# Patient Record
Sex: Female | Born: 1960 | Race: Black or African American | Hispanic: No | Marital: Single | Smoking: Never smoker
Health system: Southern US, Community
[De-identification: ages and names within clinical notes are randomized; demographics above are authoritative.]

## PROBLEM LIST (undated history)

## (undated) DIAGNOSIS — J9621 Acute and chronic respiratory failure with hypoxia: Secondary | ICD-10-CM

## (undated) DIAGNOSIS — Z992 Dependence on renal dialysis: Secondary | ICD-10-CM

## (undated) DIAGNOSIS — G589 Mononeuropathy, unspecified: Secondary | ICD-10-CM

## (undated) DIAGNOSIS — G4730 Sleep apnea, unspecified: Secondary | ICD-10-CM

## (undated) DIAGNOSIS — I10 Essential (primary) hypertension: Secondary | ICD-10-CM

## (undated) DIAGNOSIS — I469 Cardiac arrest, cause unspecified: Secondary | ICD-10-CM

## (undated) DIAGNOSIS — I739 Peripheral vascular disease, unspecified: Secondary | ICD-10-CM

## (undated) DIAGNOSIS — D649 Anemia, unspecified: Secondary | ICD-10-CM

## (undated) DIAGNOSIS — N289 Disorder of kidney and ureter, unspecified: Secondary | ICD-10-CM

## (undated) DIAGNOSIS — J189 Pneumonia, unspecified organism: Secondary | ICD-10-CM

## (undated) DIAGNOSIS — K219 Gastro-esophageal reflux disease without esophagitis: Secondary | ICD-10-CM

## (undated) DIAGNOSIS — J398 Other specified diseases of upper respiratory tract: Secondary | ICD-10-CM

## (undated) DIAGNOSIS — N186 End stage renal disease: Secondary | ICD-10-CM

## (undated) DIAGNOSIS — I5032 Chronic diastolic (congestive) heart failure: Secondary | ICD-10-CM

## (undated) DIAGNOSIS — J449 Chronic obstructive pulmonary disease, unspecified: Secondary | ICD-10-CM

## (undated) DIAGNOSIS — Z972 Presence of dental prosthetic device (complete) (partial): Secondary | ICD-10-CM

## (undated) DIAGNOSIS — I509 Heart failure, unspecified: Secondary | ICD-10-CM

## (undated) DIAGNOSIS — M1990 Unspecified osteoarthritis, unspecified site: Secondary | ICD-10-CM

## (undated) DIAGNOSIS — E785 Hyperlipidemia, unspecified: Secondary | ICD-10-CM

## (undated) DIAGNOSIS — Z9981 Dependence on supplemental oxygen: Secondary | ICD-10-CM

## (undated) HISTORY — DX: Anemia, unspecified: D64.9

## (undated) HISTORY — DX: Essential (primary) hypertension: I10

## (undated) HISTORY — PX: OVARY SURGERY: SHX727

## (undated) HISTORY — DX: Sleep apnea, unspecified: G47.30

## (undated) HISTORY — DX: Mononeuropathy, unspecified: G58.9

## (undated) HISTORY — DX: Unspecified osteoarthritis, unspecified site: M19.90

## (undated) HISTORY — DX: Heart failure, unspecified: I50.9

## (undated) HISTORY — DX: Hyperlipidemia, unspecified: E78.5

## (undated) HISTORY — DX: Peripheral vascular disease, unspecified: I73.9

## (undated) HISTORY — PX: MULTIPLE TOOTH EXTRACTIONS: SHX2053

## (undated) HISTORY — DX: Chronic obstructive pulmonary disease, unspecified: J44.9

## (undated) HISTORY — PX: COLONOSCOPY: SHX174

## (undated) HISTORY — PX: HAND SURGERY: SHX662

## (undated) HISTORY — DX: Morbid (severe) obesity due to excess calories: E66.01

## (undated) HISTORY — DX: Disorder of kidney and ureter, unspecified: N28.9

---

## 2009-05-07 ENCOUNTER — Emergency Department (HOSPITAL_COMMUNITY): Admission: EM | Admit: 2009-05-07 | Discharge: 2009-05-07 | Payer: Self-pay | Admitting: Family Medicine

## 2009-05-11 ENCOUNTER — Inpatient Hospital Stay (HOSPITAL_COMMUNITY): Admission: EM | Admit: 2009-05-11 | Discharge: 2009-05-14 | Payer: Self-pay | Admitting: Emergency Medicine

## 2009-05-12 ENCOUNTER — Encounter (INDEPENDENT_AMBULATORY_CARE_PROVIDER_SITE_OTHER): Payer: Self-pay | Admitting: Internal Medicine

## 2009-06-15 ENCOUNTER — Ambulatory Visit: Payer: Self-pay | Admitting: Internal Medicine

## 2009-07-06 ENCOUNTER — Ambulatory Visit: Payer: Self-pay | Admitting: *Deleted

## 2009-08-21 ENCOUNTER — Emergency Department (HOSPITAL_COMMUNITY): Admission: EM | Admit: 2009-08-21 | Discharge: 2009-08-21 | Payer: Self-pay | Admitting: Family Medicine

## 2009-08-21 ENCOUNTER — Emergency Department (HOSPITAL_COMMUNITY): Admission: EM | Admit: 2009-08-21 | Discharge: 2009-08-21 | Payer: Self-pay | Admitting: Emergency Medicine

## 2009-08-26 ENCOUNTER — Ambulatory Visit: Payer: Self-pay | Admitting: Internal Medicine

## 2009-09-01 ENCOUNTER — Ambulatory Visit (HOSPITAL_COMMUNITY): Admission: RE | Admit: 2009-09-01 | Discharge: 2009-09-01 | Payer: Self-pay | Admitting: Family Medicine

## 2009-09-16 ENCOUNTER — Ambulatory Visit: Payer: Self-pay | Admitting: Internal Medicine

## 2009-09-16 ENCOUNTER — Encounter: Payer: Self-pay | Admitting: Family Medicine

## 2009-09-16 LAB — CONVERTED CEMR LAB
ALT: 10 U/L
AST: 11 U/L
Albumin: 3.7 g/dL
Alkaline Phosphatase: 95 U/L
BUN: 18 mg/dL
CO2: 24 meq/L
Calcium: 9.4 mg/dL
Chloride: 100 meq/L
Cholesterol: 192 mg/dL
Creatinine, Ser: 1.44 mg/dL — ABNORMAL HIGH
Glucose, Bld: 320 mg/dL — ABNORMAL HIGH
HDL: 41 mg/dL
LDL Cholesterol: 123 mg/dL — ABNORMAL HIGH
Potassium: 4.6 meq/L
Sodium: 140 meq/L
Total Bilirubin: 0.4 mg/dL
Total CHOL/HDL Ratio: 4.7
Total Protein: 7 g/dL
Triglycerides: 139 mg/dL
VLDL: 28 mg/dL
Vit D, 25-Hydroxy: 13 ng/mL — ABNORMAL LOW

## 2009-09-25 ENCOUNTER — Ambulatory Visit: Payer: Self-pay | Admitting: Internal Medicine

## 2009-10-08 ENCOUNTER — Encounter (INDEPENDENT_AMBULATORY_CARE_PROVIDER_SITE_OTHER): Payer: Self-pay | Admitting: Adult Health

## 2009-10-08 ENCOUNTER — Ambulatory Visit: Payer: Self-pay | Admitting: Internal Medicine

## 2009-10-08 LAB — CONVERTED CEMR LAB
ALT: 10 units/L (ref 0–35)
AST: 12 units/L (ref 0–37)
Alkaline Phosphatase: 86 units/L (ref 39–117)
BUN: 20 mg/dL (ref 6–23)
CO2: 21 meq/L (ref 19–32)
Potassium: 4.5 meq/L (ref 3.5–5.3)
Total CHOL/HDL Ratio: 4.8
Total Protein: 6.9 g/dL (ref 6.0–8.3)
Triglycerides: 183 mg/dL — ABNORMAL HIGH (ref <150)

## 2009-10-23 ENCOUNTER — Encounter (INDEPENDENT_AMBULATORY_CARE_PROVIDER_SITE_OTHER): Payer: Self-pay | Admitting: Adult Health

## 2009-10-23 ENCOUNTER — Ambulatory Visit: Payer: Self-pay | Admitting: Internal Medicine

## 2009-10-30 ENCOUNTER — Encounter (INDEPENDENT_AMBULATORY_CARE_PROVIDER_SITE_OTHER): Payer: Self-pay | Admitting: Adult Health

## 2009-10-30 ENCOUNTER — Ambulatory Visit: Payer: Self-pay | Admitting: Family Medicine

## 2009-10-30 LAB — CONVERTED CEMR LAB
ALT: 12 units/L (ref 0–35)
AST: 12 units/L (ref 0–37)
Alkaline Phosphatase: 97 units/L (ref 39–117)
BUN: 21 mg/dL (ref 6–23)
Calcium: 9.4 mg/dL (ref 8.4–10.5)
Chloride: 103 meq/L (ref 96–112)
Creatinine, Ser: 1.31 mg/dL — ABNORMAL HIGH (ref 0.40–1.20)
Glucose, Bld: 251 mg/dL — ABNORMAL HIGH (ref 70–99)
Testosterone: 45.98 ng/dL (ref 10–70)
Total Bilirubin: 0.2 mg/dL — ABNORMAL LOW (ref 0.3–1.2)

## 2009-11-05 ENCOUNTER — Encounter (INDEPENDENT_AMBULATORY_CARE_PROVIDER_SITE_OTHER): Payer: Self-pay | Admitting: Adult Health

## 2009-11-05 ENCOUNTER — Ambulatory Visit: Payer: Self-pay | Admitting: Internal Medicine

## 2009-11-05 LAB — CONVERTED CEMR LAB
Albumin: 3.6 g/dL (ref 3.5–5.2)
Calcium, Total (PTH): 9 mg/dL (ref 8.4–10.5)
Calcium: 9 mg/dL (ref 8.4–10.5)
Creatinine, Ser: 1.46 mg/dL — ABNORMAL HIGH (ref 0.40–1.20)
Glucose, Bld: 294 mg/dL — ABNORMAL HIGH (ref 70–99)
PTH: 101.6 pg/mL — ABNORMAL HIGH (ref 14.0–72.0)
Phosphorus: 4.1 mg/dL (ref 2.3–4.6)
Sodium: 139 meq/L (ref 135–145)

## 2009-11-11 ENCOUNTER — Ambulatory Visit: Payer: Self-pay | Admitting: Internal Medicine

## 2009-12-16 ENCOUNTER — Ambulatory Visit: Payer: Self-pay | Admitting: Family Medicine

## 2009-12-16 ENCOUNTER — Encounter (INDEPENDENT_AMBULATORY_CARE_PROVIDER_SITE_OTHER): Payer: Self-pay | Admitting: Adult Health

## 2009-12-16 LAB — CONVERTED CEMR LAB
Albumin, U: DETECTED %
Albumin: 3.7 g/dL (ref 3.5–5.2)
Alpha 1, Urine: DETECTED % — AB
BUN: 18 mg/dL (ref 6–23)
Collection Interval-CRCL: 24 hr
Creatinine, Ser: 1.45 mg/dL — ABNORMAL HIGH (ref 0.40–1.20)
Free Lambda Lt Chains,Ur: 1.61 mg/dL — ABNORMAL HIGH (ref 0.08–1.01)
Pro B Natriuretic peptide (BNP): 17.3 pg/mL (ref 0.0–100.0)
Protein, Ur: 1980 mg/24hr — ABNORMAL HIGH (ref 50–100)
Time: 24
Total Protein, Urine-Ur/day: 1676 mg/24hr — ABNORMAL HIGH (ref 10–140)
Total Protein, Urine: 76.2 mg/dL
Volume, Urine: 2200 mL

## 2009-12-25 ENCOUNTER — Ambulatory Visit: Payer: Self-pay | Admitting: Internal Medicine

## 2009-12-25 ENCOUNTER — Encounter (INDEPENDENT_AMBULATORY_CARE_PROVIDER_SITE_OTHER): Payer: Self-pay | Admitting: Adult Health

## 2009-12-25 LAB — CONVERTED CEMR LAB
Albumin: 3.6 g/dL (ref 3.5–5.2)
Alpha-1-Globulin: 4.8 % (ref 2.9–4.9)
Anti Nuclear Antibody(ANA): NEGATIVE
BUN: 26 mg/dL — ABNORMAL HIGH (ref 6–23)
Basophils Relative: 0 % (ref 0–1)
Beta Globulin: 7.5 % — ABNORMAL HIGH (ref 4.7–7.2)
Calcium, Total (PTH): 8.9 mg/dL (ref 8.4–10.5)
Calcium: 8.9 mg/dL (ref 8.4–10.5)
Chloride: 102 meq/L (ref 96–112)
Eosinophils Absolute: 0.1 10*3/uL (ref 0.0–0.7)
Eosinophils Relative: 2 % (ref 0–5)
Hemoglobin: 11 g/dL — ABNORMAL LOW (ref 12.0–15.0)
IgA: 359 mg/dL (ref 68–378)
IgG (Immunoglobin G), Serum: 1190 mg/dL (ref 694–1618)
Iron: 39 ug/dL — ABNORMAL LOW (ref 42–145)
MCHC: 29.2 g/dL — ABNORMAL LOW (ref 30.0–36.0)
Monocytes Absolute: 0.4 10*3/uL (ref 0.1–1.0)
Neutrophils Relative %: 63 % (ref 43–77)
Platelets: 277 10*3/uL (ref 150–400)
Potassium: 4.2 meq/L (ref 3.5–5.3)
RBC: 4.63 M/uL (ref 3.87–5.11)
Saturation Ratios: 14 % — ABNORMAL LOW (ref 20–55)
TIBC: 278 ug/dL (ref 250–470)
UIBC: 239 ug/dL
ds DNA Ab: <1 (ref <5)

## 2010-01-06 ENCOUNTER — Encounter (HOSPITAL_COMMUNITY): Admission: RE | Admit: 2010-01-06 | Discharge: 2010-03-29 | Payer: Self-pay | Admitting: Nephrology

## 2010-01-21 ENCOUNTER — Emergency Department (HOSPITAL_COMMUNITY): Admission: EM | Admit: 2010-01-21 | Discharge: 2010-01-21 | Payer: Self-pay | Admitting: Family Medicine

## 2010-02-05 ENCOUNTER — Ambulatory Visit: Payer: Self-pay | Admitting: Internal Medicine

## 2010-03-19 ENCOUNTER — Ambulatory Visit: Payer: Self-pay | Admitting: Internal Medicine

## 2010-03-19 LAB — CONVERTED CEMR LAB
Basophils Absolute: 0 10*3/uL (ref 0.0–0.1)
Basophils Relative: 0 % (ref 0–1)
Calcium: 9.3 mg/dL (ref 8.4–10.5)
Chloride: 103 meq/L (ref 96–112)
Eosinophils Relative: 1 % (ref 0–5)
Ferritin: 172 ng/mL (ref 10–291)
Glucose, Bld: 238 mg/dL — ABNORMAL HIGH (ref 70–99)
MCHC: 28.6 g/dL — ABNORMAL LOW (ref 30.0–36.0)
Monocytes Absolute: 0.4 10*3/uL (ref 0.1–1.0)
Monocytes Relative: 5 % (ref 3–12)
Platelets: 256 10*3/uL (ref 150–400)
RBC: 4.43 M/uL (ref 3.87–5.11)
RDW: 16.9 % — ABNORMAL HIGH (ref 11.5–15.5)
Saturation Ratios: 18 % — ABNORMAL LOW (ref 20–55)
Sodium: 143 meq/L (ref 135–145)
TIBC: 268 ug/dL (ref 250–470)
UIBC: 221 ug/dL
WBC: 8.3 10*3/uL (ref 4.0–10.5)

## 2010-04-16 ENCOUNTER — Ambulatory Visit: Payer: Self-pay | Admitting: Internal Medicine

## 2010-06-16 ENCOUNTER — Ambulatory Visit: Payer: Self-pay | Admitting: Internal Medicine

## 2010-06-16 LAB — CONVERTED CEMR LAB
Basophils Absolute: 0 10*3/uL (ref 0.0–0.1)
Basophils Relative: 0 % (ref 0–1)
Eosinophils Absolute: 0.1 10*3/uL (ref 0.0–0.7)
Eosinophils Relative: 2 % (ref 0–5)
Hemoglobin: 11.2 g/dL — ABNORMAL LOW (ref 12.0–15.0)
Iron: 51 ug/dL (ref 42–145)
Lymphocytes Relative: 37 % (ref 12–46)
Lymphs Abs: 3.5 10*3/uL (ref 0.7–4.0)
MCHC: 30.1 g/dL (ref 30.0–36.0)
Neutro Abs: 5.3 10*3/uL (ref 1.7–7.7)
RBC: 4.57 M/uL (ref 3.87–5.11)
RDW: 17 % — ABNORMAL HIGH (ref 11.5–15.5)
Retic Ct Pct: 2 % (ref 0.4–3.1)
TIBC: 272 ug/dL (ref 250–470)
UIBC: 221 ug/dL

## 2010-07-15 ENCOUNTER — Ambulatory Visit: Payer: Self-pay | Admitting: Internal Medicine

## 2010-12-13 ENCOUNTER — Emergency Department (HOSPITAL_COMMUNITY)
Admission: EM | Admit: 2010-12-13 | Discharge: 2010-12-13 | Disposition: A | Discharge status: Other Acute Inpatient Hospital | Payer: Self-pay | Source: Home / Self Care | Admitting: Emergency Medicine

## 2010-12-13 ENCOUNTER — Inpatient Hospital Stay (HOSPITAL_COMMUNITY)
Admission: EM | Admit: 2010-12-13 | Discharge: 2010-12-17 | Payer: Self-pay | Source: Home / Self Care | Attending: Internal Medicine | Admitting: Internal Medicine

## 2010-12-14 ENCOUNTER — Encounter (INDEPENDENT_AMBULATORY_CARE_PROVIDER_SITE_OTHER): Payer: Self-pay | Admitting: Internal Medicine

## 2010-12-23 ENCOUNTER — Encounter (INDEPENDENT_AMBULATORY_CARE_PROVIDER_SITE_OTHER): Payer: Self-pay | Admitting: Family Medicine

## 2010-12-23 LAB — CONVERTED CEMR LAB
Albumin: 3.7 g/dL (ref 3.5–5.2)
Alkaline Phosphatase: 91 units/L (ref 39–117)
CO2: 34 meq/L — ABNORMAL HIGH (ref 19–32)
Calcium: 9.7 mg/dL (ref 8.4–10.5)
HDL: 32 mg/dL — ABNORMAL LOW (ref >39)
LDL Cholesterol: 182 mg/dL — ABNORMAL HIGH (ref 0–99)
Sodium: 141 meq/L (ref 135–145)
Total CHOL/HDL Ratio: 8
Total Protein: 7.3 g/dL (ref 6.0–8.3)
Triglycerides: 207 mg/dL — ABNORMAL HIGH (ref <150)
VLDL: 41 mg/dL — ABNORMAL HIGH (ref 0–40)

## 2011-01-31 ENCOUNTER — Encounter (INDEPENDENT_AMBULATORY_CARE_PROVIDER_SITE_OTHER): Payer: Self-pay | Admitting: *Deleted

## 2011-01-31 LAB — CONVERTED CEMR LAB
Calcium, Total (PTH): 9.4 mg/dL (ref 8.4–10.5)
Magnesium: 1.8 mg/dL (ref 1.5–2.5)
PTH: 99.8 pg/mL — ABNORMAL HIGH (ref 14.0–72.0)
Phosphorus: 3.9 mg/dL (ref 2.3–4.6)

## 2011-02-28 LAB — GLUCOSE, CAPILLARY
Glucose-Capillary: 158 mg/dL — ABNORMAL HIGH (ref 70–99)
Glucose-Capillary: 180 mg/dL — ABNORMAL HIGH (ref 70–99)
Glucose-Capillary: 193 mg/dL — ABNORMAL HIGH (ref 70–99)
Glucose-Capillary: 206 mg/dL — ABNORMAL HIGH (ref 70–99)
Glucose-Capillary: 211 mg/dL — ABNORMAL HIGH (ref 70–99)
Glucose-Capillary: 224 mg/dL — ABNORMAL HIGH (ref 70–99)
Glucose-Capillary: 228 mg/dL — ABNORMAL HIGH (ref 70–99)
Glucose-Capillary: 233 mg/dL — ABNORMAL HIGH (ref 70–99)

## 2011-02-28 LAB — COMPREHENSIVE METABOLIC PANEL
AST: 25 U/L (ref 0–37)
Albumin: 2.9 g/dL — ABNORMAL LOW (ref 3.5–5.2)
BUN: 16 mg/dL (ref 6–23)
CO2: 30 mEq/L (ref 19–32)
Calcium: 9 mg/dL (ref 8.4–10.5)
Chloride: 102 mEq/L (ref 96–112)
GFR calc Af Amer: 41 mL/min — ABNORMAL LOW (ref >60)
GFR calc non Af Amer: 34 mL/min — ABNORMAL LOW (ref >60)
Glucose, Bld: 217 mg/dL — ABNORMAL HIGH (ref 70–99)
Potassium: 3.5 mEq/L (ref 3.5–5.1)
Sodium: 140 mEq/L (ref 135–145)

## 2011-02-28 LAB — CARDIAC PANEL(CRET KIN+CKTOT+MB+TROPI)
CK, MB: 0.7 ng/mL (ref 0.3–4.0)
CK, MB: 0.7 ng/mL (ref 0.3–4.0)
CK, MB: 1.1 ng/mL (ref 0.3–4.0)
CK, MB: 1.2 ng/mL (ref 0.3–4.0)
Relative Index: INVALID (ref 0.0–2.5)
Relative Index: INVALID (ref 0.0–2.5)
Total CK: 79 U/L (ref 7–177)
Total CK: 80 U/L (ref 7–177)
Troponin I: 0.04 ng/mL (ref 0.00–0.06)

## 2011-02-28 LAB — CBC
Hemoglobin: 10.4 g/dL — ABNORMAL LOW (ref 12.0–15.0)
MCHC: 28.9 g/dL — ABNORMAL LOW (ref 30.0–36.0)
Platelets: 285 10*3/uL (ref 150–400)
RDW: 16.6 % — ABNORMAL HIGH (ref 11.5–15.5)

## 2011-02-28 LAB — CK TOTAL AND CKMB (NOT AT ARMC): CK, MB: 1 ng/mL (ref 0.3–4.0)

## 2011-02-28 LAB — BASIC METABOLIC PANEL
BUN: 20 mg/dL (ref 6–23)
Calcium: 9.2 mg/dL (ref 8.4–10.5)
Chloride: 98 mEq/L (ref 96–112)
GFR calc non Af Amer: 30 mL/min — ABNORMAL LOW (ref >60)
Potassium: 4.1 mEq/L (ref 3.5–5.1)
Potassium: 4.4 mEq/L (ref 3.5–5.1)
Sodium: 142 mEq/L (ref 135–145)

## 2011-02-28 LAB — TSH: TSH: 3.921 u[IU]/mL (ref 0.350–4.500)

## 2011-02-28 LAB — HEMOGLOBIN A1C: Hgb A1c MFr Bld: 10.5 % — ABNORMAL HIGH (ref <5.7)

## 2011-02-28 LAB — POCT I-STAT, CHEM 8
BUN: 19 mg/dL (ref 6–23)
Chloride: 105 mEq/L (ref 96–112)
Creatinine, Ser: 1.6 mg/dL — ABNORMAL HIGH (ref 0.4–1.2)
Hemoglobin: 12.6 g/dL (ref 12.0–15.0)
Sodium: 144 mEq/L (ref 135–145)

## 2011-03-09 LAB — GLUCOSE, CAPILLARY: Glucose-Capillary: 125 mg/dL — ABNORMAL HIGH (ref 70–99)

## 2011-03-25 LAB — CBC
HCT: 34 % — ABNORMAL LOW (ref 36.0–46.0)
MCV: 78.1 fL (ref 78.0–100.0)
Platelets: 246 10*3/uL (ref 150–400)
WBC: 9.3 10*3/uL (ref 4.0–10.5)

## 2011-03-25 LAB — COMPREHENSIVE METABOLIC PANEL
Albumin: 3 g/dL — ABNORMAL LOW (ref 3.5–5.2)
BUN: 15 mg/dL (ref 6–23)
Chloride: 100 mEq/L (ref 96–112)
Creatinine, Ser: 1.41 mg/dL — ABNORMAL HIGH (ref 0.4–1.2)
GFR calc non Af Amer: 39 mL/min — ABNORMAL LOW (ref >60)
Glucose, Bld: 221 mg/dL — ABNORMAL HIGH (ref 70–99)
Total Bilirubin: 0.6 mg/dL (ref 0.3–1.2)

## 2011-03-25 LAB — LIPASE, BLOOD: Lipase: 43 U/L (ref 11–59)

## 2011-03-25 LAB — DIFFERENTIAL
Basophils Absolute: 0 10*3/uL (ref 0.0–0.1)
Lymphocytes Relative: 23 % (ref 12–46)
Monocytes Absolute: 0.5 10*3/uL (ref 0.1–1.0)
Neutro Abs: 6.5 10*3/uL (ref 1.7–7.7)
Neutrophils Relative %: 70 % (ref 43–77)

## 2011-03-25 LAB — URINALYSIS, ROUTINE W REFLEX MICROSCOPIC
Leukocytes, UA: NEGATIVE
Nitrite: NEGATIVE
Specific Gravity, Urine: 1.024 (ref 1.005–1.030)
pH: 5.5 (ref 5.0–8.0)

## 2011-03-25 LAB — URINE MICROSCOPIC-ADD ON

## 2011-03-25 LAB — POCT CARDIAC MARKERS: Troponin i, poc: <0.05 ng/mL (ref 0.00–0.09)

## 2011-03-25 LAB — POCT PREGNANCY, URINE: Preg Test, Ur: NEGATIVE

## 2011-03-25 LAB — LACTIC ACID, PLASMA: Lactic Acid, Venous: 0.9 mmol/L (ref 0.5–2.2)

## 2011-03-29 LAB — FERRITIN: Ferritin: 135 ng/mL (ref 10–291)

## 2011-03-29 LAB — URINALYSIS, ROUTINE W REFLEX MICROSCOPIC
Leukocytes, UA: NEGATIVE
Protein, ur: 100 mg/dL — AB
Urobilinogen, UA: 0.2 mg/dL (ref 0.0–1.0)

## 2011-03-29 LAB — RETICULOCYTES
RBC.: 4.39 MIL/uL (ref 3.87–5.11)
Retic Ct Pct: 1.8 % (ref 0.4–3.1)

## 2011-03-29 LAB — BRAIN NATRIURETIC PEPTIDE: Pro B Natriuretic peptide (BNP): <30 pg/mL (ref 0.0–100.0)

## 2011-03-29 LAB — DIFFERENTIAL
Basophils Absolute: 0 10*3/uL (ref 0.0–0.1)
Basophils Relative: 0 % (ref 0–1)
Monocytes Relative: 5 % (ref 3–12)
Neutro Abs: 5.3 10*3/uL (ref 1.7–7.7)
Neutrophils Relative %: 62 % (ref 43–77)

## 2011-03-29 LAB — COMPREHENSIVE METABOLIC PANEL
Alkaline Phosphatase: 100 U/L (ref 39–117)
BUN: 17 mg/dL (ref 6–23)
CO2: 29 mEq/L (ref 19–32)
GFR calc non Af Amer: 39 mL/min — ABNORMAL LOW (ref >60)
Glucose, Bld: 509 mg/dL (ref 70–99)
Potassium: 4.4 mEq/L (ref 3.5–5.1)
Total Bilirubin: 0.1 mg/dL — ABNORMAL LOW (ref 0.3–1.2)
Total Protein: 6.7 g/dL (ref 6.0–8.3)

## 2011-03-29 LAB — BASIC METABOLIC PANEL
CO2: 30 mEq/L (ref 19–32)
Calcium: 9.4 mg/dL (ref 8.4–10.5)
Creatinine, Ser: 1.27 mg/dL — ABNORMAL HIGH (ref 0.4–1.2)
GFR calc Af Amer: 54 mL/min — ABNORMAL LOW (ref >60)
Glucose, Bld: 284 mg/dL — ABNORMAL HIGH (ref 70–99)

## 2011-03-29 LAB — CK TOTAL AND CKMB (NOT AT ARMC)
CK, MB: 1 ng/mL (ref 0.3–4.0)
Relative Index: INVALID (ref 0.0–2.5)
Relative Index: INVALID (ref 0.0–2.5)
Total CK: 61 U/L (ref 7–177)
Total CK: 74 U/L (ref 7–177)

## 2011-03-29 LAB — GLUCOSE, CAPILLARY
Glucose-Capillary: 174 mg/dL — ABNORMAL HIGH (ref 70–99)
Glucose-Capillary: 186 mg/dL — ABNORMAL HIGH (ref 70–99)
Glucose-Capillary: 189 mg/dL — ABNORMAL HIGH (ref 70–99)
Glucose-Capillary: 190 mg/dL — ABNORMAL HIGH (ref 70–99)
Glucose-Capillary: 191 mg/dL — ABNORMAL HIGH (ref 70–99)
Glucose-Capillary: 226 mg/dL — ABNORMAL HIGH (ref 70–99)
Glucose-Capillary: 262 mg/dL — ABNORMAL HIGH (ref 70–99)
Glucose-Capillary: 296 mg/dL — ABNORMAL HIGH (ref 70–99)
Glucose-Capillary: 296 mg/dL — ABNORMAL HIGH (ref 70–99)
Glucose-Capillary: 473 mg/dL — ABNORMAL HIGH (ref 70–99)

## 2011-03-29 LAB — CBC
HCT: 34.4 % — ABNORMAL LOW (ref 36.0–46.0)
Hemoglobin: 11 g/dL — ABNORMAL LOW (ref 12.0–15.0)
MCHC: 32.6 g/dL (ref 30.0–36.0)
RBC: 4.14 MIL/uL (ref 3.87–5.11)
RDW: 15.1 % (ref 11.5–15.5)
RDW: 15.1 % (ref 11.5–15.5)

## 2011-03-29 LAB — TROPONIN I
Troponin I: 0.02 ng/mL (ref 0.00–0.06)
Troponin I: 0.09 ng/mL — ABNORMAL HIGH (ref 0.00–0.06)
Troponin I: 0.13 ng/mL — ABNORMAL HIGH (ref 0.00–0.06)

## 2011-03-29 LAB — IRON AND TIBC
Iron: 40 ug/dL — ABNORMAL LOW (ref 42–135)
TIBC: 275 ug/dL (ref 250–470)
UIBC: 229 ug/dL

## 2011-03-29 LAB — T4, FREE: Free T4: 0.82 ng/dL (ref 0.80–1.80)

## 2011-03-29 LAB — LIPID PANEL: Cholesterol: 209 mg/dL — ABNORMAL HIGH (ref 0–200)

## 2011-03-29 LAB — HEMOGLOBIN A1C
Hgb A1c MFr Bld: 12 % — ABNORMAL HIGH (ref 4.6–6.1)
Mean Plasma Glucose: 298 mg/dL

## 2011-03-29 LAB — URINE MICROSCOPIC-ADD ON

## 2011-03-29 LAB — APTT: aPTT: 27 seconds (ref 24–37)

## 2011-04-05 ENCOUNTER — Encounter: Payer: Self-pay | Admitting: Pulmonary Disease

## 2011-04-05 ENCOUNTER — Ambulatory Visit (INDEPENDENT_AMBULATORY_CARE_PROVIDER_SITE_OTHER): Payer: Self-pay | Admitting: Pulmonary Disease

## 2011-04-05 VITALS — BP 152/88 | HR 73 | Temp 98.8°F | Ht 62.0 in | Wt 322.4 lb

## 2011-04-05 DIAGNOSIS — G4733 Obstructive sleep apnea (adult) (pediatric): Secondary | ICD-10-CM

## 2011-04-05 NOTE — Progress Notes (Signed)
  Subjective:    Patient ID: Catherine Williams, female    DOB: 27-Jun-1961, 50 y.o.   MRN: [id number]  HPI The pt is a 50y/o female who I have been asked to see for possible osa.  History significant for: -snoring, witnessed apneas per family, choking arousals -observed at hospital to have apneas -nonrestorative sleep with significant daytime sleepiness with inactivity. -+sleepiness with driving. -currently on oxygen at hs -epworth 19 today, weight up 50 pounds in 2 yrs.   Please see sleep questionnaire in note for sleep hygiene.    Review of Systems  Constitutional: Negative for fever and unexpected weight change.  HENT: Positive for congestion, sore throat and trouble swallowing. Negative for ear pain, nosebleeds, rhinorrhea, sneezing, dental problem, postnasal drip and sinus pressure.   Eyes: Negative for redness and itching.  Respiratory: Negative for cough, chest tightness, shortness of breath and wheezing.   Cardiovascular: Positive for chest pain. Negative for palpitations and leg swelling.  Gastrointestinal: Negative for nausea and vomiting.  Genitourinary: Negative for dysuria.  Musculoskeletal: Negative for joint swelling.  Skin: Negative for rash.  Neurological: Positive for headaches.  Hematological: Does not bruise/bleed easily.  Psychiatric/Behavioral: Negative for dysphoric mood. The patient is not nervous/anxious.        Objective:   Physical Exam Constitutional:  Morbidly obese, no acute distress  HENT:  Nares  without discharge, large turbs, deviated septum to left with near obstruction  Oropharynx with huge tongue, soft tissue redundancy posteriorly, elongation of soft palate and uvula  Eyes:  Perrla, eomi, no scleral icterus  Neck:  No JVD, no TMG  Cardiovascular:  Normal rate, regular rhythm, no rubs or gallops.  No murmurs        Intact distal pulses  Pulmonary :  Decreased bs due to poor inspiratory effort, no stridor or respiratory distress   No rales,  rhonchi, or wheezing  Abdominal:  Soft, nondistended, bowel sounds present.  No tenderness noted.   Musculoskeletal:  2+ LE edema  Lymph Nodes:  No cervical lymphadenopathy noted  Skin:  No cyanosis noted  Neurologic:  Appears very sleepy, appropriate, moves all 4 extremities without obvious deficit.         Assessment & Plan:

## 2011-04-05 NOTE — Patient Instructions (Signed)
Will setup for a sleep study.  Will arrange followup once results are available Work on weight loss

## 2011-04-11 ENCOUNTER — Encounter: Payer: Self-pay | Admitting: Pulmonary Disease

## 2011-04-11 NOTE — Assessment & Plan Note (Signed)
The pt's history is very suggestive of osa, and she has gained very significant weight as well.  I have had a long discussion with the pt about sleep apnea, including its impact on QOL and CV health.  I think she needs a sleep study for diagnosis, and she is agreeable.

## 2011-05-03 NOTE — Cardiovascular Report (Signed)
NAMEATZIRY, MARKEY NO.:  [id number]   MEDICAL RECORD NO.:  [id number]          PATIENT TYPE:  INP   LOCATION:  2029                         FACILITY:  [facility]   PHYSICIAN:  Thayer Headings, M.D. DATE OF BIRTH:  11-23-1961   DATE OF PROCEDURE:  05/13/2009  DATE OF DISCHARGE:                            CARDIAC CATHETERIZATION   Catherine Williams is a 50 year old female with a history of uncontrolled  diabetes mellitus.  She has a history of hypertension, hyperlipidemia,  and morbid obesity.  She moved down from [place], [place], and has not  been to see the doctor since she moved the last year.  She has had  markedly elevated glucose levels and was admitted with chest pain and  shortness of breath and a glucose of greater than 500.  She had a mild  bump in her troponin levels.  She was referred for heart catheterization  based on that.   The procedure was left heart catheterization with coronary angiography.   The right femoral artery was accessed with some difficulty.  We had to  use a long Smart needle for access.   All catheters were exchanged out over a guidewire.  The long sheath was  used.   HEMODYNAMIC:  LV pressure is 185/18.  The aortic pressure is 186/88.   Left main:  Left main is normal.   The left anterior descending artery has mild-to-moderate irregularities.  There is a long 20-30% stenosis in the mid vessel.  There is brisk flow  through the segment of the artery.   The first and second diagonal artery are relatively small, but are  normal.   The left circumflex artery is a large vessel.  There is a 20% stenosis  in the mid vessel.  The obtuse marginal arteries are normal.   The right coronary artery is relatively small, but is dominant.  There  is a 20-30% mid stenosis.  The posterior descending artery and the  posterolateral segment arteries are unremarkable.   The left ventriculogram was performed in a 30 RAO position.  It reveals  normal left ventricular systolic function.  The ejection fraction is 60-  65%.  There is no mitral regurgitation.   COMPLICATIONS:  None.   CONCLUSIONS:  1. Minor coronary artery regularities.  2. Normal left ventricular systolic function.  She needs aggressive      therapy for her diabetes as well as her blood pressure.  She also      needs an aggressive weight loss program.      Thayer Headings, M.D.  Electronically Signed     PJN/MEDQ  D:  05/13/2009  T:  05/13/2009  Job:  [id number]   cc:   Rexene Alberts, M.D.

## 2011-05-03 NOTE — Consult Note (Signed)
NAMEMarland Kitchen  Catherine Williams, Catherine Williams NO.:  [id number]   MEDICAL RECORD NO.:  [id number]          PATIENT TYPE:  INP   LOCATION:  2029                         FACILITY:  [facility]   PHYSICIAN:  Thayer Headings, M.D. DATE OF BIRTH:  June 14, 1961   DATE OF CONSULTATION:  DATE OF DISCHARGE:                                 CONSULTATION   Catherine Williams is a 50 year old female with a history of  hypercholesterolemia, diabetes mellitus, hypertension, morbid obesity,  and chest pain.  She moved down from [place], [place], about a year  ago and has not been to see the doctor since that time.  She had about a  year's worth of medicine and only ran out a month ago.   The patient has had some intermittent episodes of chest pain in the  past.  In fact, she had a fairly complete workup before she left [place] consisting of a stress test which was unremarkable.  She had an  echocardiogram which was also fairly normal.   The patient has had some intermittent chest pain since she has been down  here.  Probably 3 weeks ago, she had severe chest pain when she was  climbing the stairs.  She typically stops to rest when she climbs the  steps, but this time she did not stop.  She had severe chest tightness.  It radiates out to her arm and was associated with shortness of breath.  There was some diaphoresis.  There was no nausea or vomiting.  Pain  lasted for several minutes and was finally relieved with rest.  She has  not had any severe episodes until last night when she presented with  severe chest pain.  She was also found to be markedly hyperglycemic with  the glucose levels in the 500 range.  She was admitted for further  evaluation.   Her current medications include:  1. Amlodipine 10 mg a day.  2. Glipizide 10 mg twice a day.  3. Lisinopril 20 mg twice a day.  4. Ibuprofen as needed.  5. Metformin 1000 mg twice a day.  6. Lipitor 20 mg at bedtime.   Of note is that the patient has not  taken these for the past month or  so.   ALLERGIES:  None.   SOCIAL HISTORY:  The patient does not drink and does not smoke.   Family history is significant for coronary artery disease, diabetes, and  stroke.   REVIEW OF SYSTEMS:  As noted in the HPI.  She denies any PND or  orthopnea.  She does have some chronic shortness of breath.  She is  quite overweight and has problems related to that.  She has not had any  blood in her urine, blood in her stool.  She denies any cough or cold  symptoms.  She denies any heat or cold intolerance, weight gain, or  weight loss.  All other systems are reviewed and are negative.   On exam, she is a morbidly obese black female in no acute distress.  She  is alert and oriented x3,  and her mood and affect are normal.  Temperature is 98.6.  Her heart rate is 92.  Her blood pressure is  162/91.  Her O2 saturation is 93%.   Her HEENT exam reveals that her sclerae are nonicteric.  Her mucous  membranes are moist.  Her neck is supple.  Carotids are 2+.  There is no  JVD.  Her lungs are clear.  Heart regular rate, S1 and S2.  Her heart  sounds are fairly distant.  Her abdominal exam is morbidly obese.  There  is no hepatosplenomegaly.  There is no guarding or rebound.   Extremities have no edema.  There is no rash.  Her pulses are quite deep  and in fact are very difficult to feel.  There is no clubbing or  cyanosis.   Her neuro exam is nonfocal.  Gait is nonfocal.   Her EKG reveals sinus tachycardia.  She has no ST or T-wave changes.  She does have very poor R-wave progression which I suspect is due to  lead placement.   Her laboratory data reveals mild elevation of her troponin level, the  troponin of 0.13.  Her CPK is 86 with CPK-MB of 2.0.   Her glucose has been as high as in the 500 range.   Her creatinine this morning is 1.27.  Sodium is 140, potassium is 4.7.   Her iron is 40 which is slightly low.  Her percent saturation is 15%.   [name] presents with episodes of chest discomfort and mild  elevations of her troponin level.  She has numerous risk factors for  coronary artery disease including poorly controlled diabetes mellitus,  hypertension, hyperlipidemia, and a family history.  Given the  complexity of this situation and her severe risk factors as well as the  elevated troponin level, we have decided to refer her for heart  catheterization.  We discussed the risks, benefits, and options of heart  catheterization.  She understands and agrees to proceed.  We will  schedule the heart catheterization for tomorrow morning.  All of her  other medical problems are relatively stable and will be addressed by  Dr. Caryn Section.      Thayer Headings, M.D.  Electronically Signed     PJN/MEDQ  D:  05/12/2009  T:  05/13/2009  Job:  [id number]   cc:   Rexene Alberts, M.D.

## 2011-05-03 NOTE — H&P (Signed)
Catherine Williams, Catherine Williams NO.:  [id number]   MEDICAL RECORD NO.:  [id number]          PATIENT TYPE:  INP   LOCATION:  2029                         FACILITY:  [facility]   PHYSICIAN:  Karlyn Agee, M.D. DATE OF BIRTH:  September 10, 1961   DATE OF ADMISSION:  05/10/2009  DATE OF DISCHARGE:                              HISTORY & PHYSICAL   PRIMARY CARE PHYSICIAN:  Unassigned.   CHIEF COMPLAINT:  Chest pain.   HISTORY OF PRESENT ILLNESS:  This is a morbidly obese African American  lady with a history of diabetes, hypertension and possible obstructive  sleep apnea, who relocated from [place] about a year ago.  Was given a  prescription for about a year's supply of medication and has had no  medical followup since arrival in the [place] area.  The patient  comes to the hospital tonight complaining of a sudden onset of chest  pain radiating down to the left flank and down the left upper extremity.  There are no associated symptoms of nausea, vomiting or diaphoresis.  She does not usually have dyspnea on exertion when walking on a level  but she does have dyspnea with climbing stairs.  She was advised in  [place] to do a sleep study for sleep apnea but left before she had  gotten that looked after.  The patient ran out of most of her  medications about a month ago and has been having trouble since.  She  has no fever, cough, cold, dizziness nor palpitations but she has been  having frequency and dysuria.   PAST MEDICAL HISTORY:  1. Diabetes.  2. Hypertension.  3. Hyperlipidemia.  4. Morbid obesity.  5. Asthma.  6. Anemia.   MEDICATIONS:  1. Lipitor 20 mg at bedtime.  2. Amlodipine 10 mg daily.  She ran out about a month ago.  3. Lisinopril 20 mg twice daily.  She continues to take that.  4. Glipizide 10 mg twice daily.  Ran out 3 days ago.  5. Metformin 1000 mg twice daily.  Out x3 days.  6. Skelaxin 800 mg 3 times daily p.r.n.  7. Ibuprofen 800 mg 3 times daily  p.r.n.  8. Mucinex 600 mg twice daily p.r.n.   ALLERGIES:  No known drug allergies.   SOCIAL HISTORY:  Denies tobacco, alcohol or illicit drug use.  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   FAMILY HISTORY:  Significant for coronary artery disease, cancers,  diabetes and strokes.   REVIEW OF SYSTEMS:  Other than noted above, significant only for  episodic epigastric pain and dyspepsia.  Otherwise a 10-point review of  systems is unremarkable.   PHYSICAL EXAMINATION:  A morbidly obese African American lady.  She  gives her height as 5 feet 2 inches and her weight as 336 pounds.  VITAL SIGNS:  Her temperature is 98.3, pulse 98, respirations 20, blood  pressure initially 163/82, 126/45 after clonidine.  Pupils are round and equal.  Mucous membranes pink and anicteric.  She  wears dentures.  She does have a whitish discharge in her mouth,  possibly some Candida.  She has no cervical lymphadenopathy or thyromegaly.  CHEST:  Clear to auscultation bilaterally without crackles, rubs or  rhonchi.  CARDIOVASCULAR SYSTEM:  Regular rhythm without murmur, rubs or gallops.  ABDOMEN:  Markedly obese but soft.  She has epigastric tenderness.  There are no masses.  She has no intertriginous Candida.  EXTREMITIES:  Without edema.  She has 2+ dorsalis pedis pulses  bilaterally.  CENTRAL NERVOUS SYSTEM:  Cranial nerves II-XII are grossly intact and  she has no focal neurologic deficit.   LABORATORY DATA:  White count is 8.4, hemoglobin is 11, MCV 79,  platelets 228.  She had a normal differential on her white count.  Serum  chemistry, complete metabolic panel:  Sodium [id number], potassium 4.4,  chloride 99, CO2 29, glucose is 509, BUN 17, creatinine 1.4, calcium  9.4, albumin 2.8, her liver function is otherwise unremarkable.  Her  cardiac enzymes are completely normal with undetectable troponins.  Her  beta-type natriuretic peptide is undetectable.  Urinalysis:  Her urine  specific gravity is 1.029.  She  has greater than 1000 glucose, protein  is 100 mg/dL, negative for nitrite or leukocyte esterase.  Urine  microscopy, 7-10 red cells.  EKG shows left ventricular hypertrophy with  evidence of strain.   ASSESSMENT:  1. Morbid obesity.  2. Diabetes mellitus type 2, uncontrolled.  3. Diabetic neuropathy.  4. Hypertension, uncontrolled.  5. Atypical chest pain.  6. Possible obstructive sleep apnea.   PLAN:  1. We will admit this lady for serial cardiac enzymes.  We will place      her on a Glucommander for blood sugar control and we will continue      glipizide.  We will hold lisinopril and metformin since she is in a      degree of renal insufficiency but will probably restart lisinopril      prior to discharge and metformin depending on her serum creatinine.  2. The patient will likely need a primary care physician at discharge      in order to facilitate outpatient workup of her multiple medical      problems.      Karlyn Agee, M.D.  Electronically Signed     LC/MEDQ  D:  05/11/2009  T:  05/11/2009  Job:  [id number]

## 2011-05-03 NOTE — Discharge Summary (Signed)
[name], [name] NO.:  [id number]   MEDICAL RECORD NO.:  [id number]          PATIENT TYPE:  INP   LOCATION:  2029                         FACILITY:  [facility]   PHYSICIAN:  Jimmey Ralph, MD  DATE OF BIRTH:  [birth year]   DATE OF ADMISSION:  05/10/2009  DATE OF DISCHARGE:  05/14/2009                               DISCHARGE SUMMARY   PRIMARY CARE PHYSICIAN:  Unassigned.  The patient will follow up with  [facility].   CARDIOLOGIST:  Thayer Headings, MD   DISCHARGE DIAGNOSES:  1. Chest pain, unstable angina ruled out.  2. Diabetes type 2, uncontrolled.  3. Morbid obesity.  4. Hypertension, uncontrolled.  5. Diabetic neuropathy.  6. Hyperlipidemia.  7. Anemia of chronic disease.   DISCHARGE MEDICATIONS:  1. Lantus 10 units subcutaneous at bedtime.  2. NovoLog sliding scale insulin before meals and at bedtime.  3. Imdur 15 mg p.o. daily.  4. Zocor 40 mg p.o. daily.  5. Lopressor 12.5 mg p.o. b.i.d.  6. Amlodipine 10 mg p.o. daily.  7. Glipizide 10 mg p.o. b.i.d.  8. Lisinopril 20 mg p.o. b.i.d.  9. Metformin 1000 mg p.o. b.i.d.   COURSE DURING THE HOSPITAL STAY:  Catherine Williams is a 50 year old African  American lady patient who was admitted on May 11, 2009 with complaints  of chest pain.  The patient stated that the chest pain was sudden in  onset radiating to the left upper extremity.  Denied of any diaphoresis  at that time and the patient was admitted to the telemetry bed.  The  patient had been noncompliant to her medications and has ran out of her  medications for more than a month.  The patient had mild elevations of  troponin and hence the patient was evaluated by Cardiology for the same.  The patient did undergo cardiac cath in view of high risk factors with  morbid obesity, uncontrolled diabetes, and hypertension with chest pain.  A cardiac cath revealed mild minimal irregularities in the luminal wall  which as per the recommendations of the  Cardiology could be managed  medically and recommended aggressive diabetes and blood pressure  control.  The patient at present denies of any chest pain and has been  counseled aggressively for weight loss and compliance to her medications  and also has a [facility] appointment for June 15, 2009 at 9:30 a.m.  The patient is also recommended to get medications from the [facility] on the $4 drug list.  The patient has verbalized understanding  the same.  The patient at present is medically stable to be discharged  and can be discharged home.   PROCEDURES DONE DURING THE STAY IN THE HOSPITAL:  1. Cardiac cath done on May 13, 2009.  Physician who did the cardiac      cath was Dr. Acie Fredrickson.  Conclusions:  Minor coronary artery      irregularities, normal left ventricular systolic function, ejection      fraction of 60-65%, and recommended to have aggressive diabetes and      blood pressure control and aggressive weight  loss program.  2. A 2-D echo done during the stay in the hospital on May 12, 2009.      Conclusions:  Left ventricular cavity moderately dilated, systolic      function mildly reduced, ejection fraction was 45-50%, and regional      wall abnormalities could not be excluded.  Doppler parameters      consistent with abnormal left ventricular relaxation for grade 1      diastolic function.  Aortic valve revealed trivial regurgitation,      mitral valve no regurgitation, left atrium mildly dilated,      pulmonary arteries unable to be estimated due to inadequate TR      Doppler measurements.   LABORATORY WORKUP DONE DURING THE STAY IN THE HOSPITAL:  Total WBC is  8.1, hemoglobin 10.8, hematocrit 33.2, and platelets of 209.  Retic  count 1.8 and reticulocytes 79.  Sodium 140, potassium 4.7, chloride  105, bicarb 30, glucose 284, BUN 15, creatinine 1.27, calcium 9.4, and  magnesium 2.1.  Hemoglobin A1c 12.0.  Cardiac enzymes:  Troponin 0.09  and 0.13.  BNP is less than 30.   Total cholesterol 209, triglycerides  187, HDL 37, LDL 145, and VLDL 37.  Serum iron 50, TIBC 279, and  percentage saturation 18.  Vitamin B12 439, folate 9.6, and ferritin  135.  UA has been negative.   DISPOSITION:  The patient at present is medically stable to be  discharged and can be discharged home.  The patient has been  aggressively counseled for weight loss and medical compliance to her  medications.  The patient also has a followup appointment with  [facility] on June 15, 2009 at 9:30 and has been informed regarding the  same.  We will help the patient regarding the 3-day supply for her  medications and also Home Health RN has been set up for disease  management as an outpatient.   TOTAL TIME FOR DISCHARGE:  40 minutes including counseling of 15  minutes.      Jimmey Ralph, MD  Electronically Signed     MP/MEDQ  D:  05/14/2009  T:  05/14/2009  Job:  [id number]

## 2011-05-08 ENCOUNTER — Inpatient Hospital Stay (INDEPENDENT_AMBULATORY_CARE_PROVIDER_SITE_OTHER)
Admission: RE | Admit: 2011-05-08 | Discharge: 2011-05-08 | Disposition: A | Discharge status: Home / Self Care | Payer: Self-pay | Source: Ambulatory Visit | Attending: Emergency Medicine | Admitting: Emergency Medicine

## 2011-05-08 ENCOUNTER — Ambulatory Visit (HOSPITAL_BASED_OUTPATIENT_CLINIC_OR_DEPARTMENT_OTHER): Payer: Self-pay | Attending: Pulmonary Disease

## 2011-05-08 DIAGNOSIS — G4733 Obstructive sleep apnea (adult) (pediatric): Secondary | ICD-10-CM | POA: Insufficient documentation

## 2011-05-08 DIAGNOSIS — E119 Type 2 diabetes mellitus without complications: Secondary | ICD-10-CM

## 2011-05-08 DIAGNOSIS — L03317 Cellulitis of buttock: Secondary | ICD-10-CM

## 2011-05-08 DIAGNOSIS — L0231 Cutaneous abscess of buttock: Secondary | ICD-10-CM

## 2011-05-10 ENCOUNTER — Inpatient Hospital Stay (HOSPITAL_COMMUNITY)
Admission: RE | Admit: 2011-05-10 | Discharge: 2011-05-10 | Disposition: A | Discharge status: Home / Self Care | Payer: Self-pay | Source: Ambulatory Visit | Attending: Family Medicine | Admitting: Family Medicine

## 2011-05-11 LAB — CULTURE, ROUTINE-ABSCESS

## 2011-05-25 DIAGNOSIS — G4733 Obstructive sleep apnea (adult) (pediatric): Secondary | ICD-10-CM

## 2011-05-29 NOTE — Procedures (Addendum)
[name], [name].:  [id number]  MEDICAL RECORD Catherine.:  [id number]          PATIENT TYPE:  OUT  LOCATION:  SLEEP CENTER                 FACILITY[facility]  [facility]  PHYSICIAN:  Kathee Delton, MD,FCCPDATE OF BIRTH:  14-Jun-1961  DATE OF STUDY:  05/08/2011                           NOCTURNAL POLYSOMNOGRAM  REFERRING PHYSICIAN:  Kathee Delton, MD,FCCP  INDICATION FOR STUDY:  Hypersomnia with sleep apnea.  EPWORTH SLEEPINESS SCORE:  18.  MEDICATIONS:  SLEEP ARCHITECTURE:  The patient had a total sleep time of 371 minutes with Catherine slow wave sleep and only 55 minutes of REM.  Sleep onset latency was normal at 8 minutes and REM onset was normal at 66 minutes.  Sleep efficiency was mildly reduced at 89%.  RESPIRATORY DATA:  The patient was Williams to have 130 apneas and 417 obstructive hypopneas, giving her an apnea-hypopnea index of 89 events per hour.  The events occurred in all body positions and there was very loud snoring noted throughout.  OXYGEN DATA:  There was O2 desaturation as low as 47% with the patient's obstructive events.  CARDIAC DATA:  Catherine clinically significant arrhythmias were noted.  MOVEMENT-PARASOMNIA:  The patient had Catherine significant leg jerks or other abnormal behaviors seen.  IMPRESSIONS-RECOMMENDATIONS:  Severe obstructive sleep apnea/hypopnea syndrome with an AHI of 89 events per hour, and O2 desaturation as low as 47%.  Treatment for this degree of sleep apnea should focus primarily on CPAP as well as weight loss.     Kathee Delton, MD,FCCP Diplomate, [place] Board of Sleep Medicine Electronically Signed    [facility]/MEDQ  D:  05/25/2011 20:28:16  T:  05/26/2011 [id number]  Job:  [id number]

## 2011-06-24 ENCOUNTER — Encounter: Payer: Self-pay | Admitting: Pulmonary Disease

## 2011-06-24 ENCOUNTER — Ambulatory Visit (INDEPENDENT_AMBULATORY_CARE_PROVIDER_SITE_OTHER): Payer: Self-pay | Admitting: Pulmonary Disease

## 2011-06-24 VITALS — BP 130/90 | HR 81 | Temp 98.4°F | Ht 60.0 in | Wt 324.6 lb

## 2011-06-24 DIAGNOSIS — G4733 Obstructive sleep apnea (adult) (pediatric): Secondary | ICD-10-CM

## 2011-06-24 NOTE — Progress Notes (Signed)
  Subjective:    Patient ID: Catherine Williams, female    DOB: 1961-09-22, 50 y.o.   MRN: [id number]  HPI The pt comes in today for f/u after her recent sleep study.  She was found to have an AHI 89/hr, with desat to 47%.  I have reviewed the study with her in detail, and answered all of her questions.    Review of Systems  Constitutional: Negative for fever and unexpected weight change.  HENT: Positive for ear pain, congestion, sore throat, rhinorrhea, sneezing, postnasal drip and sinus pressure. Negative for nosebleeds, trouble swallowing and dental problem.   Eyes: Positive for redness and itching.  Respiratory: Positive for cough, chest tightness, shortness of breath and wheezing.   Cardiovascular: Positive for palpitations. Negative for leg swelling.  Gastrointestinal: Negative for nausea and vomiting.  Genitourinary: Negative for dysuria.  Musculoskeletal: Negative for joint swelling.  Skin: Negative for rash.  Neurological: Positive for headaches.  Hematological: Does not bruise/bleed easily.  Psychiatric/Behavioral: Negative for dysphoric mood. The patient is not nervous/anxious.        Objective:   Physical Exam Morbidly obese female in nad Nares without discharge or purulence LE with mild edema, no cyanosis Awake, but appears sleepy, moves all 4        Assessment & Plan:

## 2011-06-24 NOTE — Assessment & Plan Note (Signed)
The pt has very severe osa with significant oxygen desaturation.  I have reviewed the study with her in detail, and recommend treatment at this point with cpap while working on weight loss.  The pt agrees to try.  I will set the patient up on cpap at a moderate pressure level to allow for desensitization, and will troubleshoot the device over the next 4-6weeks if needed.  The pt is to call me if having issues with tolerance.  Will then optimize the pressure once patient is able to wear cpap on a consistent basis.

## 2011-06-24 NOTE — Patient Instructions (Signed)
Will start on cpap at moderate level.  Please call if having tolerance issues Work on weight loss followup with me in 5 weeks.

## 2011-06-27 ENCOUNTER — Other Ambulatory Visit (HOSPITAL_COMMUNITY): Payer: Self-pay | Admitting: Family Medicine

## 2011-06-27 DIAGNOSIS — Z1231 Encounter for screening mammogram for malignant neoplasm of breast: Secondary | ICD-10-CM

## 2011-07-01 ENCOUNTER — Ambulatory Visit (HOSPITAL_COMMUNITY): Payer: Self-pay

## 2011-07-08 ENCOUNTER — Ambulatory Visit (HOSPITAL_COMMUNITY)
Admission: RE | Admit: 2011-07-08 | Discharge: 2011-07-08 | Disposition: A | Discharge status: Home / Self Care | Payer: Self-pay | Source: Ambulatory Visit | Attending: Family Medicine | Admitting: Family Medicine

## 2011-07-08 DIAGNOSIS — Z1231 Encounter for screening mammogram for malignant neoplasm of breast: Secondary | ICD-10-CM | POA: Insufficient documentation

## 2011-07-29 ENCOUNTER — Ambulatory Visit (INDEPENDENT_AMBULATORY_CARE_PROVIDER_SITE_OTHER): Payer: Self-pay | Admitting: Pulmonary Disease

## 2011-07-29 ENCOUNTER — Encounter: Payer: Self-pay | Admitting: Pulmonary Disease

## 2011-07-29 VITALS — BP 140/80 | Temp 98.4°F | Ht 61.0 in | Wt 326.0 lb

## 2011-07-29 DIAGNOSIS — G4733 Obstructive sleep apnea (adult) (pediatric): Secondary | ICD-10-CM

## 2011-07-29 NOTE — Assessment & Plan Note (Signed)
The pt is doing fairly well with cpap, and has seen improvement in her symptoms since starting.  She has had a few episodes of claustrophobia, but I suspect they are more due to inadequate pressure with breakthru apnea rather than intolerance.  I have explained to her we need to optimize her pressure at this point.  Will use auto mode to achieve this.  I have encouraged her to work aggressively on weight loss.  Care Plan:  At this point, will arrange for the patient's machine to be changed over to auto mode for 2 weeks to optimize their pressure.  I will review the downloaded data once sent by dme, and also evaluate for compliance, leaks, and residual osa.  I will call the patient and dme to discuss the results, and have the patient's machine set appropriately.  This will serve as the pt's cpap pressure titration.

## 2011-07-29 NOTE — Progress Notes (Signed)
  Subjective:    Patient ID: Catherine Williams, female    DOB: September 08, 1961, 50 y.o.   MRN: [id number]  HPI The pt comes in today for f/u of her known severe osa.  She has been started on cpap last visit, and overall has done fairly well with the device.  She denies any issues with mask fit, but has had some issues with pressure on occasion.  She thinks it has helped her sleep, has no more am h/a, and feels her alertness during the day is better.  She understands that we have yet to optimize her cpap pressure.    Review of Systems  Constitutional: Negative for fever and unexpected weight change.  HENT: Positive for ear pain. Negative for nosebleeds, congestion, rhinorrhea, sneezing, trouble swallowing, dental problem, postnasal drip and sinus pressure.   Eyes: Negative for redness and itching.  Respiratory: Positive for cough and shortness of breath. Negative for chest tightness and wheezing.   Cardiovascular: Negative for palpitations and leg swelling.  Gastrointestinal: Negative for nausea and vomiting.  Genitourinary: Negative for dysuria.  Musculoskeletal: Negative for joint swelling.  Skin: Negative for rash.  Neurological: Negative for headaches.  Hematological: Does not bruise/bleed easily.  Psychiatric/Behavioral: Negative for dysphoric mood. The patient is not nervous/anxious.        Objective:   Physical Exam Morbidly obese female in nad No skin breakdown or pressure necrosis from cpap mask.  Nares without discharge or purulence Cor with rrr LE with 2+ edema, no cyanosis noted. Alert and oriented, moves all 4       Assessment & Plan:

## 2011-07-29 NOTE — Patient Instructions (Signed)
Will set your cpap machine on auto mode for the next 2 weeks to optimize the pressure for you.  Will call when report available. Work on weight loss followup with me in 1mos, but call if having issues with cpap

## 2011-07-31 ENCOUNTER — Encounter: Payer: Self-pay | Admitting: Pulmonary Disease

## 2011-09-09 ENCOUNTER — Ambulatory Visit (INDEPENDENT_AMBULATORY_CARE_PROVIDER_SITE_OTHER): Payer: Self-pay

## 2011-09-09 ENCOUNTER — Inpatient Hospital Stay (INDEPENDENT_AMBULATORY_CARE_PROVIDER_SITE_OTHER)
Admission: RE | Admit: 2011-09-09 | Discharge: 2011-09-09 | Disposition: A | Discharge status: Home / Self Care | Payer: Self-pay | Source: Ambulatory Visit | Attending: Emergency Medicine | Admitting: Emergency Medicine

## 2011-09-09 DIAGNOSIS — R7989 Other specified abnormal findings of blood chemistry: Secondary | ICD-10-CM

## 2011-09-09 DIAGNOSIS — S93409A Sprain of unspecified ligament of unspecified ankle, initial encounter: Secondary | ICD-10-CM

## 2011-09-09 LAB — POCT I-STAT, CHEM 8
BUN: 25 mg/dL — ABNORMAL HIGH (ref 6–23)
Calcium, Ion: 1.26 mmol/L (ref 1.12–1.32)
Glucose, Bld: 292 mg/dL — ABNORMAL HIGH (ref 70–99)
TCO2: 28 mmol/L (ref 0–100)

## 2011-09-13 ENCOUNTER — Inpatient Hospital Stay (HOSPITAL_COMMUNITY)
Admission: EM | Admit: 2011-09-13 | Discharge: 2011-10-06 | DRG: 616 | Disposition: A | Discharge status: Rehabilitation Facility | Payer: Medicaid Other | Attending: Family Medicine | Admitting: Family Medicine

## 2011-09-13 ENCOUNTER — Inpatient Hospital Stay (INDEPENDENT_AMBULATORY_CARE_PROVIDER_SITE_OTHER)
Admission: RE | Admit: 2011-09-13 | Discharge: 2011-09-13 | Disposition: A | Discharge status: Home / Self Care | Payer: Self-pay | Source: Ambulatory Visit | Attending: Family Medicine | Admitting: Family Medicine

## 2011-09-13 ENCOUNTER — Emergency Department (HOSPITAL_COMMUNITY): Payer: Medicaid Other

## 2011-09-13 DIAGNOSIS — E876 Hypokalemia: Secondary | ICD-10-CM | POA: Diagnosis not present

## 2011-09-13 DIAGNOSIS — Z87891 Personal history of nicotine dependence: Secondary | ICD-10-CM

## 2011-09-13 DIAGNOSIS — E662 Morbid (severe) obesity with alveolar hypoventilation: Secondary | ICD-10-CM | POA: Diagnosis present

## 2011-09-13 DIAGNOSIS — G92 Toxic encephalopathy: Secondary | ICD-10-CM | POA: Diagnosis not present

## 2011-09-13 DIAGNOSIS — J449 Chronic obstructive pulmonary disease, unspecified: Secondary | ICD-10-CM | POA: Diagnosis present

## 2011-09-13 DIAGNOSIS — N920 Excessive and frequent menstruation with regular cycle: Secondary | ICD-10-CM | POA: Diagnosis present

## 2011-09-13 DIAGNOSIS — Z9981 Dependence on supplemental oxygen: Secondary | ICD-10-CM

## 2011-09-13 DIAGNOSIS — N179 Acute kidney failure, unspecified: Secondary | ICD-10-CM | POA: Diagnosis present

## 2011-09-13 DIAGNOSIS — Z79899 Other long term (current) drug therapy: Secondary | ICD-10-CM

## 2011-09-13 DIAGNOSIS — E1165 Type 2 diabetes mellitus with hyperglycemia: Principal | ICD-10-CM | POA: Diagnosis present

## 2011-09-13 DIAGNOSIS — E1169 Type 2 diabetes mellitus with other specified complication: Principal | ICD-10-CM | POA: Diagnosis present

## 2011-09-13 DIAGNOSIS — G4733 Obstructive sleep apnea (adult) (pediatric): Secondary | ICD-10-CM | POA: Diagnosis present

## 2011-09-13 DIAGNOSIS — E1159 Type 2 diabetes mellitus with other circulatory complications: Secondary | ICD-10-CM | POA: Diagnosis present

## 2011-09-13 DIAGNOSIS — L02619 Cutaneous abscess of unspecified foot: Secondary | ICD-10-CM

## 2011-09-13 DIAGNOSIS — K219 Gastro-esophageal reflux disease without esophagitis: Secondary | ICD-10-CM | POA: Diagnosis present

## 2011-09-13 DIAGNOSIS — D631 Anemia in chronic kidney disease: Secondary | ICD-10-CM | POA: Diagnosis present

## 2011-09-13 DIAGNOSIS — R197 Diarrhea, unspecified: Secondary | ICD-10-CM | POA: Diagnosis not present

## 2011-09-13 DIAGNOSIS — J9620 Acute and chronic respiratory failure, unspecified whether with hypoxia or hypercapnia: Secondary | ICD-10-CM | POA: Diagnosis not present

## 2011-09-13 DIAGNOSIS — R5381 Other malaise: Secondary | ICD-10-CM | POA: Diagnosis not present

## 2011-09-13 DIAGNOSIS — L03119 Cellulitis of unspecified part of limb: Secondary | ICD-10-CM | POA: Diagnosis present

## 2011-09-13 DIAGNOSIS — Z8249 Family history of ischemic heart disease and other diseases of the circulatory system: Secondary | ICD-10-CM

## 2011-09-13 DIAGNOSIS — Z6841 Body Mass Index (BMI) 40.0 and over, adult: Secondary | ICD-10-CM

## 2011-09-13 DIAGNOSIS — E119 Type 2 diabetes mellitus without complications: Secondary | ICD-10-CM

## 2011-09-13 DIAGNOSIS — N184 Chronic kidney disease, stage 4 (severe): Secondary | ICD-10-CM | POA: Diagnosis not present

## 2011-09-13 DIAGNOSIS — D62 Acute posthemorrhagic anemia: Secondary | ICD-10-CM | POA: Diagnosis not present

## 2011-09-13 DIAGNOSIS — M869 Osteomyelitis, unspecified: Secondary | ICD-10-CM | POA: Diagnosis present

## 2011-09-13 DIAGNOSIS — IMO0002 Reserved for concepts with insufficient information to code with codable children: Principal | ICD-10-CM | POA: Diagnosis present

## 2011-09-13 DIAGNOSIS — Z833 Family history of diabetes mellitus: Secondary | ICD-10-CM

## 2011-09-13 DIAGNOSIS — G929 Unspecified toxic encephalopathy: Secondary | ICD-10-CM | POA: Diagnosis not present

## 2011-09-13 DIAGNOSIS — N2581 Secondary hyperparathyroidism of renal origin: Secondary | ICD-10-CM | POA: Diagnosis present

## 2011-09-13 DIAGNOSIS — Z794 Long term (current) use of insulin: Secondary | ICD-10-CM

## 2011-09-13 DIAGNOSIS — M9080 Osteopathy in diseases classified elsewhere, unspecified site: Secondary | ICD-10-CM | POA: Diagnosis present

## 2011-09-13 DIAGNOSIS — I70269 Atherosclerosis of native arteries of extremities with gangrene, unspecified extremity: Secondary | ICD-10-CM | POA: Diagnosis present

## 2011-09-13 DIAGNOSIS — J4489 Other specified chronic obstructive pulmonary disease: Secondary | ICD-10-CM | POA: Diagnosis present

## 2011-09-13 DIAGNOSIS — I129 Hypertensive chronic kidney disease with stage 1 through stage 4 chronic kidney disease, or unspecified chronic kidney disease: Secondary | ICD-10-CM | POA: Diagnosis present

## 2011-09-13 DIAGNOSIS — Z7982 Long term (current) use of aspirin: Secondary | ICD-10-CM

## 2011-09-13 HISTORY — PX: TOE AMPUTATION: SHX809

## 2011-09-13 LAB — POCT I-STAT, CHEM 8
Calcium, Ion: 1.24 mmol/L (ref 1.12–1.32)
Creatinine, Ser: 2.2 mg/dL — ABNORMAL HIGH (ref 0.50–1.10)
Glucose, Bld: 322 mg/dL — ABNORMAL HIGH (ref 70–99)
Hemoglobin: 12.2 g/dL (ref 12.0–15.0)
Sodium: 137 mEq/L (ref 135–145)
TCO2: 28 mmol/L (ref 0–100)

## 2011-09-13 LAB — BASIC METABOLIC PANEL
BUN: 22 mg/dL (ref 6–23)
GFR calc non Af Amer: 25 mL/min — ABNORMAL LOW (ref >60)
Glucose, Bld: 272 mg/dL — ABNORMAL HIGH (ref 70–99)
Potassium: 4.1 mEq/L (ref 3.5–5.1)

## 2011-09-13 LAB — CBC
MCH: 25.1 pg — ABNORMAL LOW (ref 26.0–34.0)
MCHC: 31.3 g/dL (ref 30.0–36.0)
Platelets: 293 10*3/uL (ref 150–400)
Platelets: 271 10*3/uL (ref 150–400)
RBC: 3.65 MIL/uL — ABNORMAL LOW (ref 3.87–5.11)
RDW: 14.8 % (ref 11.5–15.5)
RDW: 14.6 % (ref 11.5–15.5)
WBC: 14.1 10*3/uL — ABNORMAL HIGH (ref 4.0–10.5)

## 2011-09-13 LAB — DIFFERENTIAL
Basophils Absolute: 0 10*3/uL (ref 0.0–0.1)
Basophils Relative: 0 % (ref 0–1)
Eosinophils Absolute: 0 10*3/uL (ref 0.0–0.7)
Eosinophils Relative: 0 % (ref 0–5)
Monocytes Absolute: 0.8 10*3/uL (ref 0.1–1.0)
Neutro Abs: 11.2 10*3/uL — ABNORMAL HIGH (ref 1.7–7.7)

## 2011-09-14 ENCOUNTER — Inpatient Hospital Stay (HOSPITAL_COMMUNITY): Payer: Medicaid Other

## 2011-09-14 ENCOUNTER — Other Ambulatory Visit (HOSPITAL_COMMUNITY): Payer: Self-pay

## 2011-09-14 LAB — GLUCOSE, CAPILLARY: Glucose-Capillary: 201 mg/dL — ABNORMAL HIGH (ref 70–99)

## 2011-09-14 NOTE — H&P (Signed)
Catherine Williams, BOULER NO.:  [id number]  MEDICAL RECORD NO.:  [id number]  LOCATION:  [facility]                         FACILITY:  [facility]  PHYSICIAN:  Quintella Baton, MD      DATE OF BIRTH:  [birth year]  DATE OF ADMISSION:  09/13/2011 DATE OF DISCHARGE:                             HISTORY & PHYSICAL   PRIMARY CARE PHYSICIAN:  Dr. Pia Mau.  CODE STATUS:  Full code.    The patient goes to team 2.  CHIEF COMPLAINTS:  Pain, swelling in left lower extremity.  HISTORY OF PRESENT ILLNESS:  This is a 50 year old female who is morbidly obese.  She states that approximately a little over weeks ago she started getting pain in her left lower extremity.  She did go and see her PCP, her leg appeared normal, she was sent home. since then her leg  pain  and swelling have worsenened in the left lower extremity.  She is having difficulty walking secondary to the pain.  She has had fevers, chills, some nausea.  No vomiting.  No abdominal pain.  She states she has had a cough.  She has also developed a cough, it is productive of mucus yellow.  She reports no shortness of breath.  No wheeze.  Because of pain in the left extremity, she finally came to the ER.  She reports no chest pains.  No other symptoms.  History obtained from the patient who appears reliable.  PAST MEDICAL HISTORY: 1. COPD. 2. Obstructive sleep apnea. 3. History of CHF. 4. Diabetes mellitus. 5. Morbid obesity. 6. Chronic respiratory failure. 7. Hypertension. 8. GERD.  PAST SURGICAL HISTORY: 1. Oophorectomy, left. 2. Carpal tunnel release, right.  MEDICATIONS: 1. Aspirin 81 mg daily. 2. Klor-Con  20 mEq daily. 3. Lasix 20 mg daily. 4. Lasix/hydrochlorothiazide 20/25 p.o. daily. 5. Isosorbide dinitrate 30 mg b.i.d. 6. ? 10 mg daily. 7. Lipitor 40 mg daily. 8. Metoprolol 50 mg b.i.d. 9. Norvasc 10 mg daily. 10.AcipHex  20 mg daily. 11.Muscle relaxer.  ALLERGIES:  No known drug  allergies.  SOCIAL HISTORY:  Remote history of tobacco use.  Negative tobacco, alcohol, or illicit drugs.  The patient is on home oxygen.  She uses a CPAP machine.  She lives with her daughter, who is 46 years old.  She does not use assistive devices for ambulation.  FAMILY HISTORY:  Significant for diabetes mellitus and hypertension.  REVIEW OF SYSTEMS:  All 10-point systems reviewed, are negative except as noted in HPI.  PHYSICAL EXAMINATION:  VITAL SIGNS:  Blood pressure 165/82, pulse 87, respirations 20, temperature 97.6, satting 96% on room air. GENERAL:  Alert and oriented, morbidly obese female, currently appears in no acute distress. HEENT:  Eyes, pink conjunctivae.  PERRLA.  ENT, moist oral mucosa. Trachea midline. NECK: Supple.  No thyromegaly. LUNGS:  Poor exam secondary to the patient's body habitus, but no wheeze.  No use of accessory muscles.  Clear. CARDIOVASCULAR:  Regular rate and rhythm without murmurs, rubs, or gallops.  No JVD appreciated. ABDOMEN:  Morbidly obese, soft, positive bowel sounds.  Unable to assess organomegaly due to the patient's body habitus.  Not acute surgical abdomen. NEURO:  Cranial nerves II through XII grossly intact.  Sensation is intact. MUSCULOSKELETAL:  Strength 5/5 in all extremities.  The patient has some swelling on the left foot and warmth on the left lower extremity.  Mild erythema appreciated. PSYCHIATRIC:  Alert, oriented female.  LABORATORY DATA:  None drawn.  ASSESSMENT AND PLAN: 1. Cellulitis of left lower extremity.  The patient will be admitted.     We will obtain blood cultures, urine cultures.  We will start the     patient on antibiotics, Rocephin and vanco.  The ER physician did     report that the patient was febrile in the ER to 102; however, I am not     seeing recorded  temperature.  We will start some p.r.n. pain     medications.  We will also ultrasound the left extremity to     evaluate for DVT. 2. Morbid  obesity, BMI greater than 40; chronic respiratory failure;     obstructive sleep apnea; chronic obstructive pulmonary disease.     The patient will need a bariatric bed. 3. Remote history of tobacco use.     Continue oxygen as well as CPAP at night.  Nebulizers will be     ordered p.r.n.  We will also obtain a chest x-ray as the patient     states that she is coughing to be sure that there is no pulmonary     pathology. 4. Diabetes mellitus. 5. Gastroesophageal reflux disease. 6. History of congestive heart failure, stable.  Resume home     medications.          ______________________________ Quintella Baton, MD     DC/MEDQ  D:  09/13/2011  T:  09/13/2011  Job:  [id number]  Electronically Signed by Quintella Baton MD on 09/14/2011 03:56:01 AM

## 2011-09-15 ENCOUNTER — Inpatient Hospital Stay (HOSPITAL_COMMUNITY): Payer: Medicaid Other

## 2011-09-15 DIAGNOSIS — M7989 Other specified soft tissue disorders: Secondary | ICD-10-CM

## 2011-09-15 DIAGNOSIS — M79609 Pain in unspecified limb: Secondary | ICD-10-CM

## 2011-09-15 LAB — URINALYSIS, ROUTINE W REFLEX MICROSCOPIC
Bilirubin Urine: NEGATIVE
Ketones, ur: NEGATIVE mg/dL
Nitrite: NEGATIVE
Urobilinogen, UA: 1 mg/dL (ref 0.0–1.0)

## 2011-09-15 LAB — CBC
MCH: 24.8 pg — ABNORMAL LOW (ref 26.0–34.0)
MCV: 80 fL (ref 78.0–100.0)
Platelets: 276 10*3/uL (ref 150–400)
RDW: 14.7 % (ref 11.5–15.5)
WBC: 14 10*3/uL — ABNORMAL HIGH (ref 4.0–10.5)

## 2011-09-15 LAB — VITAMIN B12: Vitamin B-12: 508 pg/mL (ref 211–911)

## 2011-09-15 LAB — BASIC METABOLIC PANEL
Calcium: 9.5 mg/dL (ref 8.4–10.5)
Creatinine, Ser: 2.54 mg/dL — ABNORMAL HIGH (ref 0.50–1.10)
GFR calc Af Amer: 24 mL/min — ABNORMAL LOW (ref >60)
GFR calc non Af Amer: 20 mL/min — ABNORMAL LOW (ref >60)

## 2011-09-15 LAB — GLUCOSE, CAPILLARY
Glucose-Capillary: 205 mg/dL — ABNORMAL HIGH (ref 70–99)
Glucose-Capillary: 209 mg/dL — ABNORMAL HIGH (ref 70–99)

## 2011-09-15 LAB — IRON AND TIBC
Iron: <10 ug/dL — ABNORMAL LOW (ref 42–135)
UIBC: 204 ug/dL (ref 125–400)

## 2011-09-15 LAB — URINE MICROSCOPIC-ADD ON

## 2011-09-16 ENCOUNTER — Inpatient Hospital Stay (HOSPITAL_COMMUNITY): Payer: Medicaid Other

## 2011-09-16 LAB — CBC
HCT: 24.4 % — ABNORMAL LOW (ref 36.0–46.0)
Hemoglobin: 7.6 g/dL — ABNORMAL LOW (ref 12.0–15.0)
MCHC: 31.1 g/dL (ref 30.0–36.0)
RBC: 3.06 MIL/uL — ABNORMAL LOW (ref 3.87–5.11)

## 2011-09-16 LAB — GLUCOSE, CAPILLARY
Glucose-Capillary: 184 mg/dL — ABNORMAL HIGH (ref 70–99)
Glucose-Capillary: 231 mg/dL — ABNORMAL HIGH (ref 70–99)

## 2011-09-16 LAB — BASIC METABOLIC PANEL
CO2: 26 mEq/L (ref 19–32)
Chloride: 101 mEq/L (ref 96–112)
Creatinine, Ser: 2.89 mg/dL — ABNORMAL HIGH (ref 0.50–1.10)
Potassium: 3.8 mEq/L (ref 3.5–5.1)

## 2011-09-16 LAB — URINE CULTURE: Culture  Setup Time: 201209271426

## 2011-09-16 LAB — ABO/RH: ABO/RH(D): O POS

## 2011-09-17 LAB — GLUCOSE, CAPILLARY
Glucose-Capillary: 77 mg/dL (ref 70–99)
Glucose-Capillary: 64 mg/dL — ABNORMAL LOW (ref 70–99)
Glucose-Capillary: 69 mg/dL — ABNORMAL LOW (ref 70–99)
Glucose-Capillary: 63 mg/dL — ABNORMAL LOW (ref 70–99)

## 2011-09-17 LAB — CBC
HCT: 23.3 % — ABNORMAL LOW (ref 36.0–46.0)
MCV: 80.3 fL (ref 78.0–100.0)
RBC: 2.9 MIL/uL — ABNORMAL LOW (ref 3.87–5.11)
WBC: 13.5 10*3/uL — ABNORMAL HIGH (ref 4.0–10.5)

## 2011-09-17 LAB — BASIC METABOLIC PANEL
BUN: 32 mg/dL — ABNORMAL HIGH (ref 6–23)
CO2: 24 mEq/L (ref 19–32)
Chloride: 100 mEq/L (ref 96–112)
Creatinine, Ser: 3.14 mg/dL — ABNORMAL HIGH (ref 0.50–1.10)
Glucose, Bld: 96 mg/dL (ref 70–99)

## 2011-09-18 ENCOUNTER — Other Ambulatory Visit: Payer: Self-pay | Admitting: Specialist

## 2011-09-18 ENCOUNTER — Inpatient Hospital Stay (HOSPITAL_COMMUNITY): Payer: Medicaid Other

## 2011-09-18 LAB — C-REACTIVE PROTEIN: CRP: 33.57 mg/dL — ABNORMAL HIGH (ref <0.60)

## 2011-09-18 LAB — BASIC METABOLIC PANEL
BUN: 31 mg/dL — ABNORMAL HIGH (ref 6–23)
Chloride: 100 mEq/L (ref 96–112)
GFR calc Af Amer: 18 mL/min — ABNORMAL LOW (ref >60)
Potassium: 3.9 mEq/L (ref 3.5–5.1)

## 2011-09-18 LAB — GLUCOSE, CAPILLARY: Glucose-Capillary: 103 mg/dL — ABNORMAL HIGH (ref 70–99)

## 2011-09-18 LAB — GRAM STAIN

## 2011-09-18 LAB — CBC
HCT: 23.6 % — ABNORMAL LOW (ref 36.0–46.0)
Hemoglobin: 7.2 g/dL — ABNORMAL LOW (ref 12.0–15.0)
RDW: 14.8 % (ref 11.5–15.5)
WBC: 13.6 10*3/uL — ABNORMAL HIGH (ref 4.0–10.5)

## 2011-09-19 ENCOUNTER — Inpatient Hospital Stay (HOSPITAL_COMMUNITY): Payer: Medicaid Other

## 2011-09-19 DIAGNOSIS — Z48812 Encounter for surgical aftercare following surgery on the circulatory system: Secondary | ICD-10-CM

## 2011-09-19 LAB — HEMOGLOBIN AND HEMATOCRIT, BLOOD: Hemoglobin: 8.1 g/dL — ABNORMAL LOW (ref 12.0–15.0)

## 2011-09-19 LAB — BLOOD GAS, ARTERIAL
Acid-base deficit: 3.4 mmol/L — ABNORMAL HIGH (ref 0.0–2.0)
Drawn by: 23404
O2 Content: 3 L/min
pCO2 arterial: 51.6 mmHg — ABNORMAL HIGH (ref 35.0–45.0)
pH, Arterial: 7.268 — ABNORMAL LOW (ref 7.350–7.400)
pO2, Arterial: 62.1 mmHg — ABNORMAL LOW (ref 80.0–100.0)

## 2011-09-19 LAB — CBC
Hemoglobin: 8.3 g/dL — ABNORMAL LOW (ref 12.0–15.0)
MCH: 25.7 pg — ABNORMAL LOW (ref 26.0–34.0)
RBC: 3.23 MIL/uL — ABNORMAL LOW (ref 3.87–5.11)
WBC: 14 10*3/uL — ABNORMAL HIGH (ref 4.0–10.5)

## 2011-09-19 LAB — GLUCOSE, CAPILLARY
Glucose-Capillary: 84 mg/dL (ref 70–99)
Glucose-Capillary: 107 mg/dL — ABNORMAL HIGH (ref 70–99)

## 2011-09-20 ENCOUNTER — Inpatient Hospital Stay (HOSPITAL_COMMUNITY): Payer: Medicaid Other

## 2011-09-20 ENCOUNTER — Other Ambulatory Visit (HOSPITAL_COMMUNITY): Payer: Self-pay

## 2011-09-20 DIAGNOSIS — E872 Acidosis, unspecified: Secondary | ICD-10-CM

## 2011-09-20 DIAGNOSIS — E119 Type 2 diabetes mellitus without complications: Secondary | ICD-10-CM

## 2011-09-20 DIAGNOSIS — N179 Acute kidney failure, unspecified: Secondary | ICD-10-CM

## 2011-09-20 DIAGNOSIS — I739 Peripheral vascular disease, unspecified: Secondary | ICD-10-CM

## 2011-09-20 DIAGNOSIS — J9620 Acute and chronic respiratory failure, unspecified whether with hypoxia or hypercapnia: Secondary | ICD-10-CM

## 2011-09-20 LAB — BASIC METABOLIC PANEL
BUN: 36 mg/dL — ABNORMAL HIGH (ref 6–23)
BUN: 38 mg/dL — ABNORMAL HIGH (ref 6–23)
CO2: 21 mEq/L (ref 19–32)
Calcium: 9 mg/dL (ref 8.4–10.5)
Chloride: 103 mEq/L (ref 96–112)
Creatinine, Ser: 5.08 mg/dL — ABNORMAL HIGH (ref 0.50–1.10)
Creatinine, Ser: 5.45 mg/dL — ABNORMAL HIGH (ref 0.50–1.10)
GFR calc Af Amer: 10 mL/min — ABNORMAL LOW (ref >90)
GFR calc Af Amer: 9 mL/min — ABNORMAL LOW (ref >90)
GFR calc non Af Amer: 9 mL/min — ABNORMAL LOW (ref >90)
Glucose, Bld: 75 mg/dL (ref 70–99)
Potassium: 4.3 mEq/L (ref 3.5–5.1)

## 2011-09-20 LAB — GLUCOSE, CAPILLARY
Glucose-Capillary: 45 mg/dL — ABNORMAL LOW (ref 70–99)
Glucose-Capillary: 53 mg/dL — ABNORMAL LOW (ref 70–99)
Glucose-Capillary: 58 mg/dL — ABNORMAL LOW (ref 70–99)
Glucose-Capillary: 66 mg/dL — ABNORMAL LOW (ref 70–99)
Glucose-Capillary: 73 mg/dL (ref 70–99)
Glucose-Capillary: 75 mg/dL (ref 70–99)
Glucose-Capillary: 87 mg/dL (ref 70–99)

## 2011-09-20 LAB — BLOOD GAS, ARTERIAL
Acid-base deficit: 4 mmol/L — ABNORMAL HIGH (ref 0.0–2.0)
FIO2: 0.5 %
Mode: POSITIVE
O2 Saturation: 99.4 %
Patient temperature: 98.6
TCO2: 23 mmol/L (ref 0–100)
TCO2: 23 mmol/L (ref 0–100)
pCO2 arterial: 48 mmHg — ABNORMAL HIGH (ref 35.0–45.0)
pH, Arterial: 7.279 — ABNORMAL LOW (ref 7.350–7.400)
pH, Arterial: 7.276 — ABNORMAL LOW (ref 7.350–7.400)
pO2, Arterial: 78.9 mmHg — ABNORMAL LOW (ref 80.0–100.0)

## 2011-09-20 LAB — POCT I-STAT 3, ART BLOOD GAS (G3+)
Bicarbonate: 22.1 mEq/L (ref 20.0–24.0)
pCO2 arterial: 50.6 mmHg — ABNORMAL HIGH (ref 35.0–45.0)
pH, Arterial: 7.259 — ABNORMAL LOW (ref 7.350–7.400)
pO2, Arterial: 110 mmHg — ABNORMAL HIGH (ref 80.0–100.0)

## 2011-09-20 LAB — CULTURE, BLOOD (ROUTINE X 2)
Culture  Setup Time: 201209260419
Culture: NO GROWTH

## 2011-09-20 LAB — URINALYSIS, ROUTINE W REFLEX MICROSCOPIC
Ketones, ur: NEGATIVE mg/dL
Leukocytes, UA: NEGATIVE
Nitrite: NEGATIVE
Specific Gravity, Urine: 1.017 (ref 1.005–1.030)
pH: 5 (ref 5.0–8.0)

## 2011-09-20 LAB — CBC
HCT: 25.7 % — ABNORMAL LOW (ref 36.0–46.0)
MCV: 83.2 fL (ref 78.0–100.0)
Platelets: 326 10*3/uL (ref 150–400)
RBC: 3.09 MIL/uL — ABNORMAL LOW (ref 3.87–5.11)
WBC: 16 10*3/uL — ABNORMAL HIGH (ref 4.0–10.5)

## 2011-09-20 LAB — CROSSMATCH
ABO/RH(D): O POS
Unit division: 0
Unit division: 0

## 2011-09-20 LAB — FIBRINOGEN: Fibrinogen: >800 mg/dL — ABNORMAL HIGH (ref 204–475)

## 2011-09-20 LAB — COMPREHENSIVE METABOLIC PANEL
Albumin: 2 g/dL — ABNORMAL LOW (ref 3.5–5.2)
BUN: 40 mg/dL — ABNORMAL HIGH (ref 6–23)
Creatinine, Ser: 6.39 mg/dL — ABNORMAL HIGH (ref 0.50–1.10)
Total Protein: 7.2 g/dL (ref 6.0–8.3)

## 2011-09-20 LAB — URINE MICROSCOPIC-ADD ON

## 2011-09-20 LAB — PROTEIN, URINE, RANDOM: Total Protein, Urine: 95.5 mg/dL

## 2011-09-20 LAB — VANCOMYCIN, RANDOM: Vancomycin Rm: 39.3 ug/mL

## 2011-09-20 NOTE — Consult Note (Signed)
  NAMESUNSHYNE, HANDWERK NO.:  [id number]  MEDICAL RECORD NO.:  [id number]  LOCATION:  [id number]                         FACILITY:  [facility]  PHYSICIAN:  Jessy Oto, M.D.   DATE OF BIRTH:  1961-07-27  DATE OF CONSULTATION:  09/18/2011 DATE OF DISCHARGE:                                CONSULTATION   REQUESTING PHYSICIAN:  Domingo Mend, MD  ORTHOPEDIC DIAGNOSES:  Left foot with gangrene changes involving the left little toe with osteomyelitis by MRI involving the distal middle and proximal phalanx left little toe, cellulitis in the left forefoot with bulla changes involving the left fourth toe.  Evidence of suggestion with soft tissue infection involving the left fourth toe as well as fifth.  CHIEF COMPLAINT:  Left foot pain.  HISTORY OF PRESENT ILLNESS:  A 50 year old female with diabetes mellitus, obesity, on home O2 for chronic sleep apnea, COPD.  She has had a 2-week history of increasing left foot swelling with drainage over the lateral aspect of foot, fever and chills, admitted on September 13, 2011, placed on IV antibiotics.  She has had persistent fever, chills, white cell count at 14,000.  Her clinical exam shows that she had swelling involving the left forefoot diffusely, no definable plantar abscess.  Swelling involving the left fourth and fifth toes with bulla changes nearly circumferential about the left fourth toe and over the superior and lateral aspect of the fifth toe with open bulla and drainage of purulent material grayish round consistent with the foul motor.  The patient has dorsalis pedis pulses trace, posterior tibialis pulse raise.  The leg is warm.  She notes sensation over the dorsal aspect of the foot to the forefoot level.  Her MRI scan showing osteomyelitis changes in left little toe, areas of cellulitis with questionable small abscess over the dorsal aspect of the little toe proximal phalanx.  DIAGNOSIS:  Left foot blood gangrene  changes involving the left little toe laterally with osteomyelitis changes by MRI scan, soft tissue infection involving at least the fourth phalanx as well in the fourth phalanges as well.  PLAN:  Discussed options with the patient and recommended that she go ahead and have an amputation of the left little toe and incision and drainage and debridement of the left forefoot including the fourth toe. It should be done in the operating room under anesthetic today. Discontinue Lovenox while preparing for this surgery.  Risks of surgery including infection, bleeding, neurologic compromise were discussed with this patient.     Jessy Oto, M.D.     JEN/MEDQ  D:  09/18/2011  T:  09/18/2011  Job:  [id number]  Electronically Signed by Basil Dess M.D. on 09/20/2011 06:32:16 PM

## 2011-09-20 NOTE — Op Note (Signed)
NAMEMISSEY, MCCRIGHT NO.:  [id number]  MEDICAL RECORD NO.:  [id number]  LOCATION:  [id number]                         FACILITY:  [facility]  PHYSICIAN:  Jessy Oto, M.D.   DATE OF BIRTH:  27-Feb-1961  DATE OF PROCEDURE:  09/18/2011 DATE OF DISCHARGE:                              OPERATIVE REPORT   PREOPERATIVE DIAGNOSIS:  Wet gangrene changes involving the left little toe with bulla changes soft tissue infection left fourth toe forefoot.  POSTOPERATIVE DIAGNOSIS:  Left fifth toe wet gangrene changes with osteomyelitis involving the proximal, middle, and distal phalanges of the little toe, left fourth toe with abscess surrounding the base of the proximal phalanx with desquamation of nearly the entire digit skin and nail consistent with early wet gangrene changes.  PROCEDURE:  Left fifth and fourth digit amputation at the metatarsal neck level, debridement off forefoot abscess.  SURGEON:  Jessy Oto, MD  ASSISTANT:  None.  ANESTHESIA:  General via orotracheal intubation, Dr. Al Corpus.  FINDINGS:  Wet gangrene changes left fourth and fifth toes.  SPECIMENS:  Cultures anaerobic and aerobic stat Gram stain obtained.  ESTIMATED BLOOD LOSS:  Less than 50 mL.  COMPLICATIONS:  None.  TOTAL TOURNIQUET TIME:  25 minutes.  This at 350 mmHg.  The patient returned to the PACU in good condition.  DRAINS:  Penrose drain left lateral aspect of the fifth toe incision site.  Fourth toe incision site packed open with normal saline wet-to- dry.  DESCRIPTION OF PROCEDURE:  After the patient was seen in the preoperative holding area and all questions answered marking of the left leg for continued identification with X as my initials and standard preoperative antibiotics.  She was on vancomycin and Zosyn preoperatively and received these at least an hour prior to surgery. Brought to the operating room via her stretcher, OR room #1 [facility] used for this procedure.   There, she was transferred to the OR table, underwent induction of general anesthesia and was intubated atraumatically.  She had standard prep with DuraPrep solution from the upper calf level to the left toes.  Draped in the usual manner. Tourniquet about the left upper calf.  The initial incision ellipsing the fifth toe at its base using a standard racket type incision about the base of the fifth toe was carried down to the proximal phalanx and into the MTP joint.  Racket incision then carried along the superior lateral aspect of the metatarsal to the fifth metatarsal.  Incision was then carried down to the base of the proximal phalanx and the patient had incision of the capsular lining of the fifth metatarsophalangeal joint with this resection.  Attention then turned to the areas of infection of soft tissue at the base of the proximal phalanx.  The infection noted to extend to the dorsal aspect of the metatarsophalangeal joint and in plantarward as well, so that the racket incision was carried along the superior lateral aspect of the fifth metatarsal over a distance about an inch and half.  Subperiosteal dissection performed and then the distal metacarpal head, neck, and fifth metatarsal were carefully resected using Beyer rongeur to thin this and it went a  bit along its plantar surface.  Next, with examination of the incision, the plantar flap showed areas of severe necrosis of fat and continued purulence.  This continued at the base of the fourth proximal phalanx where the infection was observed to be present within all subcu layers at the base of the fourth proximal phalanx.  So I felt that an amputation of the fourth toe was necessary.  The skin that was bolused over the toe itself was carefully debrided and with debridement slipped off the end of the fourth toe removing the nail with it leaving raw dermal surface with extreme petechia.  It was felt that this represented early  gangrene changes of the fourth phalanx, proximal, distal and middle.  With this then the incision was carried circumferentially about the base of the fourth proximal phalanx and the phalanx delivered into the wound and divided at its base, preserving the incision site.  The proximal phalanx was divided at the metatarsophalangeal joint and the metatarsal head and neck were then carefully divided back and beveled along its plantar surface laterally.  Similarly, this was done to the fifth metatarsal neck and head area.  Irrigation was then carried out using copious amounts of double antibiotic saline solution.  The flaps were then carefully evaluated and found to be approximated nicely over the end of the fifth metatarsal resection area, so that the skin was reapproximated after further irrigation with interrupted Prolene sutures of 3-0.  A single Penrose drain quarter inch was placed through the lateral suture area into the disk space at the level of the distal fifth metatarsal.  Skin between the toes, the fourth and fifth was able to be approximated with further 3-0 Prolene suture.  The wound overlying the fourth distal metatarsal was left open, packed with normal saline wet to dry 4x4s.  Excess 4x4s was removed and Adaptic, 4x4s, ABD pads fixed to the foot with Kerlix and Ace wrap applied.  Tourniquet was released.  Total tourniquet time was 25 minutes.  The patient was then reactivated, extubated, and returned to the recovery room in satisfactory condition.  All instrument and sponge counts were correct. Note, that a standard time-out protocol was carried out at the beginning of this patient's case identifying the patient and the procedure to be done in standard fashion.     Jessy Oto, M.D.     JEN/MEDQ  D:  09/18/2011  T:  09/18/2011  Job:  [id number]  Electronically Signed by Basil Dess M.D. on 09/20/2011 06:34:38 PM

## 2011-09-21 ENCOUNTER — Other Ambulatory Visit: Payer: Self-pay | Admitting: Internal Medicine

## 2011-09-21 ENCOUNTER — Inpatient Hospital Stay (HOSPITAL_COMMUNITY): Payer: Medicaid Other

## 2011-09-21 DIAGNOSIS — M7989 Other specified soft tissue disorders: Secondary | ICD-10-CM

## 2011-09-21 DIAGNOSIS — M79609 Pain in unspecified limb: Secondary | ICD-10-CM

## 2011-09-21 LAB — POCT I-STAT 3, ART BLOOD GAS (G3+)
Acid-base deficit: 4 mmol/L — ABNORMAL HIGH (ref 0.0–2.0)
Acid-base deficit: 1 mmol/L (ref 0.0–2.0)
Bicarbonate: 22.8 mEq/L (ref 20.0–24.0)
O2 Saturation: 100 %
Patient temperature: 37.8
TCO2: 24 mmol/L (ref 0–100)
pH, Arterial: 7.425 — ABNORMAL HIGH (ref 7.350–7.400)
pO2, Arterial: 152 mmHg — ABNORMAL HIGH (ref 80.0–100.0)

## 2011-09-21 LAB — BASIC METABOLIC PANEL
BUN: 43 mg/dL — ABNORMAL HIGH (ref 6–23)
Calcium: 8.7 mg/dL (ref 8.4–10.5)
GFR calc Af Amer: 7 mL/min — ABNORMAL LOW (ref >90)
GFR calc non Af Amer: 6 mL/min — ABNORMAL LOW (ref >90)
Glucose, Bld: 65 mg/dL — ABNORMAL LOW (ref 70–99)
Sodium: 139 mEq/L (ref 135–145)

## 2011-09-21 LAB — URINE CULTURE

## 2011-09-21 LAB — CBC
HCT: 19.9 % — ABNORMAL LOW (ref 36.0–46.0)
HCT: 23.7 % — ABNORMAL LOW (ref 36.0–46.0)
Hemoglobin: 7.4 g/dL — ABNORMAL LOW (ref 12.0–15.0)
Hemoglobin: 7.1 g/dL — ABNORMAL LOW (ref 12.0–15.0)
MCH: 26.1 pg (ref 26.0–34.0)
MCHC: 31.2 g/dL (ref 30.0–36.0)
MCHC: 31.2 g/dL (ref 30.0–36.0)
MCV: 82.4 fL (ref 78.0–100.0)
Platelets: 320 10*3/uL (ref 150–400)
Platelets: 289 10*3/uL (ref 150–400)
RBC: 2.73 MIL/uL — ABNORMAL LOW (ref 3.87–5.11)
RDW: 15.5 % (ref 11.5–15.5)
RDW: 15.3 % (ref 11.5–15.5)
WBC: 10.8 10*3/uL — ABNORMAL HIGH (ref 4.0–10.5)
WBC: 11.6 10*3/uL — ABNORMAL HIGH (ref 4.0–10.5)

## 2011-09-21 LAB — HEPATITIS B SURFACE ANTIBODY,QUALITATIVE: Hep B S Ab: NEGATIVE

## 2011-09-21 LAB — GLUCOSE, CAPILLARY
Glucose-Capillary: 50 mg/dL — ABNORMAL LOW (ref 70–99)
Glucose-Capillary: 65 mg/dL — ABNORMAL LOW (ref 70–99)
Glucose-Capillary: 71 mg/dL (ref 70–99)
Glucose-Capillary: 73 mg/dL (ref 70–99)
Glucose-Capillary: 79 mg/dL (ref 70–99)
Glucose-Capillary: 86 mg/dL (ref 70–99)

## 2011-09-21 LAB — COMPREHENSIVE METABOLIC PANEL
ALT: 16 U/L (ref 0–35)
AST: 31 U/L (ref 0–37)
Albumin: 1.7 g/dL — ABNORMAL LOW (ref 3.5–5.2)
Alkaline Phosphatase: 122 U/L — ABNORMAL HIGH (ref 39–117)
BUN: 41 mg/dL — ABNORMAL HIGH (ref 6–23)
Chloride: 101 mEq/L (ref 96–112)
Potassium: 4.3 mEq/L (ref 3.5–5.1)
Sodium: 136 mEq/L (ref 135–145)
Total Bilirubin: 0.3 mg/dL (ref 0.3–1.2)
Total Protein: 6.7 g/dL (ref 6.0–8.3)

## 2011-09-21 LAB — APTT: aPTT: 35 seconds (ref 24–37)

## 2011-09-21 LAB — DIFFERENTIAL
Basophils Absolute: 0 10*3/uL (ref 0.0–0.1)
Basophils Relative: 0 % (ref 0–1)
Eosinophils Relative: 1 % (ref 0–5)
Lymphocytes Relative: 9 % — ABNORMAL LOW (ref 12–46)

## 2011-09-21 LAB — HEPATITIS B SURFACE ANTIGEN: Hepatitis B Surface Ag: NEGATIVE

## 2011-09-21 LAB — C3 COMPLEMENT: C3 Complement: 186 mg/dL — ABNORMAL HIGH (ref 90–180)

## 2011-09-22 ENCOUNTER — Inpatient Hospital Stay (HOSPITAL_COMMUNITY): Payer: Medicaid Other

## 2011-09-22 DIAGNOSIS — N179 Acute kidney failure, unspecified: Secondary | ICD-10-CM

## 2011-09-22 DIAGNOSIS — M8610 Other acute osteomyelitis, unspecified site: Secondary | ICD-10-CM

## 2011-09-22 DIAGNOSIS — J9620 Acute and chronic respiratory failure, unspecified whether with hypoxia or hypercapnia: Secondary | ICD-10-CM

## 2011-09-22 DIAGNOSIS — I739 Peripheral vascular disease, unspecified: Secondary | ICD-10-CM

## 2011-09-22 DIAGNOSIS — E872 Acidosis: Secondary | ICD-10-CM

## 2011-09-22 LAB — GLUCOSE, CAPILLARY
Glucose-Capillary: 85 mg/dL (ref 70–99)
Glucose-Capillary: 127 mg/dL — ABNORMAL HIGH (ref 70–99)
Glucose-Capillary: 161 mg/dL — ABNORMAL HIGH (ref 70–99)

## 2011-09-22 LAB — RENAL FUNCTION PANEL
CO2: 24 mEq/L (ref 19–32)
Calcium: 8.2 mg/dL — ABNORMAL LOW (ref 8.4–10.5)
GFR calc Af Amer: 9 mL/min — ABNORMAL LOW (ref >90)
GFR calc non Af Amer: 8 mL/min — ABNORMAL LOW (ref >90)
Sodium: 134 mEq/L — ABNORMAL LOW (ref 135–145)

## 2011-09-22 LAB — POCT I-STAT 3, ART BLOOD GAS (G3+)
Acid-base deficit: 2 mmol/L (ref 0.0–2.0)
Bicarbonate: 23.2 mEq/L (ref 20.0–24.0)
O2 Saturation: 97 %
TCO2: 24 mmol/L (ref 0–100)
pCO2 arterial: 31.3 mmHg — ABNORMAL LOW (ref 35.0–45.0)
pH, Arterial: 7.469 — ABNORMAL HIGH (ref 7.350–7.400)
pO2, Arterial: 100 mmHg (ref 80.0–100.0)
pO2, Arterial: 95 mmHg (ref 80.0–100.0)

## 2011-09-22 LAB — PREPARE RBC (CROSSMATCH)

## 2011-09-22 LAB — CBC
HCT: 20.5 % — ABNORMAL LOW (ref 36.0–46.0)
MCH: 26 pg (ref 26.0–34.0)
MCV: 81.3 fL (ref 78.0–100.0)
Platelets: 275 10*3/uL (ref 150–400)
RBC: 2.52 MIL/uL — ABNORMAL LOW (ref 3.87–5.11)
RBC: 2.69 MIL/uL — ABNORMAL LOW (ref 3.87–5.11)
WBC: 9.6 10*3/uL (ref 4.0–10.5)
WBC: 9.8 10*3/uL (ref 4.0–10.5)

## 2011-09-23 ENCOUNTER — Inpatient Hospital Stay (HOSPITAL_COMMUNITY): Payer: Medicaid Other

## 2011-09-23 DIAGNOSIS — I739 Peripheral vascular disease, unspecified: Secondary | ICD-10-CM

## 2011-09-23 LAB — CROSSMATCH
ABO/RH(D): O POS
Antibody Screen: NEGATIVE
Unit division: 0
Unit division: 0

## 2011-09-23 LAB — GLUCOSE, CAPILLARY
Glucose-Capillary: 197 mg/dL — ABNORMAL HIGH (ref 70–99)
Glucose-Capillary: 202 mg/dL — ABNORMAL HIGH (ref 70–99)
Glucose-Capillary: 208 mg/dL — ABNORMAL HIGH (ref 70–99)
Glucose-Capillary: 218 mg/dL — ABNORMAL HIGH (ref 70–99)

## 2011-09-23 LAB — ANAEROBIC CULTURE

## 2011-09-23 LAB — RENAL FUNCTION PANEL
CO2: 25 mEq/L (ref 19–32)
Chloride: 97 mEq/L (ref 96–112)
GFR calc Af Amer: 13 mL/min — ABNORMAL LOW (ref >90)
GFR calc non Af Amer: 11 mL/min — ABNORMAL LOW (ref >90)
Potassium: 3.3 mEq/L — ABNORMAL LOW (ref 3.5–5.1)
Sodium: 134 mEq/L — ABNORMAL LOW (ref 135–145)

## 2011-09-23 LAB — BASIC METABOLIC PANEL
Calcium: 8.7 mg/dL (ref 8.4–10.5)
Creatinine, Ser: 4.43 mg/dL — ABNORMAL HIGH (ref 0.50–1.10)
GFR calc non Af Amer: 11 mL/min — ABNORMAL LOW (ref >90)
Glucose, Bld: 198 mg/dL — ABNORMAL HIGH (ref 70–99)
Sodium: 133 mEq/L — ABNORMAL LOW (ref 135–145)

## 2011-09-23 LAB — CBC
Platelets: 259 10*3/uL (ref 150–400)
RBC: 2.96 MIL/uL — ABNORMAL LOW (ref 3.87–5.11)
WBC: 11.5 10*3/uL — ABNORMAL HIGH (ref 4.0–10.5)

## 2011-09-23 LAB — PROTEIN ELECTROPHORESIS, SERUM
Gamma Globulin: 9.1 % — ABNORMAL LOW (ref 11.1–18.8)
M-Spike, %: NOT DETECTED g/dL
Total Protein ELP: 6 g/dL (ref 6.0–8.3)

## 2011-09-24 ENCOUNTER — Inpatient Hospital Stay (HOSPITAL_COMMUNITY): Payer: Medicaid Other

## 2011-09-24 LAB — RENAL FUNCTION PANEL
CO2: 25 mEq/L (ref 19–32)
Calcium: 9.4 mg/dL (ref 8.4–10.5)
Chloride: 96 mEq/L (ref 96–112)
GFR calc Af Amer: 12 mL/min — ABNORMAL LOW (ref >90)
GFR calc non Af Amer: 10 mL/min — ABNORMAL LOW (ref >90)
Glucose, Bld: 232 mg/dL — ABNORMAL HIGH (ref 70–99)
Potassium: 4 mEq/L (ref 3.5–5.1)
Sodium: 134 mEq/L — ABNORMAL LOW (ref 135–145)

## 2011-09-24 LAB — GLUCOSE, CAPILLARY
Glucose-Capillary: 188 mg/dL — ABNORMAL HIGH (ref 70–99)
Glucose-Capillary: 225 mg/dL — ABNORMAL HIGH (ref 70–99)
Glucose-Capillary: 245 mg/dL — ABNORMAL HIGH (ref 70–99)
Glucose-Capillary: 251 mg/dL — ABNORMAL HIGH (ref 70–99)
Glucose-Capillary: 269 mg/dL — ABNORMAL HIGH (ref 70–99)

## 2011-09-24 LAB — CBC
Hemoglobin: 8.4 g/dL — ABNORMAL LOW (ref 12.0–15.0)
MCH: 27 pg (ref 26.0–34.0)
RBC: 3.11 MIL/uL — ABNORMAL LOW (ref 3.87–5.11)
WBC: 13.9 10*3/uL — ABNORMAL HIGH (ref 4.0–10.5)

## 2011-09-25 LAB — RENAL FUNCTION PANEL
Albumin: 1.9 g/dL — ABNORMAL LOW (ref 3.5–5.2)
Calcium: 9.9 mg/dL (ref 8.4–10.5)
GFR calc Af Amer: 11 mL/min — ABNORMAL LOW (ref >90)
Phosphorus: 5.5 mg/dL — ABNORMAL HIGH (ref 2.3–4.6)
Potassium: 3.8 mEq/L (ref 3.5–5.1)
Sodium: 136 mEq/L (ref 135–145)

## 2011-09-25 LAB — CBC
MCH: 26.5 pg (ref 26.0–34.0)
MCHC: 31.8 g/dL (ref 30.0–36.0)
Platelets: 361 10*3/uL (ref 150–400)
RDW: 14.3 % (ref 11.5–15.5)

## 2011-09-25 LAB — PROCALCITONIN: Procalcitonin: 0.64 ng/mL

## 2011-09-25 LAB — GLUCOSE, CAPILLARY
Glucose-Capillary: 273 mg/dL — ABNORMAL HIGH (ref 70–99)
Glucose-Capillary: 215 mg/dL — ABNORMAL HIGH (ref 70–99)
Glucose-Capillary: 213 mg/dL — ABNORMAL HIGH (ref 70–99)

## 2011-09-25 NOTE — Progress Notes (Signed)
NAMERHONDA, PRAT NO.:  [id number]  MEDICAL RECORD NO.:  [id number]  LOCATION:  3302                         FACILITY:  [facility]  PHYSICIAN:  Annita Brod, M.D.DATE OF BIRTH:  08-19-1961                                PROGRESS NOTE   This summary will cover events from September 13, 2011, to current September 25, 2011.  PRIMARY CARE PHYSICIAN: The patient's PCP is Dr. Pia Mau.  CONSULTANTS ON THE CASE: Jessy Oto, MD, Orthopedic Surgery. Elzie Rings Lorrene Reid, MD, Nephrology. 1. Annamarie Major IV, MD, Vascular Surgery. Chesley Mires, MD, Critical Care Medicine.  HOSPITAL COURSE: The patient is 50 year old African female admitted to the hospital service on September 13, 2011, with complaints of left foot pain. Initially thought to have left foot cellulitis, she was treated with vancomycin and Rocephin, but then because of concerns Pseudomonas, her Rocephin was changed over to Zosyn.  The patient's treatment over the initial several days continued to decline.  She became febrile.  An MRI was done which noted osteomyelitis of the proximal middle phalanx of the left small toe with a probable soft tissue abscess.  Dr. Louanne Skye from Orthopedic Surgery was consulted.  He took her to the operating room on September 18, 2011, and performed an amputation of the fourth and fifth digits.  The patient still continued to remain afebrile, and Dr. Louanne Skye noted concerns of a possible systolic murmur.  Blood cultures were reordered.  The hospitalist who followed up from Dr. Otho Ket concerns did not appreciate any murmurs.  The patient did have positive blood cultures and echo versus TEE would be done to rule out endocarditis.  In the time it was noted that the patient has a history of chronic kidney disease with a baseline creatinine of somewhere  between 2 and 3, but had been slowly rising up to 5.4 despite the fact that she had never been hypotensive.   Urinalysis noted proteinuria.  Ultrasound noted no evidence any hydronephrosis. ACE inhibitor and diuretic were then discontinued.  Nephrology was consulted.  There was some difficulty clarifying whether or not the patient's urine output was recorded Dr. Lorrene Reid was concerned about the possibility of anuria.  A Foley catheter was placed.  Protonix was also considering being held for a possible negative effect on the kidneys. 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  Since her surgery, the patient's hemoglobin started declining; it went down to 6.4 as of September 19, 2011, and she was given 2 units of packed red blood cells.  She was also noted be somewhat sleepy and lethargic as of September 19, 2011, and an ABG was ordered.  She has a history of obstructive sleep apnea and was put on BiPAP.  Vascular Surgery was consulted as well to evaluate the patient for possible peripheral vascular disease.  Because of her worsening renal function, a CTA or arteriogram was put on hold.  Over the next day, the patient continued to show signs of decline.  It was likely felt that she would need dialysis soon.  A central line for IV access was initially placed.  The patient was  also noted to have some significant vaginal bleeding with large clots were passed.  Dr. Genia Harold for OB/GYN was consulted.  They recommended stopping the menorrhagia first, stabilizing the patient, and then following up with an endometrial biopsy and Pap smear in the office.  They felt that the patient was high risk for DVT and did not recommend any Premarin or estrogen.  They tried Provera 10 mg b.i.d. until the bleeding had decreased, and then 10 mg once a day for about a month.  A D and C would not help at this point considering poor medical health, and the endometrial stripe is not too thick.  Recommended keeping her on blood  transfusions until her  bleeding had resolved.  By September 20, 2011, the patient's lethargy had again persistent.  ABGs were checked noting again mixed respiratory metabolic.  At that point, she was found to be quite in respiratory distress and she was transferred to the ICU under critical care medicine care.  Her ABGs noted hypercarbia and they felt like she was hypercarbic with toxic metabolic encephalopathy and she was obtunded.  Narcan did little to reverse her issues.  She was not intubated.  Because of a poor p.o. intake, she was having episodes of hypoglycemia.  Her insulin was scaled back.  Over the next several days, she was managed by Critical Care Medicine.  It was felt that the obstructive sleep apnea and hypercarbia were the contributing factors to her respiratory issues which was leading to her obtundation.  The patient was intubated on the evening of September 21, 2011.  She continued to have fevers which were felt to be secondary to her continued osteomyelitis with nonhealing of the left foot fourth and fifth digits.  They felt this was probably secondary arterial disease and Vascular Surgery was evaluating for possible revascularization.  A dialysis catheter was placed on September 21, 2011, as well.  Vascular Surgery followed up postintubation.  They felt the patient was not a candidate for revascularizations currently with medical issues and acute renal failure.  They also felt that she was not septic from the site, and therefore they would not proceed with a more proximal amputation; however, this may be required in the future.  By September 23, 2011, the patient had been receiving  2 days now of hemodialysis.  Three liters were removed, electrolytes remained stable, creatinine was slowly trending down, and her anemia was stable.  She had stopped bleeding vaginally.  The patient's pulmonary function had improved to the point where she was to be extubated on September 23, 2011, evening.  Her  antibiotics had been changed again.  As of September 23, 2011, she was on 2 days of Primaxin and 1 day of linezolid tolerating well.  With intermittent hemodialysis, her renal function was improving. Her acute blood loss anemia from her hemorrhaging was stable. Osteomyelitis, she was now afebrile on imipenem and Zyvox.  She was still episodes of hypoglycemia, but the tenderness had resolved and she was able to wean off the D10.  She was more awake.  Vascular Surgery followed up on the evening of September 23, 2011, confirming again that she was not a candidate for revascularization.  By September 24, 2011, the patient was breathing much more comfortably on her own.  In regards to her renal failure, her creatinine is starting to trend up.  They are going to give her a trial of diuretics to see how she will do.  It was felt that her acute kidney  injuries are secondary to sepsis versus hemodynamic effects of her ACE inhibitor, and acute blood loss anemia, as well as increased vancomycin level.  Her renal function has much improved.  She is nonoliguric on IV Lasix, and she has been changed over to p.o. Lasix.  Her HD catheter is being kept in for now just to make sure that she does not need further dialysis.  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  In regards to her anemia status post major vaginal bleed, she has received a total of 5 units packed red blood cells.  Iron studies are being checked for chronic kidney disease as well.  She is on a progesterone analogue, and she will follow up with OB as an outpatient.  In regards to her osteomyelitis, she is status post 2 toe amputations on Zyvox and Primaxin.  Her white count is slowly starting to rise again.  As of September 25, 2011, her white count has increased to 13,000 when previously it was normalized. She did not yet spike another fever.   Considerations may end up being that she may have to go back to the OR for further ostia debridement. Again, she is not felt to be a candidate for revascularization.  As to her diabetes mellitus, her CBGs have since stabilized and have started to climb.  They have been in the 200s for the past 24-48 hours.  We will start her back on low-dose Lantus plus sliding scale.  In regards to her respiratory failure, it is felt to be secondary to her sleep apnea.  We will continue nightly CPAP as needed.  DISCHARGE DIAGNOSES: Her discharge diagnoses to date are as follows. 1. Left foot osteomyelitis and abscess formation status post     amputation of fourth and fifth digits with debridement of necrotic     tissue. 2. Toxic metabolic encephalopathy, now resolved. 3. Acute on chronic kidney disease, stage III, now up to date IV-V. 4. Leukocytosis. 5. Anemia of chronic disease plus acute blood loss anemia. 6. Severe vaginal bleeding. 7. Diabetes mellitus type 2, uncontrolled. 8. Morbid obesity. 9. Obstructive sleep apnea with obesity hypoventilation syndrome     causing hypercarbic respiratory failure 10.Hypercarbic respiratory failure, now resolved. 11.Hypertension. 12.Gastroesophageal reflux disease. 13.Peripheral vascular disease brought on by diabetes. 14.Secondary hyperparathyroidism.     Annita Brod, M.D.     SKK/MEDQ  D:  09/25/2011  T:  09/25/2011  Job:  [id number]  Electronically Signed by Gevena Barre M.D. on 09/25/2011 05:35:18 PM

## 2011-09-26 LAB — RENAL FUNCTION PANEL
BUN: 50 mg/dL — ABNORMAL HIGH (ref 6–23)
CO2: 29 mEq/L (ref 19–32)
Calcium: 9.9 mg/dL (ref 8.4–10.5)
Creatinine, Ser: 4.37 mg/dL — ABNORMAL HIGH (ref 0.50–1.10)
GFR calc non Af Amer: 11 mL/min — ABNORMAL LOW (ref >90)

## 2011-09-26 LAB — GLUCOSE, CAPILLARY: Glucose-Capillary: 218 mg/dL — ABNORMAL HIGH (ref 70–99)

## 2011-09-26 LAB — IRON AND TIBC
Iron: 24 ug/dL — ABNORMAL LOW (ref 42–135)
Saturation Ratios: 14 % — ABNORMAL LOW (ref 20–55)
TIBC: 167 ug/dL — ABNORMAL LOW (ref 250–470)
UIBC: 143 ug/dL (ref 125–400)

## 2011-09-26 LAB — HEMOGLOBIN A1C
Hgb A1c MFr Bld: 8 % — ABNORMAL HIGH (ref <5.7)
Mean Plasma Glucose: 183 mg/dL — ABNORMAL HIGH (ref <117)

## 2011-09-26 LAB — CBC
HCT: 27.2 % — ABNORMAL LOW (ref 36.0–46.0)
MCH: 27.2 pg (ref 26.0–34.0)
MCV: 84 fL (ref 78.0–100.0)
Platelets: 370 10*3/uL (ref 150–400)
RBC: 3.24 MIL/uL — ABNORMAL LOW (ref 3.87–5.11)
RDW: 14 % (ref 11.5–15.5)

## 2011-09-26 NOTE — Progress Notes (Signed)
Catherine Williams, ALMER NO.:  [id number]  MEDICAL RECORD NO.:  [id number]  LOCATION:  [id number]                         FACILITY:  [facility]  PHYSICIAN:  Domingo Mend, M.D. DATE OF BIRTH:  [birth year]                                PROGRESS NOTE   DIAGNOSES UP TO DATE: 1. Left foot osteomyelitis and abscess formation, status post OR on     September 18, 2011, with amputation of fourth and fifth digits and     debridement of necrotic tissue. 2. Metabolic encephalopathy. 3. Acute on chronic kidney disease stage III, now with creatinine up     to 5.4 from a baseline of 1.8. 4. Leukocytosis. 5. Fever. 6. Anemia of chronic disease plus acute blood loss anemia. 7. Diabetes mellitus. 8. Morbid obesity. 9. Obstructive sleep apnea/obesity hypoventilation syndrome. 10.Hypertension. 11.Gastroesophageal reflux disease.  HOSPITAL COURSE UP TO DATE: Catherine Williams is a pleasant 50 year old obese African woman who was initially admitted to our hospital on September 13, 2011, with complaints of left foot pain.  It was determined at that time that she had a left foot cellulitis and was placed on vancomycin and Rocephin and admitted to the hospital.  First day that I took care of her, I decided to change the Rocephin over to Zosyn given her diabetes and potential Pseudomonas.  Over the course of the next 48 hours, the patient failed to improve and in fact continued to be febrile up to 103 with complaints of increased foot pain.  At that point, I decided to perform an MRI that showed osteomyelitis of the proximal and middle phalanx of the left small toe.  Probable soft tissue abscess wrapping around the lateral aspect of the proximal saline extending from dorsal to plantar surface of the proximal phalanx.  At that point, Dr. Louanne Skye with Orthopedics was consulted who took her to the operating room the next day.  He has performed amputation of the fourth and fifth digits.  He has removed  her dressing today and states that the tissues actually look clean and the fracture appears to have new granulation tissue, however, she continues to be febrile up to 103.  He called me with concerns of a systolic murmur, however, when I auscultate her I do not appreciate any murmurs. I have decided at this point to reorder blood cultures.  If these are positive, then I may consider an echo versus a TEE to rule out endocarditis.  All culture data from surgery has been negative to this point, although of course she had been on antibiotics for 4 days before surgery.  [name] is also known to have chronic kidney disease with a baseline creatinine of 3.  On admission, she had a creatinine of 2.1 which has steadily been rising up to 5.4 today.  She has never been hypotensive. A UA does show some proteinuria and an ultrasound shows no evidence for hydronephrosis and normal echogenicity with normal-sized kidneys.  She was on an ACE inhibitor and a diuretic which have both been discontinued over the course of the past 48 hours.  Today, I have asked Dr. Lorrene Reid with Nephrology to see her with further recommendations.  Acute  on chronic kidney disease plus acute blood loss anemia.  On September 19, 2011, after surgery, she was found to have a hemoglobin of 6.4.  She has been given 2 units of PRBC with a hemoglobin that has remained stable in the low 8 range.  Encephalopathy.  Today, the patient is noted to be very sleepy and lethargic.  She is known to have obstructive sleep apnea and has in fact been wearing a CPAP machine at bedtime.  At this point, I will check an ABG.  She may need to go on BiPAP for little bit if her she is acidotic or her CO2 is high.     Domingo Mend, M.D.     EH/MEDQ  D:  09/20/2011  T:  09/20/2011  Job:  [id number]  Electronically Signed by Domingo Mend M.D. on 09/26/2011 07:53:29 AM

## 2011-09-27 LAB — CBC
MCH: 27 pg (ref 26.0–34.0)
MCHC: 32.5 g/dL (ref 30.0–36.0)
Platelets: 406 10*3/uL — ABNORMAL HIGH (ref 150–400)
RDW: 14.1 % (ref 11.5–15.5)

## 2011-09-27 LAB — PREALBUMIN: Prealbumin: 19.3 mg/dL (ref 17.0–34.0)

## 2011-09-27 LAB — GLUCOSE, CAPILLARY
Glucose-Capillary: 203 mg/dL — ABNORMAL HIGH (ref 70–99)
Glucose-Capillary: 233 mg/dL — ABNORMAL HIGH (ref 70–99)

## 2011-09-27 LAB — RENAL FUNCTION PANEL
Albumin: 2.1 g/dL — ABNORMAL LOW (ref 3.5–5.2)
Calcium: 9.7 mg/dL (ref 8.4–10.5)
GFR calc Af Amer: 12 mL/min — ABNORMAL LOW (ref >90)
Phosphorus: 4.8 mg/dL — ABNORMAL HIGH (ref 2.3–4.6)
Potassium: 3.4 mEq/L — ABNORMAL LOW (ref 3.5–5.1)
Sodium: 135 mEq/L (ref 135–145)

## 2011-09-27 LAB — CULTURE, BLOOD (ROUTINE X 2)
Culture  Setup Time: 201210030910
Culture: NO GROWTH

## 2011-09-28 DIAGNOSIS — E669 Obesity, unspecified: Secondary | ICD-10-CM

## 2011-09-28 DIAGNOSIS — S98139A Complete traumatic amputation of one unspecified lesser toe, initial encounter: Secondary | ICD-10-CM

## 2011-09-28 DIAGNOSIS — M8610 Other acute osteomyelitis, unspecified site: Secondary | ICD-10-CM

## 2011-09-28 DIAGNOSIS — R5381 Other malaise: Secondary | ICD-10-CM

## 2011-09-28 LAB — CBC
HCT: 29.1 % — ABNORMAL LOW (ref 36.0–46.0)
Hemoglobin: 9.5 g/dL — ABNORMAL LOW (ref 12.0–15.0)
MCH: 26.8 pg (ref 26.0–34.0)
MCHC: 32.6 g/dL (ref 30.0–36.0)
RBC: 3.54 MIL/uL — ABNORMAL LOW (ref 3.87–5.11)

## 2011-09-28 LAB — RENAL FUNCTION PANEL
BUN: 59 mg/dL — ABNORMAL HIGH (ref 6–23)
CO2: 27 mEq/L (ref 19–32)
Calcium: 9.9 mg/dL (ref 8.4–10.5)
GFR calc Af Amer: 13 mL/min — ABNORMAL LOW (ref >90)
Glucose, Bld: 242 mg/dL — ABNORMAL HIGH (ref 70–99)
Phosphorus: 4.1 mg/dL (ref 2.3–4.6)
Potassium: 3.6 mEq/L (ref 3.5–5.1)
Sodium: 136 mEq/L (ref 135–145)

## 2011-09-28 LAB — GLUCOSE, CAPILLARY
Glucose-Capillary: 243 mg/dL — ABNORMAL HIGH (ref 70–99)
Glucose-Capillary: 272 mg/dL — ABNORMAL HIGH (ref 70–99)

## 2011-09-29 LAB — CBC
HCT: 27.3 % — ABNORMAL LOW (ref 36.0–46.0)
MCH: 26.5 pg (ref 26.0–34.0)
MCV: 82.2 fL (ref 78.0–100.0)
RBC: 3.32 MIL/uL — ABNORMAL LOW (ref 3.87–5.11)
WBC: 10.7 10*3/uL — ABNORMAL HIGH (ref 4.0–10.5)

## 2011-09-29 LAB — RENAL FUNCTION PANEL
BUN: 61 mg/dL — ABNORMAL HIGH (ref 6–23)
CO2: 30 mEq/L (ref 19–32)
Calcium: 9.7 mg/dL (ref 8.4–10.5)
Chloride: 92 mEq/L — ABNORMAL LOW (ref 96–112)
Creatinine, Ser: 4.41 mg/dL — ABNORMAL HIGH (ref 0.50–1.10)
GFR calc non Af Amer: 11 mL/min — ABNORMAL LOW (ref >90)
Glucose, Bld: 215 mg/dL — ABNORMAL HIGH (ref 70–99)

## 2011-09-29 LAB — URINALYSIS, ROUTINE W REFLEX MICROSCOPIC
Glucose, UA: NEGATIVE mg/dL
Ketones, ur: NEGATIVE mg/dL
Protein, ur: 100 mg/dL — AB
pH: 5 (ref 5.0–8.0)

## 2011-09-29 LAB — URINE MICROSCOPIC-ADD ON

## 2011-09-29 LAB — GLUCOSE, CAPILLARY
Glucose-Capillary: 241 mg/dL — ABNORMAL HIGH (ref 70–99)
Glucose-Capillary: 185 mg/dL — ABNORMAL HIGH (ref 70–99)
Glucose-Capillary: 208 mg/dL — ABNORMAL HIGH (ref 70–99)

## 2011-09-30 ENCOUNTER — Inpatient Hospital Stay (HOSPITAL_COMMUNITY): Payer: Medicaid Other

## 2011-09-30 LAB — RENAL FUNCTION PANEL
Albumin: 2.3 g/dL — ABNORMAL LOW (ref 3.5–5.2)
Calcium: 9.6 mg/dL (ref 8.4–10.5)
Creatinine, Ser: 4.06 mg/dL — ABNORMAL HIGH (ref 0.50–1.10)
GFR calc Af Amer: 14 mL/min — ABNORMAL LOW (ref >90)
GFR calc non Af Amer: 12 mL/min — ABNORMAL LOW (ref >90)
Phosphorus: 5.1 mg/dL — ABNORMAL HIGH (ref 2.3–4.6)
Sodium: 135 mEq/L (ref 135–145)

## 2011-09-30 LAB — GLUCOSE, CAPILLARY
Glucose-Capillary: 242 mg/dL — ABNORMAL HIGH (ref 70–99)
Glucose-Capillary: 292 mg/dL — ABNORMAL HIGH (ref 70–99)

## 2011-09-30 LAB — URINE CULTURE
Colony Count: NO GROWTH
Culture: NO GROWTH

## 2011-10-01 LAB — GLUCOSE, CAPILLARY
Glucose-Capillary: 282 mg/dL — ABNORMAL HIGH (ref 70–99)
Glucose-Capillary: 294 mg/dL — ABNORMAL HIGH (ref 70–99)
Glucose-Capillary: 166 mg/dL — ABNORMAL HIGH (ref 70–99)
Glucose-Capillary: 224 mg/dL — ABNORMAL HIGH (ref 70–99)

## 2011-10-01 LAB — RENAL FUNCTION PANEL
BUN: 67 mg/dL — ABNORMAL HIGH (ref 6–23)
Creatinine, Ser: 4.06 mg/dL — ABNORMAL HIGH (ref 0.50–1.10)
Glucose, Bld: 268 mg/dL — ABNORMAL HIGH (ref 70–99)
Phosphorus: 5 mg/dL — ABNORMAL HIGH (ref 2.3–4.6)
Potassium: 3.6 mEq/L (ref 3.5–5.1)

## 2011-10-02 LAB — RENAL FUNCTION PANEL
Albumin: 2.4 g/dL — ABNORMAL LOW (ref 3.5–5.2)
Calcium: 9.4 mg/dL (ref 8.4–10.5)
Chloride: 94 mEq/L — ABNORMAL LOW (ref 96–112)
Creatinine, Ser: 3.8 mg/dL — ABNORMAL HIGH (ref 0.50–1.10)
GFR calc Af Amer: 15 mL/min — ABNORMAL LOW (ref >90)
GFR calc non Af Amer: 13 mL/min — ABNORMAL LOW (ref >90)
Sodium: 137 mEq/L (ref 135–145)

## 2011-10-02 LAB — GLUCOSE, CAPILLARY: Glucose-Capillary: 219 mg/dL — ABNORMAL HIGH (ref 70–99)

## 2011-10-03 LAB — RENAL FUNCTION PANEL
Albumin: 2.5 g/dL — ABNORMAL LOW (ref 3.5–5.2)
GFR calc Af Amer: 16 mL/min — ABNORMAL LOW (ref >90)
GFR calc non Af Amer: 14 mL/min — ABNORMAL LOW (ref >90)
Phosphorus: 4.3 mg/dL (ref 2.3–4.6)
Potassium: 3.5 mEq/L (ref 3.5–5.1)
Sodium: 139 mEq/L (ref 135–145)

## 2011-10-03 LAB — GLUCOSE, CAPILLARY
Glucose-Capillary: 218 mg/dL — ABNORMAL HIGH (ref 70–99)
Glucose-Capillary: 164 mg/dL — ABNORMAL HIGH (ref 70–99)
Glucose-Capillary: 198 mg/dL — ABNORMAL HIGH (ref 70–99)

## 2011-10-04 LAB — BASIC METABOLIC PANEL
CO2: 29 mEq/L (ref 19–32)
Chloride: 96 mEq/L (ref 96–112)
Creatinine, Ser: 3.01 mg/dL — ABNORMAL HIGH (ref 0.50–1.10)

## 2011-10-04 LAB — GLUCOSE, CAPILLARY
Glucose-Capillary: 209 mg/dL — ABNORMAL HIGH (ref 70–99)
Glucose-Capillary: 257 mg/dL — ABNORMAL HIGH (ref 70–99)

## 2011-10-05 LAB — BASIC METABOLIC PANEL
CO2: 30 mEq/L (ref 19–32)
Chloride: 98 mEq/L (ref 96–112)
Sodium: 140 mEq/L (ref 135–145)

## 2011-10-05 LAB — CBC
MCV: 83.9 fL (ref 78.0–100.0)
Platelets: 277 10*3/uL (ref 150–400)
RBC: 3.3 MIL/uL — ABNORMAL LOW (ref 3.87–5.11)
WBC: 6.9 10*3/uL (ref 4.0–10.5)

## 2011-10-05 LAB — GLUCOSE, CAPILLARY
Glucose-Capillary: 186 mg/dL — ABNORMAL HIGH (ref 70–99)
Glucose-Capillary: 236 mg/dL — ABNORMAL HIGH (ref 70–99)

## 2011-10-06 ENCOUNTER — Inpatient Hospital Stay (HOSPITAL_COMMUNITY)
Admission: RE | Admit: 2011-10-06 | Discharge: 2011-10-13 | DRG: 945 | Disposition: A | Discharge status: Home Health | Payer: Medicaid Other | Source: Other Acute Inpatient Hospital | Attending: Physical Medicine & Rehabilitation | Admitting: Physical Medicine & Rehabilitation

## 2011-10-06 DIAGNOSIS — E1165 Type 2 diabetes mellitus with hyperglycemia: Secondary | ICD-10-CM

## 2011-10-06 DIAGNOSIS — J449 Chronic obstructive pulmonary disease, unspecified: Secondary | ICD-10-CM | POA: Diagnosis present

## 2011-10-06 DIAGNOSIS — S98919A Complete traumatic amputation of unspecified foot, level unspecified, initial encounter: Secondary | ICD-10-CM

## 2011-10-06 DIAGNOSIS — N189 Chronic kidney disease, unspecified: Secondary | ICD-10-CM | POA: Diagnosis present

## 2011-10-06 DIAGNOSIS — K219 Gastro-esophageal reflux disease without esophagitis: Secondary | ICD-10-CM | POA: Diagnosis present

## 2011-10-06 DIAGNOSIS — E1169 Type 2 diabetes mellitus with other specified complication: Secondary | ICD-10-CM | POA: Diagnosis present

## 2011-10-06 DIAGNOSIS — J4489 Other specified chronic obstructive pulmonary disease: Secondary | ICD-10-CM | POA: Diagnosis present

## 2011-10-06 DIAGNOSIS — M869 Osteomyelitis, unspecified: Secondary | ICD-10-CM | POA: Diagnosis present

## 2011-10-06 DIAGNOSIS — G4733 Obstructive sleep apnea (adult) (pediatric): Secondary | ICD-10-CM | POA: Diagnosis present

## 2011-10-06 DIAGNOSIS — I129 Hypertensive chronic kidney disease with stage 1 through stage 4 chronic kidney disease, or unspecified chronic kidney disease: Secondary | ICD-10-CM | POA: Diagnosis present

## 2011-10-06 DIAGNOSIS — M9080 Osteopathy in diseases classified elsewhere, unspecified site: Secondary | ICD-10-CM | POA: Diagnosis present

## 2011-10-06 DIAGNOSIS — S98139A Complete traumatic amputation of one unspecified lesser toe, initial encounter: Secondary | ICD-10-CM

## 2011-10-06 DIAGNOSIS — I509 Heart failure, unspecified: Secondary | ICD-10-CM | POA: Diagnosis present

## 2011-10-06 DIAGNOSIS — D631 Anemia in chronic kidney disease: Secondary | ICD-10-CM | POA: Diagnosis present

## 2011-10-06 DIAGNOSIS — J9610 Chronic respiratory failure, unspecified whether with hypoxia or hypercapnia: Secondary | ICD-10-CM | POA: Diagnosis present

## 2011-10-06 DIAGNOSIS — I70269 Atherosclerosis of native arteries of extremities with gangrene, unspecified extremity: Secondary | ICD-10-CM

## 2011-10-06 DIAGNOSIS — Z7982 Long term (current) use of aspirin: Secondary | ICD-10-CM

## 2011-10-06 DIAGNOSIS — Z5189 Encounter for other specified aftercare: Principal | ICD-10-CM

## 2011-10-06 DIAGNOSIS — N179 Acute kidney failure, unspecified: Secondary | ICD-10-CM | POA: Diagnosis present

## 2011-10-06 DIAGNOSIS — E669 Obesity, unspecified: Secondary | ICD-10-CM

## 2011-10-06 DIAGNOSIS — Z87891 Personal history of nicotine dependence: Secondary | ICD-10-CM

## 2011-10-06 LAB — GLUCOSE, CAPILLARY
Glucose-Capillary: 181 mg/dL — ABNORMAL HIGH (ref 70–99)
Glucose-Capillary: 214 mg/dL — ABNORMAL HIGH (ref 70–99)
Glucose-Capillary: 200 mg/dL — ABNORMAL HIGH (ref 70–99)

## 2011-10-06 LAB — BASIC METABOLIC PANEL
BUN: 58 mg/dL — ABNORMAL HIGH (ref 6–23)
CO2: 28 mEq/L (ref 19–32)
Chloride: 99 mEq/L (ref 96–112)
Creatinine, Ser: 2.87 mg/dL — ABNORMAL HIGH (ref 0.50–1.10)
Potassium: 3.5 mEq/L (ref 3.5–5.1)

## 2011-10-06 LAB — CBC
HCT: 28.1 % — ABNORMAL LOW (ref 36.0–46.0)
MCV: 83.6 fL (ref 78.0–100.0)
Platelets: 246 10*3/uL (ref 150–400)
RBC: 3.36 MIL/uL — ABNORMAL LOW (ref 3.87–5.11)
WBC: 6.7 10*3/uL (ref 4.0–10.5)

## 2011-10-06 LAB — DIFFERENTIAL
Lymphocytes Relative: 35 % (ref 12–46)
Lymphs Abs: 2.3 10*3/uL (ref 0.7–4.0)
Neutrophils Relative %: 52 % (ref 43–77)

## 2011-10-07 DIAGNOSIS — E1165 Type 2 diabetes mellitus with hyperglycemia: Secondary | ICD-10-CM

## 2011-10-07 DIAGNOSIS — I70269 Atherosclerosis of native arteries of extremities with gangrene, unspecified extremity: Secondary | ICD-10-CM

## 2011-10-07 DIAGNOSIS — E669 Obesity, unspecified: Secondary | ICD-10-CM

## 2011-10-07 DIAGNOSIS — S98919A Complete traumatic amputation of unspecified foot, level unspecified, initial encounter: Secondary | ICD-10-CM

## 2011-10-07 LAB — DIFFERENTIAL
Basophils Absolute: 0.1 10*3/uL (ref 0.0–0.1)
Basophils Relative: 1 % (ref 0–1)
Eosinophils Absolute: 0.3 10*3/uL (ref 0.0–0.7)
Eosinophils Relative: 4 % (ref 0–5)
Monocytes Absolute: 0.5 10*3/uL (ref 0.1–1.0)
Neutro Abs: 3.6 10*3/uL (ref 1.7–7.7)

## 2011-10-07 LAB — GLUCOSE, CAPILLARY: Glucose-Capillary: 254 mg/dL — ABNORMAL HIGH (ref 70–99)

## 2011-10-07 LAB — CBC
Hemoglobin: 8.6 g/dL — ABNORMAL LOW (ref 12.0–15.0)
MCH: 26.4 pg (ref 26.0–34.0)
MCHC: 31.5 g/dL (ref 30.0–36.0)
Platelets: 232 10*3/uL (ref 150–400)
RDW: 14.5 % (ref 11.5–15.5)

## 2011-10-07 LAB — COMPREHENSIVE METABOLIC PANEL
ALT: 11 U/L (ref 0–35)
AST: 16 U/L (ref 0–37)
Albumin: 2.6 g/dL — ABNORMAL LOW (ref 3.5–5.2)
Alkaline Phosphatase: 102 U/L (ref 39–117)
Calcium: 9.7 mg/dL (ref 8.4–10.5)
Potassium: 3.6 mEq/L (ref 3.5–5.1)
Sodium: 139 mEq/L (ref 135–145)
Total Protein: 7.3 g/dL (ref 6.0–8.3)

## 2011-10-08 DIAGNOSIS — I70269 Atherosclerosis of native arteries of extremities with gangrene, unspecified extremity: Secondary | ICD-10-CM

## 2011-10-08 DIAGNOSIS — E669 Obesity, unspecified: Secondary | ICD-10-CM

## 2011-10-08 DIAGNOSIS — E1165 Type 2 diabetes mellitus with hyperglycemia: Secondary | ICD-10-CM

## 2011-10-08 DIAGNOSIS — S98919A Complete traumatic amputation of unspecified foot, level unspecified, initial encounter: Secondary | ICD-10-CM

## 2011-10-08 LAB — GLUCOSE, CAPILLARY: Glucose-Capillary: 159 mg/dL — ABNORMAL HIGH (ref 70–99)

## 2011-10-09 LAB — GLUCOSE, CAPILLARY
Glucose-Capillary: 184 mg/dL — ABNORMAL HIGH (ref 70–99)
Glucose-Capillary: 179 mg/dL — ABNORMAL HIGH (ref 70–99)
Glucose-Capillary: 165 mg/dL — ABNORMAL HIGH (ref 70–99)

## 2011-10-10 DIAGNOSIS — S98919A Complete traumatic amputation of unspecified foot, level unspecified, initial encounter: Secondary | ICD-10-CM

## 2011-10-10 DIAGNOSIS — E669 Obesity, unspecified: Secondary | ICD-10-CM

## 2011-10-10 DIAGNOSIS — E1165 Type 2 diabetes mellitus with hyperglycemia: Secondary | ICD-10-CM

## 2011-10-10 DIAGNOSIS — I70269 Atherosclerosis of native arteries of extremities with gangrene, unspecified extremity: Secondary | ICD-10-CM

## 2011-10-10 LAB — GLUCOSE, CAPILLARY
Glucose-Capillary: 162 mg/dL — ABNORMAL HIGH (ref 70–99)
Glucose-Capillary: 240 mg/dL — ABNORMAL HIGH (ref 70–99)
Glucose-Capillary: 221 mg/dL — ABNORMAL HIGH (ref 70–99)

## 2011-10-11 LAB — GLUCOSE, CAPILLARY
Glucose-Capillary: 186 mg/dL — ABNORMAL HIGH (ref 70–99)
Glucose-Capillary: 260 mg/dL — ABNORMAL HIGH (ref 70–99)

## 2011-10-11 NOTE — Progress Notes (Signed)
Catherine Williams, Catherine Williams NO.:  [id number]  MEDICAL RECORD NO.:  [id number]  LOCATION:  [id number]                         FACILITY:  [facility]  PHYSICIAN:  Estill Cotta, MD       DATE OF BIRTH:  1961/04/06                                PROGRESS NOTE   This interim summary/progress note will cover events from September 28, 2011, to current date, October 04, 2011.  PRIMARY CARE PHYSICIAN: Dr. Pia Mau.  CONSULTANTS: Currently on board are, 1. Jessy Oto, MD, Orthopedic Surgery. 2. Theotis Burrow IV, MD, Vascular Surgery.  RADIOLOGICAL DATA: 1. October 01, 2011, examination degraded by the patient's body     habitus and inability to fit in the MRI scanner.  Two solitary     vessel runoff to the right lower leg via anterior tibial artery.     Two-vessel runoff to the left lower leg via the anterior and     posterior tibial arteries.  There may be a focal high-grade     stenosis of the proximal aspect of the left posterior tibial     artery, however, the vessel appears patent to the level of the     hindfoot.  Chest x-ray on October 03, 2011, no acute findings.  HOSPITAL COURSE: September 26, 2011, onwards, please refer to the detailed progress note by Dr. Gevena Barre on September 25, 2011, for the hospitalization course until September 25, 2011. 1. Acute-on-chronic kidney disease stage IV.  The patient has been     closely followed by Renal Service.  The patient has bean off     dialysis.  Dialysis catheter was removed on September 27, 2011.  Foley     catheter was also removed.  The patient has continued to make urine     now on Lasix 80 mg b.i.d.  Continue to avoid any nephrotoxic drugs     or any contrast. 2. Anemia, likely secondary to anemia of chronic disease and vaginal     bleeding during the hospitalization.  The patient received Venofer     and also on Aranesp.  We will recheck CBC in a.m.  Hemoglobin has     been stable. 3. Hypertension, improving.  The  patient did require medications     adjustments during the hospitalization, currently on Norvasc,     Lopressor, and Lasix. 4. Diabetes mellitus, insulin dependent.  Continue Lantus and sliding     scale insulin. 5. Left foot osteomyelitis status post fourth and fifth toe     amputation.  Orthopedics has been following very closely.  Vascular     Surgery was reconsulted to follow regarding if possible     revascularization.  The patient underwent MRA, which showed     possible focal high-grade stenosis of the proximal aspect of the     left posterior vertebral artery.  The patient was given the options     of revascularization or endarterectomy, however, she remains at     high risk for needing a BKA in future.  Per Dr. Trula Slade from     Vascular Surgery, creatinine function needs to be stabilized for a  week or so before attempting revascularization.  The creatinine     function has been steadily improving. 6. Disposition, currently unclear.  Inpatient rehab was consulted and     is awaiting final decision on further management from Orthopedics     and Vascular Surgery.  PHYSICAL EXAMINATION: VITAL SIGNS:  At time of my dictation, temperature 98.0, pulse 81, respirations 21, blood pressure 135/71, O2 sats 97% on room air. GENERAL:  The patient is alert and awake, not in any acute distress. HEENT:  Anicteric sclerae.  Pink conjunctivae.  Pupils reactive to light and accommodation.  EOMI. NECK:  Supple. CVS:  S1, S2 clear. CHEST:  Decreased breath sounds at bases, otherwise fairly clear. ABDOMEN:  Morbidly obese, nontender, nondistended. EXTREMITIES:  Left foot wrapped.  Edema present.     Estill Cotta, MD     RR/MEDQ  D:  10/04/2011  T:  10/04/2011  Job:  [id number]  cc:   Jessy Oto, M.D. [phone]  V. Leia Alf, MD  Electronically Signed by Nira Conn Barbera Perritt  on 10/11/2011 01:46:22 PM

## 2011-10-11 NOTE — Consult Note (Signed)
Catherine Williams, SKIPWITH NO.:  [id number]  MEDICAL RECORD NO.:  [id number]  LOCATION:  2111                         FACILITY:  [facility]  PHYSICIAN:  Elzie Rings. Lorrene Reid, M.D.DATE OF BIRTH:  Mar 05, 1961  DATE OF CONSULTATION:  09/20/2011 DATE OF DISCHARGE:                                CONSULTATION   PRIMARY TEAM:  Pulmonary Critical Care Medicine.  HISTORY OF PRESENT ILLNESS:  This is a 50 year old female admitted on September 13, 2011 for left lower extremity osteomyelitis/gangrene, also with medical history significant for obesity, type 2 diabetes, CHF, GERD, CKD stage III, now status post left fourth and fifth digit amputation at metatarsal neck level on September 18, 2011, also with metabolic encephalopathy and fever.    With regard to her chronic kidney disease, the patient's presumed baseline creatinine is 1.7 from September 09, 2011, also with creatinine 1.6-1.8 December 13, 2010 to December 16, 2010.   On admission, the patient's creatinine was already elevated to 2.11 with potassium of 4.1.  Creatinine then continued to rise to 2.54 on September 15, 2011; 2.89 on September 16, 2011; 3.14 on September 17, 2011; 3.20 on September 18, 2011; 5.08 on September 19, 2011; and finally 5.45 on September 20, 2011.  The patient's potassium on September 20, 2011 was 4.3.  The patient's BUN has also been increasing from 22-38. Last urine output recorded was on September 14, 2011 of 400 mL.    Chest x- ray today showing cardiomegaly with vascular congestion and possible early edema.  Throughout this hospital course, the patient has not had any hypotension. She has, however, had significant vaginal bleeding with a drop in hemoglobin.  Urinalysis on September 15, 2011 had 100 protein and large blood, however, this may be a vaginal contaminant and secondary to bleeding.  The patient had renal ultrasound on September 15, 2011 showing no hydronephrosis and stable right renal  lesion and a probable cyst when compared to a CT abdomen of September 2010.  The patient was last given her hydrochlorothiazide on September 19, 2011.  Lisinopril 20 was last given on September 17, 2011.  Lasix 20 was last given today. The patient is also on vancomycin and last level on September 20, 2011 was 39.3.  The patient is followed by Dr. Marval Regal at [facility].  Last outpatient visit was on February 15, 2011 where her creatinine was 1.4.  PAST MEDICAL HISTORY: 1. Type 2 diabetes. 2. COPD. 3. OSA on CPAP. 4. CKD stage III. 5. Iron-deficiency anemia. 6. Morbid obesity. 7. GERD. 8. CHF, ejection fraction 45-50% in May 2010. 9. Hypertension. 10.Secondary hyperparathyroidism.  CURRENT MEDICATIONS: 1. Amlodipine 10. 2. Aspirin 81. 3. Lasix 30 daily. 4. Isosorbide 30. 5. Lopressor 50 b.i.d. 6. Protonix 40. 7. Zosyn. 8. Vancomycin. 9. Crestor 20.  PHYSICAL EXAMINATION:  VITAL SIGNS:  Temperature 102.2, blood pressure 141/62, pulse 93, respiratory rate 27, and O2 86% on BiPAP, CBG 130 (was 45-75 prior to being given glucagon and D50). GENERAL:  Asleep on BiPAP, not responsive. HEENT:  BiPAP in place, eyes closed. CV:  Distant heart sounds, regular rate and rhythm.  No murmur. PULMONARY:  Transmitted bypass noises.  No wheezes or  crackles. ABDOMEN:  Soft umbilicus and palpable mass bladder versus uterus. EXTREMITIES:  Trace pitting edema, warm, well perfused.  Left foot wrapped.  LABORATORY DATA:  CBC:  16.0/8.1/25.7/326.  BMET: 137/4.3/103/21/38/5.45/127.  Calcium 8.8.  ABG on 50% FIO2 of 7.276/47.9/187/21.5 with a base deficit of 4.2, 99% oxygen.  Vanc level was 39.3.  ASSESSMENT AND PLAN:  This is a 50 year old female, status post left fourth and fifth digit amputation and metabolic encephalopathy, continued fevers, and acute on chronic kidney disease. 1. Acute on chronic kidney disease.  Creatinine increasing from     baseline of 1.7 on admission,  was 2.1, now 5.45.  The patient has     been questionably anuric with no urine output recorded for the past     5-6 days.  We will have a Foley catheter inserted to see if the     patient is truly anuric versus has a full bladder causing     hydronephrosis and further insult to the kidneys.  Further workup     will depend on whether the patient has a full bladder versus no     urine.  We will also discontinue Lasix, and we could consider     discontinuing Protonix as this could have negative effect on renal     function.  We need a repeat urinalysis once Foley catheter is     inserted because of large rbc's.  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  The patient with history of secondary     hyperparathyroidism from chronic kidney disease.  We will check a     PTH, although this is likely completely unrelated to the patient's     current acute kidney injury. 2. Vaginal bleeding.  The patient with anemia, now status post 2 units     of PRBCs despite minimal surgical blood loss.  The patient is     actively having vaginal bleeding with significant clot as witnessed     in the ICU after transfer.  Primary team could consider     abdomen/pelvis CT versus ultrasound especially if abdominal mass     does not go way after bladder decompression with Foley catheter. 3. Altered mental status.  The patient is febrile and acidotic,     doubtful with a BUN of 38 that this is from uremia.  Primary team     treat other causes for her attributing to increased BUN.  No     emergent indications for dialysis at this time.    ______________________________ Lorin Glass, MD   ______________________________ Elzie Rings Lorrene Reid, M.D.    JM/MEDQ  D:  09/20/2011  T:  09/20/2011  Job:  [id number]  Electronically Signed by Lorin Glass MD on 09/24/2011 04:42:50 PM Electronically Signed by Jamal Maes M.D. on  10/11/2011 09:46:34 PM

## 2011-10-12 LAB — GLUCOSE, CAPILLARY
Glucose-Capillary: 226 mg/dL — ABNORMAL HIGH (ref 70–99)
Glucose-Capillary: 215 mg/dL — ABNORMAL HIGH (ref 70–99)
Glucose-Capillary: 147 mg/dL — ABNORMAL HIGH (ref 70–99)

## 2011-10-13 LAB — GLUCOSE, CAPILLARY: Glucose-Capillary: 191 mg/dL — ABNORMAL HIGH (ref 70–99)

## 2011-10-14 NOTE — Discharge Summary (Signed)
Catherine Williams, Catherine Williams NO.:  [id number]  MEDICAL RECORD NO.:  [id number]  LOCATION:  [id number]                         FACILITY:  [facility]  PHYSICIAN:  Verneita Griffes, MD        DATE OF BIRTH:  Oct 14, 1961  DATE OF ADMISSION:  09/13/2011 DATE OF DISCHARGE:  10/06/2011                              DISCHARGE SUMMARY   DISCHARGE DIAGNOSES ARE AS FOLLOWS: 1. 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     a.     High-grade focal stenosis per MRA of proximal aspect of left      posterior tibial artery, but appears patent at the level of the      hindfoot - for possible vascular surgeries bypass surgery when      creatinine stabilizes. 2. Toxic metabolic encephalopathy, now resolved. 3. Acute on chronic kidney disease, stage IV to V. 4. Leukocytosis. 5. Anemia.     a.     Chronic disease.     b.     Acute blood loss anemia secondary to vaginal bleeding. 6. Diabetes mellitus type 2, better controlled. 7. Morbid obesity. 8. Obstructive sleep apnea with obesity hypoventilation syndrome     causing hypercarbic respiratory failure - currently now on CPAP at     home. 9. Hypertension, moderately well controlled. 10.Gastroesophageal reflux. 11.Peripheral vascular disease. 12.Secondary hyperparathyroidism.  CONSULTANTS:  Consultants involved in her care have been as follows: Leia Alf, MD, Jessy Oto, M.D., Elzie Rings. Lorrene Reid, M.D., Chesley Mires, MD  DISCHARGE MEDICATIONS:  As follows. 1. Tylenol 650 p.o. q.6 hours p.r.n. 2. Amlodipine 10 mg 1 tab daily. 3. Multivitamins therapeutic 1 tab daily. 4. Enteric-coated aspirin 81 mg daily. 5. AcipHex 20 mg 1 tab daily. 6. Fish oil 3 tabs daily.  NEW MEDICATIONS:  Include: 1. Atrovent 3.5 mg q.2 h. p.r.n. 2. Benzonatate 100 t.i.d. p.r.n. 3. Hydroxyzine 25 mg q.8. p.r.n. 4. Glucerna 237 mL t.i.d. 5. Colace enema one each  p.r.n. 6. Aranesp 100 mcg q.14 day (the patient does not appear to have any     of the same during this specific hospitalization and will be given     a dose prior to discharge). 7. Insulin aspart 7 units t.i.d. with meals.  Please note, I have     discontinued her Lantus at home and Novolin sliding scale at home.     She is also going to be discharged on 38 units of Lantus at     bedtime.  Please note dosage change of Lasix from 40-80 mg b.i.d. 8. Primaxin IV 500 mg q.24 hourly for 30-day supply.  The patient will     needs 30-days worth of antibiotic therapy - could be curtailed if     she has a vascular procedure done as per above and switched to p.o.     Flagyl and Cipro after this procedure per vascular surgeon. 9. Multivitamins 1 tab daily. 10.Please note, I have discontinued isosorbide mononitrate. 11.Hydrocodone/CPAP prescription given for 24 tablets 1 tablet q.4     p.r.n.  12.Cinacalcet 30 mg daily as needed. 13.Aspirin enteric-coated 81 daily. 14.Albuterol 2.5 mg q.2 hours p.r.n. 15.Please note, she will also continue her potassium chloride and     calcium carbonate supplement suspension.  HOSPITAL COURSE:  Catherine Williams is a 50 year old African American female, who was admitted on September 25, with left foot cellulitis, treated with vanc and Rocephin initially and she suddenly decompensated.  MRI was done which noted osteomyelitis and Dr. Louanne Skye from orthopedic surgery was consulted upon.  This resulted in amputation of 4th and 5th digits of the left lower extremity.  From the standpoint of her left foot, she was seen by vascular surgery as she continued to have fever and Dr. Trula Slade was consulted regarding the same.  They currently do not think that she is a candidate for revascularization given her acute renal issues, and they will probably review her in 1 week at their office per Dr. Trula Slade verbal sign out to me.  1. She is currently on day 16 of Primaxin and antibiotics  had been     till date, Primaxin plus Zyvox.  Zyvox was  discontinued on October 04, 2011.  She had Zosyn, vancomycin on board from September 08, 2011, to September 21, 2011.  Her final disposition with regards to     her foot is pending and she seemed very anxious regarding the same.     I have explained to her to the best of my knowledge of nonsurgeon     what options would entail and I have encouraged her to discuss all     options with Dr. Trula Slade inclusive and are not limited to,     amputation of the foot versus angiography and subsequent     intervention. 2. Febrile state, this is likely secondary to the above issue and has     resolved. 3. Acute on chronic kidney disease.  Patient's baseline creatinine is     2-3.  The patient was followed by Dr. Marval Regal in the outpatient.     Dr. Lorrene Reid was consulted because the creatinine rose to 5.4.  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  Her creatinine more within the lower 2 range would    be acceptable to having vascular surgery done and we await this.     The patient will need a B-met in about 5-7 days. 4. Anemia.     a.     Thought that she has anemia of chronic disease initially,      but subsequent to surgery on the 30th hemoglobin started      declining, and the patient was lethargic.  Dr. Benjie Karvonen was consulted      because she started having large vaginal clots, and they      recommended keeping her on Provera.  She received 10 days of      Provera which stopped the bleeding, and this will need to be      revisited if this does recur.  The patient may benefit from      endometrial biopsy and Pap smear in the office at some point.  The      patient was felt to be high risk for DVT, and they did not  recommend any estrogen or  Premarin. 5. Acute respiratory decompensation - the patient was found to be     hypercarbic on October 2 and was admitted under critical care.  It     was thought that this is multifactorial and probably secondary to     multiple issues.  She was managed by critical care medicine, and it     is felt that her OSA contributing to respiratory issues, was     intubated on October 3, and while on Critical Care Service,     received 2 days of hemodialysis with vascular catheter.  The     patient's pulmonary function improved and she was transferred back     to the hospitalist service. 6. With regards to her anemia, she received 5 total units of packed     red blood cells and will need to continue on the progesterone     analog.  I had a detailed discussion with Dr. Trula Slade today regarding her care and he recommended her following up with him in the outpatient setting.  1. Diabetes mellitus.  Her blood sugars have been relatively well     controlled.  She is eating drinking well, and she will continue on     her current dosages and medications as per above.  The patient was     seen on day of discharge, was doing well, was tolerating p.o.     intake.  Temperature is 97.9, pulse 86, respirations 20, blood     pressure 128 to 142 over 70 to 75.  Saturating 98% on room air.     She will need to continue on her AutoPap for CPAP at night per     Pulmonary.  Things to do, 1. She will need to continue on AutoPAP per Pulmonary Medicine. 2. She will need a B-met in about 3-7 days to determine her creatinine     and whether this has resolved enough in order to do surgery. 3. Please note, I am placing her on another medication by the name of     medroxyprogesterone acetate 10 mg b.i.d. every 15 days to prevent     vaginal bleeding and outpatient follow up with Dr. Benjie Karvonen of OB/GYN     would be recommended. 4. The patient will receive a stat dose of Depo Provera to prevent     vaginal bleeding before  going home 150 mg and this will protect her     from bleeding for 3 months period of time.  If she has breakthrough     bleeding, please inform OB/GYN Dr. Benjie Karvonen of the same. 5. The patient will need hemoglobin in the near future.  She also     would likely benefit from being on iron, which I have placed on her     medication list iron, FESO4 of 325 mg b.i.d. for the next 2 months. 6. She will benefit from a reticulocyte count, as well with repeat     labs.  The patient was seen on day of discharge, was doing well.  Chest clinically clear.  Foot wound was noted to be a deep wound at the left 5th metatarsal area about 5 cm deep, very clean edges, no slough, no bleeding, deep into the socket of the 5th metatarsophalangeal joint.  Chest clinically clear S1,S2.  No murmurs, rubs, or gallops.  She does have a central access at present time, Vas-Cath which will need to be remained on board given the fact  that she needs Primaxin at least until surgery and then she can be tailored off her antibiotics to Cipro and Flagyl.  The chest is clinically clear.  Abdomen is soft, obese, nontender, nondistended.  No pallor, no icterus.  I spent over 45 minutes in time today discharging this patient.  It was a pleasure taking care of this patient in hospital.          ______________________________ Verneita Griffes, MD     JS/MEDQ  D:  10/06/2011  T:  10/06/2011  Job:  [id number]  cc:   Eldridge Abrahams, MD Jessy Oto, M.D. Elzie Rings Lorrene Reid, M.D. Chesley Mires, MD Vilma Prader, MD Dr. Pia Mau  Electronically Signed by Verneita Griffes MD on 10/14/2011 02:27:33 PM

## 2011-10-17 ENCOUNTER — Inpatient Hospital Stay (HOSPITAL_COMMUNITY)
Admission: AD | Admit: 2011-10-17 | Discharge: 2011-10-19 | DRG: 253 | Disposition: A | Discharge status: Home / Self Care | Payer: Medicaid Other | Source: Ambulatory Visit | Attending: Surgery | Admitting: Surgery

## 2011-10-17 ENCOUNTER — Inpatient Hospital Stay (HOSPITAL_COMMUNITY): Admission: RE | Admit: 2011-10-17 | Payer: Medicaid Other | Source: Ambulatory Visit | Admitting: Surgery

## 2011-10-17 DIAGNOSIS — Z7982 Long term (current) use of aspirin: Secondary | ICD-10-CM

## 2011-10-17 DIAGNOSIS — Z794 Long term (current) use of insulin: Secondary | ICD-10-CM

## 2011-10-17 DIAGNOSIS — N2581 Secondary hyperparathyroidism of renal origin: Secondary | ICD-10-CM | POA: Diagnosis present

## 2011-10-17 DIAGNOSIS — I129 Hypertensive chronic kidney disease with stage 1 through stage 4 chronic kidney disease, or unspecified chronic kidney disease: Secondary | ICD-10-CM | POA: Diagnosis present

## 2011-10-17 DIAGNOSIS — K219 Gastro-esophageal reflux disease without esophagitis: Secondary | ICD-10-CM | POA: Diagnosis present

## 2011-10-17 DIAGNOSIS — Z87891 Personal history of nicotine dependence: Secondary | ICD-10-CM

## 2011-10-17 DIAGNOSIS — G4733 Obstructive sleep apnea (adult) (pediatric): Secondary | ICD-10-CM | POA: Diagnosis present

## 2011-10-17 DIAGNOSIS — N189 Chronic kidney disease, unspecified: Secondary | ICD-10-CM | POA: Diagnosis present

## 2011-10-17 DIAGNOSIS — E876 Hypokalemia: Secondary | ICD-10-CM | POA: Diagnosis present

## 2011-10-17 DIAGNOSIS — E119 Type 2 diabetes mellitus without complications: Secondary | ICD-10-CM | POA: Diagnosis present

## 2011-10-17 DIAGNOSIS — I70299 Other atherosclerosis of native arteries of extremities, unspecified extremity: Principal | ICD-10-CM | POA: Diagnosis present

## 2011-10-17 DIAGNOSIS — E860 Dehydration: Secondary | ICD-10-CM

## 2011-10-17 LAB — GLUCOSE, CAPILLARY

## 2011-10-18 DIAGNOSIS — I70219 Atherosclerosis of native arteries of extremities with intermittent claudication, unspecified extremity: Secondary | ICD-10-CM

## 2011-10-18 HISTORY — PX: OTHER SURGICAL HISTORY: SHX169

## 2011-10-18 LAB — SURGICAL PCR SCREEN: MRSA, PCR: NEGATIVE

## 2011-10-18 LAB — POCT ACTIVATED CLOTTING TIME: Activated Clotting Time: 182 seconds

## 2011-10-18 LAB — COMPREHENSIVE METABOLIC PANEL
Albumin: 2.6 g/dL — ABNORMAL LOW (ref 3.5–5.2)
Alkaline Phosphatase: 110 U/L (ref 39–117)
BUN: 47 mg/dL — ABNORMAL HIGH (ref 6–23)
Chloride: 98 mEq/L (ref 96–112)
Potassium: 3.2 mEq/L — ABNORMAL LOW (ref 3.5–5.1)
Total Bilirubin: 0.3 mg/dL (ref 0.3–1.2)

## 2011-10-18 LAB — BASIC METABOLIC PANEL
BUN: 42 mg/dL — ABNORMAL HIGH (ref 6–23)
CO2: 27 mEq/L (ref 19–32)
Chloride: 101 mEq/L (ref 96–112)
Creatinine, Ser: 2.04 mg/dL — ABNORMAL HIGH (ref 0.50–1.10)
Glucose, Bld: 177 mg/dL — ABNORMAL HIGH (ref 70–99)

## 2011-10-18 LAB — CBC
HCT: 26.4 % — ABNORMAL LOW (ref 36.0–46.0)
MCV: 83 fL (ref 78.0–100.0)
RBC: 3.18 MIL/uL — ABNORMAL LOW (ref 3.87–5.11)
WBC: 7 10*3/uL (ref 4.0–10.5)

## 2011-10-18 LAB — GLUCOSE, CAPILLARY
Glucose-Capillary: 203 mg/dL — ABNORMAL HIGH (ref 70–99)
Glucose-Capillary: 185 mg/dL — ABNORMAL HIGH (ref 70–99)

## 2011-10-18 NOTE — Consult Note (Signed)
Catherine Williams, Catherine Williams NO.:  [id number]  MEDICAL RECORD NO.:  [id number]  LOCATION:  2111                         FACILITY:  [facility]  PHYSICIAN:  Theotis Burrow IV, MDDATE OF BIRTH:  July 08, 1961  DATE OF CONSULTATION:  09/20/2011 DATE OF DISCHARGE:                                CONSULTATION   REASON FOR CONSULT:  Peripheral vascular disease and cellulitis, left lower extremity with gangrenous change of left foot.  HISTORY OF PRESENT ILLNESS:  This is a 50 year old morbidly obese female, who her family states she started developing pain in her left foot and [facility] to [facility] at which time they told that her leg appeared normal and stated that was arthritis.  They did not obtain any x-rays at that time per the family and she was sent home.  The patient's family also states that she had a corn on her left fifth toe that she had been applying antibiotics to.  Since being [facility] home from [facility], her left lower extremity started swelling and her pain worsened and she was having difficulty walking secondary to this pain.  She was admitted to the hospital with fevers as high as 103 and cellulitis of her left lower extremity.  She was found to have leg gangrenous changes involving the left little toe with bullae changes and soft tissue infection of the left fourth toe and forefoot.  Orthopaedics was consulted and she then underwent a left fifth and fourth digit amputation at the metatarsal neck level and debridement of forefoot abscess on September 18, 2011.  Ortho has checked the wound and the tissues.  According to them, it looked clean with good granulation tissue.  However, the patient continued to be febrile.  Her blood cultures have been negative, but they have been reordered and per the note on the chart, if the blood cultures are positive an  echo will be ordered.  She has been on antibiotics for 4 days.  She also has chronic kidney disease and her  creatinine has been slowly rising from 2.1 on admission, it was 5.4 today and will less likely need hemodialysis. While seeing this patient, she has also passed  large vaginal clot and a gynecologic consult has also been obtained.  ABIs in her right leg was 0.97; however, the last ABIs were unable to be obtained due to pain and surgical bandages.  PVS was consulted for possible peripheral vascular disease.  PAST MEDICAL/PAST SURGICAL HISTORY: 1. Wet gangrene and cellulitis of left foot, status post left fourth     and fifth toe amp on September 18, 2011. 2. Chronic kidney disease with increasing creatinine. 3. COPD on home O2. 4. Morbid obesity with a BMI greater than 40. 5. History of CHF. 6. Diabetes. 7. Chronic respiratory failure. 8. Obstructive sleep apnea and use of CPAP. 9. Hypertension. 10.GERD. 11.Metabolic encephalopathy. 12.Remote tobacco use. 13.History of oophorectomy on the left. 14.History of right carpal tunnel release. 15.Recent passing of large vaginal clot.  ALLERGIES:  No known drug allergies.  CURRENT HOSPITAL MEDICATIONS: 1. Norvasc. 2. Aspirin. 3. Lasix. 4. Lupron. 5. Isosorbide dinitrate. 6. Lopressor. 7. Narcan. 8. Protonix. 9. Zosyn. 10.Crestor. 11.Vancomycin.  SOCIAL HISTORY:  She was at home with her daughter.  She denies EtOH or illicit dug use and does have remote tobacco use.  FAMILY HISTORY:  Positive for diabetes and hypertension.  REVIEW OF SYSTEMS:  As per family, GENERAL:  She denies any fevers at home.  PERIPHERAL VASCULAR:  She denies any complaints of claudication or rest pain.  She states that she had a corn on her left fifth toe that she was using antibiotic ointments for.  NEURO:  Denies any history of CVA.  CARDIAC:  Denies history of MI or chest pain.  PULMONARY:  She does have COPD and is on home O2.  She does have obstructive sleep apnea.  GI:  She has a history of GERD.  ENDOCRINE:  Positive for diabetes.  PHYSICAL  EXAMINATION:  VITAL SIGNS:  Maximum temperature is 103, now 102.2; blood pressure is 140/60; heart rate 93; she is 86% on BiPAP. GENERAL:  She is in mild distress, on BiPAP machine. HEART:  Regular rate and rhythm. LUNGS:  Positive rhonchi throughout. ABDOMEN:  Obese, soft, nontender, nondistended. NEURO:  She follows commands. EXTREMITIES:  She has a brisk positive Doppler signal in her right dorsalis pedal pulse.  There is a questionable Doppler signal on the left dorsalis pedal or anterior tibial pulse.  She had a recent left fourth and fifth left toe amputation, which now has some exudate and granulation tissue, and the wound is packed.  Her remaining toes on the left are with edema and erythema of the toes and forefoot.  ASSESSMENT AND PLAN:  This is a 50 year old morbidly obese female with recent cellulitis and wet gangrene.  She is status post fourth and fifth left toe amputation by Orthopedics on September 18, 2011.  ABIs were unable to be obtained due to pain.  Given the patient's worsening renal function and abnormal critical status, we will hold on ordering a CTA or arteriogram for now.  Dr. Trula Slade will be advised to see the patient for further recommendation.     Evorn Gong, PA   ______________________________ V. Leia Alf, MD    SE/MEDQ  D:  09/20/2011  T:  09/21/2011  Job:  [id number]  Electronically Signed by Evorn Gong PA on 09/28/2011 01:13:03 PM Electronically Signed by Orvan Falconer IV MD on 10/18/2011 09:47:29 PM

## 2011-10-19 LAB — BASIC METABOLIC PANEL
BUN: 34 mg/dL — ABNORMAL HIGH (ref 6–23)
Calcium: 9.3 mg/dL (ref 8.4–10.5)
GFR calc non Af Amer: 30 mL/min — ABNORMAL LOW (ref >90)
Glucose, Bld: 117 mg/dL — ABNORMAL HIGH (ref 70–99)

## 2011-10-20 NOTE — Op Note (Addendum)
Catherine Williams, Catherine Williams NO.:  [id number]  MEDICAL RECORD NO.:  [id number]  LOCATION:                                 FACILITY:  PHYSICIAN:  Theotis Burrow IV, MDDATE OF BIRTH:  [birth year]  DATE OF PROCEDURE:  10/18/2011 DATE OF DISCHARGE:                              OPERATIVE REPORT   PREOPERATIVE DIAGNOSIS:  Left toe ulcer.  POSTOPERATIVE DIAGNOSIS:  Left toe ulcer.  PROCEDURES PERFORMED: 1. Ultrasound access, right femoral artery. 2. Abdominal aortogram with CO2. 3. Left leg runoff. 4. Additional order catheterization (posterior tibial artery). 5. Atherectomy, left posterior tibial artery. 6. Angioplasty, left posterior tibial artery. 7. Intraarterial administration of nitroglycerin.  INDICATION:  This is a 50 year old female who presented with ischemic changes to her lateral toes on her left foot.  She was taken the operating room by Dr. Louanne Skye, underwent amputation.  Poor bleeding was evident at the time of the operation.  Postprocedure, the patient became septic from a GYN source.  She also went into acute renal failure.  An MRA was obtained which showed a focal posterior tibial stenosis. Because the patient's wound had been stable and her creatinine had been trending down, we have been following this, waiting for the right time to proceed with intervention.  She comes in today for that.  The risks have been discussed with the patient as well as the benefits including the risk of worsening renal disease, the inability to fix this percutaneously, and the need for possible higher amputation.  PROCEDURE:  The patient was identified in the holding area, taken to room 7, placed supine on the table.  The right groin was prepped and draped in usual fashion.  Time-out was called.  The right femoral artery was accessed and was evaluated with ultrasound, found to be widely patent.  A digital ultrasound image was acquired.  The right femoral artery was then  accessed under ultrasound guidance with an 18-gauge needle.  An 0.35 wire was advanced into the aorta under fluoroscopic visualization.  A 5-French sheath was placed over the wire, and Omni Flush catheter was advanced to the level of L1 and an abdominal aortogram was obtained using CO2.  The catheter was pulled down to the aortic bifurcation, and pelvic angiogram with CO2 was performed.  Next, using the Omni Flush catheter and a Bentson wire, the aortic bifurcation was crossed.  Catheter was placed in the left external iliac artery, and contrast injections with CO2 were performed down to the level of the knee.  I then used a long straight catheter in the popliteal artery to get the remaining portion of the left leg runoff, this time using a small amount of contrast.  FINDINGS:  Aortogram:  The visualized portions of the suprarenal abdominal aorta showed no significant disease.  There is no evidence of renal artery stenosis.  Bilateral common, external, and iliac arteries are widely patent.  Left lower extremity:  Left common femoral artery is widely patent. Left profunda femoral artery is widely patent.  Left superficial femoral artery is widely patent.  The left popliteal artery is widely patent. The patient does have severe tibial disease.  The peroneal artery  is occluded just near its origin.  The anterior tibial artery is patent proximally, however, occludes in the lower leg.  The posterior tibial artery is a dominant vessel across the foot.  There is a focal high- grade stenosis near its origin.  At this point, decision was made to proceed with intervention.  I used a long Versacore wire and placed a 6- French 90 cm sheath into the popliteal artery.  Then, using a 0.14 wire, access was gained into the posterior tibial artery.  The patient was fully heparinized.  This was corroborated with an ACT.  Viper wire was then placed into the posterior tibial artery.  I proceeded  with atherectomy of the posterior tibial artery.  This was done with a 1.25 CSI device.  I then performed balloon angioplasty using a 3 x 2 Fox SV balloon taking it to 4 atmospheres and holding it up for 2 minutes. Completion angiogram revealed widely patent posterior tibial artery with resolution of the stenosis.  I then administered 400 mcg of nitroglycerin and obtained imaging of the foot.  This revealed runoff similar to preintervention, but much more brisk in filling of the vessels.  At this point in time, the decision was made to terminate the procedure.  Catheters and wires were removed.  The patient was taken to holding area for a sheath pull.  IMPRESSION: 1. 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 2. Occluded peroneal and anterior tibial arteries.  The posterior     tibial is the dominant runoff vessel.  The patient does not have     significant aortoiliac occlusive disease.  A total of 38 mL of contrast was used.     Eldridge Abrahams, MD     VWB/MEDQ  D:  10/18/2011  T:  10/18/2011  Job:  [id number]  Electronically Signed by Orvan Falconer IV MD on 10/20/2011 09:53:53 PM

## 2011-10-20 NOTE — Discharge Summary (Signed)
NAMESARAHA, GUGGENHEIM NO.:  [id number]  MEDICAL RECORD NO.:  [id number]  LOCATION:  [id number]                         FACILITY:  [facility]  PHYSICIAN:  Meredith Staggers, M.D.DATE OF BIRTH:  1961-10-17  DATE OF ADMISSION:  10/06/2011 DATE OF DISCHARGE:  10/13/2011                              DISCHARGE SUMMARY   DISCHARGE DIAGNOSES: 1. 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 2. Acute on chronic renal insufficiency. 3. Anemia. 4. Respiratory failure with history of chronic obstructive pulmonary     disease.  5.  Hypertension. 5. Obstructive sleep apnea. 6. Diabetes mellitus.  A 50 year old female admitted on September 13, 2011, with left foot pain and swelling.  There was question of cellulitis and placed on Rocephin as well as Zosyn.  Noted continued pain.  MRI showed osteomyelitis left 4th 5th toe.  Underwent left 4th 5th toe digit amputation at the metatarsal level with debridement of forefoot abscess September 18, 2011, by Dr. Louanne Skye.  Postoperative course complicated by lethargy, anemia, worsening renal function required dialysis for short time, creatinine returned to baseline of 2.0 to 2.8 from maximum of 5.8. Required intubation from September 21, 2011, to September 23, 2011, for acute respiratory failure.  Followup Vascular Surgery for questionable need for revascularization of left lower extremity.  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  No plan for revascularization at this time, but monitored closely as she was a higher risk for amputation.  Maintained on intravenous Primaxin September 21, 2011, x30 days for wound coverage.  She was minimal assist to ambulate with a Darco boot, she was admitted for comprehensive rehab program.  PAST MEDICAL HISTORY:  See discharge diagnoses.  Remote smoker.   No alcohol.  ALLERGIES:  None.  SOCIAL HISTORY:  Lives with her daughter.  She is disabled, two-level home, bedroom downstairs, 3 steps to entry.  Daughter works but second daughter can assist.  Functional history prior to admission was independent.  Functional status upon admission to [facility] was modified independent bed mobility, minimal assist transfers, minimal assist ambulate 15 feet, minimal assist activities of daily living.  Medications prior to admission were: 1. Aspirin 81 mg daily. 2. Potassium chloride 20 mEq daily. 3. Lasix 20 mg daily. 4. Hydrochlorothiazide 25 mg daily. 5. Imdur 30 mg daily. 6. Lipitor 40 mg daily. 7. Metoprolol 50 mg twice daily. 8. Norvasc 10 mg daily. 9. Aciphex 20 mg daily.  PHYSICAL EXAMINATION:  VITAL SIGNS:  Blood pressure 142/75, pulse 86, temperature 97.9, respirations 20. GENERAL:  This was an alert female in no acute distress, oriented x3. MUSCULOSKELETAL:  Deep tendon reflexes hypoactive sensation decreased to light touch distally.  Dry dressing intact in left foot. LUNGS:  Clear to auscultation. CARDIAC:  Regular rate and rhythm. ABDOMEN:  Soft, nontender.  Good bowel sounds.  REHABILITATION HOSPITAL COURSE:  The patient was admitted to Inpatient Rehab Services with therapies initiated on a 3-hour daily basis consisting of physical therapy, occupational therapy, and rehabilitation nursing.  The following issues were addressed during patient's rehabilitation stay.  Pertaining  to Ms. Canez, left 4th 5th toe amputation secondary to osteomyelitis September 18, 2011, she continued with dry dressing changes as advised.  Plans will be follow up with Dr. Trula Slade of Vascular Surgery for possible need for revascularization in the future.  Surgical wound did appear to be healing although slowly, she remained afebrile.  She would remain on intravenous Primaxin until October 22, 2011.  She continued with a central line in place.   Pain control managed with Vicodin and good results.  Noted acute on chronic renal insufficiency, creatinine baseline had returned to 2.9 and monitored.  She would follow up with Renal Services as needed.  Her blood pressures were monitored on Norvasc, Lasix, and/or Lopressor with no orthostatic changes.  She will continue with CPAP for obstructive sleep apnea as prior to hospital admission.  Noted chronic anemia, maintained on Aranesp.  Latest hemoglobin of 8.6, hematocrit 27.3.  Her blood pressures were well controlled, her blood sugars were well controlled on combination of NovoLog and Lantus insulin, full diabetic teaching completed.  The patient received weekly collaborative interdisciplinary team conferences to discuss estimated length of stay, family teaching, and any barriers to discharge.  She was supervision to modified independent for overall mobility, supervision for activities of daily living.  Her strength and endurance had greatly improved throughout her rehab course. Full family teaching was completed.  Ongoing therapies would be dictated as per [employer].  Latest labs showed sodium 139, potassium 3.6, BUN 60, creatinine 2.93, hemoglobin 8.6, hematocrit 27.3, platelets 232,000, WBC is 6.8.  Discharge medications at time of dictation included: 1. Norvasc 10 mg daily. 2. Sensipar 30 mg daily. 3. Lasix 80 mg twice daily. 4. NovoLog 6 units 3 times daily. 5. Imdur 30 mg daily. 6. Protonix 40 mg daily. 7. Lopressor 75 mg twice daily. 8. Lantus insulin 42 units subcutaneously every evening. 9. Primaxin 500 mg intravenously every 12 hours until October 22, 2011. and stop. 10.Vicodin 5-325 one or two tabs every 4 hours as needed for pain,     dispense of 90 tablets.  Her diet was a diabetic diet.  SPECIAL INSTRUCTIONS:  The patient should follow up with Vascular Surgery in regards to close monitoring of left 4th 5th toe amputation and possible need for  revascularization in the future.  Wet-to-dry dressings twice daily to left foot and wrap with Kerlix with a home health nurse arranged.  Ongoing therapies had to be arranged as per [employer].     Lauraine Rinne, P.A.   ______________________________ Meredith Staggers, M.D.    DA/MEDQ  D:  10/12/2011  T:  10/12/2011  Job:  [id number]  cc:   Elzie Rings. Lorrene Reid, M.D. Jessy Oto, M.D. Dr. Barton Fanny. Leia Alf, MD  Electronically Signed by Lauraine Rinne P.A. on 10/13/2011 03:18:00 PM Electronically Signed by Alger Simons M.D. on 10/20/2011 11:09:10 PM

## 2011-10-20 NOTE — H&P (Signed)
NAMEKRISTLE, Catherine Williams NO.:  [id number]  MEDICAL RECORD NO.:  [id number]  LOCATION:  [id number]                         FACILITY:  [facility]  PHYSICIAN:  Meredith Staggers, M.D.DATE OF BIRTH:  09-11-1961  DATE OF ADMISSION:  10/06/2011 DATE OF DISCHARGE:                             HISTORY & PHYSICAL   CHIEF COMPLAINT:  Left leg pain and weakness.  PRIMARY CARE PHYSICIAN:  Dr. Juanita Laster.  HISTORY OF PRESENT ILLNESS:  This is a 50 year old obese black female with diabetes and chronic renal insufficiency who was admitted on September 13, 2011 with left foot pain and swelling.  There was a question of cellulitis and the patient was placed on Rocephin and Zosyn. She was noted to have continued pain and MRI showing osteomyelitis of the left fifth and fourth toes.  She underwent fifth and fourth digit amputation at the metatarsal level with debridement of foot abscess on September 18, 2011 by Dr. Louanne Skye.  Course was complicated by lethargy, anemia, worsening renal function requiring dialysis for a short period of time.  Creatinine has return back to 2.2-2.8 baseline from a maximum of 5.8.  She required intubation from September 21, 2011 to September 23, 2011 for acute respiratory failure.  Vascular Surgery followed up with the patient regarding need for revascularization of the legs.  MRA showed focal high-grade stenosis of the proximal aspect of the left posterior tibial artery.  There is no plan for revascularization at this point, and the patient is at risk for amputation unfortunately.  She is placed on IV Primaxin.  She will then continue this for 30 days from September 21, 2011 for wound coverage.  Rehab has been following along with this patient and felt that she could benefit from inpatient rehab stay.  REVIEW OF SYSTEMS:  Notable for reflux, dysfunctional uterine bleeding, left foot pain.  Full 12-point review is in the written H and P.  PAST MEDICAL HISTORY:   Positive for: 1. COPD. 2. Obstructive sleep apnea on CPAP. 3. CHF. 4. Diabetes. 5. Hypertension. 6. Reflux. 7. Chronic renal insufficiency. 8. Chronic anemia. 9. Carpal tunnel release. 10.Remote tobacco use and negative alcohol.  FAMILY HISTORY:  Positive for stroke, CAD, and diabetes.  SOCIAL HISTORY:  The patient lives with daughter and is stable.  She lives in two-level house.  Bedroom downstairs and 3 steps to enter. Daughter works but second daughter can assist at home.  ALLERGIES:  None.  HOME MEDICATIONS:  Aspirin, K-Dur, Lasix, hydrochlorothiazide, Imdur, Lipitor, metoprolol, Norvasc, AcipHex.  LABORATORY DATA:  Hemoglobin 8.9, white count 6.7, platelets 246. Sodium 141, potassium 3.5, BUN 58, creatinine 2.87.  PHYSICAL EXAMINATION:  VITAL SIGNS:  Blood pressure 142/75, pulse 86, respiratory rate 20, temperature 97.9. GENERAL:  The patient is pleasant, alert, sitting in her chair.  She is morbidly obese. HEENT:  Pupils equal, round, and reactive to light.  Ear, nose, and throat exam is unremarkable with good dentition.  Pink moist mucosa. NECK:  Supple without JVD or lymphadenopathy. CHEST:  Clear to auscultation bilaterally without wheezes, rales, or rhonchi. HEART:  Regular rate and rhythm without murmurs, rubs, or gallops. ABDOMEN:  Soft, nontender.  Bowel sounds are positive.  SKIN:  Notable for dry flaky skin over the right leg and foot.  Left foot was dressed and had just been changed by the nurse.  I will follow up on this in the morning. NEUROLOGIC:  Cranial nerves II through XII are grossly intact.  Reflexes are 1+.  Sensation is diminished slightly in the distal right lower extremity.  Judgment, orientation, memory, mood are all appropriate. Upper extremity strength is grossly intact except for some pain inhibition at the right shoulder.  Left lower extremity is 3/5 proximally to 3+/5 distally.  Right lower extremity is 3/5 proximally to 4+/5 distally.   Right shoulder is notable for pain over the bicipital tendons particularly the long head.  POSTADMISSION PHYSICIAN EVALUATION: 1. Functional deficits secondary to left fourth and fifth toe     amputations due to osteomyelitis.  The patient with significant     peripheral vascular disease as well. 2. The patient is admitted to receive collaborative interdisciplinary     care between the physiatrist, rehab nursing staff, and therapy     team. 3. The patient's level of medical complexity and substantial therapy     needs in context of that medical necessity cannot be provided at a     lesser intensity of care. 4. The patient has experienced substantial functional loss from her     baseline.  Premorbidly, the patient had been independent.     Currently, she is modified independent bed mobility, min assist     transfer, min assist gait 15 feet, min assist with ADLs.  Judging     by the patient's diagnosis, physical exam, and functional history,     she has a potential for functional progress, which will result in     measurable gains while inpatient rehab.  These gains will be of     substantial and practical use upon discharge to home in     facilitating mobility and self-care. 5. The physiatrist will provide 24-hour management of the medical     needs as well as oversight of the therapy plan/treatment and     provide guidance as appropriate regarding interaction of the two.     Medical problem list and plan are below. 6. 24-hour rehab nursing team will assist in the management of the     patient's skin care needs as well as bowel and bladder function,     safety awareness, integration of therapy concepts, techniques, pain     management, etc. 7. PT will assess and treat for lower extremity strength, range of     motion, functional mobility, safety, with goals modified     independent. 8. OT will assess and treat for upper extremity use, ADLs, adaptive     techniques, equipment,  functional mobility, safety, ADLs with goals     modified independent. 9. Case management and social worker will assess and treat for     psychosocial issues and discharge planning. 10.Team conference will be held weekly to assess progress towards     goals and to determine barriers at discharge. 11.The patient has demonstrated sufficient medical stability and     exercise capacity to tolerate at least 3 hours of therapy per day     at least 5 days per week. 12.Estimated length of stay is 1 week.  Prognosis is good.  MEDICAL PROBLEM LIST AND PLAN: 1. Acute renal failure with baseline renal insufficiency:  Check     strict I's and O's and follow BMETs serially.  She  is showing signs     of stabilization. 2. Infectious disease:  Continue Primaxin for 30 days starting September 21, 2011.  I would continue strict wound care. 3. Anemia:  Aranesp regularly with a CBC to be checked on admission     labs.  Most recent hemoglobin is 8.9.  Observe for any effects upon     activities with PT and OT. 4. Respiratory failure/history of chronic obstructive pulmonary     disease:  Monitor respiratory status.  Check O2 sats.  She is at     risk for apnea given her body habitus. 5. Blood pressure control with Norvasc, Lasix, Imdur, and Lopressor:     Monitor with increased activity. 6. Obstructive sleep apnea: Continuous positive airway pressure. 7. Pain management with p.r.n. Vicodin.  She is tolerating this     medication better than some of the meds prior.  We will need to     watch mental status closely and also make sure she is able to     tolerate activity on the floor. 8. Type 2 diabetes:  Lantus insulin 38 units daily with NovoLog 6     units t.i.d. scheduled.  Check CBGS before meals and at bedtime and     monitor accordingly. 9. Right bicipital tendonitis:  Ice therapy t.i.d.     Meredith Staggers, M.D.     ZTS/MEDQ  D:  10/06/2011  T:  10/07/2011  Job:  [id number]  cc:   Dewitt Hoes  Dr. Ria Clock  Electronically Signed by Alger Simons M.D. on 10/20/2011 11:10:25 PM

## 2011-10-28 ENCOUNTER — Encounter: Payer: Self-pay | Admitting: Surgery

## 2011-11-04 ENCOUNTER — Encounter: Payer: Self-pay | Admitting: Surgery

## 2011-11-07 ENCOUNTER — Ambulatory Visit (INDEPENDENT_AMBULATORY_CARE_PROVIDER_SITE_OTHER): Payer: Self-pay | Admitting: *Deleted

## 2011-11-07 ENCOUNTER — Ambulatory Visit (INDEPENDENT_AMBULATORY_CARE_PROVIDER_SITE_OTHER): Payer: Self-pay | Admitting: Surgery

## 2011-11-07 ENCOUNTER — Encounter: Payer: Self-pay | Admitting: Surgery

## 2011-11-07 VITALS — BP 137/76 | HR 80 | Temp 99.0°F | Resp 18 | Ht 61.0 in | Wt 300.0 lb

## 2011-11-07 DIAGNOSIS — L98499 Non-pressure chronic ulcer of skin of other sites with unspecified severity: Secondary | ICD-10-CM

## 2011-11-07 DIAGNOSIS — I739 Peripheral vascular disease, unspecified: Secondary | ICD-10-CM

## 2011-11-07 DIAGNOSIS — Z48812 Encounter for surgical aftercare following surgery on the circulatory system: Secondary | ICD-10-CM

## 2011-11-07 NOTE — Progress Notes (Signed)
[facility]   Patient name: Catherine Williams MRN: [id number] DOB: October 08, 1961 Sex: female     Chief Complaint  Patient presents with  . Follow-up    2 weeks left posterior tibial atherectomy    HISTORY OF PRESENT ILLNESS: The patient comes back today for followup. She was seen in the hospital at the end of September and early October. Dr. Louanne Skye initially evaluated her. She had wet gangrene of the left fourth and fifth toe with bullous changes and soft tissue infection she underwent amputation. This did not bleed at the time of her operation and was slow to heal and therefore I was consulted. The patient became extremely ill from some gynecologic issues which required intubation. She developed acute renal failure which did resolve. Ultimately on October 31 I took her for arteriogram she was found to have a focal stenosis within the posterior tibial artery she underwent atherectomy and angioplasty of her left posterior tibial artery. She is now at home performing wet-to-dry dressing changes for wound care.  Past Medical History  Diagnosis Date  . Hypertension   . Heart failure   . Asthma   . Diabetes mellitus   . Hyperlipidemia   . Kidney disease     Past Surgical History  Procedure Date  . Ovary surgery     Left  . Hand surgery     right  . Atherectomy and angioplasty 10/18/2011    left posterior tibial artery    History   Social History  . Marital Status: Single    Spouse Name: N/A    Number of Children: Y  . Years of Education: N/A   Occupational History  . not working     prev worked in Software engineer.   Social History Main Topics  . Smoking status: Never Smoker   . Smokeless tobacco: Not on file  . Alcohol Use: Not on file  . Drug Use: Yes     smoked weed from age 79 to 2004.   Marland Kitchen Sexually Active: Not on file   Other Topics Concern  . Not on file   Social History Narrative   Lives with daughter    Family History  Problem Relation  Age of Onset  . Lung cancer Mother   . Cancer Father   . Clotting disorder Mother   . Clotting disorder Sister   . Clotting disorder Brother   . Heart disease Mother   . Heart disease Sister   . Heart disease Brother     Allergies as of 11/07/2011  . (No Known Allergies)    Current Outpatient Prescriptions on File Prior to Visit  Medication Sig Dispense Refill  . amLODipine (NORVASC) 10 MG tablet Take 10 mg by mouth daily.        Marland Kitchen aspirin 81 MG tablet Take 81 mg by mouth daily.        Marland Kitchen atorvastatin (LIPITOR) 40 MG tablet Take 40 mg by mouth daily.        . insulin glargine (LANTUS) 100 UNIT/ML injection as directed.        . isosorbide dinitrate (ISORDIL) 30 MG tablet Take 30 mg by mouth 2 (two) times daily.        . metoprolol (LOPRESSOR) 50 MG tablet Take 50 mg by mouth 2 (two) times daily.        . Vitamin D, Ergocalciferol, (DRISDOL) 50000 UNITS CAPS Take 50,000 Units by mouth once a week.        Marland Kitchen  glipiZIDE (GLUCOTROL) 10 MG tablet Take 10 mg by mouth daily.        . insulin aspart (NOVOLOG) 100 UNIT/ML injection as directed.        Marland Kitchen lisinopril-hydrochlorothiazide (PRINZIDE,ZESTORETIC) 20-25 MG per tablet Take 1 tablet by mouth daily.           REVIEW OF SYSTEMS: No significant extremity pain no chest pain no shortness of breath  PHYSICAL EXAMINATION:   Vital signs are BP 137/76  Pulse 80  Temp(Src) 99 F (37.2 C) (Oral)  Resp 18  Ht 5\' 1"  (1.549 m)  Wt 300 lb (136.079 kg)  BMI 56.68 kg/m2  SpO2 99% General: The patient appears their stated age. Pulmonary:  Non labored breathing Musculoskeletal: There are no major deformities.  There is no significant extremity pain. Neurologic: No focal weakness or paresthesias are detected, Skin: There is a cavity within the anticipation site that I explored I debrided a fair amount of necrotic tissue there is some good healthy granulation tissue at the base of the wound. There is no evidence of infection overall the wound has  improved and appears to be getting smaller Psychiatric: The patient has normal affect.    Diagnostic Studies Duplex ultrasound was performed today which shows ankle-brachial index of 1.0 on the left and 0.87 on the right  Assessment: Left toe amputation and revascularization via atherectomy of the posterior tibial artery Plan: I feel that the patient's wound is improving. Although, this has been a very slow process. I did debride a fair amount of fibrinopurulent in necrotic tissue which was not infected. The wound is without infection. I think that she may benefit from a wound VAC to see if this will help accelerate healing I am going to get that set up today. I'll see her back in one month. She is to see Dr.Nitka in the near future. She'll also be placed on our ultrasound surveillance protocol  V. Leia Alf, M.D. [facility] Office: [phone] Pager:  [phone]

## 2011-11-18 DIAGNOSIS — L98499 Non-pressure chronic ulcer of skin of other sites with unspecified severity: Secondary | ICD-10-CM

## 2011-11-18 DIAGNOSIS — I739 Peripheral vascular disease, unspecified: Secondary | ICD-10-CM

## 2011-12-16 ENCOUNTER — Encounter: Payer: Self-pay | Admitting: Surgery

## 2011-12-19 ENCOUNTER — Encounter: Payer: Self-pay | Admitting: Surgery

## 2011-12-19 ENCOUNTER — Ambulatory Visit (INDEPENDENT_AMBULATORY_CARE_PROVIDER_SITE_OTHER): Payer: PRIVATE HEALTH INSURANCE | Admitting: Surgery

## 2011-12-19 VITALS — BP 173/78 | HR 73 | Resp 16 | Ht 61.0 in | Wt 304.0 lb

## 2011-12-19 DIAGNOSIS — Z5189 Encounter for other specified aftercare: Secondary | ICD-10-CM

## 2011-12-19 NOTE — Progress Notes (Signed)
[facility]   Patient name: Catherine Williams MRN: [id number] DOB: 22-Sep-1961 Sex: female     Chief Complaint  Patient presents with  . Follow-up    one month check , X's two toe Amp. left 4th & 5th    HISTORY OF PRESENT ILLNESS: The patient comes back today for followup she is status post atherectomy and angioplasty of her left posterior tibial artery on October 31. This was done in the setting of a nonhealing wound. She had undergone amputation of her left fourth and fifth toes which had bullous changes and soft tissue infection. This was done in early October by Dr. Louanne Skye. The patient is doing well. I tried to put her in a wound VAC however insurance did not cover this. Therefore, she is doing wet-to-dry dressing changes. She denies fevers or drainage or foul odor.  Past Medical History  Diagnosis Date  . Hypertension   . Heart failure   . Asthma   . Diabetes mellitus   . Hyperlipidemia   . Kidney disease     Past Surgical History  Procedure Date  . Ovary surgery     Left  . Hand surgery     right  . Atherectomy and angioplasty 10/18/2011    left posterior tibial artery  . Toe amputation Sept. 25,2012    Left 4th and 5th toes    History   Social History  . Marital Status: Single    Spouse Name: N/A    Number of Children: Y  . Years of Education: N/A   Occupational History  . not working     prev worked in Software engineer.   Social History Main Topics  . Smoking status: Never Smoker   . Smokeless tobacco: Not on file  . Alcohol Use: Not on file  . Drug Use: Yes     smoked weed from age 11 to 2004.   Marland Kitchen Sexually Active: Not on file   Other Topics Concern  . Not on file   Social History Narrative   Lives with daughter    Family History  Problem Relation Age of Onset  . Lung cancer Mother   . Cancer Father   . Clotting disorder Mother   . Clotting disorder Sister   . Clotting disorder Brother   . Heart disease Mother   . Heart  disease Sister   . Heart disease Brother     Allergies as of 12/19/2011  . (No Known Allergies)    Current Outpatient Prescriptions on File Prior to Visit  Medication Sig Dispense Refill  . amLODipine (NORVASC) 10 MG tablet Take 10 mg by mouth daily.        Marland Kitchen aspirin 81 MG tablet Take 81 mg by mouth daily.        Marland Kitchen atorvastatin (LIPITOR) 40 MG tablet Take 40 mg by mouth daily.        . furosemide (LASIX) 80 MG tablet Take 80 mg by mouth 2 (two) times daily.        Marland Kitchen glipiZIDE (GLUCOTROL) 10 MG tablet Take 10 mg by mouth daily.        . insulin aspart (NOVOLOG) 100 UNIT/ML injection as directed.        . insulin glargine (LANTUS) 100 UNIT/ML injection as directed.        . isosorbide dinitrate (ISORDIL) 30 MG tablet Take 30 mg by mouth 2 (two) times daily.        Marland Kitchen lisinopril-hydrochlorothiazide (PRINZIDE,ZESTORETIC)  20-25 MG per tablet Take 1 tablet by mouth daily.        . metoprolol (LOPRESSOR) 50 MG tablet Take 50 mg by mouth 2 (two) times daily.        . Vitamin D, Ergocalciferol, (DRISDOL) 50000 UNITS CAPS Take 50,000 Units by mouth once a week.           REVIEW OF SYSTEMS: No changes   PHYSICAL EXAMINATION:   Vital signs are BP 173/78  Pulse 73  Resp 16  Ht 5\' 1"  (1.549 m)  Wt 304 lb (137.893 kg)  BMI 57.44 kg/m2  SpO2 99% General: The patient appears their stated age. HEENT:  No gross abnormalities Pulmonary:  Non labored breathing Musculoskeletal: There are no major deformities. Neurologic: No focal weakness or paresthesias are detected, Skin: There is beefy red granulation tissue within her amputation site. There is no evidence of infection. The wound looks very healthy Psychiatric: The patient has normal affect. Cardiovascular: Pedal pulses are not palpable   Diagnostic Studies ABIs were performed today 1.02 on the left with triphasic waveforms. 0.87 with biphasic waveforms on the right  Assessment: Left foot ulcer Plan: The patient's wound is  progressing. I am optimistic that this will heal however I do think it will take some time. She'll be placed on her ultrasound surveillance protocol. I'll see her back in 3 months with a repeat ultrasound  V. Leia Alf, M.D. [facility] Office: [phone] Pager:  [phone]

## 2012-02-01 ENCOUNTER — Ambulatory Visit (INDEPENDENT_AMBULATORY_CARE_PROVIDER_SITE_OTHER): Payer: Self-pay | Admitting: Pulmonary Disease

## 2012-02-01 ENCOUNTER — Encounter: Payer: Self-pay | Admitting: Pulmonary Disease

## 2012-02-01 VITALS — BP 142/90 | HR 88 | Temp 98.3°F | Ht 61.0 in | Wt 317.6 lb

## 2012-02-01 DIAGNOSIS — G4733 Obstructive sleep apnea (adult) (pediatric): Secondary | ICD-10-CM

## 2012-02-01 NOTE — Assessment & Plan Note (Signed)
The patient has been wearing CPAP fairly compliantly, but it is unclear if she is even on an optimal pressure.  We never received her download on the automatic setting from her DME.  It is unclear if her machine is on auto currently, or whether it is been changed back to a fixed pressure.  We need to optimize her pressure for her, then check overnight oximetry on room air on the optimal CPAP setting.  I have also encouraged her to work aggressively on weight loss, since this is the primary treatment for sleep apnea.

## 2012-02-01 NOTE — Patient Instructions (Signed)
Will get download off your machine so that we can find your optimal cpap pressure.  Once this is done, will set your machine on this level.   Once optimal pressure set, will check your oxygen level off oxygen but ON optimal cpap. Work on weight loss followup with me in 82mos, but please call if having any tolerance issues with cpap .

## 2012-02-01 NOTE — Progress Notes (Signed)
  Subjective:    Patient ID: Catherine Williams, female    DOB: [birth year], 51 y.o.   MRN: [id number]  HPI The patient comes in today for followup of her severe obstructive sleep apnea.  She has been wearing CPAP compliantly, but it does not appear that we have ever optimized her pressure.  At the last visit, she was to have a download on the auto set after 2 weeks, but this was never done by the DME.  It is unclear if she is currently on a fixed variable pressure.  The patient is having no issues with her mask fit or leaks.  However, she occasionally has a feeling of claustrophobia as the machine ramps up to higher pressures.  She has gained weight since the last visit.   Review of Systems  Constitutional: Negative.  Negative for fever and unexpected weight change.  HENT: Positive for congestion and sore throat. Negative for ear pain, nosebleeds, rhinorrhea, sneezing, trouble swallowing, dental problem, postnasal drip and sinus pressure.   Eyes: Negative.  Negative for redness and itching.  Respiratory: Positive for cough and shortness of breath. Negative for chest tightness and wheezing.   Cardiovascular: Negative.  Negative for palpitations and leg swelling.  Gastrointestinal: Negative.  Negative for nausea and vomiting.  Genitourinary: Negative.  Negative for dysuria.  Musculoskeletal: Negative.  Negative for joint swelling.  Skin: Negative.  Negative for rash.  Neurological: Negative.  Negative for headaches.  Hematological: Negative.  Does not bruise/bleed easily.  Psychiatric/Behavioral: Negative.  Negative for dysphoric mood. The patient is not nervous/anxious.        Objective:   Physical Exam Morbidly obese female in no acute distress No skin breakdown or pressure necrosis from the CPAP mask Lower extremities with edema noted, no cyanosis Alert, does not appear to be overly sleepy, moves all 4 extremities.      Assessment & Plan:

## 2012-03-15 ENCOUNTER — Encounter (HOSPITAL_COMMUNITY)
Admission: RE | Admit: 2012-03-15 | Discharge: 2012-03-15 | Disposition: A | Discharge status: Home / Self Care | Payer: Self-pay | Source: Ambulatory Visit | Attending: Nephrology | Admitting: Nephrology

## 2012-03-15 DIAGNOSIS — D649 Anemia, unspecified: Secondary | ICD-10-CM | POA: Insufficient documentation

## 2012-03-15 MED ORDER — SODIUM CHLORIDE 0.9 % IV SOLN
INTRAVENOUS | Status: DC
  Administered 2012-03-15: 12:00:00 via INTRAVENOUS

## 2012-03-15 MED ORDER — FERUMOXYTOL INJECTION 510 MG/17 ML
510.0000 mg | INTRAVENOUS | Status: DC
Start: 2012-03-15 — End: 2012-03-16
  Administered 2012-03-15: 510 mg via INTRAVENOUS

## 2012-03-15 MED ORDER — FERUMOXYTOL INJECTION 510 MG/17 ML
INTRAVENOUS | Status: AC
  Administered 2012-03-15: 510 mg via INTRAVENOUS
  Filled 2012-03-15: qty 17

## 2012-03-22 ENCOUNTER — Encounter (HOSPITAL_COMMUNITY): Payer: Self-pay

## 2012-03-27 ENCOUNTER — Encounter (HOSPITAL_COMMUNITY)
Admission: RE | Admit: 2012-03-27 | Discharge: 2012-03-27 | Disposition: A | Discharge status: Home / Self Care | Payer: Medicaid Other | Source: Ambulatory Visit | Attending: Nephrology | Admitting: Nephrology

## 2012-03-27 DIAGNOSIS — D649 Anemia, unspecified: Secondary | ICD-10-CM | POA: Insufficient documentation

## 2012-03-27 MED ORDER — FERUMOXYTOL INJECTION 510 MG/17 ML
510.0000 mg | INTRAVENOUS | Status: AC
  Administered 2012-03-27: 510 mg via INTRAVENOUS
  Filled 2012-03-27: qty 17

## 2012-03-27 MED ORDER — SODIUM CHLORIDE 0.9 % IV SOLN: INTRAVENOUS | Status: DC

## 2012-03-30 ENCOUNTER — Encounter: Payer: Self-pay | Admitting: Surgery

## 2012-04-02 ENCOUNTER — Other Ambulatory Visit: Payer: PRIVATE HEALTH INSURANCE

## 2012-04-02 ENCOUNTER — Ambulatory Visit: Payer: Self-pay | Admitting: Surgery

## 2012-04-02 ENCOUNTER — Ambulatory Visit: Payer: PRIVATE HEALTH INSURANCE | Admitting: Surgery

## 2012-04-19 ENCOUNTER — Telehealth: Payer: Self-pay | Admitting: Pulmonary Disease

## 2012-04-19 NOTE — Telephone Encounter (Signed)
lmomtcb x1 for pt 

## 2012-04-19 NOTE — Telephone Encounter (Signed)
Spoke with pt. She states needs statement from [facility] stating her dx and tx of OSA. She states that this is for her insurance. She would like to pick this up once completed. Please advise, thanks!

## 2012-04-19 NOTE — Telephone Encounter (Signed)
Returning call.Catherine Williams ° °

## 2012-04-20 NOTE — Telephone Encounter (Signed)
Done, given to triage.

## 2012-04-20 NOTE — Telephone Encounter (Signed)
Copy made and placed in KC's scan folder.  Original placed at front for pt to pick up -- pt aware.

## 2012-05-04 ENCOUNTER — Encounter: Payer: Self-pay | Admitting: Surgery

## 2012-05-07 ENCOUNTER — Encounter: Payer: Self-pay | Admitting: Surgery

## 2012-05-07 ENCOUNTER — Encounter (INDEPENDENT_AMBULATORY_CARE_PROVIDER_SITE_OTHER): Payer: Medicaid Other | Admitting: *Deleted

## 2012-05-07 ENCOUNTER — Ambulatory Visit (INDEPENDENT_AMBULATORY_CARE_PROVIDER_SITE_OTHER): Payer: Medicaid Other | Admitting: Surgery

## 2012-05-07 VITALS — BP 161/72 | HR 72 | Temp 98.1°F | Ht 63.0 in | Wt 320.0 lb

## 2012-05-07 DIAGNOSIS — Z5189 Encounter for other specified aftercare: Secondary | ICD-10-CM

## 2012-05-07 DIAGNOSIS — I739 Peripheral vascular disease, unspecified: Secondary | ICD-10-CM

## 2012-05-07 DIAGNOSIS — Z48812 Encounter for surgical aftercare following surgery on the circulatory system: Secondary | ICD-10-CM

## 2012-05-07 NOTE — Progress Notes (Signed)
[facility] [place]   Patient name: Catherine Williams MRN: [id number] DOB: [birth year] Sex: female     Chief Complaint  Patient presents with  . PVD    3 month f/u     HISTORY OF PRESENT ILLNESS: The patient comes back today for followup of her left toe ulcer. On 10/18/2011 she underwent atherectomy of her left posterior tibial artery with angioplasty. This was done in the setting of a nonhealing toe amputation performed by Dr. Louanne Skye.  She has gone onto heal her amputation site.  Past Medical History  Diagnosis Date  . Hypertension   . Heart failure   . Asthma   . Diabetes mellitus   . Hyperlipidemia   . Kidney disease   . Pinched nerve     Past Surgical History  Procedure Date  . Ovary surgery     Left  . Hand surgery     right  . Atherectomy and angioplasty 10/18/2011    left posterior tibial artery  . Toe amputation Sept. 25,2012    Left 4th and 5th toes    History   Social History  . Marital Status: Single    Spouse Name: N/A    Number of Children: Y  . Years of Education: N/A   Occupational History  . not working     prev worked in Software engineer.   Social History Main Topics  . Smoking status: Never Smoker   . Smokeless tobacco: Never Used  . Alcohol Use: No  . Drug Use: No     smoked weed from age 51 to 2004.   Marland Kitchen Sexually Active: Not on file   Other Topics Concern  . Not on file   Social History Narrative   Lives with daughter    Family History  Problem Relation Age of Onset  . Lung cancer Mother   . Cancer Father   . Clotting disorder Mother   . Clotting disorder Sister   . Clotting disorder Brother   . Heart disease Mother   . Heart disease Sister   . Heart disease Brother     Allergies as of 05/07/2012  . (No Known Allergies)    Current Outpatient Prescriptions on File Prior to Visit  Medication Sig Dispense Refill  . amLODipine (NORVASC) 10 MG tablet Take 10 mg by mouth daily.        Marland Kitchen aspirin 81 MG tablet Take  81 mg by mouth daily.        Marland Kitchen atorvastatin (LIPITOR) 40 MG tablet Take 40 mg by mouth daily.        Marland Kitchen gabapentin (NEURONTIN) 100 MG capsule Take 100 mg by mouth at bedtime.      Marland Kitchen HYDROcodone-acetaminophen (NORCO) 10-325 MG per tablet Take 1 tablet by mouth every 6 (six) hours as needed.      . insulin aspart (NOVOLOG) 100 UNIT/ML injection as directed.        . insulin glargine (LANTUS) 100 UNIT/ML injection as directed.        . isosorbide dinitrate (ISORDIL) 30 MG tablet Take 30 mg by mouth 2 (two) times daily.        Marland Kitchen lisinopril-hydrochlorothiazide (PRINZIDE,ZESTORETIC) 20-25 MG per tablet Take 1 tablet by mouth daily.        . metoprolol (LOPRESSOR) 50 MG tablet Take 50 mg by mouth 2 (two) times daily.        . Vitamin D, Ergocalciferol, (DRISDOL) 50000 UNITS CAPS Take 50,000 Units by mouth once  a week.           REVIEW OF SYSTEMS: No changes from prior visit  PHYSICAL EXAMINATION:   Vital signs are BP 161/72  Pulse 72  Temp(Src) 98.1 F (36.7 C) (Oral)  Ht 5\' 3"  (1.6 m)  Wt 320 lb (145.151 kg)  BMI 56.69 kg/m2 General: The patient appears their stated age. HEENT:  No gross abnormalities Pulmonary:  Non labored breathing Musculoskeletal: There are no major deformities. Neurologic: No focal weakness or paresthesias are detected, Skin: The amputation site has healed there is a dry eschar over top of the site. There is no evidence of infection. Psychiatric: The patient has normal affect. Cardiovascular: Edematous lower leg. Pedal pulses are not palpable   Diagnostic Studies Ultrasound was ordered and reviewed today. ABI on the right is 0.8. It is not able to be calculated on the left. This is secondary to noncompressibility. The left toe brachial index is 0.48 which suggests moderately decreased perfusion. Pulsatile PPG waveforms were noted and bilateral great toes. There is no significant change from November of 2012.  Assessment: Peripheral vascular disease with  ulceration Plan: I had requested that the patient get a duplex of her atherectomy site of her posterior tibial artery, however Medicaid did not improve this study. I feel that it is prudent to have duplex evaluation of this area to ensure that it remains patent and does not develop recurrent stenosis, however this was not improved and so I cannot evaluate this at this time. It is my recommendation that she needs a duplex of this area. I discussed this with the patient. I have scheduled her to come back to see me in 6 months with a duplex evaluation of this area to evaluate her for the possibility of recurrent stenosis given her intervention and her tibial artery. I will see her back in 6 months.  Eldridge Abrahams, M.D. [facility] [place] Office: [phone] Pager:  [phone]

## 2012-05-09 NOTE — Progress Notes (Signed)
Addended by: Mena Goes on: 05/09/2012 10:16 AM   Modules accepted: Orders

## 2012-05-21 ENCOUNTER — Other Ambulatory Visit: Payer: Self-pay | Admitting: Specialist

## 2012-05-21 DIAGNOSIS — M79672 Pain in left foot: Secondary | ICD-10-CM

## 2012-05-30 ENCOUNTER — Ambulatory Visit
Admission: RE | Admit: 2012-05-30 | Discharge: 2012-05-30 | Disposition: A | Discharge status: Home / Self Care | Payer: Medicaid Other | Source: Ambulatory Visit | Attending: Specialist | Admitting: Specialist

## 2012-05-30 DIAGNOSIS — M79672 Pain in left foot: Secondary | ICD-10-CM

## 2012-07-31 ENCOUNTER — Ambulatory Visit: Payer: Self-pay | Admitting: Pulmonary Disease

## 2012-08-06 DIAGNOSIS — Z0279 Encounter for issue of other medical certificate: Secondary | ICD-10-CM

## 2012-09-20 ENCOUNTER — Emergency Department (HOSPITAL_COMMUNITY): Payer: Medicaid Other

## 2012-09-20 ENCOUNTER — Emergency Department (HOSPITAL_COMMUNITY)
Admission: EM | Admit: 2012-09-20 | Discharge: 2012-09-20 | Disposition: A | Discharge status: Nursing Home (Includes ICF) | Payer: Medicaid Other | Source: Home / Self Care | Attending: Emergency Medicine | Admitting: Emergency Medicine

## 2012-09-20 ENCOUNTER — Encounter (HOSPITAL_COMMUNITY): Payer: Self-pay | Admitting: Emergency Medicine

## 2012-09-20 ENCOUNTER — Emergency Department (HOSPITAL_COMMUNITY)
Admission: EM | Admit: 2012-09-20 | Discharge: 2012-09-20 | Disposition: A | Discharge status: Home / Self Care | Payer: Medicaid Other | Attending: Emergency Medicine | Admitting: Emergency Medicine

## 2012-09-20 ENCOUNTER — Encounter (HOSPITAL_COMMUNITY): Payer: Self-pay | Admitting: Family Medicine

## 2012-09-20 DIAGNOSIS — R739 Hyperglycemia, unspecified: Secondary | ICD-10-CM

## 2012-09-20 DIAGNOSIS — Z79899 Other long term (current) drug therapy: Secondary | ICD-10-CM | POA: Insufficient documentation

## 2012-09-20 DIAGNOSIS — R079 Chest pain, unspecified: Secondary | ICD-10-CM | POA: Insufficient documentation

## 2012-09-20 DIAGNOSIS — R319 Hematuria, unspecified: Secondary | ICD-10-CM

## 2012-09-20 DIAGNOSIS — N189 Chronic kidney disease, unspecified: Secondary | ICD-10-CM

## 2012-09-20 DIAGNOSIS — N179 Acute kidney failure, unspecified: Secondary | ICD-10-CM

## 2012-09-20 DIAGNOSIS — R0602 Shortness of breath: Secondary | ICD-10-CM | POA: Insufficient documentation

## 2012-09-20 DIAGNOSIS — R110 Nausea: Secondary | ICD-10-CM | POA: Insufficient documentation

## 2012-09-20 DIAGNOSIS — R7309 Other abnormal glucose: Secondary | ICD-10-CM

## 2012-09-20 DIAGNOSIS — I129 Hypertensive chronic kidney disease with stage 1 through stage 4 chronic kidney disease, or unspecified chronic kidney disease: Secondary | ICD-10-CM | POA: Insufficient documentation

## 2012-09-20 DIAGNOSIS — E1169 Type 2 diabetes mellitus with other specified complication: Secondary | ICD-10-CM | POA: Insufficient documentation

## 2012-09-20 DIAGNOSIS — Z794 Long term (current) use of insulin: Secondary | ICD-10-CM | POA: Insufficient documentation

## 2012-09-20 DIAGNOSIS — R1032 Left lower quadrant pain: Secondary | ICD-10-CM | POA: Insufficient documentation

## 2012-09-20 LAB — POCT I-STAT, CHEM 8
Calcium, Ion: 1.35 mmol/L — ABNORMAL HIGH (ref 1.12–1.23)
Glucose, Bld: 490 mg/dL — ABNORMAL HIGH (ref 70–99)
HCT: 42 % (ref 36.0–46.0)
Hemoglobin: 14.3 g/dL (ref 12.0–15.0)
TCO2: 27 mmol/L (ref 0–100)

## 2012-09-20 LAB — CBC WITH DIFFERENTIAL/PLATELET
Basophils Absolute: 0 10*3/uL (ref 0.0–0.1)
Eosinophils Absolute: 0.1 10*3/uL (ref 0.0–0.7)
Eosinophils Relative: 2 % (ref 0–5)
HCT: 36.6 % (ref 36.0–46.0)
Lymphocytes Relative: 31 % (ref 12–46)
MCH: 25.9 pg — ABNORMAL LOW (ref 26.0–34.0)
MCHC: 32 g/dL (ref 30.0–36.0)
MCV: 81.2 fL (ref 78.0–100.0)
Monocytes Absolute: 0.4 10*3/uL (ref 0.1–1.0)
RDW: 13.9 % (ref 11.5–15.5)
WBC: 9.5 10*3/uL (ref 4.0–10.5)

## 2012-09-20 LAB — COMPREHENSIVE METABOLIC PANEL
AST: 10 U/L (ref 0–37)
CO2: 28 mEq/L (ref 19–32)
Calcium: 10.6 mg/dL — ABNORMAL HIGH (ref 8.4–10.5)
Creatinine, Ser: 2.52 mg/dL — ABNORMAL HIGH (ref 0.50–1.10)
GFR calc Af Amer: 24 mL/min — ABNORMAL LOW (ref >90)
GFR calc non Af Amer: 21 mL/min — ABNORMAL LOW (ref >90)
Total Protein: 8 g/dL (ref 6.0–8.3)

## 2012-09-20 LAB — GLUCOSE, CAPILLARY
Glucose-Capillary: 551 mg/dL (ref 70–99)
Glucose-Capillary: 460 mg/dL — ABNORMAL HIGH (ref 70–99)
Glucose-Capillary: 300 mg/dL — ABNORMAL HIGH (ref 70–99)

## 2012-09-20 LAB — URINALYSIS, ROUTINE W REFLEX MICROSCOPIC
Nitrite: NEGATIVE
Specific Gravity, Urine: 1.014 (ref 1.005–1.030)
Urobilinogen, UA: 0.2 mg/dL (ref 0.0–1.0)

## 2012-09-20 LAB — POCT URINALYSIS DIP (DEVICE)
Leukocytes, UA: NEGATIVE
Nitrite: NEGATIVE
Protein, ur: 30 mg/dL — AB
Urobilinogen, UA: 0.2 mg/dL (ref 0.0–1.0)
pH: 5.5 (ref 5.0–8.0)

## 2012-09-20 LAB — URINE MICROSCOPIC-ADD ON

## 2012-09-20 MED ORDER — ONDANSETRON 4 MG PO TBDP
4.0000 mg | ORAL_TABLET | Freq: Once | ORAL | Status: AC
  Administered 2012-09-20: 4 mg via ORAL
  Filled 2012-09-20: qty 1

## 2012-09-20 MED ORDER — INSULIN ASPART 100 UNIT/ML ~~LOC~~ SOLN
6.0000 [IU] | Freq: Once | SUBCUTANEOUS | Status: AC
  Administered 2012-09-20: 6 [IU] via SUBCUTANEOUS

## 2012-09-20 MED ORDER — INSULIN ASPART 100 UNIT/ML ~~LOC~~ SOLN
SUBCUTANEOUS | Status: AC
  Filled 2012-09-20: qty 1

## 2012-09-20 MED ORDER — TRAMADOL-ACETAMINOPHEN 37.5-325 MG PO TABS: 1.0000 | ORAL_TABLET | Freq: Four times a day (QID) | ORAL | Status: DC | PRN

## 2012-09-20 MED ORDER — MORPHINE SULFATE 4 MG/ML IJ SOLN
4.0000 mg | Freq: Once | INTRAMUSCULAR | Status: AC
  Administered 2012-09-20: 4 mg via INTRAVENOUS
  Filled 2012-09-20: qty 1

## 2012-09-20 MED ORDER — INSULIN ASPART 100 UNIT/ML ~~LOC~~ SOLN
15.0000 [IU] | Freq: Once | SUBCUTANEOUS | Status: DC
  Filled 2012-09-20: qty 1

## 2012-09-20 MED ORDER — SODIUM CHLORIDE 0.9 % IV BOLUS (SEPSIS)
1000.0000 mL | Freq: Once | INTRAVENOUS | Status: AC
  Administered 2012-09-20: 1000 mL via INTRAVENOUS

## 2012-09-20 NOTE — ED Notes (Signed)
CBG performed; 551 mg/dL CMA & physician notified.

## 2012-09-20 NOTE — ED Provider Notes (Signed)
History     CSN: [id number]  Arrival date & time 09/20/12  1337   First MD Initiated Contact with Patient 09/20/12 1428      Chief Complaint  Patient presents with  . Hyperglycemia    (Consider location/radiation/quality/duration/timing/severity/associated sxs/prior treatment) HPI  51 y.o. femlae INAD c/o hyperglycemia >500 associated with improving blurred vision to both eyes.  Patient also reports a dry cough, and and intermittent LLQ pain with nausea.  Missed single dose of her nightly insulin on monday (x4 days). LLQ pain is 5/10 excaerbated by movemnet and cough. Described as feeling "like something is moving." Denies diarrrhea, fever, vomitting, chest pain, palpations, SOB.   Past Medical History  Diagnosis Date  . Hypertension   . Heart failure   . Asthma   . Diabetes mellitus   . Hyperlipidemia   . Kidney disease   . Pinched nerve     Past Surgical History  Procedure Date  . Ovary surgery     Left  . Hand surgery     right  . Atherectomy and angioplasty 10/18/2011    left posterior tibial artery  . 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 Lung cancer Mother   . Cancer Father   . Clotting disorder Mother   . Clotting disorder Sister   . Clotting disorder Brother   . Heart disease Mother   . Heart disease Sister   . Heart disease Brother     History  Substance Use Topics  . Smoking status: Never Smoker   . Smokeless tobacco: Never Used  . Alcohol Use: No    OB History    Grav Para Term Preterm Abortions TAB SAB Ect Mult Living                  Review of Systems  Constitutional: Negative for fever.  Respiratory: Positive for cough. Negative for shortness of breath.   Cardiovascular: Negative for chest pain and palpitations.  Gastrointestinal: Positive for abdominal pain. Negative for nausea, vomiting and diarrhea.  Skin: Negative for rash.  All other systems reviewed and are  negative.    Allergies  Review of patient's allergies indicates no known allergies.  Home Medications   Current Outpatient Rx  Name Route Sig Dispense Refill  . AMLODIPINE BESYLATE 10 MG PO TABS Oral Take 10 mg by mouth daily.      . ASPIRIN 81 MG PO TABS Oral Take 81 mg by mouth daily.      Marland Kitchen GABAPENTIN 100 MG PO CAPS Oral Take 100 mg by mouth at bedtime.    Marland Kitchen HYDROCODONE-ACETAMINOPHEN 10-325 MG PO TABS Oral Take 1 tablet by mouth every 6 (six) hours as needed.    . INSULIN ASPART 100 UNIT/ML [place] SOLN Subcutaneous Inject 6-10 Units into the skin as directed. Sliding Scale per MD    . INSULIN GLARGINE 100 UNIT/ML [place] SOLN Subcutaneous Inject 55 Units into the skin at bedtime.     . ISOSORBIDE DINITRATE 30 MG PO TABS Oral Take 30 mg by mouth 2 (two) times daily.      Marland Kitchen METOPROLOL TARTRATE 50 MG PO TABS Oral Take 50 mg by mouth 2 (two) times daily.      . MULTI-VITAMIN/MINERALS PO TABS Oral Take 1 tablet by mouth daily.    . TELMISARTAN 80 MG PO TABS Oral Take 80 mg by mouth daily.  BP 156/77  Pulse 75  Temp 97.8 F (36.6 C) (Oral)  Resp 18  SpO2 97%  LMP 09/05/2012  Physical Exam  Nursing note and vitals reviewed. Constitutional: She is oriented to person, place, and time. She appears well-developed and well-nourished. No distress.       Morbidly obese.  HENT:  Head: Normocephalic.  Eyes: Conjunctivae normal and EOM are normal. Pupils are equal, round, and reactive to light.  Neck: Normal range of motion.  Cardiovascular: Normal rate, regular rhythm and intact distal pulses.   Pulmonary/Chest: Effort normal and breath sounds normal. No stridor. No respiratory distress. She has no wheezes. She has no rales. She exhibits no tenderness.  Abdominal: Soft. Bowel sounds are normal. She exhibits no distension and no mass. There is no tenderness. There is no rebound and no guarding.  Musculoskeletal: Normal range of motion.  Neurological: She is alert and oriented to person,  place, and time.  Psychiatric: She has a normal mood and affect.    ED Course  Procedures (including critical care time)  Labs Reviewed  GLUCOSE, CAPILLARY - Abnormal; Notable for the following:    Glucose-Capillary 460 (*)     All other components within normal limits  CBC WITH DIFFERENTIAL - Abnormal; Notable for the following:    Hemoglobin 11.7 (*)     MCH 25.9 (*)     All other components within normal limits  COMPREHENSIVE METABOLIC PANEL - Abnormal; Notable for the following:    Sodium 133 (*)     Chloride 94 (*)     Glucose, Bld 435 (*)     BUN 46 (*)     Creatinine, Ser 2.52 (*)     Calcium 10.6 (*)     Albumin 3.3 (*)     Alkaline Phosphatase 130 (*)     Total Bilirubin 0.2 (*)     GFR calc non Af Amer 21 (*)     GFR calc Af Amer 24 (*)     All other components within normal limits  URINALYSIS, ROUTINE W REFLEX MICROSCOPIC - Abnormal; Notable for the following:    APPearance HAZY (*)     Glucose, UA >1000 (*)     Hgb urine dipstick MODERATE (*)     All other components within normal limits  URINE MICROSCOPIC-ADD ON - Abnormal; Notable for the following:    Squamous Epithelial / LPF FEW (*)     Bacteria, UA FEW (*)     All other components within normal limits  GLUCOSE, CAPILLARY - Abnormal; Notable for the following:    Glucose-Capillary 300 (*)     All other components within normal limits   Dg Chest 2 View  09/20/2012  *RADIOLOGY REPORT*  Clinical Data: Chest pain, shortness of breath.  CHEST - 2 VIEW  Comparison: September 24, 2011.  Findings: Cardiomediastinal silhouette appears normal.  No acute pulmonary disease is noted.  Bony thorax is intact.  IMPRESSION: No acute cardiopulmonary abnormality seen.   Original Report Authenticated By: Dalene Carrow., M.D.     Date: 09/20/2012  Rate: 82  Rhythm: normal sinus rhythm  QRS Axis: left  Intervals: normal  ST/T Wave abnormalities: normal  Conduction Disutrbances:left anterior fascicular block  Narrative  Interpretation:   Old EKG Reviewed: unchanged   1. Hyperglycemia   2. Chronic renal failure   3. Hematuria       MDM  Patient has hyperglycemia to 435 anion gap is normal at 11. Her creatinine is elevated  to 2.5, which isn't remarkable from her prior readings, which are usually over 2. Blood work is otherwise unremarkable. Chest x-ray is normal. EKG pending.  EKG shows no ischemic changes. After liter bolus. Patient's blood glucose is 300. Patient is stable for discharge. She has all of her home medications including insulin at this point. [facility] was [facility] in and does have primary care appointment scheduled out for medical group on October 21. I have advised her to check her blood sugars at least daily and to increase her water intake.  Moderate amount of blood in the urine. Patient is not menstruating. She is perimenopausal. I've advised her to inform her primary care Dr. that she had blood in the urine and they will likely perform a recheck at a later date.   Pt verbalized understanding and agrees with care plan. Outpatient follow-up and return precautions given.    New Prescriptions   TRAMADOL-ACETAMINOPHEN (ULTRACET) 37.5-325 MG PER TABLET    Take 1 tablet by mouth every 6 (six) hours as needed for pain.        Monico Blitz, PA-C 09/20/12 1743

## 2012-09-20 NOTE — ED Notes (Signed)
CBG 300. 

## 2012-09-20 NOTE — ED Provider Notes (Signed)
History     CSN: [id number]  Arrival date & time 09/20/12  1113   First MD Initiated Contact with Patient 09/20/12 1156      Chief Complaint  Patient presents with  . Hyperglycemia    (Consider location/radiation/quality/duration/timing/severity/associated sxs/prior treatment) HPI Pt reports she is a [facility] so has not seen a physician in some time.  She checks her blood sugar about once per week.  She last checked it 4 days ago in the morning and it was 161.  She reports that, two days ago, she started feeling under the weather.  She was having occasional nausea, some blurred vision, and headache.  She denies any vomiting, decreased appetite, rhinorrhea, fevers/chills, dysuria.  She has had a bit of a non-productive cough but no shortness of breath.  She continues to go to her exercise classes.  Past Medical History  Diagnosis Date  . Hypertension   . Heart failure   . Asthma   . Diabetes mellitus   . Hyperlipidemia   . Kidney disease   . Pinched nerve     Past Surgical History  Procedure Date  . Ovary surgery     Left  . Hand surgery     right  . Atherectomy and angioplasty 10/18/2011    left posterior tibial artery  . 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 Lung cancer Mother   . Cancer Father   . Clotting disorder Mother   . Clotting disorder Sister   . Clotting disorder Brother   . Heart disease Mother   . Heart disease Sister   . Heart disease Brother     History  Substance Use Topics  . Smoking status: Never Smoker   . Smokeless tobacco: Never Used  . Alcohol Use: No    OB History    Grav Para Term Preterm Abortions TAB SAB Ect Mult Living                  Review of Systems  Constitutional: Negative for fever, chills, activity change and appetite change.  HENT: Negative for congestion and rhinorrhea.   Respiratory: Positive for cough. Negative for chest  tightness and shortness of breath.   Cardiovascular: Negative for chest pain.  Genitourinary: Negative for dysuria and difficulty urinating.  Musculoskeletal: Negative for arthralgias.  Neurological: Positive for headaches. Negative for dizziness and weakness.    Allergies  Review of patient's allergies indicates no known allergies.  Home Medications   Current Outpatient Rx  Name Route Sig Dispense Refill  . AMLODIPINE BESYLATE 10 MG PO TABS Oral Take 10 mg by mouth daily.      . ASPIRIN 81 MG PO TABS Oral Take 81 mg by mouth daily.      . ATORVASTATIN CALCIUM 40 MG PO TABS Oral Take 40 mg by mouth daily.      . INSULIN ASPART 100 UNIT/ML [place] SOLN  as directed.      . INSULIN GLARGINE 100 UNIT/ML [place] SOLN  as directed.      . ISOSORBIDE DINITRATE 30 MG PO TABS Oral Take 30 mg by mouth 2 (two) times daily.      Marland Kitchen LISINOPRIL-HYDROCHLOROTHIAZIDE 20-25 MG PO TABS Oral Take 1 tablet by mouth daily.      Marland Kitchen METOPROLOL TARTRATE 50 MG PO TABS Oral Take 50 mg by mouth 2 (two) times daily.      Marland Kitchen  GABAPENTIN 100 MG PO CAPS Oral Take 100 mg by mouth at bedtime.    Marland Kitchen HYDROCODONE-ACETAMINOPHEN 10-325 MG PO TABS Oral Take 1 tablet by mouth every 6 (six) hours as needed.    Marland Kitchen VITAMIN D (ERGOCALCIFEROL) 50000 UNITS PO CAPS Oral Take 50,000 Units by mouth once a week.        BP 133/59  Pulse 76  Temp 98.9 F (37.2 C) (Oral)  Resp 19  SpO2 100%  LMP 09/05/2012  Physical Exam  Constitutional: She is oriented to person, place, and time. She appears well-developed and well-nourished.  HENT:  Head: Normocephalic and atraumatic.  Neck: Normal range of motion. Neck supple.  Cardiovascular: Normal rate, regular rhythm and normal heart sounds.   Pulmonary/Chest: Effort normal and breath sounds normal.  Abdominal: Soft. She exhibits no distension.  Lymphadenopathy:    She has no cervical adenopathy.  Neurological: She is alert and oriented to person, place, and time.  Skin: Skin is warm and dry.   Psychiatric: She has a normal mood and affect. Her behavior is normal. Thought content normal.    ED Course  Procedures (including critical care time)  Labs Reviewed  GLUCOSE, CAPILLARY - Abnormal; Notable for the following:    Glucose-Capillary 551 (*)     All other components within normal limits  POCT URINALYSIS DIP (DEVICE) - Abnormal; Notable for the following:    Glucose, UA >=1000 (*)     Hgb urine dipstick SMALL (*)     Protein, ur 30 (*)     All other components within normal limits  POCT I-STAT, CHEM 8 - Abnormal; Notable for the following:    Sodium 134 (*)     BUN 47 (*)     Creatinine, Ser 2.40 (*)     Glucose, Bld 490 (*)     Calcium, Ion 1.35 (*)     All other components within normal limits   No results found.   1. Hyperglycemia   2. Acute kidney failure       MDM  Patient with hyperglycemia and acute renal failure.  Is dehydrated and needs insulin therapy.  No evidence of ketosis at this time.  Will transfer to ED for further care (IV fluids, further insulin) and will give initial dose of insulin here prior to transfer.  6 units SQ novolog given.        Vivi Ferns, MD 09/20/12 [phone]

## 2012-09-20 NOTE — ED Notes (Signed)
Pt sent here for further evaluation of hyperglycemia and dehydration. sts hasnt been feeling well. sts she has been taking her insulin but not taking her CBGs regularly.

## 2012-09-20 NOTE — ED Provider Notes (Signed)
Medical screening examination/treatment/procedure(s) were performed by non-physician practitioner and as supervising physician I was immediately available for consultation/collaboration.  Burnett Kanaris, MD 09/20/12 1312

## 2012-09-20 NOTE — Discharge Instructions (Signed)
Go to the ER.  You have high blood sugar and it is causing your kidneys to have problems.  You are dehydrated and needs lots of fluids. It is very important that you be checking your blood sugar every day, preferably several times per day, so that you can catch something like this before you end up needing to go the the ER. It is very important that you go to see your new doctor.  Hyperglycemia Hyperglycemia occurs when the glucose (sugar) in your blood is too high. Hyperglycemia can happen for many reasons, but it most often happens to people who do not know they have diabetes or are not managing their diabetes properly.  CAUSES  Whether you have diabetes or not, there are other causes of hyperglycemia. Hyperglycemia can occur when you have diabetes, but it can also occur in other situations that you might not be as aware of, such as: Diabetes  If you have diabetes and are having problems controlling your blood glucose, hyperglycemia could occur because of some of the following reasons:  Not following your meal plan.  Not taking your diabetes medications or not taking it properly.  Exercising less or doing less activity than you normally do.  Being sick. Pre-diabetes  This cannot be ignored. Before people develop Type 2 diabetes, they almost always have "pre-diabetes." This is when your blood glucose levels are higher than normal, but not yet high enough to be diagnosed as diabetes. Research has shown that some long-term damage to the body, especially the heart and circulatory system, may already be occurring during pre-diabetes. If you take action to manage your blood glucose when you have pre-diabetes, you may delay or prevent Type 2 diabetes from developing. Stress  If you have diabetes, you may be "diet" controlled or on oral medications or insulin to control your diabetes. However, you may find that your blood glucose is higher than usual in the hospital whether you have diabetes or  not. This is often referred to as "stress hyperglycemia." Stress can elevate your blood glucose. This happens because of hormones put out by the body during times of stress. If stress has been the cause of your high blood glucose, it can be followed regularly by your caregiver. That way he/she can make sure your hyperglycemia does not continue to get worse or progress to diabetes. Steroids  Steroids are medications that act on the infection fighting system (immune system) to block inflammation or infection. One side effect can be a rise in blood glucose. Most people can produce enough extra insulin to allow for this rise, but for those who cannot, steroids make blood glucose levels go even higher. It is not unusual for steroid treatments to "uncover" diabetes that is developing. It is not always possible to determine if the hyperglycemia will go away after the steroids are stopped. A special blood test called an A1c is sometimes done to determine if your blood glucose was elevated before the steroids were started. SYMPTOMS  Thirsty.  Frequent urination.  Dry mouth.  Blurred vision.  Tired or fatigue.  Weakness.  Sleepy.  Tingling in feet or leg. DIAGNOSIS  Diagnosis is made by monitoring blood glucose in one or all of the following ways:  A1c test. This is a chemical found in your blood.  Fingerstick blood glucose monitoring.  Laboratory results. TREATMENT  First, knowing the cause of the hyperglycemia is important before the hyperglycemia can be treated. Treatment may include, but is not be limited to:  Education.  Change or adjustment in medications.  Change or adjustment in meal plan.  Treatment for an illness, infection, etc.  More frequent blood glucose monitoring.  Change in exercise plan.  Decreasing or stopping steroids.  Lifestyle changes. HOME CARE INSTRUCTIONS   Test your blood glucose as directed.  Exercise regularly. Your caregiver will give you  instructions about exercise. Pre-diabetes or diabetes which comes on with stress is helped by exercising.  Eat wholesome, balanced meals. Eat often and at regular, fixed times. Your caregiver or nutritionist will give you a meal plan to guide your sugar intake.  Being at an ideal weight is important. If needed, losing as little as 10 to 15 pounds may help improve blood glucose levels. SEEK MEDICAL CARE IF:   You have questions about medicine, activity, or diet.  You continue to have symptoms (problems such as increased thirst, urination, or weight gain). SEEK IMMEDIATE MEDICAL CARE IF:   You are vomiting or have diarrhea.  Your breath smells fruity.  You are breathing faster or slower.  You are very sleepy or incoherent.  You have numbness, tingling, or pain in your feet or hands.  You have chest pain.  Your symptoms get worse even though you have been following your caregiver's orders.  If you have any other questions or concerns. Document Released: 05/31/2001 Document Revised: 02/27/2012 Document Reviewed: 04/02/2012 [facility] Patient Information 2013 [place].

## 2012-09-20 NOTE — ED Notes (Signed)
Pt blood sugar is 551. Pt states that she is feeling nauseas and having a headache with spotty vision. Pt has had symptoms since Tuesday. Pt denies any other symtoms.   Hx diabetes/high blood pressure/asthma

## 2012-09-24 NOTE — ED Provider Notes (Signed)
Medical screening examination/treatment/procedure(s) were performed by non-physician practitioner and as supervising physician I was immediately available for consultation/collaboration.  Virgel Manifold, MD 09/24/12 [phone]

## 2012-11-07 ENCOUNTER — Ambulatory Visit: Payer: Medicaid Other | Admitting: Dietician

## 2012-11-08 ENCOUNTER — Encounter: Payer: Medicaid Other | Admitting: Dietician

## 2012-11-08 ENCOUNTER — Encounter: Payer: Medicaid Other | Attending: Internal Medicine | Admitting: *Deleted

## 2012-11-08 ENCOUNTER — Encounter: Payer: Self-pay | Admitting: *Deleted

## 2012-11-08 VITALS — Ht 63.0 in | Wt 340.0 lb

## 2012-11-08 DIAGNOSIS — Z713 Dietary counseling and surveillance: Secondary | ICD-10-CM | POA: Insufficient documentation

## 2012-11-08 DIAGNOSIS — E119 Type 2 diabetes mellitus without complications: Secondary | ICD-10-CM | POA: Insufficient documentation

## 2012-11-08 NOTE — Patient Instructions (Signed)
Goals:  Follow Diabetes Meal Plan as instructed  Eat 3 meals and 2 snacks, every 3-5 hrs  Limit carbohydrate intake to 45-60 grams carbohydrate/meal  Limit carbohydrate intake to 15-30 grams carbohydrate/snack  Add lean protein foods to meals/snacks  Monitor glucose levels as instructed by your doctor  Aim for 30 mins of physical activity daily  Bring food record and glucose log to your next nutrition visit 

## 2012-11-08 NOTE — Progress Notes (Signed)
HbA1c 13%  Patient was seen on 11/08/12 for the first of a series of three diabetes self-management courses at [facility]. The following learning objectives were met by the patient during this course:   Defines the role of glucose and insulin  Identifies type of diabetes and pathophysiology  Defines the diagnostic criteria for diabetes and prediabetes  States the risk factors for Type 2 Diabetes  States the symptoms of Type 2 Diabetes  Defines Type 2 Diabetes treatment goals  Defines Type 2 Diabetes treatment options  States the rationale for glucose monitoring  Identifies A1C, glucose targets, and testing times  Identifies proper sharps disposal  Defines the purpose of a diabetes food plan  Identifies carbohydrate food groups  Defines effects of carbohydrate foods on glucose levels  Identifies carbohydrate choices/grams/food labels  States benefits of physical activity and effect on glucose  Review of suggested activity guidelines  Handouts given during class include:  Type 2 Diabetes: Basics Book  My [place] and Activity Log   Follow-Up Plan: Attend Core 2 and 3 classes

## 2012-11-09 ENCOUNTER — Encounter: Payer: Self-pay | Admitting: Neurosurgery

## 2012-11-12 ENCOUNTER — Encounter: Payer: Self-pay | Admitting: Neurosurgery

## 2012-11-12 ENCOUNTER — Ambulatory Visit (INDEPENDENT_AMBULATORY_CARE_PROVIDER_SITE_OTHER): Payer: Medicaid Other | Admitting: Neurosurgery

## 2012-11-12 ENCOUNTER — Ambulatory Visit (INDEPENDENT_AMBULATORY_CARE_PROVIDER_SITE_OTHER): Payer: Medicaid Other | Admitting: Vascular Surgery

## 2012-11-12 VITALS — BP 154/85 | HR 78 | Resp 18 | Ht 63.0 in | Wt 343.0 lb

## 2012-11-12 DIAGNOSIS — I739 Peripheral vascular disease, unspecified: Secondary | ICD-10-CM

## 2012-11-12 DIAGNOSIS — Z48812 Encounter for surgical aftercare following surgery on the circulatory system: Secondary | ICD-10-CM

## 2012-11-12 NOTE — Progress Notes (Signed)
Left lower extremity arterial duplex performed @ [facility] 11/12/2012

## 2012-11-12 NOTE — Progress Notes (Signed)
[facility] Office Note  CC: [facility]  surveillance Referring Physician: Brabham  History of Present Illness: 51 year old female patient of Dr. Trula Slade who is status post arthrectomy of her left posterior tibial artery with angioplasty in October 2012. The patient denies claudication or rest pain. The patient has no open ulceration on her lower extremities and the toe amputation performed by Dr. Louanne Skye has completely healed.  Past Medical History  Diagnosis Date  . Hypertension   . Heart failure   . Asthma   . Diabetes mellitus   . Hyperlipidemia   . Kidney disease   . Pinched nerve   . Morbid obesity   . COPD (chronic obstructive pulmonary disease)   . Sleep apnea   . Peripheral vascular disease     ROS: [x]  Positive   [ ]  Denies    General: [ ]  Weight loss, [ ]  Fever, [ ]  chills Neurologic: [ ]  Dizziness, [ ]  Blackouts, [ ]  Seizure [ ]  Stroke, [ ]  "Mini stroke", [ ]  Slurred speech, [ ]  Temporary blindness; [ ]  weakness in arms or legs, [ ]  Hoarseness Cardiac: [ ]  Chest pain/pressure, [ ]  Shortness of breath at rest [ ]  Shortness of breath with exertion, [ ]  Atrial fibrillation or irregular heartbeat Vascular: [ ]  Pain in legs with walking, [ ]  Pain in legs at rest, [ ]  Pain in legs at night,  [ ]  Non-healing ulcer, [ ]  Blood clot in vein/DVT,   Pulmonary: [ ]  Home oxygen, [ ]  Productive cough, [ ]  Coughing up blood, [ ]  Asthma,  [ ]  Wheezing Musculoskeletal:  [ ]  Arthritis, [ ]  Low back pain, [ ]  Joint pain Hematologic: [ ]  Easy Bruising, [ ]  Anemia; [ ]  Hepatitis Gastrointestinal: [ ]  Blood in stool, [ ]  Gastroesophageal Reflux/heartburn, [ ]  Trouble swallowing Urinary: [ ]  chronic Kidney disease, [ ]  on HD - [ ]  MWF or [ ]  TTHS, [ ]  Burning with urination, [ ]  Difficulty urinating Skin: [ ]  Rashes, [ ]  Wounds Psychological: [ ]  Anxiety, [ ]  Depression   Social History History  Substance Use Topics  . Smoking status: Never Smoker   .  Smokeless tobacco: Never Used  . Alcohol Use: No    Family History Family History  Problem Relation Age of Onset  . Lung cancer Mother   . Cancer Father   . Clotting disorder Mother   . Clotting disorder Sister   . Clotting disorder Brother   . Heart disease Mother   . Heart disease Sister   . Heart disease Brother     No Known Allergies  Current Outpatient Prescriptions  Medication Sig Dispense Refill  . amLODipine (NORVASC) 10 MG tablet Take 10 mg by mouth daily.        Marland Kitchen aspirin 81 MG tablet Take 81 mg by mouth daily.        Marland Kitchen atorvastatin (LIPITOR) 40 MG tablet Take 40 mg by mouth daily.      . furosemide (LASIX) 80 MG tablet Take 80 mg by mouth 2 (two) times daily.      Marland Kitchen gabapentin (NEURONTIN) 100 MG capsule Take 100 mg by mouth at bedtime.      . hydrALAZINE (APRESOLINE) 25 MG tablet Take 25 mg by mouth.      Marland Kitchen HYDROcodone-acetaminophen (NORCO) 10-325 MG per tablet Take 1 tablet by mouth every 6 (six) hours as needed.      . insulin aspart (NOVOLOG) 100 UNIT/ML injection Inject  6-10 Units into the skin as directed. Sliding Scale per MD      . insulin glargine (LANTUS) 100 UNIT/ML injection Inject 55 Units into the skin at bedtime.       . isosorbide dinitrate (ISORDIL) 30 MG tablet Take 30 mg by mouth 2 (two) times daily.        . metoprolol (LOPRESSOR) 50 MG tablet Take 50 mg by mouth 2 (two) times daily.        . Multiple Vitamins-Minerals (MULTIVITAMIN WITH MINERALS) tablet Take 1 tablet by mouth daily.      Marland Kitchen telmisartan (MICARDIS) 80 MG tablet Take 80 mg by mouth daily.      . traMADol-acetaminophen (ULTRACET) 37.5-325 MG per tablet Take 1 tablet by mouth every 6 (six) hours as needed for pain.  30 tablet  0    Physical Examination  Filed Vitals:   11/12/12 1402  BP: 154/85  Pulse: 78  Resp: 18    Body mass index is 60.76 kg/(m^2).  General:  WDWN in NAD Gait: Normal HEENT: WNL Eyes: Pupils equal Pulmonary: normal non-labored breathing , without Rales,  rhonchi,  wheezing Cardiac: RRR, without  Murmurs, rubs or gallops; No carotid bruits Abdomen: soft, NT, no masses Skin: no rashes, ulcers noted Vascular Exam/Pulses: Palpable femoral pulses bilaterally, the patient does have a palpable PT and DP pulse on the left  Extremities without ischemic changes, no Gangrene , no cellulitis; no open wounds;  Musculoskeletal: no muscle wasting or atrophy  Neurologic: A&O X 3; Appropriate Affect ; SENSATION: normal; MOTOR FUNCTION:  moving all extremities equally. Speech is fluent/normal  Non-Invasive Vascular Imaging: Lower extremity duplex today does show a patent vessel with no elevation in velocity greater than 188.  ASSESSMENT/PLAN: Asymptomatic patient doing well with no claudication, the patient will followup in 6 months with repeat duplex as well as ABIs since her ABIs were not approved today. The patient is in agreement with this, her questions were encouraged and answered.  Beatris Ship ANP  Clinic M.D.: Trula Slade

## 2012-11-14 NOTE — Addendum Note (Signed)
Addended by: Mena Goes on: 11/14/2012 01:45 PM   Modules accepted: Orders

## 2012-11-18 NOTE — Progress Notes (Signed)
  Medical Nutrition Therapy:  Appt start time: 1200 end time:  1230.   Assessment:  Primary concerns today: Referral for day for insulin instructions.  Has currently been taking insulin using an insulin pen for sometime.  She reports that her MD has told her she [place] have to go to insulin syringes for insurance purposes.  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  We discussed her current situation, plotted her insulin injecting times and her insulin peaks and periods when insulin is lowest in her body.  She saw that with her current times of injecting, there are meal times when she is not covered with insulin for the meal.  She is to talk at length with her MD regarding the insulin changes.  Provided her with my card, and if needed, she will get back with me regarding any assistance I Jaquis Picklesimer be able to provide.

## 2012-11-22 ENCOUNTER — Other Ambulatory Visit: Payer: Self-pay | Admitting: Obstetrics & Gynecology

## 2012-11-22 DIAGNOSIS — Z1231 Encounter for screening mammogram for malignant neoplasm of breast: Secondary | ICD-10-CM

## 2012-11-22 DIAGNOSIS — R1031 Right lower quadrant pain: Secondary | ICD-10-CM

## 2012-11-29 ENCOUNTER — Encounter: Payer: Medicaid Other | Admitting: Dietician

## 2012-12-07 ENCOUNTER — Encounter (HOSPITAL_COMMUNITY): Payer: Self-pay | Admitting: Emergency Medicine

## 2012-12-07 ENCOUNTER — Emergency Department (HOSPITAL_COMMUNITY)
Admission: EM | Admit: 2012-12-07 | Discharge: 2012-12-07 | Disposition: A | Discharge status: Home / Self Care | Payer: Medicaid Other | Source: Home / Self Care | Attending: Family Medicine | Admitting: Family Medicine

## 2012-12-07 DIAGNOSIS — M5030 Other cervical disc degeneration, unspecified cervical region: Secondary | ICD-10-CM

## 2012-12-07 MED ORDER — DICLOFENAC POTASSIUM 50 MG PO TABS: 50.0000 mg | ORAL_TABLET | Freq: Three times a day (TID) | ORAL | Status: DC

## 2012-12-07 NOTE — ED Notes (Signed)
C/o neck pain and headache which started 45 minutes ago.  No therapy done.

## 2012-12-07 NOTE — ED Provider Notes (Signed)
History     CSN: [id number]  Arrival date & time 12/07/12  1628   First MD Initiated Contact with Patient 12/07/12 1724      Chief Complaint  Patient presents with  . Neck Pain    (Consider location/radiation/quality/duration/timing/severity/associated sxs/prior treatment) Patient is a 51 y.o. female presenting with neck pain. The history is provided by the patient.  Neck Pain  This is a new problem. The current episode started more than 1 week ago (sx off and on for 2 mos.). The problem has not changed since onset.The pain is associated with nothing. The pain is present in the generalized neck. The pain does not radiate. The pain is mild. Worse during: sporadic spasms assoc with pains. Pertinent negatives include no chest pain, no syncope, no numbness, no headaches, no leg pain, no paresis, no tingling and no weakness.    Past Medical History  Diagnosis Date  . Hypertension   . Heart failure   . Asthma   . Diabetes mellitus   . Hyperlipidemia   . Kidney disease   . Pinched nerve   . Morbid obesity   . COPD (chronic obstructive pulmonary disease)   . Sleep apnea   . Peripheral vascular disease     Past Surgical History  Procedure Date  . Ovary surgery     Left  . Hand surgery     right  . Atherectomy and angioplasty 10/18/2011    left posterior tibial artery  . 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 Lung cancer Mother   . Cancer Father   . Clotting disorder Mother   . Clotting disorder Sister   . Clotting disorder Brother   . Heart disease Mother   . Heart disease Sister   . Heart disease Brother     History  Substance Use Topics  . Smoking status: Never Smoker   . Smokeless tobacco: Never Used  . Alcohol Use: No    OB History    Grav Para Term Preterm Abortions TAB SAB Ect Mult Living                  Review of Systems  Constitutional: Negative.   HENT: Positive for neck  pain. Negative for ear pain, neck stiffness and ear discharge.   Respiratory: Negative for shortness of breath.   Cardiovascular: Negative for chest pain and syncope.  Musculoskeletal: Negative for gait problem.  Neurological: Negative for tingling, weakness, numbness and headaches.    Allergies  Review of patient's allergies indicates no known allergies.  Home Medications   Current Outpatient Rx  Name  Route  Sig  Dispense  Refill  . AMLODIPINE BESYLATE 10 MG PO TABS   Oral   Take 10 mg by mouth daily.           . ASPIRIN 81 MG PO TABS   Oral   Take 81 mg by mouth daily.           . ATORVASTATIN CALCIUM 40 MG PO TABS   Oral   Take 40 mg by mouth daily.         Marland Kitchen DICLOFENAC POTASSIUM 50 MG PO TABS   Oral   Take 1 tablet (50 mg total) by mouth 3 (three) times daily. As needed for neck pains   21 tablet   0   . FUROSEMIDE 80 MG PO TABS   Oral  Take 80 mg by mouth 2 (two) times daily.         Marland Kitchen GABAPENTIN 100 MG PO CAPS   Oral   Take 100 mg by mouth at bedtime.         Marland Kitchen HYDRALAZINE HCL 25 MG PO TABS   Oral   Take 25 mg by mouth.         Marland Kitchen HYDROCODONE-ACETAMINOPHEN 10-325 MG PO TABS   Oral   Take 1 tablet by mouth every 6 (six) hours as needed.         . INSULIN ASPART 100 UNIT/ML [place] SOLN   Subcutaneous   Inject 6-10 Units into the skin as directed. Sliding Scale per MD         . INSULIN GLARGINE 100 UNIT/ML [place] SOLN   Subcutaneous   Inject 55 Units into the skin at bedtime.          . ISOSORBIDE DINITRATE 30 MG PO TABS   Oral   Take 30 mg by mouth 2 (two) times daily.           Marland Kitchen METOPROLOL TARTRATE 50 MG PO TABS   Oral   Take 50 mg by mouth 2 (two) times daily.           . MULTI-VITAMIN/MINERALS PO TABS   Oral   Take 1 tablet by mouth daily.         . TELMISARTAN 80 MG PO TABS   Oral   Take 80 mg by mouth daily.         . TRAMADOL-ACETAMINOPHEN 37.5-325 MG PO TABS   Oral   Take 1 tablet by mouth every 6 (six) hours as  needed for pain.   30 tablet   0     BP 171/65  Pulse 73  Temp 99.4 F (37.4 C) (Oral)  Resp 20  SpO2 93%  Physical Exam  Nursing note and vitals reviewed. Constitutional: She is oriented to person, place, and time. She appears well-developed and well-nourished.  HENT:  Head: Normocephalic.  Ears:  Mouth/Throat: Oropharynx is clear and moist.  Eyes: Conjunctivae normal are normal. Pupils are equal, round, and reactive to light.  Neck: Trachea normal. Muscular tenderness present. No spinous process tenderness present. No rigidity. Decreased range of motion present. No tracheal deviation present. No Brudzinski's sign and no Kernig's sign noted.  Cardiovascular: Normal rate.   Pulmonary/Chest: Breath sounds normal.  Lymphadenopathy:    She has no cervical adenopathy.  Neurological: She is alert and oriented to person, place, and time.  Skin: Skin is warm and dry.    ED Course  Procedures (including critical care time)  Labs Reviewed - No data to display No results found.   1. DDD (degenerative disc disease), cervical       MDM          Billy Fischer, MD 12/07/12 [phone]

## 2012-12-14 ENCOUNTER — Ambulatory Visit (HOSPITAL_COMMUNITY): Payer: Medicaid Other

## 2012-12-27 ENCOUNTER — Ambulatory Visit (HOSPITAL_COMMUNITY)
Admission: RE | Admit: 2012-12-27 | Discharge: 2012-12-27 | Disposition: A | Discharge status: Home / Self Care | Payer: Medicaid Other | Source: Ambulatory Visit | Attending: Obstetrics & Gynecology | Admitting: Obstetrics & Gynecology

## 2012-12-27 ENCOUNTER — Encounter: Payer: Medicaid Other | Attending: Internal Medicine | Admitting: Dietician

## 2012-12-27 DIAGNOSIS — D252 Subserosal leiomyoma of uterus: Secondary | ICD-10-CM | POA: Insufficient documentation

## 2012-12-27 DIAGNOSIS — R1031 Right lower quadrant pain: Secondary | ICD-10-CM

## 2012-12-27 DIAGNOSIS — N949 Unspecified condition associated with female genital organs and menstrual cycle: Secondary | ICD-10-CM | POA: Insufficient documentation

## 2012-12-27 DIAGNOSIS — E119 Type 2 diabetes mellitus without complications: Secondary | ICD-10-CM | POA: Insufficient documentation

## 2012-12-27 DIAGNOSIS — Z1231 Encounter for screening mammogram for malignant neoplasm of breast: Secondary | ICD-10-CM | POA: Insufficient documentation

## 2012-12-27 DIAGNOSIS — Z713 Dietary counseling and surveillance: Secondary | ICD-10-CM | POA: Insufficient documentation

## 2012-12-27 DIAGNOSIS — N915 Oligomenorrhea, unspecified: Secondary | ICD-10-CM | POA: Insufficient documentation

## 2012-12-27 DIAGNOSIS — D251 Intramural leiomyoma of uterus: Secondary | ICD-10-CM | POA: Insufficient documentation

## 2012-12-27 NOTE — Progress Notes (Signed)
  Patient was seen on 12/27/2012 for the second of a series of three diabetes self-management courses at the [facility]. The following learning objectives were met by the patient during this course:   Explain basic nutrition maintenance and quality assurance  Describe causes, symptoms and treatment of hypoglycemia and hyperglycemia  Explain how to manage diabetes during illness  Describe the importance of good nutrition for health and healthy eating strategies  List strategies to follow meal plan when dining out  Describe the effects of alcohol on glucose and how to use it safely  Describe problem solving skills for day-to-day glucose challenges  Describe strategies to use when treatment plan needs to change  Identify important factors involved in successful weight loss  Describe ways to remain physically active  Describe the impact of regular activity on insulin resistance    Handouts given in class:  Refrigerator magnet for Sick Day Guidelines  [facility] Oral medication/insulin handout  Weight Loss Handout   Follow-Up Plan: Patient will attend the final class of the ADA Diabetes Self-Care Education.

## 2013-01-10 ENCOUNTER — Encounter: Payer: Medicaid Other | Admitting: *Deleted

## 2013-01-10 DIAGNOSIS — E119 Type 2 diabetes mellitus without complications: Secondary | ICD-10-CM

## 2013-01-10 NOTE — Patient Instructions (Signed)
Goals:  Follow Diabetes Meal Plan as instructed  Eat 3 meals and 2 snacks, every 3-5 hrs  Limit carbohydrate intake to 45 grams carbohydrate/meal  Limit carbohydrate intake to 15 grams carbohydrate/snack  Add lean protein foods to meals/snacks  Monitor glucose levels as instructed by your doctor  Aim for 30 mins of physical activity daily  Bring food record and glucose log to your next nutrition visit

## 2013-01-10 NOTE — Progress Notes (Signed)
  Patient was seen on 01/10/13 for the third of a series of three diabetes self-management courses at the [facility]. The following learning objectives were met by the patient during this course:    Describe how diabetes changes over time   Identify diabetes complications and ways to prevent them   Describe strategies that can promote heart health including lowering blood pressure and cholesterol   Describe strategies to lower dietary fat and sodium in the diet   Identify physical activities that benefit cardiovascular health   Evaluate success in meeting personal goal   Describe the belief that they can live successfully with diabetes day to day   Establish 2-3 goals that they will plan to diligently work on until they return for the free 57-month follow-up visit  The following handouts were given in class:  3 Month Follow Up Visit handout  Goal setting handout  Class evaluation form  Your patient has established the following 3 month goals for diabetes self-care:  Count carbohydrates at most meals and snacks  Increase my activity 3 days a week  Stop smoking  Test glucose 4 times each day  Follow-Up Plan: Patient will attend a 3 month follow-up visit for diabetes self-management education.

## 2013-01-15 ENCOUNTER — Other Ambulatory Visit (HOSPITAL_COMMUNITY): Payer: Self-pay | Admitting: *Deleted

## 2013-01-16 ENCOUNTER — Inpatient Hospital Stay (HOSPITAL_COMMUNITY): Admission: RE | Admit: 2013-01-16 | Payer: Medicaid Other | Source: Ambulatory Visit

## 2013-01-23 ENCOUNTER — Encounter (HOSPITAL_COMMUNITY): Payer: Medicaid Other

## 2013-02-08 ENCOUNTER — Other Ambulatory Visit: Payer: Self-pay | Admitting: Physician Assistant

## 2013-02-08 DIAGNOSIS — M545 Low back pain: Secondary | ICD-10-CM

## 2013-02-18 ENCOUNTER — Ambulatory Visit
Admission: RE | Admit: 2013-02-18 | Discharge: 2013-02-18 | Disposition: A | Discharge status: Home / Self Care | Payer: Medicaid Other | Source: Ambulatory Visit | Attending: Physician Assistant | Admitting: Physician Assistant

## 2013-05-07 DIAGNOSIS — Z0279 Encounter for issue of other medical certificate: Secondary | ICD-10-CM

## 2013-05-08 ENCOUNTER — Ambulatory Visit: Payer: Medicaid Other | Admitting: *Deleted

## 2013-05-20 ENCOUNTER — Ambulatory Visit: Payer: Medicaid Other | Admitting: Neurosurgery

## 2013-05-27 ENCOUNTER — Ambulatory Visit: Payer: Medicaid Other | Admitting: Pulmonary Disease

## 2013-05-29 ENCOUNTER — Encounter: Payer: Self-pay | Admitting: Pulmonary Disease

## 2013-05-29 ENCOUNTER — Ambulatory Visit (INDEPENDENT_AMBULATORY_CARE_PROVIDER_SITE_OTHER): Payer: Medicaid Other | Admitting: Pulmonary Disease

## 2013-05-29 VITALS — BP 144/80 | HR 77 | Temp 98.0°F | Ht 63.0 in | Wt 344.6 lb

## 2013-05-29 DIAGNOSIS — G4733 Obstructive sleep apnea (adult) (pediatric): Secondary | ICD-10-CM

## 2013-05-29 NOTE — Patient Instructions (Addendum)
Will have your equipment company get some information off your machine for [place] so that we can adjust your pressure correctly.  Will also need to check your oxygen level in the future while sleeping.  Work on weight loss followup with me in one year if doing well.

## 2013-05-29 NOTE — Assessment & Plan Note (Signed)
The patient tells me that she is wearing her CPAP fairly compliantly, and that it is greatly helping her sleep and daytime alertness.  Unfortunately, we never received a download of data from her machine, and it is unclear if she is on an appropriate pressure.  We also need to check overnight oximetry on her optimal pressure to insure that we are maintaining adequate saturations at night.  At this point, will work on optimizing her pressure, and then we'll check an overnight oximetry thereafter.  In the meantime, I have asked the patient to keep up with her mask changes and supplies, and to work aggressively on weight loss.

## 2013-05-29 NOTE — Progress Notes (Signed)
  Subjective:    Patient ID: Catherine Williams, female    DOB: [birth year], 51 y.o.   MRN: [id number]  HPI The patient comes in today for followup of her obstructive sleep apnea.  She tells me she is wearing CPAP fairly compliantly, and is not having any issues with her pressure or mask fit.  Unfortunately, her pressure has never been optimized by her medical equipment company, nor has she ever had her oxygen level optimized either.  I have stressed to her that it is very important that we do this.  The patient has gained 27 pounds since last visit.   Review of Systems  Constitutional: Negative for fever and unexpected weight change.  HENT: Positive for congestion, rhinorrhea, sneezing, trouble swallowing, postnasal drip and sinus pressure. Negative for ear pain, nosebleeds, sore throat and dental problem.   Eyes: Negative for redness and itching.  Respiratory: Positive for cough, choking, chest tightness, shortness of breath and wheezing.   Cardiovascular: Positive for chest pain ( pt feels may be from reflux). Negative for palpitations and leg swelling.  Gastrointestinal: Negative for nausea and vomiting.  Genitourinary: Negative for dysuria.  Musculoskeletal: Negative for joint swelling.  Skin: Negative for rash.  Neurological: Positive for headaches.  Hematological: Does not bruise/bleed easily.  Psychiatric/Behavioral: Negative for dysphoric mood. The patient is not nervous/anxious.        Objective:   Physical Exam Morbidly obese female in no acute distress Nose without purulence or discharge noted No skin breakdown or pressure necrosis from CPAP mask Neck without lymphadenopathy or thyromegaly Lower extremities with 1+ edema bilaterally, no cyanosis noted Appears mildly sleepy, but appropriate.  Moves all 4 extremities.       Assessment & Plan:

## 2013-06-05 ENCOUNTER — Ambulatory Visit: Payer: Self-pay | Admitting: Obstetrics & Gynecology

## 2013-06-07 ENCOUNTER — Encounter: Payer: Self-pay | Admitting: Obstetrics & Gynecology

## 2013-06-20 ENCOUNTER — Encounter: Payer: Self-pay | Admitting: Surgery

## 2013-06-24 ENCOUNTER — Encounter: Payer: Self-pay | Admitting: Surgery

## 2013-06-24 ENCOUNTER — Ambulatory Visit (INDEPENDENT_AMBULATORY_CARE_PROVIDER_SITE_OTHER): Payer: Medicaid Other | Admitting: Surgery

## 2013-06-24 VITALS — BP 154/70 | HR 73 | Ht 63.0 in | Wt 348.0 lb

## 2013-06-24 DIAGNOSIS — I739 Peripheral vascular disease, unspecified: Secondary | ICD-10-CM

## 2013-06-24 NOTE — Progress Notes (Signed)
[facility]   Patient name: Catherine Williams MRN: [id number] DOB: May 16, 1961 Sex: female     Chief Complaint  Patient presents with  . Re-evaluation    6 month f/u     HISTORY OF PRESENT ILLNESS: The patient is back today for followup. On 10/18/2011 she underwent atherectomy of her left posterior tibial artery with subsequent angioplasty. This was done in the setting of a nonhealing consultation performed by Dr. Louanne Skye. She has gone onto heal her amputation site. We have been following her vascular intervention with ultrasound. She has no complaints today. She is trying to lose weight but is having difficulty. She continues to take a statin 4 hypercholesterolemia. She is working hard controlled blood sugar and diabetes.  Past Medical History  Diagnosis Date  . Hypertension   . Heart failure   . Asthma   . Diabetes mellitus   . Hyperlipidemia   . Kidney disease   . Pinched nerve   . Morbid obesity   . COPD (chronic obstructive pulmonary disease)   . Sleep apnea   . Peripheral vascular disease     Past Surgical History  Procedure Laterality Date  . Ovary surgery      Left  . Hand surgery      right  . Atherectomy and angioplasty  10/18/2011    left posterior tibial artery  . Toe amputation  Sept. 25,2012    Left 4th and 5th toes    History   Social History  . Marital Status: Single    Spouse Name: N/A    Number of Children: Y  . Years of Education: N/A   Occupational History  . not working     prev worked in Software engineer.   Social History Main Topics  . Smoking status: Never Smoker   . Smokeless tobacco: Never Used  . Alcohol Use: No  . Drug Use: Yes    Special: Marijuana     Comment: FORMER>>smoked weed from age 59 to 38>> quit at age 71   . Sexually Active: Not on file   Other Topics Concern  . Not on file   Social History Narrative   Lives with daughter          Family History  Problem Relation Age of Onset  . Lung  cancer Mother   . Cancer Father   . Clotting disorder Mother   . Clotting disorder Sister   . Clotting disorder Brother   . Heart disease Mother   . Heart disease Sister   . Heart disease Brother     Allergies as of 06/24/2013  . (No Known Allergies)    Current Outpatient Prescriptions on File Prior to Visit  Medication Sig Dispense Refill  . amLODipine (NORVASC) 10 MG tablet Take 10 mg by mouth daily.        Marland Kitchen aspirin 81 MG tablet Take 81 mg by mouth daily.        Marland Kitchen atorvastatin (LIPITOR) 40 MG tablet Take 40 mg by mouth daily.      . diclofenac (CATAFLAM) 50 MG tablet Take 1 tablet (50 mg total) by mouth 3 (three) times daily. As needed for neck pains  21 tablet  0  . furosemide (LASIX) 80 MG tablet Take 80 mg by mouth 2 (two) times daily.      . hydrALAZINE (APRESOLINE) 25 MG tablet Take 25 mg by mouth.      . insulin aspart (NOVOLOG) 100 UNIT/ML injection Inject 6-10  Units into the skin as directed. Sliding Scale per MD      . insulin glargine (LANTUS) 100 UNIT/ML injection Inject 55 Units into the skin at bedtime.       . metoprolol (LOPRESSOR) 50 MG tablet Take 50 mg by mouth 2 (two) times daily.        . Multiple Vitamins-Minerals (MULTIVITAMIN WITH MINERALS) tablet Take 1 tablet by mouth daily.      Marland Kitchen telmisartan (MICARDIS) 80 MG tablet Take 80 mg by mouth daily.      . traMADol-acetaminophen (ULTRACET) 37.5-325 MG per tablet Take 1 tablet by mouth every 6 (six) hours as needed for pain.  30 tablet  0  . gabapentin (NEURONTIN) 100 MG capsule Take 100 mg by mouth at bedtime.      Marland Kitchen HYDROcodone-acetaminophen (NORCO) 10-325 MG per tablet Take 1 tablet by mouth every 6 (six) hours as needed.      . isosorbide dinitrate (ISORDIL) 30 MG tablet Take 30 mg by mouth 2 (two) times daily.         No current facility-administered medications on file prior to visit.     REVIEW OF SYSTEMS: Cardiovascular: No chest pain, chest pressure, palpitations,  No claudication or rest pain,  No  history of DVT or phlebitis. Positive for leg swelling Pulmonary: Positive for shortness of breath when lying flat and with exertion Neurologic: No weakness, paresthesias, aphasia, or amaurosis. No dizziness. Hematologic: No bleeding problems or clotting disorders. Musculoskeletal: No joint pain or joint swelling. Gastrointestinal: No blood in stool or hematemesis Genitourinary: No dysuria or hematuria. Psychiatric:: No history of major depression. Integumentary: No rashes or ulcers. Constitutional: No fever or chills.  PHYSICAL EXAMINATION:   Vital signs are BP 154/70  Pulse 73  Ht 5\' 3"  (1.6 m)  Wt 348 lb (157.852 kg)  BMI 61.66 kg/m2  SpO2 96% General: The patient appears their stated age. HEENT:  No gross abnormalities Pulmonary:  Non labored breathing Musculoskeletal: There are no major deformities. Neurologic: No focal weakness or paresthesias are detected, Skin: There are no ulcer or rashes noted. Psychiatric: The patient has normal affect. Cardiovascular: There is a regular rate and rhythm without significant murmur appreciated. I cannot palpate pedal pulses   Diagnostic Studies Denies I medicated today  Assessment: Status post posterior tibial atherectomy and angioplasty in the setting of a nonhealing amputation site which has gone on to heal Plan: The patient is doing very well from my perspective. She has no active ulceration at this time. I will plan on having her back in 6 months for a duplex and ABI to make sure that her intervention remains patent.  Eldridge Abrahams, M.D. [facility] Office: [phone] Pager:  [phone]

## 2013-06-29 ENCOUNTER — Other Ambulatory Visit: Payer: Self-pay | Admitting: Pulmonary Disease

## 2013-06-29 DIAGNOSIS — G4733 Obstructive sleep apnea (adult) (pediatric): Secondary | ICD-10-CM

## 2013-07-12 ENCOUNTER — Encounter: Payer: Self-pay | Admitting: Obstetrics & Gynecology

## 2013-07-25 ENCOUNTER — Encounter (HOSPITAL_COMMUNITY): Payer: Self-pay | Admitting: Emergency Medicine

## 2013-07-25 ENCOUNTER — Emergency Department (INDEPENDENT_AMBULATORY_CARE_PROVIDER_SITE_OTHER): Payer: Medicaid Other

## 2013-07-25 ENCOUNTER — Emergency Department (HOSPITAL_COMMUNITY)
Admission: EM | Admit: 2013-07-25 | Discharge: 2013-07-25 | Disposition: A | Discharge status: Home / Self Care | Payer: Medicaid Other | Source: Home / Self Care | Attending: Emergency Medicine | Admitting: Emergency Medicine

## 2013-07-25 DIAGNOSIS — N390 Urinary tract infection, site not specified: Secondary | ICD-10-CM

## 2013-07-25 DIAGNOSIS — R0789 Other chest pain: Secondary | ICD-10-CM

## 2013-07-25 DIAGNOSIS — J441 Chronic obstructive pulmonary disease with (acute) exacerbation: Secondary | ICD-10-CM

## 2013-07-25 LAB — POCT URINALYSIS DIP (DEVICE)
Protein, ur: NEGATIVE mg/dL
Urobilinogen, UA: 0.2 mg/dL (ref 0.0–1.0)
pH: 5.5 (ref 5.0–8.0)

## 2013-07-25 LAB — POCT I-STAT, CHEM 8
BUN: 44 mg/dL — ABNORMAL HIGH (ref 6–23)
Chloride: 103 mEq/L (ref 96–112)
Glucose, Bld: 369 mg/dL — ABNORMAL HIGH (ref 70–99)
HCT: 38 % (ref 36.0–46.0)
Potassium: 4.7 mEq/L (ref 3.5–5.1)

## 2013-07-25 MED ORDER — ALBUTEROL SULFATE HFA 108 (90 BASE) MCG/ACT IN AERS: 1.0000 | INHALATION_SPRAY | Freq: Four times a day (QID) | RESPIRATORY_TRACT | Status: DC | PRN

## 2013-07-25 MED ORDER — AMOXICILLIN-POT CLAVULANATE 875-125 MG PO TABS: 1.0000 | ORAL_TABLET | Freq: Two times a day (BID) | ORAL | Status: DC

## 2013-07-25 MED ORDER — BUDESONIDE-FORMOTEROL FUMARATE 160-4.5 MCG/ACT IN AERO: 2.0000 | INHALATION_SPRAY | Freq: Two times a day (BID) | RESPIRATORY_TRACT | Status: DC

## 2013-07-25 NOTE — ED Notes (Signed)
Multiple complaints: reports painful urination and low abdominal pain onset Tuesday.  Also concerned for cough, productive cough reporting thick sputum.  Fever, chills, cough causes chest soreness.

## 2013-07-25 NOTE — ED Provider Notes (Signed)
Chief Complaint:   Chief Complaint  Patient presents with  . Urinary Tract Infection  . URI    History of Present Illness:   Catherine Williams is a female with type 2 diabetes, COPD, hypertension, CHF, and chronic kidney disease who presents with a three-day history of chest pain, productive cough, sore throat, and dysuria. Her chest pain is substernal and worse with coughing or with deep inspiration. It is nonexertional. She has had some nausea and some sweats. She denies any radiation of the pain. The cough is productive of yellow sputum without blood. She feels chest tightness and wheezing. She has a history of COPD. She's felt chilled but no definite fever. She also has had sore throat, earache, aching her eyes, headache, and rhinorrhea. She denies any abdominal pain, vomiting, or diarrhea. She has felt somewhat nauseated. She also has had dysuria, frequency, urgency, and some visible blood in her urine. She's also noticed some lower back pain.  Review of Systems:  Other than noted above, the patient denies any of the following symptoms. Systemic:  No fever, chills, sweats, or fatigue. ENT:  No nasal congestion, rhinorrhea, or sore throat. Pulmonary:  No cough, wheezing, shortness of breath, sputum production, hemoptysis. Cardiac:  No palpitations, rapid heartbeat, dizziness, presyncope or syncope. GI:  No abdominal pain, heartburn, nausea, or vomiting. Ext:  No leg pain or swelling.  [place]:  Past medical history, family history, social history, meds, and allergies were reviewed and updated as needed. She has type 2 diabetes and her most recent home blood sugar was 310. She also has COPD, hypertension, chronic kidney disease, CHF. She takes numerous medications including Lantus, NovoLog, metoprolol, amlodipine, Micardis, aspirin, and Catapres-TTS.  Physical Exam:   Vital signs:  BP 152/72  Pulse 78  Temp(Src) 97.8 F (36.6 C) (Oral)  Resp 24  SpO2 97% Gen:  Alert, oriented, in no distress,  skin warm and dry. Eye:  PERRL, lids and conjunctivas normal.  Sclera non-icteric. ENT:  Mucous membranes moist, pharynx clear. Neck:  Supple, no adenopathy or tenderness.  No JVD. Lungs:  Clear to auscultation, no wheezes, rales or rhonchi.  No respiratory distress. Heart:  Regular rhythm.  No gallops, murmers, clicks or rubs. Chest:  No chest wall tenderness. Abdomen:  Soft, nontender, no organomegaly or mass.  Bowel sounds normal.  No pulsatile abdominal mass or bruit. Ext:  No edema.  No calf tenderness and Homann's sign negative.  Pulses full and equal. Skin:  Warm and dry.  No rash.  Labs:   Results for orders placed during the hospital encounter of 07/25/13  POCT URINALYSIS DIP (DEVICE)      Result Value Range   Glucose, UA 250 (*) NEGATIVE mg/dL   Bilirubin Urine NEGATIVE  NEGATIVE   Ketones, ur NEGATIVE  NEGATIVE mg/dL   Specific Gravity, Urine 1.010  1.005 - 1.030   Hgb urine dipstick NEGATIVE  NEGATIVE   pH 5.5  5.0 - 8.0   Protein, ur NEGATIVE  NEGATIVE mg/dL   Urobilinogen, UA 0.2  0.0 - 1.0 mg/dL   Nitrite NEGATIVE  NEGATIVE   Leukocytes, UA TRACE (*) NEGATIVE  POCT I-STAT, CHEM 8      Result Value Range   Sodium 137  135 - 145 mEq/L   Potassium 4.7  3.5 - 5.1 mEq/L   Chloride 103  96 - 112 mEq/L   BUN 44 (*) 6 - 23 mg/dL   Creatinine, Ser 2.20 (*) 0.50 - 1.10 mg/dL   Glucose,  Bld 369 (*) 70 - 99 mg/dL   Calcium, Ion 1.29 (*) 1.12 - 1.23 mmol/L   TCO2 28  0 - 100 mmol/L   Hemoglobin 12.9  12.0 - 15.0 g/dL   HCT 38.0  36.0 - 46.0 %    A urine culture was obtained.  Radiology:  Dg Chest 2 View  07/25/2013   *RADIOLOGY REPORT*  Clinical Data: Chest pain for 3 days  CHEST - 2 VIEW  Comparison: 09/20/2012  Findings: Upper normal heart size. Mediastinal contours and pulmonary vascularity normal. Lungs grossly clear. No pleural effusion or pneumothorax. Underpenetration secondary to body habitus. Bones unremarkable.  IMPRESSION: No acute abnormalities   Original Report  Authenticated By: Lavonia Dana, M.D.   I reviewed the images independently and personally and concur with the radiologist's findings.  EKG:   Date: 07/25/2013  Rate: 74  Rhythm: normal sinus rhythm  QRS Axis: normal  Intervals: normal  ST/T Wave abnormalities: normal  Conduction Disutrbances:left bundle branch block  Narrative Interpretation: Normal sinus rhythm, left anterior fascicular block, otherwise normal.  Old EKG Reviewed: none available  Assessment:  The primary encounter diagnosis was UTI (lower urinary tract infection). Diagnoses of COPD exacerbation and Musculoskeletal chest pain were also pertinent to this visit.  No evidence of coronary artery disease.   Plan:   1.  The following meds were prescribed:   Discharge Medication List as of 07/25/2013  1:45 PM    START taking these medications   Details  albuterol (PROVENTIL HFA;VENTOLIN HFA) 108 (90 BASE) MCG/ACT inhaler Inhale 1-2 puffs into the lungs every 6 (six) hours as needed for wheezing., Starting 07/25/2013, Until Discontinued, Normal    amoxicillin-clavulanate (AUGMENTIN) 875-125 MG per tablet Take 1 tablet by mouth 2 (two) times daily., Starting 07/25/2013, Until Discontinued, Normal    budesonide-formoterol (SYMBICORT) 160-4.5 MCG/ACT inhaler Inhale 2 puffs into the lungs 2 (two) times daily., Starting 07/25/2013, Until Discontinued, Normal       2.  The patient was instructed in symptomatic care and handouts were given. 3.  The patient was told to return if becoming worse in any way, if no better in 3 or 4 days, and given some red flag symptoms such as fever, worsening shortness of breath, or worsening pain that would indicate earlier return. 4.  Follow up here if necessary.    Harden Mo, MD 07/25/13 [phone]

## 2013-07-26 LAB — URINE CULTURE: Colony Count: 55000

## 2013-07-29 ENCOUNTER — Ambulatory Visit: Payer: Medicaid Other | Admitting: Obstetrics & Gynecology

## 2013-07-29 ENCOUNTER — Ambulatory Visit: Payer: Self-pay | Admitting: Obstetrics & Gynecology

## 2013-08-22 ENCOUNTER — Ambulatory Visit (INDEPENDENT_AMBULATORY_CARE_PROVIDER_SITE_OTHER): Payer: Medicaid Other

## 2013-08-22 ENCOUNTER — Ambulatory Visit (INDEPENDENT_AMBULATORY_CARE_PROVIDER_SITE_OTHER): Payer: Medicaid Other | Admitting: Neurology

## 2013-08-22 DIAGNOSIS — Z0289 Encounter for other administrative examinations: Secondary | ICD-10-CM

## 2013-08-22 DIAGNOSIS — E1142 Type 2 diabetes mellitus with diabetic polyneuropathy: Secondary | ICD-10-CM

## 2013-08-22 DIAGNOSIS — G544 Lumbosacral root disorders, not elsewhere classified: Secondary | ICD-10-CM

## 2013-08-22 DIAGNOSIS — R209 Unspecified disturbances of skin sensation: Secondary | ICD-10-CM

## 2013-08-22 DIAGNOSIS — M79609 Pain in unspecified limb: Secondary | ICD-10-CM

## 2013-08-22 DIAGNOSIS — IMO0002 Reserved for concepts with insufficient information to code with codable children: Secondary | ICD-10-CM

## 2013-08-22 NOTE — Procedures (Signed)
  HISTORY:  Catherine Williams is a 52 year old patient with a history of morbid obesity, diabetes, and chronic renal insufficiency. The patient reports a greater than one-year history of numbness and discomfort in the anterolateral aspect of the right thigh. The patient reports some weakness with the legs. The patient has chronic low back pain. The patient is being evaluated for a possible neuropathy or a lumbosacral radiculopathy.  NERVE CONDUCTION STUDIES:  Nerve conduction studies were performed on the right upper extremity. The distal motor latency for the right median nerve was prolonged, with a low motor amplitude. The distal motor latency and motor amplitude for the right ulnar nerve was normal. The F wave latencies and nerve conduction velocities for the right median and ulnar nerves were normal. The sensory latency for the right median nerve was prolonged, normal for the right ulnar nerve.  Nerve conduction studies were performed on both lower extremities. No response was seen for the peroneal and posterior tibial nerves bilaterally. There was no response for the H reflex latencies or for the peroneal sensory latencies on either side.  EMG STUDIES:  EMG study was performed on the right lower extremity:  The tibialis anterior muscle reveals 2 to 4K motor units with full recruitment. No fibrillations or positive waves were seen. The peroneus tertius muscle reveals 2 to 5K motor units with  decrased recruitment. No fibrillations or positive waves were seen. Polyphasic motor units were seen. The medial gastrocnemius muscle reveals 1 to 3K motor units with full recruitment. No fibrillations or positive waves were seen. The vastus lateralis muscle reveals 2 to 4K motor units with full recruitment. No fibrillations or positive waves were seen. The iliopsoas muscle reveals 2 to 4K motor units with full recruitment. No fibrillations or positive waves were seen. The biceps femoris muscle (long head)  reveals 2 to 4K motor units with full recruitment. No fibrillations or positive waves were seen. The lumbosacral paraspinal muscles were tested at 3 levels, and revealed no abnormalities of insertional activity at all 3 levels tested. There was good relaxation.   IMPRESSION:  Nerve conduction studies done on the right upper extremity and both lower extremities reveals evidence of a significant peripheral neuropathy, possibly from diabetes. There is an overlying right carpal tunnel syndrome of moderate severity. EMG evaluation of the right lower extremity shows minimal chronic stable signs of distal denervation, suggesting that the severity of the peripheral neuropathy is less than what is suggested by the nerve conduction study. There is no evidence of an overlying right lumbosacral radiculopathy. The distribution of discomfort reported by the patient could be consistent with meralgia paresthetica.  Jill Alexanders MD 08/22/2013 1:54 PM  [facility] [address] [place], [place] [address]  Phone [phone] Fax [phone]

## 2013-09-25 ENCOUNTER — Other Ambulatory Visit (HOSPITAL_COMMUNITY): Payer: Self-pay | Admitting: *Deleted

## 2013-09-26 ENCOUNTER — Ambulatory Visit (HOSPITAL_COMMUNITY)
Admission: RE | Admit: 2013-09-26 | Discharge: 2013-09-26 | Disposition: A | Discharge status: Home / Self Care | Payer: Medicaid Other | Source: Ambulatory Visit | Attending: Nephrology | Admitting: Nephrology

## 2013-09-26 DIAGNOSIS — D509 Iron deficiency anemia, unspecified: Secondary | ICD-10-CM | POA: Diagnosis not present

## 2013-09-26 DIAGNOSIS — N189 Chronic kidney disease, unspecified: Secondary | ICD-10-CM | POA: Insufficient documentation

## 2013-09-26 MED ORDER — SODIUM CHLORIDE 0.9 % IV SOLN
1020.0000 mg | Freq: Once | INTRAVENOUS | Status: AC
  Administered 2013-09-26: 1020 mg via INTRAVENOUS
  Filled 2013-09-26: qty 34

## 2013-10-10 DIAGNOSIS — Z0279 Encounter for issue of other medical certificate: Secondary | ICD-10-CM

## 2013-11-18 DIAGNOSIS — M1990 Unspecified osteoarthritis, unspecified site: Secondary | ICD-10-CM

## 2013-11-18 HISTORY — DX: Unspecified osteoarthritis, unspecified site: M19.90

## 2013-12-12 ENCOUNTER — Emergency Department (HOSPITAL_COMMUNITY)
Admission: EM | Admit: 2013-12-12 | Discharge: 2013-12-12 | Disposition: A | Discharge status: Home / Self Care | Payer: Medicaid Other | Attending: Emergency Medicine | Admitting: Emergency Medicine

## 2013-12-12 ENCOUNTER — Other Ambulatory Visit: Payer: Self-pay

## 2013-12-12 ENCOUNTER — Encounter (HOSPITAL_COMMUNITY): Payer: Self-pay | Admitting: Emergency Medicine

## 2013-12-12 ENCOUNTER — Emergency Department (HOSPITAL_COMMUNITY): Payer: Medicaid Other

## 2013-12-12 DIAGNOSIS — E785 Hyperlipidemia, unspecified: Secondary | ICD-10-CM | POA: Insufficient documentation

## 2013-12-12 DIAGNOSIS — J449 Chronic obstructive pulmonary disease, unspecified: Secondary | ICD-10-CM | POA: Insufficient documentation

## 2013-12-12 DIAGNOSIS — J4489 Other specified chronic obstructive pulmonary disease: Secondary | ICD-10-CM | POA: Insufficient documentation

## 2013-12-12 DIAGNOSIS — IMO0002 Reserved for concepts with insufficient information to code with codable children: Secondary | ICD-10-CM | POA: Insufficient documentation

## 2013-12-12 DIAGNOSIS — G4700 Insomnia, unspecified: Secondary | ICD-10-CM | POA: Insufficient documentation

## 2013-12-12 DIAGNOSIS — I10 Essential (primary) hypertension: Secondary | ICD-10-CM | POA: Insufficient documentation

## 2013-12-12 DIAGNOSIS — R1110 Vomiting, unspecified: Secondary | ICD-10-CM

## 2013-12-12 DIAGNOSIS — Z791 Long term (current) use of non-steroidal anti-inflammatories (NSAID): Secondary | ICD-10-CM | POA: Insufficient documentation

## 2013-12-12 DIAGNOSIS — Z7982 Long term (current) use of aspirin: Secondary | ICD-10-CM | POA: Insufficient documentation

## 2013-12-12 DIAGNOSIS — Z87448 Personal history of other diseases of urinary system: Secondary | ICD-10-CM | POA: Insufficient documentation

## 2013-12-12 DIAGNOSIS — Z794 Long term (current) use of insulin: Secondary | ICD-10-CM | POA: Insufficient documentation

## 2013-12-12 DIAGNOSIS — G589 Mononeuropathy, unspecified: Secondary | ICD-10-CM | POA: Insufficient documentation

## 2013-12-12 DIAGNOSIS — I509 Heart failure, unspecified: Secondary | ICD-10-CM | POA: Insufficient documentation

## 2013-12-12 DIAGNOSIS — E119 Type 2 diabetes mellitus without complications: Secondary | ICD-10-CM | POA: Insufficient documentation

## 2013-12-12 DIAGNOSIS — Z79899 Other long term (current) drug therapy: Secondary | ICD-10-CM | POA: Insufficient documentation

## 2013-12-12 DIAGNOSIS — K8020 Calculus of gallbladder without cholecystitis without obstruction: Secondary | ICD-10-CM | POA: Insufficient documentation

## 2013-12-12 LAB — CBC WITH DIFFERENTIAL/PLATELET
Basophils Relative: 0 % (ref 0–1)
HCT: 32.6 % — ABNORMAL LOW (ref 36.0–46.0)
Hemoglobin: 10.6 g/dL — ABNORMAL LOW (ref 12.0–15.0)
Lymphocytes Relative: 22 % (ref 12–46)
MCHC: 32.5 g/dL (ref 30.0–36.0)
MCV: 81.7 fL (ref 78.0–100.0)
Monocytes Absolute: 0.7 10*3/uL (ref 0.1–1.0)
Monocytes Relative: 6 % (ref 3–12)
Neutro Abs: 8.9 10*3/uL — ABNORMAL HIGH (ref 1.7–7.7)

## 2013-12-12 LAB — URINE MICROSCOPIC-ADD ON

## 2013-12-12 LAB — COMPREHENSIVE METABOLIC PANEL
BUN: 63 mg/dL — ABNORMAL HIGH (ref 6–23)
CO2: 24 mEq/L (ref 19–32)
Chloride: 97 mEq/L (ref 96–112)
Creatinine, Ser: 2.7 mg/dL — ABNORMAL HIGH (ref 0.50–1.10)
GFR calc Af Amer: 22 mL/min — ABNORMAL LOW (ref >90)
GFR calc non Af Amer: 19 mL/min — ABNORMAL LOW (ref >90)
Total Bilirubin: 0.6 mg/dL (ref 0.3–1.2)

## 2013-12-12 LAB — URINALYSIS, ROUTINE W REFLEX MICROSCOPIC
Ketones, ur: NEGATIVE mg/dL
Nitrite: NEGATIVE
Protein, ur: 30 mg/dL — AB

## 2013-12-12 LAB — LIPASE, BLOOD: Lipase: 24 U/L (ref 11–59)

## 2013-12-12 LAB — POCT I-STAT TROPONIN I

## 2013-12-12 MED ORDER — ONDANSETRON 4 MG PO TBDP: 4.0000 mg | ORAL_TABLET | Freq: Three times a day (TID) | ORAL | Status: DC | PRN

## 2013-12-12 MED ORDER — PROMETHAZINE HCL 25 MG RE SUPP: 25.0000 mg | Freq: Four times a day (QID) | RECTAL | Status: DC | PRN

## 2013-12-12 MED ORDER — SODIUM CHLORIDE 0.9 % IV BOLUS (SEPSIS)
1000.0000 mL | Freq: Once | INTRAVENOUS | Status: AC
  Administered 2013-12-12: 1000 mL via INTRAVENOUS

## 2013-12-12 MED ORDER — ONDANSETRON HCL 4 MG/2ML IJ SOLN: 4.0000 mg | Freq: Once | INTRAMUSCULAR | Status: DC

## 2013-12-12 NOTE — ED Notes (Signed)
Hypoactive bowel sounds, abdomen obese but soft, not any larger. N/V present. All organ still intact including gallbladder

## 2013-12-12 NOTE — ED Provider Notes (Signed)
CSN: [id number]     Arrival date & time 12/12/13  0300 History   First MD Initiated Contact with Patient 12/12/13 0622     Chief Complaint  Patient presents with  . Abdominal Pain   (Consider location/radiation/quality/duration/timing/severity/associated sxs/prior Treatment) HPI Comments: Catherine Williams is a 52 y.o. year-old female with a past medical history of obesity, DM, OSA, CKD, presenting the Emergency Department with a chief complaint of abdominal pain and vomiting.  The patient report waking up at 0200 with an acute onset of pain.  She reports two episodes of non-bloody emesis.  She reports two normal BM this morning. Abdominal surgery includes: Left ovarian surgery. Last normal menstrual period 12/08/2013.    The history is provided by the patient and medical records. No language interpreter was used.    Past Medical History  Diagnosis Date  . Hypertension   . Heart failure   . Asthma   . Diabetes mellitus   . Hyperlipidemia   . Kidney disease   . Pinched nerve   . Morbid obesity   . COPD (chronic obstructive pulmonary disease)   . Sleep apnea   . Peripheral vascular disease    Past Surgical History  Procedure Laterality Date  . Ovary surgery      Left  . Hand surgery      right  . Atherectomy and angioplasty  10/18/2011    left posterior tibial artery  . Toe amputation  Sept. 25,2012    Left 4th and 5th toes   Family History  Problem Relation Age of Onset  . Lung cancer Mother   . Cancer Father   . Clotting disorder Mother   . Clotting disorder Sister   . Clotting disorder Brother   . Heart disease Mother   . Heart disease Sister   . Heart disease Brother    History  Substance Use Topics  . Smoking status: Never Smoker   . Smokeless tobacco: Never Used  . Alcohol Use: No   OB History   Grav Para Term Preterm Abortions TAB SAB Ect Mult Living                 Review of Systems  Constitutional: Negative for fever and chills.  Respiratory:  Negative for cough.   Gastrointestinal: Positive for nausea, vomiting and abdominal pain. Negative for diarrhea, constipation, blood in stool and anal bleeding.  Genitourinary: Negative for dysuria.  Allergic/Immunologic: Negative for food allergies.    Allergies  Review of patient's allergies indicates no known allergies.  Home Medications   Current Outpatient Rx  Name  Route  Sig  Dispense  Refill  . albuterol (PROVENTIL HFA;VENTOLIN HFA) 108 (90 BASE) MCG/ACT inhaler   Inhalation   Inhale 1-2 puffs into the lungs every 6 (six) hours as needed for wheezing.   1 Inhaler   0   . amLODipine (NORVASC) 10 MG tablet   Oral   Take 10 mg by mouth daily.           Marland Kitchen aspirin 81 MG tablet   Oral   Take 81 mg by mouth daily.           Marland Kitchen atorvastatin (LIPITOR) 40 MG tablet   Oral   Take 40 mg by mouth daily.         . budesonide-formoterol (SYMBICORT) 160-4.5 MCG/ACT inhaler   Inhalation   Inhale 2 puffs into the lungs 2 (two) times daily.   1 Inhaler   12   .  diclofenac (CATAFLAM) 50 MG tablet   Oral   Take 1 tablet (50 mg total) by mouth 3 (three) times daily. As needed for neck pains   21 tablet   0   . furosemide (LASIX) 80 MG tablet   Oral   Take 80 mg by mouth 2 (two) times daily.         Marland Kitchen gabapentin (NEURONTIN) 100 MG capsule   Oral   Take 100 mg by mouth at bedtime.         . hydrALAZINE (APRESOLINE) 25 MG tablet   Oral   Take 25 mg by mouth.         Marland Kitchen HYDROcodone-acetaminophen (NORCO) 10-325 MG per tablet   Oral   Take 1 tablet by mouth every 6 (six) hours as needed.         . insulin aspart (NOVOLOG) 100 UNIT/ML injection   Subcutaneous   Inject 6-10 Units into the skin as directed. Sliding Scale per MD         . insulin glargine (LANTUS) 100 UNIT/ML injection   Subcutaneous   Inject 55 Units into the skin at bedtime.          . isosorbide dinitrate (ISORDIL) 30 MG tablet   Oral   Take 30 mg by mouth 2 (two) times daily.            . metoprolol (LOPRESSOR) 50 MG tablet   Oral   Take 50 mg by mouth 2 (two) times daily.           . Multiple Vitamins-Minerals (MULTIVITAMIN WITH MINERALS) tablet   Oral   Take 1 tablet by mouth daily.         Marland Kitchen telmisartan (MICARDIS) 80 MG tablet   Oral   Take 80 mg by mouth daily.         . traMADol-acetaminophen (ULTRACET) 37.5-325 MG per tablet   Oral   Take 1 tablet by mouth every 6 (six) hours as needed for pain.   30 tablet   0   . ondansetron (ZOFRAN ODT) 4 MG disintegrating tablet   Oral   Take 1 tablet (4 mg total) by mouth every 8 (eight) hours as needed for nausea or vomiting.   20 tablet   0   . promethazine (PHENERGAN) 25 MG suppository   Rectal   Place 1 suppository (25 mg total) rectally every 6 (six) hours as needed for nausea or vomiting.   12 each   0    BP 161/70  Pulse 93  Temp(Src) 98.4 F (36.9 C) (Oral)  Resp 22  Ht 5\' 3"  (1.6 m)  Wt 330 lb (149.687 kg)  BMI 58.47 kg/m2  SpO2 97% Physical Exam  Constitutional: She is oriented to person, place, and time. She appears well-developed and well-nourished. No distress.  Sleeping in room.  HENT:  Head: Normocephalic and atraumatic.  Neck: Neck supple.  Cardiovascular: Normal rate and regular rhythm.   Pulmonary/Chest: Effort normal. No respiratory distress. She has no wheezes.  Abdominal: Soft. Bowel sounds are normal. There is tenderness in the right upper quadrant. There is no rebound and no guarding.  Neurological: She is alert and oriented to person, place, and time.  Skin: Skin is warm and dry.    ED Course  Procedures (including critical care time) Labs Review Labs Reviewed  CBC WITH DIFFERENTIAL - Abnormal; Notable for the following:    WBC 12.4 (*)    Hemoglobin 10.6 (*)    HCT  32.6 (*)    Neutro Abs 8.9 (*)    All other components within normal limits  COMPREHENSIVE METABOLIC PANEL - Abnormal; Notable for the following:    Glucose, Bld 190 (*)    BUN 63 (*)     Creatinine, Ser 2.70 (*)    AST 55 (*)    GFR calc non Af Amer 19 (*)    GFR calc Af Amer 22 (*)    All other components within normal limits  URINALYSIS, ROUTINE W REFLEX MICROSCOPIC - Abnormal; Notable for the following:    APPearance TURBID (*)    Hgb urine dipstick LARGE (*)    Protein, ur 30 (*)    Leukocytes, UA TRACE (*)    All other components within normal limits  URINE MICROSCOPIC-ADD ON - Abnormal; Notable for the following:    Squamous Epithelial / LPF MANY (*)    Bacteria, UA MANY (*)    All other components within normal limits  URINE CULTURE  LIPASE, BLOOD  POCT I-STAT TROPONIN I  POCT I-STAT TROPONIN I   Imaging Review US Abdomen Complete  12/12/2013   CLINICAL DATA:  Abdominal pain  EXAM: ULTRASOUND ABDOMEN COMPLETE  COMPARISON:  09/21/2011  FINDINGS: Gallbladder:  Several small gallstones are present. No wall thickening or Murphy's sign.  Common bile duct:  Diameter: 4 mm in caliber.  Liver:  Imaging of the liver was markedly limited due to body habitus and overlying gas. No obvious mass.  IVC:  Obscured.  Pancreas:  Obscured.  Spleen:  Grossly within normal limits.  Right Kidney:  Length: 11.4 cm in length. The renal parenchyma was poorly visualized. Stable interpolar region cysts measuring 3.0 cm. No hydronephrosis.  Left Kidney:  Length: 12.2 cm in length. The left kidney was very poorly visualized. No obvious mass or hydronephrosis.  Abdominal aorta:  Portions of the aorta were obscured.  No obvious aneurysm.  Other findings:  None.  IMPRESSION: Very limited examination as described above. Several of the organs were obscured as delineated above.  Cholelithiasis.   Electronically Signed   By: Maryclare Bean M.D.   On: 12/12/2013 08:20    EKG Date: 12/12/2013  Rate: 70  Rhythm: normal sinus rhythm  QRS Axis: left  Intervals: normal  ST/T Wave abnormalities: nonspecific T wave changes  Conduction Disutrbances:none  Narrative Interpretation:  Old EKG Reviewed: No  significant change from 07/25/2013 interpreted by me   MDM   1. Cholelithiasis   2. Vomiting    Pt presents with epigastric pain. RUQ pain on exam.  Labs, fluids, [place] ordered. EMR reveals: 09/21/2011 CT- Cholelithiasis Re-eval patient reports symptoms have resolved. 0/10. Denies nausea. Discussed patient history, condition, and labs with Dr. Marlis Edelson who advises obtaining a delta troponin.  Delta troponin is negative. Re-eval pt states she is asymptomatic.  Discussed lab results, imaging results, and treatment plan with the patient. Return precautions given. Reports understanding and no other concerns at this time.  Patient is stable for discharge at this time.  Meds given in ED:  Medications  sodium chloride 0.9 % bolus 1,000 mL (1,000 mLs Intravenous New Bag/Given 12/12/13 0702)    Discharge Medication List as of 12/12/2013 11:10 AM    START taking these medications   Details  ondansetron (ZOFRAN ODT) 4 MG disintegrating tablet Take 1 tablet (4 mg total) by mouth every 8 (eight) hours as needed for nausea or vomiting., Starting 12/12/2013, Until Discontinued, Print    promethazine (PHENERGAN) 25 MG suppository Place 1  suppository (25 mg total) rectally every 6 (six) hours as needed for nausea or vomiting., Starting 12/12/2013, Until Discontinued, Print            Lorrine Kin, PA-C 12/13/13 2103

## 2013-12-12 NOTE — ED Provider Notes (Signed)
Assessment epigastric pain radiating to both upper quadrants awaken her from sleep 2 AM. She is presently asymptomatic his treatment here. On exam no distress alert Glasgow Coma Score 15 lungs clear auscultation heart regular rate abdomen morbidly obese soft nontender  Date: 12/12/2013  Rate: 70  Rhythm: normal sinus rhythm  QRS Axis: left  Intervals: normal  ST/T Wave abnormalities: nonspecific T wave changes  Conduction Disutrbances:none  Narrative Interpretation:   Old EKG Reviewed: No significant change from 07/25/2013 interpreted by me  Results for orders placed during the hospital encounter of 12/12/13  CBC WITH DIFFERENTIAL      Result Value Range   WBC 12.4 (*) 4.0 - 10.5 K/uL   RBC 3.99  3.87 - 5.11 MIL/uL   Hemoglobin 10.6 (*) 12.0 - 15.0 g/dL   HCT 32.6 (*) 36.0 - 46.0 %   MCV 81.7  78.0 - 100.0 fL   MCH 26.6  26.0 - 34.0 pg   MCHC 32.5  30.0 - 36.0 g/dL   RDW 14.9  11.5 - 15.5 %   Platelets 236  150 - 400 K/uL   Neutrophils Relative % 72  43 - 77 %   Neutro Abs 8.9 (*) 1.7 - 7.7 K/uL   Lymphocytes Relative 22  12 - 46 %   Lymphs Abs 2.7  0.7 - 4.0 K/uL   Monocytes Relative 6  3 - 12 %   Monocytes Absolute 0.7  0.1 - 1.0 K/uL   Eosinophils Relative 1  0 - 5 %   Eosinophils Absolute 0.1  0.0 - 0.7 K/uL   Basophils Relative 0  0 - 1 %   Basophils Absolute 0.0  0.0 - 0.1 K/uL  COMPREHENSIVE METABOLIC PANEL      Result Value Range   Sodium 136  135 - 145 mEq/L   Potassium 3.6  3.5 - 5.1 mEq/L   Chloride 97  96 - 112 mEq/L   CO2 24  19 - 32 mEq/L   Glucose, Bld 190 (*) 70 - 99 mg/dL   BUN 63 (*) 6 - 23 mg/dL   Creatinine, Ser 2.70 (*) 0.50 - 1.10 mg/dL   Calcium 10.3  8.4 - 10.5 mg/dL   Total Protein 7.9  6.0 - 8.3 g/dL   Albumin 3.5  3.5 - 5.2 g/dL   AST 55 (*) 0 - 37 U/L   ALT 32  0 - 35 U/L   Alkaline Phosphatase 102  39 - 117 U/L   Total Bilirubin 0.6  0.3 - 1.2 mg/dL   GFR calc non Af Amer 19 (*) >90 mL/min   GFR calc Af Amer 22 (*) >90 mL/min  LIPASE,  BLOOD      Result Value Range   Lipase 24  11 - 59 U/L  URINALYSIS, ROUTINE W REFLEX MICROSCOPIC      Result Value Range   Color, Urine YELLOW  YELLOW   APPearance TURBID (*) CLEAR   Specific Gravity, Urine 1.014  1.005 - 1.030   pH 5.0  5.0 - 8.0   Glucose, UA NEGATIVE  NEGATIVE mg/dL   Hgb urine dipstick LARGE (*) NEGATIVE   Bilirubin Urine NEGATIVE  NEGATIVE   Ketones, ur NEGATIVE  NEGATIVE mg/dL   Protein, ur 30 (*) NEGATIVE mg/dL   Urobilinogen, UA 1.0  0.0 - 1.0 mg/dL   Nitrite NEGATIVE  NEGATIVE   Leukocytes, UA TRACE (*) NEGATIVE  URINE MICROSCOPIC-ADD ON      Result Value Range   Squamous Epithelial /  LPF MANY (*) RARE   WBC, UA 7-10  <3 WBC/hpf   RBC / HPF 21-50  <3 RBC/hpf   Bacteria, UA MANY (*) RARE  POCT I-STAT TROPONIN I      Result Value Range   Troponin i, poc 0.01  0.00 - 0.08 ng/mL   Comment 3           POCT I-STAT TROPONIN I      Result Value Range   Troponin i, poc 0.01  0.00 - 0.08 ng/mL   Comment 3            US Abdomen Complete  12/12/2013   CLINICAL DATA:  Abdominal pain  EXAM: ULTRASOUND ABDOMEN COMPLETE  COMPARISON:  09/21/2011  FINDINGS: Gallbladder:  Several small gallstones are present. No wall thickening or Murphy's sign.  Common bile duct:  Diameter: 4 mm in caliber.  Liver:  Imaging of the liver was markedly limited due to body habitus and overlying gas. No obvious mass.  IVC:  Obscured.  Pancreas:  Obscured.  Spleen:  Grossly within normal limits.  Right Kidney:  Length: 11.4 cm in length. The renal parenchyma was poorly visualized. Stable interpolar region cysts measuring 3.0 cm. No hydronephrosis.  Left Kidney:  Length: 12.2 cm in length. The left kidney was very poorly visualized. No obvious mass or hydronephrosis.  Abdominal aorta:  Portions of the aorta were obscured.  No obvious aneurysm.  Other findings:  None.  IMPRESSION: Very limited examination as described above. Several of the organs were obscured as delineated above.  Cholelithiasis.    Electronically Signed   By: Maryclare Bean M.D.   On: 12/12/2013 08:20     Orlie Dakin, MD 12/12/13 1125

## 2013-12-12 NOTE — ED Notes (Signed)
Pt arrived to the ED with a complaint of abdominal pain.  Pt states her abdomen hurts in the upper abdomen area radiating around to her back.  Pt states the pain started around 2000 hrs yesterday.

## 2013-12-13 LAB — URINE CULTURE: Colony Count: 3000

## 2013-12-16 NOTE — ED Provider Notes (Signed)
Medical screening examination/treatment/procedure(s) were conducted as a shared visit with non-physician practitioner(s) and myself.  I personally evaluated the patient during the encounter.  EKG Interpretation    Date/Time:  Thursday December 12 2013 04:13:12 EST Ventricular Rate:  71 PR Interval:  152 QRS Duration: 112 QT Interval:  435 QTC Calculation: 473 R Axis:   -60 Text Interpretation:  Sinus rhythm Consider left atrial enlargement Abnormal R-wave progression, late transition LVH with IVCD, LAD and secondary repol abnrm Baseline wander in lead(s) II III aVR aVF V1 ED PHYSICIAN INTERPRETATION AVAILABLE IN [facility] Confirmed by TEST, RECORD ([id number]) on 12/16/2013 10:51:23 AM             Orlie Dakin, MD 12/16/13 1455

## 2013-12-18 ENCOUNTER — Encounter (HOSPITAL_COMMUNITY): Payer: Self-pay | Admitting: Emergency Medicine

## 2013-12-18 ENCOUNTER — Emergency Department (HOSPITAL_COMMUNITY): Payer: Medicaid Other

## 2013-12-18 ENCOUNTER — Emergency Department (HOSPITAL_COMMUNITY)
Admission: EM | Admit: 2013-12-18 | Discharge: 2013-12-18 | Disposition: A | Discharge status: Home / Self Care | Payer: Medicaid Other | Attending: Emergency Medicine | Admitting: Emergency Medicine

## 2013-12-18 DIAGNOSIS — E119 Type 2 diabetes mellitus without complications: Secondary | ICD-10-CM | POA: Insufficient documentation

## 2013-12-18 DIAGNOSIS — M7741 Metatarsalgia, right foot: Secondary | ICD-10-CM

## 2013-12-18 DIAGNOSIS — G4730 Sleep apnea, unspecified: Secondary | ICD-10-CM | POA: Insufficient documentation

## 2013-12-18 DIAGNOSIS — I10 Essential (primary) hypertension: Secondary | ICD-10-CM | POA: Insufficient documentation

## 2013-12-18 DIAGNOSIS — E785 Hyperlipidemia, unspecified: Secondary | ICD-10-CM | POA: Insufficient documentation

## 2013-12-18 DIAGNOSIS — Z79899 Other long term (current) drug therapy: Secondary | ICD-10-CM | POA: Insufficient documentation

## 2013-12-18 DIAGNOSIS — Z7982 Long term (current) use of aspirin: Secondary | ICD-10-CM | POA: Insufficient documentation

## 2013-12-18 DIAGNOSIS — Z87448 Personal history of other diseases of urinary system: Secondary | ICD-10-CM | POA: Insufficient documentation

## 2013-12-18 DIAGNOSIS — M25579 Pain in unspecified ankle and joints of unspecified foot: Secondary | ICD-10-CM | POA: Insufficient documentation

## 2013-12-18 DIAGNOSIS — M79671 Pain in right foot: Secondary | ICD-10-CM

## 2013-12-18 DIAGNOSIS — S98139A Complete traumatic amputation of one unspecified lesser toe, initial encounter: Secondary | ICD-10-CM | POA: Insufficient documentation

## 2013-12-18 DIAGNOSIS — J4489 Other specified chronic obstructive pulmonary disease: Secondary | ICD-10-CM | POA: Insufficient documentation

## 2013-12-18 DIAGNOSIS — J449 Chronic obstructive pulmonary disease, unspecified: Secondary | ICD-10-CM | POA: Insufficient documentation

## 2013-12-18 DIAGNOSIS — Z791 Long term (current) use of non-steroidal anti-inflammatories (NSAID): Secondary | ICD-10-CM | POA: Insufficient documentation

## 2013-12-18 DIAGNOSIS — IMO0002 Reserved for concepts with insufficient information to code with codable children: Secondary | ICD-10-CM | POA: Insufficient documentation

## 2013-12-18 DIAGNOSIS — I509 Heart failure, unspecified: Secondary | ICD-10-CM | POA: Insufficient documentation

## 2013-12-18 DIAGNOSIS — Z794 Long term (current) use of insulin: Secondary | ICD-10-CM | POA: Insufficient documentation

## 2013-12-18 DIAGNOSIS — Z8669 Personal history of other diseases of the nervous system and sense organs: Secondary | ICD-10-CM | POA: Insufficient documentation

## 2013-12-18 NOTE — ED Notes (Signed)
PT c/o R foot pain since Monday.  Denies injury.  Pain appears to be greatest on the ankle joint itself.  No redness, warmth or gross abnormality noted, though some swelling.

## 2013-12-18 NOTE — ED Provider Notes (Signed)
CSN: [id number]     Arrival date & time 12/18/13  1730 History  This chart was scribed for non-physician practitioner Etta Quill, NP working with Hoy Morn, MD by Anastasia Pall, ED scribe. This patient was seen in room TR05C/TR05C and the patient's care was started at 6:03 PM.    Chief Complaint  Patient presents with  . Foot Pain    Patient is a 52 y.o. female presenting with lower extremity pain. The history is provided by the patient. No language interpreter was used.  Foot Pain This is a new problem. Episode onset: 3 days ago. The problem occurs constantly. The problem has not changed since onset.Associated symptoms comments: Pt denies infection . The symptoms are aggravated by walking and standing. Nothing relieves the symptoms. Treatments tried: Tramadol.   HPI Comments: Catherine Williams is a 52 y.o. female with h/o DM and obesity who presents to the Emergency Department complaining of gradually worsening, moderate, constant, right foot/ankle pain, worse on the right, bottom side and heel, onset 3 days ago. She reports that ambulation, and standing exacerbates the pain. She reports taking Tramadol, with no relief. She reports being disabled and sitting down, laying down a lot. She denies infection, and any other symptoms. She reports being seen here recently for abdominal pain 6 days ago. She states she has not had abdominal pain since then.   PCP - Philis Fendt, MD  Past Medical History  Diagnosis Date  . Hypertension   . Heart failure   . Asthma   . Diabetes mellitus   . Hyperlipidemia   . Kidney disease   . Pinched nerve   . Morbid obesity   . COPD (chronic obstructive pulmonary disease)   . Sleep apnea   . Peripheral vascular disease    Past Surgical History  Procedure Laterality Date  . Ovary surgery      Left  . Hand surgery      right  . Atherectomy and angioplasty  10/18/2011    left posterior tibial artery  . 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 Lung cancer Mother   . Cancer Father   . Clotting disorder Mother   . Clotting disorder Sister   . Clotting disorder Brother   . Heart disease Mother   . Heart disease Sister   . Heart disease Brother    History  Substance Use Topics  . Smoking status: Never Smoker   . Smokeless tobacco: Never Used  . Alcohol Use: No   OB History   Grav Para Term Preterm Abortions TAB SAB Ect Mult Living                 Review of Systems  Musculoskeletal: Positive for arthralgias (right foot/ankle) and joint swelling.  Skin: Negative for color change.  All other systems reviewed and are negative.    Allergies  Review of patient's allergies indicates no known allergies.  Home Medications   Current Outpatient Rx  Name  Route  Sig  Dispense  Refill  . albuterol (PROVENTIL HFA;VENTOLIN HFA) 108 (90 BASE) MCG/ACT inhaler   Inhalation   Inhale 1-2 puffs into the lungs every 6 (six) hours as needed for wheezing.   1 Inhaler   0   . amLODipine (NORVASC) 10 MG tablet   Oral   Take 10 mg by mouth daily.           Marland Kitchen  aspirin 81 MG tablet   Oral   Take 81 mg by mouth daily.           Marland Kitchen atorvastatin (LIPITOR) 40 MG tablet   Oral   Take 40 mg by mouth daily.         . budesonide-formoterol (SYMBICORT) 160-4.5 MCG/ACT inhaler   Inhalation   Inhale 2 puffs into the lungs 2 (two) times daily.   1 Inhaler   12   . diclofenac (CATAFLAM) 50 MG tablet   Oral   Take 1 tablet (50 mg total) by mouth 3 (three) times daily. As needed for neck pains   21 tablet   0   . furosemide (LASIX) 80 MG tablet   Oral   Take 80 mg by mouth 2 (two) times daily.         Marland Kitchen gabapentin (NEURONTIN) 100 MG capsule   Oral   Take 100 mg by mouth at bedtime.         . hydrALAZINE (APRESOLINE) 25 MG tablet   Oral   Take 25 mg by mouth.         Marland Kitchen HYDROcodone-acetaminophen (NORCO) 10-325 MG per tablet   Oral   Take 1 tablet by mouth  every 6 (six) hours as needed.         . insulin aspart (NOVOLOG) 100 UNIT/ML injection   Subcutaneous   Inject 6-10 Units into the skin as directed. Sliding Scale per MD         . insulin glargine (LANTUS) 100 UNIT/ML injection   Subcutaneous   Inject 55 Units into the skin at bedtime.          . isosorbide dinitrate (ISORDIL) 30 MG tablet   Oral   Take 30 mg by mouth 2 (two) times daily.           . metoprolol (LOPRESSOR) 50 MG tablet   Oral   Take 50 mg by mouth 2 (two) times daily.           . Multiple Vitamins-Minerals (MULTIVITAMIN WITH MINERALS) tablet   Oral   Take 1 tablet by mouth daily.         . ondansetron (ZOFRAN ODT) 4 MG disintegrating tablet   Oral   Take 1 tablet (4 mg total) by mouth every 8 (eight) hours as needed for nausea or vomiting.   20 tablet   0   . promethazine (PHENERGAN) 25 MG suppository   Rectal   Place 1 suppository (25 mg total) rectally every 6 (six) hours as needed for nausea or vomiting.   12 each   0   . telmisartan (MICARDIS) 80 MG tablet   Oral   Take 80 mg by mouth daily.         . traMADol-acetaminophen (ULTRACET) 37.5-325 MG per tablet   Oral   Take 1 tablet by mouth every 6 (six) hours as needed for pain.   30 tablet   0    BP 160/72  Pulse 91  Temp(Src) 99 F (37.2 C) (Oral)  Resp 18  Wt 330 lb 6 oz (149.857 kg)  SpO2 95%  Physical Exam  Nursing note and vitals reviewed. Constitutional: She is oriented to person, place, and time. She appears well-developed and well-nourished. No distress.  Pt is obese.  HENT:  Head: Normocephalic and atraumatic.  Eyes: EOM are normal.  Neck: Neck supple. No tracheal deviation present.  Cardiovascular: Normal rate, regular rhythm and normal heart sounds.  Exam reveals  no gallop and no friction rub.   No murmur heard. DP intact. Good cap refill.  Pulmonary/Chest: Effort normal. No respiratory distress. She has no wheezes. She has no rales.  Musculoskeletal:  Normal range of motion.  Tenderness along plantar surface from toes to heel. Worse with flexion.   Neurological: She is alert and oriented to person, place, and time.  Skin: Skin is warm and dry.  Psychiatric: She has a normal mood and affect. Her behavior is normal.    ED Course  Procedures (including critical care time)  DIAGNOSTIC STUDIES: Oxygen Saturation is 95% on room air, adequate by my interpretation.    COORDINATION OF CARE: 6:07 PM-Discussed treatment plan which includes right foot xray with pt at bedside and pt agreed to plan. Will give pt anti inflammatory.    Labs Review Labs Reviewed - No data to display Imaging Review No results found.  EKG Interpretation   None      No orders of the defined types were placed in this encounter.   Radiology results reviewed and shared with patient. MDM  Foot pain plantar aspect.  Pes planus.  History of diabetes.  No indication of soft-tissue or bone infection. RICE, crutches, PCP follow-up.    I personally performed the services described in this documentation, which was scribed in my presence. The recorded information has been reviewed and is accurate.    Norman Herrlich, NP 12/18/13 [phone]

## 2013-12-19 NOTE — ED Provider Notes (Signed)
Medical screening examination/treatment/procedure(s) were performed by non-physician practitioner and as supervising physician I was immediately available for consultation/collaboration.  EKG Interpretation   None         Hoy Morn, MD 12/19/13 [phone]

## 2013-12-27 ENCOUNTER — Encounter: Payer: Self-pay | Admitting: Family

## 2013-12-30 ENCOUNTER — Ambulatory Visit (HOSPITAL_COMMUNITY)
Admission: RE | Admit: 2013-12-30 | Discharge: 2013-12-30 | Disposition: A | Discharge status: Home / Self Care | Payer: Medicaid Other | Source: Ambulatory Visit | Attending: Family | Admitting: Family

## 2013-12-30 ENCOUNTER — Encounter (HOSPITAL_COMMUNITY): Payer: Medicaid Other

## 2013-12-30 ENCOUNTER — Encounter: Payer: Self-pay | Admitting: Family

## 2013-12-30 ENCOUNTER — Ambulatory Visit (INDEPENDENT_AMBULATORY_CARE_PROVIDER_SITE_OTHER): Payer: Medicaid Other | Admitting: Family

## 2013-12-30 ENCOUNTER — Ambulatory Visit (INDEPENDENT_AMBULATORY_CARE_PROVIDER_SITE_OTHER)
Admission: RE | Admit: 2013-12-30 | Discharge: 2013-12-30 | Disposition: A | Discharge status: Home / Self Care | Payer: Medicaid Other | Source: Ambulatory Visit | Attending: Family | Admitting: Family

## 2013-12-30 VITALS — BP 121/65 | HR 75 | Resp 16 | Ht 63.0 in | Wt 326.0 lb

## 2013-12-30 DIAGNOSIS — Z48812 Encounter for surgical aftercare following surgery on the circulatory system: Secondary | ICD-10-CM | POA: Insufficient documentation

## 2013-12-30 DIAGNOSIS — I739 Peripheral vascular disease, unspecified: Secondary | ICD-10-CM

## 2013-12-30 NOTE — Addendum Note (Signed)
Addended by: Mena Goes on: 12/30/2013 04:30 PM   Modules accepted: Orders

## 2013-12-30 NOTE — Patient Instructions (Signed)
Peripheral Vascular Disease Peripheral Vascular Disease (PVD), also called Peripheral Arterial Disease (PAD), is a circulation problem caused by cholesterol (atherosclerotic plaque) deposits in the arteries. PVD commonly occurs in the lower extremities (legs) but it can occur in other areas of the body, such as your arms. The cholesterol buildup in the arteries reduces blood flow which can cause pain and other serious problems. The presence of PVD can place a person at risk for Coronary Artery Disease (CAD).  CAUSES  Causes of PVD can be many. It is usually associated with more than one risk factor such as:   High Cholesterol.  Smoking.  Diabetes.  Lack of exercise or inactivity.  High blood pressure (hypertension).  Obesity.  Family history. SYMPTOMS   When the lower extremities are affected, patients with PVD may experience:  Leg pain with exertion or physical activity. This is called INTERMITTENT CLAUDICATION. This may present as cramping or numbness with physical activity. The location of the pain is associated with the level of blockage. For example, blockage at the abdominal level (distal abdominal aorta) may result in buttock or hip pain. Lower leg arterial blockage may result in calf pain.  As PVD becomes more severe, pain can develop with less physical activity.  In people with severe PVD, leg pain may occur at rest.  Other PVD signs and symptoms:  Leg numbness or weakness.  Coldness in the affected leg or foot, especially when compared to the other leg.  A change in leg color.  Patients with significant PVD are more prone to ulcers or sores on toes, feet or legs. These may take longer to heal or may reoccur. The ulcers or sores can become infected.  If signs and symptoms of PVD are ignored, gangrene may occur. This can result in the loss of toes or loss of an entire limb.  Not all leg pain is related to PVD. Other medical conditions can cause leg pain such  as:  Blood clots (embolism) or Deep Vein Thrombosis.  Inflammation of the blood vessels (vasculitis).  Spinal stenosis. DIAGNOSIS  Diagnosis of PVD can involve several different types of tests. These can include:  Pulse Volume Recording Method (PVR). This test is simple, painless and does not involve the use of X-rays. PVR involves measuring and comparing the blood pressure in the arms and legs. An ABI (Ankle-Brachial Index) is calculated. The normal ratio of blood pressures is 1. As this number becomes smaller, it indicates more severe disease.  < 0.95  indicates significant narrowing in one or more leg vessels.  <0.8 there will usually be pain in the foot, leg or buttock with exercise.  <0.4 will usually have pain in the legs at rest.  <0.25  usually indicates limb threatening PVD.  Doppler detection of pulses in the legs. This test is painless and checks to see if you have a pulses in your legs/feet.  A dye or contrast material (a substance that highlights the blood vessels so they show up on x-ray) may be given to help your caregiver better see the arteries for the following tests. The dye is eliminated from your body by the kidney's. Your caregiver may order blood work to check your kidney function and other laboratory values before the following tests are performed:  Magnetic Resonance Angiography (MRA). An MRA is a picture study of the blood vessels and arteries. The MRA machine uses a large magnet to produce images of the blood vessels.  Computed Tomography Angiography (CTA). A CTA is a   specialized x-ray that looks at how the blood flows in your blood vessels. An IV may be inserted into your arm so contrast dye can be injected.  Angiogram. Is a procedure that uses x-rays to look at your blood vessels. This procedure is minimally invasive, meaning a small incision (cut) is made in your groin. A small tube (catheter) is then inserted into the artery of your groin. The catheter is  guided to the blood vessel or artery your caregiver wants to examine. Contrast dye is injected into the catheter. X-rays are then taken of the blood vessel or artery. After the images are obtained, the catheter is taken out. TREATMENT  Treatment of PVD involves many interventions which may include:  Lifestyle changes:  Quitting smoking.  Exercise.  Following a low fat, low cholesterol diet.  Control of diabetes.  Foot care is very important to the PVD patient. Good foot care can help prevent infection.  Medication:  Cholesterol-lowering medicine.  Blood pressure medicine.  Anti-platelet drugs.  Certain medicines may reduce symptoms of Intermittent Claudication.  Interventional/Surgical options:  Angioplasty. An Angioplasty is a procedure that inflates a balloon in the blocked artery. This opens the blocked artery to improve blood flow.  Stent Implant. A wire mesh tube (stent) is placed in the artery. The stent expands and stays in place, allowing the artery to remain open.  Peripheral Bypass Surgery. This is a surgical procedure that reroutes the blood around a blocked artery to help improve blood flow. This type of procedure may be performed if Angioplasty or stent implants are not an option. SEEK IMMEDIATE MEDICAL CARE IF:   You develop pain or numbness in your arms or legs.  Your arm or leg turns cold, becomes blue in color.  You develop redness, warmth, swelling and pain in your arms or legs. MAKE SURE YOU:   Understand these instructions.  Will watch your condition.  Will get help right away if you are not doing well or get worse. Document Released: 01/12/2005 Document Revised: 02/27/2012 Document Reviewed: 12/09/2008 [employer] Patient Information 2014 [employer].  

## 2013-12-30 NOTE — Progress Notes (Signed)
[facility] [place] HISTORY AND PHYSICAL -PAD  History of Present Illness Catherine Williams is a 53 y.o. female patient of Dr. 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 She returns today for follow up. She denies claudication in lower extremities with walking, denies non-healing wounds. She does report mild tingling and numbness in toes of both feet that she says is neuropathy.  Pt reports New Medical or Surgical History: she was recently diagnosed with gout.  Pt Diabetic: Yes, states under much better control Pt smoker: non-smoker  Pt meds include: Statin :Yes Betablocker: Yes ASA: Yes Other anticoagulants/antiplatelets: no  Past Medical History  Diagnosis Date  . Hypertension   . Heart failure   . Asthma   . Diabetes mellitus   . Hyperlipidemia   . Kidney disease   . Pinched nerve   . Morbid obesity   . COPD (chronic obstructive pulmonary disease)   . Sleep apnea   . Peripheral vascular disease     Social History History  Substance Use Topics  . Smoking status: Never Smoker   . Smokeless tobacco: Never Used  . Alcohol Use: No    Family History Family History  Problem Relation Age of Onset  . Lung cancer Mother   . Cancer Father   . Clotting disorder Mother   . Clotting disorder Sister   . Clotting disorder Brother   . Heart disease Mother   . Heart disease Sister   . Heart disease Brother     Past Surgical History  Procedure Laterality Date  . Ovary surgery      Left  . Hand surgery      right  . Atherectomy and angioplasty  10/18/2011    left posterior tibial artery  . Toe amputation  Sept. 25,2012    Left 4th and 5th toes    No Known Allergies  Current Outpatient Prescriptions  Medication Sig Dispense Refill  . albuterol (PROVENTIL HFA;VENTOLIN HFA) 108 (90 BASE) MCG/ACT inhaler  Inhale 1-2 puffs into the lungs every 6 (six) hours as needed for wheezing.  1 Inhaler  0  . amLODipine (NORVASC) 10 MG tablet Take 10 mg by mouth daily.        Marland Kitchen aspirin 81 MG tablet Take 81 mg by mouth daily.        . budesonide-formoterol (SYMBICORT) 160-4.5 MCG/ACT inhaler Inhale 2 puffs into the lungs 2 (two) times daily.  1 Inhaler  12  . furosemide (LASIX) 80 MG tablet Take 80 mg by mouth 2 (two) times daily.      Marland Kitchen gabapentin (NEURONTIN) 100 MG capsule Take 100 mg by mouth at bedtime.      . hydrALAZINE (APRESOLINE) 25 MG tablet Take 25 mg by mouth daily.       Marland Kitchen HYDROcodone-acetaminophen (NORCO) 10-325 MG per tablet Take 1 tablet by mouth every 6 (six) hours as needed for moderate pain.       Marland Kitchen insulin aspart (NOVOLOG) 100 UNIT/ML injection Inject 6-10 Units into the skin 3 (three) times daily with meals. Sliding Scale per MD      . insulin glargine (LANTUS) 100 UNIT/ML injection Inject 70 Units into the skin at bedtime.       . isosorbide dinitrate (ISORDIL) 30 MG tablet Take 30 mg by mouth 2 (two) times daily.        Marland Kitchen  metoprolol (LOPRESSOR) 50 MG tablet Take 50 mg by mouth 2 (two) times daily.        . ondansetron (ZOFRAN ODT) 4 MG disintegrating tablet Take 1 tablet (4 mg total) by mouth every 8 (eight) hours as needed for nausea or vomiting.  20 tablet  0  . predniSONE (DELTASONE) 10 MG tablet Take 10 mg by mouth daily with breakfast.      . promethazine (PHENERGAN) 25 MG suppository Place 1 suppository (25 mg total) rectally every 6 (six) hours as needed for nausea or vomiting.  12 each  0  . rosuvastatin (CRESTOR) 40 MG tablet Take 40 mg by mouth daily.      Marland Kitchen telmisartan (MICARDIS) 80 MG tablet Take 80 mg by mouth daily.      . traMADol-acetaminophen (ULTRACET) 37.5-325 MG per tablet Take 1 tablet by mouth every 6 (six) hours as needed for pain.  30 tablet  0  . atorvastatin (LIPITOR) 40 MG tablet Take 40 mg by mouth daily.       No current facility-administered medications for  this visit.    ROS: See HPI for pertinent positives and negatives.   Physical Examination  Filed Vitals:   12/30/13 1410  BP: 121/65  Pulse: 75  Resp: 16   Filed Weights   12/30/13 1410  Weight: 326 lb (147.873 kg)   Body mass index is 57.76 kg/(m^2).  General: A&O x 3, morbidly obese female. Gait: using cane, slow and deliberate Eyes: PERRLA, Pulmonary: CTAB, without wheezes , rales or rhonchi Cardiac: regular Rythm , without murmur          Carotid Bruits Left Right   Negative Negative   Radial pulses: are 2+ and =                           VASCULAR EXAM: Extremities without ischemic changes  without Gangrene; without open wounds. Left 4th and 5th toes amputation site well healed.                                                                                                          LE Pulses LEFT RIGHT       POPLITEAL  not palpable   not palpable       POSTERIOR TIBIAL  not palpable   palpable        DORSALIS PEDIS      ANTERIOR TIBIAL  palpable   palpable    Abdomen: soft, NT, no masses. Skin: no rashes, no ulcers noted. Musculoskeletal: no muscle wasting or atrophy. Left 4th and 5th toes are surgically absent.  Neurologic: A&O X 3; Appropriate Affect ; SENSATION: normal; MOTOR FUNCTION:  moving all extremities equally, motor strength 5/5 in UE's, 3/5 in LE's. Speech is fluent/normal. CN 2-12 intact.  Non-Invasive Vascular Imaging: DATE: 12/30/2013 ABI: RIGHT 0.92, Waveforms: biphasic;  LEFT 0.91, Waveforms: biphasic Previous (05/07/12) ABI's: Right: 0.80, Left: Non-compressible. DUPLEX SCAN OF BYPASS: technically difficult due to body habitus and calcified arterial walls. No focal stenosis visualized,  limited visualization mid to distal FA.  ASSESSMENT: Catherine Williams is a 53 y.o. female who is status post posterior tibial atherectomy and angioplasty on 10/18/2011. 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 There seems to be an improvement in the left lower extremity in that and ABI is able to be obtained; 19 months ago the LLE arteries were non-compressible. The Duplex of the LLE arterial system indicates no focal stenosis, but visualization is limited due to body habitus. She feels better and attributes this to her DM under much better control. I congratulated her on her efforts and encouraged her to continue healthy lifestyle habits.  PLAN:  I discussed in depth with the patient the nature of atherosclerosis, and emphasized the importance of maximal medical management including strict control of blood pressure, blood glucose, and lipid levels, obtaining regular exercise, and continued cessation of smoking.  The patient is aware that without maximal medical management the underlying atherosclerotic disease process will progress, limiting the benefit of any interventions. Based on the patient's vascular studies and examination, pt advised to return to clinic in 1 year for ABI's and LLE arterial Duplex.  The patient was given information about PAD including signs, symptoms, treatment, what symptoms should prompt the patient to seek immediate medical care, and risk reduction measures to take.  Clemon Chambers, RN, MSN, FNP-C [facility] Phone: [phone]  Clinic MD: Trula Slade  12/30/2013 2:05 PM

## 2014-01-02 ENCOUNTER — Ambulatory Visit: Payer: Medicaid Other | Attending: Specialist | Admitting: Physical Therapy

## 2014-01-02 DIAGNOSIS — S98139A Complete traumatic amputation of one unspecified lesser toe, initial encounter: Secondary | ICD-10-CM | POA: Insufficient documentation

## 2014-01-02 DIAGNOSIS — R269 Unspecified abnormalities of gait and mobility: Secondary | ICD-10-CM | POA: Insufficient documentation

## 2014-01-02 DIAGNOSIS — R262 Difficulty in walking, not elsewhere classified: Secondary | ICD-10-CM | POA: Insufficient documentation

## 2014-01-02 DIAGNOSIS — R5381 Other malaise: Secondary | ICD-10-CM | POA: Insufficient documentation

## 2014-01-02 DIAGNOSIS — M25579 Pain in unspecified ankle and joints of unspecified foot: Secondary | ICD-10-CM | POA: Insufficient documentation

## 2014-01-02 DIAGNOSIS — M545 Low back pain, unspecified: Secondary | ICD-10-CM | POA: Insufficient documentation

## 2014-01-02 DIAGNOSIS — IMO0001 Reserved for inherently not codable concepts without codable children: Secondary | ICD-10-CM | POA: Insufficient documentation

## 2014-01-02 DIAGNOSIS — M79609 Pain in unspecified limb: Secondary | ICD-10-CM | POA: Insufficient documentation

## 2014-04-23 DIAGNOSIS — R809 Proteinuria, unspecified: Secondary | ICD-10-CM | POA: Diagnosis not present

## 2014-04-23 DIAGNOSIS — N179 Acute kidney failure, unspecified: Secondary | ICD-10-CM | POA: Diagnosis not present

## 2014-04-23 DIAGNOSIS — D509 Iron deficiency anemia, unspecified: Secondary | ICD-10-CM | POA: Diagnosis not present

## 2014-04-23 DIAGNOSIS — E1129 Type 2 diabetes mellitus with other diabetic kidney complication: Secondary | ICD-10-CM | POA: Diagnosis not present

## 2014-04-23 DIAGNOSIS — N2581 Secondary hyperparathyroidism of renal origin: Secondary | ICD-10-CM | POA: Diagnosis not present

## 2014-04-23 DIAGNOSIS — I129 Hypertensive chronic kidney disease with stage 1 through stage 4 chronic kidney disease, or unspecified chronic kidney disease: Secondary | ICD-10-CM | POA: Diagnosis not present

## 2014-04-23 DIAGNOSIS — N183 Chronic kidney disease, stage 3 unspecified: Secondary | ICD-10-CM | POA: Diagnosis not present

## 2014-04-24 DIAGNOSIS — J449 Chronic obstructive pulmonary disease, unspecified: Secondary | ICD-10-CM | POA: Diagnosis not present

## 2014-04-24 DIAGNOSIS — H359 Unspecified retinal disorder: Secondary | ICD-10-CM | POA: Diagnosis not present

## 2014-04-24 DIAGNOSIS — I10 Essential (primary) hypertension: Secondary | ICD-10-CM | POA: Diagnosis not present

## 2014-04-24 DIAGNOSIS — E1149 Type 2 diabetes mellitus with other diabetic neurological complication: Secondary | ICD-10-CM | POA: Diagnosis not present

## 2014-04-24 DIAGNOSIS — E1165 Type 2 diabetes mellitus with hyperglycemia: Secondary | ICD-10-CM | POA: Diagnosis not present

## 2014-04-24 DIAGNOSIS — E11359 Type 2 diabetes mellitus with proliferative diabetic retinopathy without macular edema: Secondary | ICD-10-CM | POA: Diagnosis not present

## 2014-04-24 DIAGNOSIS — E785 Hyperlipidemia, unspecified: Secondary | ICD-10-CM | POA: Diagnosis not present

## 2014-04-24 DIAGNOSIS — E1139 Type 2 diabetes mellitus with other diabetic ophthalmic complication: Secondary | ICD-10-CM | POA: Diagnosis not present

## 2014-05-21 DIAGNOSIS — M5137 Other intervertebral disc degeneration, lumbosacral region: Secondary | ICD-10-CM | POA: Diagnosis not present

## 2014-05-21 DIAGNOSIS — M545 Low back pain, unspecified: Secondary | ICD-10-CM | POA: Diagnosis not present

## 2014-06-12 ENCOUNTER — Emergency Department (HOSPITAL_COMMUNITY)
Admission: EM | Admit: 2014-06-12 | Discharge: 2014-06-12 | Disposition: A | Discharge status: Home / Self Care | Payer: Medicare Other | Source: Home / Self Care | Attending: Family Medicine | Admitting: Family Medicine

## 2014-06-12 ENCOUNTER — Encounter (HOSPITAL_COMMUNITY): Payer: Self-pay | Admitting: Emergency Medicine

## 2014-06-12 DIAGNOSIS — J0100 Acute maxillary sinusitis, unspecified: Secondary | ICD-10-CM | POA: Diagnosis not present

## 2014-06-12 MED ORDER — IPRATROPIUM BROMIDE 0.06 % NA SOLN: 2.0000 | Freq: Four times a day (QID) | NASAL | Status: DC

## 2014-06-12 MED ORDER — ONDANSETRON 4 MG PO TBDP: 8.0000 mg | ORAL_TABLET | Freq: Once | ORAL | Status: DC

## 2014-06-12 MED ORDER — AMOXICILLIN-POT CLAVULANATE 875-125 MG PO TABS: 1.0000 | ORAL_TABLET | Freq: Two times a day (BID) | ORAL | Status: DC

## 2014-06-12 NOTE — ED Notes (Signed)
C/o facial pain States she tried otc medication  No hx of these sx States ear hurt, head, eyes and throat

## 2014-06-12 NOTE — Discharge Instructions (Signed)
Thank you for coming in today. Call or go to the emergency room if you get worse, have trouble breathing, have chest pains, or palpitations.   Sinusitis Sinusitis is redness, soreness, and swelling (inflammation) of the paranasal sinuses. Paranasal sinuses are air pockets within the bones of your face (beneath the eyes, the middle of the forehead, or above the eyes). In healthy paranasal sinuses, mucus is able to drain out, and air is able to circulate through them by way of your nose. However, when your paranasal sinuses are inflamed, mucus and air can become trapped. This can allow bacteria and other germs to grow and cause infection. Sinusitis can develop quickly and last only a short time (acute) or continue over a long period (chronic). Sinusitis that lasts for more than 12 weeks is considered chronic.  CAUSES  Causes of sinusitis include:  Allergies.  Structural abnormalities, such as displacement of the cartilage that separates your nostrils (deviated septum), which can decrease the air flow through your nose and sinuses and affect sinus drainage.  Functional abnormalities, such as when the small hairs (cilia) that line your sinuses and help remove mucus do not work properly or are not present. SYMPTOMS  Symptoms of acute and chronic sinusitis are the same. The primary symptoms are pain and pressure around the affected sinuses. Other symptoms include:  Upper toothache.  Earache.  Headache.  Bad breath.  Decreased sense of smell and taste.  A cough, which worsens when you are lying flat.  Fatigue.  Fever.  Thick drainage from your nose, which often is green and may contain pus (purulent).  Swelling and warmth over the affected sinuses. DIAGNOSIS  Your caregiver will perform a physical exam. During the exam, your caregiver may:  Look in your nose for signs of abnormal growths in your nostrils (nasal polyps).  Tap over the affected sinus to check for signs of  infection.  View the inside of your sinuses (endoscopy) with a special imaging device with a light attached (endoscope), which is inserted into your sinuses. If your caregiver suspects that you have chronic sinusitis, one or more of the following tests may be recommended:  Allergy tests.  Nasal culture--A sample of mucus is taken from your nose and sent to a lab and screened for bacteria.  Nasal cytology--A sample of mucus is taken from your nose and examined by your caregiver to determine if your sinusitis is related to an allergy. TREATMENT  Most cases of acute sinusitis are related to a viral infection and will resolve on their own within 10 days. Sometimes medicines are prescribed to help relieve symptoms (pain medicine, decongestants, nasal steroid sprays, or saline sprays).  However, for sinusitis related to a bacterial infection, your caregiver will prescribe antibiotic medicines. These are medicines that will help kill the bacteria causing the infection.  Rarely, sinusitis is caused by a fungal infection. In theses cases, your caregiver will prescribe antifungal medicine. For some cases of chronic sinusitis, surgery is needed. Generally, these are cases in which sinusitis recurs more than 3 times per year, despite other treatments. HOME CARE INSTRUCTIONS   Drink plenty of water. Water helps thin the mucus so your sinuses can drain more easily.  Use a humidifier.  Inhale steam 3 to 4 times a day (for example, sit in the bathroom with the shower running).  Apply a warm, moist washcloth to your face 3 to 4 times a day, or as directed by your caregiver.  Use saline nasal sprays to help  moisten and clean your sinuses.  Take over-the-counter or prescription medicines for pain, discomfort, or fever only as directed by your caregiver. SEEK IMMEDIATE MEDICAL CARE IF:  You have increasing pain or severe headaches.  You have nausea, vomiting, or drowsiness.  You have swelling around  your face.  You have vision problems.  You have a stiff neck.  You have difficulty breathing. MAKE SURE YOU:   Understand these instructions.  Will watch your condition.  Will get help right away if you are not doing well or get worse. Document Released: 12/05/2005 Document Revised: 02/27/2012 Document Reviewed: 12/20/2011 [facility] Patient Information 2015 [place], [place]. This information is not intended to replace advice given to you by your health care provider. Make sure you discuss any questions you have with your health care provider.

## 2014-06-12 NOTE — ED Provider Notes (Signed)
Catherine Williams is a 53 y.o. female who presents to Urgent Care today for facial pain and pressure. Symptoms present for the last 3-4 days. She notes some cough and congestion. She also notes some chills. She denies any nausea vomiting diarrhea or trouble breathing. She's tried some over-the-counter medications which have helped some. She feels well otherwise. No vision trouble.   Past Medical History  Diagnosis Date  . Hypertension   . Heart failure   . Asthma   . Diabetes mellitus   . Hyperlipidemia   . Kidney disease   . Pinched nerve   . Morbid obesity   . COPD (chronic obstructive pulmonary disease)   . Sleep apnea   . Peripheral vascular disease   . Arthritis Dec. 2014    Gout  . Anemia   . CHF (congestive heart failure)    History  Substance Use Topics  . Smoking status: Never Smoker   . 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  . Alcohol Use: No   ROS as above Medications: No current facility-administered medications for this encounter.   Current Outpatient Prescriptions  Medication Sig Dispense Refill  . albuterol (PROVENTIL HFA;VENTOLIN HFA) 108 (90 BASE) MCG/ACT inhaler Inhale 1-2 puffs into the lungs every 6 (six) hours as needed for wheezing.  1 Inhaler  0  . amLODipine (NORVASC) 10 MG tablet Take 10 mg by mouth daily.        Marland Kitchen amoxicillin-clavulanate (AUGMENTIN) 875-125 MG per tablet Take 1 tablet by mouth every 12 (twelve) hours.  14 tablet  0  . aspirin 81 MG tablet Take 81 mg by mouth daily.        Marland Kitchen atorvastatin (LIPITOR) 40 MG tablet Take 40 mg by mouth daily.      . budesonide-formoterol (SYMBICORT) 160-4.5 MCG/ACT inhaler Inhale 2 puffs into the lungs 2 (two) times daily.  1 Inhaler  12  . furosemide (LASIX) 80 MG tablet Take 80 mg by mouth 2 (two) times daily.      Marland Kitchen gabapentin (NEURONTIN) 100 MG capsule Take 100 mg by mouth at bedtime.      . hydrALAZINE (APRESOLINE) 25 MG tablet Take 25 mg by mouth daily.        Marland Kitchen HYDROcodone-acetaminophen (NORCO) 10-325 MG per tablet Take 1 tablet by mouth every 6 (six) hours as needed for moderate pain.       Marland Kitchen insulin aspart (NOVOLOG) 100 UNIT/ML injection Inject 6-10 Units into the skin 3 (three) times daily with meals. Sliding Scale per MD      . insulin glargine (LANTUS) 100 UNIT/ML injection Inject 70 Units into the skin at bedtime.       Marland Kitchen ipratropium (ATROVENT) 0.06 % nasal spray Place 2 sprays into both nostrils 4 (four) times daily.  15 mL  1  . isosorbide dinitrate (ISORDIL) 30 MG tablet Take 30 mg by mouth 2 (two) times daily.        . metoprolol (LOPRESSOR) 50 MG tablet Take 50 mg by mouth 2 (two) times daily.        . ondansetron (ZOFRAN ODT) 4 MG disintegrating tablet Take 1 tablet (4 mg total) by mouth every 8 (eight) hours as needed for nausea or vomiting.  20 tablet  0  . predniSONE (DELTASONE) 10 MG tablet Take 10 mg by mouth daily with breakfast.      . promethazine (PHENERGAN) 25 MG suppository Place 1 suppository (25 mg total)  rectally every 6 (six) hours as needed for nausea or vomiting.  12 each  0  . rosuvastatin (CRESTOR) 40 MG tablet Take 40 mg by mouth daily.      Marland Kitchen telmisartan (MICARDIS) 80 MG tablet Take 80 mg by mouth daily.      . traMADol-acetaminophen (ULTRACET) 37.5-325 MG per tablet Take 1 tablet by mouth every 6 (six) hours as needed for pain.  30 tablet  0    Exam:  BP 150/86  Pulse 79  Temp(Src) 98.7 F (37.1 C) (Oral)  Resp 22  SpO2 95% Gen: Well NAD HEENT: EOMI,  MMM mildly tender maxillary sinuses bilaterally. Tympanic membranes are normal appearing bilaterally. Posterior pharynx cobblestoning. Clear nasal discharge present. Lungs: Normal work of breathing. CTABL Heart: RRR no MRG Abd: NABS, Soft. NT, ND Exts: Brisk capillary refill, warm and well perfused.   No results found for this or any previous visit (from the past 24 hour(s)). No results found.  Assessment and Plan: 53 y.o. female with sinusitis. Plan to  treat with Atrovent nasal spray and Augmentin. Followup as needed.  Discussed warning signs or symptoms. Please see discharge instructions. Patient expresses understanding.    Gregor Hams, MD 06/12/14 1538

## 2014-06-26 DIAGNOSIS — I10 Essential (primary) hypertension: Secondary | ICD-10-CM | POA: Diagnosis not present

## 2014-06-26 DIAGNOSIS — E669 Obesity, unspecified: Secondary | ICD-10-CM | POA: Diagnosis not present

## 2014-06-26 DIAGNOSIS — I509 Heart failure, unspecified: Secondary | ICD-10-CM | POA: Diagnosis not present

## 2014-06-26 DIAGNOSIS — E1149 Type 2 diabetes mellitus with other diabetic neurological complication: Secondary | ICD-10-CM | POA: Diagnosis not present

## 2014-07-04 ENCOUNTER — Ambulatory Visit: Payer: Medicaid Other | Admitting: Pulmonary Disease

## 2014-07-25 ENCOUNTER — Ambulatory Visit: Payer: Medicaid Other | Admitting: Pulmonary Disease

## 2014-07-31 DIAGNOSIS — N183 Chronic kidney disease, stage 3 unspecified: Secondary | ICD-10-CM | POA: Diagnosis not present

## 2014-08-06 DIAGNOSIS — L97509 Non-pressure chronic ulcer of other part of unspecified foot with unspecified severity: Secondary | ICD-10-CM | POA: Diagnosis not present

## 2014-08-06 DIAGNOSIS — E1149 Type 2 diabetes mellitus with other diabetic neurological complication: Secondary | ICD-10-CM | POA: Diagnosis not present

## 2014-09-19 DIAGNOSIS — D509 Iron deficiency anemia, unspecified: Secondary | ICD-10-CM | POA: Diagnosis not present

## 2014-09-19 DIAGNOSIS — I129 Hypertensive chronic kidney disease with stage 1 through stage 4 chronic kidney disease, or unspecified chronic kidney disease: Secondary | ICD-10-CM | POA: Diagnosis not present

## 2014-09-19 DIAGNOSIS — N2581 Secondary hyperparathyroidism of renal origin: Secondary | ICD-10-CM | POA: Diagnosis not present

## 2014-09-19 DIAGNOSIS — E1129 Type 2 diabetes mellitus with other diabetic kidney complication: Secondary | ICD-10-CM | POA: Diagnosis not present

## 2014-09-19 DIAGNOSIS — R809 Proteinuria, unspecified: Secondary | ICD-10-CM | POA: Diagnosis not present

## 2014-09-19 DIAGNOSIS — N183 Chronic kidney disease, stage 3 (moderate): Secondary | ICD-10-CM | POA: Diagnosis not present

## 2014-09-25 DIAGNOSIS — Z13220 Encounter for screening for lipoid disorders: Secondary | ICD-10-CM | POA: Diagnosis not present

## 2014-09-25 DIAGNOSIS — E784 Other hyperlipidemia: Secondary | ICD-10-CM | POA: Diagnosis not present

## 2014-09-25 DIAGNOSIS — I509 Heart failure, unspecified: Secondary | ICD-10-CM | POA: Diagnosis not present

## 2014-09-25 DIAGNOSIS — E1040 Type 1 diabetes mellitus with diabetic neuropathy, unspecified: Secondary | ICD-10-CM | POA: Diagnosis not present

## 2014-09-25 DIAGNOSIS — E1140 Type 2 diabetes mellitus with diabetic neuropathy, unspecified: Secondary | ICD-10-CM | POA: Diagnosis not present

## 2014-09-25 DIAGNOSIS — I10 Essential (primary) hypertension: Secondary | ICD-10-CM | POA: Diagnosis not present

## 2014-09-26 ENCOUNTER — Other Ambulatory Visit (HOSPITAL_COMMUNITY): Payer: Self-pay | Admitting: *Deleted

## 2014-09-29 ENCOUNTER — Encounter (HOSPITAL_COMMUNITY): Admission: RE | Admit: 2014-09-29 | Payer: Medicaid Other | Source: Ambulatory Visit

## 2014-10-07 ENCOUNTER — Other Ambulatory Visit (HOSPITAL_COMMUNITY): Payer: Self-pay | Admitting: *Deleted

## 2014-10-08 ENCOUNTER — Inpatient Hospital Stay (HOSPITAL_COMMUNITY): Admission: RE | Admit: 2014-10-08 | Payer: Medicaid Other | Source: Ambulatory Visit

## 2014-11-06 DIAGNOSIS — J449 Chronic obstructive pulmonary disease, unspecified: Secondary | ICD-10-CM | POA: Diagnosis not present

## 2014-11-06 DIAGNOSIS — E1140 Type 2 diabetes mellitus with diabetic neuropathy, unspecified: Secondary | ICD-10-CM | POA: Diagnosis not present

## 2014-11-06 DIAGNOSIS — E784 Other hyperlipidemia: Secondary | ICD-10-CM | POA: Diagnosis not present

## 2014-11-06 DIAGNOSIS — I10 Essential (primary) hypertension: Secondary | ICD-10-CM | POA: Diagnosis not present

## 2014-11-06 DIAGNOSIS — M1A9XX0 Chronic gout, unspecified, without tophus (tophi): Secondary | ICD-10-CM | POA: Diagnosis not present

## 2014-11-22 ENCOUNTER — Encounter (HOSPITAL_COMMUNITY): Payer: Self-pay | Admitting: Nurse Practitioner

## 2014-11-22 ENCOUNTER — Emergency Department (HOSPITAL_COMMUNITY): Payer: Medicare Other

## 2014-11-22 ENCOUNTER — Emergency Department (HOSPITAL_COMMUNITY)
Admission: EM | Admit: 2014-11-22 | Discharge: 2014-11-22 | Disposition: A | Discharge status: Home / Self Care | Payer: Medicare Other | Attending: Emergency Medicine | Admitting: Emergency Medicine

## 2014-11-22 DIAGNOSIS — Z79899 Other long term (current) drug therapy: Secondary | ICD-10-CM | POA: Insufficient documentation

## 2014-11-22 DIAGNOSIS — R0989 Other specified symptoms and signs involving the circulatory and respiratory systems: Secondary | ICD-10-CM | POA: Diagnosis not present

## 2014-11-22 DIAGNOSIS — Z7982 Long term (current) use of aspirin: Secondary | ICD-10-CM | POA: Diagnosis not present

## 2014-11-22 DIAGNOSIS — J441 Chronic obstructive pulmonary disease with (acute) exacerbation: Secondary | ICD-10-CM | POA: Insufficient documentation

## 2014-11-22 DIAGNOSIS — N189 Chronic kidney disease, unspecified: Secondary | ICD-10-CM | POA: Diagnosis not present

## 2014-11-22 DIAGNOSIS — Z794 Long term (current) use of insulin: Secondary | ICD-10-CM | POA: Insufficient documentation

## 2014-11-22 DIAGNOSIS — I129 Hypertensive chronic kidney disease with stage 1 through stage 4 chronic kidney disease, or unspecified chronic kidney disease: Secondary | ICD-10-CM | POA: Diagnosis not present

## 2014-11-22 DIAGNOSIS — R0602 Shortness of breath: Secondary | ICD-10-CM

## 2014-11-22 DIAGNOSIS — Z862 Personal history of diseases of the blood and blood-forming organs and certain disorders involving the immune mechanism: Secondary | ICD-10-CM | POA: Insufficient documentation

## 2014-11-22 DIAGNOSIS — Z792 Long term (current) use of antibiotics: Secondary | ICD-10-CM | POA: Insufficient documentation

## 2014-11-22 DIAGNOSIS — R059 Cough, unspecified: Secondary | ICD-10-CM

## 2014-11-22 DIAGNOSIS — I509 Heart failure, unspecified: Secondary | ICD-10-CM | POA: Diagnosis not present

## 2014-11-22 DIAGNOSIS — R05 Cough: Secondary | ICD-10-CM | POA: Diagnosis not present

## 2014-11-22 DIAGNOSIS — R918 Other nonspecific abnormal finding of lung field: Secondary | ICD-10-CM | POA: Diagnosis not present

## 2014-11-22 DIAGNOSIS — Z7952 Long term (current) use of systemic steroids: Secondary | ICD-10-CM | POA: Diagnosis not present

## 2014-11-22 DIAGNOSIS — E119 Type 2 diabetes mellitus without complications: Secondary | ICD-10-CM | POA: Diagnosis not present

## 2014-11-22 LAB — BASIC METABOLIC PANEL
Anion gap: 13 (ref 5–15)
BUN: 49 mg/dL — ABNORMAL HIGH (ref 6–23)
CHLORIDE: 99 meq/L (ref 96–112)
CO2: 30 meq/L (ref 19–32)
Calcium: 9.7 mg/dL (ref 8.4–10.5)
Creatinine, Ser: 2.48 mg/dL — ABNORMAL HIGH (ref 0.50–1.10)
GFR calc Af Amer: 24 mL/min — ABNORMAL LOW (ref >90)
GFR calc non Af Amer: 21 mL/min — ABNORMAL LOW (ref >90)
GLUCOSE: 187 mg/dL — AB (ref 70–99)
Potassium: 4.4 mEq/L (ref 3.7–5.3)
SODIUM: 142 meq/L (ref 137–147)

## 2014-11-22 LAB — CBC
HCT: 34 % — ABNORMAL LOW (ref 36.0–46.0)
HEMOGLOBIN: 10 g/dL — AB (ref 12.0–15.0)
MCH: 24.4 pg — ABNORMAL LOW (ref 26.0–34.0)
MCHC: 29.4 g/dL — ABNORMAL LOW (ref 30.0–36.0)
MCV: 83.1 fL (ref 78.0–100.0)
Platelets: 234 10*3/uL (ref 150–400)
RBC: 4.09 MIL/uL (ref 3.87–5.11)
RDW: 16.9 % — ABNORMAL HIGH (ref 11.5–15.5)
WBC: 8.6 10*3/uL (ref 4.0–10.5)

## 2014-11-22 LAB — I-STAT TROPONIN, ED: TROPONIN I, POC: 0 ng/mL (ref 0.00–0.08)

## 2014-11-22 LAB — PRO B NATRIURETIC PEPTIDE: Pro B Natriuretic peptide (BNP): 279.8 pg/mL — ABNORMAL HIGH (ref 0–125)

## 2014-11-22 MED ORDER — PREDNISONE 20 MG PO TABS: 40.0000 mg | ORAL_TABLET | Freq: Every day | ORAL | Status: DC

## 2014-11-22 MED ORDER — PREDNISONE 20 MG PO TABS
40.0000 mg | ORAL_TABLET | Freq: Once | ORAL | Status: AC
  Administered 2014-11-22: 40 mg via ORAL
  Filled 2014-11-22: qty 2

## 2014-11-22 MED ORDER — LEVOFLOXACIN 750 MG PO TABS: 750.0000 mg | ORAL_TABLET | Freq: Every day | ORAL | Status: DC

## 2014-11-22 MED ORDER — IPRATROPIUM-ALBUTEROL 0.5-2.5 (3) MG/3ML IN SOLN: 3.0000 mL | RESPIRATORY_TRACT | Status: DC

## 2014-11-22 MED ORDER — IPRATROPIUM-ALBUTEROL 0.5-2.5 (3) MG/3ML IN SOLN
9.0000 mL | Freq: Once | RESPIRATORY_TRACT | Status: AC
  Administered 2014-11-22: 9 mL via RESPIRATORY_TRACT
  Filled 2014-11-22: qty 9

## 2014-11-22 NOTE — ED Notes (Signed)
MD at bedside. 

## 2014-11-22 NOTE — ED Provider Notes (Signed)
CSN: [id number]     Arrival date & time 11/22/14  1842 History   First MD Initiated Contact with Patient 11/22/14 1902     Chief Complaint  Patient presents with  . Shortness of Breath     (Consider location/radiation/quality/duration/timing/severity/associated sxs/prior Treatment) Patient is a 53 y.o. female presenting with shortness of breath.  Shortness of Breath Severity:  Mild Onset quality:  Gradual Duration:  2 weeks Timing:  Constant Progression:  Worsening Chronicity:  New Context: not activity, not pollens, not strong odors and not URI   Relieved by:  None tried Worsened by:  Nothing tried Ineffective treatments:  None tried Associated symptoms: no abdominal pain, no chest pain, no cough, no fever, no headaches and no neck pain     Past Medical History  Diagnosis Date  . Hypertension   . Heart failure   . Asthma   . Diabetes mellitus   . Hyperlipidemia   . Kidney disease   . Pinched nerve   . Morbid obesity   . COPD (chronic obstructive pulmonary disease)   . Sleep apnea   . Peripheral vascular disease   . Arthritis Dec. 2014    Gout  . Anemia   . CHF (congestive heart failure)    Past Surgical History  Procedure Laterality Date  . Ovary surgery      Left  . Hand surgery      right  . Atherectomy and angioplasty  10/18/2011    left posterior tibial artery  . Toe amputation  Sept. 25,2012    Left 4th and 5th toes   Family History  Problem Relation Age of Onset  . Lung cancer Mother   . Clotting disorder Mother   . Heart disease Mother   . Cancer Mother     Lung  . Deep vein thrombosis Mother   . Diabetes Mother   . Hypertension Mother   . Varicose Veins Mother   . Cancer Father   . Clotting disorder Sister   . Heart attack Sister   . Clotting disorder Brother   . Heart disease Sister   . Heart disease Brother    History  Substance Use Topics  . Smoking status: Never Smoker   . Smokeless tobacco: Never Used     Comment: Patient has  been drug FREE for 19 years.  . Alcohol Use: No   OB History    No data available     Review of Systems  Constitutional: Negative for fever and activity change.  HENT: Negative for congestion and facial swelling.   Eyes: Negative for discharge and redness.  Respiratory: Positive for shortness of breath. Negative for cough.   Cardiovascular: Negative for chest pain and palpitations.  Gastrointestinal: Negative for abdominal pain and abdominal distention.  Endocrine: Negative for polydipsia and polyuria.  Genitourinary: Negative for dysuria and menstrual problem.  Musculoskeletal: Negative for back pain, joint swelling and neck pain.  Skin: Negative for color change and wound.  Neurological: Negative for dizziness, light-headedness and headaches.      Allergies  Review of patient's allergies indicates no known allergies.  Home Medications   Prior to Admission medications   Medication Sig Start Date End Date Taking? Authorizing Provider  albuterol (PROVENTIL HFA;VENTOLIN HFA) 108 (90 BASE) MCG/ACT inhaler Inhale 1-2 puffs into the lungs every 6 (six) hours as needed for wheezing. 07/25/13   Harden Mo, MD  amLODipine (NORVASC) 10 MG tablet Take 10 mg by mouth daily.  Historical Provider, MD  amoxicillin-clavulanate (AUGMENTIN) 875-125 MG per tablet Take 1 tablet by mouth every 12 (twelve) hours. 06/12/14   Gregor Hams, MD  aspirin 81 MG tablet Take 81 mg by mouth daily.      Historical Provider, MD  atorvastatin (LIPITOR) 40 MG tablet Take 40 mg by mouth daily.    Historical Provider, MD  budesonide-formoterol (SYMBICORT) 160-4.5 MCG/ACT inhaler Inhale 2 puffs into the lungs 2 (two) times daily. 07/25/13   Harden Mo, MD  furosemide (LASIX) 80 MG tablet Take 80 mg by mouth 2 (two) times daily.    Historical Provider, MD  gabapentin (NEURONTIN) 100 MG capsule Take 100 mg by mouth at bedtime.    Historical Provider, MD  hydrALAZINE (APRESOLINE) 25 MG tablet Take 25 mg by  mouth daily.     Historical Provider, MD  HYDROcodone-acetaminophen (NORCO) 10-325 MG per tablet Take 1 tablet by mouth every 6 (six) hours as needed for moderate pain.     Historical Provider, MD  insulin aspart (NOVOLOG) 100 UNIT/ML injection Inject 6-10 Units into the skin 3 (three) times daily with meals. Sliding Scale per MD    Historical Provider, MD  insulin glargine (LANTUS) 100 UNIT/ML injection Inject 70 Units into the skin at bedtime.     Historical Provider, MD  ipratropium (ATROVENT) 0.06 % nasal spray Place 2 sprays into both nostrils 4 (four) times daily. 06/12/14   Gregor Hams, MD  isosorbide dinitrate (ISORDIL) 30 MG tablet Take 30 mg by mouth 2 (two) times daily.      Historical Provider, MD  metoprolol (LOPRESSOR) 50 MG tablet Take 50 mg by mouth 2 (two) times daily.      Historical Provider, MD  ondansetron (ZOFRAN ODT) 4 MG disintegrating tablet Take 1 tablet (4 mg total) by mouth every 8 (eight) hours as needed for nausea or vomiting. 12/12/13   Harvie Heck, PA-C  predniSONE (DELTASONE) 10 MG tablet Take 10 mg by mouth daily with breakfast.    Historical Provider, MD  promethazine (PHENERGAN) 25 MG suppository Place 1 suppository (25 mg total) rectally every 6 (six) hours as needed for nausea or vomiting. 12/12/13   Harvie Heck, PA-C  rosuvastatin (CRESTOR) 40 MG tablet Take 40 mg by mouth daily.    Historical Provider, MD  telmisartan (MICARDIS) 80 MG tablet Take 80 mg by mouth daily.    Historical Provider, MD  traMADol-acetaminophen (ULTRACET) 37.5-325 MG per tablet Take 1 tablet by mouth every 6 (six) hours as needed for pain. 09/20/12   Nicole Pisciotta, PA-C   BP 154/69 mmHg  Pulse 79  Temp(Src) 98.1 F (36.7 C) (Oral)  Resp 22  SpO2 92% Physical Exam  Constitutional: She is oriented to person, place, and time. She appears well-developed and well-nourished.  HENT:  Head: Normocephalic and atraumatic.  Eyes: Conjunctivae and EOM are normal. Right eye exhibits no  discharge. Left eye exhibits no discharge.  Cardiovascular: Normal rate and regular rhythm.   Pulmonary/Chest: Effort normal. No respiratory distress. She has wheezes.  Abdominal: Soft. She exhibits no distension. There is no tenderness. There is no rebound.  Musculoskeletal: Normal range of motion. She exhibits no edema or tenderness.  Neurological: She is alert and oriented to person, place, and time.  Skin: Skin is warm and dry.  Nursing note and vitals reviewed.   ED Course  Procedures (including critical care time) Labs Review Labs Reviewed  CBC - Abnormal; Notable for the following:    Hemoglobin 10.0 (*)  HCT 34.0 (*)    MCH 24.4 (*)    MCHC 29.4 (*)    RDW 16.9 (*)    All other components within normal limits  BASIC METABOLIC PANEL  PRO B NATRIURETIC PEPTIDE  I-STAT TROPOININ, ED    Imaging Review No results found.   EKG Interpretation None        MDM   Final diagnoses:  Cough  SOB (shortness of breath)    89-year-old female with 2 weeks of shortness of breath. No chest pain. Also worsening weakness. No fevers but has had a productive cough and sinus progressively worsened. Has history of COPD and feels that this is similar to previous COPD exacerbations. Albuterol helped at home but did not fix it totally. Subcutaneous further evaluation. On exam patient has wheezing throughout with diminished breath sounds. No respiratory distress due to is given with improvement in symptoms. Patient felt a lot better chest x-ray and rest of labs were negative. PE with the patient since she had a previous long trip however she states says this is like her previous COPD exacerbations and feels better now so we will not continue the workup at this time. Patient states that she has some intermittent right greater than left leg swelling. She has constant swelling in both her legs is unsure of her right leg is very little left. So DVT study was ordered for tomorrow    Merrily Pew, MD 11/23/14 [id number]  Merrily Pew, MD 11/23/14 [id number]  Pamella Pert, MD 11/23/14 1154

## 2014-11-22 NOTE — ED Notes (Signed)
She c/o feeling sob x 2 weeks. Sob increased on exertion. Over past days shes noticed pain in her back and sides and a cough with light yellow mucous. She is able so speak in full sentences.

## 2014-11-23 ENCOUNTER — Ambulatory Visit (HOSPITAL_COMMUNITY): Payer: Medicaid Other | Attending: Emergency Medicine

## 2014-12-04 DIAGNOSIS — M5440 Lumbago with sciatica, unspecified side: Secondary | ICD-10-CM | POA: Diagnosis not present

## 2014-12-10 ENCOUNTER — Other Ambulatory Visit: Payer: Self-pay | Admitting: Surgery

## 2014-12-10 DIAGNOSIS — M545 Low back pain: Secondary | ICD-10-CM

## 2014-12-20 ENCOUNTER — Ambulatory Visit
Admission: RE | Admit: 2014-12-20 | Discharge: 2014-12-20 | Disposition: A | Discharge status: Home / Self Care | Payer: Medicaid Other | Source: Ambulatory Visit | Attending: Surgery | Admitting: Surgery

## 2014-12-20 DIAGNOSIS — M545 Low back pain: Secondary | ICD-10-CM

## 2014-12-20 DIAGNOSIS — M5126 Other intervertebral disc displacement, lumbar region: Secondary | ICD-10-CM | POA: Diagnosis not present

## 2014-12-25 DIAGNOSIS — I10 Essential (primary) hypertension: Secondary | ICD-10-CM | POA: Diagnosis not present

## 2014-12-25 DIAGNOSIS — J069 Acute upper respiratory infection, unspecified: Secondary | ICD-10-CM | POA: Diagnosis not present

## 2014-12-25 DIAGNOSIS — J449 Chronic obstructive pulmonary disease, unspecified: Secondary | ICD-10-CM | POA: Diagnosis not present

## 2014-12-25 DIAGNOSIS — I509 Heart failure, unspecified: Secondary | ICD-10-CM | POA: Diagnosis not present

## 2014-12-25 DIAGNOSIS — E1140 Type 2 diabetes mellitus with diabetic neuropathy, unspecified: Secondary | ICD-10-CM | POA: Diagnosis not present

## 2014-12-25 DIAGNOSIS — K219 Gastro-esophageal reflux disease without esophagitis: Secondary | ICD-10-CM | POA: Diagnosis not present

## 2014-12-25 DIAGNOSIS — Z6841 Body Mass Index (BMI) 40.0 and over, adult: Secondary | ICD-10-CM | POA: Diagnosis not present

## 2015-01-02 ENCOUNTER — Encounter: Payer: Self-pay | Admitting: Family

## 2015-01-05 ENCOUNTER — Other Ambulatory Visit (HOSPITAL_COMMUNITY): Payer: Medicare Other

## 2015-01-05 ENCOUNTER — Encounter (HOSPITAL_COMMUNITY): Payer: Medicare Other

## 2015-01-05 ENCOUNTER — Ambulatory Visit: Payer: Medicare Other | Admitting: Family

## 2015-01-07 ENCOUNTER — Encounter: Payer: Self-pay | Admitting: Family

## 2015-01-07 DIAGNOSIS — M4806 Spinal stenosis, lumbar region: Secondary | ICD-10-CM | POA: Diagnosis not present

## 2015-01-07 DIAGNOSIS — M5440 Lumbago with sciatica, unspecified side: Secondary | ICD-10-CM | POA: Diagnosis not present

## 2015-01-08 ENCOUNTER — Ambulatory Visit (INDEPENDENT_AMBULATORY_CARE_PROVIDER_SITE_OTHER): Payer: Medicare Other | Admitting: Family

## 2015-01-08 ENCOUNTER — Ambulatory Visit (INDEPENDENT_AMBULATORY_CARE_PROVIDER_SITE_OTHER)
Admission: RE | Admit: 2015-01-08 | Discharge: 2015-01-08 | Disposition: A | Discharge status: Home / Self Care | Payer: Medicare Other | Source: Ambulatory Visit | Attending: Family | Admitting: Family

## 2015-01-08 ENCOUNTER — Ambulatory Visit (HOSPITAL_COMMUNITY)
Admission: RE | Admit: 2015-01-08 | Discharge: 2015-01-08 | Disposition: A | Discharge status: Home / Self Care | Payer: Medicare Other | Source: Ambulatory Visit | Attending: Family | Admitting: Family

## 2015-01-08 ENCOUNTER — Encounter: Payer: Self-pay | Admitting: Family

## 2015-01-08 VITALS — BP 142/70 | HR 70 | Resp 16 | Ht 62.0 in | Wt 342.0 lb

## 2015-01-08 DIAGNOSIS — E1151 Type 2 diabetes mellitus with diabetic peripheral angiopathy without gangrene: Secondary | ICD-10-CM

## 2015-01-08 DIAGNOSIS — Z9889 Other specified postprocedural states: Secondary | ICD-10-CM | POA: Diagnosis not present

## 2015-01-08 DIAGNOSIS — E1159 Type 2 diabetes mellitus with other circulatory complications: Secondary | ICD-10-CM | POA: Insufficient documentation

## 2015-01-08 DIAGNOSIS — IMO0002 Reserved for concepts with insufficient information to code with codable children: Secondary | ICD-10-CM

## 2015-01-08 DIAGNOSIS — I739 Peripheral vascular disease, unspecified: Secondary | ICD-10-CM

## 2015-01-08 DIAGNOSIS — Z48812 Encounter for surgical aftercare following surgery on the circulatory system: Secondary | ICD-10-CM

## 2015-01-08 DIAGNOSIS — E1165 Type 2 diabetes mellitus with hyperglycemia: Secondary | ICD-10-CM

## 2015-01-08 NOTE — Progress Notes (Signed)
[facility] [place] HISTORY AND PHYSICAL -PAD  History of Present Illness Catherine Williams is a 54 y.o. female patient of Dr. Trula Slade who is status post posterior tibial atherectomy and angioplasty on 10/18/2011 in the setting of a nonhealing amputation site (left 4th and 5th toes) which has gone on to heal. She returns today for follow up. She denies claudication in lower extremities with walking, denies non-healing wounds. She does report mild tingling and numbness in toes of both feet that she says is neuropathy.  Pt reports New Medical or Surgical History: she was recently diagnosed with lumber HNP, has right radiculopathy . Admits gaining weight, states she has been able to lose weight in the past by improving her diet.  Pt Diabetic: Yes, states her last A1C was 9.1, uncontrolled Pt smoker: non-smoker  Pt meds include: Statin :Yes Betablocker: Yes ASA: Yes Other anticoagulants/antiplatelets: no   Past Medical History  Diagnosis Date  . Hypertension   . Heart failure   . Asthma   . Diabetes mellitus   . Hyperlipidemia   . Kidney disease   . Pinched nerve   . Morbid obesity   . COPD (chronic obstructive pulmonary disease)   . Sleep apnea   . Peripheral vascular disease   . Arthritis Dec. 2014    Gout  . Anemia   . CHF (congestive heart failure)     Social History History  Substance Use Topics  . Smoking status: Never Smoker   . 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  . Alcohol Use: No    Family History Family History  Problem Relation Age of Onset  . Lung cancer Mother   . Clotting disorder Mother   . Heart disease Mother   . Cancer Mother     Lung  . Deep vein thrombosis Mother   . Diabetes Mother   . Hypertension Mother   . Varicose Veins Mother   . Cancer Father   . Clotting disorder Sister   . Heart attack Sister   . Clotting disorder Brother   . Heart disease Sister   . Heart  disease Brother     Past Surgical History  Procedure Laterality Date  . Ovary surgery      Left  . Hand surgery      right  . Atherectomy and angioplasty  10/18/2011    left posterior tibial artery  . Toe amputation  Sept. 25,2012    Left 4th and 5th toes    Allergies  Allergen Reactions  . Omnipaque [Iohexol] Nausea And Vomiting    Contrast Dye- Effects Kidney's    Current Outpatient Prescriptions  Medication Sig Dispense Refill  . albuterol (PROVENTIL HFA;VENTOLIN HFA) 108 (90 BASE) MCG/ACT inhaler Inhale 1-2 puffs into the lungs every 6 (six) hours as needed for wheezing. 1 Inhaler 0  . amLODipine (NORVASC) 10 MG tablet Take 10 mg by mouth daily.      Marland Kitchen amoxicillin-clavulanate (AUGMENTIN) 875-125 MG per tablet Take 1 tablet by mouth every 12 (twelve) hours. 14 tablet 0  . aspirin 81 MG tablet Take 81 mg by mouth daily.      . budesonide-formoterol (SYMBICORT) 160-4.5 MCG/ACT inhaler Inhale 2 puffs into the lungs 2 (two) times daily. 1 Inhaler 12  . cyclobenzaprine (FLEXERIL) 10 MG tablet Take 10 mg by mouth 3 (three) times daily as needed for muscle spasms.    . furosemide (LASIX) 80 MG  tablet Take 80 mg by mouth 2 (two) times daily.    Marland Kitchen gabapentin (NEURONTIN) 100 MG capsule Take 100 mg by mouth at bedtime.    . hydrALAZINE (APRESOLINE) 25 MG tablet Take 25 mg by mouth daily.     Marland Kitchen HYDROcodone-acetaminophen (NORCO) 10-325 MG per tablet Take 1 tablet by mouth every 6 (six) hours as needed for moderate pain.     Marland Kitchen insulin aspart (NOVOLOG) 100 UNIT/ML injection Inject 20 Units into the skin 3 (three) times daily with meals.     . insulin glargine (LANTUS) 100 UNIT/ML injection Inject 70 Units into the skin at bedtime.     Marland Kitchen ipratropium (ATROVENT) 0.06 % nasal spray Place 2 sprays into both nostrils 4 (four) times daily. (Patient taking differently: Place 2 sprays into both nostrils 4 (four) times daily as needed for rhinitis. ) 15 mL 1  . isosorbide dinitrate (ISORDIL) 30 MG  tablet Take 30 mg by mouth 2 (two) times daily.      Marland Kitchen levofloxacin (LEVAQUIN) 750 MG tablet Take 1 tablet (750 mg total) by mouth daily. X 7 days 7 tablet 0  . metoprolol (LOPRESSOR) 50 MG tablet Take 50 mg by mouth 2 (two) times daily.      . ondansetron (ZOFRAN ODT) 4 MG disintegrating tablet Take 1 tablet (4 mg total) by mouth every 8 (eight) hours as needed for nausea or vomiting. 20 tablet 0  . predniSONE (DELTASONE) 20 MG tablet Take 2 tablets (40 mg total) by mouth daily with breakfast. 2 tabs po daily x 3 days 10 tablet 0  . promethazine (PHENERGAN) 25 MG suppository Place 1 suppository (25 mg total) rectally every 6 (six) hours as needed for nausea or vomiting. 12 each 0  . rosuvastatin (CRESTOR) 40 MG tablet Take 40 mg by mouth every evening.     . traMADol-acetaminophen (ULTRACET) 37.5-325 MG per tablet Take 1 tablet by mouth every 6 (six) hours as needed for pain. 30 tablet 0   No current facility-administered medications for this visit.    ROS: See HPI for pertinent positives and negatives.   Physical Examination  Filed Vitals:   01/08/15 1111  BP: 142/70  Pulse: 70  Resp: 16  Height: 5\' 2"  (1.575 m)  Weight: 342 lb (155.13 kg)  SpO2: 95%   Body mass index is 62.54 kg/(m^2).  General: A&O x 3, morbidly obese female. Gait: using cane, slow and deliberate Eyes: PERRLA Pulmonary: CTAB, without wheezes , rales or rhonchi Cardiac: regular Rhythm, no detected murmur     Carotid Bruits Left Right   Negative Negative   Aorta  Is not palpable  Radial pulses: are 2+ and =    VASCULAR EXAM: Extremities without ischemic changes  without Gangrene; without open wounds. Left 4th and 5th toes amputation site well healed.     LE Pulses LEFT RIGHT   POPLITEAL not palpable  not  palpable   POSTERIOR TIBIAL not palpable  not palpable    DORSALIS PEDIS  ANTERIOR TIBIAL not palpable  not palpable    Abdomen: soft, NT, no palpable masses. Skin: no rashes, no ulcers. Musculoskeletal: no muscle wasting or atrophy. Left 4th and 5th toes are surgically absent. Neurologic: A&O X 3; Appropriate Affect ; SENSATION: normal; MOTOR FUNCTION: moving all extremities equally, motor strength 5/5 in UE's, 3/5 in LE's. Speech is fluent/normal. CN 2-12 intact.    Non-Invasive Vascular Imaging: DATE: 01/08/2015 LOWER EXTREMITY ARTERIAL DUPLEX EVALUATION    INDICATION: PVD  PREVIOUS INTERVENTION(S): Status post atherectomy and angioplasty of the left PTA 10/18/2011    DUPLEX EXAM: Left lower extremity arterial duplex    RIGHT  LEFT   Peak Systolic Velocity (cm/s) Ratio (if abnormal) Waveform  Peak Systolic Velocity (cm/s) Ratio (if abnormal) Waveform     Common Femoral Artery 149  T     Deep Femoral Artery 66  B     Superficial Femoral Artery Proximal 84  T     Superficial Femoral Artery Mid 224  T     Superficial Femoral Artery Distal 100  T     Popliteal Artery 11  T     Posterior Tibial Artery Dist 157  T     Anterior Tibial Artery Distal 144  M     Peroneal Artery Distal 39  M  1.06 Today's ABI / TBI .93  .92 Previous ABI / TBI ( 12/30/13 ) .91    Waveform:    M - Monophasic       B - Biphasic       T - Triphasic  If Ankle Brachial Index (ABI) or Toe Brachial Index (TBI) performed, please see complete report  ADDITIONAL FINDINGS:     IMPRESSION: 1. Technically difficult exam due to body habitus and calcified walls which prevents adequate visualization of arteries. 2. No focal stenosis visualized, limited visualization in the mid to distal femoral artery, increase of velocity in the mid segment of the femoral artery suggests greater than 50% stenosis.    Compared to the previous exam:  No change     ASSESSMENT: Catherine Williams is a 54 y.o. female who is status post posterior tibial atherectomy and angioplasty on 10/18/2011 in the setting of a nonhealing amputation site (left 4th and 5th toes) which has gone on to heal. She also has lumbar HNP, recent weight gain has worsened her radiculopathy. She may not walk enough to elicit claudication symptoms, has no tissue loss in her lower extremities.  Today's left LE arterial Duplex was technically difficult exam due to body habitus and calcified walls which prevents adequate visualization of arteries. No focal stenosis visualized, limited visualization in the mid to distal femoral artery, increase of velocity in the mid segment of the femoral artery suggests greater than 50% stenosis. No change compared to a year ago. ABI's today remain stable and in the normal range with biphasic waveforms. Both TBI's are below normal.  Her atherosclerotic risk factors include uncontrolled DM, morbid obesity, and sedentary state.  Face to face time with patient was 30 minutes. Over 50% of this time was spent on counseling and coordination of care.     PLAN:  Since she has radiculopathy pain with walking and therefore cannot readily participate in a graduated walking program, I encouraged her to find an exercise that she will do on a regular basis, she mentioned walking in a swimming pool.  I discussed in depth with the patient the nature of atherosclerosis, and emphasized the importance of maximal medical management including strict control of blood pressure, blood glucose, and lipid levels, obtaining regular exercise, and continued cessation of smoking.  The patient is aware that without maximal medical management the underlying atherosclerotic disease process will progress, limiting the benefit of any interventions.  Based on the patient's vascular studies and examination, pt will return to clinic in 1 year for ABI's and LLE arterial Duplex.  The patient was given information  about PAD including signs, symptoms, treatment, what symptoms should prompt  the patient to seek immediate medical care, and risk reduction measures to take.  Clemon Chambers, RN, MSN, FNP-C [facility] Phone: [phone]  Clinic MD: [facility]  01/08/2015  11:09 AM

## 2015-01-08 NOTE — Patient Instructions (Addendum)
Peripheral Vascular Disease Peripheral Vascular Disease (PVD), also called Peripheral Arterial Disease (PAD), is a circulation problem caused by cholesterol (atherosclerotic plaque) deposits in the arteries. PVD commonly occurs in the lower extremities (legs) but it can occur in other areas of the body, such as your arms. The cholesterol buildup in the arteries reduces blood flow which can cause pain and other serious problems. The presence of PVD can place a person at risk for Coronary Artery Disease (CAD).  CAUSES  Causes of PVD can be many. It is usually associated with more than one risk factor such as:   High Cholesterol.  Smoking.  Diabetes.  Lack of exercise or inactivity.  High blood pressure (hypertension).  Obesity.  Family history. SYMPTOMS   When the lower extremities are affected, patients with PVD may experience:  Leg pain with exertion or physical activity. This is called INTERMITTENT CLAUDICATION. This may present as cramping or numbness with physical activity. The location of the pain is associated with the level of blockage. For example, blockage at the abdominal level (distal abdominal aorta) may result in buttock or hip pain. Lower leg arterial blockage may result in calf pain.  As PVD becomes more severe, pain can develop with less physical activity.  In people with severe PVD, leg pain may occur at rest.  Other PVD signs and symptoms:  Leg numbness or weakness.  Coldness in the affected leg or foot, especially when compared to the other leg.  A change in leg color.  Patients with significant PVD are more prone to ulcers or sores on toes, feet or legs. These may take longer to heal or may reoccur. The ulcers or sores can become infected.  If signs and symptoms of PVD are ignored, gangrene may occur. This can result in the loss of toes or loss of an entire limb.  Not all leg pain is related to PVD. Other medical conditions can cause leg pain such  as:  Blood clots (embolism) or Deep Vein Thrombosis.  Inflammation of the blood vessels (vasculitis).  Spinal stenosis. DIAGNOSIS  Diagnosis of PVD can involve several different types of tests. These can include:  Pulse Volume Recording Method (PVR). This test is simple, painless and does not involve the use of X-rays. PVR involves measuring and comparing the blood pressure in the arms and legs. An ABI (Ankle-Brachial Index) is calculated. The normal ratio of blood pressures is 1. As this number becomes smaller, it indicates more severe disease.  < 0.95 - indicates significant narrowing in one or more leg vessels.  <0.8 - there will usually be pain in the foot, leg or buttock with exercise.  <0.4 - will usually have pain in the legs at rest.  <0.25 - usually indicates limb threatening PVD.  Doppler detection of pulses in the legs. This test is painless and checks to see if you have a pulses in your legs/feet.  A dye or contrast material (a substance that highlights the blood vessels so they show up on x-ray) may be given to help your caregiver better see the arteries for the following tests. The dye is eliminated from your body by the kidney's. Your caregiver may order blood work to check your kidney function and other laboratory values before the following tests are performed:  Magnetic Resonance Angiography (MRA). An MRA is a picture study of the blood vessels and arteries. The MRA machine uses a large magnet to produce images of the blood vessels.  Computed Tomography Angiography (CTA). A CTA   is a specialized x-ray that looks at how the blood flows in your blood vessels. An IV may be inserted into your arm so contrast dye can be injected.  Angiogram. Is a procedure that uses x-rays to look at your blood vessels. This procedure is minimally invasive, meaning a small incision (cut) is made in your groin. A small tube (catheter) is then inserted into the artery of your groin. The catheter  is guided to the blood vessel or artery your caregiver wants to examine. Contrast dye is injected into the catheter. X-rays are then taken of the blood vessel or artery. After the images are obtained, the catheter is taken out. TREATMENT  Treatment of PVD involves many interventions which may include:  Lifestyle changes:  Quitting smoking.  Exercise.  Following a low fat, low cholesterol diet.  Control of diabetes.  Foot care is very important to the PVD patient. Good foot care can help prevent infection.  Medication:  Cholesterol-lowering medicine.  Blood pressure medicine.  Anti-platelet drugs.  Certain medicines may reduce symptoms of Intermittent Claudication.  Interventional/Surgical options:  Angioplasty. An Angioplasty is a procedure that inflates a balloon in the blocked artery. This opens the blocked artery to improve blood flow.  Stent Implant. A wire mesh tube (stent) is placed in the artery. The stent expands and stays in place, allowing the artery to remain open.  Peripheral Bypass Surgery. This is a surgical procedure that reroutes the blood around a blocked artery to help improve blood flow. This type of procedure may be performed if Angioplasty or stent implants are not an option. SEEK IMMEDIATE MEDICAL CARE IF:   You develop pain or numbness in your arms or legs.  Your arm or leg turns cold, becomes blue in color.  You develop redness, warmth, swelling and pain in your arms or legs. MAKE SURE YOU:   Understand these instructions.  Will watch your condition.  Will get help right away if you are not doing well or get worse. Document Released: 01/12/2005 Document Revised: 02/27/2012 Document Reviewed: 12/09/2008 [facility] Patient Information 2015 [place], [place]. This information is not intended to replace advice given to you by your health care provider. Make sure you discuss any questions you have with your health care provider. Prevencin del  ictus (Stroke Prevention) Algunos problemas de salud y ciertas conductas favorecen el ictus. A continuacin se indican algunas formas de disminuir el riesgo de tener un ictus.   Haga alguna actividad fsica al menos durante 30 minutos todos [place].  No fume. Trate de no rodearse de Geophysicist/field seismologist.  No beba alcohol en exceso.  No beba ms [place] si es hombre.  No beba ms de una copa al da si es mujer y no est embarazada.  Consuma alimentos saludables, como frutas y verduras. Si le indican una dieta especfica, sgala estrictamente.  Mantenga sus niveles de colesterol bajo control con la dieta y medicamentos. Consuma alimentos bajos en grasas saturadas, grasas trans, colesterol y ricos en fibra.  Si tiene diabetes, siga todos los planes dietticos y tome los medicamentos como se le indique.  Si usted tiene la presin arterial alta (hipertensin), siga todos los planes dietticos y tome los medicamentos segn las indicaciones.  Mantenga un peso saludable. Consuma alimentos bajos en caloras, sal, grasas saturadas, grasas trans y colesterol.  No consuma drogas.  Evite las pldoras anticonceptivas, si corresponde. Hable con su mdico acerca de los riesgos de tomar pldoras anticonceptivas.  Hable con su  mdico si usted tiene problemas de sueo (apnea del sueo).  Tome todos los medicamentos segn las indicaciones de su mdico.  Es posible que le indiquen que tome aspirina o anticoagulantes. Tome los medicamentos como le indic el mdico.  Conozca todas las instrucciones de los medicamentos.  Asegrese de tener bajo control cualquier otra enfermedad que tenga. SOLICITE AYUDA DE INMEDIATO SI:  Pierde repentinamente la sensibilidad (siente adormecimiento) o siente debilidad en el rostro, un brazo o una pierna.  Su cara o prpado se caen hacia un lado.  Se siente sbitamente confundido.  Tiene dificultad para hablar afasia o comprender lo que las [employer].  Presenta dificultad para la visin de uno o ambos ojos.  Tiene dificultad repentina para caminar.  Tiene mareos.  Pierde el equilibrio o sus movimientos son torpes (falta de coordinacin).  Siente un dolor de cabeza sbito e intenso y no sabe la causa.  Siente un dolor nuevo en el pecho.  Siente un aleteo en el corazn o le falta un latido (ritmo cardaco irregular). No espere para ver si los sntomas desaparecen. Solicite ayuda de inmediato. Comunquese con el servicio de emergencias de su localidad (911 en los [place]). No conduzca por sus propios medios [employer] hospital. Document Released: 06/05/2012 Document Revised: 09/25/2013 [employer] Patient Information 2015 [place], [place]. This information is not intended to replace advice given to you by your health care provider. Make sure you discuss any questions you have with your health care provider.

## 2015-01-14 ENCOUNTER — Emergency Department (HOSPITAL_COMMUNITY)
Admission: EM | Admit: 2015-01-14 | Discharge: 2015-01-14 | Disposition: A | Discharge status: Home / Self Care | Payer: Medicare Other | Attending: Emergency Medicine | Admitting: Emergency Medicine

## 2015-01-14 ENCOUNTER — Emergency Department (INDEPENDENT_AMBULATORY_CARE_PROVIDER_SITE_OTHER)
Admission: EM | Admit: 2015-01-14 | Discharge: 2015-01-14 | Disposition: A | Discharge status: Nursing Home (Includes ICF) | Payer: Medicare Other | Source: Home / Self Care | Attending: Family Medicine | Admitting: Family Medicine

## 2015-01-14 ENCOUNTER — Encounter (HOSPITAL_COMMUNITY): Payer: Self-pay | Admitting: Emergency Medicine

## 2015-01-14 ENCOUNTER — Encounter (HOSPITAL_COMMUNITY): Payer: Self-pay | Admitting: *Deleted

## 2015-01-14 ENCOUNTER — Emergency Department (HOSPITAL_COMMUNITY): Payer: Medicare Other

## 2015-01-14 DIAGNOSIS — Z8669 Personal history of other diseases of the nervous system and sense organs: Secondary | ICD-10-CM | POA: Diagnosis not present

## 2015-01-14 DIAGNOSIS — I10 Essential (primary) hypertension: Secondary | ICD-10-CM | POA: Insufficient documentation

## 2015-01-14 DIAGNOSIS — Z7952 Long term (current) use of systemic steroids: Secondary | ICD-10-CM | POA: Insufficient documentation

## 2015-01-14 DIAGNOSIS — K219 Gastro-esophageal reflux disease without esophagitis: Secondary | ICD-10-CM | POA: Diagnosis not present

## 2015-01-14 DIAGNOSIS — E119 Type 2 diabetes mellitus without complications: Secondary | ICD-10-CM | POA: Insufficient documentation

## 2015-01-14 DIAGNOSIS — Z87891 Personal history of nicotine dependence: Secondary | ICD-10-CM | POA: Diagnosis not present

## 2015-01-14 DIAGNOSIS — Z862 Personal history of diseases of the blood and blood-forming organs and certain disorders involving the immune mechanism: Secondary | ICD-10-CM | POA: Diagnosis not present

## 2015-01-14 DIAGNOSIS — E785 Hyperlipidemia, unspecified: Secondary | ICD-10-CM | POA: Diagnosis not present

## 2015-01-14 DIAGNOSIS — M109 Gout, unspecified: Secondary | ICD-10-CM | POA: Insufficient documentation

## 2015-01-14 DIAGNOSIS — R9431 Abnormal electrocardiogram [ECG] [EKG]: Secondary | ICD-10-CM | POA: Diagnosis not present

## 2015-01-14 DIAGNOSIS — Z794 Long term (current) use of insulin: Secondary | ICD-10-CM | POA: Insufficient documentation

## 2015-01-14 DIAGNOSIS — Z7951 Long term (current) use of inhaled steroids: Secondary | ICD-10-CM | POA: Insufficient documentation

## 2015-01-14 DIAGNOSIS — Z8742 Personal history of other diseases of the female genital tract: Secondary | ICD-10-CM | POA: Diagnosis not present

## 2015-01-14 DIAGNOSIS — J441 Chronic obstructive pulmonary disease with (acute) exacerbation: Secondary | ICD-10-CM | POA: Insufficient documentation

## 2015-01-14 DIAGNOSIS — Z79899 Other long term (current) drug therapy: Secondary | ICD-10-CM | POA: Diagnosis not present

## 2015-01-14 DIAGNOSIS — I509 Heart failure, unspecified: Secondary | ICD-10-CM | POA: Diagnosis not present

## 2015-01-14 DIAGNOSIS — R1013 Epigastric pain: Secondary | ICD-10-CM

## 2015-01-14 DIAGNOSIS — Z7982 Long term (current) use of aspirin: Secondary | ICD-10-CM | POA: Insufficient documentation

## 2015-01-14 DIAGNOSIS — R079 Chest pain, unspecified: Secondary | ICD-10-CM | POA: Diagnosis not present

## 2015-01-14 LAB — CBC WITH DIFFERENTIAL/PLATELET
BASOS PCT: 0 % (ref 0–1)
Basophils Absolute: 0 10*3/uL (ref 0.0–0.1)
EOS ABS: 0.2 10*3/uL (ref 0.0–0.7)
Eosinophils Relative: 2 % (ref 0–5)
HCT: 36.4 % (ref 36.0–46.0)
HEMOGLOBIN: 10.9 g/dL — AB (ref 12.0–15.0)
LYMPHS ABS: 2.4 10*3/uL (ref 0.7–4.0)
Lymphocytes Relative: 25 % (ref 12–46)
MCH: 24.8 pg — ABNORMAL LOW (ref 26.0–34.0)
MCHC: 29.9 g/dL — ABNORMAL LOW (ref 30.0–36.0)
MCV: 82.9 fL (ref 78.0–100.0)
MONO ABS: 0.4 10*3/uL (ref 0.1–1.0)
Monocytes Relative: 4 % (ref 3–12)
NEUTROS PCT: 69 % (ref 43–77)
Neutro Abs: 6.5 10*3/uL (ref 1.7–7.7)
Platelets: 277 10*3/uL (ref 150–400)
RBC: 4.39 MIL/uL (ref 3.87–5.11)
RDW: 16.9 % — ABNORMAL HIGH (ref 11.5–15.5)
WBC: 9.5 10*3/uL (ref 4.0–10.5)

## 2015-01-14 LAB — COMPREHENSIVE METABOLIC PANEL
ALT: 16 U/L (ref 0–35)
AST: 15 U/L (ref 0–37)
Albumin: 3.4 g/dL — ABNORMAL LOW (ref 3.5–5.2)
Alkaline Phosphatase: 80 U/L (ref 39–117)
Anion gap: 9 (ref 5–15)
BUN: 30 mg/dL — ABNORMAL HIGH (ref 6–23)
CO2: 28 mmol/L (ref 19–32)
CREATININE: 2.09 mg/dL — AB (ref 0.50–1.10)
Calcium: 9.9 mg/dL (ref 8.4–10.5)
Chloride: 106 mmol/L (ref 96–112)
GFR calc Af Amer: 29 mL/min — ABNORMAL LOW (ref >90)
GFR calc non Af Amer: 25 mL/min — ABNORMAL LOW (ref >90)
GLUCOSE: 144 mg/dL — AB (ref 70–99)
Potassium: 4.1 mmol/L (ref 3.5–5.1)
Sodium: 143 mmol/L (ref 135–145)
Total Bilirubin: 0.5 mg/dL (ref 0.3–1.2)
Total Protein: 7.4 g/dL (ref 6.0–8.3)

## 2015-01-14 LAB — I-STAT TROPONIN, ED
TROPONIN I, POC: 0.01 ng/mL (ref 0.00–0.08)
Troponin i, poc: 0 ng/mL (ref 0.00–0.08)

## 2015-01-14 LAB — BRAIN NATRIURETIC PEPTIDE: B Natriuretic Peptide: 70 pg/mL (ref 0.0–100.0)

## 2015-01-14 LAB — LIPASE, BLOOD: Lipase: 22 U/L (ref 11–59)

## 2015-01-14 LAB — TROPONIN I

## 2015-01-14 MED ORDER — GI COCKTAIL ~~LOC~~: 30.0000 mL | Freq: Once | ORAL | Status: DC

## 2015-01-14 MED ORDER — HYDROMORPHONE HCL 1 MG/ML IJ SOLN
1.0000 mg | Freq: Once | INTRAMUSCULAR | Status: AC
  Administered 2015-01-14: 1 mg via INTRAVENOUS
  Filled 2015-01-14: qty 1

## 2015-01-14 MED ORDER — GI COCKTAIL ~~LOC~~
30.0000 mL | Freq: Once | ORAL | Status: AC
  Administered 2015-01-14: 30 mL via ORAL
  Filled 2015-01-14: qty 30

## 2015-01-14 NOTE — ED Notes (Signed)
C/o abd pain that radiates to back onset 1 week Sx also include SOB when she lays down and wheezing Hx of GERD; taking OTC meds Tums w/no relief Denies fevers, chills Alert, no signs of acute distress.

## 2015-01-14 NOTE — ED Provider Notes (Signed)
CSN: [id number]     Arrival date & time 01/14/15  1029 History   First MD Initiated Contact with Patient 01/14/15 1030     Chief Complaint  Patient presents with  . Abdominal Pain     (Consider location/radiation/quality/duration/timing/severity/associated sxs/prior Treatment) Patient is a 54 y.o. female presenting with abdominal pain.  Abdominal Pain Pain location:  Epigastric Pain quality: sharp   Pain radiates to:  Back Pain severity:  Moderate Onset quality:  Gradual Duration:  1 week Timing:  Constant Progression:  Worsening Chronicity:  New Context: previous surgery (tubal ligation)   Relieved by:  Nothing Worsened by:  Nothing tried Ineffective treatments:  None tried Associated symptoms: anorexia, diarrhea (last week, now resolved), fever and shortness of breath   Associated symptoms: no chills, no cough, no dysuria, no nausea and no vomiting     Past Medical History  Diagnosis Date  . Hypertension   . Heart failure   . Asthma   . Diabetes mellitus   . Hyperlipidemia   . Kidney disease   . Pinched nerve   . Morbid obesity   . COPD (chronic obstructive pulmonary disease)   . Sleep apnea   . Peripheral vascular disease   . Arthritis Dec. 2014    Gout  . Anemia   . CHF (congestive heart failure)    Past Surgical History  Procedure Laterality Date  . Ovary surgery      Left  . Hand surgery      right  . Atherectomy and angioplasty  10/18/2011    left posterior tibial artery  . Toe amputation  Sept. 25,2012    Left 4th and 5th toes   Family History  Problem Relation Age of Onset  . Lung cancer Mother   . Clotting disorder Mother   . Heart disease Mother   . Cancer Mother     Lung  . Deep vein thrombosis Mother   . Diabetes Mother   . Hypertension Mother   . Varicose Veins Mother   . Cancer Father   . Clotting disorder Sister   . Heart attack Sister   . Diabetes Sister   . Clotting disorder Brother   . Deep vein thrombosis Brother   .  Diabetes Brother   . Heart disease Sister   . Heart disease Brother    History  Substance Use Topics  . Smoking status: Former Smoker    Types: Cigarettes  . 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  . Alcohol Use: No   OB History    No data available     Review of Systems  Constitutional: Positive for fever. Negative for chills.  Respiratory: Positive for shortness of breath. Negative for cough.   Gastrointestinal: Positive for abdominal pain, diarrhea (last week, now resolved) and anorexia. Negative for nausea and vomiting.  Genitourinary: Negative for dysuria.  All other systems reviewed and are negative.     Allergies  Omnipaque  Home Medications   Prior to Admission medications   Medication Sig Start Date End Date Taking? Authorizing Provider  albuterol (PROVENTIL HFA;VENTOLIN HFA) 108 (90 BASE) MCG/ACT inhaler Inhale 1-2 puffs into the lungs every 6 (six) hours as needed for wheezing. 07/25/13   Harden Mo, MD  amLODipine (NORVASC) 10 MG tablet Take 10 mg by mouth daily.      Historical Provider, MD  amoxicillin-clavulanate (AUGMENTIN) 875-125 MG per tablet Take 1 tablet by mouth every  12 (twelve) hours. 06/12/14   Gregor Hams, MD  aspirin 81 MG tablet Take 81 mg by mouth daily.      Historical Provider, MD  budesonide-formoterol (SYMBICORT) 160-4.5 MCG/ACT inhaler Inhale 2 puffs into the lungs 2 (two) times daily. 07/25/13   Harden Mo, MD  cyclobenzaprine (FLEXERIL) 10 MG tablet Take 10 mg by mouth 3 (three) times daily as needed for muscle spasms.    Historical Provider, MD  furosemide (LASIX) 80 MG tablet Take 80 mg by mouth 2 (two) times daily.    Historical Provider, MD  gabapentin (NEURONTIN) 100 MG capsule Take 100 mg by mouth at bedtime.    Historical Provider, MD  hydrALAZINE (APRESOLINE) 25 MG tablet Take 25 mg by mouth daily.     Historical Provider, MD  HYDROcodone-acetaminophen (NORCO) 10-325 MG per tablet  Take 1 tablet by mouth every 6 (six) hours as needed for moderate pain.     Historical Provider, MD  insulin aspart (NOVOLOG) 100 UNIT/ML injection Inject 20 Units into the skin 3 (three) times daily with meals.     Historical Provider, MD  insulin glargine (LANTUS) 100 UNIT/ML injection Inject 70 Units into the skin at bedtime.     Historical Provider, MD  ipratropium (ATROVENT) 0.06 % nasal spray Place 2 sprays into both nostrils 4 (four) times daily. Patient taking differently: Place 2 sprays into both nostrils 4 (four) times daily as needed for rhinitis.  06/12/14   Gregor Hams, MD  isosorbide dinitrate (ISORDIL) 30 MG tablet Take 30 mg by mouth 2 (two) times daily.      Historical Provider, MD  levofloxacin (LEVAQUIN) 750 MG tablet Take 1 tablet (750 mg total) by mouth daily. X 7 days 11/22/14   Merrily Pew, MD  metoprolol (LOPRESSOR) 50 MG tablet Take 50 mg by mouth 2 (two) times daily.      Historical Provider, MD  ondansetron (ZOFRAN ODT) 4 MG disintegrating tablet Take 1 tablet (4 mg total) by mouth every 8 (eight) hours as needed for nausea or vomiting. 12/12/13   Harvie Heck, PA-C  predniSONE (DELTASONE) 20 MG tablet Take 2 tablets (40 mg total) by mouth daily with breakfast. 2 tabs po daily x 3 days 11/22/14   Merrily Pew, MD  promethazine (PHENERGAN) 25 MG suppository Place 1 suppository (25 mg total) rectally every 6 (six) hours as needed for nausea or vomiting. 12/12/13   Harvie Heck, PA-C  rosuvastatin (CRESTOR) 40 MG tablet Take 40 mg by mouth every evening.     Historical Provider, MD  traMADol-acetaminophen (ULTRACET) 37.5-325 MG per tablet Take 1 tablet by mouth every 6 (six) hours as needed for pain. 09/20/12   Nicole Pisciotta, PA-C   BP 148/61 mmHg  Pulse 72  Temp(Src) 98.2 F (36.8 C) (Oral)  Resp 21  SpO2 91%  LMP 09/05/2012 Physical Exam  Constitutional: She is oriented to person, place, and time. She appears well-developed and well-nourished.  HENT:  Head:  Normocephalic and atraumatic.  Right Ear: External ear normal.  Left Ear: External ear normal.  Eyes: Conjunctivae and EOM are normal. Pupils are equal, round, and reactive to light.  Neck: Normal range of motion. Neck supple.  Cardiovascular: Normal rate, regular rhythm, normal heart sounds and intact distal pulses.   Pulmonary/Chest: Effort normal and breath sounds normal.  Abdominal: Soft. Bowel sounds are normal. There is tenderness in the right upper quadrant and epigastric area. There is CVA tenderness (R).  Musculoskeletal: Normal range of motion.  Neurological: She is alert and oriented to person, place, and time.  Skin: Skin is warm and dry.  Vitals reviewed.   ED Course  Procedures (including critical care time) Labs Review Labs Reviewed  CBC WITH DIFFERENTIAL/PLATELET - Abnormal; Notable for the following:    Hemoglobin 10.9 (*)    MCH 24.8 (*)    MCHC 29.9 (*)    RDW 16.9 (*)    All other components within normal limits  COMPREHENSIVE METABOLIC PANEL - Abnormal; Notable for the following:    Glucose, Bld 144 (*)    BUN 30 (*)    Creatinine, Ser 2.09 (*)    Albumin 3.4 (*)    GFR calc non Af Amer 25 (*)    GFR calc Af Amer 29 (*)    All other components within normal limits  BRAIN NATRIURETIC PEPTIDE  TROPONIN I  LIPASE, BLOOD  I-STAT TROPOININ, ED  I-STAT TROPOININ, ED    Imaging Review Dg Chest 2 View  01/14/2015   CLINICAL DATA:  Chest pain in the lower sternal region, significantly worsened this morning. The pain wraps around to her back on the right side.  EXAM: CHEST  2 VIEW  COMPARISON:  11/22/2014  FINDINGS: There is mild unchanged interstitial coarsening, likely chronic. There are no airspace opacities. There are no effusions. Hilar, mediastinal and cardiac contours appear unremarkable and unchanged.  IMPRESSION: No active cardiopulmonary disease.   Electronically Signed   By: Andreas Newport M.D.   On: 01/14/2015 11:29     EKG  Interpretation   Date/Time:  Wednesday January 14 2015 10:42:08 EST Ventricular Rate:  69 PR Interval:  148 QRS Duration: 105 QT Interval:  431 QTC Calculation: 462 R Axis:   -63 Text Interpretation:  Sinus rhythm Left anterior fascicular block LVH with  secondary repolarization abnormality Anterior Q waves, possibly due to LVH  No significant change since last tracing Confirmed by Debby Freiberg  [phone]) on 01/14/2015 10:45:08 AM      MDM   Final diagnoses:  Gastroesophageal reflux disease, esophagitis presence not specified    54 y.o. female with pertinent PMH of obesity, COPD, niCHF,DM presents with abd pain as above.  On arrival vitals and physical exam as above.  No abd tenderness elicited on examination.  Pt not hypoxic on my exam, only while sleeping, and this is her baseline (she has O2 at home).  Wu as above unremarkable.  Pt had great relief of symptoms after gi cocktail.  Likely etiology given radiating pain from epigastrium to throat worse with lying flat is GERD.  DC home in stable condition, informed her to fu with her cardiologist as well.  I have reviewed all laboratory and imaging studies if ordered as above  1. Gastroesophageal reflux disease, esophagitis presence not specified         Debby Freiberg, MD 01/14/15 1744

## 2015-01-14 NOTE — ED Notes (Signed)
PT's SpO2 dropped to 88% while sleeping, reports hx sleep apnea.  Pt placed on 2L [place].

## 2015-01-14 NOTE — ED Notes (Signed)
Pt presents via POV with c/o of epigastric pain radiating to her back.  Pt states these symptoms began last week which she thought was her acid reflux.  Pt states symptoms got worse last night.

## 2015-01-14 NOTE — ED Provider Notes (Signed)
Catherine Williams is a 54 y.o. female who presents to Urgent Care today for epigastric abdominal or lower chest pain radiating to her back. Symptoms present for about a week. She also notes some shortness of breath and wheezing. She's had diarrhea last week. No exertional chest pain. No fevers or chills.   Past Medical History  Diagnosis Date  . Hypertension   . Heart failure   . Asthma   . Diabetes mellitus   . Hyperlipidemia   . Kidney disease   . Pinched nerve   . Morbid obesity   . COPD (chronic obstructive pulmonary disease)   . Sleep apnea   . Peripheral vascular disease   . Arthritis Dec. 2014    Gout  . Anemia   . CHF (congestive heart failure)    Past Surgical History  Procedure Laterality Date  . Ovary surgery      Left  . Hand surgery      right  . Atherectomy and angioplasty  10/18/2011    left posterior tibial artery  . Toe amputation  Sept. 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 Smoking status: Never Smoker   . 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  . Alcohol Use: No   ROS as above Medications: No current facility-administered medications for this encounter.   Current Outpatient Prescriptions  Medication Sig Dispense Refill  . albuterol (PROVENTIL HFA;VENTOLIN HFA) 108 (90 BASE) MCG/ACT inhaler Inhale 1-2 puffs into the lungs every 6 (six) hours as needed for wheezing. 1 Inhaler 0  . amLODipine (NORVASC) 10 MG tablet Take 10 mg by mouth daily.      Marland Kitchen amoxicillin-clavulanate (AUGMENTIN) 875-125 MG per tablet Take 1 tablet by mouth every 12 (twelve) hours. 14 tablet 0  . aspirin 81 MG tablet Take 81 mg by mouth daily.      . budesonide-formoterol (SYMBICORT) 160-4.5 MCG/ACT inhaler Inhale 2 puffs into the lungs 2 (two) times daily. 1 Inhaler 12  . cyclobenzaprine (FLEXERIL) 10 MG tablet Take 10 mg by mouth 3 (three) times daily as needed for muscle spasms.    . furosemide (LASIX) 80  MG tablet Take 80 mg by mouth 2 (two) times daily.    Marland Kitchen gabapentin (NEURONTIN) 100 MG capsule Take 100 mg by mouth at bedtime.    . hydrALAZINE (APRESOLINE) 25 MG tablet Take 25 mg by mouth daily.     Marland Kitchen HYDROcodone-acetaminophen (NORCO) 10-325 MG per tablet Take 1 tablet by mouth every 6 (six) hours as needed for moderate pain.     Marland Kitchen insulin aspart (NOVOLOG) 100 UNIT/ML injection Inject 20 Units into the skin 3 (three) times daily with meals.     . insulin glargine (LANTUS) 100 UNIT/ML injection Inject 70 Units into the skin at bedtime.     Marland Kitchen ipratropium (ATROVENT) 0.06 % nasal spray Place 2 sprays into both nostrils 4 (four) times daily. (Patient taking differently: Place 2 sprays into both nostrils 4 (four) times daily as needed for rhinitis. ) 15 mL 1  . isosorbide dinitrate (ISORDIL) 30 MG tablet Take 30 mg by mouth 2 (two) times daily.      Marland Kitchen levofloxacin (LEVAQUIN) 750 MG tablet Take 1 tablet (750 mg total) by mouth daily. X 7 days 7 tablet 0  . metoprolol (LOPRESSOR) 50 MG tablet Take 50 mg by mouth 2 (two) times daily.      Marland Kitchen  ondansetron (ZOFRAN ODT) 4 MG disintegrating tablet Take 1 tablet (4 mg total) by mouth every 8 (eight) hours as needed for nausea or vomiting. 20 tablet 0  . predniSONE (DELTASONE) 20 MG tablet Take 2 tablets (40 mg total) by mouth daily with breakfast. 2 tabs po daily x 3 days 10 tablet 0  . promethazine (PHENERGAN) 25 MG suppository Place 1 suppository (25 mg total) rectally every 6 (six) hours as needed for nausea or vomiting. 12 each 0  . rosuvastatin (CRESTOR) 40 MG tablet Take 40 mg by mouth every evening.     . traMADol-acetaminophen (ULTRACET) 37.5-325 MG per tablet Take 1 tablet by mouth every 6 (six) hours as needed for pain. 30 tablet 0   Allergies  Allergen Reactions  . Omnipaque [Iohexol] Nausea And Vomiting    Contrast Dye- Effects Kidney's     Exam:  BP 155/71 mmHg  Pulse 78  Temp(Src) 99.4 F (37.4 C) (Oral)  Resp 28  SpO2 97% Gen: Well NAD  morbidly obese HEENT: EOMI,  MMM Lungs: Normal work of breathing. CTABL Heart: RRR no MRG Abd: Obese NABS. Tender palpation epigastric area. No rebound or guarding. Exts: 2+ pitting edema present bilaterally  No results found for this or any previous visit (from the past 24 hour(s)). No results found.  Assessment and Plan: 54 y.o. female with abdominal pain or chest pain. Unclear etiology. Transfer to ED for further evaluation and management via shuttle.  Discussed warning signs or symptoms. Please see discharge instructions. Patient expresses understanding.     Gregor Hams, MD 01/14/15 1010

## 2015-01-14 NOTE — ED Notes (Signed)
Patient was given a diet gingerale.

## 2015-01-14 NOTE — ED Notes (Signed)
Patient transported to x-ray. ?

## 2015-01-14 NOTE — Discharge Instructions (Signed)
Gastroesophageal Reflux Disease, Adult Gastroesophageal reflux disease (GERD) happens when acid from your stomach flows up into the esophagus. When acid comes in contact with the esophagus, the acid causes soreness (inflammation) in the esophagus. Over time, GERD may create small holes (ulcers) in the lining of the esophagus. CAUSES   Increased body weight. This puts pressure on the stomach, making acid rise from the stomach into the esophagus.  Smoking. This increases acid production in the stomach.  Drinking alcohol. This causes decreased pressure in the lower esophageal sphincter (valve or ring of muscle between the esophagus and stomach), allowing acid from the stomach into the esophagus.  Late evening meals and a full stomach. This increases pressure and acid production in the stomach.  A malformed lower esophageal sphincter. Sometimes, no cause is found. SYMPTOMS   Burning pain in the lower part of the mid-chest behind the breastbone and in the mid-stomach area. This may occur twice a week or more often.  Trouble swallowing.  Sore throat.  Dry cough.  Asthma-like symptoms including chest tightness, shortness of breath, or wheezing. DIAGNOSIS  Your caregiver may be able to diagnose GERD based on your symptoms. In some cases, X-rays and other tests may be done to check for complications or to check the condition of your stomach and esophagus. TREATMENT  Your caregiver may recommend over-the-counter or prescription medicines to help decrease acid production. Ask your caregiver before starting or adding any new medicines.  HOME CARE INSTRUCTIONS   Change the factors that you can control. Ask your caregiver for guidance concerning weight loss, quitting smoking, and alcohol consumption.  Avoid foods and drinks that make your symptoms worse, such as:  Caffeine or alcoholic drinks.  Chocolate.  Peppermint or mint flavorings.  Garlic and onions.  Spicy foods.  Citrus fruits,  such as oranges, lemons, or limes.  Tomato-based foods such as sauce, chili, salsa, and pizza.  Fried and fatty foods.  Avoid lying down for the 3 hours prior to your bedtime or prior to taking a nap.  Eat small, frequent meals instead of large meals.  Wear loose-fitting clothing. Do not wear anything tight around your waist that causes pressure on your stomach.  Raise the head of your bed 6 to 8 inches with wood blocks to help you sleep. Extra pillows will not help.  Only take over-the-counter or prescription medicines for pain, discomfort, or fever as directed by your caregiver.  Do not take aspirin, ibuprofen, or other nonsteroidal anti-inflammatory drugs (NSAIDs). SEEK IMMEDIATE MEDICAL CARE IF:   You have pain in your arms, neck, jaw, teeth, or back.  Your pain increases or changes in intensity or duration.  You develop nausea, vomiting, or sweating (diaphoresis).  You develop shortness of breath, or you faint.  Your vomit is green, yellow, black, or looks like coffee grounds or blood.  Your stool is red, bloody, or black. These symptoms could be signs of other problems, such as heart disease, gastric bleeding, or esophageal bleeding. MAKE SURE YOU:   Understand these instructions.  Will watch your condition.  Will get help right away if you are not doing well or get worse. Document Released: 09/14/2005 Document Revised: 02/27/2012 Document Reviewed: 06/24/2011 ExitCare Patient Information 2015 [employer]. This information is not intended to replace advice given to you by your health care provider. Make sure you discuss any questions you have with your health care provider.  

## 2015-01-27 DIAGNOSIS — E1140 Type 2 diabetes mellitus with diabetic neuropathy, unspecified: Secondary | ICD-10-CM | POA: Diagnosis not present

## 2015-01-27 DIAGNOSIS — E784 Other hyperlipidemia: Secondary | ICD-10-CM | POA: Diagnosis not present

## 2015-01-27 DIAGNOSIS — N184 Chronic kidney disease, stage 4 (severe): Secondary | ICD-10-CM | POA: Diagnosis not present

## 2015-01-27 DIAGNOSIS — M1A9XX0 Chronic gout, unspecified, without tophus (tophi): Secondary | ICD-10-CM | POA: Diagnosis not present

## 2015-01-27 DIAGNOSIS — I10 Essential (primary) hypertension: Secondary | ICD-10-CM | POA: Diagnosis not present

## 2015-01-29 ENCOUNTER — Ambulatory Visit: Payer: Medicare Other | Attending: Specialist | Admitting: Physical Therapy

## 2015-01-29 ENCOUNTER — Telehealth: Payer: Self-pay | Admitting: *Deleted

## 2015-01-29 DIAGNOSIS — M545 Low back pain, unspecified: Secondary | ICD-10-CM

## 2015-01-29 DIAGNOSIS — R5381 Other malaise: Secondary | ICD-10-CM | POA: Diagnosis not present

## 2015-01-29 DIAGNOSIS — R29898 Other symptoms and signs involving the musculoskeletal system: Secondary | ICD-10-CM | POA: Diagnosis not present

## 2015-01-29 NOTE — Therapy (Signed)
[place], [place], [address] Phone: [phone]   Fax:  [phone]  Physical Therapy Evaluation  Patient Details  Name: Catherine Williams MRN: [id number] Date of Birth: [birth year] Referring Provider:  Jessy Oto, MD  Encounter Date: 01/29/2015      PT End of Session - 01/29/15 1649    Visit Number 1   Number of Visits 16   Date for PT Re-Evaluation 03/30/15   PT Start Time [id number]   PT Stop Time [id number]   PT Time Calculation (min) 26 min   Activity Tolerance Patient tolerated treatment well   Behavior During Therapy [facility] for tasks assessed/performed      Past Medical History  Diagnosis Date  . Hypertension   . Heart failure   . Asthma   . Diabetes mellitus   . Hyperlipidemia   . Kidney disease   . Pinched nerve   . Morbid obesity   . COPD (chronic obstructive pulmonary disease)   . Sleep apnea   . Peripheral vascular disease   . Arthritis Dec. 2014    Gout  . Anemia   . CHF (congestive heart failure)     Past Surgical History  Procedure Laterality Date  . Ovary surgery      Left  . Hand surgery      right  . Atherectomy and angioplasty  10/18/2011    left posterior tibial artery  . Toe amputation  Sept. 25,2012    Left 4th and 5th toes    LMP 09/05/2012  Visit Diagnosis:  Weakness of both lower extremities - Plan: PT plan of care cert/re-cert  Midline low back pain without sciatica - Plan: PT plan of care cert/re-cert  Debility - Plan: PT plan of care cert/re-cert      Subjective Assessment - 01/29/15 1558    Symptoms Pt is a 54 y/o female who presents to OPPT for low back pain x 25 years. Pt reports difficulty with lower extremity weakness and difficulties with ADLs.   Pertinent History HTN, DM, morbid obesity   Limitations Standing;Walking;House hold activities   How long can you stand comfortably? 5-10 min   How long can you walk comfortably? 10 min; in home only    Diagnostic tests MRI: stenosis   Patient Stated Goals stand longer to cook, clean; walk longer for exercise   Currently in Pain? Yes   Pain Score 5    Pain Location Back   Pain Orientation Mid;Lower   Pain Descriptors / Indicators Pressure   Pain Type Chronic pain   Pain Onset More than a month ago   Pain Frequency Constant   Aggravating Factors  standing, walking, bending, lying down   Pain Relieving Factors meds, heat          [facility] PT Assessment - 01/29/15 1602    Assessment   Medical Diagnosis spinal stenosis   Onset Date --  1990   Next MD Visit 3 months   Prior Therapy eval only   Precautions   Precautions None   Restrictions   Weight Bearing Restrictions No   Balance Screen   Has the patient fallen in the past 6 months No   Has the patient had a decrease in activity level because of a fear of falling?  No   Is the patient reluctant to leave their home because of a fear of falling?  Yes   [place] Private residence   Living  Arrangements Children   Available Help at Discharge Family   Type of [place] to enter   Entrance Stairs-Number of Steps 4   Entrance Stairs-Rails Can reach both   [place] Two level;Bed/bath upstairs   Alternate Level Stairs-Number of Steps 14   Alternate Level Stairs-Rails Right   [place] - single point;Shower seat   Prior Function   Level of Independence Independent with basic ADLs;Independent with transfers;Independent with gait   Vocation On disability   Leisure church   Cognition   Overall Cognitive Status Within Functional Limits for tasks assessed   Observation/Other Assessments   Focus on Therapeutic Outcomes (FOTO)  47 (53% limited; predicted 42% limited)   Posture/Postural Control   Posture/Postural Control Postural limitations   Postural Limitations Rounded Shoulders;Increased lumbar lordosis   AROM   Lumbar Flexion 27   Lumbar Extension n/t due to     Lumbar - Right Side Bend 14   Lumbar - Left Side Bend 10   Strength   Right Hip Flexion 3+/5   Right Hip Extension --  difficulty bridging   Right Hip ABduction 3/5   Right Hip ADduction 3/5   Left Hip Flexion 3+/5   Left Hip Extension --  difficulty bridging   Left Hip ABduction 3/5   Left Hip ADduction 3/5   Right Knee Flexion 3/5   Right Knee Extension 3+/5   Left Knee Flexion 3/5   Left Knee Extension 3+/5   Palpation   Palpation pt tender to palpation along lumbar and sacral spine bil; tight hamstrings bil                            PT Short Term Goals - 01/29/15 1657    PT SHORT TERM GOAL #1   Title independent with HEP (02/26/15)   Time 4   Period Weeks   Status New   PT SHORT TERM GOAL #2   Title report pain < 4/10 for improved function (02/26/15)   Time 4   Period Weeks   Status New   PT SHORT TERM GOAL #3   Title tolerate standing at least 10 min for improved function with ADLs without increase in pain (02/26/15)   Time 4   Period Weeks   Status New           PT Long Term Goals - 01/29/15 1658    PT LONG TERM GOAL #1   Title verbalize understanding of posture/body mechanics to reduce risk of reinjury (03/26/15)   Time 8   Period Weeks   Status New   PT LONG TERM GOAL #2   Title report pain < 3/10 with activity for improved function (03/26/15)   Time 8   Period Weeks   Status New   PT LONG TERM GOAL #3   Title report ability to cook standing x 15 min without increase in pain (03/26/15)   Time 8   Period Weeks   Status New   PT LONG TERM GOAL #4   Title initiate walking program for home and pt to report compliance at least 3 days per week (03/26/15)   Time 8   Period Weeks   Status New               Plan - 01/29/15 1649    Clinical Impression Statement Pt presents to OPPT with decreased strength, mobility and flexibility as well as low back  pain.  Will benefit from PT to maximize function and mobility.  Will plan to focus on  HEP and community fitness to improve mobility and assist with weight loss.   Pt will benefit from skilled therapeutic intervention in order to improve on the following deficits Decreased activity tolerance;Decreased strength;Decreased mobility;Pain;Obesity;Decreased endurance;Postural dysfunction;Improper body mechanics;Impaired flexibility;Difficulty walking;Decreased range of motion   Rehab Potential Good   PT Frequency 2x / week   PT Duration 8 weeks   PT Treatment/Interventions ADLs/Self Care Home Management;Electrical Stimulation;Functional mobility training;Neuromuscular re-education;Stair training;Cryotherapy;Ultrasound;Manual techniques;Passive range of motion;Therapeutic exercise;Moist Heat;Therapeutic activities;Patient/family education   PT Next Visit Plan HEP for stretching; core stability (basic low back exercises-may need to perform seated if can't tolerate supine)   Consulted and Agree with Plan of Care Patient          G-Codes - 09-Feb-2015 1701    Functional Assessment Tool Used FOTO 53% limited   Functional Limitation Mobility: Walking and moving around   Mobility: Walking and Moving Around Current Status [id number]) At least 40 percent but less than 60 percent impaired, limited or restricted   Mobility: Walking and Moving Around Goal Status [id number]) At least 40 percent but less than 60 percent impaired, limited or restricted       Problem List Patient Active Problem List   Diagnosis Date Noted  . Aftercare following surgery of the circulatory system, [address] 12/30/2013  . Peripheral vascular disease, unspecified 05/07/2012  . Visit for wound check 12/19/2011  . OSA (obstructive sleep apnea) 04/05/2011   Laureen Abrahams, PT, DPT 02-09-2015 5:03 PM  [facility] [address] [place], [place], [address] Phone: [phone]   Fax:  [phone]

## 2015-01-29 NOTE — Telephone Encounter (Signed)
appts made and printed...td 

## 2015-02-05 DIAGNOSIS — N183 Chronic kidney disease, stage 3 (moderate): Secondary | ICD-10-CM | POA: Diagnosis not present

## 2015-02-05 DIAGNOSIS — D509 Iron deficiency anemia, unspecified: Secondary | ICD-10-CM | POA: Diagnosis not present

## 2015-02-06 DIAGNOSIS — N183 Chronic kidney disease, stage 3 (moderate): Secondary | ICD-10-CM | POA: Diagnosis not present

## 2015-02-06 DIAGNOSIS — I129 Hypertensive chronic kidney disease with stage 1 through stage 4 chronic kidney disease, or unspecified chronic kidney disease: Secondary | ICD-10-CM | POA: Diagnosis not present

## 2015-02-06 DIAGNOSIS — D509 Iron deficiency anemia, unspecified: Secondary | ICD-10-CM | POA: Diagnosis not present

## 2015-02-06 DIAGNOSIS — E1129 Type 2 diabetes mellitus with other diabetic kidney complication: Secondary | ICD-10-CM | POA: Diagnosis not present

## 2015-02-09 ENCOUNTER — Ambulatory Visit: Payer: Medicare Other | Admitting: Rehabilitation

## 2015-02-09 ENCOUNTER — Other Ambulatory Visit (HOSPITAL_COMMUNITY): Payer: Self-pay | Admitting: *Deleted

## 2015-02-09 VITALS — HR 88

## 2015-02-09 DIAGNOSIS — R29898 Other symptoms and signs involving the musculoskeletal system: Secondary | ICD-10-CM

## 2015-02-09 DIAGNOSIS — M545 Low back pain, unspecified: Secondary | ICD-10-CM

## 2015-02-09 DIAGNOSIS — R5381 Other malaise: Secondary | ICD-10-CM | POA: Diagnosis not present

## 2015-02-09 NOTE — Therapy (Signed)
[place], [place], [address] Phone: [phone]   Fax:  [phone]  Physical Therapy Treatment  Patient Details  Name: Catherine Williams MRN: [id number] Date of Birth: 03-14-1961 Referring Provider:  Philis Fendt, MD  Encounter Date: 02/09/2015    Past Medical History  Diagnosis Date  . Hypertension   . Heart failure   . Asthma   . Diabetes mellitus   . Hyperlipidemia   . Kidney disease   . Pinched nerve   . Morbid obesity   . COPD (chronic obstructive pulmonary disease)   . Sleep apnea   . Peripheral vascular disease   . Arthritis Dec. 2014    Gout  . Anemia   . CHF (congestive heart failure)     Past Surgical History  Procedure Laterality Date  . Ovary surgery      Left  . Hand surgery      right  . Atherectomy and angioplasty  10/18/2011    left posterior tibial artery  . Toe amputation  Sept. 25,2012    Left 4th and 5th toes    Pulse 88  SpO2 86%  LMP 09/05/2012  Visit Diagnosis:  Weakness of both lower extremities  Midline low back pain without sciatica  Debility      Subjective Assessment - 02/09/15 1424    Symptoms I am short of breath   Diagnostic tests MRI: stenosis   Currently in Pain? Yes   Pain Score 2    Pain Location Back   Pain Orientation Mid;Lower   Pain Descriptors / Indicators Throbbing   Pain Type Chronic pain   Pain Frequency Constant   Aggravating Factors  standing, walking, bending, lying down   Pain Relieving Factors meds, heat                    [facility] Adult PT Treatment/Exercise - 02/09/15 1440    Lumbar Exercises: Stretches   Active Hamstring Stretch 3 reps;30 seconds   Lumbar Exercises: Aerobic   Stationary Bike Nustep L 1 LE Only x 5 min  96%SP02, pt c/o thighs hurting   Lumbar Exercises: Seated   Other Seated Lumbar Exercises seated childs pose using chair forward and lateral 2 x 20 sec each   Lumbar Exercises: Supine    Ab Set 10 reps   Other Supine Lumbar Exercises Butterfly 3 x 30   Other Supine Lumbar Exercises Supine "W" IR 20 sec  x 3  SPO2 down to89% with supine therex, improved with pursed lip                PT Education - 02/09/15 1455    Education provided Yes   Education Details Seated childs pose with chair, hamstring stretch, supine butterfly and W stretch for IR.    Person(s) Educated Patient   Methods Explanation;Handout   Comprehension Verbalized understanding          PT Short Term Goals - 01/29/15 1657    PT SHORT TERM GOAL #1   Title independent with HEP (02/26/15)   Time 4   Period Weeks   Status New   PT SHORT TERM GOAL #2   Title report pain < 4/10 for improved function (02/26/15)   Time 4   Period Weeks   Status New   PT SHORT TERM GOAL #3   Title tolerate standing at least 10 min for improved function with ADLs without increase in pain (02/26/15)   Time 4  Period Weeks   Status New           PT Long Term Goals - 01/29/15 1658    PT LONG TERM GOAL #1   Title verbalize understanding of posture/body mechanics to reduce risk of reinjury (03/26/15)   Time 8   Period Weeks   Status New   PT LONG TERM GOAL #2   Title report pain < 3/10 with activity for improved function (03/26/15)   Time 8   Period Weeks   Status New   PT LONG TERM GOAL #3   Title report ability to cook standing x 15 min without increase in pain (03/26/15)   Time 8   Period Weeks   Status New   PT LONG TERM GOAL #4   Title initiate walking program for home and pt to report compliance at least 3 days per week (03/26/15)   Time 8   Period Weeks   Status New               Plan - 02/09/15 1533    Clinical Impression Statement Pt reports 2 weeks of increased SOB. Encouraged patient to see her cardiologist due to cardiac history. She has an appt in March. Her SPo2% drops with gait, and supine exercises.   PT Next Visit Plan pt to bring oxygen, monitor SPo2. Review HEP, cont nustep         Problem List Patient Active Problem List   Diagnosis Date Noted  . Aftercare following surgery of the circulatory system, [place] 12/30/2013  . Peripheral vascular disease, unspecified 05/07/2012  . Visit for wound check 12/19/2011  . OSA (obstructive sleep apnea) 04/05/2011    Dorene Ar, PTA 02/09/2015, 3:37 PM  [facility] [address] [place], [place], [address] Phone: [phone]   Fax:  [phone]

## 2015-02-09 NOTE — Patient Instructions (Addendum)
HIP: Hamstrings - Short Sitting   Rest leg on raised surface. Keep knee straight. Lift chest. Hold _20-30__ seconds. _3__ reps per set each leg Copyright  VHI. All rights reserved.   Side Twist, Kneeling  PERFORM IN CHAIR Kneel, buttocks on heels. Fold upper body forward from hips. Then reach to each side as far as possible, keeping chest low toward floor. Hold each position _30__ seconds. Repeat __3_ times per session. Do __2_ sessions per day.  Copyright  VHI. All rights reserved.  Butterfly, Supine   Lie on b3ack, feet together. Lower knees toward floor. Hold __30_ seconds. Repeat ___ times per session. Do __2_ sessions per day.  Copyright  VHI. All rights reserved.

## 2015-02-12 ENCOUNTER — Inpatient Hospital Stay (HOSPITAL_COMMUNITY): Admission: RE | Admit: 2015-02-12 | Payer: Medicare Other | Source: Ambulatory Visit

## 2015-02-12 ENCOUNTER — Ambulatory Visit: Payer: Medicare Other | Admitting: Rehabilitation

## 2015-02-12 VITALS — HR 84

## 2015-02-12 DIAGNOSIS — R29898 Other symptoms and signs involving the musculoskeletal system: Secondary | ICD-10-CM

## 2015-02-12 DIAGNOSIS — R5381 Other malaise: Secondary | ICD-10-CM | POA: Diagnosis not present

## 2015-02-12 DIAGNOSIS — M545 Low back pain, unspecified: Secondary | ICD-10-CM

## 2015-02-12 NOTE — Therapy (Signed)
[place], [place], [address] Phone: [phone]   Fax:  [phone]  Physical Therapy Treatment  Patient Details  Name: Catherine Williams MRN: [id number] Date of Birth: 04-Sep-1961 Referring Provider:  Philis Fendt, MD  Encounter Date: 02/12/2015      PT End of Session - 02/12/15 1627    Visit Number 3   Number of Visits 16   Date for PT Re-Evaluation 03/30/15   PT Start Time 0345   PT Stop Time 0430   PT Time Calculation (min) 45 min      Past Medical History  Diagnosis Date  . Hypertension   . Heart failure   . Asthma   . Diabetes mellitus   . Hyperlipidemia   . Kidney disease   . Pinched nerve   . Morbid obesity   . COPD (chronic obstructive pulmonary disease)   . Sleep apnea   . Peripheral vascular disease   . Arthritis Dec. 2014    Gout  . Anemia   . CHF (congestive heart failure)     Past Surgical History  Procedure Laterality Date  . Ovary surgery      Left  . Hand surgery      right  . Atherectomy and angioplasty  10/18/2011    left posterior tibial artery  . Toe amputation  Sept. 25,2012    Left 4th and 5th toes    Pulse 84  SpO2 94%  LMP 09/05/2012  Visit Diagnosis:  Midline low back pain without sciatica  Weakness of both lower extremities  Debility      Subjective Assessment - 02/12/15 1547    Symptoms My shortness of breath has gotten much better. My leg is not swollen today. No pain. I slept on my back for the first time in 2 years last night. My hips were too sore to lay on my sides.    Currently in Pain? No/denies                    [facility] Adult PT Treatment/Exercise - 02/12/15 1554    Lumbar Exercises: Stretches   Active Hamstring Stretch 3 reps;30 seconds  seated with stool   Lumbar Exercises: Aerobic   Stationary Bike Nustep L4 UE/LE x 10 min   Lumbar Exercises: Seated   Other Seated Lumbar Exercises seated childs pose using chair  forward and lateral 2 x 20 sec each   Lumbar Exercises: Supine   Ab Set 10 reps  supine on wedge to elevate head   Clam 15 reps  red band   Heel Slides 10 reps   Other Supine Lumbar Exercises Butterfly 3 x 30   Other Supine Lumbar Exercises Supine "W" IR 20 sec  x 3  96% SPo2                PT Education - 02/12/15 1630    Education provided Yes   Education Details [employer], silver sneakers for aerobic exercise 3 times per week   Person(s) Educated Patient   Methods Explanation   Comprehension Verbalized understanding          PT Short Term Goals - 01/29/15 1657    PT SHORT TERM GOAL #1   Title independent with HEP (02/26/15)   Time 4   Period Weeks   Status New   PT SHORT TERM GOAL #2   Title report pain < 4/10 for improved function (02/26/15)   Time 4  Period Weeks   Status New   PT SHORT TERM GOAL #3   Title tolerate standing at least 10 min for improved function with ADLs without increase in pain (02/26/15)   Time 4   Period Weeks   Status New           PT Long Term Goals - 01/29/15 1658    PT LONG TERM GOAL #1   Title verbalize understanding of posture/body mechanics to reduce risk of reinjury (03/26/15)   Time 8   Period Weeks   Status New   PT LONG TERM GOAL #2   Title report pain < 3/10 with activity for improved function (03/26/15)   Time 8   Period Weeks   Status New   PT LONG TERM GOAL #3   Title report ability to cook standing x 15 min without increase in pain (03/26/15)   Time 8   Period Weeks   Status New   PT LONG TERM GOAL #4   Title initiate walking program for home and pt to report compliance at least 3 days per week (03/26/15)   Time 8   Period Weeks   Status New               Plan - 02/12/15 1549    Clinical Impression Statement Pt reports feeling overall much better. Her pain still increases with 10 minutes of standing activities, requiring rest breaks.    PT Next Visit Plan Monitor HEP, nustep, progress as tolerated  Lspine  ROM and core        Problem List Patient Active Problem List   Diagnosis Date Noted  . Aftercare following surgery of the circulatory system, [place] 12/30/2013  . Peripheral vascular disease, unspecified 05/07/2012  . Visit for wound check 12/19/2011  . OSA (obstructive sleep apnea) 04/05/2011    Dorene Ar, PTA 02/12/2015, 4:31 PM  [place], [place], [address] Phone: [phone]   Fax:  [phone]

## 2015-02-16 ENCOUNTER — Ambulatory Visit: Payer: Medicare Other | Admitting: Physical Therapy

## 2015-02-16 DIAGNOSIS — M545 Low back pain, unspecified: Secondary | ICD-10-CM

## 2015-02-16 DIAGNOSIS — R5381 Other malaise: Secondary | ICD-10-CM

## 2015-02-16 DIAGNOSIS — R29898 Other symptoms and signs involving the musculoskeletal system: Secondary | ICD-10-CM

## 2015-02-16 NOTE — Therapy (Addendum)
[place], [place], [address] Phone: [phone]   Fax:  [phone]  Physical Therapy Treatment  Patient Details  Name: Catherine Williams MRN: [id number] Date of Birth: 1961-05-27 Referring Provider:  Philis Fendt, MD  Encounter Date: 02/16/2015      PT End of Session - 02/16/15 1642    Visit Number 4   Number of Visits 16   Date for PT Re-Evaluation 03/30/15   PT Start Time [id number]   PT Stop Time 1635   PT Time Calculation (min) 40 min   Activity Tolerance Patient tolerated treatment well;Patient limited by pain   Behavior During Therapy [facility] for tasks assessed/performed      Past Medical History  Diagnosis Date  . Hypertension   . Heart failure   . Asthma   . Diabetes mellitus   . Hyperlipidemia   . Kidney disease   . Pinched nerve   . Morbid obesity   . COPD (chronic obstructive pulmonary disease)   . Sleep apnea   . Peripheral vascular disease   . Arthritis Dec. 2014    Gout  . Anemia   . CHF (congestive heart failure)     Past Surgical History  Procedure Laterality Date  . Ovary surgery      Left  . Hand surgery      right  . Atherectomy and angioplasty  10/18/2011    left posterior tibial artery  . Toe amputation  Sept. 25,2012    Left 4th and 5th toes    SpO2 90%  LMP 09/05/2012  Visit Diagnosis:  Midline low back pain without sciatica  Weakness of both lower extremities  Debility      Subjective Assessment - 02/16/15 1558    Symptoms She reports that her back has hurt intermittently and that it is worse following a lot of standing. Otherwise pt reports no changes in symptoms. She states that she plans to start at the [facility] tomorrow.    Currently in Pain? Yes   Pain Score 5    Pain Location Back  mid to low back    Pain Orientation --  mid to low back    Pain Descriptors / Indicators Pressure   Pain Type Chronic pain   Pain Frequency Intermittent           [facility] PT Assessment - 02/16/15 0001    Observation/Other Assessments   Other Surveys  --                  [facility] Adult PT Treatment/Exercise - 02/16/15 1600    Lumbar Exercises: Stretches   Lower Trunk Rotation Other (comment)  10 reps x 2 sets hold for 3 seconds.   Lumbar Exercises: Aerobic   Stationary Bike Nustep L2 UE/LE x 10 min   Lumbar Exercises: Seated   Other Seated Lumbar Exercises seated childs pose using chair forward and lateral 2 x 10 sec each   Other Seated Lumbar Exercises Seated trunk alternating side bending x 15   Lumbar Exercises: Supine   Ab Set 10 reps;5 seconds  supine with wedge to elevate head   Clam 15 reps  red theraband    Other Supine Lumbar Exercises Butterfly 3 x 30   Other Supine Lumbar Exercises Lower extremity trunk rotation    Knee/Hip Exercises: Stretches   Passive Hamstring Stretch 3 reps;30 seconds   Hip Flexor Stretch 3 reps;30 seconds  performed in supine with R leg hanging of  the side of table                PT Education - 02/16/15 1641    Education provided Yes   Education Details quad and hamstring stretches, [employer] aquatic exercise benefits   Person(s) Educated Patient   Methods Explanation   Comprehension Verbalized understanding          PT Short Term Goals - 02/16/15 1659    PT SHORT TERM GOAL #1   Title independent with HEP (02/26/15)   Time 4   Period Weeks   Status On-going   PT SHORT TERM GOAL #2   Title report pain < 4/10 for improved function (02/26/15)   Time 4   Period Weeks   Status On-going   PT SHORT TERM GOAL #3   Title tolerate standing at least 10 min for improved function with ADLs without increase in pain (02/26/15)   Time 4   Period Weeks   Status On-going           PT Long Term Goals - 02/16/15 1659    PT LONG TERM GOAL #1   Title verbalize understanding of posture/body mechanics to reduce risk of reinjury (03/26/15)   Time 8   Period Weeks   Status On-going   PT LONG TERM  GOAL #2   Title report pain < 3/10 with activity for improved function (03/26/15)   Time 8   Period Weeks   Status On-going   PT LONG TERM GOAL #3   Title report ability to cook standing x 15 min without increase in pain (03/26/15)   Time 8   Period Weeks   Status On-going   PT LONG TERM GOAL #4   Title initiate walking program for home and pt to report compliance at least 3 days per week (03/26/15)   Time 8   Period Weeks   Status On-going               Plan - 02/16/15 1643    Clinical Impression Statement Pt has made some improvement but continues to demonstrate increased fatigue during exercise and required multiple breaks to rest.  Following nu step oxygen was assessed at 90% at 96 BPM. She reported some increased pain in her R quad which was allieviated following hip flexor stretches.    PT Next Visit Plan Monitor HEP, Nu step with oxygen monitoring, progress L spine mobility and core strength        Problem List Patient Active Problem List   Diagnosis Date Noted  . Aftercare following surgery of the circulatory system, [place] 12/30/2013  . Peripheral vascular disease, unspecified 05/07/2012  . Visit for wound check 12/19/2011  . OSA (obstructive sleep apnea) 04/05/2011   Starr Lake PT, DPT, LAT, ATC  02/16/2015  5:00 PM  [place], [place], [address] Phone: [phone]   Fax:  [phone]

## 2015-02-17 ENCOUNTER — Encounter (HOSPITAL_COMMUNITY)
Admission: RE | Admit: 2015-02-17 | Discharge: 2015-02-17 | Disposition: A | Discharge status: Home / Self Care | Payer: Medicare Other | Source: Ambulatory Visit | Attending: Nephrology | Admitting: Nephrology

## 2015-02-17 DIAGNOSIS — D509 Iron deficiency anemia, unspecified: Secondary | ICD-10-CM | POA: Diagnosis not present

## 2015-02-17 MED ORDER — SODIUM CHLORIDE 0.9 % IV SOLN
510.0000 mg | Freq: Once | INTRAVENOUS | Status: AC
  Administered 2015-02-17: 510 mg via INTRAVENOUS
  Filled 2015-02-17: qty 17

## 2015-02-17 MED ORDER — SODIUM CHLORIDE 0.9 % IV SOLN: 1020.0000 mg | Freq: Once | INTRAVENOUS | Status: DC

## 2015-02-19 ENCOUNTER — Ambulatory Visit: Payer: Medicare Other | Attending: Specialist | Admitting: Rehabilitation

## 2015-02-19 DIAGNOSIS — M545 Low back pain, unspecified: Secondary | ICD-10-CM

## 2015-02-19 DIAGNOSIS — R5381 Other malaise: Secondary | ICD-10-CM | POA: Diagnosis not present

## 2015-02-19 DIAGNOSIS — R29898 Other symptoms and signs involving the musculoskeletal system: Secondary | ICD-10-CM | POA: Insufficient documentation

## 2015-02-19 NOTE — Therapy (Signed)
[place], [place], [address] Phone: [phone]   Fax:  [phone]  Physical Therapy Treatment  Patient Details  Name: Catherine Williams MRN: [id number] Date of Birth: Jan 16, 1961 Referring Provider:  Philis Fendt, MD  Encounter Date: 02/19/2015      PT End of Session - 02/19/15 1631    Visit Number 5   Number of Visits 16   Date for PT Re-Evaluation 03/30/15   PT Start Time 0345   PT Stop Time 0445   PT Time Calculation (min) 60 min      Past Medical History  Diagnosis Date  . Hypertension   . Heart failure   . Asthma   . Diabetes mellitus   . Hyperlipidemia   . Kidney disease   . Pinched nerve   . Morbid obesity   . COPD (chronic obstructive pulmonary disease)   . Sleep apnea   . Peripheral vascular disease   . Arthritis Dec. 2014    Gout  . Anemia   . CHF (congestive heart failure)     Past Surgical History  Procedure Laterality Date  . Ovary surgery      Left  . Hand surgery      right  . Atherectomy and angioplasty  10/18/2011    left posterior tibial artery  . Toe amputation  Sept. 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 I stood at [employer] and marched in place for 10 minutes wiith my hands on the [employer]. No back pain after just fatigued. Increased pain with standing to make a salad.    Currently in Pain? No/denies                  [facility] Adult PT Treatment/Exercise - 02/19/15 1557    Lumbar Exercises: Stretches   Lower Trunk Rotation --  10 reps, 5 with feet on ball and overpressure   Lumbar Exercises: Aerobic   Stationary Bike Nustep L3 LE only x 10 min   Lumbar Exercises: Standing   Other Standing Lumbar Exercises Ab bracing against wall x 10,also  standing without sway back and drawing in abdominals x 20 feet.     Lumbar Exercises: Seated   Other Seated Lumbar Exercises Seated abdominal bracing with LE marching x 20 ,Seated lumbar flexion touch floor and hold x 10 sec x 30 sec    Other Seated Lumbar Exercises Seated side bend stretch over bolster x 1 minute each side   Lumbar Exercises: Supine   Ab Set 10 reps  supine with wedge to elevate head   Heel Slides 10 reps  with ab set   Modalities   Modalities Moist Heat   Moist Heat Therapy   Number Minutes Moist Heat 15 Minutes   Moist Heat Location --  lumbar with upper body on wedge                  PT Short Term Goals - 02/16/15 1659    PT SHORT TERM GOAL #1   Title independent with HEP (02/26/15)   Time 4   Period Weeks   Status On-going   PT SHORT TERM GOAL #2   Title report pain < 4/10 for improved function (  02/26/15)   Time 4   Period Weeks   Status On-going   PT SHORT TERM GOAL #3   Title tolerate standing at least 10 min for improved function with ADLs without increase in pain (02/26/15)   Time 4   Period Weeks   Status On-going           PT Long Term Goals - 02/16/15 1659    PT LONG TERM GOAL #1   Title verbalize understanding of posture/body mechanics to reduce risk of reinjury (03/26/15)   Time 8   Period Weeks   Status On-going   PT LONG TERM GOAL #2   Title report pain < 3/10 with activity for improved function (03/26/15)   Time 8   Period Weeks   Status On-going   PT LONG TERM GOAL #3   Title report ability to cook standing x 15 min without increase in pain (03/26/15)   Time 8   Period Weeks   Status On-going   PT LONG TERM GOAL #4   Title initiate walking program for home and pt to report compliance at least 3 days per week (03/26/15)   Time 8   Period Weeks   Status On-going               Plan - 02/19/15 1556    Clinical Impression Statement SPO2 walking in clinic 88%, seated rest break and SPO2 returned to 94% and maintained  93-95% or 10 minutes on Nustep with HR of 83. Pain up to 5/10 with therex today, relieved by [place].    PT Next Visit Plan Monitor HEP, Nu step with oxygen monitoring, progress L spine mobility and core strength, continue seated lumbar stretching, HMP as needed        Problem List Patient Active Problem List   Diagnosis Date Noted  . Aftercare following surgery of the circulatory system, [place] 12/30/2013  . Peripheral vascular disease, unspecified 05/07/2012  . Visit for wound check 12/19/2011  . OSA (obstructive sleep apnea) 04/05/2011    Dorene Ar, PTA 02/19/2015, 4:32 PM  [facility] [address] [place], [place], [address] Phone: [phone]   Fax:  [phone]

## 2015-02-24 ENCOUNTER — Ambulatory Visit: Payer: Medicare Other | Admitting: Physical Therapy

## 2015-02-25 ENCOUNTER — Encounter: Payer: Medicare Other | Admitting: Physical Therapy

## 2015-02-27 DIAGNOSIS — I129 Hypertensive chronic kidney disease with stage 1 through stage 4 chronic kidney disease, or unspecified chronic kidney disease: Secondary | ICD-10-CM | POA: Diagnosis not present

## 2015-03-03 ENCOUNTER — Ambulatory Visit: Payer: Medicare Other | Admitting: Rehabilitation

## 2015-03-03 DIAGNOSIS — M545 Low back pain, unspecified: Secondary | ICD-10-CM

## 2015-03-03 DIAGNOSIS — R29898 Other symptoms and signs involving the musculoskeletal system: Secondary | ICD-10-CM

## 2015-03-03 DIAGNOSIS — R5381 Other malaise: Secondary | ICD-10-CM

## 2015-03-03 NOTE — Therapy (Signed)
[place], [place], [address] Phone: [phone]   Fax:  [phone]  Physical Therapy Treatment  Patient Details  Name: Catherine Williams MRN: [id number] Date of Birth: 1961/01/11 Referring Provider:  Nolene Ebbs, MD  Encounter Date: 03/03/2015      PT End of Session - 03/03/15 1337    Visit Number 6   Number of Visits 16   Date for PT Re-Evaluation 03/30/15   PT Start Time 0132   PT Stop Time 0220   PT Time Calculation (min) 48 min      Past Medical History  Diagnosis Date  . Hypertension   . Heart failure   . Asthma   . Diabetes mellitus   . Hyperlipidemia   . Kidney disease   . Pinched nerve   . Morbid obesity   . COPD (chronic obstructive pulmonary disease)   . Sleep apnea   . Peripheral vascular disease   . Arthritis Dec. 2014    Gout  . Anemia   . CHF (congestive heart failure)     Past Surgical History  Procedure Laterality Date  . Ovary surgery      Left  . Hand surgery      right  . Atherectomy and angioplasty  10/18/2011    left posterior tibial artery  . 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  Visit Diagnosis:  Weakness of both lower extremities  Midline low back pain without sciatica  Debility      Subjective Assessment - 03/03/15 1335    Symptoms I have been bending over to pick things up, doing more around the house. I signed up for the gym and am suppose to go this afternoon. I was able to walk around my neighborhood 1 lap. I have been standing 15-20 minutes last 2 days to cook breakfast and did not have to sit down.    Pain Score 2    Aggravating Factors  standing, trying to bend over   Pain Relieving Factors laying on side, sitting            [facility] PT Assessment - 03/03/15 1407    AROM   AROM Assessment Site Lumbar   Lumbar Flexion 35   Lumbar - Right Side Bend 25   Lumbar - Left Side  Bend 25   Strength   Right Hip Flexion 4/5   Right Hip ABduction 4-/5   Left Hip Flexion 4+/5   Left Hip ABduction 4-/5   Right Knee Flexion 4+/5   Right Knee Extension 4/5   Left Knee Flexion 4+/5   Left Knee Extension 4+/5                   [facility] Adult PT Treatment/Exercise - 03/03/15 1338    Lumbar Exercises: Aerobic   Stationary Bike Nustep L4 LE only x 10 min   Lumbar Exercises: Seated   Other Seated Lumbar Exercises Sit-stand x 10 without UE support.   cues for abdominal bracing- less pain    Knee/Hip Exercises: Sidelying   Hip ABduction Both;10 reps                PT Education - 03/03/15 1432    Education provided Yes   Education Details Self Care: Posture and Body mechanics handout discussed and demonstrated with pt. Also issed HEP for Hip abduction in S/L and  bridges.    Person(s) Educated Patient   Methods Explanation;Demonstration;Handout   Comprehension Verbalized understanding          PT Short Term Goals - 03/03/15 1339    PT SHORT TERM GOAL #1   Title independent with HEP (02/26/15)   Time 4   Period Weeks   Status Achieved   PT SHORT TERM GOAL #2   Title report pain < 4/10 for improved function (02/26/15)   Time 4   Period Weeks   Status Achieved   PT SHORT TERM GOAL #3   Title tolerate standing at least 10 min for improved function with ADLs without increase in pain (02/26/15)   Time 4   Period Weeks   Status Achieved           PT Long Term Goals - 03/03/15 1425    PT LONG TERM GOAL #1   Title verbalize understanding of posture/body mechanics to reduce risk of reinjury (03/26/15)   Time 8   Period Weeks   Status Achieved   PT LONG TERM GOAL #2   Title report pain < 3/10 with activity for improved function (03/26/15)   Time 8   Period Weeks   Status On-going   PT LONG TERM GOAL #3   Title report ability to cook standing x 15 min without increase in pain (03/26/15)   Time 8   Period Weeks   Status Achieved   PT LONG TERM  GOAL #4   Title initiate walking program for home and pt to report compliance at least 3 days per week (03/26/15)   Time 8   Period Weeks   Status Partially Met               Plan - 03/03/15 1339    Clinical Impression Statement Standing tolerance improved to 15 to 20 minutes without increased LBP. Pt has initiated walking program and has joined the gym. Lumbar AROM improved and LE strength improved. Continued hip abduction and extension weakness. HEP given to address deficits.    PT Next Visit Plan  continue L spine mobility and core strength, continue seated lumbar stretching, hip strengthening, HMP as needed        Problem List Patient Active Problem List   Diagnosis Date Noted  . Aftercare following surgery of the circulatory system, [place] 12/30/2013  . Peripheral vascular disease, unspecified 05/07/2012  . Visit for wound check 12/19/2011  . OSA (obstructive sleep apnea) 04/05/2011    Dorene Ar, PTA 03/03/2015, 2:34 PM  [place] [facility] [address] [place], [place], [address] Phone: [phone]   Fax:  [phone]

## 2015-03-03 NOTE — Patient Instructions (Addendum)
HIP: Abduction - Side-Lying   Lie on side, legs straight and in line with trunk. Squeeze glutes. Raise top leg up and slightly back. Point toes forward. _10__ reps per set, _2__ sets per day, _7__ days per week Bend bottom leg to stabilize pelvis.  Copyright  VHI. All rights reserved.   Bridge   Lie back, legs bent. Inhale, pressing hips up. Keeping ribs in, lengthen lower back. Exhale, rolling down along spine from top. Repeat __10-20__ times. Do __2__ sessions per day.  Copyright  VHI. All rights reserved.           Sleeping on Back  Place pillow under knees. A pillow with cervical support and a roll around waist are also helpful. Copyright  VHI. All rights reserved.  Sleeping on Side Place pillow between knees. Use cervical support under neck and a roll around waist as needed. Copyright  VHI. All rights reserved.   Sleeping on Stomach   If this is the only desirable sleeping position, place pillow under lower legs, and under stomach or chest as needed.  Posture - Sitting   Sit upright, head facing forward. Try using a roll to support lower back. Keep shoulders relaxed, and avoid rounded back. Keep hips level with knees. Avoid crossing legs for long periods. Stand to Sit / Sit to Stand   To sit: Bend knees to lower self onto front edge of chair, then scoot back on seat. To stand: Reverse sequence by placing one foot forward, and scoot to front of seat. Use rocking motion to stand up.   Work Height and Reach  Ideal work height is no more than 2 to 4 inches below elbow level when standing, and at elbow level when sitting. Reaching should be limited to arm's length, with elbows slightly bent.  Bending  Bend at hips and knees, not back. Keep feet shoulder-width apart.    Posture - Standing   Good posture is important. Avoid slouching and forward head thrust. Maintain curve in low back and align ears over shoul- ders, hips over ankles.  Alternating  Positions   Alternate tasks and change positions frequently to reduce fatigue and muscle tension. Take rest breaks. Computer Work   Position work to Programmer, multimedia. Use proper work and seat height. Keep shoulders back and down, wrists straight, and elbows at right angles. Use chair that provides full back support. Add footrest and lumbar roll as needed.  Getting Into / Out of Car  Lower self onto seat, scoot back, then bring in one leg at a time. Reverse sequence to get out.  Dressing  Lie on back to pull socks or slacks over feet, or sit and bend leg while keeping back straight.    Housework - Sink  Place one foot on ledge of cabinet under sink when standing at sink for prolonged periods.   Pushing / Pulling  Pushing is preferable to pulling. Keep back in proper alignment, and use leg muscles to do the work.  Deep Squat   Squat and lift with both arms held against upper trunk. Tighten stomach muscles without holding breath. Use smooth movements to avoid jerking.  Avoid Twisting   Avoid twisting or bending back. Pivot around using foot movements, and bend at knees if needed when reaching for articles.  Carrying Luggage   Distribute weight evenly on both sides. Use a cart whenever possible. Do not twist trunk. Move body as a unit.   Lifting Principles .Maintain proper posture and head alignment. .Slide  object as close as possible before lifting. .Move obstacles out of the way. .Test before lifting; ask for help if too heavy. .Tighten stomach muscles without holding breath. .Use smooth movements; do not jerk. .Use legs to do the work, and pivot with feet. .Distribute the work load symmetrically and close to the center of trunk. .Push instead of pull whenever possible.   Ask For Help   Ask for help and delegate to others when possible. Coordinate your movements when lifting together, and maintain the low back curve.  Log Roll   Lying on back, bend left knee  and place left arm across chest. Roll all in one movement to the right. Reverse to roll to the left. Always move as one unit. Housework - Sweeping  Use long-handled equipment to avoid stooping.   Housework - Wiping  Position yourself as close as possible to reach work surface. Avoid straining your back.  Laundry - Unloading Wash   To unload small items at bottom of washer, lift leg opposite to arm being used to reach.  Perla close to area to be raked. Use arm movements to do the work. Keep back straight and avoid twisting.     Cart  When reaching into cart with one arm, lift opposite leg to keep back straight.   Getting Into / Out of Bed  Lower self to lie down on one side by raising legs and lowering head at the same time. Use arms to assist moving without twisting. Bend both knees to roll onto back if desired. To sit up, start from lying on side, and use same move-ments in reverse. Housework - Vacuuming  Hold the vacuum with arm held at side. Step back and forth to move it, keeping head up. Avoid twisting.   Laundry - IT consultant so that bending and twisting can be avoided.   Laundry - Unloading Dryer  Squat down to reach into clothes dryer or use a reacher.  Gardening - Weeding / Probation officer or Kneel. Knee pads may be helpful.

## 2015-03-05 ENCOUNTER — Encounter: Payer: Medicare Other | Admitting: Physical Therapy

## 2015-03-05 ENCOUNTER — Ambulatory Visit: Payer: Medicare Other | Admitting: Physical Therapy

## 2015-03-05 DIAGNOSIS — E1121 Type 2 diabetes mellitus with diabetic nephropathy: Secondary | ICD-10-CM | POA: Diagnosis not present

## 2015-03-05 DIAGNOSIS — I10 Essential (primary) hypertension: Secondary | ICD-10-CM | POA: Diagnosis not present

## 2015-03-05 DIAGNOSIS — E662 Morbid (severe) obesity with alveolar hypoventilation: Secondary | ICD-10-CM | POA: Diagnosis not present

## 2015-03-05 DIAGNOSIS — R0602 Shortness of breath: Secondary | ICD-10-CM | POA: Diagnosis not present

## 2015-03-17 ENCOUNTER — Ambulatory Visit: Payer: Medicare Other | Admitting: Physical Therapy

## 2015-03-17 DIAGNOSIS — R29898 Other symptoms and signs involving the musculoskeletal system: Secondary | ICD-10-CM

## 2015-03-17 DIAGNOSIS — M545 Low back pain, unspecified: Secondary | ICD-10-CM

## 2015-03-17 NOTE — Therapy (Signed)
[place], [place], [address] Phone: [phone]   Fax:  [phone]  Physical Therapy Treatment  Patient Details  Name: Catherine Williams MRN: [id number] Date of Birth: 04-20-1961 Referring Provider:  Nolene Ebbs, MD  Encounter Date: 03/17/2015      PT End of Session - 03/17/15 1041    Visit Number 7   Number of Visits 16   Date for PT Re-Evaluation 03/30/15   PT Start Time 3875   PT Stop Time 1105   PT Time Calculation (min) 50 min   Activity Tolerance Patient tolerated treatment well   Behavior During Therapy [facility] for tasks assessed/performed      Past Medical History  Diagnosis Date  . Hypertension   . Heart failure   . Asthma   . Diabetes mellitus   . Hyperlipidemia   . Kidney disease   . Pinched nerve   . Morbid obesity   . COPD (chronic obstructive pulmonary disease)   . Sleep apnea   . Peripheral vascular disease   . Arthritis Dec. 2014    Gout  . Anemia   . CHF (congestive heart failure)     Past Surgical History  Procedure Laterality Date  . Ovary surgery      Left  . Hand surgery      right  . Atherectomy and angioplasty  10/18/2011    left posterior tibial artery  . 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  Visit Diagnosis:  Midline low back pain without sciatica  Weakness of both lower extremities      Subjective Assessment - 03/17/15 1019    Symptoms pt reports that she is doing well since the last visit. pt reports that she has been going to the gym 2 times in the last week and walking around the neighbor hood 2 times yesterday.   Currently in Pain? Yes   Pain Score 4    Pain Location Back   Pain Orientation Right;Left;Lower   Pain Descriptors / Indicators Other (Comment)  prickling    Pain Type Chronic pain   Pain Onset More than a month ago   Pain Frequency Intermittent                        [facility] Adult PT Treatment/Exercise - 03/17/15 1022    Lumbar Exercises: Stretches   Passive Hamstring Stretch 2 reps;30 seconds   Lower Trunk Rotation 5 reps;20 seconds   Lumbar Exercises: Aerobic   Stationary Bike Nustep L4 LE only x 15 min   Lumbar Exercises: Seated   Other Seated Lumbar Exercises Sit-stand x 10 without UE support.   cueing for transverse abdominus   Other Seated Lumbar Exercises active self lumbar P/A with slight extension mobilization with sheet   Lumbar Exercises: Supine   Bridge 10 reps  with ball between knees to assist with form   Other Supine Lumbar Exercises supine marching 2 x 10  with abdominal drawin manuever   Knee/Hip Exercises: Sidelying   Clams 2 x 10   with red theraband   Modalities   Modalities Moist Heat   Moist Heat Therapy   Number Minutes Moist Heat 10 Minutes   Moist Heat Location --  low back while sitting                PT Education -  03/17/15 1040    Education provided Yes   Education Details hamstring stretch, glute stretch, posture education   Person(s) Educated Patient   Methods Explanation   Comprehension Verbalized understanding          PT Short Term Goals - 03/03/15 1339    PT SHORT TERM GOAL #1   Title independent with HEP (02/26/15)   Time 4   Period Weeks   Status Achieved   PT SHORT TERM GOAL #2   Title report pain < 4/10 for improved function (02/26/15)   Time 4   Period Weeks   Status Achieved   PT SHORT TERM GOAL #3   Title tolerate standing at least 10 min for improved function with ADLs without increase in pain (02/26/15)   Time 4   Period Weeks   Status Achieved           PT Long Term Goals - 03/03/15 1425    PT LONG TERM GOAL #1   Title verbalize understanding of posture/body mechanics to reduce risk of reinjury (03/26/15)   Time 8   Period Weeks   Status Achieved   PT LONG TERM GOAL #2   Title report pain < 3/10 with activity for improved function  (03/26/15)   Time 8   Period Weeks   Status On-going   PT LONG TERM GOAL #3   Title report ability to cook standing x 15 min without increase in pain (03/26/15)   Time 8   Period Weeks   Status Achieved   PT LONG TERM GOAL #4   Title initiate walking program for home and pt to report compliance at least 3 days per week (03/26/15)   Time 8   Period Weeks   Status Partially Met               Plan - 03/17/15 1249    Clinical Impression Statement Hafsa continues to make progress with standing/ walking tolerance, and reports going to the gym 2 and walking around her neighborhood. performed self lumbar mobilization with sheet with extension, which she reported relief of pain. Gave her hamstrings, and glute stretches added to her HEP.   PT Next Visit Plan  continue L spine mobility and core strength, continue seated lumbar stretching, hip strengthening, HMP as needed   PT Home Exercise Plan hamstring stretch, glute stretch   Consulted and Agree with Plan of Care Patient        Problem List Patient Active Problem List   Diagnosis Date Noted  . Aftercare following surgery of the circulatory system, [place] 12/30/2013  . Peripheral vascular disease, unspecified 05/07/2012  . Visit for wound check 12/19/2011  . OSA (obstructive sleep apnea) 04/05/2011   Starr Lake PT, DPT, LAT, ATC  03/17/2015  12:54 PM   [facility] [address] [place], [place], [address] Phone: [phone]   Fax:  [phone]

## 2015-03-17 NOTE — Patient Instructions (Signed)
   Ceonna Frazzini PT, DPT, LAT, ATC   Outpatient Rehabilitation Phone: [phone]     

## 2015-03-19 ENCOUNTER — Ambulatory Visit: Payer: Medicare Other | Admitting: Physical Therapy

## 2015-03-19 DIAGNOSIS — R5381 Other malaise: Secondary | ICD-10-CM

## 2015-03-19 DIAGNOSIS — R29898 Other symptoms and signs involving the musculoskeletal system: Secondary | ICD-10-CM

## 2015-03-19 DIAGNOSIS — M545 Low back pain, unspecified: Secondary | ICD-10-CM

## 2015-03-19 NOTE — Therapy (Signed)
[place], [place], [address] Phone: [phone]   Fax:  [phone]  Physical Therapy Treatment  Patient Details  Name: Catherine Williams MRN: [id number] Date of Birth: 09/21/1961 Referring Provider:  Nolene Ebbs, MD  Encounter Date: 03/19/2015      PT End of Session - 03/19/15 1337    Visit Number 8   Number of Visits 16   Date for PT Re-Evaluation 03/30/15   PT Start Time 0130   PT Stop Time 0203   PT Time Calculation (min) 33 min      Past Medical History  Diagnosis Date  . Hypertension   . Heart failure   . Asthma   . Diabetes mellitus   . Hyperlipidemia   . Kidney disease   . Pinched nerve   . Morbid obesity   . COPD (chronic obstructive pulmonary disease)   . Sleep apnea   . Peripheral vascular disease   . Arthritis Dec. 2014    Gout  . Anemia   . CHF (congestive heart failure)     Past Surgical History  Procedure Laterality Date  . Ovary surgery      Left  . Hand surgery      right  . Atherectomy and angioplasty  10/18/2011    left posterior tibial artery  . 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  Visit Diagnosis:  Midline low back pain without sciatica  Weakness of both lower extremities  Debility      Subjective Assessment - 03/19/15 1331    Symptoms Only able to go one day a week to gym due to schedule of daughter. I should have my license this month so I can go myself. I was sore after last visit. I have been walking after dinner avery day.    Currently in Pain? Yes   Pain Score 4    Pain Location Back   Pain Orientation Right;Left;Lower   Pain Descriptors / Indicators Heaviness  a weight on my back   Pain Type Chronic pain   Aggravating Factors  going up and down stairs    Pain Relieving Factors tramadol, muscle relaxers, heat patch                       [facility] Adult PT  Treatment/Exercise - 03/19/15 1343    Lumbar Exercises: Stretches   Active Hamstring Stretch 2 reps;20 seconds   Single Knee to Chest Stretch 3 reps;20 seconds   Lumbar Exercises: Aerobic   Stationary Bike Nustpel Level 5  x 10 min   Lumbar Exercises: Standing   Wall Slides 10 reps   Wall Slides Limitations with abdominal draw ins   Other Standing Lumbar Exercises step ups 10 x each with abdominal draw ins and minimal increase in pain.    Other Standing Lumbar Exercises Standing SLS with hip abduction 10 x 2 march 10 x2 with abdominal draw ins   pt c/o increased LBP to 6/10                  PT Short Term Goals - 03/03/15 1339    PT SHORT TERM GOAL #1   Title independent with HEP (02/26/15)   Time 4   Period Weeks   Status Achieved   PT SHORT TERM GOAL #2   Title report pain < 4/10 for improved function (  02/26/15)   Time 4   Period Weeks   Status Achieved   PT SHORT TERM GOAL #3   Title tolerate standing at least 10 min for improved function with ADLs without increase in pain (02/26/15)   Time 4   Period Weeks   Status Achieved           PT Long Term Goals - 03/03/15 1425    PT LONG TERM GOAL #1   Title verbalize understanding of posture/body mechanics to reduce risk of reinjury (03/26/15)   Time 8   Period Weeks   Status Achieved   PT LONG TERM GOAL #2   Title report pain < 3/10 with activity for improved function (03/26/15)   Time 8   Period Weeks   Status On-going   PT LONG TERM GOAL #3   Title report ability to cook standing x 15 min without increase in pain (03/26/15)   Time 8   Period Weeks   Status Achieved   PT LONG TERM GOAL #4   Title initiate walking program for home and pt to report compliance at least 3 days per week (03/26/15)   Time 8   Period Weeks   Status Partially Met               Plan - 03/19/15 1359    Clinical Impression Statement Pt needed to leave 15 minutes early for a doctor's appointment today. Able to tolerate wall  slides, step ups and standing hip exercises without rest breaks. Pt reports increased LB pain with standing 2 way hip exercises.    PT Next Visit Plan continue lumbar ROM, core, continue standing hip strengthening as tolerated.         Problem List Patient Active Problem List   Diagnosis Date Noted  . Aftercare following surgery of the circulatory system, [place] 12/30/2013  . Peripheral vascular disease, unspecified 05/07/2012  . Visit for wound check 12/19/2011  . OSA (obstructive sleep apnea) 04/05/2011    Dorene Ar, PTA 03/19/2015, 2:08 PM  [facility] [address] [place], [place], [address] Phone: [phone]   Fax:  [phone]

## 2015-03-24 ENCOUNTER — Encounter: Payer: Medicare Other | Admitting: Physical Therapy

## 2015-03-26 ENCOUNTER — Ambulatory Visit: Payer: Medicare Other | Attending: Specialist | Admitting: Physical Therapy

## 2015-03-26 DIAGNOSIS — M545 Low back pain, unspecified: Secondary | ICD-10-CM

## 2015-03-26 DIAGNOSIS — R29898 Other symptoms and signs involving the musculoskeletal system: Secondary | ICD-10-CM | POA: Diagnosis not present

## 2015-03-26 DIAGNOSIS — J449 Chronic obstructive pulmonary disease, unspecified: Secondary | ICD-10-CM | POA: Diagnosis not present

## 2015-03-26 DIAGNOSIS — R5381 Other malaise: Secondary | ICD-10-CM

## 2015-03-26 DIAGNOSIS — N184 Chronic kidney disease, stage 4 (severe): Secondary | ICD-10-CM | POA: Diagnosis not present

## 2015-03-26 DIAGNOSIS — E1140 Type 2 diabetes mellitus with diabetic neuropathy, unspecified: Secondary | ICD-10-CM | POA: Diagnosis not present

## 2015-03-26 DIAGNOSIS — Z1239 Encounter for other screening for malignant neoplasm of breast: Secondary | ICD-10-CM | POA: Diagnosis not present

## 2015-03-26 DIAGNOSIS — Z6841 Body Mass Index (BMI) 40.0 and over, adult: Secondary | ICD-10-CM | POA: Diagnosis not present

## 2015-03-26 DIAGNOSIS — I10 Essential (primary) hypertension: Secondary | ICD-10-CM | POA: Diagnosis not present

## 2015-03-26 DIAGNOSIS — E784 Other hyperlipidemia: Secondary | ICD-10-CM | POA: Diagnosis not present

## 2015-03-26 NOTE — Therapy (Addendum)
[place], [place], [address] Phone: [phone]   Fax:  [phone]  Physical Therapy Treatment  Patient Details  Name: Catherine Williams MRN: [id number] Date of Birth: 16-Jul-1961 Referring Provider:  Nolene Ebbs, MD  Encounter Date: 03/26/2015      PT End of Session - 03/26/15 1741    Visit Number 9   Number of Visits 16   Date for PT Re-Evaluation 03/30/15   PT Start Time 0134   PT Stop Time 0215   PT Time Calculation (min) 41 min      Past Medical History  Diagnosis Date  . Hypertension   . Heart failure   . Asthma   . Diabetes mellitus   . Hyperlipidemia   . Kidney disease   . Pinched nerve   . Morbid obesity   . COPD (chronic obstructive pulmonary disease)   . Sleep apnea   . Peripheral vascular disease   . Arthritis Dec. 2014    Gout  . Anemia   . CHF (congestive heart failure)     Past Surgical History  Procedure Laterality Date  . Ovary surgery      Left  . Hand surgery      right  . Atherectomy and angioplasty  10/18/2011    left posterior tibial artery  . 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  Visit Diagnosis:  Midline low back pain without sciatica  Weakness of both lower extremities  Debility   G code: Mobility: Walking & moving Around Goal status: CK Discharge Status: CK       [facility] PT Assessment - 03/26/15 1351    Observation/Other Assessments   Focus on Therapeutic Outcomes (FOTO)  44 % limited (decreased from 53% limited and 42% predicted)   AROM   Lumbar Flexion 35   Lumbar - Right Side Bend 30   Lumbar - Left Side Bend 30   Strength   Right Hip Flexion 4+/5   Right Hip ABduction 4+/5   Left Hip Flexion 4+/5   Left Hip ABduction 4+/5   Right Knee Flexion 4+/5   Right Knee Extension 4+/5   Left Knee Flexion 4+/5   Left Knee Extension 4+/5                   [facility] Adult  PT Treatment/Exercise - 03/26/15 0001    Lumbar Exercises: Stretches   Single Knee to Chest Stretch 3 reps;30 seconds   Single Knee to Chest Stretch Limitations passive   Lumbar Exercises: Aerobic   Stationary Bike Nustpel Level 5  x 10 min   Lumbar Exercises: Supine   Ab Set 10 reps  supine with wedge to elevate head   Clam 15 reps  red theraband    Heel Slides 10 reps  with ab set   Bent Knee Raise 10 reps   Knee/Hip Exercises: Stretches   Active Hamstring Stretch 3 reps;30 seconds   Active Hamstring Stretch Limitations foot on floor   Passive Hamstring Stretch 3 reps;30 seconds                  PT Short Term Goals - 03/03/15 1339    PT SHORT TERM GOAL #1   Title independent with HEP (02/26/15)   Time 4   Period Weeks   Status Achieved   PT SHORT TERM GOAL #2  Title report pain < 4/10 for improved function (02/26/15)   Time 4   Period Weeks   Status Achieved   PT SHORT TERM GOAL #3   Title tolerate standing at least 10 min for improved function with ADLs without increase in pain (02/26/15)   Time 4   Period Weeks   Status Achieved           PT Long Term Goals - 03/26/15 1739    PT LONG TERM GOAL #1   Title verbalize understanding of posture/body mechanics to reduce risk of reinjury (03/26/15)   Time 8   Period Weeks   Status Achieved   PT LONG TERM GOAL #2   Title report pain < 3/10 with activity for improved function (03/26/15)   Time 8   Period Weeks   Status Achieved   PT LONG TERM GOAL #3   Title report ability to cook standing x 15 min without increase in pain (03/26/15)   Time 8   Period Weeks   Status Achieved   PT LONG TERM GOAL #4   Title initiate walking program for home and pt to report compliance at least 3 days per week (03/26/15)   Time 8   Period Weeks   Status Achieved               Plan - 03/26/15 1737    Clinical Impression Statement All LTGs MET. Pt is attending [employer] and also walking at least 3 times per week. He LBP has  decreased to less than a 3/10 on most days with ADLs including walking and standing activities. Her LE strength has improved to 4+/5 bilateral.    PT Next Visit Plan Discharge today        Problem List Patient Active Problem List   Diagnosis Date Noted  . Aftercare following surgery of the circulatory system, [place] 12/30/2013  . Peripheral vascular disease, unspecified 05/07/2012  . Visit for wound check 12/19/2011  . OSA (obstructive sleep apnea) 04/05/2011    Dorene Ar, PTA 03/26/2015, 5:46 PM  [place], [place], [address] Phone: [phone]   Fax:  [phone]     PHYSICAL THERAPY DISCHARGE SUMMARY  Visits from Start of Care: 9  Current functional level related to goals / functional outcomes: FOTO: 44% limited   Remaining deficits: N/A   Education / Equipment: HEP handout  Plan: Patient agrees to discharge.  Patient goals were met. Patient is being discharged due to meeting the stated rehab goals.  ?????   Kristoffer Leamon PT, DPT, LAT, ATC  03/31/2015  7:57 AM

## 2015-03-26 NOTE — Patient Instructions (Signed)
   PELVIC TILT  Lie on back, legs bent. Exhale, tilting top of pelvis back, pubic bone up, to flatten lower back. Inhale, rolling pelvis opposite way, top forward, pubic bone down, arch in back. Repeat __10__ times. Do __2__ sessions per day. Copyright  VHI. All rights reserved.    Isometric Hold With Pelvic Floor (Hook-Lying)  Lie with hips and knees bent. Slowly inhale, and then exhale. Pull navel toward spine and tighten pelvic floor. Hold for __10_ seconds. Continue to breathe in and out during hold. Rest for _10__ seconds. Repeat __10_ times. Do __2-3_ times a day.   Knee Fold  Lie on back, legs bent, arms by sides. Exhale, lifting knee to chest. Inhale, returning. Keep abdominals flat, navel to spine. Repeat __10__ times, alternating legs. Do __2__ sessions per day.  Knee Drop  Keep pelvis stable. Without rotating hips, slowly drop knee to side, pause, return to center, bring knee across midline toward opposite hip. Feel obliques engaging. Repeat for ___10_ times each leg.   Copyright  VHI. All rights reserved.       Heel Slide to Straight   Slide one leg down to straight. Return. Be sure pelvis does not rock forward, tilt, rotate, or tip to side. Do _10__ times. Restabilize pelvis. Repeat with other leg. Do __1-2_ sets, __2_ times per day.  http://ss.exer.us/16   Copyright  VHI. All rights reserved.  HIP: Hamstrings - Short Sitting   Rest leg on raised surface. Keep knee straight. Lift chest. Hold _30__ seconds. _3__ reps per set, _2__ sets per day, _7__ days per week  Copyright  VHI. All rights reserved.

## 2015-03-31 ENCOUNTER — Encounter: Payer: Medicare Other | Admitting: Physical Therapy

## 2015-04-02 ENCOUNTER — Encounter: Payer: Medicare Other | Admitting: Physical Therapy

## 2015-04-23 DIAGNOSIS — M5440 Lumbago with sciatica, unspecified side: Secondary | ICD-10-CM | POA: Diagnosis not present

## 2015-04-23 DIAGNOSIS — M4806 Spinal stenosis, lumbar region: Secondary | ICD-10-CM | POA: Diagnosis not present

## 2015-05-01 ENCOUNTER — Other Ambulatory Visit: Payer: Self-pay

## 2015-05-01 DIAGNOSIS — Z1231 Encounter for screening mammogram for malignant neoplasm of breast: Secondary | ICD-10-CM

## 2015-05-05 ENCOUNTER — Ambulatory Visit: Payer: Medicare Other

## 2015-05-07 ENCOUNTER — Ambulatory Visit: Payer: Medicare Other

## 2015-05-08 DIAGNOSIS — D509 Iron deficiency anemia, unspecified: Secondary | ICD-10-CM | POA: Diagnosis not present

## 2015-05-08 DIAGNOSIS — N183 Chronic kidney disease, stage 3 (moderate): Secondary | ICD-10-CM | POA: Diagnosis not present

## 2015-05-08 DIAGNOSIS — E1129 Type 2 diabetes mellitus with other diabetic kidney complication: Secondary | ICD-10-CM | POA: Diagnosis not present

## 2015-05-08 DIAGNOSIS — N2581 Secondary hyperparathyroidism of renal origin: Secondary | ICD-10-CM | POA: Diagnosis not present

## 2015-05-08 DIAGNOSIS — I129 Hypertensive chronic kidney disease with stage 1 through stage 4 chronic kidney disease, or unspecified chronic kidney disease: Secondary | ICD-10-CM | POA: Diagnosis not present

## 2015-05-13 ENCOUNTER — Ambulatory Visit
Admission: RE | Admit: 2015-05-13 | Discharge: 2015-05-13 | Disposition: A | Discharge status: Home / Self Care | Payer: Medicare Other | Source: Ambulatory Visit

## 2015-05-13 DIAGNOSIS — Z1231 Encounter for screening mammogram for malignant neoplasm of breast: Secondary | ICD-10-CM

## 2015-07-02 ENCOUNTER — Emergency Department: Payer: Medicare Other

## 2015-07-02 ENCOUNTER — Emergency Department
Admission: EM | Admit: 2015-07-02 | Discharge: 2015-07-02 | Disposition: A | Discharge status: Home / Self Care | Payer: Medicare Other | Attending: Emergency Medicine | Admitting: Emergency Medicine

## 2015-07-02 DIAGNOSIS — R200 Anesthesia of skin: Secondary | ICD-10-CM | POA: Diagnosis not present

## 2015-07-02 DIAGNOSIS — M542 Cervicalgia: Secondary | ICD-10-CM | POA: Diagnosis not present

## 2015-07-02 DIAGNOSIS — Z87891 Personal history of nicotine dependence: Secondary | ICD-10-CM | POA: Insufficient documentation

## 2015-07-02 DIAGNOSIS — R51 Headache: Secondary | ICD-10-CM | POA: Diagnosis not present

## 2015-07-02 DIAGNOSIS — I10 Essential (primary) hypertension: Secondary | ICD-10-CM | POA: Diagnosis not present

## 2015-07-02 DIAGNOSIS — R519 Headache, unspecified: Secondary | ICD-10-CM

## 2015-07-02 LAB — CBC
HEMATOCRIT: 36.2 % (ref 35.0–47.0)
HEMOGLOBIN: 11.1 g/dL — AB (ref 12.0–16.0)
MCH: 25.1 pg — ABNORMAL LOW (ref 26.0–34.0)
MCHC: 30.7 g/dL — AB (ref 32.0–36.0)
MCV: 81.6 fL (ref 80.0–100.0)
PLATELETS: 250 10*3/uL (ref 150–440)
RBC: 4.44 MIL/uL (ref 3.80–5.20)
RDW: 17 % — ABNORMAL HIGH (ref 11.5–14.5)
WBC: 11.7 10*3/uL — ABNORMAL HIGH (ref 3.6–11.0)

## 2015-07-02 LAB — BASIC METABOLIC PANEL
Anion gap: 12 (ref 5–15)
BUN: 44 mg/dL — ABNORMAL HIGH (ref 6–20)
CHLORIDE: 95 mmol/L — AB (ref 101–111)
CO2: 34 mmol/L — ABNORMAL HIGH (ref 22–32)
Calcium: 9.5 mg/dL (ref 8.9–10.3)
Creatinine, Ser: 2.73 mg/dL — ABNORMAL HIGH (ref 0.44–1.00)
GFR calc non Af Amer: 18 mL/min — ABNORMAL LOW (ref >60)
GFR, EST AFRICAN AMERICAN: 21 mL/min — AB (ref >60)
Glucose, Bld: 188 mg/dL — ABNORMAL HIGH (ref 65–99)
Potassium: 4.3 mmol/L (ref 3.5–5.1)
Sodium: 141 mmol/L (ref 135–145)

## 2015-07-02 MED ORDER — SODIUM CHLORIDE 0.9 % IV SOLN
Freq: Once | INTRAVENOUS | Status: AC
  Administered 2015-07-02: 21:00:00 via INTRAVENOUS

## 2015-07-02 MED ORDER — MORPHINE SULFATE 4 MG/ML IJ SOLN
4.0000 mg | Freq: Once | INTRAMUSCULAR | Status: AC
  Administered 2015-07-02: 4 mg via INTRAVENOUS
  Filled 2015-07-02: qty 1

## 2015-07-02 MED ORDER — METOCLOPRAMIDE HCL 5 MG/ML IJ SOLN
10.0000 mg | Freq: Once | INTRAMUSCULAR | Status: AC
  Administered 2015-07-02: 10 mg via INTRAVENOUS
  Filled 2015-07-02 (×2): qty 2

## 2015-07-02 NOTE — ED Notes (Signed)
Pt placed on nasal canula 3lpm, as she does at home.

## 2015-07-02 NOTE — ED Provider Notes (Signed)
[facility] Emergency Department Provider Note     Time seen: ----------------------------------------- 8:56 PM on 07/02/2015 -----------------------------------------    I have reviewed the triage vital signs and the nursing notes.   HISTORY  Chief Complaint Headache    HPI Catherine Williams is a 54 y.o. female who presents ER for headache and neck pain since Tuesday afternoon. Patient reports a fit for a feeling of the right side of her face and right neck sometimes feeling like a pulling sensation. Patient has not had light or sound sensitivity, this was not abrupt in onset, more gradual. It was not exertional in onset. Patient is taking over-the-counter medications with no improvement. Pain is moderate to severe at this time. Pain seems to be located in the right occiput   Past Medical History  Diagnosis Date  . Hypertension   . Heart failure   . Asthma   . Diabetes mellitus   . Hyperlipidemia   . Kidney disease   . Pinched nerve   . Morbid obesity   . COPD (chronic obstructive pulmonary disease)   . Sleep apnea   . Peripheral vascular disease   . Arthritis Dec. 2014    Gout  . Anemia   . CHF (congestive heart failure)     Patient Active Problem List   Diagnosis Date Noted  . Aftercare following surgery of the circulatory system, [place] 12/30/2013  . Peripheral vascular disease, unspecified 05/07/2012  . Visit for wound check 12/19/2011  . OSA (obstructive sleep apnea) 04/05/2011    Past Surgical History  Procedure Laterality Date  . Ovary surgery      Left  . Hand surgery      right  . Atherectomy and angioplasty  10/18/2011    left posterior tibial artery  . 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 Smoking status: Former Smoker    Types: Cigarettes  . 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  .  Alcohol Use: No    Review of Systems Constitutional: Negative for fever. Eyes: Negative for visual changes. ENT: Negative for sore throat. Cardiovascular: Negative for chest pain. Respiratory: Negative for shortness of breath. Gastrointestinal: Negative for abdominal pain, vomiting and diarrhea. Genitourinary: Negative for dysuria. Musculoskeletal: Negative for back pain. Skin: Negative for rash. Neurological: Positive for headache  10-point ROS otherwise negative.  ____________________________________________   PHYSICAL EXAM:  VITAL SIGNS: ED Triage Vitals  Enc Vitals Group     BP 07/02/15 1922 170/70 mmHg     Pulse Rate 07/02/15 1922 75     Resp 07/02/15 2035 18     Temp 07/02/15 1922 98.8 F (37.1 C)     Temp Source 07/02/15 1922 Oral     SpO2 07/02/15 1922 98 %     Weight 07/02/15 1922 342 lb (155.13 kg)     Height 07/02/15 1922 5\' 3"  (1.6 m)     Head Cir --      Peak Flow --      Pain Score 07/02/15 2035 5     Pain Loc --      Pain Edu? --      Excl. in [place]? --     Constitutional: Alert and oriented. Obese,well appearing and in no distress. Eyes: Conjunctivae are normal. PERRL. Normal extraocular movements. ENT   Head: Normocephalic  and atraumatic. Occipital scalp tenderness on the right   Nose: No congestion/rhinnorhea.   Mouth/Throat: Mucous membranes are moist.    Neck: No stridor. No meningeal sounds, right paraspinous muscle tenderness Hematological/Lymphatic/Immunilogical: No cervical lymphadenopathy. Cardiovascular: Normal rate, regular rhythm. Normal and symmetric distal pulses are present in all extremities. No murmurs, rubs, or gallops. Respiratory: Normal respiratory effort without tachypnea nor retractions. Breath sounds are clear and equal bilaterally. No wheezes/rales/rhonchi. Gastrointestinal: Soft and nontender. No distention. No abdominal bruits. There is no CVA tenderness. Musculoskeletal: Nontender with normal range of motion in  all extremities. No joint effusions.  No lower extremity tenderness nor edema. Neurologic:  Normal speech and language. No gross focal neurologic deficits are appreciated. Speech is normal. No gait instability. Skin:  Skin is warm, dry and intact. No rash noted. Psychiatric: Mood and affect are normal. Speech and behavior are normal. Patient exhibits appropriate insight and judgment. ____________________________________________  ED COURSE:  Pertinent labs & imaging results that were available during my care of the patient were reviewed by me and considered in my medical decision making (see chart for details). Patient being given IV saline, Reglan, will need head CT and basic labs. ____________________________________________    LABS (pertinent positives/negatives)  Labs Reviewed  CBC - Abnormal; Notable for the following:    WBC 11.7 (*)    Hemoglobin 11.1 (*)    MCH 25.1 (*)    MCHC 30.7 (*)    RDW 17.0 (*)    All other components within normal limits  BASIC METABOLIC PANEL - Abnormal; Notable for the following:    Chloride 95 (*)    CO2 34 (*)    Glucose, Bld 188 (*)    BUN 44 (*)    Creatinine, Ser 2.73 (*)    GFR calc non Af Amer 18 (*)    GFR calc Af Amer 21 (*)    All other components within normal limits    RADIOLOGY Images were viewed by me  CT head is unremarkable  ____________________________________________  FINAL ASSESSMENT AND PLAN  Headache and neck pain  Plan: Pain is reproducible and appears to be in the right occiput and paraspinous muscles. Pain is exacerbated with range of motion of the head and neck. Patient with complete resolution in her symptoms after saline, Reglan and a small dose of morphine. She is advised to follow-up with her doctor in the next couple days for reevaluation. Also advised her taking Tylenol PM to help her sleep at night.   Earleen Newport, MD   Earleen Newport, MD 07/02/15 [phone]

## 2015-07-02 NOTE — Discharge Instructions (Signed)
General Headache Without Cause A headache is pain or discomfort felt around the head or neck area. The specific cause of a headache may not be found. There are many causes and types of headaches. A few common ones are:  Tension headaches.  Migraine headaches.  Cluster headaches.  Chronic daily headaches. HOME CARE INSTRUCTIONS   Keep all follow-up appointments with your caregiver or any specialist referral.  Only take over-the-counter or prescription medicines for pain or discomfort as directed by your caregiver.  Lie down in a dark, quiet room when you have a headache.  Keep a headache journal to find out what may trigger your migraine headaches. For example, write down:  What you eat and drink.  How much sleep you get.  Any change to your diet or medicines.  Try massage or other relaxation techniques.  Put ice packs or heat on the head and neck. Use these 3 to 4 times per day for 15 to 20 minutes each time, or as needed.  Limit stress.  Sit up straight, and do not tense your muscles.  Quit smoking if you smoke.  Limit alcohol use.  Decrease the amount of caffeine you drink, or stop drinking caffeine.  Eat and sleep on a regular schedule.  Get 7 to 9 hours of sleep, or as recommended by your caregiver.  Keep lights dim if bright lights bother you and make your headaches worse. SEEK MEDICAL CARE IF:   You have problems with the medicines you were prescribed.  Your medicines are not working.  You have a change from the usual headache.  You have nausea or vomiting. SEEK IMMEDIATE MEDICAL CARE IF:   Your headache becomes severe.  You have a fever.  You have a stiff neck.  You have loss of vision.  You have muscular weakness or loss of muscle control.  You start losing your balance or have trouble walking.  You feel faint or pass out.  You have severe symptoms that are different from your first symptoms. MAKE SURE YOU:   Understand these  instructions.  Will watch your condition.  Will get help right away if you are not doing well or get worse. Document Released: 12/05/2005 Document Revised: 02/27/2012 Document Reviewed: 12/21/2011 [facility] Patient Information 2015 [place], [place]. This information is not intended to replace advice given to you by your health care provider. Make sure you discuss any questions you have with your health care provider.  Cervical Strain and Sprain (Whiplash) with Rehab Cervical strain and sprain are injuries that commonly occur with "whiplash" injuries. Whiplash occurs when the neck is forcefully whipped backward or forward, such as during a motor vehicle accident or during contact sports. The muscles, ligaments, tendons, discs, and nerves of the neck are susceptible to injury when this occurs. RISK FACTORS Risk of having a whiplash injury increases if:  Osteoarthritis of the spine.  Situations that make head or neck accidents or trauma more likely.  High-risk sports (football, rugby, wrestling, hockey, auto racing, gymnastics, diving, contact karate, or boxing).  Poor strength and flexibility of the neck.  Previous neck injury.  Poor tackling technique.  Improperly fitted or padded equipment. SYMPTOMS   Pain or stiffness in the front or back of neck or both.  Symptoms may present immediately or up to 24 hours after injury.  Dizziness, headache, nausea, and vomiting.  Muscle spasm with soreness and stiffness in the neck.  Tenderness and swelling at the injury site. PREVENTION  Learn and use proper technique (  avoid tackling with the head, spearing, and head-butting; use proper falling techniques to avoid landing on the head).  Warm up and stretch properly before activity.  Maintain physical fitness:  Strength, flexibility, and endurance.  Cardiovascular fitness.  Wear properly fitted and padded protective equipment, such as padded soft collars, for participation in contact  sports. PROGNOSIS  Recovery from cervical strain and sprain injuries is dependent on the extent of the injury. These injuries are usually curable in 1 week to 3 months with appropriate treatment.  RELATED COMPLICATIONS   Temporary numbness and weakness may occur if the nerve roots are damaged, and this may persist until the nerve has completely healed.  Chronic pain due to frequent recurrence of symptoms.  Prolonged healing, especially if activity is resumed too soon (before complete recovery). TREATMENT  Treatment initially involves the use of ice and medication to help reduce pain and inflammation. It is also important to perform strengthening and stretching exercises and modify activities that worsen symptoms so the injury does not get worse. These exercises may be performed at home or with a therapist. For patients who experience severe symptoms, a soft, padded collar may be recommended to be worn around the neck.  Improving your posture may help reduce symptoms. Posture improvement includes pulling your chin and abdomen in while sitting or standing. If you are sitting, sit in a firm chair with your buttocks against the back of the chair. While sleeping, try replacing your pillow with a small towel rolled to 2 inches in diameter, or use a cervical pillow or soft cervical collar. Poor sleeping positions delay healing.  For patients with nerve root damage, which causes numbness or weakness, the use of a cervical traction apparatus may be recommended. Surgery is rarely necessary for these injuries. However, cervical strain and sprains that are present at birth (congenital) may require surgery. MEDICATION   If pain medication is necessary, nonsteroidal anti-inflammatory medications, such as aspirin and ibuprofen, or other minor pain relievers, such as acetaminophen, are often recommended.  Do not take pain medication for 7 days before surgery.  Prescription pain relievers may be given if deemed  necessary by your caregiver. Use only as directed and only as much as you need. HEAT AND COLD:   Cold treatment (icing) relieves pain and reduces inflammation. Cold treatment should be applied for 10 to 15 minutes every 2 to 3 hours for inflammation and pain and immediately after any activity that aggravates your symptoms. Use ice packs or an ice massage.  Heat treatment may be used prior to performing the stretching and strengthening activities prescribed by your caregiver, physical therapist, or athletic trainer. Use a heat pack or a warm soak. SEEK MEDICAL CARE IF:   Symptoms get worse or do not improve in 2 weeks despite treatment.  New, unexplained symptoms develop (drugs used in treatment may produce side effects). EXERCISES RANGE OF MOTION (ROM) AND STRETCHING EXERCISES - Cervical Strain and Sprain These exercises may help you when beginning to rehabilitate your injury. In order to successfully resolve your symptoms, you must improve your posture. These exercises are designed to help reduce the forward-head and rounded-shoulder posture which contributes to this condition. Your symptoms may resolve with or without further involvement from your physician, physical therapist or athletic trainer. While completing these exercises, remember:   Restoring tissue flexibility helps normal motion to return to the joints. This allows healthier, less painful movement and activity.  An effective stretch should be held for at least 20 seconds,  although you may need to begin with shorter hold times for comfort.  A stretch should never be painful. You should only feel a gentle lengthening or release in the stretched tissue. STRETCH- Axial Extensors  Lie on your back on the floor. You may bend your knees for comfort. Place a rolled-up hand towel or dish towel, about 2 inches in diameter, under the part of your head that makes contact with the floor.  Gently tuck your chin, as if trying to make a "double  chin," until you feel a gentle stretch at the base of your head.  Hold __________ seconds. Repeat __________ times. Complete this exercise __________ times per day.  STRETCH - Axial Extension   Stand or sit on a firm surface. Assume a good posture: chest up, shoulders drawn back, abdominal muscles slightly tense, knees unlocked (if standing) and feet hip width apart.  Slowly retract your chin so your head slides back and your chin slightly lowers. Continue to look straight ahead.  You should feel a gentle stretch in the back of your head. Be certain not to feel an aggressive stretch since this can cause headaches later.  Hold for __________ seconds. Repeat __________ times. Complete this exercise __________ times per day. STRETCH - Cervical Side Bend   Stand or sit on a firm surface. Assume a good posture: chest up, shoulders drawn back, abdominal muscles slightly tense, knees unlocked (if standing) and feet hip width apart.  Without letting your nose or shoulders move, slowly tip your right / left ear to your shoulder until your feel a gentle stretch in the muscles on the opposite side of your neck.  Hold __________ seconds. Repeat __________ times. Complete this exercise __________ times per day. STRETCH - Cervical Rotators   Stand or sit on a firm surface. Assume a good posture: chest up, shoulders drawn back, abdominal muscles slightly tense, knees unlocked (if standing) and feet hip width apart.  Keeping your eyes level with the ground, slowly turn your head until you feel a gentle stretch along the back and opposite side of your neck.  Hold __________ seconds. Repeat __________ times. Complete this exercise __________ times per day. RANGE OF MOTION - Neck Circles   Stand or sit on a firm surface. Assume a good posture: chest up, shoulders drawn back, abdominal muscles slightly tense, knees unlocked (if standing) and feet hip width apart.  Gently roll your head down and around  from the back of one shoulder to the back of the other. The motion should never be forced or painful.  Repeat the motion 10-20 times, or until you feel the neck muscles relax and loosen. Repeat __________ times. Complete the exercise __________ times per day. STRENGTHENING EXERCISES - Cervical Strain and Sprain These exercises may help you when beginning to rehabilitate your injury. They may resolve your symptoms with or without further involvement from your physician, physical therapist, or athletic trainer. While completing these exercises, remember:   Muscles can gain both the endurance and the strength needed for everyday activities through controlled exercises.  Complete these exercises as instructed by your physician, physical therapist, or athletic trainer. Progress the resistance and repetitions only as guided.  You may experience muscle soreness or fatigue, but the pain or discomfort you are trying to eliminate should never worsen during these exercises. If this pain does worsen, stop and make certain you are following the directions exactly. If the pain is still present after adjustments, discontinue the exercise until you can discuss  the trouble with your clinician. STRENGTH - Cervical Flexors, Isometric  Face a wall, standing about 6 inches away. Place a small pillow, a ball about 6-8 inches in diameter, or a folded towel between your forehead and the wall.  Slightly tuck your chin and gently push your forehead into the soft object. Push only with mild to moderate intensity, building up tension gradually. Keep your jaw and forehead relaxed.  Hold 10 to 20 seconds. Keep your breathing relaxed.  Release the tension slowly. Relax your neck muscles completely before you start the next repetition. Repeat __________ times. Complete this exercise __________ times per day. STRENGTH- Cervical Lateral Flexors, Isometric   Stand about 6 inches away from a wall. Place a small pillow, a ball  about 6-8 inches in diameter, or a folded towel between the side of your head and the wall.  Slightly tuck your chin and gently tilt your head into the soft object. Push only with mild to moderate intensity, building up tension gradually. Keep your jaw and forehead relaxed.  Hold 10 to 20 seconds. Keep your breathing relaxed.  Release the tension slowly. Relax your neck muscles completely before you start the next repetition. Repeat __________ times. Complete this exercise __________ times per day. STRENGTH - Cervical Extensors, Isometric   Stand about 6 inches away from a wall. Place a small pillow, a ball about 6-8 inches in diameter, or a folded towel between the back of your head and the wall.  Slightly tuck your chin and gently tilt your head back into the soft object. Push only with mild to moderate intensity, building up tension gradually. Keep your jaw and forehead relaxed.  Hold 10 to 20 seconds. Keep your breathing relaxed.  Release the tension slowly. Relax your neck muscles completely before you start the next repetition. Repeat __________ times. Complete this exercise __________ times per day. POSTURE AND BODY MECHANICS CONSIDERATIONS - Cervical Strain and Sprain Keeping correct posture when sitting, standing or completing your activities will reduce the stress put on different body tissues, allowing injured tissues a chance to heal and limiting painful experiences. The following are general guidelines for improved posture. Your physician or physical therapist will provide you with any instructions specific to your needs. While reading these guidelines, remember:  The exercises prescribed by your provider will help you have the flexibility and strength to maintain correct postures.  The correct posture provides the optimal environment for your joints to work. All of your joints have less wear and tear when properly supported by a spine with good posture. This means you will  experience a healthier, less painful body.  Correct posture must be practiced with all of your activities, especially prolonged sitting and standing. Correct posture is as important when doing repetitive low-stress activities (typing) as it is when doing a single heavy-load activity (lifting). PROLONGED STANDING WHILE SLIGHTLY LEANING FORWARD When completing a task that requires you to lean forward while standing in one place for a long time, place either foot up on a stationary 2- to 4-inch high object to help maintain the best posture. When both feet are on the ground, the low back tends to lose its slight inward curve. If this curve flattens (or becomes too large), then the back and your other joints will experience too much stress, fatigue more quickly, and can cause pain.  RESTING POSITIONS Consider which positions are most painful for you when choosing a resting position. If you have pain with flexion-based activities (sitting, bending, stooping,  squatting), choose a position that allows you to rest in a less flexed posture. You would want to avoid curling into a fetal position on your side. If your pain worsens with extension-based activities (prolonged standing, working overhead), avoid resting in an extended position such as sleeping on your stomach. Most people will find more comfort when they rest with their spine in a more neutral position, neither too rounded nor too arched. Lying on a non-sagging bed on your side with a pillow between your knees, or on your back with a pillow under your knees will often provide some relief. Keep in mind, being in any one position for a prolonged period of time, no matter how correct your posture, can still lead to stiffness. WALKING Walk with an upright posture. Your ears, shoulders, and hips should all line up. OFFICE WORK When working at a desk, create an environment that supports good, upright posture. Without extra support, muscles fatigue and lead to  excessive strain on joints and other tissues. CHAIR:  A chair should be able to slide under your desk when your back makes contact with the back of the chair. This allows you to work closely.  The chair's height should allow your eyes to be level with the upper part of your monitor and your hands to be slightly lower than your elbows.  Body position:  Your feet should make contact with the floor. If this is not possible, use a foot rest.  Keep your ears over your shoulders. This will reduce stress on your neck and low back. Document Released: 12/05/2005 Document Revised: 04/21/2014 Document Reviewed: 03/19/2009 [facility] Patient Information 2015 [place], [place]. This information is not intended to replace advice given to you by your health care provider. Make sure you discuss any questions you have with your health care provider.

## 2015-07-02 NOTE — ED Notes (Signed)
Patient states onset of headache Tuesday afternoon with a "funny feeling" in her right face and right neck sometimes feeling like a pulling sensation. Patient with facial symmetry, able to speak clearly, no facial droop, able to raise eyebrows. No slurred speech noted.

## 2015-07-16 ENCOUNTER — Inpatient Hospital Stay (HOSPITAL_COMMUNITY)
Admission: EM | Admit: 2015-07-16 | Discharge: 2015-07-21 | DRG: 291 | Disposition: A | Discharge status: Home / Self Care | Payer: Medicare Other | Attending: Internal Medicine | Admitting: Internal Medicine

## 2015-07-16 ENCOUNTER — Encounter (HOSPITAL_COMMUNITY): Payer: Self-pay | Admitting: Emergency Medicine

## 2015-07-16 ENCOUNTER — Emergency Department (HOSPITAL_COMMUNITY): Payer: Medicare Other

## 2015-07-16 ENCOUNTER — Emergency Department (INDEPENDENT_AMBULATORY_CARE_PROVIDER_SITE_OTHER)
Admission: EM | Admit: 2015-07-16 | Discharge: 2015-07-16 | Disposition: A | Discharge status: Nursing Home (Includes ICF) | Payer: Medicare Other | Source: Home / Self Care | Attending: Family Medicine | Admitting: Family Medicine

## 2015-07-16 DIAGNOSIS — Z9114 Patient's other noncompliance with medication regimen: Secondary | ICD-10-CM | POA: Diagnosis present

## 2015-07-16 DIAGNOSIS — I10 Essential (primary) hypertension: Secondary | ICD-10-CM | POA: Diagnosis present

## 2015-07-16 DIAGNOSIS — J9620 Acute and chronic respiratory failure, unspecified whether with hypoxia or hypercapnia: Secondary | ICD-10-CM | POA: Diagnosis present

## 2015-07-16 DIAGNOSIS — R079 Chest pain, unspecified: Secondary | ICD-10-CM | POA: Diagnosis not present

## 2015-07-16 DIAGNOSIS — G4733 Obstructive sleep apnea (adult) (pediatric): Secondary | ICD-10-CM | POA: Diagnosis present

## 2015-07-16 DIAGNOSIS — I5033 Acute on chronic diastolic (congestive) heart failure: Secondary | ICD-10-CM | POA: Diagnosis not present

## 2015-07-16 DIAGNOSIS — J45909 Unspecified asthma, uncomplicated: Secondary | ICD-10-CM | POA: Diagnosis present

## 2015-07-16 DIAGNOSIS — R509 Fever, unspecified: Secondary | ICD-10-CM | POA: Diagnosis not present

## 2015-07-16 DIAGNOSIS — I444 Left anterior fascicular block: Secondary | ICD-10-CM | POA: Diagnosis present

## 2015-07-16 DIAGNOSIS — J9601 Acute respiratory failure with hypoxia: Secondary | ICD-10-CM | POA: Diagnosis not present

## 2015-07-16 DIAGNOSIS — R0602 Shortness of breath: Secondary | ICD-10-CM

## 2015-07-16 DIAGNOSIS — Z794 Long term (current) use of insulin: Secondary | ICD-10-CM

## 2015-07-16 DIAGNOSIS — R0902 Hypoxemia: Secondary | ICD-10-CM | POA: Diagnosis not present

## 2015-07-16 DIAGNOSIS — E1151 Type 2 diabetes mellitus with diabetic peripheral angiopathy without gangrene: Secondary | ICD-10-CM | POA: Diagnosis present

## 2015-07-16 DIAGNOSIS — I5023 Acute on chronic systolic (congestive) heart failure: Secondary | ICD-10-CM | POA: Diagnosis not present

## 2015-07-16 DIAGNOSIS — I129 Hypertensive chronic kidney disease with stage 1 through stage 4 chronic kidney disease, or unspecified chronic kidney disease: Secondary | ICD-10-CM | POA: Diagnosis present

## 2015-07-16 DIAGNOSIS — Z6841 Body Mass Index (BMI) 40.0 and over, adult: Secondary | ICD-10-CM | POA: Diagnosis not present

## 2015-07-16 DIAGNOSIS — Z792 Long term (current) use of antibiotics: Secondary | ICD-10-CM

## 2015-07-16 DIAGNOSIS — R0600 Dyspnea, unspecified: Secondary | ICD-10-CM

## 2015-07-16 DIAGNOSIS — R9431 Abnormal electrocardiogram [ECG] [EKG]: Secondary | ICD-10-CM | POA: Diagnosis not present

## 2015-07-16 DIAGNOSIS — J449 Chronic obstructive pulmonary disease, unspecified: Secondary | ICD-10-CM | POA: Diagnosis present

## 2015-07-16 DIAGNOSIS — E785 Hyperlipidemia, unspecified: Secondary | ICD-10-CM | POA: Diagnosis present

## 2015-07-16 DIAGNOSIS — R7989 Other specified abnormal findings of blood chemistry: Secondary | ICD-10-CM

## 2015-07-16 DIAGNOSIS — Z9111 Patient's noncompliance with dietary regimen: Secondary | ICD-10-CM | POA: Diagnosis present

## 2015-07-16 DIAGNOSIS — E1121 Type 2 diabetes mellitus with diabetic nephropathy: Secondary | ICD-10-CM | POA: Diagnosis not present

## 2015-07-16 DIAGNOSIS — Z7982 Long term (current) use of aspirin: Secondary | ICD-10-CM | POA: Diagnosis not present

## 2015-07-16 DIAGNOSIS — D649 Anemia, unspecified: Secondary | ICD-10-CM | POA: Diagnosis present

## 2015-07-16 DIAGNOSIS — E119 Type 2 diabetes mellitus without complications: Secondary | ICD-10-CM

## 2015-07-16 DIAGNOSIS — I272 Other secondary pulmonary hypertension: Secondary | ICD-10-CM | POA: Diagnosis present

## 2015-07-16 DIAGNOSIS — R072 Precordial pain: Secondary | ICD-10-CM

## 2015-07-16 DIAGNOSIS — N183 Chronic kidney disease, stage 3 (moderate): Secondary | ICD-10-CM | POA: Diagnosis not present

## 2015-07-16 DIAGNOSIS — R791 Abnormal coagulation profile: Secondary | ICD-10-CM | POA: Diagnosis not present

## 2015-07-16 DIAGNOSIS — J441 Chronic obstructive pulmonary disease with (acute) exacerbation: Secondary | ICD-10-CM | POA: Diagnosis present

## 2015-07-16 DIAGNOSIS — Z7952 Long term (current) use of systemic steroids: Secondary | ICD-10-CM

## 2015-07-16 DIAGNOSIS — Z87891 Personal history of nicotine dependence: Secondary | ICD-10-CM | POA: Diagnosis not present

## 2015-07-16 DIAGNOSIS — I248 Other forms of acute ischemic heart disease: Secondary | ICD-10-CM | POA: Diagnosis present

## 2015-07-16 DIAGNOSIS — M109 Gout, unspecified: Secondary | ICD-10-CM | POA: Diagnosis present

## 2015-07-16 DIAGNOSIS — E662 Morbid (severe) obesity with alveolar hypoventilation: Secondary | ICD-10-CM | POA: Diagnosis present

## 2015-07-16 DIAGNOSIS — E1122 Type 2 diabetes mellitus with diabetic chronic kidney disease: Secondary | ICD-10-CM | POA: Diagnosis present

## 2015-07-16 DIAGNOSIS — N184 Chronic kidney disease, stage 4 (severe): Secondary | ICD-10-CM | POA: Diagnosis present

## 2015-07-16 LAB — I-STAT ARTERIAL BLOOD GAS, ED
ACID-BASE EXCESS: 7 mmol/L — AB (ref 0.0–2.0)
BICARBONATE: 34.2 meq/L — AB (ref 20.0–24.0)
O2 SAT: 99 %
PCO2 ART: 61.9 mmHg — AB (ref 35.0–45.0)
PH ART: 7.351 (ref 7.350–7.450)
TCO2: 36 mmol/L (ref 0–100)
pO2, Arterial: 177 mmHg — ABNORMAL HIGH (ref 80.0–100.0)

## 2015-07-16 LAB — COMPREHENSIVE METABOLIC PANEL
ALT: 15 U/L (ref 14–54)
ANION GAP: 10 (ref 5–15)
AST: 18 U/L (ref 15–41)
Albumin: 3.3 g/dL — ABNORMAL LOW (ref 3.5–5.0)
Alkaline Phosphatase: 68 U/L (ref 38–126)
BILIRUBIN TOTAL: 0.7 mg/dL (ref 0.3–1.2)
BUN: 43 mg/dL — AB (ref 6–20)
CALCIUM: 9.5 mg/dL (ref 8.9–10.3)
CO2: 29 mmol/L (ref 22–32)
CREATININE: 2.55 mg/dL — AB (ref 0.44–1.00)
Chloride: 102 mmol/L (ref 101–111)
GFR, EST AFRICAN AMERICAN: 23 mL/min — AB (ref >60)
GFR, EST NON AFRICAN AMERICAN: 20 mL/min — AB (ref >60)
Glucose, Bld: 87 mg/dL (ref 65–99)
POTASSIUM: 3.9 mmol/L (ref 3.5–5.1)
Sodium: 141 mmol/L (ref 135–145)
Total Protein: 7.4 g/dL (ref 6.5–8.1)

## 2015-07-16 LAB — I-STAT CHEM 8, ED
BUN: 44 mg/dL — ABNORMAL HIGH (ref 6–20)
Calcium, Ion: 1.19 mmol/L (ref 1.12–1.23)
Chloride: 101 mmol/L (ref 101–111)
Creatinine, Ser: 2.4 mg/dL — ABNORMAL HIGH (ref 0.44–1.00)
Glucose, Bld: 87 mg/dL (ref 65–99)
HEMATOCRIT: 37 % (ref 36.0–46.0)
HEMOGLOBIN: 12.6 g/dL (ref 12.0–15.0)
Potassium: 3.8 mmol/L (ref 3.5–5.1)
Sodium: 143 mmol/L (ref 135–145)
TCO2: 27 mmol/L (ref 0–100)

## 2015-07-16 LAB — CBC WITH DIFFERENTIAL/PLATELET
Basophils Absolute: 0 10*3/uL (ref 0.0–0.1)
Basophils Relative: 0 % (ref 0–1)
EOS ABS: 0.1 10*3/uL (ref 0.0–0.7)
Eosinophils Relative: 1 % (ref 0–5)
HCT: 35 % — ABNORMAL LOW (ref 36.0–46.0)
Hemoglobin: 10.3 g/dL — ABNORMAL LOW (ref 12.0–15.0)
Lymphocytes Relative: 25 % (ref 12–46)
Lymphs Abs: 2.4 10*3/uL (ref 0.7–4.0)
MCH: 25.1 pg — ABNORMAL LOW (ref 26.0–34.0)
MCHC: 29.4 g/dL — ABNORMAL LOW (ref 30.0–36.0)
MCV: 85.2 fL (ref 78.0–100.0)
Monocytes Absolute: 0.6 10*3/uL (ref 0.1–1.0)
Monocytes Relative: 6 % (ref 3–12)
Neutro Abs: 6.8 10*3/uL (ref 1.7–7.7)
Neutrophils Relative %: 68 % (ref 43–77)
PLATELETS: 243 10*3/uL (ref 150–400)
RBC: 4.11 MIL/uL (ref 3.87–5.11)
RDW: 16.4 % — AB (ref 11.5–15.5)
WBC: 9.9 10*3/uL (ref 4.0–10.5)

## 2015-07-16 LAB — D-DIMER, QUANTITATIVE (NOT AT ARMC): D DIMER QUANT: 1.11 ug{FEU}/mL — AB (ref 0.00–0.48)

## 2015-07-16 LAB — BRAIN NATRIURETIC PEPTIDE: B Natriuretic Peptide: 66.2 pg/mL (ref 0.0–100.0)

## 2015-07-16 LAB — I-STAT TROPONIN, ED: Troponin i, poc: 0.01 ng/mL (ref 0.00–0.08)

## 2015-07-16 MED ORDER — ALBUTEROL SULFATE (2.5 MG/3ML) 0.083% IN NEBU
5.0000 mg | INHALATION_SOLUTION | Freq: Once | RESPIRATORY_TRACT | Status: AC
  Administered 2015-07-16: 5 mg via RESPIRATORY_TRACT
  Filled 2015-07-16: qty 6

## 2015-07-16 MED ORDER — IPRATROPIUM BROMIDE 0.02 % IN SOLN
0.5000 mg | Freq: Once | RESPIRATORY_TRACT | Status: AC
  Administered 2015-07-16: 0.5 mg via RESPIRATORY_TRACT
  Filled 2015-07-16: qty 2.5

## 2015-07-16 MED ORDER — ASPIRIN 81 MG PO CHEW
CHEWABLE_TABLET | ORAL | Status: AC
  Filled 2015-07-16: qty 4

## 2015-07-16 MED ORDER — NITROGLYCERIN 0.4 MG SL SUBL
SUBLINGUAL_TABLET | SUBLINGUAL | Status: AC
  Filled 2015-07-16: qty 1

## 2015-07-16 MED ORDER — SODIUM CHLORIDE 0.9 % IV SOLN
Freq: Once | INTRAVENOUS | Status: AC
  Administered 2015-07-16: 20:00:00 via INTRAVENOUS

## 2015-07-16 MED ORDER — NITROGLYCERIN 0.4 MG SL SUBL
0.4000 mg | SUBLINGUAL_TABLET | SUBLINGUAL | Status: DC | PRN
  Administered 2015-07-16: 0.4 mg via SUBLINGUAL

## 2015-07-16 MED ORDER — ASPIRIN 81 MG PO CHEW
324.0000 mg | CHEWABLE_TABLET | Freq: Once | ORAL | Status: AC
  Administered 2015-07-16: 324 mg via ORAL

## 2015-07-16 NOTE — ED Provider Notes (Signed)
CSN: [id number]     Arrival date & time 07/16/15  1715 History   First MD Initiated Contact with Patient 07/16/15 1817     Chief Complaint  Patient presents with  . Chest Pain  . Shortness of Breath   (Consider location/radiation/quality/duration/timing/severity/associated sxs/prior Treatment) HPI Comments: 54 year old severely and morbidly obese female presents to the urgent care with upper mid chest pain described as tightness associated with shortness of breath. Over the past couple days her primary complaint was that of headache and drainage in the nose. Sometimes she feels like she is going to pass out. Her medical history is significant for hypertension, congestive heart failure, COPD, diabetes mellitus, asthma, kidney disease, peripheral vascular disease and anemia. At the time of the exam her blood pressure is 190/98 and her pulse oximetry is 90%. Temperature is 99.2 degrees.   Past Medical History  Diagnosis Date  . Hypertension   . Heart failure   . Asthma   . Diabetes mellitus   . Hyperlipidemia   . Kidney disease   . Pinched nerve   . Morbid obesity   . COPD (chronic obstructive pulmonary disease)   . Sleep apnea   . Peripheral vascular disease   . Arthritis Dec. 2014    Gout  . Anemia   . CHF (congestive heart failure)    Past Surgical History  Procedure Laterality Date  . Ovary surgery      Left  . Hand surgery      right  . Atherectomy and angioplasty  10/18/2011    left posterior tibial artery  . Toe amputation  Sept. 25,2012    Left 4th and 5th toes   Family History  Problem Relation Age of Onset  . Lung cancer Mother   . Clotting disorder Mother   . Heart disease Mother   . Cancer Mother     Lung  . Deep vein thrombosis Mother   . Diabetes Mother   . Hypertension Mother   . Varicose Veins Mother   . Cancer Father   . Clotting disorder Sister   . Heart attack Sister   . Diabetes Sister   . Clotting disorder Brother   . Deep vein thrombosis  Brother   . Diabetes Brother   . Heart disease Sister   . Heart disease Brother    History  Substance Use Topics  . Smoking status: Former Smoker    Types: Cigarettes  . 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  . Alcohol Use: No   OB History    No data available     Review of Systems  Constitutional: Positive for activity change, appetite change and fatigue.  HENT: Positive for sinus pressure.   Respiratory: Positive for chest tightness and shortness of breath.   Cardiovascular: Positive for chest pain and leg swelling.  Genitourinary: Negative.   Skin: Negative for rash.  Neurological: Positive for headaches. Negative for speech difficulty.  Psychiatric/Behavioral: Positive for sleep disturbance.    Allergies  Omnipaque  Home Medications   Prior to Admission medications   Medication Sig Start Date End Date Taking? Authorizing Provider  aspirin 81 MG tablet Take 81 mg by mouth daily.     Yes Historical Provider, MD  furosemide (LASIX) 80 MG tablet Take 80 mg by mouth 2 (two) times daily.   Yes Historical Provider, MD  insulin aspart (NOVOLOG) 100 UNIT/ML injection Inject 20 Units into the skin 3 (three)  times daily with meals.    Yes Historical Provider, MD  insulin glargine (LANTUS) 100 UNIT/ML injection Inject 70 Units into the skin at bedtime.    Yes Historical Provider, MD  metoprolol (LOPRESSOR) 50 MG tablet Take 50 mg by mouth 2 (two) times daily.     Yes Historical Provider, MD  albuterol (PROVENTIL HFA;VENTOLIN HFA) 108 (90 BASE) MCG/ACT inhaler Inhale 1-2 puffs into the lungs every 6 (six) hours as needed for wheezing. 07/25/13   Harden Mo, MD  amLODipine (NORVASC) 10 MG tablet Take 10 mg by mouth daily.      Historical Provider, MD  amoxicillin-clavulanate (AUGMENTIN) 875-125 MG per tablet Take 1 tablet by mouth every 12 (twelve) hours. Patient not taking: Reported on 01/29/2015 06/12/14   Gregor Hams, MD   budesonide-formoterol [facility]) 160-4.5 MCG/ACT inhaler Inhale 2 puffs into the lungs 2 (two) times daily. 07/25/13   Harden Mo, MD  cyclobenzaprine (FLEXERIL) 10 MG tablet Take 10 mg by mouth 3 (three) times daily as needed for muscle spasms.    Historical Provider, MD  gabapentin (NEURONTIN) 100 MG capsule Take 100 mg by mouth at bedtime.    Historical Provider, MD  hydrALAZINE (APRESOLINE) 25 MG tablet Take 25 mg by mouth daily.     Historical Provider, MD  HYDROcodone-acetaminophen (NORCO) 10-325 MG per tablet Take 1 tablet by mouth every 6 (six) hours as needed for moderate pain.     Historical Provider, MD  ipratropium (ATROVENT) 0.06 % nasal spray Place 2 sprays into both nostrils 4 (four) times daily. Patient not taking: Reported on 01/29/2015 06/12/14   Gregor Hams, MD  isosorbide dinitrate (ISORDIL) 30 MG tablet Take 30 mg by mouth 2 (two) times daily.      Historical Provider, MD  levofloxacin (LEVAQUIN) 750 MG tablet Take 1 tablet (750 mg total) by mouth daily. X 7 days Patient not taking: Reported on 01/29/2015 11/22/14   Merrily Pew, MD  ondansetron (ZOFRAN ODT) 4 MG disintegrating tablet Take 1 tablet (4 mg total) by mouth every 8 (eight) hours as needed for nausea or vomiting. Patient not taking: Reported on 01/29/2015 12/12/13   Harvie Heck, PA-C  predniSONE (DELTASONE) 20 MG tablet Take 2 tablets (40 mg total) by mouth daily with breakfast. 2 tabs po daily x 3 days Patient not taking: Reported on 01/29/2015 11/22/14   Merrily Pew, MD  promethazine (PHENERGAN) 25 MG suppository Place 1 suppository (25 mg total) rectally every 6 (six) hours as needed for nausea or vomiting. Patient not taking: Reported on 01/29/2015 12/12/13   Harvie Heck, PA-C  rosuvastatin (CRESTOR) 40 MG tablet Take 40 mg by mouth every evening.     Historical Provider, MD  traMADol-acetaminophen (ULTRACET) 37.5-325 MG per tablet Take 1 tablet by mouth every 6 (six) hours as needed for pain. 09/20/12   Nicole  Pisciotta, PA-C   BP 190/98 mmHg  Pulse 78  Temp(Src) 99.2 F (37.3 C) (Oral)  Resp 16  SpO2 90%  LMP 09/05/2012 Physical Exam  Constitutional: She is oriented to person, place, and time.  Morbidly obese female sitting in the exam chair with mild increase in respiratory effort.., Temperature is noted at 99.2 and an SPO2 is 90%. She is talking in complete sentences. Awake alert and oriented.  HENT:  Mouth/Throat: Oropharynx is clear and moist.  Eyes: Conjunctivae and EOM are normal.  Neck: Normal range of motion. Neck supple.  Cardiovascular: Normal rate and regular rhythm.   Heart sounds are distant  due to body habitus. S1 and S2.  Pulmonary/Chest: Effort normal and breath sounds normal. No respiratory distress.  Diminished  air movement diffusely. Rare expiratory wheeze. Breath sounds are distant.  Musculoskeletal: Normal range of motion. She exhibits edema.  3-4+ edema to the bilateral lower extremities 2+ pitting  Neurological: She is alert and oriented to person, place, and time. She exhibits normal muscle tone.  Skin: Skin is warm and dry.  Psychiatric: She has a normal mood and affect.  Nursing note and vitals reviewed.   ED Course  Procedures (including critical care time) Labs Review Labs Reviewed - No data to display  Imaging Review No results found. ED ECG REPORT   Date: 07/16/2015  Rate: 79  Rhythm: normal sinus rhythm  QRS Axis: normal  Intervals: QT prolonged  ST/T Wave abnormalities: ST depressions inferiorly  Conduction Disutrbances:none  Narrative Interpretation:   Old EKG Reviewed: changes noted  I have personally reviewed the EKG tracing and agree with the computerized printout as noted.   MDM   1. Hypoxemia   2. Precordial pain   3. Dyspnea   4. Chronic obstructive pulmonary disease, unspecified COPD, unspecified chronic bronchitis type   5. Acute on chronic systolic congestive heart failure   6. Abnormal EKG    Transfer to ED via [facility] with IV, O2, monitor, ASA 324mg  po, NTG 0.4mg  sl    Janne Napoleon, NP 07/16/15 1913

## 2015-07-16 NOTE — ED Notes (Signed)
[facility] called and wanted to confirm address... Dispatcher reports they went to the old location on [address]??? Notified them we have not been there in over 6 months +++

## 2015-07-16 NOTE — ED Notes (Signed)
Pt reports CP associated w/SOB and HA onset Monday.... Started to feel better today but "feels like she's going to pass out" Went to [facility] on 7/14 for similar sx.... Hx of CHF and COPD Alert, no signs of acute distress.

## 2015-07-16 NOTE — ED Notes (Signed)
Pt brought to ED by [facility] form [facility] for follow up on SOB and left side CP, pt states is tightness on her left side of the chest going to her back ant to her left arm 8/10. Pt states she feels nauseated denies fevers or chills and vomiting. One nitro  And 324 mg ASA given at the urgent care. BP 175/56, HR 87, O2 sat 99 on 3L

## 2015-07-16 NOTE — ED Provider Notes (Signed)
CSN: [id number]     Arrival date & time 07/16/15  2019 History   First MD Initiated Contact with Patient 07/16/15 2028     Chief Complaint  Patient presents with  . Chest Pain  . Shortness of Breath     (Consider location/radiation/quality/duration/timing/severity/associated sxs/prior Treatment) HPI Comments: Patient is a 54 year old female with a past medical history significant for CHF, hypertension, diabetes, kidney disease, morbid obesity, COPD and asthma who presents from urgent care with chest pain and shortness of breath. Patient states for the last 1 month she's had worsening exertional dyspnea. In the last 1 week she has noticed new swelling in her bilateral lower extremities right greater than left, worsening cough with white phlegm, chest pain that substernal and radiates into the left side of her chest with exertion and headaches when she wakes up in the morning despite wearing her CPAP mask. She has been using her inhalers without improvement. She does not normally oxygen at home but has attained of oxygen at home and so on Tuesday she went oxygen and felt much better. She did not wear it yesterday and was feeling much worse. She denies any recent medication changes. She denies any prior history of MI or coronary artery disease. She states 2 years ago she saw a cardiologist and had a full workup and was told she was good. Daughter states she followed up with a cardiologist earlier this year and to be okay at that time.  Patient is a 54 y.o. female presenting with chest pain and shortness of breath. The history is provided by the patient.  Chest Pain Associated symptoms: shortness of breath   Shortness of Breath Associated symptoms: chest pain     Past Medical History  Diagnosis Date  . Hypertension   . Heart failure   . Asthma   . Diabetes mellitus   . Hyperlipidemia   . Kidney disease   . Pinched nerve   . Morbid obesity   . COPD (chronic obstructive pulmonary disease)    . Sleep apnea   . Peripheral vascular disease   . Arthritis Dec. 2014    Gout  . Anemia   . CHF (congestive heart failure)    Past Surgical History  Procedure Laterality Date  . Ovary surgery      Left  . Hand surgery      right  . Atherectomy and angioplasty  10/18/2011    left posterior tibial artery  . Toe amputation  Sept. 25,2012    Left 4th and 5th toes   Family History  Problem Relation Age of Onset  . Lung cancer Mother   . Clotting disorder Mother   . Heart disease Mother   . Cancer Mother     Lung  . Deep vein thrombosis Mother   . Diabetes Mother   . Hypertension Mother   . Varicose Veins Mother   . Cancer Father   . Clotting disorder Sister   . Heart attack Sister   . Diabetes Sister   . Clotting disorder Brother   . Deep vein thrombosis Brother   . Diabetes Brother   . Heart disease Sister   . Heart disease Brother    History  Substance Use Topics  . Smoking status: Former Smoker    Types: Cigarettes  . 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  . Alcohol Use: No   OB History    No data available  Review of Systems  Respiratory: Positive for shortness of breath.   Cardiovascular: Positive for chest pain.  All other systems reviewed and are negative.     Allergies  Omnipaque  Home Medications   Prior to Admission medications   Medication Sig Start Date End Date Taking? Authorizing Provider  albuterol (PROVENTIL HFA;VENTOLIN HFA) 108 (90 BASE) MCG/ACT inhaler Inhale 1-2 puffs into the lungs every 6 (six) hours as needed for wheezing. 07/25/13   Harden Mo, MD  amLODipine (NORVASC) 10 MG tablet Take 10 mg by mouth daily.      Historical Provider, MD  amoxicillin-clavulanate (AUGMENTIN) 875-125 MG per tablet Take 1 tablet by mouth every 12 (twelve) hours. Patient not taking: Reported on 01/29/2015 06/12/14   Gregor Hams, MD  aspirin 81 MG tablet Take 81 mg by mouth daily.      Historical  Provider, MD  budesonide-formoterol (SYMBICORT) 160-4.5 MCG/ACT inhaler Inhale 2 puffs into the lungs 2 (two) times daily. 07/25/13   Harden Mo, MD  cyclobenzaprine (FLEXERIL) 10 MG tablet Take 10 mg by mouth 3 (three) times daily as needed for muscle spasms.    Historical Provider, MD  furosemide (LASIX) 80 MG tablet Take 80 mg by mouth 2 (two) times daily.    Historical Provider, MD  gabapentin (NEURONTIN) 100 MG capsule Take 100 mg by mouth at bedtime.    Historical Provider, MD  hydrALAZINE (APRESOLINE) 25 MG tablet Take 25 mg by mouth daily.     Historical Provider, MD  HYDROcodone-acetaminophen (NORCO) 10-325 MG per tablet Take 1 tablet by mouth every 6 (six) hours as needed for moderate pain.     Historical Provider, MD  insulin aspart (NOVOLOG) 100 UNIT/ML injection Inject 20 Units into the skin 3 (three) times daily with meals.     Historical Provider, MD  insulin glargine (LANTUS) 100 UNIT/ML injection Inject 70 Units into the skin at bedtime.     Historical Provider, MD  ipratropium (ATROVENT) 0.06 % nasal spray Place 2 sprays into both nostrils 4 (four) times daily. Patient not taking: Reported on 01/29/2015 06/12/14   Gregor Hams, MD  isosorbide dinitrate (ISORDIL) 30 MG tablet Take 30 mg by mouth 2 (two) times daily.      Historical Provider, MD  levofloxacin (LEVAQUIN) 750 MG tablet Take 1 tablet (750 mg total) by mouth daily. X 7 days Patient not taking: Reported on 01/29/2015 11/22/14   Merrily Pew, MD  metoprolol (LOPRESSOR) 50 MG tablet Take 50 mg by mouth 2 (two) times daily.      Historical Provider, MD  ondansetron (ZOFRAN ODT) 4 MG disintegrating tablet Take 1 tablet (4 mg total) by mouth every 8 (eight) hours as needed for nausea or vomiting. Patient not taking: Reported on 01/29/2015 12/12/13   Harvie Heck, PA-C  predniSONE (DELTASONE) 20 MG tablet Take 2 tablets (40 mg total) by mouth daily with breakfast. 2 tabs po daily x 3 days Patient not taking: Reported on  01/29/2015 11/22/14   Merrily Pew, MD  promethazine (PHENERGAN) 25 MG suppository Place 1 suppository (25 mg total) rectally every 6 (six) hours as needed for nausea or vomiting. Patient not taking: Reported on 01/29/2015 12/12/13   Harvie Heck, PA-C  rosuvastatin (CRESTOR) 40 MG tablet Take 40 mg by mouth every evening.     Historical Provider, MD  traMADol-acetaminophen (ULTRACET) 37.5-325 MG per tablet Take 1 tablet by mouth every 6 (six) hours as needed for pain. 09/20/12   Monico Blitz, PA-C  BP 160/58 mmHg  Pulse 75  Temp(Src) 98.5 F (36.9 C) (Oral)  Resp 19  Ht 5\' 3"  (1.6 m)  Wt 342 lb (155.13 kg)  BMI 60.60 kg/m2  SpO2 97%  LMP 09/05/2012 Physical Exam  Constitutional: She is oriented to person, place, and time. She appears well-developed and well-nourished. No distress.  Morbidly obese  HENT:  Head: Normocephalic and atraumatic.  Mouth/Throat: Oropharynx is clear and moist.  Eyes: Conjunctivae and EOM are normal. Pupils are equal, round, and reactive to light.  Neck: Normal range of motion. Neck supple.  Cardiovascular: Normal rate, regular rhythm and intact distal pulses.   No murmur heard. Pulmonary/Chest: Effort normal. No respiratory distress. She has decreased breath sounds. She has no wheezes. She has no rales.  Abdominal: Soft. She exhibits no distension. There is no tenderness. There is no rebound and no guarding.  Musculoskeletal: Normal range of motion. She exhibits edema. She exhibits no tenderness.  3+ pitting edema in the right lower extremity. 2+ pitting edema in the left lower extremity. No calf tenderness  Neurological: She is alert and oriented to person, place, and time.  Skin: Skin is warm and dry. No rash noted. No erythema.  Psychiatric: She has a normal mood and affect. Her behavior is normal.  Nursing note and vitals reviewed.   ED Course  Procedures (including critical care time) Labs Review Labs Reviewed  CBC WITH DIFFERENTIAL/PLATELET -  Abnormal; Notable for the following:    Hemoglobin 10.3 (*)    HCT 35.0 (*)    MCH 25.1 (*)    MCHC 29.4 (*)    RDW 16.4 (*)    All other components within normal limits  D-DIMER, QUANTITATIVE (NOT AT [facility]) - Abnormal; Notable for the following:    D-Dimer, Quant 1.11 (*)    All other components within normal limits  COMPREHENSIVE METABOLIC PANEL - Abnormal; Notable for the following:    BUN 43 (*)    Creatinine, Ser 2.55 (*)    Albumin 3.3 (*)    GFR calc non Af Amer 20 (*)    GFR calc Af Amer 23 (*)    All other components within normal limits  I-STAT CHEM 8, ED - Abnormal; Notable for the following:    BUN 44 (*)    Creatinine, Ser 2.40 (*)    All other components within normal limits  I-STAT ARTERIAL BLOOD GAS, ED - Abnormal; Notable for the following:    pCO2 arterial 61.9 (*)    pO2, Arterial 177.0 (*)    Bicarbonate 34.2 (*)    Acid-Base Excess 7.0 (*)    All other components within normal limits  BRAIN NATRIURETIC PEPTIDE  I-STAT TROPOININ, ED    Imaging Review Dg Chest 2 View  07/16/2015   CLINICAL DATA:  Chronic shortness of breath, fluid retention, hypertension and diabetes. History COPD and CHF  EXAM: CHEST  2 VIEW  COMPARISON:  01/14/2015  FINDINGS: Limited exam because of obese body habitus. Stable mild cardiac enlargement with vascular and interstitial prominence. No definite superimposed edema, focal pneumonia, collapse or consolidation. No large effusion or pneumothorax. Trachea midline.  IMPRESSION: Stable chest exam. No superimposed acute process by plain radiography   Electronically Signed   By: Jerilynn Mages.  Shick M.D.   On: 07/16/2015 22:01     EKG Interpretation   Date/Time:  Thursday July 16 2015 20:40:34 EDT Ventricular Rate:  75 PR Interval:  147 QRS Duration: 113 QT Interval:  419 QTC Calculation: 468 R Axis:   -  76 Text Interpretation:  Sinus rhythm Left anterior fascicular block LVH with  secondary repolarization abnormality Anterior Q waves, possibly  due to LVH  No significant change since last tracing Confirmed by Maryan Rued  MD,  Loree Fee ([id number]) on 07/16/2015 9:08:22 PM      MDM   Final diagnoses:  SOB (shortness of breath)  Chest pain on exertion  Elevated d-dimer    Patient with a history of multiple medical problems including COPD, diabetes, CHF, kidney disease and hypertension who presents from urgent care with worsening exertional dyspnea and chest pain. Patient is also noted distal edema. She does not wear oxygen chronically however symptoms are improved with oxygen. She's been using inhalers at home without improvement. Also notes new cough and congestion but no fevers.  Patient has distal edema bilaterally and decreased breath sounds which is most likely related to chest wall cavity size.  She's in no acute distress at this time.  Given patient's medical history and history today concern for possible COPD exacerbation versus community-acquired pneumonia versus PE versus CHF versus acute coronary syndrome.  CBC, BNP, CMP, troponin, d-dimer, chest x-ray, EKG pending patient given albuterol and Atrovent to see if that improves her shortness of breath.  10:32 PM Lab findings show an ABG was CO2 retention with a CO2 of 61 which is most likely the reason for her worsening morning headaches. CBC within normal limits. BNP without elevation. CMP with chronic renal insufficiency unchanged from prior 2.4. Troponin negative. D-dimer elevated at 1.11 however given patient's chronic kidney disease she will need a VQ scan especially given multiple family members have had PEs in the past. Chest x-ray without acute findings.  After albuterol and Atrovent patient feels that she is breathing slightly better and also feels that the oxygen helped some as well. Will admit for further care having cardiac rule out as well as PE rule out. Also a possibility that this could all be from her COPD and pulmonary hypertension.  Blanchie Dessert, MD 07/16/15  [id number]

## 2015-07-17 ENCOUNTER — Inpatient Hospital Stay (HOSPITAL_COMMUNITY): Payer: Medicare Other

## 2015-07-17 ENCOUNTER — Encounter (HOSPITAL_COMMUNITY): Payer: Self-pay | Admitting: Internal Medicine

## 2015-07-17 DIAGNOSIS — J9620 Acute and chronic respiratory failure, unspecified whether with hypoxia or hypercapnia: Secondary | ICD-10-CM | POA: Diagnosis present

## 2015-07-17 DIAGNOSIS — N184 Chronic kidney disease, stage 4 (severe): Secondary | ICD-10-CM | POA: Diagnosis present

## 2015-07-17 DIAGNOSIS — D649 Anemia, unspecified: Secondary | ICD-10-CM | POA: Diagnosis present

## 2015-07-17 DIAGNOSIS — Z794 Long term (current) use of insulin: Secondary | ICD-10-CM

## 2015-07-17 DIAGNOSIS — I5033 Acute on chronic diastolic (congestive) heart failure: Principal | ICD-10-CM

## 2015-07-17 DIAGNOSIS — I10 Essential (primary) hypertension: Secondary | ICD-10-CM | POA: Diagnosis present

## 2015-07-17 DIAGNOSIS — E119 Type 2 diabetes mellitus without complications: Secondary | ICD-10-CM

## 2015-07-17 DIAGNOSIS — J9601 Acute respiratory failure with hypoxia: Secondary | ICD-10-CM

## 2015-07-17 DIAGNOSIS — R0600 Dyspnea, unspecified: Secondary | ICD-10-CM

## 2015-07-17 LAB — CBC WITH DIFFERENTIAL/PLATELET
BASOS PCT: 0 % (ref 0–1)
Basophils Absolute: 0 10*3/uL (ref 0.0–0.1)
EOS ABS: 0.1 10*3/uL (ref 0.0–0.7)
EOS PCT: 1 % (ref 0–5)
HEMATOCRIT: 35 % — AB (ref 36.0–46.0)
Hemoglobin: 10.1 g/dL — ABNORMAL LOW (ref 12.0–15.0)
Lymphocytes Relative: 19 % (ref 12–46)
Lymphs Abs: 2 10*3/uL (ref 0.7–4.0)
MCH: 25 pg — AB (ref 26.0–34.0)
MCHC: 28.9 g/dL — ABNORMAL LOW (ref 30.0–36.0)
MCV: 86.6 fL (ref 78.0–100.0)
Monocytes Absolute: 0.4 10*3/uL (ref 0.1–1.0)
Monocytes Relative: 4 % (ref 3–12)
Neutro Abs: 7.9 10*3/uL — ABNORMAL HIGH (ref 1.7–7.7)
Neutrophils Relative %: 76 % (ref 43–77)
Platelets: 238 10*3/uL (ref 150–400)
RBC: 4.04 MIL/uL (ref 3.87–5.11)
RDW: 16.3 % — AB (ref 11.5–15.5)
WBC: 10.5 10*3/uL (ref 4.0–10.5)

## 2015-07-17 LAB — CBC
HCT: 33.6 % — ABNORMAL LOW (ref 36.0–46.0)
Hemoglobin: 9.9 g/dL — ABNORMAL LOW (ref 12.0–15.0)
MCH: 25.3 pg — ABNORMAL LOW (ref 26.0–34.0)
MCHC: 29.5 g/dL — AB (ref 30.0–36.0)
MCV: 85.9 fL (ref 78.0–100.0)
Platelets: 224 10*3/uL (ref 150–400)
RBC: 3.91 MIL/uL (ref 3.87–5.11)
RDW: 16.6 % — AB (ref 11.5–15.5)
WBC: 10.6 10*3/uL — ABNORMAL HIGH (ref 4.0–10.5)

## 2015-07-17 LAB — GLUCOSE, CAPILLARY
GLUCOSE-CAPILLARY: 148 mg/dL — AB (ref 65–99)
Glucose-Capillary: 163 mg/dL — ABNORMAL HIGH (ref 65–99)
Glucose-Capillary: 144 mg/dL — ABNORMAL HIGH (ref 65–99)
Glucose-Capillary: 96 mg/dL (ref 65–99)
Glucose-Capillary: 160 mg/dL — ABNORMAL HIGH (ref 65–99)
Glucose-Capillary: 160 mg/dL — ABNORMAL HIGH (ref 65–99)

## 2015-07-17 LAB — TROPONIN I

## 2015-07-17 LAB — COMPREHENSIVE METABOLIC PANEL
ALBUMIN: 3 g/dL — AB (ref 3.5–5.0)
ALT: 13 U/L — ABNORMAL LOW (ref 14–54)
ANION GAP: 9 (ref 5–15)
AST: 15 U/L (ref 15–41)
Alkaline Phosphatase: 66 U/L (ref 38–126)
BUN: 44 mg/dL — AB (ref 6–20)
CALCIUM: 9.6 mg/dL (ref 8.9–10.3)
CO2: 32 mmol/L (ref 22–32)
Chloride: 102 mmol/L (ref 101–111)
Creatinine, Ser: 2.51 mg/dL — ABNORMAL HIGH (ref 0.44–1.00)
GFR calc Af Amer: 24 mL/min — ABNORMAL LOW (ref >60)
GFR, EST NON AFRICAN AMERICAN: 20 mL/min — AB (ref >60)
Glucose, Bld: 159 mg/dL — ABNORMAL HIGH (ref 65–99)
Potassium: 4.6 mmol/L (ref 3.5–5.1)
Sodium: 143 mmol/L (ref 135–145)
Total Bilirubin: 0.5 mg/dL (ref 0.3–1.2)
Total Protein: 6.8 g/dL (ref 6.5–8.1)

## 2015-07-17 LAB — CREATININE, SERUM
Creatinine, Ser: 2.61 mg/dL — ABNORMAL HIGH (ref 0.44–1.00)
GFR calc Af Amer: 22 mL/min — ABNORMAL LOW (ref >60)
GFR calc non Af Amer: 19 mL/min — ABNORMAL LOW (ref >60)

## 2015-07-17 MED ORDER — TECHNETIUM TO 99M ALBUMIN AGGREGATED
6.0000 | Freq: Once | INTRAVENOUS | Status: AC | PRN
  Administered 2015-07-17: 6 via INTRAVENOUS

## 2015-07-17 MED ORDER — INSULIN ASPART 100 UNIT/ML ~~LOC~~ SOLN
0.0000 [IU] | Freq: Three times a day (TID) | SUBCUTANEOUS | Status: DC
  Administered 2015-07-17: 2 [IU] via SUBCUTANEOUS
  Administered 2015-07-17: 1 [IU] via SUBCUTANEOUS
  Administered 2015-07-18: 2 [IU] via SUBCUTANEOUS
  Administered 2015-07-18: 3 [IU] via SUBCUTANEOUS
  Administered 2015-07-19 – 2015-07-20 (×4): 2 [IU] via SUBCUTANEOUS
  Administered 2015-07-21: 1 [IU] via SUBCUTANEOUS

## 2015-07-17 MED ORDER — GABAPENTIN 100 MG PO CAPS
100.0000 mg | ORAL_CAPSULE | Freq: Every day | ORAL | Status: DC
  Administered 2015-07-17 – 2015-07-20 (×4): 100 mg via ORAL
  Filled 2015-07-17 (×6): qty 1

## 2015-07-17 MED ORDER — AMLODIPINE BESYLATE 10 MG PO TABS
10.0000 mg | ORAL_TABLET | Freq: Every day | ORAL | Status: DC
  Administered 2015-07-17 – 2015-07-21 (×5): 10 mg via ORAL
  Filled 2015-07-17 (×5): qty 1

## 2015-07-17 MED ORDER — HYDROCODONE-ACETAMINOPHEN 10-325 MG PO TABS
1.0000 | ORAL_TABLET | Freq: Four times a day (QID) | ORAL | Status: DC | PRN
  Administered 2015-07-17 – 2015-07-19 (×2): 1 via ORAL
  Filled 2015-07-17 (×2): qty 1

## 2015-07-17 MED ORDER — ACETAMINOPHEN 325 MG PO TABS
650.0000 mg | ORAL_TABLET | Freq: Four times a day (QID) | ORAL | Status: DC | PRN
  Administered 2015-07-17 – 2015-07-20 (×4): 650 mg via ORAL
  Filled 2015-07-17 (×4): qty 2

## 2015-07-17 MED ORDER — TECHNETIUM TC 99M DIETHYLENETRIAME-PENTAACETIC ACID: 40.0000 | Freq: Once | INTRAVENOUS | Status: AC | PRN

## 2015-07-17 MED ORDER — ROSUVASTATIN CALCIUM 40 MG PO TABS
40.0000 mg | ORAL_TABLET | Freq: Every evening | ORAL | Status: DC
  Administered 2015-07-17 – 2015-07-20 (×4): 40 mg via ORAL
  Filled 2015-07-17 (×6): qty 1

## 2015-07-17 MED ORDER — ACETAMINOPHEN 650 MG RE SUPP: 650.0000 mg | Freq: Four times a day (QID) | RECTAL | Status: DC | PRN

## 2015-07-17 MED ORDER — HYDRALAZINE HCL 25 MG PO TABS
25.0000 mg | ORAL_TABLET | Freq: Every day | ORAL | Status: DC
  Administered 2015-07-17 – 2015-07-18 (×2): 25 mg via ORAL
  Filled 2015-07-17 (×2): qty 1

## 2015-07-17 MED ORDER — ENOXAPARIN SODIUM 80 MG/0.8ML ~~LOC~~ SOLN
80.0000 mg | SUBCUTANEOUS | Status: DC
  Administered 2015-07-17 – 2015-07-21 (×5): 80 mg via SUBCUTANEOUS
  Filled 2015-07-17 (×5): qty 0.8

## 2015-07-17 MED ORDER — ALBUTEROL SULFATE (2.5 MG/3ML) 0.083% IN NEBU
2.5000 mg | INHALATION_SOLUTION | RESPIRATORY_TRACT | Status: DC
  Administered 2015-07-17 – 2015-07-18 (×6): 2.5 mg via RESPIRATORY_TRACT
  Filled 2015-07-17 (×7): qty 3

## 2015-07-17 MED ORDER — ONDANSETRON HCL 4 MG PO TABS: 4.0000 mg | ORAL_TABLET | Freq: Four times a day (QID) | ORAL | Status: DC | PRN

## 2015-07-17 MED ORDER — INSULIN GLARGINE 100 UNIT/ML ~~LOC~~ SOLN
70.0000 [IU] | Freq: Every day | SUBCUTANEOUS | Status: DC
  Administered 2015-07-17: 70 [IU] via SUBCUTANEOUS
  Filled 2015-07-17 (×6): qty 0.7

## 2015-07-17 MED ORDER — ALBUTEROL SULFATE (2.5 MG/3ML) 0.083% IN NEBU: 2.5000 mg | INHALATION_SOLUTION | RESPIRATORY_TRACT | Status: DC | PRN

## 2015-07-17 MED ORDER — PERFLUTREN LIPID MICROSPHERE
1.0000 mL | INTRAVENOUS | Status: AC | PRN
  Administered 2015-07-17: 4 mL via INTRAVENOUS
  Filled 2015-07-17: qty 10

## 2015-07-17 MED ORDER — ONDANSETRON HCL 4 MG/2ML IJ SOLN: 4.0000 mg | Freq: Four times a day (QID) | INTRAMUSCULAR | Status: DC | PRN

## 2015-07-17 MED ORDER — ASPIRIN EC 325 MG PO TBEC
325.0000 mg | DELAYED_RELEASE_TABLET | Freq: Every day | ORAL | Status: DC
  Administered 2015-07-17 – 2015-07-21 (×5): 325 mg via ORAL
  Filled 2015-07-17 (×5): qty 1

## 2015-07-17 MED ORDER — BUDESONIDE-FORMOTEROL FUMARATE 160-4.5 MCG/ACT IN AERO
2.0000 | INHALATION_SPRAY | Freq: Two times a day (BID) | RESPIRATORY_TRACT | Status: DC
  Administered 2015-07-17 – 2015-07-21 (×8): 2 via RESPIRATORY_TRACT
  Filled 2015-07-17: qty 6

## 2015-07-17 MED ORDER — CYCLOBENZAPRINE HCL 10 MG PO TABS: 10.0000 mg | ORAL_TABLET | Freq: Three times a day (TID) | ORAL | Status: DC | PRN

## 2015-07-17 MED ORDER — INSULIN ASPART 100 UNIT/ML ~~LOC~~ SOLN
20.0000 [IU] | Freq: Three times a day (TID) | SUBCUTANEOUS | Status: DC
  Administered 2015-07-17 – 2015-07-20 (×10): 20 [IU] via SUBCUTANEOUS

## 2015-07-17 MED ORDER — SODIUM CHLORIDE 0.9 % IJ SOLN
3.0000 mL | Freq: Two times a day (BID) | INTRAMUSCULAR | Status: DC
  Administered 2015-07-17 – 2015-07-21 (×8): 3 mL via INTRAVENOUS

## 2015-07-17 MED ORDER — METOPROLOL TARTRATE 50 MG PO TABS
50.0000 mg | ORAL_TABLET | Freq: Two times a day (BID) | ORAL | Status: DC
  Administered 2015-07-17 – 2015-07-21 (×10): 50 mg via ORAL
  Filled 2015-07-17 (×11): qty 1

## 2015-07-17 MED ORDER — FUROSEMIDE 10 MG/ML IJ SOLN
80.0000 mg | Freq: Two times a day (BID) | INTRAMUSCULAR | Status: DC
  Administered 2015-07-17 – 2015-07-21 (×9): 80 mg via INTRAVENOUS
  Filled 2015-07-17 (×12): qty 8

## 2015-07-17 NOTE — Progress Notes (Signed)
Echocardiogram 2D Echocardiogram with Definity has been performed.  Catherine Williams 07/17/2015, 10:06 AM

## 2015-07-17 NOTE — Progress Notes (Signed)
Patient seen and examined. Admitted after midnight secondary to acute on chronic diastolic CHF (EF [id number]). Presumed to be secondary to diet indiscretion and medication no compliance. Patient also with OHS and OSA as conditions contributing to her SOB. Denies CP currently. VSS. Please referred to H&P written by Dr. Hal Hope for further info/details on admission   Plan: -daily weights, strict I's and O's and low sodium diet -continue IV lasix -follow clinical response -QHS CPAP -2-D echo has been performed and demonstrated preserved EF and diastolic dysfunction. Very limited study due to body habitus -follow troponin  Barton Dubois MD [phone]

## 2015-07-17 NOTE — Progress Notes (Signed)
Utilization review completed. Katilynn Sinkler, RN, BSN. 

## 2015-07-17 NOTE — Procedures (Signed)
Pt refuses MDI and this time and wants to sleep.  In addition, pt refuses CPAP for the evening but states that she will try it tomorrow.  RT has placed CPAP on standby in room.

## 2015-07-17 NOTE — H&P (Signed)
[facility] History and Physical  ALYNE Williams [id number] DOB: 26-Nov-1961 DOA: 07/16/2015  Referring physician: Dr. Maryan Rued. PCP: Philis Fendt, MD  Specialists: Dr. Trula Slade. Vascular surgeon.  Chief Complaint: Shortness of breath and chest pain.  HPI: Catherine Williams is a 54 y.o. female with history of diastolic CHF last evaluation in 2010 was 45-50% with grade 1 diastolic dysfunction, peripheral arterial disease, diabetes mellitus type 2, hypertension, OSA, chronic kidney disease presents to the ER because of worsening shortness of breath over the last 1 month. In addition patient also has been having some left-sided chest pain off and on increasing on exertion with pressure-like nonradiating. Patient denies any fever or chills productive cough. Patient is stating that she feels very weak and fatigued. In the ER cardiac markers and chest x-ray were unremarkable and as per the ER physician EKG was not showing anything acute. D-dimer was mildly elevated but since patient has chronic kidney disease CT angiogram of the chest was unable to be done. Patient presently chest pain-free and has been admitted for further management. Patient states she has gained weight and also has noticed sometimes lower extremity edema.   Review of Systems: As presented in the history of presenting illness, rest negative.  Past Medical History  Diagnosis Date  . Hypertension   . Heart failure   . Asthma   . Diabetes mellitus   . Hyperlipidemia   . Kidney disease   . Pinched nerve   . Morbid obesity   . COPD (chronic obstructive pulmonary disease)   . Sleep apnea   . Peripheral vascular disease   . Arthritis Dec. 2014    Gout  . Anemia   . CHF (congestive heart failure)    Past Surgical History  Procedure Laterality Date  . Ovary surgery      Left  . Hand surgery      right  . Atherectomy and angioplasty  10/18/2011    left posterior tibial artery  . Toe amputation  Sept. 25,2012   Left 4th and 5th toes   Social History:  reports that she has quit smoking. Her smoking use included Cigarettes. She has never used smokeless tobacco. She reports that she uses illicit drugs (Marijuana). She reports that she does not drink alcohol. Where does patient live home. Can patient participate in ADLs? Yes.  Allergies  Allergen Reactions  . Omnipaque [Iohexol] Nausea And Vomiting    Contrast Dye- Effects Kidney's    Family History:  Family History  Problem Relation Age of Onset  . Lung cancer Mother   . Clotting disorder Mother   . Heart disease Mother   . Cancer Mother     Lung  . Deep vein thrombosis Mother   . Diabetes Mother   . Hypertension Mother   . Varicose Veins Mother   . Cancer Father   . Clotting disorder Sister   . Heart attack Sister   . Diabetes Sister   . Clotting disorder Brother   . Deep vein thrombosis Brother   . Diabetes Brother   . Heart disease Sister   . Heart disease Brother       Prior to Admission medications   Medication Sig Start Date End Date Taking? Authorizing Provider  albuterol (PROVENTIL HFA;VENTOLIN HFA) 108 (90 BASE) MCG/ACT inhaler Inhale 1-2 puffs into the lungs every 6 (six) hours as needed for wheezing. 07/25/13  Yes Harden Mo, MD  amLODipine (NORVASC) 10 MG tablet Take 10 mg by mouth daily.  Yes Historical Provider, MD  aspirin 81 MG tablet Take 81 mg by mouth daily.     Yes Historical Provider, MD  budesonide-formoterol (SYMBICORT) 160-4.5 MCG/ACT inhaler Inhale 2 puffs into the lungs 2 (two) times daily. 07/25/13  Yes Harden Mo, MD  cyclobenzaprine (FLEXERIL) 10 MG tablet Take 10 mg by mouth 3 (three) times daily as needed for muscle spasms.   Yes Historical Provider, MD  furosemide (LASIX) 80 MG tablet Take 80 mg by mouth 2 (two) times daily.   Yes Historical Provider, MD  gabapentin (NEURONTIN) 100 MG capsule Take 100 mg by mouth at bedtime.   Yes Historical Provider, MD  hydrALAZINE (APRESOLINE) 25 MG tablet  Take 25 mg by mouth daily.    Yes Historical Provider, MD  HYDROcodone-acetaminophen (NORCO) 10-325 MG per tablet Take 1 tablet by mouth every 6 (six) hours as needed for moderate pain.    Yes Historical Provider, MD  insulin aspart (NOVOLOG) 100 UNIT/ML injection Inject 20 Units into the skin 3 (three) times daily with meals.    Yes Historical Provider, MD  insulin glargine (LANTUS) 100 UNIT/ML injection Inject 70 Units into the skin at bedtime.    Yes Historical Provider, MD  metoprolol (LOPRESSOR) 50 MG tablet Take 50 mg by mouth 2 (two) times daily.     Yes Historical Provider, MD  rosuvastatin (CRESTOR) 40 MG tablet Take 40 mg by mouth every evening.    Yes Historical Provider, MD  amoxicillin-clavulanate (AUGMENTIN) 875-125 MG per tablet Take 1 tablet by mouth every 12 (twelve) hours. Patient not taking: Reported on 01/29/2015 06/12/14   Gregor Hams, MD  ipratropium (ATROVENT) 0.06 % nasal spray Place 2 sprays into both nostrils 4 (four) times daily. Patient not taking: Reported on 01/29/2015 06/12/14   Gregor Hams, MD  levofloxacin (LEVAQUIN) 750 MG tablet Take 1 tablet (750 mg total) by mouth daily. X 7 days Patient not taking: Reported on 01/29/2015 11/22/14   Merrily Pew, MD  ondansetron (ZOFRAN ODT) 4 MG disintegrating tablet Take 1 tablet (4 mg total) by mouth every 8 (eight) hours as needed for nausea or vomiting. Patient not taking: Reported on 01/29/2015 12/12/13   Harvie Heck, PA-C  predniSONE (DELTASONE) 20 MG tablet Take 2 tablets (40 mg total) by mouth daily with breakfast. 2 tabs po daily x 3 days Patient not taking: Reported on 01/29/2015 11/22/14   Merrily Pew, MD  promethazine (PHENERGAN) 25 MG suppository Place 1 suppository (25 mg total) rectally every 6 (six) hours as needed for nausea or vomiting. Patient not taking: Reported on 01/29/2015 12/12/13   Harvie Heck, PA-C  traMADol-acetaminophen (ULTRACET) 37.5-325 MG per tablet Take 1 tablet by mouth every 6 (six) hours as  needed for pain. Patient not taking: Reported on 07/16/2015 09/20/12   Monico Blitz, PA-C    Physical Exam: Filed Vitals:   07/16/15 2125 07/16/15 2130 07/16/15 2321 07/17/15 0000  BP: 133/49 150/47 135/56 151/58  Pulse: 72 77 78 78  Temp:   98.3 F (36.8 C) 98.1 F (36.7 C)  TempSrc:    Oral  Resp: 20 22 19 20   Height:    5\' 3"  (1.6 m)  Weight:    159.394 kg (351 lb 6.4 oz)  SpO2: 98% 100% 99% 98%     General:  Obese not in distress.  Eyes: Anicteric no pallor.  ENT: No discharge from the ears eyes nose and mouth.  Neck: JVD mildly elevated no mass felt.  Cardiovascular: S1-S2 heard.  Respiratory: No rhonchi or crepitations.  Abdomen: Soft nontender bowel sounds present.  Skin: No rash.  Musculoskeletal: No edema.  Psychiatric: Appears normal.  Neurologic: Alert awake oriented to time place and person. Moves all extremities.  Labs on Admission:  Basic Metabolic Panel:  Recent Labs Lab 07/16/15 2105 07/16/15 2117  NA 141 143  K 3.9 3.8  CL 102 101  CO2 29  --   GLUCOSE 87 87  BUN 43* 44*  CREATININE 2.55* 2.40*  CALCIUM 9.5  --    Liver Function Tests:  Recent Labs Lab 07/16/15 2105  AST 18  ALT 15  ALKPHOS 68  BILITOT 0.7  PROT 7.4  ALBUMIN 3.3*   No results for input(s): LIPASE, AMYLASE in the last 168 hours. No results for input(s): AMMONIA in the last 168 hours. CBC:  Recent Labs Lab 07/16/15 2105 07/16/15 2117  WBC 9.9  --   NEUTROABS 6.8  --   HGB 10.3* 12.6  HCT 35.0* 37.0  MCV 85.2  --   PLT 243  --    Cardiac Enzymes: No results for input(s): CKTOTAL, CKMB, CKMBINDEX, TROPONINI in the last 168 hours.  BNP (last 3 results)  Recent Labs  01/14/15 1052 07/16/15 2105  BNP 70.0 66.2    ProBNP (last 3 results)  Recent Labs  11/22/14 1854  PROBNP 279.8*    CBG:  Recent Labs Lab 07/17/15 0013  GLUCAP 148*    Radiological Exams on Admission: Dg Chest 2 View  07/16/2015   CLINICAL DATA:  Chronic  shortness of breath, fluid retention, hypertension and diabetes. History COPD and CHF  EXAM: CHEST  2 VIEW  COMPARISON:  01/14/2015  FINDINGS: Limited exam because of obese body habitus. Stable mild cardiac enlargement with vascular and interstitial prominence. No definite superimposed edema, focal pneumonia, collapse or consolidation. No large effusion or pneumothorax. Trachea midline.  IMPRESSION: Stable chest exam. No superimposed acute process by plain radiography   Electronically Signed   By: Jerilynn Mages.  Shick M.D.   On: 07/16/2015 22:01     Assessment/Plan Principal Problem:   Acute respiratory failure with hypoxia Active Problems:   OSA (obstructive sleep apnea)   CKD (chronic kidney disease) stage 3, GFR 30-59 ml/min   Diabetes mellitus type 2, controlled   Chronic anemia   Hypertension   1. Acute respiratory failure with hypoxia - cause not clear suspect diastolic CHF. At this time I have placed patient on Lasix 80 mg IV every 12 and since patient's d-dimer is elevated VQ scan has been ordered. Check 2-D echo and closely monitor intake output and daily weights and metabolic panel. 2. Chest pain - cycle cardiac markers. Check 2-D echo. Check VQ scan. Aspirin. 3. OSA - on CPAP. 4. COPD - presently not wheezing continue inhalers. 5. Chronic kidney disease stage III - creatinine appears to be at baseline. Closely follow metabolic panel. 6. Hypertension - continue present medications. 7. Chronic anemia - follow CBC. 8. Diabetes mellitus type 2 - continue home medication and closely follow CBGs with sliding scale coverage.  I have reviewed patient's old charts were labs and personally reviewed patient's chest x-ray.  I have ordered a repeat EKG has not able to trace the first one.   DVT Prophylaxis Lovenox.  Code Status: Full code.  Family Communication: Discussed with patient.  Disposition Plan: Admit to inpatient.    Maxey Ransom N. [facility] Pager [phone].  If  7PM-7AM, please contact night-coverage [email or web address] Password [email or web address] 07/17/2015, 1:03 AM

## 2015-07-17 NOTE — Clinical Documentation Improvement (Signed)
Supporting Information: Acute respiratory failure with hypoxia - cause not clear suspect diastolic CHF. At this time I have placed patient on Lasix 80 mg IV every 12 hours per 7/29 progress notes.       (please specify diastolic CHF as acute vs chronic in the progress notes and discharge summary).    . Document acuity --Acute --Chronic --Acute on Chronic . Document type --Diastolic --Systolic --Combined systolic and diastolic . Due to or associated with --Cardiac or other surgery --Hypertension --Valvular disease --Rheumatic heart disease Endocarditis (valvitis) Pericarditis Myocarditis --Other (specify)    Thank Sherian Maroon Documentation Specialist [phone] [email or web address]

## 2015-07-18 DIAGNOSIS — I10 Essential (primary) hypertension: Secondary | ICD-10-CM

## 2015-07-18 DIAGNOSIS — G4733 Obstructive sleep apnea (adult) (pediatric): Secondary | ICD-10-CM

## 2015-07-18 DIAGNOSIS — E1121 Type 2 diabetes mellitus with diabetic nephropathy: Secondary | ICD-10-CM

## 2015-07-18 DIAGNOSIS — N183 Chronic kidney disease, stage 3 (moderate): Secondary | ICD-10-CM

## 2015-07-18 LAB — GLUCOSE, CAPILLARY
GLUCOSE-CAPILLARY: 217 mg/dL — AB (ref 65–99)
GLUCOSE-CAPILLARY: 182 mg/dL — AB (ref 65–99)
GLUCOSE-CAPILLARY: 90 mg/dL (ref 65–99)
Glucose-Capillary: 106 mg/dL — ABNORMAL HIGH (ref 65–99)

## 2015-07-18 LAB — HEMOGLOBIN A1C
HEMOGLOBIN A1C: 9.4 % — AB (ref 4.8–5.6)
MEAN PLASMA GLUCOSE: 223 mg/dL

## 2015-07-18 MED ORDER — HYDRALAZINE HCL 10 MG PO TABS
20.0000 mg | ORAL_TABLET | Freq: Two times a day (BID) | ORAL | Status: DC
  Administered 2015-07-18 – 2015-07-21 (×6): 20 mg via ORAL
  Filled 2015-07-18 (×7): qty 2

## 2015-07-18 NOTE — Progress Notes (Signed)
Patient alert and oriented x4 throughout shift, vital signs stable.  Family at bedside this evening.  Patient encouraged to walk/transfer with assistance throughout shift.  Patient and family deny any questions or concerns at this time.  Report given to night shift RN, who will continue to monitor.

## 2015-07-18 NOTE — Progress Notes (Signed)
[facility] PROGRESS NOTE  Catherine Williams [id number] DOB: 1960/12/27 DOA: 07/16/2015 PCP: Philis Fendt, MD  Assessment/Plan: 1-acute resp failure with hypoxia: multifactorial: due to CHF exacerbation, OHS, OSA -will continue IV lasix, low sodium diet, strict intake and Output and daily weights. Continue metoprolol -will follow BMET closely -encourage to be compliant with CPAP  -advise to lose weight aggressively   2-acute on chronic diastolic heart failure: preserved EF -will continue treatment as mentioned above for acute exacerbation -nitrates added for better BP control -patient educated about condition and importance of low sodium diet, daily weights and medication compliance  3-HTN: continue current antihypertensive regimen  4-chronic renal failure: stage 3 at baseline -will continue close monitoring of renal function -stable currently  5-chest pain: demand ischemia from CHF exacerbation most likely -troponin has been neg X 3 -no wall motion abnormalities able to be appreciated on 2-d echo  6-OSA: will continue CPAP  7-COPD: continue home medication regimen. No wheezing currently  8-diabetes type 2: will continue SSI and lantus.  9-HLD: continue statins   Code Status: Full Family Communication: no family at bedside Disposition Plan: home when CHF exacerbation resolved. Still SOB and with fluid overload    Consultants:  None   Procedures:  2-D echo: 07/17/15 - Left ventricle: Systolic function was normal. The estimated ejection fraction was in the range of 60% to 65%. Doppler parameters are consistent with abnormal left ventricular relaxation (grade 1 diastolic dysfunction). Doppler parameters are consistent with high ventricular filling pressure.  Impressions: - Extremely limited; definity used; LV function appears to be normal; focal wall motion abnormality cannot be excluded; grade 1 diastolic dysfunction with elevated LV filling  pressure; doppler suboptimal; no other useful information can be obtained with this study.  Antibiotics:  None   HPI/Subjective: Feeling better. Still SOB and with signs of fluid overload. Good diuresis overnight   Objective: Filed Vitals:   07/18/15 1110  BP: 142/67  Pulse: 80  Temp:   Resp:     Intake/Output Summary (Last 24 hours) at 07/18/15 1303 Last data filed at 07/18/15 1258  Gross per 24 hour  Intake   1323 ml  Output   2750 ml  Net  -1427 ml   Filed Weights   07/17/15 0000 07/17/15 0635 07/18/15 0700  Weight: 159.394 kg (351 lb 6.4 oz) 159.394 kg (351 lb 6.4 oz) 153.6 kg (338 lb 10 oz)    Exam:   General:  Afebrile, still SOB and fluid overload; but breathing better. Denies CP  Cardiovascular: S1 and S2, no rubs or gallops; distant sounds  Respiratory: decrease BS at bases, positive crackles, no wheezing   Abdomen: soft, NT, ND, positive BS  Musculoskeletal: 2++ edema bilaterally   Data Reviewed: Basic Metabolic Panel:  Recent Labs Lab 07/16/15 2105 07/16/15 2117 07/17/15 0209 07/17/15 0833  NA 141 143  --  143  K 3.9 3.8  --  4.6  CL 102 101  --  102  CO2 29  --   --  32  GLUCOSE 87 87  --  159*  BUN 43* 44*  --  44*  CREATININE 2.55* 2.40* 2.61* 2.51*  CALCIUM 9.5  --   --  9.6   Liver Function Tests:  Recent Labs Lab 07/16/15 2105 07/17/15 0833  AST 18 15  ALT 15 13*  ALKPHOS 68 66  BILITOT 0.7 0.5  PROT 7.4 6.8  ALBUMIN 3.3* 3.0*   CBC:  Recent Labs Lab 07/16/15 2105 07/16/15 2117 07/17/15 0209  07/17/15 0833  WBC 9.9  --  10.6* 10.5  NEUTROABS 6.8  --   --  7.9*  HGB 10.3* 12.6 9.9* 10.1*  HCT 35.0* 37.0 33.6* 35.0*  MCV 85.2  --  85.9 86.6  PLT 243  --  224 238   Cardiac Enzymes:  Recent Labs Lab 07/17/15 0209 07/17/15 0833 07/17/15 1442  TROPONINI <0.03 <0.03 <0.03   BNP (last 3 results)  Recent Labs  01/14/15 1052 07/16/15 2105  BNP 70.0 66.2    ProBNP (last 3 results)  Recent Labs   11/22/14 1854  PROBNP 279.8*    CBG:  Recent Labs Lab 07/17/15 1650 07/17/15 1749 07/17/15 2122 07/18/15 0610 07/18/15 1116  GLUCAP 96 160* 160* 217* 182*    Studies: Dg Chest 2 View  07/16/2015   CLINICAL DATA:  Chronic shortness of breath, fluid retention, hypertension and diabetes. History COPD and CHF  EXAM: CHEST  2 VIEW  COMPARISON:  01/14/2015  FINDINGS: Limited exam because of obese body habitus. Stable mild cardiac enlargement with vascular and interstitial prominence. No definite superimposed edema, focal pneumonia, collapse or consolidation. No large effusion or pneumothorax. Trachea midline.  IMPRESSION: Stable chest exam. No superimposed acute process by plain radiography   Electronically Signed   By: Jerilynn Mages.  Shick M.D.   On: 07/16/2015 22:01   Nm Pulmonary Perf And Vent  07/17/2015   CLINICAL DATA:  Shortness of breath and chest pain  EXAM: NUCLEAR MEDICINE VENTILATION - PERFUSION LUNG SCAN  Views: Anterior, posterior, RAO, LAO, RPO, LPO -ventilation and perfusion  Radionuclide: Technetium 64m DTPA -ventilation; Technetium 32m macroaggregated albumin- perfusion  Dose:  40.5 mCi-ventilation; 6.3 mCi-perfusion  Route of administration: Inhalation -ventilation ; intravenous -perfusion  COMPARISON:  Chest radiograph July 16, 2015  FINDINGS: Ventilation: Radiotracer uptake is homogeneous and symmetric bilaterally. No ventilation defects are identified.  Perfusion: Radiotracer uptake is homogeneous and symmetric bilaterally. No perfusion defects are identified.  IMPRESSION: No appreciable ventilation or perfusion defects. This study constitutes a very low probability of pulmonary embolus.   Electronically Signed   By: Lowella Grip III M.D.   On: 07/17/2015 16:29    Scheduled Meds: . amLODipine  10 mg Oral Daily  . aspirin EC  325 mg Oral Daily  . budesonide-formoterol  2 puff Inhalation BID  . enoxaparin (LOVENOX) injection  80 mg Subcutaneous Q24H  . furosemide  80 mg  Intravenous Q12H  . gabapentin  100 mg Oral QHS  . hydrALAZINE  25 mg Oral Daily  . insulin aspart  0-9 Units Subcutaneous TID WC  . insulin aspart  20 Units Subcutaneous TID WC  . insulin glargine  70 Units Subcutaneous QHS  . metoprolol  50 mg Oral BID  . rosuvastatin  40 mg Oral QPM  . sodium chloride  3 mL Intravenous Q12H   Continuous Infusions:   Principal Problem:   Acute respiratory failure with hypoxia Active Problems:   OSA (obstructive sleep apnea)   CKD (chronic kidney disease) stage 3, GFR 30-59 ml/min   Diabetes mellitus type 2, controlled   Chronic anemia   Hypertension   Acute on chronic diastolic heart failure    Time spent: 35 minutes    Barton Dubois  [facility] Pager [phone]. If 7PM-7AM, please contact night-coverage at [email or web address], password [email or web address] 07/18/2015, 1:03 PM  LOS: 2 days

## 2015-07-19 ENCOUNTER — Inpatient Hospital Stay (HOSPITAL_COMMUNITY): Payer: Medicare Other

## 2015-07-19 DIAGNOSIS — D649 Anemia, unspecified: Secondary | ICD-10-CM

## 2015-07-19 DIAGNOSIS — Z6841 Body Mass Index (BMI) 40.0 and over, adult: Secondary | ICD-10-CM

## 2015-07-19 DIAGNOSIS — E662 Morbid (severe) obesity with alveolar hypoventilation: Secondary | ICD-10-CM

## 2015-07-19 LAB — CBC WITH DIFFERENTIAL/PLATELET
Basophils Absolute: 0 10*3/uL (ref 0.0–0.1)
Basophils Relative: 0 % (ref 0–1)
Eosinophils Absolute: 0.1 10*3/uL (ref 0.0–0.7)
Eosinophils Relative: 1 % (ref 0–5)
HCT: 31.7 % — ABNORMAL LOW (ref 36.0–46.0)
Hemoglobin: 9.2 g/dL — ABNORMAL LOW (ref 12.0–15.0)
Lymphocytes Relative: 18 % (ref 12–46)
Lymphs Abs: 1.7 10*3/uL (ref 0.7–4.0)
MCH: 24.7 pg — ABNORMAL LOW (ref 26.0–34.0)
MCHC: 29 g/dL — ABNORMAL LOW (ref 30.0–36.0)
MCV: 85.2 fL (ref 78.0–100.0)
Monocytes Absolute: 0.5 10*3/uL (ref 0.1–1.0)
Monocytes Relative: 6 % (ref 3–12)
Neutro Abs: 7.3 10*3/uL (ref 1.7–7.7)
Neutrophils Relative %: 75 % (ref 43–77)
Platelets: 254 10*3/uL (ref 150–400)
RBC: 3.72 MIL/uL — ABNORMAL LOW (ref 3.87–5.11)
RDW: 16.1 % — ABNORMAL HIGH (ref 11.5–15.5)
WBC: 9.6 10*3/uL (ref 4.0–10.5)

## 2015-07-19 LAB — URINALYSIS, ROUTINE W REFLEX MICROSCOPIC
Bilirubin Urine: NEGATIVE
Glucose, UA: NEGATIVE mg/dL
Hgb urine dipstick: NEGATIVE
Ketones, ur: NEGATIVE mg/dL
Leukocytes, UA: NEGATIVE
Nitrite: NEGATIVE
Protein, ur: NEGATIVE mg/dL
Specific Gravity, Urine: 1.01 (ref 1.005–1.030)
Urobilinogen, UA: 0.2 mg/dL (ref 0.0–1.0)
pH: 5 (ref 5.0–8.0)

## 2015-07-19 LAB — COMPREHENSIVE METABOLIC PANEL
ALT: 12 U/L — ABNORMAL LOW (ref 14–54)
AST: 13 U/L — ABNORMAL LOW (ref 15–41)
Albumin: 2.8 g/dL — ABNORMAL LOW (ref 3.5–5.0)
Alkaline Phosphatase: 64 U/L (ref 38–126)
Anion gap: 10 (ref 5–15)
BUN: 56 mg/dL — ABNORMAL HIGH (ref 6–20)
CO2: 32 mmol/L (ref 22–32)
Calcium: 9.4 mg/dL (ref 8.9–10.3)
Chloride: 98 mmol/L — ABNORMAL LOW (ref 101–111)
Creatinine, Ser: 2.68 mg/dL — ABNORMAL HIGH (ref 0.44–1.00)
GFR calc Af Amer: 22 mL/min — ABNORMAL LOW (ref >60)
GFR calc non Af Amer: 19 mL/min — ABNORMAL LOW (ref >60)
Glucose, Bld: 113 mg/dL — ABNORMAL HIGH (ref 65–99)
Potassium: 4.3 mmol/L (ref 3.5–5.1)
Sodium: 140 mmol/L (ref 135–145)
Total Bilirubin: 0.6 mg/dL (ref 0.3–1.2)
Total Protein: 6.9 g/dL (ref 6.5–8.1)

## 2015-07-19 LAB — GLUCOSE, CAPILLARY
GLUCOSE-CAPILLARY: 111 mg/dL — AB (ref 65–99)
GLUCOSE-CAPILLARY: 186 mg/dL — AB (ref 65–99)
Glucose-Capillary: 104 mg/dL — ABNORMAL HIGH (ref 65–99)
Glucose-Capillary: 189 mg/dL — ABNORMAL HIGH (ref 65–99)
Glucose-Capillary: 152 mg/dL — ABNORMAL HIGH (ref 65–99)

## 2015-07-19 LAB — LACTIC ACID, PLASMA: Lactic Acid, Venous: 0.7 mmol/L (ref 0.5–2.0)

## 2015-07-19 NOTE — Progress Notes (Signed)
Patient verbalized hearing voices and seeing the wallpaper on the wall outside his room change throughout the night. She also admitted to having a headache. Md. Dyann Kief was notified. He reinforced the necessity for patient to wear her c-pap at night. Patient said the she did, it was removed at 5:30am. No additional orders were given. Patient sats was greater 90% and she remained on 3 liters of oxygen. Continued to monitor for patient safety. Patient didn't complain of hearing voices or seeing thing anymore throughout the shift.

## 2015-07-19 NOTE — Plan of Care (Signed)
Problem: Phase I Progression Outcomes Goal: EF % per last Echo/documented,Core Reminder form on chart Outcome: Completed/Met Date Met:  07/19/15 EF 60-65%(07-17-15)

## 2015-07-19 NOTE — Progress Notes (Signed)
[facility] PROGRESS NOTE  Catherine Williams [id number] DOB: 1961/05/18 DOA: 07/16/2015 PCP: Philis Fendt, MD  Assessment/Plan: 1-acute resp failure with hypoxia: multifactorial: due to CHF exacerbation, OHS, OSA -will continue IV lasix, low sodium diet, strict intake and Output and daily weights. Continue metoprolol -will follow BMET closely -encourage to be compliant with CPAP at bedtime -advise to lose weight aggressively  -We'll also days need for oxygen supplementation at discharge.  2-acute on chronic diastolic heart failure: preserved EF -will continue treatment as mentioned above for acute exacerbation -nitrates added for better BP control; BP is doing better. -patient educated about condition and importance of low sodium diet, daily weights and medication compliance  3-HTN: continue current antihypertensive regimen  4-chronic renal failure: stage 3 at baseline -will continue close monitoring of renal function, especially with IV diuresis -stable currently  5-chest pain:  -Most likely demand ischemia from CHF exacerbation versus GERD (patient at high risk given obesity) -troponin has been neg X 3 -no wall motion abnormalities able to be appreciated on 2-D echo -Denies further chest pain  6-OSA:  -will encourage continue use of CPAP at bedtime (patient needs at least 6-8 hours on the machine).  7-COPD: continue home medication regimen.  -No wheezing currently  8-diabetes type 2: will continue SSI and lantus.  9-HLD: continue statins  10-Low grade temp:  -Wbc's within normal limits -No abdominal pain, no dysuria and no clear signs of infection appreciated -We will hold antibiotics for now and monitor her vital signs  11-morbidly obese: Body mass index is 60.12 kg/(m^2). -Low calorie diet, increase exercise and further education/assessment at bariatric center discussed with patient.   Code Status: Full Family Communication: no family at  bedside Disposition Plan: home when CHF exacerbation resolved. Still SOB and with fluid overload    Consultants:  None   Procedures:  2-D echo: 07/17/15 - Left ventricle: Systolic function was normal. The estimated ejection fraction was in the range of 60% to 65%. Doppler parameters are consistent with abnormal left ventricular relaxation (grade 1 diastolic dysfunction). Doppler parameters are consistent with high ventricular filling pressure.  Impressions: - Extremely limited; definity used; LV function appears to be normal; focal wall motion abnormality cannot be excluded; grade 1 diastolic dysfunction with elevated LV filling pressure; doppler suboptimal; no other useful information can be obtained with this study.  Antibiotics:  None   HPI/Subjective: Feeling somewhat better. Low-grade temperature overnight documented. No chest pain, no abdominal pain, no nausea/vomiting and no dysuria.  Objective: Filed Vitals:   07/19/15 1337  BP: 122/48  Pulse: 73  Temp: 98.2 F (36.8 C)  Resp: 18    Intake/Output Summary (Last 24 hours) at 07/19/15 1433 Last data filed at 07/19/15 1400  Gross per 24 hour  Intake    900 ml  Output   1525 ml  Net   -625 ml   Filed Weights   07/17/15 0635 07/18/15 0700 07/19/15 0201  Weight: 159.394 kg (351 lb 6.4 oz) 153.6 kg (338 lb 10 oz) 153.9 kg (339 lb 4.6 oz)    Exam:   General:  Patient with low grade temperature overnight, still SOB and with signs of fluid overload; but breathing better overall. Denies CP, dysuria, abdominal pain, nausea, vomiting or any other complaints  Cardiovascular: S1 and S2, no rubs or gallops; distant sounds  Respiratory: decrease BS at bases, positive crackles, no wheezing   Abdomen: soft, NT, ND, positive BS  Musculoskeletal: 2++ edema bilaterally   Data Reviewed: Basic Metabolic  Panel:  Recent Labs Lab 07/16/15 2105 07/16/15 2117 07/17/15 0209 07/17/15 0833  07/19/15 0535  NA 141 143  --  143 140  K 3.9 3.8  --  4.6 4.3  CL 102 101  --  102 98*  CO2 29  --   --  32 32  GLUCOSE 87 87  --  159* 113*  BUN 43* 44*  --  44* 56*  CREATININE 2.55* 2.40* 2.61* 2.51* 2.68*  CALCIUM 9.5  --   --  9.6 9.4   Liver Function Tests:  Recent Labs Lab 07/16/15 2105 07/17/15 0833 07/19/15 0535  AST 18 15 13*  ALT 15 13* 12*  ALKPHOS 68 66 64  BILITOT 0.7 0.5 0.6  PROT 7.4 6.8 6.9  ALBUMIN 3.3* 3.0* 2.8*   CBC:  Recent Labs Lab 07/16/15 2105 07/16/15 2117 07/17/15 0209 07/17/15 0833 07/19/15 0535  WBC 9.9  --  10.6* 10.5 9.6  NEUTROABS 6.8  --   --  7.9* 7.3  HGB 10.3* 12.6 9.9* 10.1* 9.2*  HCT 35.0* 37.0 33.6* 35.0* 31.7*  MCV 85.2  --  85.9 86.6 85.2  PLT 243  --  224 238 254   Cardiac Enzymes:  Recent Labs Lab 07/17/15 0209 07/17/15 0833 07/17/15 1442  TROPONINI <0.03 <0.03 <0.03   BNP (last 3 results)  Recent Labs  01/14/15 1052 07/16/15 2105  BNP 70.0 66.2    ProBNP (last 3 results)  Recent Labs  11/22/14 1854  PROBNP 279.8*    CBG:  Recent Labs Lab 07/18/15 1651 07/18/15 2110 07/19/15 0633 07/19/15 0740 07/19/15 1130  GLUCAP 106* 90 111* 104* 189*    Studies: Dg Chest 2 View  07/19/2015   CLINICAL DATA:  Fever and shortness of breath today.  EXAM: CHEST  2 VIEW  COMPARISON:  07/16/2015  FINDINGS: Mediastinal contours are unchanged, stable mild cardiac enlargement. No confluent airspace disease. No pleural effusion or pneumothorax. Detailed evaluation limited by soft tissue attenuation from body habitus.  IMPRESSION: No acute pulmonary process.   Electronically Signed   By: Jeb Levering M.D.   On: 07/19/2015 05:38   Nm Pulmonary Perf And Vent  07/17/2015   CLINICAL DATA:  Shortness of breath and chest pain  EXAM: NUCLEAR MEDICINE VENTILATION - PERFUSION LUNG SCAN  Views: Anterior, posterior, RAO, LAO, RPO, LPO -ventilation and perfusion  Radionuclide: Technetium 96m DTPA -ventilation; Technetium  58m macroaggregated albumin- perfusion  Dose:  40.5 mCi-ventilation; 6.3 mCi-perfusion  Route of administration: Inhalation -ventilation ; intravenous -perfusion  COMPARISON:  Chest radiograph July 16, 2015  FINDINGS: Ventilation: Radiotracer uptake is homogeneous and symmetric bilaterally. No ventilation defects are identified.  Perfusion: Radiotracer uptake is homogeneous and symmetric bilaterally. No perfusion defects are identified.  IMPRESSION: No appreciable ventilation or perfusion defects. This study constitutes a very low probability of pulmonary embolus.   Electronically Signed   By: Lowella Grip III M.D.   On: 07/17/2015 16:29    Scheduled Meds: . amLODipine  10 mg Oral Daily  . aspirin EC  325 mg Oral Daily  . budesonide-formoterol  2 puff Inhalation BID  . enoxaparin (LOVENOX) injection  80 mg Subcutaneous Q24H  . furosemide  80 mg Intravenous Q12H  . gabapentin  100 mg Oral QHS  . hydrALAZINE  20 mg Oral BID  . insulin aspart  0-9 Units Subcutaneous TID WC  . insulin aspart  20 Units Subcutaneous TID WC  . insulin glargine  70 Units Subcutaneous QHS  . metoprolol  50 mg Oral BID  . rosuvastatin  40 mg Oral QPM  . sodium chloride  3 mL Intravenous Q12H   Continuous Infusions:   Principal Problem:   Acute respiratory failure with hypoxia Active Problems:   OSA (obstructive sleep apnea)   CKD (chronic kidney disease) stage 3, GFR 30-59 ml/min   Diabetes mellitus type 2, controlled   Chronic anemia   Hypertension   Acute on chronic diastolic heart failure    Time spent: 35 minutes    Barton Dubois  [facility] Pager [phone]. If 7PM-7AM, please contact night-coverage at [email or web address], password [facility] 07/19/2015, 2:33 PM  LOS: 3 days

## 2015-07-20 LAB — GLUCOSE, CAPILLARY
GLUCOSE-CAPILLARY: 101 mg/dL — AB (ref 65–99)
GLUCOSE-CAPILLARY: 97 mg/dL (ref 65–99)
Glucose-Capillary: 164 mg/dL — ABNORMAL HIGH (ref 65–99)
Glucose-Capillary: 182 mg/dL — ABNORMAL HIGH (ref 65–99)

## 2015-07-20 MED ORDER — INSULIN ASPART 100 UNIT/ML ~~LOC~~ SOLN
20.0000 [IU] | Freq: Three times a day (TID) | SUBCUTANEOUS | Status: DC
  Administered 2015-07-21 (×2): 20 [IU] via SUBCUTANEOUS

## 2015-07-20 MED ORDER — METHOCARBAMOL 500 MG PO TABS
500.0000 mg | ORAL_TABLET | Freq: Three times a day (TID) | ORAL | Status: DC | PRN
  Filled 2015-07-20: qty 1

## 2015-07-20 NOTE — Progress Notes (Signed)
[facility] PROGRESS NOTE  Catherine Williams [id number] DOB: Apr 12, 1961 DOA: 07/16/2015 PCP: Philis Fendt, MD  Assessment/Plan: 1-acute resp failure with hypoxia: multifactorial: due to CHF exacerbation, OHS, OSA -will continue IV lasix for another 24 hours and then adjust discharge dose -continue low sodium diet, strict intake and Output and daily weights. . -Continue metoprolol -will follow BMET closely -encourage to be compliant with CPAP at bedtime -advise to lose weight aggressively  -Will also assess need for oxygen supplementation at discharge.  2-acute on chronic diastolic heart failure: preserved EF -will continue treatment as mentioned above for acute exacerbation -nitrates added for better BP control; BP is doing better. -patient educated about condition and importance of low sodium diet, daily weights and medication compliance  3-HTN: continue current antihypertensive regimen  4-chronic renal failure: stage 3 at baseline -will continue close monitoring of renal function, especially with IV diuresis -stable currently  5-chest pain:  -Most likely demand ischemia from CHF exacerbation versus GERD (patient at high risk given obesity) -troponin has been neg X 3 -no wall motion abnormalities able to be appreciated on 2-D echo -Denies further chest pain  6-OSA:  -will encourage continue use of CPAP at bedtime (patient needs at least 6-8 hours on the machine).  7-COPD: continue home medication regimen.  -No wheezing currently  8-diabetes type 2: will continue SSI and lantus.  9-HLD: continue statins  10-Low grade temp:  -Wbc's within normal limits -No abdominal pain, no dysuria and no clear signs of infection appreciated -We will hold antibiotics for now and monitor her vital signs  11-morbidly obese: Body mass index is 62.06 kg/(m^2). -Low calorie diet, increase exercise and further education/assessment at bariatric center discussed with  patient.   Code Status: Full Family Communication: no family at bedside Disposition Plan: home when CHF exacerbation resolved. Still SOB and with fluid overload    Consultants:  None   Procedures:  2-D echo: 07/17/15 - Left ventricle: Systolic function was normal. The estimated ejection fraction was in the range of 60% to 65%. Doppler parameters are consistent with abnormal left ventricular relaxation (grade 1 diastolic dysfunction). Doppler parameters are consistent with high ventricular filling pressure.  Impressions: - Extremely limited; definity used; LV function appears to be normal; focal wall motion abnormality cannot be excluded; grade 1 diastolic dysfunction with elevated LV filling pressure; doppler suboptimal; no other useful information can be obtained with this study.  Antibiotics:  None   HPI/Subjective: Feeling better. No CP. Breathing improved. And less swollen.   Objective: Filed Vitals:   07/20/15 0628  BP: 132/74  Pulse: 82  Temp: 97.3 F (36.3 C)  Resp: 18    Intake/Output Summary (Last 24 hours) at 07/20/15 1906 Last data filed at 07/20/15 1530  Gross per 24 hour  Intake    600 ml  Output   2375 ml  Net  -1775 ml   Filed Weights   07/18/15 0700 07/19/15 0201 07/20/15 0628  Weight: 153.6 kg (338 lb 10 oz) 153.9 kg (339 lb 4.6 oz) 158.883 kg (350 lb 4.4 oz)    Exam:   General:  Patient without further fever. Improved SOB. Still with signs of fluid overload; but better Denies CP, dysuria, abdominal pain, nausea, vomiting or any other complaints  Cardiovascular: S1 and S2, no rubs or gallops; distant sounds  Respiratory: decrease BS at bases, mild crackles, no wheezing   Abdomen: soft, NT, ND, positive BS  Musculoskeletal: 2++ edema bilaterally   Data Reviewed: Basic Metabolic Panel:  Recent Labs Lab 07/16/15 2105 07/16/15 2117 07/17/15 0209 07/17/15 0833 07/19/15 0535  NA 141 143  --  143 140  K 3.9 3.8   --  4.6 4.3  CL 102 101  --  102 98*  CO2 29  --   --  32 32  GLUCOSE 87 87  --  159* 113*  BUN 43* 44*  --  44* 56*  CREATININE 2.55* 2.40* 2.61* 2.51* 2.68*  CALCIUM 9.5  --   --  9.6 9.4   Liver Function Tests:  Recent Labs Lab 07/16/15 2105 07/17/15 0833 07/19/15 0535  AST 18 15 13*  ALT 15 13* 12*  ALKPHOS 68 66 64  BILITOT 0.7 0.5 0.6  PROT 7.4 6.8 6.9  ALBUMIN 3.3* 3.0* 2.8*   CBC:  Recent Labs Lab 07/16/15 2105 07/16/15 2117 07/17/15 0209 07/17/15 0833 07/19/15 0535  WBC 9.9  --  10.6* 10.5 9.6  NEUTROABS 6.8  --   --  7.9* 7.3  HGB 10.3* 12.6 9.9* 10.1* 9.2*  HCT 35.0* 37.0 33.6* 35.0* 31.7*  MCV 85.2  --  85.9 86.6 85.2  PLT 243  --  224 238 254   Cardiac Enzymes:  Recent Labs Lab 07/17/15 0209 07/17/15 0833 07/17/15 1442  TROPONINI <0.03 <0.03 <0.03   BNP (last 3 results)  Recent Labs  01/14/15 1052 07/16/15 2105  BNP 70.0 66.2    ProBNP (last 3 results)  Recent Labs  11/22/14 1854  PROBNP 279.8*    CBG:  Recent Labs Lab 07/19/15 1619 07/19/15 2107 07/20/15 0706 07/20/15 1115 07/20/15 1649  GLUCAP 186* 152* 164* 182* 101*    Studies: Dg Chest 2 View  07/19/2015   CLINICAL DATA:  Fever and shortness of breath today.  EXAM: CHEST  2 VIEW  COMPARISON:  07/16/2015  FINDINGS: Mediastinal contours are unchanged, stable mild cardiac enlargement. No confluent airspace disease. No pleural effusion or pneumothorax. Detailed evaluation limited by soft tissue attenuation from body habitus.  IMPRESSION: No acute pulmonary process.   Electronically Signed   By: Jeb Levering M.D.   On: 07/19/2015 05:38    Scheduled Meds: . amLODipine  10 mg Oral Daily  . aspirin EC  325 mg Oral Daily  . budesonide-formoterol  2 puff Inhalation BID  . enoxaparin (LOVENOX) injection  80 mg Subcutaneous Q24H  . furosemide  80 mg Intravenous Q12H  . gabapentin  100 mg Oral QHS  . hydrALAZINE  20 mg Oral BID  . insulin aspart  0-9 Units Subcutaneous  TID WC  . insulin aspart  20 Units Subcutaneous TID WC  . insulin glargine  70 Units Subcutaneous QHS  . metoprolol  50 mg Oral BID  . rosuvastatin  40 mg Oral QPM  . sodium chloride  3 mL Intravenous Q12H   Continuous Infusions:   Principal Problem:   Acute respiratory failure with hypoxia Active Problems:   OSA (obstructive sleep apnea)   CKD (chronic kidney disease) stage 3, GFR 30-59 ml/min   Diabetes mellitus type 2, controlled   Chronic anemia   Hypertension   Acute on chronic diastolic heart failure    Time spent: 35 minutes    Barton Dubois  [facility] Pager [phone]. If 7PM-7AM, please contact night-coverage at [email or web address], password [facility] 07/20/2015, 7:06 PM  LOS: 4 days

## 2015-07-20 NOTE — Care Management Important Message (Signed)
Important Message  Patient Details  Name: Catherine Williams MRN: [id number] Date of Birth: 01-Jul-1961   Medicare Important Message Given:  Yes-second notification given    [name] 07/20/2015, 12:30 PM

## 2015-07-21 DIAGNOSIS — E1121 Type 2 diabetes mellitus with diabetic nephropathy: Secondary | ICD-10-CM | POA: Insufficient documentation

## 2015-07-21 LAB — BASIC METABOLIC PANEL
ANION GAP: 9 (ref 5–15)
BUN: 64 mg/dL — AB (ref 6–20)
CHLORIDE: 98 mmol/L — AB (ref 101–111)
CO2: 30 mmol/L (ref 22–32)
CREATININE: 2.8 mg/dL — AB (ref 0.44–1.00)
Calcium: 9.6 mg/dL (ref 8.9–10.3)
GFR, EST AFRICAN AMERICAN: 21 mL/min — AB (ref >60)
GFR, EST NON AFRICAN AMERICAN: 18 mL/min — AB (ref >60)
GLUCOSE: 130 mg/dL — AB (ref 65–99)
Potassium: 4.6 mmol/L (ref 3.5–5.1)
SODIUM: 137 mmol/L (ref 135–145)

## 2015-07-21 LAB — GLUCOSE, CAPILLARY
GLUCOSE-CAPILLARY: 113 mg/dL — AB (ref 65–99)
Glucose-Capillary: 137 mg/dL — ABNORMAL HIGH (ref 65–99)

## 2015-07-21 LAB — URINE CULTURE: SPECIAL REQUESTS: NORMAL

## 2015-07-21 MED ORDER — TORSEMIDE 20 MG PO TABS
40.0000 mg | ORAL_TABLET | Freq: Two times a day (BID) | ORAL | Status: DC
Start: 2015-07-21 — End: 2016-11-03

## 2015-07-21 MED ORDER — PANTOPRAZOLE SODIUM 40 MG PO TBEC: 40.0000 mg | DELAYED_RELEASE_TABLET | Freq: Every day | ORAL | Status: DC

## 2015-07-21 MED ORDER — HYDRALAZINE HCL 25 MG PO TABS: 25.0000 mg | ORAL_TABLET | Freq: Two times a day (BID) | ORAL | Status: DC

## 2015-07-21 NOTE — Care Management Note (Signed)
Case Management Note  Patient Details  Name: LEXI MURATORI MRN: [id number] Date of Birth: May 06, 1961  Subjective/Objective:       Respiratory failure, OSA             Action/Plan: Home  Expected Discharge Date:  07/21/2015               Expected Discharge Plan:  Home/Self Care  In-House Referral:     Discharge planning Services  CM Consult  Status of Service:  Completed, signed off  Medicare Important Message Given:  Yes-second notification given Date Medicare IM Given:    Medicare IM give by:    Date Additional Medicare IM Given:    Additional Medicare Important Message give by:     If discussed at [place] of Stay Meetings, dates discussed:    Additional Comments: NCM spoke to pt and states she does not wear her oxygen at all times. NCM explained the importance of wearing oxygen continuously. Requested portable tank for home. States she does not have anyone that can bring portable at time of dc. Contacted [facility] for portable tank for home. Provided pt with list of PCP's in this area that accepts her insurance. States she plans to switch PCP's.    Erenest Rasher, RN 07/21/2015, 2:23 PM

## 2015-07-21 NOTE — Progress Notes (Signed)
SATURATION QUALIFICATIONS: (This note is used to comply with regulatory documentation for home oxygen)  Patient Saturations on Room Air at Rest = 96  Patient Saturations on Room Air while Ambulating = 88  Patient Saturations on 2Liters of oxygen while Ambulating = 92-94  Please briefly explain why patient needs home oxygen: Patient could not walk much even on the 2l, she was very exhausted

## 2015-07-21 NOTE — Discharge Summary (Signed)
Physician Discharge Summary  Catherine Williams [id number] DOB: 19-Aug-1961 DOA: 07/16/2015  PCP: Philis Fendt, MD  Admit date: 07/16/2015 Discharge date: 07/21/2015  Time spent: >30 minutes  Recommendations for Outpatient Follow-up:  1. Reassess BP and adjust medications as needed 2. Check BMET to follow electrolytes and renal function 3. Repeat CBC to follow Hgb trend 4. Encourage patient and assist with weight loss program  Discharge Diagnoses:  Principal Problem:   Acute respiratory failure with hypoxia Active Problems:   OSA (obstructive sleep apnea)   CKD (chronic kidney disease) stage 3, GFR 30-59 ml/min   Diabetes mellitus type 2, controlled   Chronic anemia   Hypertension   Acute on chronic diastolic heart failure morbid obesity   Discharge Condition: stable and improved. Discharge home with instructions to follow with PCP in 10 days.  Diet recommendation: low sodium, low carbohydrates and low calorie diet  Filed Weights   07/19/15 0201 07/20/15 0628 07/21/15 0542  Weight: 153.9 kg (339 lb 4.6 oz) 158.883 kg (350 lb 4.4 oz) 157.5 kg (347 lb 3.6 oz)    History of present illness:  54 y.o. female with history of diastolic CHF last evaluation in 2010 was 45-50% with grade 1 diastolic dysfunction, peripheral arterial disease, diabetes mellitus type 2, hypertension, OSA, chronic kidney disease presents to the ER because of worsening shortness of breath over the last 1 month. In addition patient also has been having some left-sided chest pain off and on increasing on exertion with pressure-like nonradiating. Patient denies any fever or chills productive cough. Patient is stating that she feels very weak and fatigued. In the ER cardiac markers and chest x-ray were unremarkable and as per the ER physician EKG was not showing anything acute. D-dimer was mildly elevated but since patient has chronic kidney disease CT angiogram of the chest was unable to be done  Hospital Course:   1-acute resp failure with hypoxia: multifactorial: due to CHF exacerbation, OHS, OSA -will discharge on torsemide 40mg  BID  -continue low sodium diet and daily weights. . -Continue metoprolol -encourage to be compliant with CPAP at bedtime every night -advise to lose weight aggressively  -Patient found to be in need of oxygen supplementation at discharge; arranged   2-acute on chronic diastolic heart failure: preserved EF -will continue treatment as mentioned above for acute exacerbation -nitrates added for better BP control; BP is doing better. -patient educated about condition and importance of low sodium diet, daily weights and medication compliance  3-HTN: continue current antihypertensive regimen -advise to follow low sodium diet -hydralazine dose adjusted to BID  4-chronic renal failure: stage 3 at baseline -will recommend continue close monitoring of renal function, especially with adjustments to her diuretics -stable during hospitalization   5-chest pain:  -Most likely demand ischemia from CHF exacerbation versus GERD (patient at high risk given obesity) -troponin has been neg X 3 -no wall motion abnormalities able to be appreciated on 2-D echo -Denies further chest pain -will recommend the use of protonix daily  6-OSA:  -will encourage continue use of CPAP at bedtime (patient needs at least 6-8 hours on the machine).  7-COPD: continue home medication regimen.  -No wheezing currently  8-diabetes type 2: will continue SSI and lantus.  9-HLD: continue statins  10-Low grade temp: once and isolated -Wbc's within normal limits -No abdominal pain, no dysuria and no clear signs of infection appreciated -no antibiotics given   11-morbidly obese: Body mass index is 62.06 kg/(m^2). -Low calorie diet, increase exercise and  further education/assessment at bariatric center discussed with patient. -she is motivated and looking to make lifestyle  changes   Procedures:  V/Q scan: 07/17/15 Low probability study for PE   2-D echo: 07/17/15 - Left ventricle: Systolic function was normal. The estimated ejection fraction was in the range of 60% to 65%. Doppler parameters are consistent with abnormal left ventricular relaxation (grade 1 diastolic dysfunction). Doppler parameters are consistent with high ventricular filling pressure.  Impressions: - Extremely limited; definity used; LV function appears to be normal; focal wall motion abnormality cannot be excluded; grade 1 diastolic dysfunction with elevated LV filling pressure; doppler suboptimal; no other useful information can be obtained with this study.  Consultations:  None   Discharge Exam: Filed Vitals:   07/21/15 0542  BP: 150/62  Pulse: 81  Temp: 97.5 F (36.4 C)  Resp: 18    General: Patient without further fever. Significantly Improvement in her SOB. Still with some LE edema; but much better overall. Denies CP, dysuria, abdominal pain, nausea, vomiting or any other complaints. Patient asking to be discharge.  Cardiovascular: S1 and S2, no rubs or gallops; distant sounds; no murmurs   Respiratory: decrease BS at bases, no frank crackles, no wheezing and with improved air movement   Abdomen: soft, NT, ND, positive BS  Musculoskeletal: 1-2++ edema bilaterally   Discharge Instructions   Discharge Instructions    Diet - low sodium heart healthy    Complete by:  As directed      Discharge instructions    Complete by:  As directed   Arrange follow up with PCP in 10 days Take medications as prescribed Noticed adjustments in your hydralazine and also change to torsemide instead of lasix. Follow low sodium, low carbohydrates and low calorie diet Check your weight on daily basis (please contact PCP with any gained of more then 3 pounds overnight and/or more than 5 pounds in a week)          Current Discharge Medication List    START  taking these medications   Details  pantoprazole (PROTONIX) 40 MG tablet Take 1 tablet (40 mg total) by mouth daily. Qty: 30 tablet, Refills: 2    torsemide (DEMADEX) 20 MG tablet Take 2 tablets (40 mg total) by mouth 2 (two) times daily. Qty: 60 tablet, Refills: 2      CONTINUE these medications which have CHANGED   Details  hydrALAZINE (APRESOLINE) 25 MG tablet Take 1 tablet (25 mg total) by mouth 2 (two) times daily. Qty: 60 tablet, Refills: 1      CONTINUE these medications which have NOT CHANGED   Details  albuterol (PROVENTIL HFA;VENTOLIN HFA) 108 (90 BASE) MCG/ACT inhaler Inhale 1-2 puffs into the lungs every 6 (six) hours as needed for wheezing. Qty: 1 Inhaler, Refills: 0    amLODipine (NORVASC) 10 MG tablet Take 10 mg by mouth daily.      aspirin 81 MG tablet Take 81 mg by mouth daily.      budesonide-formoterol (SYMBICORT) 160-4.5 MCG/ACT inhaler Inhale 2 puffs into the lungs 2 (two) times daily. Qty: 1 Inhaler, Refills: 12    cyclobenzaprine (FLEXERIL) 10 MG tablet Take 10 mg by mouth 3 (three) times daily as needed for muscle spasms.    gabapentin (NEURONTIN) 100 MG capsule Take 100 mg by mouth at bedtime.    HYDROcodone-acetaminophen (NORCO) 10-325 MG per tablet Take 1 tablet by mouth every 6 (six) hours as needed for moderate pain.     insulin aspart (NOVOLOG)  100 UNIT/ML injection Inject 20 Units into the skin 3 (three) times daily with meals.     insulin glargine (LANTUS) 100 UNIT/ML injection Inject 70 Units into the skin at bedtime.     metoprolol (LOPRESSOR) 50 MG tablet Take 50 mg by mouth 2 (two) times daily.      rosuvastatin (CRESTOR) 40 MG tablet Take 40 mg by mouth every evening.     ipratropium (ATROVENT) 0.06 % nasal spray Place 2 sprays into both nostrils 4 (four) times daily. Qty: 15 mL, Refills: 1      STOP taking these medications     furosemide (LASIX) 80 MG tablet      amoxicillin-clavulanate (AUGMENTIN) 875-125 MG per tablet       levofloxacin (LEVAQUIN) 750 MG tablet      ondansetron (ZOFRAN ODT) 4 MG disintegrating tablet      predniSONE (DELTASONE) 20 MG tablet      promethazine (PHENERGAN) 25 MG suppository      traMADol-acetaminophen (ULTRACET) 37.5-325 MG per tablet        Allergies  Allergen Reactions  . Omnipaque [Iohexol] Nausea And Vomiting    Contrast Dye- Effects Kidney's   Follow-up Information    Follow up with AVBUERE,EDWIN A, MD. Schedule an appointment as soon as possible for a visit in 10 days.   Specialty:  Internal Medicine   Contact information:   [address] [place] [address] [phone]       Follow up with [facility].   Why:  will deliver oxygen to home.   Contact information:   [address] [place] [address] [phone]       The results of significant diagnostics from this hospitalization (including imaging, microbiology, ancillary and laboratory) are listed below for reference.    Significant Diagnostic Studies: Dg Chest 2 View  07/19/2015   CLINICAL DATA:  Fever and shortness of breath today.  EXAM: CHEST  2 VIEW  COMPARISON:  07/16/2015  FINDINGS: Mediastinal contours are unchanged, stable mild cardiac enlargement. No confluent airspace disease. No pleural effusion or pneumothorax. Detailed evaluation limited by soft tissue attenuation from body habitus.  IMPRESSION: No acute pulmonary process.   Electronically Signed   By: Jeb Levering M.D.   On: 07/19/2015 05:38   Dg Chest 2 View  07/16/2015   CLINICAL DATA:  Chronic shortness of breath, fluid retention, hypertension and diabetes. History COPD and CHF  EXAM: CHEST  2 VIEW  COMPARISON:  01/14/2015  FINDINGS: Limited exam because of obese body habitus. Stable mild cardiac enlargement with vascular and interstitial prominence. No definite superimposed edema, focal pneumonia, collapse or consolidation. No large effusion or pneumothorax. Trachea midline.  IMPRESSION: Stable  chest exam. No superimposed acute process by plain radiography   Electronically Signed   By: Jerilynn Mages.  Shick M.D.   On: 07/16/2015 22:01   Ct Head Wo Contrast  07/02/2015   CLINICAL DATA:  Headache and right facial numbness.  EXAM: CT HEAD WITHOUT CONTRAST  TECHNIQUE: Contiguous axial images were obtained from the base of the skull through the vertex without intravenous contrast.  COMPARISON:  None.  FINDINGS: The ventricles are normal in size and configuration. There is no intracranial mass, hemorrhage, extra-axial fluid collection, or midline shift. Gray-white compartments are normal. No acute infarct apparent. Bony calvarium appears intact. The mastoid air cells are clear.  IMPRESSION: Study within normal limits.   Electronically Signed   By: Lowella Grip III M.D.   On: 07/02/2015 20:05  Nm Pulmonary Perf And Vent  07/17/2015   CLINICAL DATA:  Shortness of breath and chest pain  EXAM: NUCLEAR MEDICINE VENTILATION - PERFUSION LUNG SCAN  Views: Anterior, posterior, RAO, LAO, RPO, LPO -ventilation and perfusion  Radionuclide: Technetium 69m DTPA -ventilation; Technetium 87m macroaggregated albumin- perfusion  Dose:  40.5 mCi-ventilation; 6.3 mCi-perfusion  Route of administration: Inhalation -ventilation ; intravenous -perfusion  COMPARISON:  Chest radiograph July 16, 2015  FINDINGS: Ventilation: Radiotracer uptake is homogeneous and symmetric bilaterally. No ventilation defects are identified.  Perfusion: Radiotracer uptake is homogeneous and symmetric bilaterally. No perfusion defects are identified.  IMPRESSION: No appreciable ventilation or perfusion defects. This study constitutes a very low probability of pulmonary embolus.   Electronically Signed   By: Lowella Grip III M.D.   On: 07/17/2015 16:29    Microbiology: Recent Results (from the past 240 hour(s))  Culture, blood (routine x 2)     Status: None (Preliminary result)   Collection Time: 07/19/15  5:30 AM  Result Value Ref Range Status    Specimen Description BLOOD LEFT HAND  Final   Special Requests IN PEDIATRIC BOTTLE 4CC  Final   Culture NO GROWTH 1 DAY  Final   Report Status PENDING  Incomplete  Culture, blood (routine x 2)     Status: None (Preliminary result)   Collection Time: 07/19/15  5:35 AM  Result Value Ref Range Status   Specimen Description BLOOD RIGHT ANTECUBITAL  Final   Special Requests BOTTLES DRAWN AEROBIC ONLY 10CC  Final   Culture NO GROWTH 1 DAY  Final   Report Status PENDING  Incomplete  Culture, Urine     Status: None   Collection Time: 07/19/15  7:39 PM  Result Value Ref Range Status   Specimen Description URINE, RANDOM  Final   Special Requests Normal  Final   Culture MULTIPLE SPECIES PRESENT, SUGGEST RECOLLECTION  Final   Report Status 07/21/2015 FINAL  Final     Labs: Basic Metabolic Panel:  Recent Labs Lab 07/16/15 2105 07/16/15 2117 07/17/15 0209 07/17/15 0833 07/19/15 0535 07/21/15 0422  NA 141 143  --  143 140 137  K 3.9 3.8  --  4.6 4.3 4.6  CL 102 101  --  102 98* 98*  CO2 29  --   --  32 32 30  GLUCOSE 87 87  --  159* 113* 130*  BUN 43* 44*  --  44* 56* 64*  CREATININE 2.55* 2.40* 2.61* 2.51* 2.68* 2.80*  CALCIUM 9.5  --   --  9.6 9.4 9.6   Liver Function Tests:  Recent Labs Lab 07/16/15 2105 07/17/15 0833 07/19/15 0535  AST 18 15 13*  ALT 15 13* 12*  ALKPHOS 68 66 64  BILITOT 0.7 0.5 0.6  PROT 7.4 6.8 6.9  ALBUMIN 3.3* 3.0* 2.8*   CBC:  Recent Labs Lab 07/16/15 2105 07/16/15 2117 07/17/15 0209 07/17/15 0833 07/19/15 0535  WBC 9.9  --  10.6* 10.5 9.6  NEUTROABS 6.8  --   --  7.9* 7.3  HGB 10.3* 12.6 9.9* 10.1* 9.2*  HCT 35.0* 37.0 33.6* 35.0* 31.7*  MCV 85.2  --  85.9 86.6 85.2  PLT 243  --  224 238 254   Cardiac Enzymes:  Recent Labs Lab 07/17/15 0209 07/17/15 0833 07/17/15 1442  TROPONINI <0.03 <0.03 <0.03   BNP: BNP (last 3 results)  Recent Labs  01/14/15 1052 07/16/15 2105  BNP 70.0 66.2    ProBNP (last 3 results)  Recent  Labs  11/22/14 1854  PROBNP 279.8*    CBG:  Recent Labs Lab 07/20/15 1115 07/20/15 1649 07/20/15 2130 07/21/15 0613 07/21/15 1137  GLUCAP 182* 101* 97 137* 113*    Signed:  Barton Dubois  [facility] 07/21/2015, 1:08 PM

## 2015-07-24 LAB — CULTURE, BLOOD (ROUTINE X 2)
CULTURE: NO GROWTH
Culture: NO GROWTH

## 2015-07-28 DIAGNOSIS — Z124 Encounter for screening for malignant neoplasm of cervix: Secondary | ICD-10-CM | POA: Diagnosis not present

## 2015-07-28 DIAGNOSIS — E1140 Type 2 diabetes mellitus with diabetic neuropathy, unspecified: Secondary | ICD-10-CM | POA: Diagnosis not present

## 2015-07-28 DIAGNOSIS — G44209 Tension-type headache, unspecified, not intractable: Secondary | ICD-10-CM | POA: Diagnosis not present

## 2015-07-28 DIAGNOSIS — Z6841 Body Mass Index (BMI) 40.0 and over, adult: Secondary | ICD-10-CM | POA: Diagnosis not present

## 2015-07-28 DIAGNOSIS — I509 Heart failure, unspecified: Secondary | ICD-10-CM | POA: Diagnosis not present

## 2015-07-28 DIAGNOSIS — I10 Essential (primary) hypertension: Secondary | ICD-10-CM | POA: Diagnosis not present

## 2015-07-28 DIAGNOSIS — J449 Chronic obstructive pulmonary disease, unspecified: Secondary | ICD-10-CM | POA: Diagnosis not present

## 2015-08-19 DIAGNOSIS — N183 Chronic kidney disease, stage 3 (moderate): Secondary | ICD-10-CM | POA: Diagnosis not present

## 2015-08-19 DIAGNOSIS — I129 Hypertensive chronic kidney disease with stage 1 through stage 4 chronic kidney disease, or unspecified chronic kidney disease: Secondary | ICD-10-CM | POA: Diagnosis not present

## 2015-08-19 DIAGNOSIS — E1129 Type 2 diabetes mellitus with other diabetic kidney complication: Secondary | ICD-10-CM | POA: Diagnosis not present

## 2015-08-19 DIAGNOSIS — N2581 Secondary hyperparathyroidism of renal origin: Secondary | ICD-10-CM | POA: Diagnosis not present

## 2015-08-19 DIAGNOSIS — D509 Iron deficiency anemia, unspecified: Secondary | ICD-10-CM | POA: Diagnosis not present

## 2015-08-27 ENCOUNTER — Ambulatory Visit (INDEPENDENT_AMBULATORY_CARE_PROVIDER_SITE_OTHER): Payer: Medicare Other | Admitting: Internal Medicine

## 2015-08-27 ENCOUNTER — Encounter: Payer: Self-pay | Admitting: Internal Medicine

## 2015-08-27 VITALS — BP 122/74 | HR 69 | Ht 63.0 in | Wt 346.6 lb

## 2015-08-27 DIAGNOSIS — R0600 Dyspnea, unspecified: Secondary | ICD-10-CM | POA: Diagnosis not present

## 2015-08-27 DIAGNOSIS — G4733 Obstructive sleep apnea (adult) (pediatric): Secondary | ICD-10-CM | POA: Diagnosis not present

## 2015-08-27 NOTE — Progress Notes (Signed)
Subjective:    Patient ID: Catherine Williams, female    DOB: 1961-07-17,    MRN: [id number]  HPI  42 yobf never cig smoker no resp problems as child smoked a lot of MJ as adult quit smoking completely 2004  but around 2010 at wt around 300  dx as copd and never had pfts until 08/27/2015 with restrictive changes only / placed on cpap x 2014 and referred to pulmonary clinic by Dr Jackson Latino  08/27/2015  p admit and newly started on 02 :  Admit date: 07/16/2015 Discharge date: 07/21/2015     Recommendations for Outpatient Follow-up:  1. Reassess BP and adjust medications as needed 2. Check BMET to follow electrolytes and renal function 3. Repeat CBC to follow Hgb trend 4. Encourage patient and assist with weight loss program  Discharge Diagnoses:  Principal Problem:  Acute respiratory failure with hypoxia   OSA (obstructive sleep apnea)  CKD (chronic kidney disease) stage 3, GFR 30-59 ml/min  Diabetes mellitus type 2, controlled  Chronic anemia  Hypertension  Acute on chronic diastolic heart failure   Morbid obesity    08/27/2015  Pulmonary office visit/ Yesli Vanderhoff  (had seen Sleep medicine = [facility] in 2014)  Chief Complaint  Patient presents with  . Pulmonary Consult    Referred by Dr. Gertie Gowda. Pt states that she was dxed with COPD in 2012. She states she only developed problem with her breathing approx 1 yr ago. She states that she gets SOB with grocery shopping and minimal exertion.    sob  occurs with shopping but also with getting dressed at wt now of   346/ never symptoms at rest or noct on cpap/02 per advanced.   No obvious  day to day or daytime variabilty or assoc chronic cough or cp or chest tightness, subjective wheeze overt sinus or hb symptoms. No unusual exp hx or h/o childhood pna/ asthma or knowledge of premature birth.  Sleeping ok without nocturnal  or early am exacerbation  of respiratory  c/o's or need for noct saba. Also denies any obvious fluctuation of  symptoms with weather or environmental changes or other aggravating or alleviating factors except as outlined above   Current Medications, Allergies, Complete Past Medical History, Past Surgical History, Family History, and Social History were reviewed in [employer] record.              Review of Systems  Constitutional: Negative for fever, chills and unexpected weight change.  HENT: Negative for congestion, dental problem, ear pain, nosebleeds, postnasal drip, rhinorrhea, sinus pressure, sneezing, sore throat, trouble swallowing and voice change.   Eyes: Negative for visual disturbance.  Respiratory: Positive for shortness of breath. Negative for cough and choking.   Cardiovascular: Negative for chest pain and leg swelling.  Gastrointestinal: Negative for vomiting, abdominal pain and diarrhea.  Genitourinary: Negative for difficulty urinating.  Musculoskeletal: Negative for arthralgias.  Skin: Negative for rash.  Neurological: Negative for tremors, syncope and headaches.  Hematological: Does not bruise/bleed easily.       Objective:   Physical Exam  amb massively obese  bf nad  Wt Readings from Last 3 Encounters:  08/27/15 346 lb 9.6 oz (157.217 kg)  07/21/15 347 lb 3.6 oz (157.5 kg)  07/02/15 342 lb (155.13 kg)    Vital signs reviewed   HEENT: edentulous/ nl turbinates, and orophanx. Nl external ear canals without cough reflex   NECK :  without JVD/Nodes/TM/ nl carotid upstrokes bilaterally  LUNGS: no acc muscle use, clear to A and P bilaterally without cough on insp or exp maneuvers   CV:  RRR  no s3 or murmur or increase in P2, no edema   ABD:  soft and nontender with nl excursion in the supine position. No bruits or organomegaly, bowel sounds nl  MS:  warm without deformities, calf tenderness, cyanosis or clubbing  SKIN: warm and dry without lesions    NEURO:  alert, approp, no deficits    I personally reviewed images and agree  with radiology impression as follows:  CXR:  07/19/15 No acute pulmonary process.    Chemistry      Component Value Date/Time   NA 137 07/21/2015 0422   K 4.6 07/21/2015 0422   CL 98* 07/21/2015 0422   CO2 30 07/21/2015 0422   BUN 64* 07/21/2015 0422   CREATININE 2.80* 07/21/2015 0422      Component Value Date/Time   CALCIUM 9.6 07/21/2015 0422   CALCIUM 8.0* 09/20/2011 1800   ALKPHOS 64 07/19/2015 0535   AST 13* 07/19/2015 0535   ALT 12* 07/19/2015 0535   BILITOT 0.6 07/19/2015 0535         Lab Results  Component Value Date   TSH 3.921 12/13/2010           Assessment & Plan:

## 2015-08-27 NOTE — Patient Instructions (Signed)
Please see patient coordinator before you leave today  to schedule ono while on your set up of 02 thru the cpap  Machine.  Please schedule a follow up office visit in 6 weeks, call sooner if needed with full pfts

## 2015-08-28 ENCOUNTER — Encounter: Payer: Self-pay | Admitting: Internal Medicine

## 2015-08-28 NOTE — Assessment & Plan Note (Signed)
Body mass index is 61.41 kg/(m^2).  Lab Results  Component Value Date   TSH 3.921 12/13/2010     Contributing to gerd tendency/ doe/leg/ back pain walking >>>reviewed need  achieve and maintain neg calorie balance > defer f/u primary care including intermittently monitoring thyroid status

## 2015-08-28 NOTE — Assessment & Plan Note (Addendum)
-    Spirometry 08/27/2015  FEV1  1.0 (50%) ratio 74 - 08/27/2015  Walked RA  2 laps @ 185 ft each stopped due to  Back/leg pain no sob at slow pace, no desat   Symptoms are markedly disproportionate to objective findings and not clear this is a lung problem but pt does appear to have difficult airway management issues. DDX of  difficult airways management all start with A and  include Adherence, Ace Inhibitors, Acid Reflux, Active Sinus Disease, Alpha 1 Antitripsin deficiency, Anxiety masquerading as Airways dz,  ABPA,  allergy(esp in young), Aspiration (esp in elderly), Adverse effects of meds,  Active smokers, A bunch of PE's (a small clot burden can't cause this syndrome unless there is already severe underlying pulm or vascular dz with poor reserve) plus two Bs  = Bronchiectasis and Beta blocker use..and one C= CHF  Adherence is always the initial "prime suspect" and is a multilayered concern that requires a "trust but verify" approach in every patient - starting with knowing how to use medications, especially inhalers, correctly, keeping up with refills and understanding the fundamental difference between maintenance and prns vs those medications only taken for a very short course and then stopped and not refilled.  - The proper method of use, as well as anticipated side effects, of a metered-dose inhaler are discussed and demonstrated to the patient. Improved effectiveness after extensive coaching during this visit to a level of approximately  75%  ? Allergy/asthma > not excluded by spirometry today so reasonable to continue on Symbicort or alternatively try off to see if any symptoms worsen   ? Acid (or non-acid) GERD > always difficult to exclude as up to 75% of pts in some series report no assoc GI/ Heartburn symptoms> rec continue  max (24h)  acid suppression and diet restrictions/ reviewed  ? chf  07/17/15 > Echo c/w Grade I diastolic dysfunction only    For now I would focus on wt loss/ no  change in meds  Total time = 30m review case with pt/ discussion/ counseling/ giving and going over instructions (see avs)

## 2015-08-28 NOTE — Assessment & Plan Note (Signed)
NPSG 04/2011:  AHI 89/hr, desat to 47% Auto 06/2013:  Optimal pressure 16cm - added 02 2lpm at hs 06/2015 - ono on 02 2lpm and cpap rec 08/27/2015 >>>   Will need to set her up for reevaluation by sleep medicine

## 2015-09-01 DIAGNOSIS — I509 Heart failure, unspecified: Secondary | ICD-10-CM | POA: Diagnosis not present

## 2015-09-01 DIAGNOSIS — Z6841 Body Mass Index (BMI) 40.0 and over, adult: Secondary | ICD-10-CM | POA: Diagnosis not present

## 2015-09-01 DIAGNOSIS — E1140 Type 2 diabetes mellitus with diabetic neuropathy, unspecified: Secondary | ICD-10-CM | POA: Diagnosis not present

## 2015-09-01 DIAGNOSIS — I10 Essential (primary) hypertension: Secondary | ICD-10-CM | POA: Diagnosis not present

## 2015-09-14 DIAGNOSIS — E11359 Type 2 diabetes mellitus with proliferative diabetic retinopathy without macular edema: Secondary | ICD-10-CM | POA: Diagnosis not present

## 2015-09-14 DIAGNOSIS — E1139 Type 2 diabetes mellitus with other diabetic ophthalmic complication: Secondary | ICD-10-CM | POA: Diagnosis not present

## 2015-09-14 DIAGNOSIS — H4010X1 Unspecified open-angle glaucoma, mild stage: Secondary | ICD-10-CM | POA: Diagnosis not present

## 2015-09-14 DIAGNOSIS — H359 Unspecified retinal disorder: Secondary | ICD-10-CM | POA: Diagnosis not present

## 2015-09-21 ENCOUNTER — Telehealth: Payer: Self-pay | Admitting: Internal Medicine

## 2015-09-21 DIAGNOSIS — G4733 Obstructive sleep apnea (adult) (pediatric): Secondary | ICD-10-CM

## 2015-09-21 NOTE — Telephone Encounter (Signed)
Per MW- ONO done on 2lpm with CPAP still too low  Needs to increased o2 3lpm with CPAP and repeat ONO  Spoke with pt and notified of results per Dr. Melvyn Novas. Pt verbalized understanding and denied any questions. Orders sent to [facility]

## 2015-10-06 DIAGNOSIS — H4010X1 Unspecified open-angle glaucoma, mild stage: Secondary | ICD-10-CM | POA: Diagnosis not present

## 2015-10-08 ENCOUNTER — Ambulatory Visit: Payer: Medicaid Other | Admitting: Certified Nurse Midwife

## 2015-10-12 ENCOUNTER — Other Ambulatory Visit (HOSPITAL_COMMUNITY)
Admission: RE | Admit: 2015-10-12 | Discharge: 2015-10-12 | Disposition: A | Discharge status: Home / Self Care | Payer: Medicare Other | Source: Ambulatory Visit | Attending: Obstetrics and Gynecology | Admitting: Obstetrics and Gynecology

## 2015-10-12 ENCOUNTER — Encounter: Payer: Self-pay | Admitting: Obstetrics and Gynecology

## 2015-10-12 ENCOUNTER — Ambulatory Visit (INDEPENDENT_AMBULATORY_CARE_PROVIDER_SITE_OTHER): Payer: Medicare Other | Admitting: Obstetrics and Gynecology

## 2015-10-12 VITALS — BP 145/46 | HR 65 | Ht 63.0 in | Wt 345.0 lb

## 2015-10-12 DIAGNOSIS — Z01419 Encounter for gynecological examination (general) (routine) without abnormal findings: Secondary | ICD-10-CM | POA: Diagnosis not present

## 2015-10-12 DIAGNOSIS — Z1151 Encounter for screening for human papillomavirus (HPV): Secondary | ICD-10-CM

## 2015-10-12 DIAGNOSIS — Z124 Encounter for screening for malignant neoplasm of cervix: Secondary | ICD-10-CM

## 2015-10-12 NOTE — Progress Notes (Signed)
Subjective:     Catherine Williams is a 54 y.o. female G3P3 with LMP in January 2016 and BMI 66 who is here for a comprehensive physical exam. The patient reports no problems. She is not sexually active. She denies any urinary incontinence. She reports occasional mood swings and irritability.  Social History   Social History  . Marital Status: Single    Spouse Name: N/A  . Number of Children: Y  . Years of Education: N/A   Occupational History  . not working     prev worked in Software engineer.   Social History Main Topics  . Smoking status: Never Smoker   . Smokeless tobacco: Never Used  . Alcohol Use: No  . Drug Use: Yes    Special: Marijuana     Comment: FORMER>>smoked weed from age 63 to 38>> quit at age 52   . Sexual Activity: Not on file   Other Topics Concern  . Not on file   Social History Narrative   Lives with daughter         Health Maintenance  Topic Date Due  . Hepatitis C Screening  19-May-1961  . PNEUMOCOCCAL POLYSACCHARIDE VACCINE (1) 01/08/1961  . FOOT EXAM  01/08/1969  . OPHTHALMOLOGY EXAM  01/08/1969  . HIV Screening  01/08/1974  . TETANUS/TDAP  01/08/1978  . PAP SMEAR  01/09/1980  . COLONOSCOPY  01/08/2009  . URINE MICROALBUMIN  10/23/2010  . INFLUENZA VACCINE  03/18/2016 (Originally 07/20/2015)  . HEMOGLOBIN A1C  01/17/2016  . MAMMOGRAM  05/12/2017   Past Medical History  Diagnosis Date  . Hypertension   . Heart failure   . Asthma   . Diabetes mellitus   . Hyperlipidemia   . Kidney disease   . Pinched nerve   . Morbid obesity ([place])   . COPD (chronic obstructive pulmonary disease) ([place])   . Sleep apnea   . Peripheral vascular disease ([place])   . Arthritis Dec. 2014    Gout  . Anemia   . CHF (congestive heart failure) [facility])    Past Surgical History  Procedure Laterality Date  . Ovary surgery      Left  . Hand surgery      right  . Atherectomy and angioplasty  10/18/2011    left posterior tibial artery  . Toe amputation  Sept. 25,2012   Left 4th and 5th toes   Family History  Problem Relation Age of Onset  . Lung cancer Mother   . Clotting disorder Mother   . Heart disease Mother   . Cancer Mother     Lung  . Deep vein thrombosis Mother   . Diabetes Mother   . Hypertension Mother   . Varicose Veins Mother   . Cancer Father   . Clotting disorder Sister   . Heart attack Sister   . Diabetes Sister   . Clotting disorder Brother   . Deep vein thrombosis Brother   . Diabetes Brother   . Heart disease Sister   . Heart disease Brother        Review of Systems Pertinent items are noted in HPI.   Objective:      GENERAL: Well-developed, well-nourished female in no acute distress.  HEENT: Normocephalic, atraumatic. Sclerae anicteric.  NECK: Supple. Normal thyroid.  LUNGS: Clear to auscultation bilaterally.  HEART: Regular rate and rhythm. BREASTS: Symmetric in size. No palpable masses or lymphadenopathy, skin changes, or nipple drainage. ABDOMEN: Soft, nontender, nondistended. Obese PELVIC: Normal external female genitalia. Vagina is  pink and rugated.  Normal discharge. Normal appearing cervix. Bimanual exam limited secondary to body habitus EXTREMITIES: No cyanosis, clubbing, or edema, 2+ distal pulses.    Assessment:    Healthy female exam.      Plan:    pap smear collected Normal mammogram this year Advised to perform monthly self breast exam Discussed weight loss strategies through diet and exercise See After Visit Summary for Counseling Recommendations

## 2015-10-13 ENCOUNTER — Encounter: Payer: Self-pay | Admitting: *Deleted

## 2015-10-14 LAB — CYTOLOGY - PAP

## 2015-10-15 ENCOUNTER — Telehealth: Payer: Self-pay | Admitting: Internal Medicine

## 2015-10-15 NOTE — Telephone Encounter (Signed)
Per MW ONO on CPAP and 3lpm was normal  Spoke with pt and notified of results per Dr. Melvyn Novas. Pt verbalized understanding and denied any questions.  (test done by [facility] 10/07/15)

## 2015-10-16 ENCOUNTER — Encounter: Payer: Self-pay | Admitting: Internal Medicine

## 2015-10-21 ENCOUNTER — Ambulatory Visit (INDEPENDENT_AMBULATORY_CARE_PROVIDER_SITE_OTHER): Payer: Medicare Other | Admitting: Internal Medicine

## 2015-10-21 ENCOUNTER — Encounter: Payer: Self-pay | Admitting: Internal Medicine

## 2015-10-21 VITALS — BP 132/72 | HR 77 | Ht 63.0 in | Wt 344.0 lb

## 2015-10-21 DIAGNOSIS — R0600 Dyspnea, unspecified: Secondary | ICD-10-CM | POA: Diagnosis not present

## 2015-10-21 DIAGNOSIS — G4733 Obstructive sleep apnea (adult) (pediatric): Secondary | ICD-10-CM | POA: Diagnosis not present

## 2015-10-21 LAB — PULMONARY FUNCTION TEST
DL/VA % pred: 106 %
DL/VA: 5 ml/min/mmHg/L
DLCO UNC: 14.29 ml/min/mmHg
DLCO unc % pred: 62 %
FEF 25-75 Post: 1.43 L/sec
FEF 25-75 Pre: 1.1 L/sec
FEF2575-%Change-Post: 30 %
FEF2575-%PRED-POST: 67 %
FEF2575-%Pred-Pre: 51 %
FEV1-%CHANGE-POST: 7 %
FEV1-%PRED-PRE: 57 %
FEV1-%Pred-Post: 62 %
FEV1-Post: 1.29 L
FEV1-Pre: 1.2 L
FEV1FVC-%CHANGE-POST: -1 %
FEV1FVC-%PRED-PRE: 100 %
FEV6-%Change-Post: 8 %
FEV6-%Pred-Post: 64 %
FEV6-%Pred-Pre: 59 %
FEV6-POST: 1.64 L
FEV6-Pre: 1.5 L
FEV6FVC-%PRED-POST: 103 %
FEV6FVC-%Pred-Pre: 103 %
FVC-%Change-Post: 8 %
FVC-%PRED-POST: 62 %
FVC-%PRED-PRE: 57 %
FVC-POST: 1.64 L
FVC-PRE: 1.5 L
PRE FEV1/FVC RATIO: 80 %
PRE FEV6/FVC RATIO: 100 %
Post FEV1/FVC ratio: 79 %
Post FEV6/FVC ratio: 100 %
RV % pred: 94 %
RV: 1.76 L
TLC % pred: 69 %
TLC: 3.41 L

## 2015-10-21 NOTE — Assessment & Plan Note (Signed)
-    Spirometry 08/27/2015  FEV1  1.0 (50%) ratio 74 - 08/27/2015  Walked RA  2 laps @ 185 ft each stopped due to  Back/leg pain no sob at slow pace, no desat  - PFT's  10/21/2015  FEV1 1.29 (62 % ) ratio 79  p 7 % improvement from saba with DLCO  62 % corrects to 106  % for alv volume    Clearly has restrictive, not obst pattern, off all inhalers, and needs to focus on wt loss next   I had an extended summary final  discussion with the patient reviewing all relevant studies completed to date and  lasting 15 to 20 minutes of a 25 minute visit    Each maintenance medication was reviewed in detail including most importantly the difference between maintenance and prns and under what circumstances the prns are to be triggered using an action plan format that is not reflected in the computer generated alphabetically organized AVS.    Please see instructions for details which were reviewed in writing and the patient given a copy highlighting the part that I personally wrote and discussed at today's ov.

## 2015-10-21 NOTE — Progress Notes (Signed)
PFT done today. 

## 2015-10-21 NOTE — Assessment & Plan Note (Signed)
Body mass index is 60.95   Lab Results  Component Value Date   TSH 3.921 12/13/2010     Contributing to gerd tendency/ doe/reviewed the need and the process to achieve and maintain neg calorie balance > defer f/u primary care including intermittently monitoring thyroid status

## 2015-10-21 NOTE — Patient Instructions (Addendum)
Work on the [employer] loss   Get Dr C to check your TSH at next blood check  GERD (REFLUX)  is an extremely common cause of respiratory symptoms just like yours , many times with no obvious heartburn at all.    It can be treated with medication, but also with lifestyle changes including elevation of the head of your bed (ideally with 6 inch  bed blocks),  Smoking cessation, avoidance of late meals, excessive alcohol, and avoid fatty foods, chocolate, peppermint, colas, red wine, and acidic juices such as orange juice.  NO MINT OR MENTHOL PRODUCTS SO NO COUGH DROPS  USE SUGARLESS CANDY INSTEAD (Jolley ranchers or Stover's or Life Savers) or even ice chips will also do - the key is to swallow to prevent all throat clearing. NO OIL BASED VITAMINS - use powdered substitutes.    Pulmonary follow up is as needed

## 2015-10-21 NOTE — Assessment & Plan Note (Signed)
NPSG 04/2011:  AHI 89/hr, desat to 47% Auto 06/2013:  Optimal pressure 16cm - added 02 2lpm at hs 06/2015 - ono on 02 2lpm and cpap  08/21/15 still desat< 88% x 78 m > rec repeat on 3lpm /cpap 10/07/15 no desats > no change rx  F/u sleep doc prn when recertification needed

## 2015-10-21 NOTE — Progress Notes (Signed)
   Subjective:    Patient ID: Catherine Williams, female    DOB: 03-Jun-1961     MRN: [id number]    Brief patient profile:  83 yobf never cig smoker no resp problems as child smoked a lot of MJ as adult quit smoking completely 2004  but around 2010 at wt around 300  dx as copd and never had pfts until 08/27/2015 with restrictive changes only / placed on cpap x 2014 and referred to pulmonary clinic by Dr Jackson Latino  08/27/2015  p admit and newly started on 02 :  Admit date: 07/16/2015 Discharge date: 07/21/2015     Recommendations for Outpatient Follow-up:  1. Reassess BP and adjust medications as needed 2. Check BMET to follow electrolytes and renal function 3. Repeat CBC to follow Hgb trend 4. Encourage patient and assist with weight loss program  Discharge Diagnoses:  Principal Problem:  Acute respiratory failure with hypoxia   OSA (obstructive sleep apnea)  CKD (chronic kidney disease) stage 3, GFR 30-59 ml/min  Diabetes mellitus type 2, controlled  Chronic anemia  Hypertension  Acute on chronic diastolic heart failure   Morbid obesity    08/27/2015  Pulmonary office visit/ Catherine Williams  (had seen Sleep medicine = Clance in 2014)  Chief Complaint  Patient presents with  . Pulmonary Consult    Referred by Dr. Gertie Gowda. Pt states that she was dxed with COPD in 2012. She states she only developed problem with her breathing approx 1 yr ago. She states that she gets SOB with grocery shopping and minimal exertion.    sob  occurs with shopping but also with getting dressed at wt now of   346/ never symptoms at rest or noct on cpap/02 per advanced.  rec                     Objective:   Physical Exam  amb massively obese  bf nad  10/21/2015          344  Wt Readings from Last 3 Encounters:  08/27/15 346 lb 9.6 oz (157.217 kg)  07/21/15 347 lb 3.6 oz (157.5 kg)  07/02/15 342 lb (155.13 kg)    Vital signs reviewed   HEENT: edentulous/ nl turbinates, and orophanx. Nl  external ear canals without cough reflex   NECK :  without JVD/Nodes/TM/ nl carotid upstrokes bilaterally   LUNGS: no acc muscle use, clear to A and P bilaterally without cough on insp or exp maneuvers   CV:  RRR  no s3 or murmur or increase in P2, no edema   ABD:  soft and nontender with nl excursion in the supine position. No bruits or organomegaly, bowel sounds nl  MS:  warm without deformities, calf tenderness, cyanosis or clubbing  SKIN: warm and dry without lesions    NEURO:  alert, approp, no deficits       I personally reviewed images and agree with radiology impression as follows:  CXR:  07/19/15 No acute pulmonary process.                Assessment & Plan:

## 2015-10-29 DIAGNOSIS — E784 Other hyperlipidemia: Secondary | ICD-10-CM | POA: Diagnosis not present

## 2015-10-29 DIAGNOSIS — I10 Essential (primary) hypertension: Secondary | ICD-10-CM | POA: Diagnosis not present

## 2015-10-29 DIAGNOSIS — I509 Heart failure, unspecified: Secondary | ICD-10-CM | POA: Diagnosis not present

## 2015-10-29 DIAGNOSIS — Z6841 Body Mass Index (BMI) 40.0 and over, adult: Secondary | ICD-10-CM | POA: Diagnosis not present

## 2015-10-29 DIAGNOSIS — E1140 Type 2 diabetes mellitus with diabetic neuropathy, unspecified: Secondary | ICD-10-CM | POA: Diagnosis not present

## 2015-10-29 DIAGNOSIS — M1A9XX0 Chronic gout, unspecified, without tophus (tophi): Secondary | ICD-10-CM | POA: Diagnosis not present

## 2015-11-02 ENCOUNTER — Encounter: Payer: Self-pay | Admitting: Internal Medicine

## 2015-12-16 ENCOUNTER — Telehealth: Payer: Self-pay | Admitting: Internal Medicine

## 2015-12-16 DIAGNOSIS — J9601 Acute respiratory failure with hypoxia: Secondary | ICD-10-CM

## 2015-12-16 NOTE — Telephone Encounter (Signed)
Spoke with pt, aware of recs.  Requesting that we call [place] to make sure they are aware of MW's approval of this.   Called [facility], states that we need to send an order to [facility] stating that MW is ok with pt using 2lpm qhs while on vacation.  This has been sent.  ntohing further needed.

## 2015-12-16 NOTE — Telephone Encounter (Signed)
That's fine

## 2015-12-16 NOTE — Telephone Encounter (Signed)
Called spoke with pt. She reports we increase her O2 with CPAP to 3 liters. She is having to go out of town x 2 weeks. [facility] will provide her with a portable O2 but it only goes up to 2 liters. She wants to know if Dr. Melvyn Novas would okay this while she is away. Please advise MW thanks

## 2016-01-01 DIAGNOSIS — I509 Heart failure, unspecified: Secondary | ICD-10-CM | POA: Diagnosis not present

## 2016-01-01 DIAGNOSIS — M109 Gout, unspecified: Secondary | ICD-10-CM | POA: Diagnosis not present

## 2016-01-01 DIAGNOSIS — E1129 Type 2 diabetes mellitus with other diabetic kidney complication: Secondary | ICD-10-CM | POA: Diagnosis not present

## 2016-01-01 DIAGNOSIS — D509 Iron deficiency anemia, unspecified: Secondary | ICD-10-CM | POA: Diagnosis not present

## 2016-01-01 DIAGNOSIS — N183 Chronic kidney disease, stage 3 (moderate): Secondary | ICD-10-CM | POA: Diagnosis not present

## 2016-01-01 DIAGNOSIS — I129 Hypertensive chronic kidney disease with stage 1 through stage 4 chronic kidney disease, or unspecified chronic kidney disease: Secondary | ICD-10-CM | POA: Diagnosis not present

## 2016-01-01 DIAGNOSIS — G4730 Sleep apnea, unspecified: Secondary | ICD-10-CM | POA: Diagnosis not present

## 2016-01-01 DIAGNOSIS — R809 Proteinuria, unspecified: Secondary | ICD-10-CM | POA: Diagnosis not present

## 2016-01-01 DIAGNOSIS — N2581 Secondary hyperparathyroidism of renal origin: Secondary | ICD-10-CM | POA: Diagnosis not present

## 2016-01-12 ENCOUNTER — Encounter: Payer: Self-pay | Admitting: Family

## 2016-01-14 ENCOUNTER — Inpatient Hospital Stay (HOSPITAL_COMMUNITY)
Admission: RE | Admit: 2016-01-14 | Discharge: 2016-01-14 | Disposition: A | Discharge status: Home / Self Care | Payer: Medicare Other | Source: Ambulatory Visit | Attending: Family | Admitting: Family

## 2016-01-14 DIAGNOSIS — I739 Peripheral vascular disease, unspecified: Secondary | ICD-10-CM

## 2016-01-14 DIAGNOSIS — Z9889 Other specified postprocedural states: Secondary | ICD-10-CM

## 2016-01-21 ENCOUNTER — Ambulatory Visit: Payer: Medicare Other | Admitting: Family

## 2016-02-25 DIAGNOSIS — N184 Chronic kidney disease, stage 4 (severe): Secondary | ICD-10-CM | POA: Diagnosis not present

## 2016-02-25 DIAGNOSIS — J449 Chronic obstructive pulmonary disease, unspecified: Secondary | ICD-10-CM | POA: Diagnosis not present

## 2016-02-25 DIAGNOSIS — I10 Essential (primary) hypertension: Secondary | ICD-10-CM | POA: Diagnosis not present

## 2016-02-25 DIAGNOSIS — I509 Heart failure, unspecified: Secondary | ICD-10-CM | POA: Diagnosis not present

## 2016-02-25 DIAGNOSIS — E1140 Type 2 diabetes mellitus with diabetic neuropathy, unspecified: Secondary | ICD-10-CM | POA: Diagnosis not present

## 2016-02-25 DIAGNOSIS — J019 Acute sinusitis, unspecified: Secondary | ICD-10-CM | POA: Diagnosis not present

## 2016-04-15 DIAGNOSIS — R0602 Shortness of breath: Secondary | ICD-10-CM | POA: Diagnosis not present

## 2016-04-15 DIAGNOSIS — E662 Morbid (severe) obesity with alveolar hypoventilation: Secondary | ICD-10-CM | POA: Diagnosis not present

## 2016-04-15 DIAGNOSIS — I10 Essential (primary) hypertension: Secondary | ICD-10-CM | POA: Diagnosis not present

## 2016-04-15 DIAGNOSIS — R002 Palpitations: Secondary | ICD-10-CM | POA: Diagnosis not present

## 2016-04-27 DIAGNOSIS — E1129 Type 2 diabetes mellitus with other diabetic kidney complication: Secondary | ICD-10-CM | POA: Diagnosis not present

## 2016-04-27 DIAGNOSIS — D509 Iron deficiency anemia, unspecified: Secondary | ICD-10-CM | POA: Diagnosis not present

## 2016-04-27 DIAGNOSIS — G4730 Sleep apnea, unspecified: Secondary | ICD-10-CM | POA: Diagnosis not present

## 2016-04-27 DIAGNOSIS — M109 Gout, unspecified: Secondary | ICD-10-CM | POA: Diagnosis not present

## 2016-04-27 DIAGNOSIS — I129 Hypertensive chronic kidney disease with stage 1 through stage 4 chronic kidney disease, or unspecified chronic kidney disease: Secondary | ICD-10-CM | POA: Diagnosis not present

## 2016-04-27 DIAGNOSIS — N183 Chronic kidney disease, stage 3 (moderate): Secondary | ICD-10-CM | POA: Diagnosis not present

## 2016-04-27 DIAGNOSIS — I509 Heart failure, unspecified: Secondary | ICD-10-CM | POA: Diagnosis not present

## 2016-04-27 DIAGNOSIS — N2581 Secondary hyperparathyroidism of renal origin: Secondary | ICD-10-CM | POA: Diagnosis not present

## 2016-04-27 DIAGNOSIS — R809 Proteinuria, unspecified: Secondary | ICD-10-CM | POA: Diagnosis not present

## 2016-05-11 DIAGNOSIS — I10 Essential (primary) hypertension: Secondary | ICD-10-CM | POA: Diagnosis not present

## 2016-05-11 DIAGNOSIS — E1040 Type 1 diabetes mellitus with diabetic neuropathy, unspecified: Secondary | ICD-10-CM | POA: Diagnosis not present

## 2016-05-11 DIAGNOSIS — F419 Anxiety disorder, unspecified: Secondary | ICD-10-CM | POA: Diagnosis not present

## 2016-05-11 DIAGNOSIS — I509 Heart failure, unspecified: Secondary | ICD-10-CM | POA: Diagnosis not present

## 2016-05-17 DIAGNOSIS — H2513 Age-related nuclear cataract, bilateral: Secondary | ICD-10-CM | POA: Diagnosis not present

## 2016-05-17 DIAGNOSIS — H4010X1 Unspecified open-angle glaucoma, mild stage: Secondary | ICD-10-CM | POA: Diagnosis not present

## 2016-05-17 DIAGNOSIS — E113593 Type 2 diabetes mellitus with proliferative diabetic retinopathy without macular edema, bilateral: Secondary | ICD-10-CM | POA: Diagnosis not present

## 2016-05-17 DIAGNOSIS — E1139 Type 2 diabetes mellitus with other diabetic ophthalmic complication: Secondary | ICD-10-CM | POA: Diagnosis not present

## 2016-05-17 LAB — HM DIABETES EYE EXAM

## 2016-06-14 DIAGNOSIS — J449 Chronic obstructive pulmonary disease, unspecified: Secondary | ICD-10-CM | POA: Diagnosis not present

## 2016-06-14 DIAGNOSIS — Z6841 Body Mass Index (BMI) 40.0 and over, adult: Secondary | ICD-10-CM | POA: Diagnosis not present

## 2016-06-14 DIAGNOSIS — I509 Heart failure, unspecified: Secondary | ICD-10-CM | POA: Diagnosis not present

## 2016-06-14 DIAGNOSIS — Z1239 Encounter for other screening for malignant neoplasm of breast: Secondary | ICD-10-CM | POA: Diagnosis not present

## 2016-06-14 DIAGNOSIS — I10 Essential (primary) hypertension: Secondary | ICD-10-CM | POA: Diagnosis not present

## 2016-06-14 DIAGNOSIS — E1140 Type 2 diabetes mellitus with diabetic neuropathy, unspecified: Secondary | ICD-10-CM | POA: Diagnosis not present

## 2016-06-23 ENCOUNTER — Other Ambulatory Visit: Payer: Self-pay | Admitting: Internal Medicine

## 2016-06-23 DIAGNOSIS — Z1231 Encounter for screening mammogram for malignant neoplasm of breast: Secondary | ICD-10-CM

## 2016-06-30 ENCOUNTER — Ambulatory Visit
Admission: RE | Admit: 2016-06-30 | Discharge: 2016-06-30 | Disposition: A | Discharge status: Home / Self Care | Payer: Medicare Other | Source: Ambulatory Visit | Attending: Internal Medicine | Admitting: Internal Medicine

## 2016-06-30 ENCOUNTER — Other Ambulatory Visit: Payer: Self-pay | Admitting: Internal Medicine

## 2016-06-30 DIAGNOSIS — Z1231 Encounter for screening mammogram for malignant neoplasm of breast: Secondary | ICD-10-CM

## 2016-07-27 DIAGNOSIS — M4806 Spinal stenosis, lumbar region: Secondary | ICD-10-CM | POA: Diagnosis not present

## 2016-07-27 DIAGNOSIS — M5440 Lumbago with sciatica, unspecified side: Secondary | ICD-10-CM | POA: Diagnosis not present

## 2016-08-15 DIAGNOSIS — N183 Chronic kidney disease, stage 3 (moderate): Secondary | ICD-10-CM | POA: Diagnosis not present

## 2016-08-15 DIAGNOSIS — D509 Iron deficiency anemia, unspecified: Secondary | ICD-10-CM | POA: Diagnosis not present

## 2016-08-16 DIAGNOSIS — N184 Chronic kidney disease, stage 4 (severe): Secondary | ICD-10-CM | POA: Diagnosis not present

## 2016-08-16 DIAGNOSIS — I509 Heart failure, unspecified: Secondary | ICD-10-CM | POA: Diagnosis not present

## 2016-08-16 DIAGNOSIS — E1140 Type 2 diabetes mellitus with diabetic neuropathy, unspecified: Secondary | ICD-10-CM | POA: Diagnosis not present

## 2016-08-16 DIAGNOSIS — Z23 Encounter for immunization: Secondary | ICD-10-CM | POA: Diagnosis not present

## 2016-08-16 DIAGNOSIS — I10 Essential (primary) hypertension: Secondary | ICD-10-CM | POA: Diagnosis not present

## 2016-08-16 DIAGNOSIS — Z6841 Body Mass Index (BMI) 40.0 and over, adult: Secondary | ICD-10-CM | POA: Diagnosis not present

## 2016-08-17 DIAGNOSIS — D509 Iron deficiency anemia, unspecified: Secondary | ICD-10-CM | POA: Diagnosis not present

## 2016-08-17 DIAGNOSIS — R809 Proteinuria, unspecified: Secondary | ICD-10-CM | POA: Diagnosis not present

## 2016-08-17 DIAGNOSIS — E1129 Type 2 diabetes mellitus with other diabetic kidney complication: Secondary | ICD-10-CM | POA: Diagnosis not present

## 2016-08-17 DIAGNOSIS — M109 Gout, unspecified: Secondary | ICD-10-CM | POA: Diagnosis not present

## 2016-08-17 DIAGNOSIS — N2581 Secondary hyperparathyroidism of renal origin: Secondary | ICD-10-CM | POA: Diagnosis not present

## 2016-08-17 DIAGNOSIS — N183 Chronic kidney disease, stage 3 (moderate): Secondary | ICD-10-CM | POA: Diagnosis not present

## 2016-08-17 DIAGNOSIS — I509 Heart failure, unspecified: Secondary | ICD-10-CM | POA: Diagnosis not present

## 2016-08-17 DIAGNOSIS — I129 Hypertensive chronic kidney disease with stage 1 through stage 4 chronic kidney disease, or unspecified chronic kidney disease: Secondary | ICD-10-CM | POA: Diagnosis not present

## 2016-08-17 DIAGNOSIS — G4730 Sleep apnea, unspecified: Secondary | ICD-10-CM | POA: Diagnosis not present

## 2016-10-07 ENCOUNTER — Encounter: Payer: Self-pay | Admitting: Internal Medicine

## 2016-10-23 ENCOUNTER — Encounter (HOSPITAL_COMMUNITY): Payer: Self-pay | Admitting: Emergency Medicine

## 2016-10-23 ENCOUNTER — Inpatient Hospital Stay (HOSPITAL_COMMUNITY)
Admission: EM | Admit: 2016-10-23 | Discharge: 2016-10-24 | DRG: 291 | Disposition: A | Discharge status: Home / Self Care | Payer: Medicare Other | Attending: Family Medicine | Admitting: Family Medicine

## 2016-10-23 ENCOUNTER — Emergency Department (HOSPITAL_COMMUNITY): Payer: Medicare Other

## 2016-10-23 DIAGNOSIS — K219 Gastro-esophageal reflux disease without esophagitis: Secondary | ICD-10-CM | POA: Diagnosis present

## 2016-10-23 DIAGNOSIS — Z832 Family history of diseases of the blood and blood-forming organs and certain disorders involving the immune mechanism: Secondary | ICD-10-CM | POA: Diagnosis not present

## 2016-10-23 DIAGNOSIS — E662 Morbid (severe) obesity with alveolar hypoventilation: Secondary | ICD-10-CM | POA: Diagnosis present

## 2016-10-23 DIAGNOSIS — Z833 Family history of diabetes mellitus: Secondary | ICD-10-CM

## 2016-10-23 DIAGNOSIS — Z8249 Family history of ischemic heart disease and other diseases of the circulatory system: Secondary | ICD-10-CM | POA: Diagnosis not present

## 2016-10-23 DIAGNOSIS — J9620 Acute and chronic respiratory failure, unspecified whether with hypoxia or hypercapnia: Secondary | ICD-10-CM | POA: Diagnosis present

## 2016-10-23 DIAGNOSIS — I130 Hypertensive heart and chronic kidney disease with heart failure and stage 1 through stage 4 chronic kidney disease, or unspecified chronic kidney disease: Secondary | ICD-10-CM | POA: Diagnosis present

## 2016-10-23 DIAGNOSIS — E1121 Type 2 diabetes mellitus with diabetic nephropathy: Secondary | ICD-10-CM | POA: Diagnosis present

## 2016-10-23 DIAGNOSIS — E785 Hyperlipidemia, unspecified: Secondary | ICD-10-CM | POA: Diagnosis present

## 2016-10-23 DIAGNOSIS — J9621 Acute and chronic respiratory failure with hypoxia: Secondary | ICD-10-CM | POA: Diagnosis present

## 2016-10-23 DIAGNOSIS — G4733 Obstructive sleep apnea (adult) (pediatric): Secondary | ICD-10-CM | POA: Diagnosis present

## 2016-10-23 DIAGNOSIS — R0602 Shortness of breath: Secondary | ICD-10-CM | POA: Diagnosis present

## 2016-10-23 DIAGNOSIS — Z7982 Long term (current) use of aspirin: Secondary | ICD-10-CM

## 2016-10-23 DIAGNOSIS — E1122 Type 2 diabetes mellitus with diabetic chronic kidney disease: Secondary | ICD-10-CM | POA: Diagnosis present

## 2016-10-23 DIAGNOSIS — Z801 Family history of malignant neoplasm of trachea, bronchus and lung: Secondary | ICD-10-CM | POA: Diagnosis not present

## 2016-10-23 DIAGNOSIS — J449 Chronic obstructive pulmonary disease, unspecified: Secondary | ICD-10-CM | POA: Diagnosis present

## 2016-10-23 DIAGNOSIS — Z794 Long term (current) use of insulin: Secondary | ICD-10-CM | POA: Diagnosis not present

## 2016-10-23 DIAGNOSIS — I5033 Acute on chronic diastolic (congestive) heart failure: Secondary | ICD-10-CM | POA: Diagnosis present

## 2016-10-23 DIAGNOSIS — I509 Heart failure, unspecified: Secondary | ICD-10-CM | POA: Diagnosis not present

## 2016-10-23 DIAGNOSIS — R002 Palpitations: Secondary | ICD-10-CM | POA: Diagnosis not present

## 2016-10-23 DIAGNOSIS — Z9981 Dependence on supplemental oxygen: Secondary | ICD-10-CM | POA: Diagnosis not present

## 2016-10-23 DIAGNOSIS — N183 Chronic kidney disease, stage 3 (moderate): Secondary | ICD-10-CM | POA: Diagnosis present

## 2016-10-23 DIAGNOSIS — I110 Hypertensive heart disease with heart failure: Secondary | ICD-10-CM | POA: Diagnosis not present

## 2016-10-23 DIAGNOSIS — D649 Anemia, unspecified: Secondary | ICD-10-CM | POA: Diagnosis present

## 2016-10-23 DIAGNOSIS — Z6841 Body Mass Index (BMI) 40.0 and over, adult: Secondary | ICD-10-CM

## 2016-10-23 DIAGNOSIS — I10 Essential (primary) hypertension: Secondary | ICD-10-CM | POA: Diagnosis present

## 2016-10-23 DIAGNOSIS — E1151 Type 2 diabetes mellitus with diabetic peripheral angiopathy without gangrene: Secondary | ICD-10-CM | POA: Diagnosis present

## 2016-10-23 LAB — GLUCOSE, CAPILLARY
Glucose-Capillary: 124 mg/dL — ABNORMAL HIGH (ref 65–99)
Glucose-Capillary: 133 mg/dL — ABNORMAL HIGH (ref 65–99)

## 2016-10-23 LAB — CBC
HCT: 32.1 % — ABNORMAL LOW (ref 36.0–46.0)
HEMOGLOBIN: 9.2 g/dL — AB (ref 12.0–15.0)
MCH: 23.4 pg — AB (ref 26.0–34.0)
MCHC: 28.7 g/dL — ABNORMAL LOW (ref 30.0–36.0)
MCV: 81.5 fL (ref 78.0–100.0)
PLATELETS: 276 10*3/uL (ref 150–400)
RBC: 3.94 MIL/uL (ref 3.87–5.11)
RDW: 18 % — ABNORMAL HIGH (ref 11.5–15.5)
WBC: 8.3 10*3/uL (ref 4.0–10.5)

## 2016-10-23 LAB — BRAIN NATRIURETIC PEPTIDE: B NATRIURETIC PEPTIDE 5: 84.1 pg/mL (ref 0.0–100.0)

## 2016-10-23 LAB — BASIC METABOLIC PANEL
ANION GAP: 7 (ref 5–15)
BUN: 45 mg/dL — ABNORMAL HIGH (ref 6–20)
CALCIUM: 9.5 mg/dL (ref 8.9–10.3)
CO2: 28 mmol/L (ref 22–32)
CREATININE: 2.55 mg/dL — AB (ref 0.44–1.00)
Chloride: 104 mmol/L (ref 101–111)
GFR calc non Af Amer: 20 mL/min — ABNORMAL LOW (ref >60)
GFR, EST AFRICAN AMERICAN: 23 mL/min — AB (ref >60)
Glucose, Bld: 162 mg/dL — ABNORMAL HIGH (ref 65–99)
Potassium: 3.8 mmol/L (ref 3.5–5.1)
SODIUM: 139 mmol/L (ref 135–145)

## 2016-10-23 LAB — TROPONIN I: Troponin I: <0.03 ng/mL (ref <0.03)

## 2016-10-23 LAB — TSH: TSH: 3.661 u[IU]/mL (ref 0.350–4.500)

## 2016-10-23 LAB — MAGNESIUM: MAGNESIUM: 1.8 mg/dL (ref 1.7–2.4)

## 2016-10-23 LAB — I-STAT TROPONIN, ED: TROPONIN I, POC: 0 ng/mL (ref 0.00–0.08)

## 2016-10-23 MED ORDER — ACETAMINOPHEN 325 MG PO TABS: 650.0000 mg | ORAL_TABLET | ORAL | Status: DC | PRN

## 2016-10-23 MED ORDER — OMEGA-3-ACID ETHYL ESTERS 1 G PO CAPS
1.0000 g | ORAL_CAPSULE | Freq: Every day | ORAL | Status: DC
  Administered 2016-10-23 – 2016-10-24 (×2): 1 g via ORAL
  Filled 2016-10-23 (×2): qty 1

## 2016-10-23 MED ORDER — HEPARIN SODIUM (PORCINE) 5000 UNIT/ML IJ SOLN
5000.0000 [IU] | Freq: Three times a day (TID) | INTRAMUSCULAR | Status: DC
  Administered 2016-10-23 – 2016-10-24 (×3): 5000 [IU] via SUBCUTANEOUS
  Filled 2016-10-23 (×2): qty 1

## 2016-10-23 MED ORDER — INSULIN GLARGINE 100 UNIT/ML ~~LOC~~ SOLN
70.0000 [IU] | Freq: Every day | SUBCUTANEOUS | Status: DC
  Administered 2016-10-23: 70 [IU] via SUBCUTANEOUS
  Filled 2016-10-23 (×2): qty 0.7

## 2016-10-23 MED ORDER — IPRATROPIUM-ALBUTEROL 0.5-2.5 (3) MG/3ML IN SOLN
3.0000 mL | Freq: Once | RESPIRATORY_TRACT | Status: AC
  Administered 2016-10-23: 3 mL via RESPIRATORY_TRACT
  Filled 2016-10-23: qty 3

## 2016-10-23 MED ORDER — HYDRALAZINE HCL 25 MG PO TABS
25.0000 mg | ORAL_TABLET | Freq: Two times a day (BID) | ORAL | Status: DC
  Administered 2016-10-23 – 2016-10-24 (×2): 25 mg via ORAL
  Filled 2016-10-23 (×2): qty 1

## 2016-10-23 MED ORDER — ASPIRIN 81 MG PO CHEW
81.0000 mg | CHEWABLE_TABLET | Freq: Every day | ORAL | Status: DC
  Administered 2016-10-24: 81 mg via ORAL
  Filled 2016-10-23: qty 1

## 2016-10-23 MED ORDER — AMLODIPINE BESYLATE 5 MG PO TABS
10.0000 mg | ORAL_TABLET | Freq: Every day | ORAL | Status: DC
  Administered 2016-10-24: 10 mg via ORAL
  Filled 2016-10-23: qty 2

## 2016-10-23 MED ORDER — SODIUM CHLORIDE 0.9% FLUSH: 3.0000 mL | INTRAVENOUS | Status: DC | PRN

## 2016-10-23 MED ORDER — FUROSEMIDE 10 MG/ML IJ SOLN
60.0000 mg | Freq: Two times a day (BID) | INTRAMUSCULAR | Status: DC
  Administered 2016-10-24: 60 mg via INTRAVENOUS
  Filled 2016-10-23: qty 6

## 2016-10-23 MED ORDER — FUROSEMIDE 10 MG/ML IJ SOLN
60.0000 mg | Freq: Once | INTRAMUSCULAR | Status: AC
  Administered 2016-10-23: 60 mg via INTRAVENOUS
  Filled 2016-10-23: qty 6

## 2016-10-23 MED ORDER — ALBUTEROL SULFATE (2.5 MG/3ML) 0.083% IN NEBU: 2.5000 mg | INHALATION_SOLUTION | Freq: Four times a day (QID) | RESPIRATORY_TRACT | Status: DC | PRN

## 2016-10-23 MED ORDER — INSULIN ASPART 100 UNIT/ML ~~LOC~~ SOLN
0.0000 [IU] | Freq: Three times a day (TID) | SUBCUTANEOUS | Status: DC
  Administered 2016-10-23: 2 [IU] via SUBCUTANEOUS
  Administered 2016-10-24: 3 [IU] via SUBCUTANEOUS

## 2016-10-23 MED ORDER — ALBUTEROL SULFATE HFA 108 (90 BASE) MCG/ACT IN AERS: 1.0000 | INHALATION_SPRAY | Freq: Four times a day (QID) | RESPIRATORY_TRACT | Status: DC | PRN

## 2016-10-23 MED ORDER — ONDANSETRON HCL 4 MG/2ML IJ SOLN: 4.0000 mg | Freq: Four times a day (QID) | INTRAMUSCULAR | Status: DC | PRN

## 2016-10-23 MED ORDER — SODIUM CHLORIDE 0.9 % IV SOLN: 250.0000 mL | INTRAVENOUS | Status: DC | PRN

## 2016-10-23 MED ORDER — ROSUVASTATIN CALCIUM 10 MG PO TABS
40.0000 mg | ORAL_TABLET | Freq: Every evening | ORAL | Status: DC
  Administered 2016-10-23: 40 mg via ORAL
  Filled 2016-10-23: qty 4

## 2016-10-23 MED ORDER — METOPROLOL TARTRATE 25 MG PO TABS
50.0000 mg | ORAL_TABLET | Freq: Two times a day (BID) | ORAL | Status: DC
  Administered 2016-10-23 – 2016-10-24 (×2): 50 mg via ORAL
  Filled 2016-10-23 (×2): qty 2

## 2016-10-23 MED ORDER — PANTOPRAZOLE SODIUM 40 MG PO TBEC
40.0000 mg | DELAYED_RELEASE_TABLET | Freq: Every day | ORAL | Status: DC
  Administered 2016-10-24: 40 mg via ORAL
  Filled 2016-10-23: qty 1

## 2016-10-23 MED ORDER — SODIUM CHLORIDE 0.9% FLUSH
3.0000 mL | Freq: Two times a day (BID) | INTRAVENOUS | Status: DC
  Administered 2016-10-23 – 2016-10-24 (×2): 3 mL via INTRAVENOUS

## 2016-10-23 MED ORDER — CYCLOBENZAPRINE HCL 10 MG PO TABS: 10.0000 mg | ORAL_TABLET | Freq: Three times a day (TID) | ORAL | Status: DC | PRN

## 2016-10-23 NOTE — Progress Notes (Signed)
Refused bed alarm. Will continue to monitor patient. 

## 2016-10-23 NOTE — ED Notes (Signed)
ED Provider at bedside. 

## 2016-10-23 NOTE — H&P (Signed)
[facility] History and Physical  Catherine Williams [id number] DOB: [birth year] DOA: 10/23/2016  Referring physician: ED PCP: Philis Fendt, MD   Chief Complaint: worsening doe  HPI: Catherine Williams is a 55 y.o. female with history of obstructive sleep apnea on home CPAP, chronic kidney disease stage III, diabetes, chronic anemia, hypertension, chronic diastolic congestive heart failure, morbid obesity, chronic respiratory failure with hypoxia on home O2 of 2 L at nighttime, presented to the ED with worsening progressive dyspnea for the last 1 or 2 days. Of note, upon further questioning, she is complains of 4 pillow orthopnea (from 3 pillow orthopnea) for the last month, with intermittently increasing lower extremity edema bilaterally for the last 2 weeks. She does have a dietary indiscretions including eating out fast food, most recent was yesterday, ate at [place].  Also likes pickles and tomatoes. She tries to watch her salt intake but does admit that she has not been watching it lately.  Very compliant with all her medications though.  C/o of intermittent chest discomfort when she got up to go to bathroom in ED today, but denies cp currently.    Pt has a hx of copd, but recent PFT'S show restrictive pattern.  While in ED, on ambulation, became dizzy and desatted to mid-80s%.    Large family at beside.  Review of Systems:  Per HPI, o/w all systems reviewed and negative.  Past Medical History:  Diagnosis Date  . Anemia   . Arthritis Dec. 2014   Gout  . Asthma   . CHF (congestive heart failure) ([place])   . COPD (chronic obstructive pulmonary disease) ([place])   . Diabetes mellitus   . Heart failure   . Hyperlipidemia   . Hypertension   . Kidney disease   . Morbid obesity ([place])   . Peripheral vascular disease ([place])   . Pinched nerve   . Sleep apnea    Past Surgical History:  Procedure Laterality Date  . Atherectomy and angioplasty  10/18/2011   left posterior tibial  artery  . HAND SURGERY     right  . OVARY SURGERY     Left  . TOE AMPUTATION  Sept. 25,2012   Left 4th and 5th toes   Social History:  reports that she has never smoked. She has never used smokeless tobacco. She reports that she uses drugs, including Marijuana. She reports that she does not drink alcohol.  Allergies  Allergen Reactions  . Omnipaque [Iohexol] Nausea And Vomiting    Contrast Dye- Effects Kidney's    Family History  Problem Relation Age of Onset  . Lung cancer Mother   . Clotting disorder Mother   . Heart disease Mother   . Cancer Mother     Lung  . Deep vein thrombosis Mother   . Diabetes Mother   . Hypertension Mother   . Varicose Veins Mother   . Cancer Father   . Clotting disorder Sister   . Heart attack Sister   . Diabetes Sister   . Clotting disorder Brother   . Deep vein thrombosis Brother   . Diabetes Brother   . Heart disease Brother   . Heart disease Sister     Prior to Admission medications   Medication Sig Start Date End Date Taking? Authorizing Provider  albuterol (PROVENTIL HFA;VENTOLIN HFA) 108 (90 BASE) MCG/ACT inhaler Inhale 1-2 puffs into the lungs every 6 (six) hours as needed for wheezing. 07/25/13   Harden Mo, MD  amLODipine (  NORVASC) 10 MG tablet Take 10 mg by mouth daily.      Historical Provider, MD  aspirin 81 MG tablet Take 81 mg by mouth daily.      Historical Provider, MD  cyclobenzaprine (FLEXERIL) 10 MG tablet Take 10 mg by mouth 3 (three) times daily as needed for muscle spasms.    Historical Provider, MD  hydrALAZINE (APRESOLINE) 25 MG tablet Take 1 tablet (25 mg total) by mouth 2 (two) times daily. 07/21/15   Barton Dubois, MD  insulin aspart (NOVOLOG) 100 UNIT/ML injection Inject 20 Units into the skin 3 (three) times daily with meals.     Historical Provider, MD  insulin glargine (LANTUS) 100 UNIT/ML injection Inject 70 Units into the skin at bedtime.     Historical Provider, MD  metoprolol (LOPRESSOR) 50 MG tablet Take  50 mg by mouth 2 (two) times daily.      Historical Provider, MD  Omega-3 Fatty Acids (FISH OIL PO) Take 1 capsule by mouth daily.    Historical Provider, MD  pantoprazole (PROTONIX) 40 MG tablet Take 1 tablet (40 mg total) by mouth daily. 07/21/15   Barton Dubois, MD  rosuvastatin (CRESTOR) 40 MG tablet Take 40 mg by mouth every evening.     Historical Provider, MD  torsemide (DEMADEX) 20 MG tablet Take 2 tablets (40 mg total) by mouth 2 (two) times daily. 07/21/15   Barton Dubois, MD  VITAMIN E PO Take 1 capsule by mouth daily.    Historical Provider, MD   Physical Exam: Vitals:   10/23/16 1315 10/23/16 1345 10/23/16 1415 10/23/16 1445  BP: 172/78 170/77 181/60 186/90  Pulse: 66 64 70 67  Resp: 23 19 (!) 29 24  Temp:      TempSrc:      SpO2: 96% 95% 94% 93%  Weight:      Height:        Wt Readings from Last 3 Encounters:  10/23/16 (!) 153.8 kg (339 lb)  10/21/15 (!) 156 kg (344 lb)  10/12/15 (!) 156.5 kg (345 lb)    General:  Appears calm and comfortable, pleasant, NAD, AAOx3, morbid obese, speaking in full sentenses Eyes: PERRL, normal lids, irises & conjunctiva ENT: grossly normal hearing, lips & tongue, mmm Neck: no LAD, large neck circumference, difficult to access JVD. Cardiovascular: RRR, no m/r/g. Trace bilat LE edema. Telemetry: SR, no arrhythmias  Respiratory: CTA bilaterally, no w/r/r. Normal respiratory effort. Abdomen: soft, ntnd,obese. Skin: no rash or induration seen on limited exam Musculoskeletal: grossly normal tone BUE/BLE Psychiatric: grossly normal mood and affect, speech fluent and appropriate Neurologic: grossly non-focal.          Labs on Admission:  Basic Metabolic Panel:  Recent Labs Lab 10/23/16 1133  NA 139  K 3.8  CL 104  CO2 28  GLUCOSE 162*  BUN 45*  CREATININE 2.55*  CALCIUM 9.5   Liver Function Tests: No results for input(s): AST, ALT, ALKPHOS, BILITOT, PROT, ALBUMIN in the last 168 hours. No results for input(s): LIPASE, AMYLASE  in the last 168 hours. No results for input(s): AMMONIA in the last 168 hours. CBC:  Recent Labs Lab 10/23/16 1133  WBC 8.3  HGB 9.2*  HCT 32.1*  MCV 81.5  PLT 276   Cardiac Enzymes: No results for input(s): CKTOTAL, CKMB, CKMBINDEX, TROPONINI in the last 168 hours.  BNP (last 3 results)  Recent Labs  10/23/16 1211  BNP 84.1    ProBNP (last 3 results) No results for input(s): PROBNP  in the last 8760 hours.  CBG: No results for input(s): GLUCAP in the last 168 hours.  Radiological Exams on Admission: Dg Chest 2 View  Result Date: 10/23/2016 CLINICAL DATA:  Tachycardia EXAM: CHEST  2 VIEW COMPARISON:  07/19/2015 chest radiograph. FINDINGS: Stable cardiomediastinal silhouette with mild cardiomegaly. No pneumothorax. No pleural effusion. Mild pulmonary edema. No acute consolidative airspace disease. IMPRESSION: Mild congestive heart failure. Electronically Signed   By: Ilona Sorrel M.D.   On: 10/23/2016 12:26    EKG: Independently reviewed.   EKG Interpretation  Date/Time:  Sunday October 23 2016 11:20:55 EST Ventricular Rate:  66 PR Interval:  154 QRS Duration: 104 QT Interval:  430 QTC Calculation: 450 R Axis:   -62 Text Interpretation:  Normal sinus rhythm Left anterior fascicular block Possible Anterior infarct , age undetermined Abnormal ECG Confirmed by [facility] MD, JULIE ([id number]) on 10/23/2016 1:15:06 PM      Echo 7/16 Study Conclusions  - Left ventricle: Systolic function was normal. The estimated   ejection fraction was in the range of 60% to 65%. Doppler   parameters are consistent with abnormal left ventricular   relaxation (grade 1 diastolic dysfunction). Doppler parameters   are consistent with high ventricular filling pressure.  Impressions:  - Extremely limited; definity used; LV function appears to be   normal; focal wall motion abnormality cannot be excluded; grade 1   diastolic dysfunction with elevated LV filling pressure; doppler    suboptimal; no other useful information can be obtained with this   study.  11/16 pft  restrictive changes only     Assessment/Plan Principal Problem:   Acute on chronic diastolic CHF (congestive heart failure) (HCC) Active Problems:   OSA (obstructive sleep apnea)   Acute on chronic respiratory failure (HCC)   Chronic anemia   Hypertension   Type 2 diabetes with nephropathy (HCC)   Severe obesity (BMI >= 40) (HCC)   Shortness of breath   GERD (gastroesophageal reflux disease)   1. Dyspnea felt multifactorial, with Acute a chronic diastolic congestive heart failure, chronic respiratory failure, severe morbid obesity and obstructive sleep apnea - admit tele - aggressive diuresis with lasix 60iv q12 hr - strict I/o - daily weights - c/o of transient cp on exertion, no signs of acute ACS, will trend troponins - felt due to dietary indiscretions and salt-loading.  2. Acute on chronic diastolic congestive heart failure See  #1 Echo ordered, am ekg Asa 81  3. Type 2 diabetes with nephropathy - cardiac/carb controlled diet - home lantus reniewed, ssi - dietition c/s. For reeducation  4. Hypertension, uncontrolled - resumed all home meds, including norvasc 10, hydralazine 25bid, metoprolol 50bid  5. Sleep apnea on home CPAP - home cpap/o2 at night  6. Chronic respiratory failure with hypoxia on home O2 as well - see above, hx of dx of copd, but recent PFTs point only to restrictive pattern  7. Chronic anemia - stable compared to labs 07/19/15 in our system  8. gerd Continue ppi dw pt diet recds, avoiding tomatoes would help (she eats it a lot)  9. hld - continue statin  10 Severe obesity   No CS at this time, if decompensates, consider cards c/s  Code Status: full DVT Prophylaxis: hep [place] tid Family Communication: patient and family at bedside Disposition Plan: when clinically improved  Time spent: 38mins  Maren Reamer MD., MBA/MHA [facility] Pager [phone]

## 2016-10-23 NOTE — ED Triage Notes (Signed)
Pt. Stated, I started having rapid heart beat 2 days ago. And having headache and feeling weird.

## 2016-10-23 NOTE — ED Provider Notes (Signed)
[facility] DEPT Provider Note   CSN: [id number] Arrival date & time: 10/23/16  1116     History   Chief Complaint Chief Complaint  Patient presents with  . Palpitations  . Headache    HPI Catherine Williams is a 55 y.o. female.  HPI Catherine Williams is a 55 y.o. female with hx of anemia, asthma, CHF, COPD, HTN, presents to ED with complaint of palpitations, headache, shortness of breath. Pt states her headache is gradual in onset, comes and goes. Began 1 week ago. States she has been having palpitations over last several days, worse last night. States "my heart is rasing and beating so hard, feels like going to jump out of my chest." She also reports shortness of breath, worse when laying down flat. She is unable to sleep at night, states she couldn't sleep at all last night because of palpitations and shortness of breath. She states this morning she "had a funny feeling in my head." She states "something is just not right." She is unable to elaborate more. She denies any numbness or weakness in extremities. No difficulty speaking. She does have difficulty walking  Past Medical History:  Diagnosis Date  . Anemia   . Arthritis Dec. 2014   Gout  . Asthma   . CHF (congestive heart failure) ([place])   . COPD (chronic obstructive pulmonary disease) ([place])   . Diabetes mellitus   . Heart failure   . Hyperlipidemia   . Hypertension   . Kidney disease   . Morbid obesity ([place])   . Peripheral vascular disease ([place])   . Pinched nerve   . Sleep apnea     Patient Active Problem List   Diagnosis Date Noted  . Severe obesity (BMI >= 40) ([place]) 08/28/2015  . Dyspnea 08/27/2015  . Type 2 diabetes with nephropathy ([place])   . Acute respiratory failure with hypoxia ([place]) 07/17/2015  . CKD (chronic kidney disease) stage 3, GFR 30-59 ml/min 07/17/2015  . Diabetes mellitus type 2, controlled ([place]) 07/17/2015  . Chronic anemia 07/17/2015  . Hypertension 07/17/2015  . Acute on chronic diastolic  heart failure ([place])   . Aftercare following surgery of the circulatory system, [place] 12/30/2013  . Peripheral vascular disease, unspecified 05/07/2012  . Visit for wound check 12/19/2011  . OSA (obstructive sleep apnea) 04/05/2011    Past Surgical History:  Procedure Laterality Date  . Atherectomy and angioplasty  10/18/2011   left posterior tibial artery  . HAND SURGERY     right  . OVARY SURGERY     Left  . 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 Authorizing Provider  albuterol (PROVENTIL HFA;VENTOLIN HFA) 108 (90 BASE) MCG/ACT inhaler Inhale 1-2 puffs into the lungs every 6 (six) hours as needed for wheezing. 07/25/13   Harden Mo, MD  amLODipine (NORVASC) 10 MG tablet Take 10 mg by mouth daily.      Historical Provider, MD  aspirin 81 MG tablet Take 81 mg by mouth daily.      Historical Provider, MD  cyclobenzaprine (FLEXERIL) 10 MG tablet Take 10 mg by mouth 3 (three) times daily as needed for muscle spasms.    Historical Provider, MD  hydrALAZINE (APRESOLINE) 25 MG tablet Take 1 tablet (25 mg total) by  mouth 2 (two) times daily. 07/21/15   Barton Dubois, MD  insulin aspart (NOVOLOG) 100 UNIT/ML injection Inject 20 Units into the skin 3 (three) times daily with meals.     Historical Provider, MD  insulin glargine (LANTUS) 100 UNIT/ML injection Inject 70 Units into the skin at bedtime.     Historical Provider, MD  metoprolol (LOPRESSOR) 50 MG tablet Take 50 mg by mouth 2 (two) times daily.      Historical Provider, MD  Omega-3 Fatty Acids (FISH OIL PO) Take 1 capsule by mouth daily.    Historical Provider, MD  pantoprazole (PROTONIX) 40 MG tablet Take 1 tablet (40 mg total) by mouth daily. 07/21/15   Barton Dubois, MD  rosuvastatin (CRESTOR) 40 MG tablet Take 40 mg by mouth every evening.     Historical Provider, MD  torsemide  (DEMADEX) 20 MG tablet Take 2 tablets (40 mg total) by mouth 2 (two) times daily. 07/21/15   Barton Dubois, MD  VITAMIN E PO Take 1 capsule by mouth daily.    Historical Provider, MD    Family History Family History  Problem Relation Age of Onset  . Lung cancer Mother   . Clotting disorder Mother   . Heart disease Mother   . Cancer Mother     Lung  . Deep vein thrombosis Mother   . Diabetes Mother   . Hypertension Mother   . Varicose Veins Mother   . Cancer Father   . Clotting disorder Sister   . Heart attack Sister   . Diabetes Sister   . Clotting disorder Brother   . Deep vein thrombosis Brother   . Diabetes Brother   . Heart disease Brother   . Heart disease Sister     Social History Social History  Substance Use Topics  . Smoking status: Never Smoker  . Smokeless tobacco: Never Used  . Alcohol use No     Allergies   Omnipaque [iohexol]   Review of Systems Review of Systems  Constitutional: Negative for chills and fever.  Respiratory: Positive for chest tightness and shortness of breath. Negative for cough.   Cardiovascular: Positive for chest pain, palpitations and leg swelling.  Gastrointestinal: Negative for abdominal pain, diarrhea, nausea and vomiting.  Genitourinary: Negative for dysuria, flank pain and pelvic pain.  Musculoskeletal: Negative for arthralgias, myalgias, neck pain and neck stiffness.  Skin: Negative for rash.  Neurological: Positive for dizziness and light-headedness. Negative for weakness and headaches.  All other systems reviewed and are negative.    Physical Exam Updated Vital Signs BP 162/86 (BP Location: Left Arm) Comment: Simultaneous filing. User may not have seen previous data.  Pulse 68 Comment: Simultaneous filing. User may not have seen previous data.  Temp 98 F (36.7 C) (Oral)   Resp 19 Comment: Simultaneous filing. User may not have seen previous data.  Ht 5\' 3"  (1.6 m)   Wt (!) 153.8 kg   SpO2 99% Comment:  Simultaneous filing. User may not have seen previous data.  BMI 60.05 kg/m   Physical Exam  Constitutional: She appears well-developed and well-nourished. No distress.  HENT:  Head: Normocephalic.  Eyes: Conjunctivae are normal.  Neck: Neck supple.  Cardiovascular: Normal rate, regular rhythm and normal heart sounds.   Pulmonary/Chest: Effort normal. No respiratory distress. She has no wheezes. She has rales.  Abdominal: Soft. Bowel sounds are normal. She exhibits no distension. There is no tenderness. There is no rebound.  Musculoskeletal: She exhibits edema.  Neurological: She is alert.  Skin: Skin is warm and dry.  Psychiatric: She has a normal mood and affect. Her behavior is normal.  Nursing note and vitals reviewed.    ED Treatments / Results  Labs (all labs ordered are listed, but only abnormal results are displayed) Labs Reviewed  BASIC METABOLIC PANEL - Abnormal; Notable for the following:       Result Value   Glucose, Bld 162 (*)    BUN 45 (*)    Creatinine, Ser 2.55 (*)    GFR calc non Af Amer 20 (*)    GFR calc Af Amer 23 (*)    All other components within normal limits  CBC - Abnormal; Notable for the following:    Hemoglobin 9.2 (*)    HCT 32.1 (*)    MCH 23.4 (*)    MCHC 28.7 (*)    RDW 18.0 (*)    All other components within normal limits  BRAIN NATRIURETIC PEPTIDE  TSH  MAGNESIUM  TROPONIN I  HEMOGLOBIN A1C  TROPONIN I  TROPONIN I  I-STAT TROPOININ, ED    EKG  EKG Interpretation  Date/Time:  Sunday October 23 2016 11:20:55 EST Ventricular Rate:  66 PR Interval:  154 QRS Duration: 104 QT Interval:  430 QTC Calculation: 450 R Axis:   -62 Text Interpretation:  Normal sinus rhythm Left anterior fascicular block Possible Anterior infarct , age undetermined Abnormal ECG Confirmed by [facility] MD, JULIE ([id number]) on 10/23/2016 1:15:06 PM       Radiology Dg Chest 2 View  Result Date: 10/23/2016 CLINICAL DATA:  Tachycardia EXAM: CHEST  2 VIEW  COMPARISON:  07/19/2015 chest radiograph. FINDINGS: Stable cardiomediastinal silhouette with mild cardiomegaly. No pneumothorax. No pleural effusion. Mild pulmonary edema. No acute consolidative airspace disease. IMPRESSION: Mild congestive heart failure. Electronically Signed   By: Ilona Sorrel M.D.   On: 10/23/2016 12:26    Procedures Procedures (including critical care time)  Medications Ordered in ED Medications  omega-3 acid ethyl esters (LOVAZA) capsule 1 g (not administered)  hydrALAZINE (APRESOLINE) tablet 25 mg (not administered)  pantoprazole (PROTONIX) EC tablet 40 mg (not administered)  cyclobenzaprine (FLEXERIL) tablet 10 mg (not administered)  rosuvastatin (CRESTOR) tablet 40 mg (not administered)  amLODipine (NORVASC) tablet 10 mg (not administered)  aspirin chewable tablet 81 mg (not administered)  insulin glargine (LANTUS) injection 70 Units (not administered)  metoprolol tartrate (LOPRESSOR) tablet 50 mg (not administered)  sodium chloride flush (NS) 0.9 % injection 3 mL (not administered)  sodium chloride flush (NS) 0.9 % injection 3 mL (not administered)  0.9 %  sodium chloride infusion (not administered)  acetaminophen (TYLENOL) tablet 650 mg (not administered)  ondansetron (ZOFRAN) injection 4 mg (not administered)  heparin injection 5,000 Units (not administered)  furosemide (LASIX) injection 60 mg (60 mg Intravenous Not Given 10/23/16 1614)  insulin aspart (novoLOG) injection 0-15 Units (not administered)  albuterol (PROVENTIL) (2.5 MG/3ML) 0.083% nebulizer solution 2.5 mg (not administered)  furosemide (LASIX) injection 60 mg (60 mg Intravenous Given 10/23/16 1315)  ipratropium-albuterol (DUONEB) 0.5-2.5 (3) MG/3ML nebulizer solution 3 mL (3 mLs Nebulization Given 10/23/16 1451)     Initial Impression / Assessment and Plan / ED Course  I have reviewed the triage vital signs and the nursing notes.  Pertinent labs & imaging results that were available during my  care of the patient were reviewed by me and considered in my medical decision making (see chart for details).  Clinical Course    Pt in ED with progressive SOB, orthopnea, unable  to walk 84ft. Will get labs, CXR, monitor.   Pt with mild CHF on xray, lasix 60mg  ordered. Labs with no otherwise acute findings. NSR on ECG. Pt has not had any episodes of palpitations here. Will ambulate with pulse ox  Pt ambulated with pulse ox. Got very dizzy after just taking 10 steps, desatted into high 80s, became very tachypnec and sob. Will admit. Duo neb ordered.   Spoke with [facility], will admit due to hypoxia.   Vitals:   10/23/16 1345 10/23/16 1415 10/23/16 1445 10/23/16 1500  BP: 170/77 181/60 186/90 (!) 179/57  Pulse: 64 70 67 77  Resp: 19 (!) 29 24 (!) 22  Temp:    97.8 F (36.6 C)  TempSrc:    Oral  SpO2: 95% 94% 93% 96%  Weight:    (!) 153.9 kg  Height:    5\' 3"  (1.6 m)     Final Clinical Impressions(s) / ED Diagnoses   Final diagnoses:  Shortness of breath  Congestive heart failure, unspecified congestive heart failure chronicity, unspecified congestive heart failure type [facility])  Palpitations    New Prescriptions Current Discharge Medication List       Jeannett Senior, PA-C 10/23/16 1619    Isla Pence, MD 10/24/16 [phone]

## 2016-10-23 NOTE — ED Notes (Signed)
Pt oob to br on pulse ox. Pt becomes dizzy and shob on return to bed. Sat noted as 87% on RA.

## 2016-10-24 ENCOUNTER — Other Ambulatory Visit (HOSPITAL_COMMUNITY): Payer: Medicare Other

## 2016-10-24 LAB — TROPONIN I

## 2016-10-24 LAB — BASIC METABOLIC PANEL
ANION GAP: 11 (ref 5–15)
BUN: 44 mg/dL — ABNORMAL HIGH (ref 6–20)
CALCIUM: 9.6 mg/dL (ref 8.9–10.3)
CO2: 29 mmol/L (ref 22–32)
CREATININE: 2.55 mg/dL — AB (ref 0.44–1.00)
Chloride: 104 mmol/L (ref 101–111)
GFR, EST AFRICAN AMERICAN: 23 mL/min — AB (ref >60)
GFR, EST NON AFRICAN AMERICAN: 20 mL/min — AB (ref >60)
Glucose, Bld: 149 mg/dL — ABNORMAL HIGH (ref 65–99)
Potassium: 3.8 mmol/L (ref 3.5–5.1)
SODIUM: 144 mmol/L (ref 135–145)

## 2016-10-24 LAB — HEMOGLOBIN A1C
HEMOGLOBIN A1C: 8 % — AB (ref 4.8–5.6)
MEAN PLASMA GLUCOSE: 183 mg/dL

## 2016-10-24 LAB — GLUCOSE, CAPILLARY
Glucose-Capillary: 115 mg/dL — ABNORMAL HIGH (ref 65–99)
Glucose-Capillary: 158 mg/dL — ABNORMAL HIGH (ref 65–99)

## 2016-10-24 NOTE — Discharge Summary (Signed)
Physician Discharge Summary  KATRICIA PREHN WJX:[id number] DOB: 15-Jan-1961 DOA: 10/23/2016  PCP: Philis Fendt, MD  Admit date: 10/23/2016 Discharge date: 10/24/2016  Admitted From: Home Disposition: Home   Recommendations for Outpatient Follow-up:  1. Follow up with cardiology within next 7 days 2. Check BMP to monitor electrolytes and renal function. 3. Continue to reinforce low salt diet and consider bariatric surgery. 4. Consider repeat echocardiogram, not performed during hospital observation.  Home Health: None Equipment/Devices: None Discharge Condition: Stable CODE STATUS: Full Diet recommendation: Heart healthy, low sodium, low carbs  Brief/Interim Summary: Catherine Williams is a 55 y.o. female with a history of morbid obesity (BMI 60), chronic respiratory failure on nocturnal home oxygen and CPAP, CKD III, T2DM, and chronic diastolic CHF who presented on 11/5 with progressive dyspnea, LE edema, and orthopnea for the past week. She admitted significant dietary indiscretions regarding increasing fast food and high salt foods thought to contribute to these findings. She also had intermittent chest discomfort. Troponins were negative, ECG was nonischemic, and suspicion for ACS remains low. She required 2L oxygen in the ED and was given IV lasix admitted for ongoing diuresis. 3L urine output was achieved and she reported tremendous subject improvement in respiratory status. She wishes to be discharged and has made significantly greater improvement in respiratory status after IV diuresis than would be expected from presentation. Follow up has been scheduled and she confirms understanding of an increased dose of torsemide (60mg  qAM and 40mg  qPM) for the next 5 days until follow up with her cardiologist, Dr. Einar Gip. Discharge weight 152.6kg, no oxygen requirement. A nutrition consultant reviewed low salt and carbohydrate diet with the patient prior to discharge.   Dyspnea is felt to be  multifactorial with element of volume overload superimposed on morbid obesity, OHS, OSA, restrictive lung disease as noted on most recent PFT's (Pulmonologist is Dr. Melvyn Novas), and chronic anemia. All home meds, including norvasc 10mg , hydralazine 25mg  BID, and metoprolol 50mg  BID  ECHOCARDIOGRAM July 2016:  - Left ventricle: Systolic function was normal. The estimated ejection fraction was in the range of 60% to 65%. Dopplerparameters are consistent with abnormal left ventricular relaxation (grade 1 diastolic dysfunction). Doppler parametersare consistent with high ventricular filling pressure.  Impressions: - Extremely limited; definity used; LV function appears to be normal; focal wall motion abnormality cannot be excluded; grade 1diastolic dysfunction with elevated LV filling pressure; dopplersuboptimal; no other useful information can be obtained with this study.  Discharge Diagnoses:  Principal Problem:   Acute on chronic diastolic CHF (congestive heart failure) (HCC) Active Problems:   OSA (obstructive sleep apnea)   Acute on chronic respiratory failure (HCC)   Chronic anemia   Hypertension   Type 2 diabetes with nephropathy (HCC)   Severe obesity (BMI >= 40) (HCC)   Shortness of breath   GERD (gastroesophageal reflux disease)  Discharge Instructions Discharge Instructions    (HEART FAILURE PATIENTS) Call MD:  Anytime you have any of the following symptoms: 1) 3 pound weight gain in 24 hours or 5 pounds in 1 week 2) shortness of breath, with or without a dry hacking cough 3) swelling in the hands, feet or stomach 4) if you have to sleep on extra pillows at night in order to breathe.    Complete by:  As directed    Diet - low sodium heart healthy    Complete by:  As directed    Discharge instructions    Complete by:  As directed  You were admitted for shortness of breath related to volume overload. This probably occurred because of increased salt intake, so salt restriction  is recommended. You are stable for discharge today with the following recommendations:  - Avoid salt - Take an extra tablet of torsemide in the morning dose until you follow up with Dr. Einar Gip. This means taking torsemide 60mg  in the morning and 40mg  in the afternoon.  - You have follow up scheduled with Dr. Einar Gip later this week. It is important to make that appointment.  - If your symptoms worsen or return please seek medical attention.   Increase activity slowly    Complete by:  As directed        Medication List    TAKE these medications   albuterol 108 (90 Base) MCG/ACT inhaler Commonly known as:  PROVENTIL HFA;VENTOLIN HFA Inhale 1-2 puffs into the lungs every 6 (six) hours as needed for wheezing.   amLODipine 10 MG tablet Commonly known as:  NORVASC Take 10 mg by mouth daily.   aspirin 81 MG tablet Take 81 mg by mouth daily.   cyclobenzaprine 10 MG tablet Commonly known as:  FLEXERIL Take 10 mg by mouth 3 (three) times daily as needed for muscle spasms.   FISH OIL PO Take 1 capsule by mouth daily.   hydrALAZINE 25 MG tablet Commonly known as:  APRESOLINE Take 1 tablet (25 mg total) by mouth 2 (two) times daily.   insulin aspart 100 UNIT/ML injection Commonly known as:  novoLOG Inject 20 Units into the skin 3 (three) times daily with meals.   insulin glargine 100 UNIT/ML injection Commonly known as:  LANTUS Inject 70 Units into the skin at bedtime.   metoprolol 50 MG tablet Commonly known as:  LOPRESSOR Take 50 mg by mouth 2 (two) times daily.   pantoprazole 40 MG tablet Commonly known as:  PROTONIX Take 1 tablet (40 mg total) by mouth daily.   rosuvastatin 40 MG tablet Commonly known as:  CRESTOR Take 40 mg by mouth every evening.   torsemide 20 MG tablet Commonly known as:  DEMADEX Take 2 tablets (40 mg total) by mouth 2 (two) times daily.   VITAMIN E PO Take 1 capsule by mouth daily.      Follow-up Information    Adrian Prows, MD. Go on  10/29/2016.   Specialty:  Cardiology Why:        @11 :[place] information: [address] [place] [address] [phone]        Philis Fendt, MD. Go on 11/02/2016.   Specialty:  Internal Medicine Why:  @3 :30pm Contact information: [place] [address] [phone]          Allergies  Allergen Reactions  . Omnipaque [Iohexol] Nausea And Vomiting    Contrast Dye- Effects Kidney's    Consultations:  None  Procedures/Studies: Dg Chest 2 View  Result Date: 10/23/2016 CLINICAL DATA:  Tachycardia EXAM: CHEST  2 VIEW COMPARISON:  07/19/2015 chest radiograph. FINDINGS: Stable cardiomediastinal silhouette with mild cardiomegaly. No pneumothorax. No pleural effusion. Mild pulmonary edema. No acute consolidative airspace disease. IMPRESSION: Mild congestive heart failure. Electronically Signed   By: Ilona Sorrel M.D.   On: 10/23/2016 12:26     Subjective: Patient slept well with CPAP, denies any shortness of breath, reports she feels much better. No chest pain. Endorses eating a lot of salt recently and that torsemide was not making her urinate as much as it previously had.  Discharge  Exam: Vitals:   10/24/16 0605 10/24/16 0943  BP: (!) 143/45 (!) 151/63  Pulse: 76 78  Resp: 18   Temp: 97 F (36.1 C)    Vitals:   10/23/16 2305 10/23/16 2355 10/24/16 0605 10/24/16 0943  BP:  (!) 124/34 (!) 143/45 (!) 151/63  Pulse:  65 76 78  Resp: 16 20 18    Temp:  98.6 F (37 C) 97 F (36.1 C)   TempSrc:  Axillary Oral   SpO2: 96% 98% 96%   Weight:   (!) 152.6 kg (336 lb 6.4 oz)   Height:       General: Pt is alert, awake, not in acute distress Neck: Large circumference and + acanthosis Cardiovascular: RRR, S1/S2 +, no rubs, no gallops Respiratory: CTA bilaterally, no wheezing, no rhonchi Abdominal: Soft, NT, ND, bowel sounds + Extremities: obese with trace edema, no cyanosis  The results of significant diagnostics from this  hospitalization (including imaging, microbiology, ancillary and laboratory) are listed below for reference.    Labs: BNP (last 3 results)  Recent Labs  10/23/16 1211  BNP 01.6   Basic Metabolic Panel:  Recent Labs Lab 10/23/16 1133 10/23/16 1454 10/24/16 0348  NA 139  --  144  K 3.8  --  3.8  CL 104  --  104  CO2 28  --  29  GLUCOSE 162*  --  149*  BUN 45*  --  44*  CREATININE 2.55*  --  2.55*  CALCIUM 9.5  --  9.6  MG  --  1.8  --    CBC:  Recent Labs Lab 10/23/16 1133  WBC 8.3  HGB 9.2*  HCT 32.1*  MCV 81.5  PLT 276   Cardiac Enzymes:  Recent Labs Lab 10/23/16 1454 10/23/16 2025 10/24/16 0348  TROPONINI <0.03 <0.03 <0.03   CBG:  Recent Labs Lab 10/23/16 1636 10/23/16 2130 10/24/16 0701  GLUCAP 124* 133* 115*   Hgb A1c  Recent Labs  10/23/16 1454  HGBA1C 8.0*   Thyroid function studies  Recent Labs  10/23/16 1454  TSH 3.661   Urinalysis    Component Value Date/Time   COLORURINE YELLOW 07/19/2015 1939   APPEARANCEUR CLEAR 07/19/2015 1939   LABSPEC 1.010 07/19/2015 1939   PHURINE 5.0 07/19/2015 1939   GLUCOSEU NEGATIVE 07/19/2015 1939   HGBUR NEGATIVE 07/19/2015 1939   BILIRUBINUR NEGATIVE 07/19/2015 1939   KETONESUR NEGATIVE 07/19/2015 1939   PROTEINUR NEGATIVE 07/19/2015 1939   UROBILINOGEN 0.2 07/19/2015 1939   NITRITE NEGATIVE 07/19/2015 1939   LEUKOCYTESUR NEGATIVE 07/19/2015 1939    Time coordinating discharge: Over 48 minutes  Vance Gather, MD  [facility] 10/24/2016, 10:47 AM Pager [phone]  If 7PM-7AM, please contact night-coverage [email or web address] Password [email or web address]

## 2016-10-24 NOTE — Progress Notes (Signed)
Nutrition Education Note  RD consulted for nutrition education regarding new onset CHF. Pt states that this is not a new diagnosis and she reports following a low sodium diet. She admits that she has been eating fried chicken and fish and french fries recently. She reports that she lost 30 lbs in the past few months and felt that she could eat whatever she wanted which she realizes is not the case. She also admits to drinking a lot of fluid.  RD provided "Low Sodium Nutrition Therapy" handout from the Academy of [employer] and Dietetics. Encouraged fresh fruits and vegetables as well as whole grain sources of carbohydrates to maximize fiber intake. Pt states that she eats a lot of fruits and vegetables. She states that she doesn't eat any sweets.   RD reviewed why it is important for patient to adhere to diet recommendations, and emphasized the role of fluids, foods to avoid, and importance of weighing self daily. Teach back method used. Pt states that she needs to decrease her portion sizes.   Expect good compliance.  Body mass index is 59.59 kg/m. Pt meets criteria for Morbid Obesity based on current BMI.  Current diet order is Low Sodium/Heart healthy, patient is consuming approximately 100% of meals at this time. Labs and medications reviewed. No further nutrition interventions warranted at this time. RD contact information provided. If additional nutrition issues arise, please re-consult RD.   Scarlette Ar RD, CSP, LDN Inpatient Clinical Dietitian Pager: [phone] After Hours Pager: [phone]

## 2016-10-24 NOTE — Care Management Note (Signed)
Case Management Note  Patient Details  Name: Catherine Williams MRN: [id number] Date of Birth: September 22, 1961  Subjective/Objective:   Presents with acute/chronic chf, osa, pta indep, uses cpap at night at home , diuresing, for dc today, no needs.                 Action/Plan:   Expected Discharge Date:                  Expected Discharge Plan:  Home/Self Care  In-House Referral:     Discharge planning Services  CM Consult  Post Acute Care Choice:    Choice offered to:     DME Arranged:    DME Agency:     HH Arranged:    HH Agency:     Status of Service:  Completed, signed off  If discussed at [employer] of Stay Meetings, dates discussed:    Additional Comments:  Zenon Mayo, RN 10/24/2016, 3:12 PM

## 2016-10-24 NOTE — Progress Notes (Signed)
Pt has orders to be discharged. Discharge instructions given and pt has no additional questions at this time. Medication regimen reviewed and pt educated. Pt verbalized understanding and has no additional questions. Telemetry box removed. IV removed and site in good condition. Pt stable and waiting for transportation.   Jewel Venditto RN 

## 2016-10-24 NOTE — Consult Note (Signed)
   [facility] CM Inpatient Consult   10/24/2016  Catherine Williams [birth year] [id number]   Patient screened for potential [place] Management services for HF exacerbation.. Patient is NOT" eligible for [facility] Care Management services.   Met with the patient and she confirms that her primary care provider is Dr. Nolene Ebbs and he is no longer a [facility] Care Management provider for services.  Patient denies any needs for post hospital follow up and she states, "I have an appointment with Dr. Jeanie Cooks this Friday coming up.     Natividad Brood, RN BSN [facility] Liaison  [phone] business mobile phone Toll free office [phone]

## 2016-10-31 ENCOUNTER — Encounter (HOSPITAL_COMMUNITY): Payer: Self-pay

## 2016-10-31 ENCOUNTER — Inpatient Hospital Stay (HOSPITAL_COMMUNITY)
Admission: EM | Admit: 2016-10-31 | Discharge: 2016-11-03 | DRG: 291 | Disposition: A | Discharge status: Home / Self Care | Payer: Medicare Other | Attending: Family Medicine | Admitting: Family Medicine

## 2016-10-31 ENCOUNTER — Emergency Department (HOSPITAL_COMMUNITY): Payer: Medicare Other

## 2016-10-31 DIAGNOSIS — E118 Type 2 diabetes mellitus with unspecified complications: Secondary | ICD-10-CM | POA: Diagnosis not present

## 2016-10-31 DIAGNOSIS — J9621 Acute and chronic respiratory failure with hypoxia: Secondary | ICD-10-CM | POA: Diagnosis present

## 2016-10-31 DIAGNOSIS — E1151 Type 2 diabetes mellitus with diabetic peripheral angiopathy without gangrene: Secondary | ICD-10-CM | POA: Diagnosis present

## 2016-10-31 DIAGNOSIS — I5033 Acute on chronic diastolic (congestive) heart failure: Secondary | ICD-10-CM | POA: Diagnosis not present

## 2016-10-31 DIAGNOSIS — J9690 Respiratory failure, unspecified, unspecified whether with hypoxia or hypercapnia: Secondary | ICD-10-CM

## 2016-10-31 DIAGNOSIS — Z833 Family history of diabetes mellitus: Secondary | ICD-10-CM

## 2016-10-31 DIAGNOSIS — D649 Anemia, unspecified: Secondary | ICD-10-CM | POA: Diagnosis not present

## 2016-10-31 DIAGNOSIS — J449 Chronic obstructive pulmonary disease, unspecified: Secondary | ICD-10-CM | POA: Diagnosis present

## 2016-10-31 DIAGNOSIS — E119 Type 2 diabetes mellitus without complications: Secondary | ICD-10-CM | POA: Diagnosis not present

## 2016-10-31 DIAGNOSIS — E662 Morbid (severe) obesity with alveolar hypoventilation: Secondary | ICD-10-CM | POA: Diagnosis not present

## 2016-10-31 DIAGNOSIS — E1165 Type 2 diabetes mellitus with hyperglycemia: Secondary | ICD-10-CM

## 2016-10-31 DIAGNOSIS — Z9119 Patient's noncompliance with other medical treatment and regimen: Secondary | ICD-10-CM

## 2016-10-31 DIAGNOSIS — M109 Gout, unspecified: Secondary | ICD-10-CM | POA: Diagnosis present

## 2016-10-31 DIAGNOSIS — I10 Essential (primary) hypertension: Secondary | ICD-10-CM | POA: Diagnosis present

## 2016-10-31 DIAGNOSIS — N179 Acute kidney failure, unspecified: Secondary | ICD-10-CM | POA: Diagnosis not present

## 2016-10-31 DIAGNOSIS — I130 Hypertensive heart and chronic kidney disease with heart failure and stage 1 through stage 4 chronic kidney disease, or unspecified chronic kidney disease: Secondary | ICD-10-CM | POA: Diagnosis not present

## 2016-10-31 DIAGNOSIS — Z832 Family history of diseases of the blood and blood-forming organs and certain disorders involving the immune mechanism: Secondary | ICD-10-CM

## 2016-10-31 DIAGNOSIS — G629 Polyneuropathy, unspecified: Secondary | ICD-10-CM

## 2016-10-31 DIAGNOSIS — Z6841 Body Mass Index (BMI) 40.0 and over, adult: Secondary | ICD-10-CM

## 2016-10-31 DIAGNOSIS — N184 Chronic kidney disease, stage 4 (severe): Secondary | ICD-10-CM | POA: Diagnosis not present

## 2016-10-31 DIAGNOSIS — Z8249 Family history of ischemic heart disease and other diseases of the circulatory system: Secondary | ICD-10-CM

## 2016-10-31 DIAGNOSIS — G4733 Obstructive sleep apnea (adult) (pediatric): Secondary | ICD-10-CM | POA: Diagnosis present

## 2016-10-31 DIAGNOSIS — Z794 Long term (current) use of insulin: Secondary | ICD-10-CM

## 2016-10-31 DIAGNOSIS — E1122 Type 2 diabetes mellitus with diabetic chronic kidney disease: Secondary | ICD-10-CM | POA: Diagnosis present

## 2016-10-31 DIAGNOSIS — R079 Chest pain, unspecified: Secondary | ICD-10-CM | POA: Diagnosis not present

## 2016-10-31 DIAGNOSIS — Z801 Family history of malignant neoplasm of trachea, bronchus and lung: Secondary | ICD-10-CM

## 2016-10-31 DIAGNOSIS — J9620 Acute and chronic respiratory failure, unspecified whether with hypoxia or hypercapnia: Secondary | ICD-10-CM | POA: Diagnosis present

## 2016-10-31 DIAGNOSIS — IMO0001 Reserved for inherently not codable concepts without codable children: Secondary | ICD-10-CM | POA: Insufficient documentation

## 2016-10-31 DIAGNOSIS — D631 Anemia in chronic kidney disease: Secondary | ICD-10-CM | POA: Diagnosis present

## 2016-10-31 DIAGNOSIS — I509 Heart failure, unspecified: Secondary | ICD-10-CM

## 2016-10-31 DIAGNOSIS — E1141 Type 2 diabetes mellitus with diabetic mononeuropathy: Secondary | ICD-10-CM | POA: Diagnosis present

## 2016-10-31 DIAGNOSIS — Z7982 Long term (current) use of aspirin: Secondary | ICD-10-CM

## 2016-10-31 DIAGNOSIS — E785 Hyperlipidemia, unspecified: Secondary | ICD-10-CM | POA: Diagnosis present

## 2016-10-31 DIAGNOSIS — Z91041 Radiographic dye allergy status: Secondary | ICD-10-CM

## 2016-10-31 LAB — URINE MICROSCOPIC-ADD ON: RBC / HPF: NONE SEEN RBC/hpf (ref 0–5)

## 2016-10-31 LAB — CBC
HCT: 29.1 % — ABNORMAL LOW (ref 36.0–46.0)
Hemoglobin: 8.4 g/dL — ABNORMAL LOW (ref 12.0–15.0)
MCH: 23.5 pg — AB (ref 26.0–34.0)
MCHC: 28.9 g/dL — AB (ref 30.0–36.0)
MCV: 81.3 fL (ref 78.0–100.0)
Platelets: 274 10*3/uL (ref 150–400)
RBC: 3.58 MIL/uL — ABNORMAL LOW (ref 3.87–5.11)
RDW: 17.5 % — AB (ref 11.5–15.5)
WBC: 12.7 10*3/uL — ABNORMAL HIGH (ref 4.0–10.5)

## 2016-10-31 LAB — I-STAT TROPONIN, ED: TROPONIN I, POC: 0 ng/mL (ref 0.00–0.08)

## 2016-10-31 LAB — URINALYSIS, ROUTINE W REFLEX MICROSCOPIC
Bilirubin Urine: NEGATIVE
GLUCOSE, UA: NEGATIVE mg/dL
HGB URINE DIPSTICK: NEGATIVE
Ketones, ur: NEGATIVE mg/dL
LEUKOCYTES UA: NEGATIVE
Nitrite: NEGATIVE
PROTEIN: 100 mg/dL — AB
SPECIFIC GRAVITY, URINE: 1.014 (ref 1.005–1.030)
pH: 5 (ref 5.0–8.0)

## 2016-10-31 LAB — BASIC METABOLIC PANEL
ANION GAP: 11 (ref 5–15)
BUN: 41 mg/dL — ABNORMAL HIGH (ref 6–20)
CHLORIDE: 101 mmol/L (ref 101–111)
CO2: 27 mmol/L (ref 22–32)
Calcium: 9.5 mg/dL (ref 8.9–10.3)
Creatinine, Ser: 2.9 mg/dL — ABNORMAL HIGH (ref 0.44–1.00)
GFR calc non Af Amer: 17 mL/min — ABNORMAL LOW (ref >60)
GFR, EST AFRICAN AMERICAN: 20 mL/min — AB (ref >60)
Glucose, Bld: 185 mg/dL — ABNORMAL HIGH (ref 65–99)
POTASSIUM: 4.3 mmol/L (ref 3.5–5.1)
SODIUM: 139 mmol/L (ref 135–145)

## 2016-10-31 LAB — GLUCOSE, CAPILLARY
GLUCOSE-CAPILLARY: 182 mg/dL — AB (ref 65–99)
GLUCOSE-CAPILLARY: 221 mg/dL — AB (ref 65–99)

## 2016-10-31 LAB — BRAIN NATRIURETIC PEPTIDE: B Natriuretic Peptide: 108.4 pg/mL — ABNORMAL HIGH (ref 0.0–100.0)

## 2016-10-31 LAB — URIC ACID: Uric Acid, Serum: 9.3 mg/dL — ABNORMAL HIGH (ref 2.3–6.6)

## 2016-10-31 MED ORDER — ONDANSETRON HCL 4 MG/2ML IJ SOLN: 4.0000 mg | Freq: Four times a day (QID) | INTRAMUSCULAR | Status: DC | PRN

## 2016-10-31 MED ORDER — SODIUM CHLORIDE 0.9% FLUSH: 3.0000 mL | INTRAVENOUS | Status: DC | PRN

## 2016-10-31 MED ORDER — ALBUTEROL SULFATE HFA 108 (90 BASE) MCG/ACT IN AERS: 1.0000 | INHALATION_SPRAY | Freq: Four times a day (QID) | RESPIRATORY_TRACT | Status: DC | PRN

## 2016-10-31 MED ORDER — SODIUM CHLORIDE 0.9 % IV SOLN: 250.0000 mL | INTRAVENOUS | Status: DC | PRN

## 2016-10-31 MED ORDER — ALLOPURINOL 300 MG PO TABS
300.0000 mg | ORAL_TABLET | Freq: Every day | ORAL | Status: DC
  Administered 2016-10-31 – 2016-11-02 (×3): 300 mg via ORAL
  Filled 2016-10-31 (×3): qty 1

## 2016-10-31 MED ORDER — FUROSEMIDE 10 MG/ML IJ SOLN: 40.0000 mg | Freq: Two times a day (BID) | INTRAMUSCULAR | Status: DC

## 2016-10-31 MED ORDER — ASPIRIN EC 81 MG PO TBEC
81.0000 mg | DELAYED_RELEASE_TABLET | Freq: Every day | ORAL | Status: DC
  Administered 2016-11-01 – 2016-11-03 (×3): 81 mg via ORAL
  Filled 2016-10-31 (×3): qty 1

## 2016-10-31 MED ORDER — FUROSEMIDE 10 MG/ML IJ SOLN
40.0000 mg | Freq: Two times a day (BID) | INTRAMUSCULAR | Status: DC
  Administered 2016-10-31 – 2016-11-01 (×2): 40 mg via INTRAVENOUS
  Filled 2016-10-31 (×2): qty 4

## 2016-10-31 MED ORDER — INSULIN ASPART 100 UNIT/ML ~~LOC~~ SOLN
0.0000 [IU] | Freq: Three times a day (TID) | SUBCUTANEOUS | Status: DC
  Administered 2016-10-31: 2 [IU] via SUBCUTANEOUS
  Administered 2016-11-01: 3 [IU] via SUBCUTANEOUS
  Administered 2016-11-01 (×2): 2 [IU] via SUBCUTANEOUS
  Administered 2016-11-02: 1 [IU] via SUBCUTANEOUS
  Administered 2016-11-03: 2 [IU] via SUBCUTANEOUS

## 2016-10-31 MED ORDER — SODIUM CHLORIDE 0.9% FLUSH
3.0000 mL | Freq: Two times a day (BID) | INTRAVENOUS | Status: DC
  Administered 2016-10-31 – 2016-11-03 (×6): 3 mL via INTRAVENOUS

## 2016-10-31 MED ORDER — METOPROLOL TARTRATE 50 MG PO TABS
50.0000 mg | ORAL_TABLET | Freq: Two times a day (BID) | ORAL | Status: DC
  Administered 2016-10-31 – 2016-11-03 (×7): 50 mg via ORAL
  Filled 2016-10-31: qty 2
  Filled 2016-10-31 (×6): qty 1

## 2016-10-31 MED ORDER — FENTANYL CITRATE (PF) 100 MCG/2ML IJ SOLN
100.0000 ug | Freq: Once | INTRAMUSCULAR | Status: AC
  Administered 2016-10-31: 100 ug via INTRAVENOUS
  Filled 2016-10-31: qty 2

## 2016-10-31 MED ORDER — HEPARIN SODIUM (PORCINE) 5000 UNIT/ML IJ SOLN
5000.0000 [IU] | Freq: Three times a day (TID) | INTRAMUSCULAR | Status: DC
  Administered 2016-10-31 – 2016-11-03 (×8): 5000 [IU] via SUBCUTANEOUS
  Filled 2016-10-31 (×8): qty 1

## 2016-10-31 MED ORDER — PANTOPRAZOLE SODIUM 40 MG PO TBEC
40.0000 mg | DELAYED_RELEASE_TABLET | Freq: Every day | ORAL | Status: DC
  Administered 2016-10-31 – 2016-11-03 (×4): 40 mg via ORAL
  Filled 2016-10-31 (×4): qty 1

## 2016-10-31 MED ORDER — AMLODIPINE BESYLATE 10 MG PO TABS
10.0000 mg | ORAL_TABLET | Freq: Every day | ORAL | Status: DC
  Administered 2016-10-31 – 2016-11-02 (×3): 10 mg via ORAL
  Filled 2016-10-31 (×2): qty 1
  Filled 2016-10-31: qty 2

## 2016-10-31 MED ORDER — HYDRALAZINE HCL 25 MG PO TABS
25.0000 mg | ORAL_TABLET | Freq: Two times a day (BID) | ORAL | Status: DC
  Administered 2016-10-31 – 2016-11-03 (×7): 25 mg via ORAL
  Filled 2016-10-31 (×7): qty 1

## 2016-10-31 MED ORDER — ROSUVASTATIN CALCIUM 40 MG PO TABS
40.0000 mg | ORAL_TABLET | Freq: Every evening | ORAL | Status: DC
  Administered 2016-10-31 – 2016-11-02 (×3): 40 mg via ORAL
  Filled 2016-10-31 (×3): qty 1

## 2016-10-31 MED ORDER — FUROSEMIDE 10 MG/ML IJ SOLN
80.0000 mg | Freq: Once | INTRAMUSCULAR | Status: AC
  Administered 2016-10-31: 80 mg via INTRAVENOUS
  Filled 2016-10-31: qty 8

## 2016-10-31 MED ORDER — INSULIN GLARGINE 100 UNIT/ML ~~LOC~~ SOLN
30.0000 [IU] | Freq: Every day | SUBCUTANEOUS | Status: DC
  Administered 2016-10-31 – 2016-11-02 (×3): 30 [IU] via SUBCUTANEOUS
  Filled 2016-10-31 (×4): qty 0.3

## 2016-10-31 MED ORDER — ACETAMINOPHEN 325 MG PO TABS
650.0000 mg | ORAL_TABLET | ORAL | Status: DC | PRN
  Administered 2016-10-31 – 2016-11-01 (×3): 650 mg via ORAL
  Filled 2016-10-31 (×5): qty 2

## 2016-10-31 NOTE — H&P (Signed)
History and Physical    AVERIANNA Williams FWY:[id number] DOB: 10/02/1961 DOA: 10/31/2016   PCP: Catherine Fendt, MD   Patient coming from:  Home   Chief Complaint: Shortness of breath   HPI: Catherine Williams is a 55 y.o. female recently admitted from 11/5 to 11/7 for acute on chronic diastolic CHF exacerbation, presenting with increasing shortness of breath. Symptoms worsened overnight. She was compliant with her meds, but she does admit to increased intake of bacon and fried foods. She reprts difficulty walking short distances. She reports vague chest disomfort symptoms with movement. She fenies any fevers, chills or night sweats.. She reports no long distance trips since last discharge.Denies nausea or vomiting. No hormonal or over the counter medical products.  She reports chronic leg swelling R>L, denies right calf pain, and reports possible flare of her gout in her feet. She was compliant with her medications.   ED Course:  BP 128/56   Pulse 84   Temp 99.9 F (37.7 C) (Oral)   Resp 21   Ht 5\' 3"  (1.6 m)   Wt (!) 152.4 kg (336 lb)   SpO2 96%   BMI 59.52 kg/m    CXR confims pulm congestion/pulm edema . Tn neg.  EKG without ACS BNP elevated. Received 80 mg LAsix with improvement of her respiratory symptoms. Not hypoxic. Remained in the 90s Osats in home O2 .  Last 2 D echo Nov 2017 showed EF 60-65% Gr1 DD.  Current weight 152.4  (was 153.9 on 11/5 last admission ) sodium 139 potassium 4.3 creatinine 2.9  BNP 108.4 troponin 0 white count  12.7  glucose 185 TSH 3.661  Review of Systems: As per HPI otherwise 10 point review of systems negative.   Past Medical History:  Diagnosis Date  . Anemia   . Arthritis Dec. 2014   Gout  . Asthma   . CHF (congestive heart failure) ([place])   . COPD (chronic obstructive pulmonary disease) ([place])   . Diabetes mellitus   . Heart failure   . Hyperlipidemia   . Hypertension   . Kidney disease   . Morbid obesity ([place])   . Peripheral vascular  disease ([place])   . Pinched nerve   . Sleep apnea     Past Surgical History:  Procedure Laterality Date  . Atherectomy and angioplasty  10/18/2011   left posterior tibial artery  . HAND SURGERY     right  . OVARY SURGERY     Left  . TOE AMPUTATION  Sept. 25,2012   Left 4th and 5th toes    Social History Social History   Social History  . Marital status: Single    Spouse name: N/A  . Number of children: Y  . Years of education: N/A   Occupational History  . 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   Social History Main Topics  . Smoking status: Never Smoker  . Smokeless tobacco: Never Used  . Alcohol use No  . Drug use:     Types: Marijuana     Comment: FORMER>>smoked weed from age 41 to 38>> quit at age 32   . Sexual activity: Not on file   Other Topics Concern  . Not on file   Social History Narrative   Lives with daughter           Allergies  Allergen Reactions  . Omnipaque [Iohexol] Nausea And Vomiting    Contrast Dye- Effects Kidney's    Family  History  Problem Relation Age of Onset  . Lung cancer Mother   . Clotting disorder Mother   . Heart disease Mother   . Cancer Mother     Lung  . Deep vein thrombosis Mother   . Diabetes Mother   . Hypertension Mother   . Varicose Veins Mother   . Cancer Father   . Clotting disorder Sister   . Heart attack Sister   . Diabetes Sister   . Clotting disorder Brother   . Deep vein thrombosis Brother   . Diabetes Brother   . Heart disease Brother   . Heart disease Sister       Prior to Admission medications   Medication Sig Start Date End Date Taking? Authorizing Provider  albuterol (PROVENTIL HFA;VENTOLIN HFA) 108 (90 BASE) MCG/ACT inhaler Inhale 1-2 puffs into the lungs every 6 (six) hours as needed for wheezing. 07/25/13  Yes Harden Mo, MD  allopurinol (ZYLOPRIM) 300 MG tablet Take 300 mg by mouth at bedtime.   Yes Historical Provider, MD  amLODipine (NORVASC) 10 MG tablet Take 10 mg  by mouth daily.     Yes Historical Provider, MD  aspirin 81 MG tablet Take 81 mg by mouth daily.     Yes Historical Provider, MD  calcitRIOL (ROCALTROL) 0.25 MCG capsule Take 0.25 mcg by mouth daily.   Yes Historical Provider, MD  cyclobenzaprine (FLEXERIL) 10 MG tablet Take 10 mg by mouth 3 (three) times daily as needed for muscle spasms.   Yes Historical Provider, MD  hydrALAZINE (APRESOLINE) 25 MG tablet Take 1 tablet (25 mg total) by mouth 2 (two) times daily. 07/21/15  Yes Barton Dubois, MD  insulin aspart (NOVOLOG) 100 UNIT/ML injection Inject 20 Units into the skin 3 (three) times daily with meals.    Yes Historical Provider, MD  insulin glargine (LANTUS) 100 UNIT/ML injection Inject 60 Units into the skin at bedtime.    Yes Historical Provider, MD  latanoprost (XALATAN) 0.005 % ophthalmic solution Place 1 drop into both eyes at bedtime.   Yes Historical Provider, MD  metoprolol (LOPRESSOR) 50 MG tablet Take 50 mg by mouth 2 (two) times daily.     Yes Historical Provider, MD  Omega-3 Fatty Acids (FISH OIL PO) Take 1 capsule by mouth daily.   Yes Historical Provider, MD  pantoprazole (PROTONIX) 40 MG tablet Take 1 tablet (40 mg total) by mouth daily. 07/21/15  Yes Barton Dubois, MD  rosuvastatin (CRESTOR) 40 MG tablet Take 40 mg by mouth every evening.    Yes Historical Provider, MD  torsemide (DEMADEX) 20 MG tablet Take 2 tablets (40 mg total) by mouth 2 (two) times daily. 07/21/15  Yes Barton Dubois, MD  VITAMIN E PO Take 1 capsule by mouth daily.   Yes Historical Provider, MD    Physical Exam:    Vitals:   10/31/16 1315 10/31/16 1345 10/31/16 1415 10/31/16 1445  BP: 150/64 159/67 145/64 128/56  Pulse: 82 84 81 84  Resp: (!) 31 (!) 27 25 21   Temp:      TempSrc:      SpO2: 97% 95% 97% 96%  Weight:      Height:           Constitutional: NAD, calm,tearful  Vitals:   10/31/16 1315 10/31/16 1345 10/31/16 1415 10/31/16 1445  BP: 150/64 159/67 145/64 128/56  Pulse: 82 84 81 84    Resp: (!) 31 (!) 27 25 21   Temp:      TempSrc:  SpO2: 97% 95% 97% 96%  Weight:      Height:       Eyes: PERRL, lids and conjunctivae normal ENMT: Mucous membranes are moist. Posterior pharynx clear of any exudate or lesions.Normal dentition.  Neck: normal, supple, no masses, no thyromegaly Respiratory: decreased breath sounds at the bases , no wheezing, no crackles. Normal respiratory effort. No accessory muscle use.  Cardiovascular: Regular rate and rhythm, no murmurs / rubs / gallops.  Chronic R>L  edema. 2+ pedal pulses. No carotid bruits.  Abdomen: morbidly obese no tenderness, no masses palpated. No hepatosplenomegaly. Bowel sounds positive.  Musculoskeletal: no clubbing / cyanosis. No joint deformity upper and lower extremities. Good ROM, no contractures. Normal muscle tone.  Skin: no rashes, lesions, ulcers.  Neurologic: CN 2-12 grossly intact. Sensation intact, DTR normal. Strength 5/5 in all 4.  Psychiatric: Normal judgment and insight. Alert and oriented x 3.     Labs on Admission: I have personally reviewed following labs and imaging studies  CBC:  Recent Labs Lab 10/31/16 1059  WBC 12.7*  HGB 8.4*  HCT 29.1*  MCV 81.3  PLT 793    Basic Metabolic Panel:  Recent Labs Lab 10/31/16 1059  NA 139  K 4.3  CL 101  CO2 27  GLUCOSE 185*  BUN 41*  CREATININE 2.90*  CALCIUM 9.5    GFR: Estimated Creatinine Clearance: 31.2 mL/min (by C-G formula based on SCr of 2.9 mg/dL (H)).  Liver Function Tests: No results for input(s): AST, ALT, ALKPHOS, BILITOT, PROT, ALBUMIN in the last 168 hours. No results for input(s): LIPASE, AMYLASE in the last 168 hours. No results for input(s): AMMONIA in the last 168 hours.  Coagulation Profile: No results for input(s): INR, PROTIME in the last 168 hours.  Cardiac Enzymes: No results for input(s): CKTOTAL, CKMB, CKMBINDEX, TROPONINI in the last 168 hours.  BNP (last 3 results) No results for input(s): PROBNP in the  last 8760 hours.  HbA1C: No results for input(s): HGBA1C in the last 72 hours.  CBG: No results for input(s): GLUCAP in the last 168 hours.  Lipid Profile: No results for input(s): CHOL, HDL, LDLCALC, TRIG, CHOLHDL, LDLDIRECT in the last 72 hours.  Thyroid Function Tests: No results for input(s): TSH, T4TOTAL, FREET4, T3FREE, THYROIDAB in the last 72 hours.  Anemia Panel: No results for input(s): VITAMINB12, FOLATE, FERRITIN, TIBC, IRON, RETICCTPCT in the last 72 hours.  Urine analysis:    Component Value Date/Time   COLORURINE YELLOW 07/19/2015 1939   APPEARANCEUR CLEAR 07/19/2015 1939   LABSPEC 1.010 07/19/2015 1939   PHURINE 5.0 07/19/2015 1939   GLUCOSEU NEGATIVE 07/19/2015 1939   HGBUR NEGATIVE 07/19/2015 1939   BILIRUBINUR NEGATIVE 07/19/2015 1939   KETONESUR NEGATIVE 07/19/2015 1939   PROTEINUR NEGATIVE 07/19/2015 1939   UROBILINOGEN 0.2 07/19/2015 1939   NITRITE NEGATIVE 07/19/2015 1939   LEUKOCYTESUR NEGATIVE 07/19/2015 1939    Sepsis Labs: @LABRCNTIP (procalcitonin:4,lacticidven:4) )No results found for this or any previous visit (from the past 240 hour(s)).   Radiological Exams on Admission: Dg Chest Portable 1 View  Result Date: 10/31/2016 CLINICAL DATA:  55 year old female with chest pain upon waking. Bilateral leg pain. Subsequent encounter. EXAM: PORTABLE CHEST 1 VIEW COMPARISON:  10/23/2016 and 07/19/2015. FINDINGS: Limited portable exam with chin overlying the lung apex and poor inspiration. Pulmonary vascular congestion/ pulmonary edema. Heart size top-normal. Remaining exam is limited. IMPRESSION: Limited portable exam with chin overlying the lung apex and poor inspiration. Pulmonary vascular congestion/ pulmonary edema. Electronically Signed  By: Genia Del M.D.   On: 10/31/2016 10:44    EKG: Independently reviewed.  Assessment/Plan Active Problems:   Acute on chronic respiratory failure (HCC)   Acute on chronic diastolic CHF (congestive heart  failure) (HCC)   OSA (obstructive sleep apnea)   CKD (chronic kidney disease) stage 3, GFR 30-59 ml/min   Diabetes mellitus type 2, controlled (HCC)   Chronic anemia   Hypertension   Severe obesity (BMI >= 40) (HCC)   CHF exacerbation (HCC)   Neuropathy (HCC)    Acute on chronic respiratory failure likely due to acute on  chronic  congestive heart failure with bilateral pulmonary edema. Other factors to increase her dyspnia include OSA, restrictive lung disease (Dr. Melvyn Novas)  . CXR confims pulm congestion/pulm edema . Tn neg.  EKG without ACS BNP 108.  Received 80 mg Lasix with improvement of her respiratory symptoms. Not hypoxic. Remained in the 90s Osats in home O2 . Last 2 D echo Nov 2017 showed EF 60-65% Gr1 DD. Current weight 152.4  (was 153.9 on 11/5 last admission ) - Telemetry - Daily weights and strict I/O Low sodium diet  - continue beta blocker  Lasix 40 mg IV BID, as Demadex has been held - Continue aspirin - 2 gm sodium diet with 1.2L fluid restriction  CPAP nightlly   Hypertension BP 128/56  Controlled Continue home anti-hypertensive medications   Hyperlipidemia Continue home statins  Type II Diabetes with known neuropathy Current blood sugar level is 185 Lab Results  Component Value Date   HGBA1C 8.0 (H) 10/23/2016   Lantus , SSI Start Neurontin 100 po tid for his foot neuropathy .  Will check Uric acid to check for other causes such as gout flare in order to adjust her daily dose if necessary     Hypothyroidism: -Continue home Synthroid  Chronic kidney disease stage 4  baseline creatinine 2.5   Current Cr 2.9 Lab Results  Component Value Date   CREATININE 2.90 (H) 10/31/2016   CREATININE 2.55 (H) 10/24/2016   CREATININE 2.55 (H) 10/23/2016   Repeat CMET in am in the setting of need for diuresis    Leukocytosis, likely reactive  WBC 12.7  In the setting of current respiratory status  Afebrile      Repeat CBC in AM, monitor closely  Cultures     Anemia of chronic disease Hemoglobin on admission 8.4 (9.2 1 wk ago). No apparent bleeding issues noted   Repeat CBC in am No transfusion indicated at this time    DVT prophylaxis:  Heparin   Code Status:   Full   Family Communication:  Discussed with patient Disposition Plan: Expect patient to be discharged to home after condition improves Consults called:    None Admission status:Tele  Obs    Tishara Pizano E, PA-C [facility]   10/31/2016, 3:41 PM

## 2016-10-31 NOTE — ED Provider Notes (Signed)
[facility] DEPT Provider Note   CSN: [id number] Arrival date & time: 10/31/16  1008     History   Chief Complaint Chief Complaint  Patient presents with  . Chest Pain  . Leg Pain    HPI Catherine Williams is a 55 y.o. female.   Shortness of Breath  This is a recurrent problem. The average episode lasts 8 hours. The problem occurs continuously.The current episode started 6 to 12 hours ago. The problem has been gradually worsening. Associated symptoms include cough, orthopnea and chest pain. Pertinent negatives include no fever and no rhinorrhea. She has tried nothing for the symptoms.    Past Medical History:  Diagnosis Date  . Anemia   . Arthritis Dec. 2014   Gout  . Asthma   . CHF (congestive heart failure) ([place])   . COPD (chronic obstructive pulmonary disease) ([place])   . Diabetes mellitus   . Heart failure   . Hyperlipidemia   . Hypertension   . Kidney disease   . Morbid obesity ([place])   . Peripheral vascular disease ([place])   . Pinched nerve   . Sleep apnea     Patient Active Problem List   Diagnosis Date Noted  . CHF exacerbation ([place]) 10/31/2016  . Neuropathy ([place]) 10/31/2016  . Diabetes mellitus with complication ([place])   . Shortness of breath 10/23/2016  . Acute on chronic diastolic CHF (congestive heart failure) ([place]) 10/23/2016  . GERD (gastroesophageal reflux disease) 10/23/2016  . Severe obesity (BMI >= 40) ([place]) 08/28/2015  . Dyspnea 08/27/2015  . Type 2 diabetes with nephropathy ([place])   . Acute on chronic respiratory failure ([place]) 07/17/2015  . CKD (chronic kidney disease) stage 3, GFR 30-59 ml/min 07/17/2015  . Diabetes mellitus type 2, controlled ([place]) 07/17/2015  . Chronic anemia 07/17/2015  . Hypertension 07/17/2015  . Acute on chronic diastolic heart failure ([place])   . Aftercare following surgery of the circulatory system, [place] 12/30/2013  . Peripheral vascular disease, unspecified 05/07/2012  . Visit for wound check 12/19/2011  . OSA  (obstructive sleep apnea) 04/05/2011    Past Surgical History:  Procedure Laterality Date  . Atherectomy and angioplasty  10/18/2011   left posterior tibial artery  . HAND SURGERY     right  . OVARY SURGERY     Left  . 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 Authorizing Provider  albuterol (PROVENTIL HFA;VENTOLIN HFA) 108 (90 BASE) MCG/ACT inhaler Inhale 1-2 puffs into the lungs every 6 (six) hours as needed for wheezing. 07/25/13  Yes Harden Mo, MD  allopurinol (ZYLOPRIM) 300 MG tablet Take 300 mg by mouth at bedtime.   Yes Historical Provider, MD  amLODipine (NORVASC) 10 MG tablet Take 10 mg by mouth daily.     Yes Historical Provider, MD  aspirin 81 MG tablet Take 81 mg by mouth daily.     Yes Historical Provider, MD  calcitRIOL (ROCALTROL) 0.25 MCG capsule Take 0.25 mcg by mouth daily.   Yes Historical Provider, MD  cyclobenzaprine (FLEXERIL) 10 MG tablet Take 10 mg by mouth 3 (three) times daily as needed for muscle spasms.   Yes Historical Provider, MD  hydrALAZINE (APRESOLINE) 25 MG tablet Take 1 tablet (25 mg total) by mouth 2 (two) times daily. 07/21/15  Yes Barton Dubois, MD  insulin aspart (NOVOLOG) 100 UNIT/ML injection Inject 20 Units into the skin 3 (three) times daily with meals.    Yes Historical Provider, MD  insulin glargine (LANTUS) 100 UNIT/ML injection Inject 60 Units into the skin at bedtime.    Yes Historical Provider, MD  latanoprost (XALATAN) 0.005 % ophthalmic solution Place 1 drop into both eyes at bedtime.   Yes Historical Provider, MD  metoprolol (LOPRESSOR) 50 MG tablet Take 50 mg by mouth 2 (two) times daily.     Yes Historical Provider, MD  Omega-3 Fatty Acids (FISH OIL PO) Take 1 capsule by mouth daily.   Yes Historical Provider, MD  pantoprazole (PROTONIX) 40 MG tablet Take 1 tablet (40 mg total)  by mouth daily. 07/21/15  Yes Barton Dubois, MD  rosuvastatin (CRESTOR) 40 MG tablet Take 40 mg by mouth every evening.    Yes Historical Provider, MD  torsemide (DEMADEX) 20 MG tablet Take 2 tablets (40 mg total) by mouth 2 (two) times daily. 07/21/15  Yes Barton Dubois, MD  VITAMIN E PO Take 1 capsule by mouth daily.   Yes Historical Provider, MD    Family History Family History  Problem Relation Age of Onset  . Lung cancer Mother   . Clotting disorder Mother   . Heart disease Mother   . Cancer Mother     Lung  . Deep vein thrombosis Mother   . Diabetes Mother   . Hypertension Mother   . Varicose Veins Mother   . Cancer Father   . Clotting disorder Sister   . Heart attack Sister   . Diabetes Sister   . Clotting disorder Brother   . Deep vein thrombosis Brother   . Diabetes Brother   . Heart disease Brother   . Heart disease Sister     Social History Social History  Substance Use Topics  . Smoking status: Never Smoker  . Smokeless tobacco: Never Used  . Alcohol use No     Allergies   Omnipaque [iohexol]   Review of Systems Review of Systems  Constitutional: Negative for fever.  HENT: Negative for rhinorrhea.   Respiratory: Positive for cough and shortness of breath.   Cardiovascular: Positive for chest pain and orthopnea.  All other systems reviewed and are negative.    Physical Exam Updated Vital Signs BP (!) 152/61 (BP Location: Right Arm)   Pulse 80   Temp 100.1 F (37.8 C) (Oral)   Resp (!) 32   Ht 5\' 3"  (1.6 m)   Wt (!) 336 lb (152.4 kg)   SpO2 100%   BMI 59.52 kg/m   Physical Exam  Constitutional: She appears well-developed and well-nourished.  HENT:  Head: Normocephalic and atraumatic.  Eyes: Conjunctivae and EOM are normal.  Neck: Normal range of motion.  Cardiovascular: Normal rate and regular rhythm.   Difficult to auscultate  Pulmonary/Chest: Effort normal. No stridor. No respiratory distress.  Difficult to auscultate 2/2 body habitus    Abdominal: Soft. She exhibits no distension.  Neurological: She is alert.  Skin: Skin is warm and dry.  Nursing note and vitals reviewed.    ED Treatments / Results  Labs (all labs ordered are listed, but only abnormal results are displayed) Labs Reviewed  BASIC METABOLIC PANEL - Abnormal; Notable for the following:       Result Value   Glucose, Bld 185 (*)    BUN 41 (*)    Creatinine, Ser 2.90 (*)    GFR calc  non Af Amer 17 (*)    GFR calc Af Amer 20 (*)    All other components within normal limits  CBC - Abnormal; Notable for the following:    WBC 12.7 (*)    RBC 3.58 (*)    Hemoglobin 8.4 (*)    HCT 29.1 (*)    MCH 23.5 (*)    MCHC 28.9 (*)    RDW 17.5 (*)    All other components within normal limits  BRAIN NATRIURETIC PEPTIDE - Abnormal; Notable for the following:    B Natriuretic Peptide 108.4 (*)    All other components within normal limits  URIC ACID - Abnormal; Notable for the following:    Uric Acid, Serum 9.3 (*)    All other components within normal limits  GLUCOSE, CAPILLARY - Abnormal; Notable for the following:    Glucose-Capillary 182 (*)    All other components within normal limits  CBC WITH DIFFERENTIAL/PLATELET  COMPREHENSIVE METABOLIC PANEL  URINALYSIS, ROUTINE W REFLEX MICROSCOPIC (NOT AT [facility])  I-STAT TROPOININ, ED    EKG  EKG Interpretation  Date/Time:  Monday October 31 2016 10:21:24 EST Ventricular Rate:  106 PR Interval:    QRS Duration: 113 QT Interval:  363 QTC Calculation: 482 R Axis:   -59 Text Interpretation:  Sinus tachycardia Low voltage, precordial leads LVH with IVCD, LAD and secondary repol abnrm Rate faster otherwise no significant change since last Confirmed by [facility] MD, Corene Cornea [phone]) on 10/31/2016 10:27:33 AM Also confirmed by [facility] MD, Corene Cornea [phone]), editor [place], Joelene Millin [phone])  on 10/31/2016 11:06:54 AM       Radiology Dg Chest Portable 1 View  Result Date: 10/31/2016 CLINICAL DATA:  55 year old female with  chest pain upon waking. Bilateral leg pain. Subsequent encounter. EXAM: PORTABLE CHEST 1 VIEW COMPARISON:  10/23/2016 and 07/19/2015. FINDINGS: Limited portable exam with chin overlying the lung apex and poor inspiration. Pulmonary vascular congestion/ pulmonary edema. Heart size top-normal. Remaining exam is limited. IMPRESSION: Limited portable exam with chin overlying the lung apex and poor inspiration. Pulmonary vascular congestion/ pulmonary edema. Electronically Signed   By: Genia Del M.D.   On: 10/31/2016 10:44    Procedures Procedures (including critical care time)  Medications Ordered in ED Medications  amLODipine (NORVASC) tablet 10 mg (10 mg Oral Given 10/31/16 1116)  hydrALAZINE (APRESOLINE) tablet 25 mg (25 mg Oral Given 10/31/16 1116)  metoprolol tartrate (LOPRESSOR) tablet 50 mg (50 mg Oral Given 10/31/16 1116)  allopurinol (ZYLOPRIM) tablet 300 mg (not administered)  pantoprazole (PROTONIX) EC tablet 40 mg (40 mg Oral Given 10/31/16 1753)  rosuvastatin (CRESTOR) tablet 40 mg (40 mg Oral Given 10/31/16 1753)  aspirin EC tablet 81 mg (81 mg Oral Not Given 10/31/16 1620)  insulin glargine (LANTUS) injection 30 Units (not administered)  sodium chloride flush (NS) 0.9 % injection 3 mL (not administered)  sodium chloride flush (NS) 0.9 % injection 3 mL (not administered)  0.9 %  sodium chloride infusion (not administered)  acetaminophen (TYLENOL) tablet 650 mg (650 mg Oral Given 10/31/16 1754)  ondansetron (ZOFRAN) injection 4 mg (not administered)  heparin injection 5,000 Units (5,000 Units Subcutaneous Not Given 10/31/16 1600)  insulin aspart (novoLOG) injection 0-9 Units (2 Units Subcutaneous Given 10/31/16 1700)  furosemide (LASIX) injection 40 mg (not administered)  fentaNYL (SUBLIMAZE) injection 100 mcg (100 mcg Intravenous Given 10/31/16 1118)  furosemide (LASIX) injection 80 mg (80 mg Intravenous Given 10/31/16 1619)     Initial Impression / Assessment and Plan / ED  Course  I have reviewed the triage vital signs and the nursing notes.  Pertinent labs & imaging results that were available during my care of the patient were reviewed by me and considered in my medical decision making (see chart for details).  Clinical Course     Concern for possible chf exacerbation with increasing leg swelling and pain along with CP, pulm edema on CXR, slightly elevated BNP from previous. I gave 80 IV lasix, but not great response, so will admit for further management.   Final Clinical Impressions(s) / ED Diagnoses   Final diagnoses:  CHF exacerbation [facility])    New Prescriptions Current Discharge Medication List       Merrily Pew, MD 10/31/16 [phone]

## 2016-10-31 NOTE — ED Notes (Signed)
Attempted report 

## 2016-10-31 NOTE — ED Triage Notes (Signed)
Pt. Coming from home via [facility] for chest pain that started today upon waking. Pt. Describes it as central sharp pain that radiates to the right. Pt. Took 4 81mg  ASA PTA and 2 doses of SL nitro with no relief. Pt. Hx of CHF/HTN/DM. Pt. Also c/o bilateral leg pain since Monday.

## 2016-10-31 NOTE — ED Notes (Addendum)
Lab contacted about BNP not resulting. BNP accepted by lab at 1107. EDP notified.   Delay in results due to duplicate orders by EDP and RN. BNP being run at this time.

## 2016-11-01 ENCOUNTER — Observation Stay (HOSPITAL_COMMUNITY): Payer: Medicare Other

## 2016-11-01 ENCOUNTER — Observation Stay (HOSPITAL_BASED_OUTPATIENT_CLINIC_OR_DEPARTMENT_OTHER): Payer: Medicare Other

## 2016-11-01 DIAGNOSIS — M79609 Pain in unspecified limb: Secondary | ICD-10-CM | POA: Diagnosis not present

## 2016-11-01 DIAGNOSIS — Z794 Long term (current) use of insulin: Secondary | ICD-10-CM | POA: Diagnosis not present

## 2016-11-01 DIAGNOSIS — I5033 Acute on chronic diastolic (congestive) heart failure: Secondary | ICD-10-CM | POA: Diagnosis not present

## 2016-11-01 DIAGNOSIS — Z8249 Family history of ischemic heart disease and other diseases of the circulatory system: Secondary | ICD-10-CM | POA: Diagnosis not present

## 2016-11-01 DIAGNOSIS — Z9119 Patient's noncompliance with other medical treatment and regimen: Secondary | ICD-10-CM | POA: Diagnosis not present

## 2016-11-01 DIAGNOSIS — N184 Chronic kidney disease, stage 4 (severe): Secondary | ICD-10-CM

## 2016-11-01 DIAGNOSIS — Z832 Family history of diseases of the blood and blood-forming organs and certain disorders involving the immune mechanism: Secondary | ICD-10-CM | POA: Diagnosis not present

## 2016-11-01 DIAGNOSIS — I10 Essential (primary) hypertension: Secondary | ICD-10-CM | POA: Diagnosis not present

## 2016-11-01 DIAGNOSIS — R609 Edema, unspecified: Secondary | ICD-10-CM

## 2016-11-01 DIAGNOSIS — E119 Type 2 diabetes mellitus without complications: Secondary | ICD-10-CM | POA: Diagnosis not present

## 2016-11-01 DIAGNOSIS — G4733 Obstructive sleep apnea (adult) (pediatric): Secondary | ICD-10-CM

## 2016-11-01 DIAGNOSIS — E1141 Type 2 diabetes mellitus with diabetic mononeuropathy: Secondary | ICD-10-CM | POA: Diagnosis present

## 2016-11-01 DIAGNOSIS — Z7982 Long term (current) use of aspirin: Secondary | ICD-10-CM | POA: Diagnosis not present

## 2016-11-01 DIAGNOSIS — E1151 Type 2 diabetes mellitus with diabetic peripheral angiopathy without gangrene: Secondary | ICD-10-CM | POA: Diagnosis present

## 2016-11-01 DIAGNOSIS — E785 Hyperlipidemia, unspecified: Secondary | ICD-10-CM | POA: Diagnosis present

## 2016-11-01 DIAGNOSIS — I130 Hypertensive heart and chronic kidney disease with heart failure and stage 1 through stage 4 chronic kidney disease, or unspecified chronic kidney disease: Secondary | ICD-10-CM | POA: Diagnosis present

## 2016-11-01 DIAGNOSIS — D649 Anemia, unspecified: Secondary | ICD-10-CM | POA: Diagnosis not present

## 2016-11-01 DIAGNOSIS — J9621 Acute and chronic respiratory failure with hypoxia: Secondary | ICD-10-CM | POA: Diagnosis not present

## 2016-11-01 DIAGNOSIS — E662 Morbid (severe) obesity with alveolar hypoventilation: Secondary | ICD-10-CM | POA: Diagnosis present

## 2016-11-01 DIAGNOSIS — I5030 Unspecified diastolic (congestive) heart failure: Secondary | ICD-10-CM | POA: Diagnosis not present

## 2016-11-01 DIAGNOSIS — J9690 Respiratory failure, unspecified, unspecified whether with hypoxia or hypercapnia: Secondary | ICD-10-CM

## 2016-11-01 DIAGNOSIS — Z6841 Body Mass Index (BMI) 40.0 and over, adult: Secondary | ICD-10-CM | POA: Diagnosis not present

## 2016-11-01 DIAGNOSIS — R0602 Shortness of breath: Secondary | ICD-10-CM | POA: Diagnosis not present

## 2016-11-01 DIAGNOSIS — E1129 Type 2 diabetes mellitus with other diabetic kidney complication: Secondary | ICD-10-CM | POA: Diagnosis not present

## 2016-11-01 DIAGNOSIS — Z801 Family history of malignant neoplasm of trachea, bronchus and lung: Secondary | ICD-10-CM | POA: Diagnosis not present

## 2016-11-01 DIAGNOSIS — G629 Polyneuropathy, unspecified: Secondary | ICD-10-CM | POA: Diagnosis present

## 2016-11-01 DIAGNOSIS — R079 Chest pain, unspecified: Secondary | ICD-10-CM | POA: Diagnosis not present

## 2016-11-01 DIAGNOSIS — M109 Gout, unspecified: Secondary | ICD-10-CM | POA: Diagnosis present

## 2016-11-01 DIAGNOSIS — Z91041 Radiographic dye allergy status: Secondary | ICD-10-CM | POA: Diagnosis not present

## 2016-11-01 DIAGNOSIS — E1122 Type 2 diabetes mellitus with diabetic chronic kidney disease: Secondary | ICD-10-CM | POA: Diagnosis present

## 2016-11-01 DIAGNOSIS — D631 Anemia in chronic kidney disease: Secondary | ICD-10-CM | POA: Diagnosis present

## 2016-11-01 DIAGNOSIS — Z833 Family history of diabetes mellitus: Secondary | ICD-10-CM | POA: Diagnosis not present

## 2016-11-01 DIAGNOSIS — J449 Chronic obstructive pulmonary disease, unspecified: Secondary | ICD-10-CM | POA: Diagnosis present

## 2016-11-01 DIAGNOSIS — N179 Acute kidney failure, unspecified: Secondary | ICD-10-CM

## 2016-11-01 DIAGNOSIS — N183 Chronic kidney disease, stage 3 (moderate): Secondary | ICD-10-CM | POA: Diagnosis not present

## 2016-11-01 DIAGNOSIS — E1165 Type 2 diabetes mellitus with hyperglycemia: Secondary | ICD-10-CM

## 2016-11-01 DIAGNOSIS — I509 Heart failure, unspecified: Secondary | ICD-10-CM | POA: Diagnosis not present

## 2016-11-01 LAB — CBC WITH DIFFERENTIAL/PLATELET
BASOS PCT: 0 %
Basophils Absolute: 0 10*3/uL (ref 0.0–0.1)
EOS ABS: 0.1 10*3/uL (ref 0.0–0.7)
Eosinophils Relative: 1 %
HEMATOCRIT: 26.7 % — AB (ref 36.0–46.0)
Hemoglobin: 7.5 g/dL — ABNORMAL LOW (ref 12.0–15.0)
Lymphocytes Relative: 15 %
Lymphs Abs: 1.6 10*3/uL (ref 0.7–4.0)
MCH: 23 pg — ABNORMAL LOW (ref 26.0–34.0)
MCHC: 28.1 g/dL — AB (ref 30.0–36.0)
MCV: 81.9 fL (ref 78.0–100.0)
MONOS PCT: 8 %
Monocytes Absolute: 0.8 10*3/uL (ref 0.1–1.0)
Neutro Abs: 7.6 10*3/uL (ref 1.7–7.7)
Neutrophils Relative %: 76 %
Platelets: 273 10*3/uL (ref 150–400)
RBC: 3.26 MIL/uL — ABNORMAL LOW (ref 3.87–5.11)
RDW: 17.2 % — AB (ref 11.5–15.5)
WBC: 10 10*3/uL (ref 4.0–10.5)

## 2016-11-01 LAB — GLUCOSE, CAPILLARY
GLUCOSE-CAPILLARY: 181 mg/dL — AB (ref 65–99)
GLUCOSE-CAPILLARY: 212 mg/dL — AB (ref 65–99)
Glucose-Capillary: 170 mg/dL — ABNORMAL HIGH (ref 65–99)
Glucose-Capillary: 197 mg/dL — ABNORMAL HIGH (ref 65–99)

## 2016-11-01 LAB — IRON AND TIBC
Iron: 10 ug/dL — ABNORMAL LOW (ref 28–170)
Saturation Ratios: 5 % — ABNORMAL LOW (ref 10.4–31.8)
TIBC: 196 ug/dL — ABNORMAL LOW (ref 250–450)
UIBC: 186 ug/dL

## 2016-11-01 LAB — COMPREHENSIVE METABOLIC PANEL
ALBUMIN: 2.5 g/dL — AB (ref 3.5–5.0)
ALT: 11 U/L — ABNORMAL LOW (ref 14–54)
ANION GAP: 7 (ref 5–15)
AST: 14 U/L — ABNORMAL LOW (ref 15–41)
Alkaline Phosphatase: 60 U/L (ref 38–126)
BILIRUBIN TOTAL: 0.5 mg/dL (ref 0.3–1.2)
BUN: 50 mg/dL — ABNORMAL HIGH (ref 6–20)
CO2: 30 mmol/L (ref 22–32)
Calcium: 9.5 mg/dL (ref 8.9–10.3)
Chloride: 102 mmol/L (ref 101–111)
Creatinine, Ser: 3.49 mg/dL — ABNORMAL HIGH (ref 0.44–1.00)
GFR calc Af Amer: 16 mL/min — ABNORMAL LOW (ref >60)
GFR calc non Af Amer: 14 mL/min — ABNORMAL LOW (ref >60)
GLUCOSE: 177 mg/dL — AB (ref 65–99)
POTASSIUM: 4.5 mmol/L (ref 3.5–5.1)
SODIUM: 139 mmol/L (ref 135–145)
TOTAL PROTEIN: 6.7 g/dL (ref 6.5–8.1)

## 2016-11-01 LAB — PHOSPHORUS: PHOSPHORUS: 4 mg/dL (ref 2.5–4.6)

## 2016-11-01 LAB — PROTEIN / CREATININE RATIO, URINE
CREATININE, URINE: 111.35 mg/dL
PROTEIN CREATININE RATIO: 1 mg/mg{creat} — AB (ref 0.00–0.15)
TOTAL PROTEIN, URINE: 111 mg/dL

## 2016-11-01 LAB — FERRITIN: Ferritin: 632 ng/mL — ABNORMAL HIGH (ref 11–307)

## 2016-11-01 MED ORDER — FUROSEMIDE 10 MG/ML IJ SOLN
60.0000 mg | Freq: Two times a day (BID) | INTRAMUSCULAR | Status: DC
  Administered 2016-11-01 – 2016-11-03 (×3): 60 mg via INTRAVENOUS
  Filled 2016-11-01 (×4): qty 6

## 2016-11-01 NOTE — Progress Notes (Signed)
*  PRELIMINARY RESULTS* Vascular Ultrasound Bilateral lower extremity venous duplex has been completed.  Preliminary findings: Extremely difficult exam due to patient body habitus.  Negative for deep vein thrombosis in visualized veins of the the lower extremities.  Catherine Williams 11/01/2016, 4:21 PM

## 2016-11-01 NOTE — Consult Note (Signed)
   [facility] CM Inpatient Consult   11/01/2016  Catherine Williams May 30, 1961 [id number]   Referral for post hospital follow up.  Thank you for this consult.  Patient evaluated for [facility] services.  Patient is not eligible for [facility] Care Management services because her Primary Care Provider is no longer a [facility] primary care provider or is not [facility] affiliated.   This patient is Not eligible for [facility] Care Management Services.   Reason: Met with patient and patient endorses Dr. Nolene Ebbs as her Primary care provider and he is no longer on the  Membership roster for eligible status. Will update inpatient RNCM regarding referral. Patient to be followed by Home Health services per notes.  For questions, please contact:  Natividad Brood, RN BSN [facility] Liaison  [phone] business mobile phone Toll free office [phone]

## 2016-11-01 NOTE — Care Management Note (Signed)
Case Management Note  Patient Details  Name: NERY KALISZ MRN: [id number] Date of Birth: 1961-12-11  Subjective/Objective:         Admitted with CHF           Action/Plan: Patient lives alone, her daughter takes her to her apts. PCP Dr Jeanie Cooks; has private insurance with Medicare /Medicaid with prescription drug coverage; [facility] choice [facility]; they deliver her medication to her home; DME - home oxygen, cane, walker; she has scales at home and weighs herself daily. Patient states that she has been having difficulty maintaining a healthy diet and is open to a Nutritional consult to help her work out a diet plan; referral placed. Also she could benefit from a Disease Management Plan for CHF; [place] choice offered, pt chose [facility]; [place] with [facility] called for arrangements.  Expected Discharge Date:  Possible 11/05/2016           Expected Discharge Plan:  [place]  In-House Referral:   [facility], Nutritional Consult  Discharge planning Services  CM Consult    Choice offered to:  Patient  HH Arranged:  RN, Disease Management, PT [place] Agency:  [place]  Status of Service:   In progress  Sherrilyn Rist [phone] 11/01/2016, 10:38 AM

## 2016-11-01 NOTE — Progress Notes (Signed)
Nutrition Education Note  RD consulted for nutrition education regarding "diet plan for home".   Pt out of room at time of visit. RD familiar with patient from previous admission and met with pt for low sodium diet education on 10/24/16. At that time low sodium diet was reviewed in depth and tips for cooking and reading nutrition labels were discussed. Pt admitted to eating fried chicken, fried fish and french fries during previous admission. Per H&P 11/13, pt admitted to eating more bacon and fried foods.  Suspect that patient needs assistance beyond nutrition information, such as outpatient nutrition counseling. RD left information packet for the Nutrition and Diabetes Nutrition Education Services and recommended nutrition counseling. Also provided low sodium recipes from American Heart Association (https://www.weaver.com/) in patient room.   Current diet order is Heart Healthy/Carb Modified 1.2 L, patient is consuming approximately 100% of meals at this time. Labs and medications reviewed. No further nutrition interventions warranted at this time. RD will attempt follow-up at later date to discuss handouts and assess education needs.  If additional nutrition issues arise, please re-consult RD.   Scarlette Ar RD, CSP, LDN Inpatient Clinical Dietitian Pager: [phone] After Hours Pager: [phone]

## 2016-11-01 NOTE — Care Management Obs Status (Signed)
[place] NOTIFICATION   Patient Details  Name: STPEHANIE MONTROY MRN: [id number] Date of Birth: [birth year]   Medicare Observation Status Notification Given:  Yes    Royston Bake, RN 11/01/2016, 10:51 AM

## 2016-11-01 NOTE — Progress Notes (Signed)
Pt admitted to [facility] from ED. Temp is 100.3. PA Wertman made aware. Order given for UA.

## 2016-11-01 NOTE — Consult Note (Signed)
[facility] Consult Note     Date: 11/01/2016                  Patient Name:  Catherine Williams  MRN: [id number]  DOB: [birth year]  Age / Sex: 55 y.o., female         PCP: Philis Fendt, MD                 Service Requesting Consult: [facility], Dr. Carles Collet                 Reason for Consult: AKI on CKD in the setting of dCHF exac            Chief Complaint: SOB  HPI: Pt is a 54F with a PMH significant for HTN, HLD, OSA on CPAP, CKD GIV A3, and COPD who is now seen at the request of Dr. Carles Collet for evaluation and management of acute kidney injury superimposed on CKD in the setting of a dCHF exacerbation.  Was recently admitted 11/5-11/6 for dCHF exacerbation.  Had not been compliant with fluid restriction or diet but has been compliant with diuretics.  Discharge Cr 2.55.  Creatinine on admission yesterday 2.9, was given Lasix 60 mg IV BID with creatinine bumped to 3.49.   Normally has a CPAP but hadn't received it last night.    Looking back at the record, pt's creatinine has fluctuated according to volume status.  Cr has been as high as 5.  (and one value in the 7s).  Past Medical History:  Diagnosis Date  . Anemia   . Arthritis Dec. 2014   Gout  . Asthma   . CHF (congestive heart failure) ([place])   . COPD (chronic obstructive pulmonary disease) ([place])   . Diabetes mellitus   . Heart failure   . Hyperlipidemia   . Hypertension   . Kidney disease   . Morbid obesity ([place])   . Peripheral vascular disease ([place])   . Pinched nerve   . Sleep apnea     Past Surgical History:  Procedure Laterality Date  . Atherectomy and angioplasty  10/18/2011   left posterior tibial artery  . HAND SURGERY     right  . OVARY SURGERY     Left  . TOE AMPUTATION  Sept. 25,2012   Left 4th and 5th toes    Family History  Problem Relation Age of Onset  . Lung cancer Mother   . Clotting disorder Mother   . Heart disease Mother   . Cancer Mother     Lung  . Deep vein  thrombosis Mother   . Diabetes Mother   . Hypertension Mother   . Varicose Veins Mother   . Cancer Father   . Clotting disorder Sister   . Heart attack Sister   . Diabetes Sister   . Clotting disorder Brother   . Deep vein thrombosis Brother   . Diabetes Brother   . Heart disease Brother   . Heart disease Sister    Social History:  reports that she has never smoked. She has never used smokeless tobacco. She reports that she uses drugs, including Marijuana. She reports that she does not drink alcohol.  Allergies:  Allergies  Allergen Reactions  . Omnipaque [Iohexol] Nausea And Vomiting    Contrast Dye- Effects Kidney's    Medications Prior to Admission  Medication Sig Dispense Refill  . albuterol (PROVENTIL HFA;VENTOLIN HFA) 108 (90 BASE) MCG/ACT inhaler Inhale 1-2 puffs into the  lungs every 6 (six) hours as needed for wheezing. 1 Inhaler 0  . allopurinol (ZYLOPRIM) 300 MG tablet Take 300 mg by mouth at bedtime.    Marland Kitchen amLODipine (NORVASC) 10 MG tablet Take 10 mg by mouth daily.      Marland Kitchen aspirin 81 MG tablet Take 81 mg by mouth daily.      . calcitRIOL (ROCALTROL) 0.25 MCG capsule Take 0.25 mcg by mouth daily.    . cyclobenzaprine (FLEXERIL) 10 MG tablet Take 10 mg by mouth 3 (three) times daily as needed for muscle spasms.    . hydrALAZINE (APRESOLINE) 25 MG tablet Take 1 tablet (25 mg total) by mouth 2 (two) times daily. 60 tablet 1  . insulin aspart (NOVOLOG) 100 UNIT/ML injection Inject 20 Units into the skin 3 (three) times daily with meals.     . insulin glargine (LANTUS) 100 UNIT/ML injection Inject 60 Units into the skin at bedtime.     Marland Kitchen latanoprost (XALATAN) 0.005 % ophthalmic solution Place 1 drop into both eyes at bedtime.    . metoprolol (LOPRESSOR) 50 MG tablet Take 50 mg by mouth 2 (two) times daily.      . Omega-3 Fatty Acids (FISH OIL PO) Take 1 capsule by mouth daily.    . pantoprazole (PROTONIX) 40 MG tablet Take 1 tablet (40 mg total) by mouth daily. 30 tablet 2   . rosuvastatin (CRESTOR) 40 MG tablet Take 40 mg by mouth every evening.     . torsemide (DEMADEX) 20 MG tablet Take 2 tablets (40 mg total) by mouth 2 (two) times daily. 60 tablet 2  . VITAMIN E PO Take 1 capsule by mouth daily.      Results for orders placed or performed during the hospital encounter of 10/31/16 (from the past 48 hour(s))  Basic metabolic panel     Status: Abnormal   Collection Time: 10/31/16 10:59 AM  Result Value Ref Range   Sodium 139 135 - 145 mmol/L   Potassium 4.3 3.5 - 5.1 mmol/L   Chloride 101 101 - 111 mmol/L   CO2 27 22 - 32 mmol/L   Glucose, Bld 185 (H) 65 - 99 mg/dL   BUN 41 (H) 6 - 20 mg/dL   Creatinine, Ser 2.90 (H) 0.44 - 1.00 mg/dL   Calcium 9.5 8.9 - 10.3 mg/dL   GFR calc non Af Amer 17 (L) >60 mL/min   GFR calc Af Amer 20 (L) >60 mL/min    Comment: (NOTE) The eGFR has been calculated using the CKD EPI equation. This calculation has not been validated in all clinical situations. eGFR's persistently <60 mL/min signify possible Chronic Kidney Disease.    Anion gap 11 5 - 15  CBC     Status: Abnormal   Collection Time: 10/31/16 10:59 AM  Result Value Ref Range   WBC 12.7 (H) 4.0 - 10.5 K/uL   RBC 3.58 (L) 3.87 - 5.11 MIL/uL   Hemoglobin 8.4 (L) 12.0 - 15.0 g/dL   HCT 29.1 (L) 36.0 - 46.0 %   MCV 81.3 78.0 - 100.0 fL   MCH 23.5 (L) 26.0 - 34.0 pg   MCHC 28.9 (L) 30.0 - 36.0 g/dL   RDW 17.5 (H) 11.5 - 15.5 %   Platelets 274 150 - 400 K/uL  Brain natriuretic peptide     Status: Abnormal   Collection Time: 10/31/16 10:59 AM  Result Value Ref Range   B Natriuretic Peptide 108.4 (H) 0.0 - 100.0 pg/mL  I-stat troponin, ED  Status: None   Collection Time: 10/31/16 11:09 AM  Result Value Ref Range   Troponin i, poc 0.00 0.00 - 0.08 ng/mL   Comment 3            Comment: Due to the release kinetics of cTnI, a negative result within the first hours of the onset of symptoms does not rule out myocardial infarction with certainty. If  myocardial infarction is still suspected, repeat the test at appropriate intervals.   Uric acid     Status: Abnormal   Collection Time: 10/31/16  4:10 PM  Result Value Ref Range   Uric Acid, Serum 9.3 (H) 2.3 - 6.6 mg/dL  Glucose, capillary     Status: Abnormal   Collection Time: 10/31/16  5:09 PM  Result Value Ref Range   Glucose-Capillary 182 (H) 65 - 99 mg/dL  Urinalysis, Routine w reflex microscopic (not at [facility])     Status: Abnormal   Collection Time: 10/31/16  6:44 PM  Result Value Ref Range   Color, Urine YELLOW YELLOW   APPearance HAZY (A) CLEAR   Specific Gravity, Urine 1.014 1.005 - 1.030   pH 5.0 5.0 - 8.0   Glucose, UA NEGATIVE NEGATIVE mg/dL   Hgb urine dipstick NEGATIVE NEGATIVE   Bilirubin Urine NEGATIVE NEGATIVE   Ketones, ur NEGATIVE NEGATIVE mg/dL   Protein, ur 100 (A) NEGATIVE mg/dL   Nitrite NEGATIVE NEGATIVE   Leukocytes, UA NEGATIVE NEGATIVE  Urine microscopic-add on     Status: Abnormal   Collection Time: 10/31/16  6:44 PM  Result Value Ref Range   Squamous Epithelial / LPF 0-5 (A) NONE SEEN   WBC, UA 0-5 0 - 5 WBC/hpf   RBC / HPF NONE SEEN 0 - 5 RBC/hpf   Bacteria, UA FEW (A) NONE SEEN   Casts HYALINE CASTS (A) NEGATIVE    Comment: GRANULAR CAST  Glucose, capillary     Status: Abnormal   Collection Time: 10/31/16  8:50 PM  Result Value Ref Range   Glucose-Capillary 221 (H) 65 - 99 mg/dL   Comment 1 Notify RN    Comment 2 Document in Chart   CBC with Differential/Platelet     Status: Abnormal   Collection Time: 11/01/16  4:44 AM  Result Value Ref Range   WBC 10.0 4.0 - 10.5 K/uL   RBC 3.26 (L) 3.87 - 5.11 MIL/uL   Hemoglobin 7.5 (L) 12.0 - 15.0 g/dL   HCT 26.7 (L) 36.0 - 46.0 %   MCV 81.9 78.0 - 100.0 fL   MCH 23.0 (L) 26.0 - 34.0 pg   MCHC 28.1 (L) 30.0 - 36.0 g/dL   RDW 17.2 (H) 11.5 - 15.5 %   Platelets 273 150 - 400 K/uL   Neutrophils Relative % 76 %   Neutro Abs 7.6 1.7 - 7.7 K/uL   Lymphocytes Relative 15 %   Lymphs Abs 1.6 0.7 - 4.0  K/uL   Monocytes Relative 8 %   Monocytes Absolute 0.8 0.1 - 1.0 K/uL   Eosinophils Relative 1 %   Eosinophils Absolute 0.1 0.0 - 0.7 K/uL   Basophils Relative 0 %   Basophils Absolute 0.0 0.0 - 0.1 K/uL  Comprehensive metabolic panel     Status: Abnormal   Collection Time: 11/01/16  4:44 AM  Result Value Ref Range   Sodium 139 135 - 145 mmol/L   Potassium 4.5 3.5 - 5.1 mmol/L   Chloride 102 101 - 111 mmol/L   CO2 30 22 - 32 mmol/L  Glucose, Bld 177 (H) 65 - 99 mg/dL   BUN 50 (H) 6 - 20 mg/dL   Creatinine, Ser 3.49 (H) 0.44 - 1.00 mg/dL   Calcium 9.5 8.9 - 10.3 mg/dL   Total Protein 6.7 6.5 - 8.1 g/dL   Albumin 2.5 (L) 3.5 - 5.0 g/dL   AST 14 (L) 15 - 41 U/L   ALT 11 (L) 14 - 54 U/L   Alkaline Phosphatase 60 38 - 126 U/L   Total Bilirubin 0.5 0.3 - 1.2 mg/dL   GFR calc non Af Amer 14 (L) >60 mL/min   GFR calc Af Amer 16 (L) >60 mL/min    Comment: (NOTE) The eGFR has been calculated using the CKD EPI equation. This calculation has not been validated in all clinical situations. eGFR's persistently <60 mL/min signify possible Chronic Kidney Disease.    Anion gap 7 5 - 15  Glucose, capillary     Status: Abnormal   Collection Time: 11/01/16  6:27 AM  Result Value Ref Range   Glucose-Capillary 170 (H) 65 - 99 mg/dL  Glucose, capillary     Status: Abnormal   Collection Time: 11/01/16 11:33 AM  Result Value Ref Range   Glucose-Capillary 181 (H) 65 - 99 mg/dL  Protein / creatinine ratio, urine     Status: Abnormal   Collection Time: 11/01/16 11:37 AM  Result Value Ref Range   Creatinine, Urine 111.35 mg/dL   Total Protein, Urine 111 mg/dL    Comment: NO NORMAL RANGE ESTABLISHED FOR THIS TEST   Protein Creatinine Ratio 1.00 (H) 0.00 - 0.15 mg/mg[Cre]   Dg Chest 2 View  Result Date: 11/01/2016 CLINICAL DATA:  Shortness of breath. EXAM: CHEST  2 VIEW COMPARISON:  Radiograph of October 31, 2016. FINDINGS: Stable cardiomediastinal silhouette. No pneumothorax or significant  pleural effusion is noted. Mild central pulmonary vascular congestion is noted with probable mild bilateral perihilar edema. Bony thorax is unremarkable. IMPRESSION: Stable mild central pulmonary vascular congestion with probable mild bilateral perihilar edema. Electronically Signed   By: Marijo Conception, M.D.   On: 11/01/2016 07:37   Dg Chest Portable 1 View  Result Date: 10/31/2016 CLINICAL DATA:  55 year old female with chest pain upon waking. Bilateral leg pain. Subsequent encounter. EXAM: PORTABLE CHEST 1 VIEW COMPARISON:  10/23/2016 and 07/19/2015. FINDINGS: Limited portable exam with chin overlying the lung apex and poor inspiration. Pulmonary vascular congestion/ pulmonary edema. Heart size top-normal. Remaining exam is limited. IMPRESSION: Limited portable exam with chin overlying the lung apex and poor inspiration. Pulmonary vascular congestion/ pulmonary edema. Electronically Signed   By: Genia Del M.D.   On: 10/31/2016 10:44    ROS - all other systems reviewed and are negative as per HPI Blood pressure (!) 128/57, pulse 70, temperature 98.9 F (37.2 C), temperature source Oral, resp. rate 20, height _0  (1.6 m), weight (!) 151.5 kg (334 lb 1.6 oz), SpO2 98 %. Physical Exam  GEN: lying in bed, sleeping but easily arousable HEENT: EOMI, PERRL, MMM NECK thick neck, unable to appreciate JVD PULM: increased WOB, distant breath sounds but no overt wheezes CV distant breath sounds, RRR ABD: obese EXT R> L LE edema, per pt much improved from prior NEURO: AAO x 3  Assessment/Plan Pt is a 9F with a PMH significant for HTN, HLD, OSA on CPAP, CKD GIV A3, and COPD who is seen for evaluation of acute kidney injury superimposed on CKD.  1.  AKI on CKD GIV A3: likely due to cardiorenal syndrome.  Still making urine but unclear how much as it is not being recorded.  Hopefully we can avoid dialysis during this admission.   - strict I and O - diuresis as below - avoiding NSAIDS, IV  contrast, fleets enemas, mag citrate  2.  Diastolic CHF exacerbation: Lasix 60 mg IV BID.   - 1200 mL fluid restriction - may need to add metolazone if UOP not robust  3. OSA: on CPAP, didn't get it last night, will likely help with diuresis  4.  Bone/ mineral.  On calcitriol 0.25 mcg daily and ergo monthly.  Adding on PTH and phos  5.  Anemia: Hgb 7.5.  Adding on iron stores.  May need aranesp.    Madelon Lips, MD [facility] [phone] pgr [phone] 11/01/2016, 3:11 PM

## 2016-11-01 NOTE — Progress Notes (Signed)
PROGRESS NOTE  Catherine Williams XBM:[id number] DOB: 25-Jan-1961 DOA: 10/31/2016 PCP: Philis Fendt, MD  Brief History:  55 year old female with a history of diastolic CHF, COPD, diabetes mellitus, hyperlipidemia, hypertension, morbid obesity presented with one-day history of worsening shortness of breath. The patient was recently discharged from the hospital after a stay from November 5 are 10/24/2016.  The patient was discharged home on torsemide 60 mg a.m., 40 mg p.m.Marland Kitchen  She endorsed compliance with her medications, but has been noncompliant with her fluid restriction. She states that she has been drinking at least 10 x 8 oz cups per day. In the past 1-2 months, the patient also states that she has had increasing orthopnea from 2 pillows to 4 pillows. She denies any fevers, chest, nausea, vomiting, diarrhea, abdominal pain. Upon presentation, the patient was noted to have serum creatinine 2.90, CBC 12.7, chest x-ray with bilateral pulmonary edema and bilateral pleural effusions. The patient was started on intravenous furosemide.  Assessment/Plan: Acute on chronic respiratory failure with hypoxia -Multifactorial including CHF exacerbation, COPD, OHS/OSA -Main driving component of CHF exacerbation -Presently stable at 2 L nasal cannula -wean oxygen for sat >92 %  Acute on chronic diastolic CHF -Increase intravenous furosemide 60 mg IV twice a day -Daily weights -Fluid restrict -Remains fluid overloaded clinically -Echocardiogram  Acute on chronic renal failure--CKD stage IV -Baseline creatinine 2.5-2.8 -Consulted nephrology as I expect the patient's renal function will continue to worsen with continued diuresis -urine protein creatinine ratio  Diabetes mellitus type 2 with renal consultations -10/23/2016 hemoglobin A1c 8.0 -Continue reduced dose Lantus -NovoLog sliding scale  Hypertension -Continue metoprolol tartrate, hydralazine, amlodipine  LE edema -Venous  duplex -Urine protein/creatinine ratio  Leukocytosis -suspect viral gastroenteritis with a degree of stress demargination -she has felt "tired" with N/V prior to admission -WBC improved without antibiotics -monitor clinically  Anemia of CKD -Baseline creatinine 8-9 -FOBT  Hyperlipidemia -continue statin   Disposition Plan:   Home in 2-3 days  Family Communication:  No Family at bedside--Total time spent 35 minutes.  Greater than 50% spent face to face counseling and coordinating care.   Consultants:  nephrology  Code Status:  FULL  DVT Prophylaxis:  [place] Heparin   Procedures: As Listed in Progress Note Above  Antibiotics: None    Subjective: Patient is breathing better but still has dyspnea on exertion. Denies any fevers, chills, chest pain, shortness of breath at rest, pulse, vomiting or diarrhea, abdominal pain. No dysuria or hematuria. No rashes.  Objective: Vitals:   10/31/16 1707 10/31/16 2100 11/01/16 0043 11/01/16 0500  BP: (!) 152/61 (!) 103/40 (!) 124/46 (!) 131/52  Pulse: 80 73 71 72  Resp: (!) 32 (!) 22 (!) 24 (!) 24  Temp: 100.1 F (37.8 C) 99.1 F (37.3 C) 98.9 F (37.2 C) 99 F (37.2 C)  TempSrc: Oral Oral Oral Oral  SpO2: 100% 99% 96% 99%  Weight:    (!) 151.5 kg (334 lb 1.6 oz)  Height:        Intake/Output Summary (Last 24 hours) at 11/01/16 0952 Last data filed at 11/01/16 0933  Gross per 24 hour  Intake              246 ml  Output              400 ml  Net             -154 ml   Weight change:  Exam:   General:  Pt is alert, follows commands appropriately, not in acute distress  HEENT: No icterus, No thrush, No neck mass, [place]/AT  Cardiovascular: RRR, S1/S2, no rubs, no gallops  Respiratory: bibasilar crackles, no wheeze  Abdomen: Soft/+BS, non tender, non distended, no guarding  Extremities: 1 + LE edema, No lymphangitis, No petechiae, No rashes, no synovitis   Data Reviewed: I have personally reviewed following labs and  imaging studies Basic Metabolic Panel:  Recent Labs Lab 10/31/16 1059 11/01/16 0444  NA 139 139  K 4.3 4.5  CL 101 102  CO2 27 30  GLUCOSE 185* 177*  BUN 41* 50*  CREATININE 2.90* 3.49*  CALCIUM 9.5 9.5   Liver Function Tests:  Recent Labs Lab 11/01/16 0444  AST 14*  ALT 11*  ALKPHOS 60  BILITOT 0.5  PROT 6.7  ALBUMIN 2.5*   No results for input(s): LIPASE, AMYLASE in the last 168 hours. No results for input(s): AMMONIA in the last 168 hours. Coagulation Profile: No results for input(s): INR, PROTIME in the last 168 hours. CBC:  Recent Labs Lab 10/31/16 1059 11/01/16 0444  WBC 12.7* 10.0  NEUTROABS  --  7.6  HGB 8.4* 7.5*  HCT 29.1* 26.7*  MCV 81.3 81.9  PLT 274 273   Cardiac Enzymes: No results for input(s): CKTOTAL, CKMB, CKMBINDEX, TROPONINI in the last 168 hours. BNP: Invalid input(s): POCBNP CBG:  Recent Labs Lab 10/31/16 1709 10/31/16 2050 11/01/16 0627  GLUCAP 182* 221* 170*   HbA1C: No results for input(s): HGBA1C in the last 72 hours. Urine analysis:    Component Value Date/Time   COLORURINE YELLOW 10/31/2016 1844   APPEARANCEUR HAZY (A) 10/31/2016 1844   LABSPEC 1.014 10/31/2016 1844   PHURINE 5.0 10/31/2016 1844   GLUCOSEU NEGATIVE 10/31/2016 1844   HGBUR NEGATIVE 10/31/2016 1844   BILIRUBINUR NEGATIVE 10/31/2016 1844   KETONESUR NEGATIVE 10/31/2016 1844   PROTEINUR 100 (A) 10/31/2016 1844   UROBILINOGEN 0.2 07/19/2015 1939   NITRITE NEGATIVE 10/31/2016 1844   LEUKOCYTESUR NEGATIVE 10/31/2016 1844   Sepsis Labs: @LABRCNTIP (procalcitonin:4,lacticidven:4) )No results found for this or any previous visit (from the past 240 hour(s)).   Scheduled Meds: . allopurinol  300 mg Oral QHS  . amLODipine  10 mg Oral Daily  . aspirin EC  81 mg Oral Daily  . furosemide  40 mg Intravenous BID  . heparin  5,000 Units Subcutaneous Q8H  . hydrALAZINE  25 mg Oral BID  . insulin aspart  0-9 Units Subcutaneous TID WC  . insulin glargine  30  Units Subcutaneous QHS  . metoprolol  50 mg Oral BID  . pantoprazole  40 mg Oral Daily  . rosuvastatin  40 mg Oral QPM  . sodium chloride flush  3 mL Intravenous Q12H   Continuous Infusions:  Procedures/Studies: Dg Chest 2 View  Result Date: 11/01/2016 CLINICAL DATA:  Shortness of breath. EXAM: CHEST  2 VIEW COMPARISON:  Radiograph of October 31, 2016. FINDINGS: Stable cardiomediastinal silhouette. No pneumothorax or significant pleural effusion is noted. Mild central pulmonary vascular congestion is noted with probable mild bilateral perihilar edema. Bony thorax is unremarkable. IMPRESSION: Stable mild central pulmonary vascular congestion with probable mild bilateral perihilar edema. Electronically Signed   By: Marijo Conception, M.D.   On: 11/01/2016 07:37   Dg Chest 2 View  Result Date: 10/23/2016 CLINICAL DATA:  Tachycardia EXAM: CHEST  2 VIEW COMPARISON:  07/19/2015 chest radiograph. FINDINGS: Stable cardiomediastinal silhouette with mild cardiomegaly. No pneumothorax. No pleural  effusion. Mild pulmonary edema. No acute consolidative airspace disease. IMPRESSION: Mild congestive heart failure. Electronically Signed   By: Ilona Sorrel M.D.   On: 10/23/2016 12:26   Dg Chest Portable 1 View  Result Date: 10/31/2016 CLINICAL DATA:  55 year old female with chest pain upon waking. Bilateral leg pain. Subsequent encounter. EXAM: PORTABLE CHEST 1 VIEW COMPARISON:  10/23/2016 and 07/19/2015. FINDINGS: Limited portable exam with chin overlying the lung apex and poor inspiration. Pulmonary vascular congestion/ pulmonary edema. Heart size top-normal. Remaining exam is limited. IMPRESSION: Limited portable exam with chin overlying the lung apex and poor inspiration. Pulmonary vascular congestion/ pulmonary edema. Electronically Signed   By: Genia Del M.D.   On: 10/31/2016 10:44    Ulric Salzman, DO  [facility] Pager [phone]  If 7PM-7AM, please contact  night-coverage [email or web address] Password [email or web address] 11/01/2016, 9:52 AM   LOS: 0 days

## 2016-11-02 DIAGNOSIS — I10 Essential (primary) hypertension: Secondary | ICD-10-CM

## 2016-11-02 LAB — RENAL FUNCTION PANEL
Albumin: 2.7 g/dL — ABNORMAL LOW (ref 3.5–5.0)
Anion gap: 11 (ref 5–15)
BUN: 58 mg/dL — ABNORMAL HIGH (ref 6–20)
CHLORIDE: 101 mmol/L (ref 101–111)
CO2: 26 mmol/L (ref 22–32)
Calcium: 9.8 mg/dL (ref 8.9–10.3)
Creatinine, Ser: 3.48 mg/dL — ABNORMAL HIGH (ref 0.44–1.00)
GFR, EST AFRICAN AMERICAN: 16 mL/min — AB (ref >60)
GFR, EST NON AFRICAN AMERICAN: 14 mL/min — AB (ref >60)
Glucose, Bld: 147 mg/dL — ABNORMAL HIGH (ref 65–99)
POTASSIUM: 4.2 mmol/L (ref 3.5–5.1)
Phosphorus: 3.5 mg/dL (ref 2.5–4.6)
Sodium: 138 mmol/L (ref 135–145)

## 2016-11-02 LAB — CBC
HEMATOCRIT: 25.8 % — AB (ref 36.0–46.0)
HEMOGLOBIN: 7.6 g/dL — AB (ref 12.0–15.0)
MCH: 23.7 pg — ABNORMAL LOW (ref 26.0–34.0)
MCHC: 29.5 g/dL — ABNORMAL LOW (ref 30.0–36.0)
MCV: 80.4 fL (ref 78.0–100.0)
Platelets: 310 10*3/uL (ref 150–400)
RBC: 3.21 MIL/uL — AB (ref 3.87–5.11)
RDW: 17.1 % — ABNORMAL HIGH (ref 11.5–15.5)
WBC: 9.8 10*3/uL (ref 4.0–10.5)

## 2016-11-02 LAB — GLUCOSE, CAPILLARY
GLUCOSE-CAPILLARY: 140 mg/dL — AB (ref 65–99)
GLUCOSE-CAPILLARY: 151 mg/dL — AB (ref 65–99)
GLUCOSE-CAPILLARY: 190 mg/dL — AB (ref 65–99)
Glucose-Capillary: 149 mg/dL — ABNORMAL HIGH (ref 65–99)

## 2016-11-02 LAB — PARATHYROID HORMONE, INTACT (NO CA): PTH: 211 pg/mL — ABNORMAL HIGH (ref 15–65)

## 2016-11-02 MED ORDER — FERROUS SULFATE 325 (65 FE) MG PO TABS
325.0000 mg | ORAL_TABLET | Freq: Two times a day (BID) | ORAL | Status: DC
  Administered 2016-11-02 – 2016-11-03 (×2): 325 mg via ORAL
  Filled 2016-11-02 (×2): qty 1

## 2016-11-02 MED ORDER — SODIUM CHLORIDE 0.9 % IV SOLN
510.0000 mg | Freq: Once | INTRAVENOUS | Status: AC
  Administered 2016-11-02: 510 mg via INTRAVENOUS
  Filled 2016-11-02: qty 17

## 2016-11-02 MED ORDER — SODIUM CHLORIDE 0.9 % IV SOLN
125.0000 mg | Freq: Once | INTRAVENOUS | Status: DC
  Filled 2016-11-02: qty 10

## 2016-11-02 MED ORDER — METOLAZONE 2.5 MG PO TABS
2.5000 mg | ORAL_TABLET | Freq: Two times a day (BID) | ORAL | Status: DC
  Administered 2016-11-02 – 2016-11-03 (×3): 2.5 mg via ORAL
  Filled 2016-11-02 (×3): qty 1

## 2016-11-02 NOTE — Progress Notes (Signed)
[facility] Progress Note    Assessment/ Plan:   Pt is a 40F with a PMH significant for HTN, HLD, OSA on CPAP, CKD GIV A3, and COPD who is seen for evaluation of acute kidney injury superimposed on CKD.  1.  AKI on CKD GIV A3: likely due to cardiorenal syndrome.  Still making urine but unclear how much as it is not being recorded.  Hopefully we can avoid dialysis during this admission.   - strict I and O - diuresis as below - avoiding NSAIDS, IV contrast, fleets enemas, mag citrate - fortunately cr is stable today  2.  Diastolic CHF exacerbation: Lasix 60 mg IV BID.   - 1200 mL fluid restriction - metolazone 2.5 mg BID added  3. OSA: on CPAP, wore it last night  4.  Bone/ mineral.  On calcitriol 0.25 mcg daily and ergo monthly.  PTH 211, phos 3.5.  5.  Anemia: Hgb 7.5.  iron deficient with % sat 6.  Ferraheme x 2.  Subjective:    Feeling better, less SOB.  Good urine output but weight minimally changed.     Objective:   BP (!) 122/51 (BP Location: Left Arm)   Pulse 73   Temp 98.3 F (36.8 C) (Oral)   Resp 18   Ht 5\' 3"  (1.6 m)   Wt (!) 151.3 kg (333 lb 8 oz)   SpO2 99%   BMI 59.08 kg/m   Intake/Output Summary (Last 24 hours) at 11/02/16 1543 Last data filed at 11/02/16 1015  Gross per 24 hour  Intake              243 ml  Output             1350 ml  Net            -1107 ml   Weight change: -1.134 kg (-2 lb 8 oz)  Physical Exam: GEN: standing up today, feeling well HEENT: EOMI, PERRL, MMM NECK thick neck, unable to appreciate JVD PULM: increased WOB, distant breath sounds  CV distant breath sounds, RRR ABD: obese EXT R> L LE edema, unchanged NEURO: AAO x 3  Imaging: Dg Chest 2 View  Result Date: 11/01/2016 CLINICAL DATA:  Shortness of breath. EXAM: CHEST  2 VIEW COMPARISON:  Radiograph of October 31, 2016. FINDINGS: Stable cardiomediastinal silhouette. No pneumothorax or significant pleural effusion is noted. Mild central pulmonary  vascular congestion is noted with probable mild bilateral perihilar edema. Bony thorax is unremarkable. IMPRESSION: Stable mild central pulmonary vascular congestion with probable mild bilateral perihilar edema. Electronically Signed   By: Marijo Conception, M.D.   On: 11/01/2016 07:37    Labs: BMET  Recent Labs Lab 10/31/16 1059 11/01/16 0444 11/01/16 1626 11/02/16 1224  NA 139 139  --  138  K 4.3 4.5  --  4.2  CL 101 102  --  101  CO2 27 30  --  26  GLUCOSE 185* 177*  --  147*  BUN 41* 50*  --  58*  CREATININE 2.90* 3.49*  --  3.48*  CALCIUM 9.5 9.5  --  9.8  PHOS  --   --  4.0 3.5   CBC  Recent Labs Lab 10/31/16 1059 11/01/16 0444 11/02/16 1224  WBC 12.7* 10.0 9.8  NEUTROABS  --  7.6  --   HGB 8.4* 7.5* 7.6*  HCT 29.1* 26.7* 25.8*  MCV 81.3 81.9 80.4  PLT 274 273 310    Medications:    .  allopurinol  300 mg Oral QHS  . aspirin EC  81 mg Oral Daily  . ferric gluconate (FERRLECIT/NULECIT) IV  125 mg Intravenous Once  . ferrous sulfate  325 mg Oral BID WC  . furosemide  60 mg Intravenous BID  . heparin  5,000 Units Subcutaneous Q8H  . hydrALAZINE  25 mg Oral BID  . insulin aspart  0-9 Units Subcutaneous TID WC  . insulin glargine  30 Units Subcutaneous QHS  . metolazone  2.5 mg Oral BID  . metoprolol  50 mg Oral BID  . pantoprazole  40 mg Oral Daily  . rosuvastatin  40 mg Oral QPM  . sodium chloride flush  3 mL Intravenous Q12H      Madelon Lips, MD [facility] Cell [phone] Pgr: [phone] 11/02/2016, 3:43 PM

## 2016-11-02 NOTE — Progress Notes (Signed)
PROGRESS NOTE [facility]   Catherine Williams   WUJ:[id number] DOB: Apr 10, 1961  DOA: 10/31/2016 PCP: Philis Fendt, MD   Brief Narrative: 55 year old female with a history of diastolic CHF, COPD, diabetes mellitus, hyperlipidemia, hypertension, morbid obesity presented with one-day history of worsening shortness of breath. The patient was recently discharged from the hospital after a stay from November 5 are 10/24/2016.  The patient was discharged home on torsemide 60 mg a.m., 40 mg p.m.Marland Kitchen  She endorsed compliance with her medications, but has been noncompliant with her fluid restriction. She states that she has been drinking at least 10 x 8 oz cups per day. In the past 1-2 months, the patient also states that she has had increasing orthopnea from 2 pillows to 4 pillows. She denies any fevers, chest, nausea, vomiting, diarrhea, abdominal pain. Upon presentation, the patient was noted to have serum creatinine 2.90, CBC 12.7, chest x-ray with bilateral pulmonary edema and bilateral pleural effusions. The patient was started on intravenous furosemide.  Subjective: Patient seen and examined today. Feeling much better, no chest pain no shortness of breath. Diuresis has been minimal as well as weight loss. The patient clinically doing better.  Assessment & Plan: Acute on chronic respiratory failure with hypoxia - improving patient stable 2 liters nasal cannula Multifactorial including CHF exacerbation and COPD Wean oxygen as tolerated keep O2 sat above 91%  Acute on chronic diastolic CHF minimal diuresis, only 2 pounds down. Continue IV Lasix 60 mg twice a day Add metolazone 2.5 mg twice a day Strict I&O's Daily weights Fluid restriction Echo pending  Acute on chronic renal failure - CKD stage IV - creatinine stable, but above baseline (2.5-2.8) Nephrology recommendations appreciated Monitor BMP  Diabetes mellitus type 2 - stable Last A1c 8.0 Continue Lantus SSI Monitor  CBGs  Hypertension Continue home medications: Metoprolol hydralazine and amlodipine Consider d/c amlodipine as blood pressure is soft, will help to decrease peripheral edema Monitor blood pressure  Anemia of chronic disease - patient hemodynamically stable no signs of gross bleeding. We'll give 1 dose of Venofer, start iron supplementation  DVT prophylaxis: Heparin Code Status: Full Family Communication: None at bedside Disposition Plan: If clinically improving d/c back to SNF in 24-48 hours   Consultants:   Nephrology  Procedures:   Echo pending  Antimicrobials:  None   Objective: Vitals:   11/01/16 2301 11/02/16 0638 11/02/16 1200 11/02/16 1211  BP:  (!) 152/55    Pulse: 80 73    Resp: 18 20    Temp:  98.3 F (36.8 C)    TempSrc:  Oral    SpO2: 94% 93% (!) 81% 93%  Weight:  (!) 151.3 kg (333 lb 8 oz)    Height:        Intake/Output Summary (Last 24 hours) at 11/02/16 1442 Last data filed at 11/02/16 1015  Gross per 24 hour  Intake              243 ml  Output             1350 ml  Net            -1107 ml   Filed Weights   10/31/16 1019 11/01/16 0500 11/02/16 9562  Weight: (!) 152.4 kg (336 lb) (!) 151.5 kg (334 lb 1.6 oz) (!) 151.3 kg (333 lb 8 oz)    Examination:  General exam: Appears calm and comfortable Respiratory system: Difficult to assess due to obesity, air entry is good mild crackles  at the bases Cardiovascular system: Heart sounds are distant but present, no JVD, no murmurs heard Gastrointestinal system: Abdomen is obese, nondistended, soft and nontender. Normal bowel sounds heard. Central nervous system: Alert and oriented. No focal neurological deficits. Extremities: 1+ pitting pedal edema.  Skin: No rashes, lesions or ulcers  Psychiatry: Judgement and insight appear normal. Mood & affect appropriate.    Data Reviewed: I have personally reviewed following labs and imaging studies  CBC:  Recent Labs Lab 10/31/16 1059 11/01/16 0444  11/02/16 1224  WBC 12.7* 10.0 9.8  NEUTROABS  --  7.6  --   HGB 8.4* 7.5* 7.6*  HCT 29.1* 26.7* 25.8*  MCV 81.3 81.9 80.4  PLT 274 273 008   Basic Metabolic Panel:  Recent Labs Lab 10/31/16 1059 11/01/16 0444 11/01/16 1626 11/02/16 1224  NA 139 139  --  138  K 4.3 4.5  --  4.2  CL 101 102  --  101  CO2 27 30  --  26  GLUCOSE 185* 177*  --  147*  BUN 41* 50*  --  58*  CREATININE 2.90* 3.49*  --  3.48*  CALCIUM 9.5 9.5  --  9.8  PHOS  --   --  4.0 3.5   GFR: Estimated Creatinine Clearance: 25.9 mL/min (by C-G formula based on SCr of 3.48 mg/dL (H)). Liver Function Tests:  Recent Labs Lab 11/01/16 0444 11/02/16 1224  AST 14*  --   ALT 11*  --   ALKPHOS 60  --   BILITOT 0.5  --   PROT 6.7  --   ALBUMIN 2.5* 2.7*   No results for input(s): LIPASE, AMYLASE in the last 168 hours. No results for input(s): AMMONIA in the last 168 hours. Coagulation Profile: No results for input(s): INR, PROTIME in the last 168 hours. Cardiac Enzymes: No results for input(s): CKTOTAL, CKMB, CKMBINDEX, TROPONINI in the last 168 hours. BNP (last 3 results) No results for input(s): PROBNP in the last 8760 hours. HbA1C: No results for input(s): HGBA1C in the last 72 hours. CBG:  Recent Labs Lab 11/01/16 1133 11/01/16 1651 11/01/16 2114 11/02/16 0728 11/02/16 1201  GLUCAP 181* 212* 197* 149* 140*   Lipid Profile: No results for input(s): CHOL, HDL, LDLCALC, TRIG, CHOLHDL, LDLDIRECT in the last 72 hours. Thyroid Function Tests: No results for input(s): TSH, T4TOTAL, FREET4, T3FREE, THYROIDAB in the last 72 hours. Anemia Panel:  Recent Labs  11/01/16 1626  FERRITIN 632*  TIBC 196*  IRON 10*   Sepsis Labs: No results for input(s): PROCALCITON, LATICACIDVEN in the last 168 hours.  No results found for this or any previous visit (from the past 240 hour(s)).       Radiology Studies: Dg Chest 2 View  Result Date: 11/01/2016 CLINICAL DATA:  Shortness of breath. EXAM:  CHEST  2 VIEW COMPARISON:  Radiograph of October 31, 2016. FINDINGS: Stable cardiomediastinal silhouette. No pneumothorax or significant pleural effusion is noted. Mild central pulmonary vascular congestion is noted with probable mild bilateral perihilar edema. Bony thorax is unremarkable. IMPRESSION: Stable mild central pulmonary vascular congestion with probable mild bilateral perihilar edema. Electronically Signed   By: Marijo Conception, M.D.   On: 11/01/2016 07:37      Scheduled Meds: . allopurinol  300 mg Oral QHS  . amLODipine  10 mg Oral Daily  . aspirin EC  81 mg Oral Daily  . furosemide  60 mg Intravenous BID  . heparin  5,000 Units Subcutaneous Q8H  . hydrALAZINE  25 mg Oral BID  . insulin aspart  0-9 Units Subcutaneous TID WC  . insulin glargine  30 Units Subcutaneous QHS  . metolazone  2.5 mg Oral BID  . metoprolol  50 mg Oral BID  . pantoprazole  40 mg Oral Daily  . rosuvastatin  40 mg Oral QPM  . sodium chloride flush  3 mL Intravenous Q12H   Continuous Infusions:   LOS: 1 day     Chipper Oman, MD [facility] Pager [phone]  If 7PM-7AM, please contact night-coverage [email or web address] Password [email or web address] 11/02/2016, 2:42 PM

## 2016-11-03 ENCOUNTER — Inpatient Hospital Stay (HOSPITAL_COMMUNITY): Payer: Medicare Other

## 2016-11-03 DIAGNOSIS — N183 Chronic kidney disease, stage 3 (moderate): Secondary | ICD-10-CM

## 2016-11-03 DIAGNOSIS — I509 Heart failure, unspecified: Secondary | ICD-10-CM

## 2016-11-03 DIAGNOSIS — E119 Type 2 diabetes mellitus without complications: Secondary | ICD-10-CM

## 2016-11-03 LAB — GLUCOSE, CAPILLARY
Glucose-Capillary: 142 mg/dL — ABNORMAL HIGH (ref 65–99)
Glucose-Capillary: 154 mg/dL — ABNORMAL HIGH (ref 65–99)

## 2016-11-03 LAB — MAGNESIUM: MAGNESIUM: 2.2 mg/dL (ref 1.7–2.4)

## 2016-11-03 LAB — BASIC METABOLIC PANEL
ANION GAP: 10 (ref 5–15)
BUN: 60 mg/dL — ABNORMAL HIGH (ref 6–20)
CALCIUM: 10 mg/dL (ref 8.9–10.3)
CO2: 29 mmol/L (ref 22–32)
Chloride: 100 mmol/L — ABNORMAL LOW (ref 101–111)
Creatinine, Ser: 3.59 mg/dL — ABNORMAL HIGH (ref 0.44–1.00)
GFR, EST AFRICAN AMERICAN: 15 mL/min — AB (ref >60)
GFR, EST NON AFRICAN AMERICAN: 13 mL/min — AB (ref >60)
GLUCOSE: 154 mg/dL — AB (ref 65–99)
POTASSIUM: 4.2 mmol/L (ref 3.5–5.1)
SODIUM: 139 mmol/L (ref 135–145)

## 2016-11-03 LAB — ECHOCARDIOGRAM COMPLETE
Height: 63 in
Weight: 5299.2 oz

## 2016-11-03 MED ORDER — METOLAZONE 2.5 MG PO TABS: 2.5000 mg | ORAL_TABLET | Freq: Two times a day (BID) | ORAL | 0 refills | Status: DC

## 2016-11-03 MED ORDER — FUROSEMIDE 80 MG PO TABS: 80.0000 mg | ORAL_TABLET | Freq: Two times a day (BID) | ORAL | 0 refills | Status: DC

## 2016-11-03 MED ORDER — DARBEPOETIN ALFA 60 MCG/0.3ML IJ SOSY
60.0000 ug | PREFILLED_SYRINGE | INTRAMUSCULAR | Status: DC
  Administered 2016-11-03: 60 ug via SUBCUTANEOUS
  Filled 2016-11-03: qty 0.3

## 2016-11-03 MED ORDER — PERFLUTREN LIPID MICROSPHERE
1.0000 mL | INTRAVENOUS | Status: AC | PRN
  Administered 2016-11-03: 3 mL via INTRAVENOUS
  Filled 2016-11-03: qty 10

## 2016-11-03 MED ORDER — ALLOPURINOL 100 MG PO TABS: 100.0000 mg | ORAL_TABLET | Freq: Every day | ORAL | 0 refills | Status: DC

## 2016-11-03 MED ORDER — DARBEPOETIN ALFA 60 MCG/0.3ML IJ SOSY: 60.0000 ug | PREFILLED_SYRINGE | INTRAMUSCULAR | 0 refills | Status: DC

## 2016-11-03 MED ORDER — FERROUS SULFATE 325 (65 FE) MG PO TABS: 325.0000 mg | ORAL_TABLET | Freq: Two times a day (BID) | ORAL | 0 refills | Status: DC

## 2016-11-03 NOTE — Progress Notes (Signed)
Bedside report received from off going nurse. Patient in bed resting family visiting with patient. No needs voiced.Will continue to monitor. Hermina Barters RN 11/03/2016 5:16 AM

## 2016-11-03 NOTE — Progress Notes (Signed)
[facility] Progress Note    Assessment/ Plan:   Pt is a 15F with a PMH significant for HTN, HLD, OSA on CPAP, CKD GIV A3, and COPD who is seen for evaluation of acute kidney injury superimposed on CKD.  1.  AKI on CKD GIV A3: likely due to cardiorenal syndrome.  Hopefully we can avoid dialysis during this admission.  Achieving diuresis with metolazone and lasix. - strict I and O - diuresis as below - avoiding NSAIDS, IV contrast, fleets enemas, mag citrate - fortunately cr is stable today  2.  Diastolic CHF exacerbation:  - Lasix 60 mg IV BID.   - metolazone 2.5 mg BID added - 1200 mL fluid restriction  3. OSA: on CPAP, wore it last night  4.  Bone/ mineral.  On calcitriol 0.25 mcg daily and ergo monthly.  PTH 211, phos 3.5.  5.  Anemia: Hgb 7.5.  iron deficient with % sat 6.  Ferraheme x 2, received first dose 11/15.  Next dose will be due next week, may need this as an outpatient.  Giving Aranesp 60q week.  Subjective:    Feeling better, less SOB.  Weight down!  Off O2 this AM for breakfast.     Objective:   BP (!) 132/54 (BP Location: Left Wrist)   Pulse 69   Temp 98.8 F (37.1 C) (Oral)   Resp 18   Ht 5\' 3"  (1.6 m)   Wt (!) 150.2 kg (331 lb 3.2 oz)   SpO2 96%   BMI 58.67 kg/m   Intake/Output Summary (Last 24 hours) at 11/03/16 1152 Last data filed at 11/03/16 1044  Gross per 24 hour  Intake              463 ml  Output             2150 ml  Net            -1687 ml   Weight change: -1.043 kg (-2 lb 4.8 oz)  Physical Exam: GEN: standing up today, feeling well HEENT: EOMI, PERRL, MMM NECK thick neck, unable to appreciate JVD PULM: increased WOB, distant breath sounds  CV distant breath sounds, RRR ABD: obese EXT R> L LE edema, unchanged NEURO: AAO x 3  Imaging: No results found.  Labs: BMET  Recent Labs Lab 10/31/16 1059 11/01/16 0444 11/01/16 1626 11/02/16 1224 11/03/16 0400  NA 139 139  --  138 139  K 4.3 4.5  --  4.2  4.2  CL 101 102  --  101 100*  CO2 27 30  --  26 29  GLUCOSE 185* 177*  --  147* 154*  BUN 41* 50*  --  58* 60*  CREATININE 2.90* 3.49*  --  3.48* 3.59*  CALCIUM 9.5 9.5  --  9.8 10.0  PHOS  --   --  4.0 3.5  --    CBC  Recent Labs Lab 10/31/16 1059 11/01/16 0444 11/02/16 1224  WBC 12.7* 10.0 9.8  NEUTROABS  --  7.6  --   HGB 8.4* 7.5* 7.6*  HCT 29.1* 26.7* 25.8*  MCV 81.3 81.9 80.4  PLT 274 273 310    Medications:    . allopurinol  300 mg Oral QHS  . aspirin EC  81 mg Oral Daily  . ferrous sulfate  325 mg Oral BID WC  . furosemide  60 mg Intravenous BID  . heparin  5,000 Units Subcutaneous Q8H  . hydrALAZINE  25 mg Oral BID  .  insulin aspart  0-9 Units Subcutaneous TID WC  . insulin glargine  30 Units Subcutaneous QHS  . metolazone  2.5 mg Oral BID  . metoprolol  50 mg Oral BID  . pantoprazole  40 mg Oral Daily  . rosuvastatin  40 mg Oral QPM  . sodium chloride flush  3 mL Intravenous Q12H      Madelon Lips, MD [facility] Cell [phone] Pgr: [phone] 11/03/2016, 11:52 AM

## 2016-11-03 NOTE — Discharge Summary (Signed)
Physician Discharge Summary  Catherine Williams  WNU:[id number]  DOB: 06-03-1961  DOA: 10/31/2016 PCP: Philis Fendt, MD  Admit date: 10/31/2016 Discharge date: 11/03/2016  Admitted From: Home  Disposition:  Home  Recommendations for Outpatient Follow-up:  1. Follow up with PCP in 1-2 weeks 2. Please obtain BMP/CBC in one week   Home Health: None  Equipment/Devices: None  Discharge Condition: Stable  CODE STATUS: Full  Diet recommendation: Heart Healthy, Low sadium / Carb Modified, Fluid restriction to 1.2-1.5 L a day   Brief/Interim Summary: 55 year old female with a history of diastolic CHF, COPD, diabetes mellitus, hyperlipidemia, hypertension, morbid obesity presented with one-day history of worsening shortness of breath. The patient was recently discharged from the hospital after a stay from November 5are 10/24/2016. The patient was discharged home on torsemide 60 mg a.m., 40 mg p.m.Marland Kitchen She endorsed compliance with her medications, but has been noncompliant with her fluid restriction. She states that she has been drinking at least 10 x 8 oz cups per day. In the past 1-2 months, the patient also states that she has had increasing orthopnea from 2 pillows to 4 pillows. She denies any fevers, chest, nausea, vomiting, diarrhea, abdominal pain. Upon presentation, the patient was noted to have serum creatinine 2.90, CBC 12.7, chest x-ray with bilateral pulmonary edema and bilateral pleural effusions. The patient was started on intravenous furosemide. Patient clinically improve with resolution of her initial symptoms. Patient was diuresed with IV Lasix twice a day on the second day of admission patient was started on metolazone which helped the diuresis significantly. Weight was down by 5 pounds. Nephrology was consulted given CKD recommendations were to continue diuresis as renal function was worsened due to cardiorenal syndrome. Patient also shows some anemia of chronic disease IV iron was  given. Patient will likely need erythropoietin as an outpatient. Patient will be discharged home on Lasix 80 mg twice a day and metolazone. She is to follow-up with her kidney doctor next week. Nephrology arranging appointment. Patient was instructed to limit her fluid to 1.5 L a day and decrease her sodium diet. Patient verbalized understanding and seems to be committed to lifestyle modification. Patient will be discharged home today.  Subjective: Patient seen and examined today she is out of bed to chair, off oxygen having breakfast and saturating well. Patient report feeling significantly better and back to baseline. She once to go home. Discussed with nephrologist okay to discharge on Lasix and metolazone she will follow-up in one week with renal doctor. Discussed with patient the importance of diet adherence and limited fluid intake. Patient verbalized understanding.  Discharge Diagnoses:  Acute on chronic respiratory failure with hypoxia - Resolved with diuresis  Multifactorial including CHF exacerbation and COPD  Acute on chronic diastolic CHF - good diuresis after adding metolazone Will continue Lasix 80 mg by mouth twice a day Continue metolazone 2.5 mg twice a day Daily weights Fluid restriction - discussed with him different ways of how to measure fluids in the home setting ECHO was limited study, ? Normal LVF - need to follow up with Cardiology as outpatient   Acute on chronic renal failure - CKD stage IV - creatinine above baseline (2.5-2.8) due to aggressive diuresis I suspect that this will normalize as she become euvolemic. The change of IV Lasix to by mouth will help as well. Patient will follow-up in one week for her nephrologist. Monitor BMP  Diabetes mellitus type 2 - CBGs stable Last A1c 8.0 Continue Lantus 30 units  Monitor fingerstick goal <140 fasting  Hypertension - stable Continue home medications: Metoprolol and  hydralazine Amlodipine was discontinued due to  soft BP and peripheral edema. Follow-up with PMD  Anemia of chronic disease secondary to CKD, as well as iron deficient- patient hemodynamically stable no signs of gross bleeding. Patient received a dose of Venofer yesterday, next dose will be due next week as an outpatient. Nephrologist strated Aranesp 60 mg every weekly  COPD - stable Continue home medications  Gout - stable Allopurinol renally dose  Discharge Instructions  Discharge Instructions    (HEART FAILURE PATIENTS) Call MD:  Anytime you have any of the following symptoms: 1) 3 pound weight gain in 24 hours or 5 pounds in 1 week 2) shortness of breath, with or without a dry hacking cough 3) swelling in the hands, feet or stomach 4) if you have to sleep on extra pillows at night in order to breathe.    Complete by:  As directed    Avoid straining    Complete by:  As directed    Call MD for:  difficulty breathing, headache or visual disturbances    Complete by:  As directed    Call MD for:  extreme fatigue    Complete by:  As directed    Call MD for:  hives    Complete by:  As directed    Call MD for:  persistant dizziness or light-headedness    Complete by:  As directed    Call MD for:  persistant nausea and vomiting    Complete by:  As directed    Call MD for:  redness, tenderness, or signs of infection (pain, swelling, redness, odor or green/yellow discharge around incision site)    Complete by:  As directed    Call MD for:  severe uncontrolled pain    Complete by:  As directed    Call MD for:  temperature >100.4    Complete by:  As directed    Diet - low sodium heart healthy    Complete by:  As directed    Diet Carb Modified    Complete by:  As directed    Discharge instructions    Complete by:  As directed    Heart Failure patients record your daily weight using the same scale at the same time of day    Complete by:  As directed    Increase activity slowly    Complete by:  As directed    STOP any activity  that causes chest pain, shortness of breath, dizziness, sweating, or exessive weakness    Complete by:  As directed        Medication List    STOP taking these medications   amLODipine 10 MG tablet Commonly known as:  NORVASC   torsemide 20 MG tablet Commonly known as:  DEMADEX   VITAMIN E PO     TAKE these medications   albuterol 108 (90 Base) MCG/ACT inhaler Commonly known as:  PROVENTIL HFA;VENTOLIN HFA Inhale 1-2 puffs into the lungs every 6 (six) hours as needed for wheezing.   allopurinol 100 MG tablet Commonly known as:  ZYLOPRIM Take 1 tablet (100 mg total) by mouth at bedtime. What changed:  medication strength  how much to take   aspirin 81 MG tablet Take 81 mg by mouth daily.   calcitRIOL 0.25 MCG capsule Commonly known as:  ROCALTROL Take 0.25 mcg by mouth daily.   cyclobenzaprine 10 MG tablet Commonly known as:  FLEXERIL Take 10 mg by mouth 3 (three) times daily as needed for muscle spasms.   Darbepoetin Alfa 60 MCG/0.3ML Sosy injection Commonly known as:  ARANESP Inject 0.3 mLs (60 mcg total) into the skin every Thursday at 6pm.   ferrous sulfate 325 (65 FE) MG tablet Take 1 tablet (325 mg total) by mouth 2 (two) times daily with a meal.   FISH OIL PO Take 1 capsule by mouth daily.   furosemide 80 MG tablet Commonly known as:  LASIX Take 1 tablet (80 mg total) by mouth 2 (two) times daily.   hydrALAZINE 25 MG tablet Commonly known as:  APRESOLINE Take 1 tablet (25 mg total) by mouth 2 (two) times daily.   insulin aspart 100 UNIT/ML injection Commonly known as:  novoLOG Inject 20 Units into the skin 3 (three) times daily with meals.   insulin glargine 100 UNIT/ML injection Commonly known as:  LANTUS Inject 60 Units into the skin at bedtime.   latanoprost 0.005 % ophthalmic solution Commonly known as:  XALATAN Place 1 drop into both eyes at bedtime.   metolazone 2.5 MG tablet Commonly known as:  ZAROXOLYN Take 1 tablet (2.5 mg  total) by mouth 2 (two) times daily.   metoprolol 50 MG tablet Commonly known as:  LOPRESSOR Take 50 mg by mouth 2 (two) times daily.   pantoprazole 40 MG tablet Commonly known as:  PROTONIX Take 1 tablet (40 mg total) by mouth daily.   rosuvastatin 40 MG tablet Commonly known as:  CRESTOR Take 40 mg by mouth every evening.      Follow-up Information    [facility] Follow up.   Specialty:  [place] Why:  They will go to your home for home health care services Contact information: [place] [id number] [phone]        Philis Fendt, MD. Go on 11/07/2016.   Specialty:  Internal Medicine Why:  @11 :30am @ [facility] information: [place] [id number] [phone]          Allergies  Allergen Reactions  . Omnipaque [Iohexol] Nausea And Vomiting    Contrast Dye- Effects Kidney's    Consultations:  Nephrology    Procedures/Studies: Dg Chest 2 View  Result Date: 11/01/2016 CLINICAL DATA:  Shortness of breath. EXAM: CHEST  2 VIEW COMPARISON:  Radiograph of October 31, 2016. FINDINGS: Stable cardiomediastinal silhouette. No pneumothorax or significant pleural effusion is noted. Mild central pulmonary vascular congestion is noted with probable mild bilateral perihilar edema. Bony thorax is unremarkable. IMPRESSION: Stable mild central pulmonary vascular congestion with probable mild bilateral perihilar edema. Electronically Signed   By: Marijo Conception, M.D.   On: 11/01/2016 07:37   Dg Chest 2 View  Result Date: 10/23/2016 CLINICAL DATA:  Tachycardia EXAM: CHEST  2 VIEW COMPARISON:  07/19/2015 chest radiograph. FINDINGS: Stable cardiomediastinal silhouette with mild cardiomegaly. No pneumothorax. No pleural effusion. Mild pulmonary edema. No acute consolidative airspace disease. IMPRESSION: Mild congestive heart failure. Electronically Signed   By: Ilona Sorrel M.D.   On: 10/23/2016 12:26   Dg  Chest Portable 1 View  Result Date: 10/31/2016 CLINICAL DATA:  55 year old female with chest pain upon waking. Bilateral leg pain. Subsequent encounter. EXAM: PORTABLE CHEST 1 VIEW COMPARISON:  10/23/2016 and 07/19/2015. FINDINGS: Limited portable exam with chin overlying the lung apex and poor inspiration. Pulmonary vascular congestion/ pulmonary edema. Heart size top-normal. Remaining exam is limited. IMPRESSION: Limited portable exam with chin overlying  the lung apex and poor inspiration. Pulmonary vascular congestion/ pulmonary edema. Electronically Signed   By: Genia Del M.D.   On: 10/31/2016 10:44   ECHO 11/03/2016 ------------------------------------------------------------------- Study Conclusions  - Left ventricle: The cavity size was normal. - Impressions: Poor acoustic windows limit study, even with the use   of Definity.   Overall LVEF is normal ? infeior / inferolateral hypokinesis.   RV is not seen well enough to evaluate function.   Valvular function could not be studied  Impressions:  - Poor acoustic windows limit study, even with the use of Definity.   Overall LVEF is normal ? infeior / inferolateral hypokinesis.   RV is not seen well enough to evaluate function.   Valvular function could not be studied    Discharge Exam: Vitals:   11/03/16 1025 11/03/16 1141  BP: (!) 143/73 (!) 132/54  Pulse: 75 69  Resp:  18  Temp:  98.8 F (37.1 C)   Vitals:   11/02/16 2315 11/03/16 0500 11/03/16 1025 11/03/16 1141  BP:  (!) 141/52 (!) 143/73 (!) 132/54  Pulse: 71 74 75 69  Resp: 18 20  18   Temp:  98.4 F (36.9 C)  98.8 F (37.1 C)  TempSrc:  Oral  Oral  SpO2: 96% 98%  96%  Weight:  (!) 150.2 kg (331 lb 3.2 oz)    Height:        General: Pt is alert, awake, not in acute distress Cardiovascular: Hard to evaluate due to body habitus, but RRR, S1/S2 + Respiratory: Distant breath sound, no wheezing, no rhonchi Abdominal:  Obese Soft, NT, ND, bowel sounds  + Extremities: 1+ pittind edema b/l, no cyanosis   The results of significant diagnostics from this hospitalization (including imaging, microbiology, ancillary and laboratory) are listed below for reference.     Microbiology: No results found for this or any previous visit (from the past 240 hour(s)).   Labs: BNP (last 3 results)  Recent Labs  10/23/16 1211 10/31/16 1059  BNP 84.1 160.7*   Basic Metabolic Panel:  Recent Labs Lab 10/31/16 1059 11/01/16 0444 11/01/16 1626 11/02/16 1224 11/03/16 0400  NA 139 139  --  138 139  K 4.3 4.5  --  4.2 4.2  CL 101 102  --  101 100*  CO2 27 30  --  26 29  GLUCOSE 185* 177*  --  147* 154*  BUN 41* 50*  --  58* 60*  CREATININE 2.90* 3.49*  --  3.48* 3.59*  CALCIUM 9.5 9.5  --  9.8 10.0  MG  --   --   --   --  2.2  PHOS  --   --  4.0 3.5  --    Liver Function Tests:  Recent Labs Lab 11/01/16 0444 11/02/16 1224  AST 14*  --   ALT 11*  --   ALKPHOS 60  --   BILITOT 0.5  --   PROT 6.7  --   ALBUMIN 2.5* 2.7*   No results for input(s): LIPASE, AMYLASE in the last 168 hours. No results for input(s): AMMONIA in the last 168 hours. CBC:  Recent Labs Lab 10/31/16 1059 11/01/16 0444 11/02/16 1224  WBC 12.7* 10.0 9.8  NEUTROABS  --  7.6  --   HGB 8.4* 7.5* 7.6*  HCT 29.1* 26.7* 25.8*  MCV 81.3 81.9 80.4  PLT 274 273 310   Cardiac Enzymes: No results for input(s): CKTOTAL, CKMB, CKMBINDEX, TROPONINI in the last 168 hours. BNP:  Invalid input(s): POCBNP CBG:  Recent Labs Lab 11/02/16 1201 11/02/16 1641 11/02/16 2129 11/03/16 0642 11/03/16 1141  GLUCAP 140* 151* 190* 142* 154*   D-Dimer No results for input(s): DDIMER in the last 72 hours. Hgb A1c No results for input(s): HGBA1C in the last 72 hours. Lipid Profile No results for input(s): CHOL, HDL, LDLCALC, TRIG, CHOLHDL, LDLDIRECT in the last 72 hours. Thyroid function studies No results for input(s): TSH, T4TOTAL, T3FREE, THYROIDAB in the last 72  hours.  Invalid input(s): FREET3 Anemia work up  Recent Labs  11/01/16 1626  FERRITIN 632*  TIBC 196*  IRON 10*   Urinalysis    Component Value Date/Time   COLORURINE YELLOW 10/31/2016 1844   APPEARANCEUR HAZY (A) 10/31/2016 1844   LABSPEC 1.014 10/31/2016 1844   PHURINE 5.0 10/31/2016 1844   GLUCOSEU NEGATIVE 10/31/2016 1844   HGBUR NEGATIVE 10/31/2016 1844   BILIRUBINUR NEGATIVE 10/31/2016 1844   KETONESUR NEGATIVE 10/31/2016 1844   PROTEINUR 100 (A) 10/31/2016 1844   UROBILINOGEN 0.2 07/19/2015 1939   NITRITE NEGATIVE 10/31/2016 1844   LEUKOCYTESUR NEGATIVE 10/31/2016 1844   Sepsis Labs Invalid input(s): PROCALCITONIN,  WBC,  LACTICIDVEN Microbiology No results found for this or any previous visit (from the past 240 hour(s)).   Time coordinating discharge: Over 30 minutes  SIGNED:  Chipper Oman, MD  [facility] 11/03/2016, 3:41 PM Pager   If 7PM-7AM, please contact night-coverage [email or web address] Password [email or web address]

## 2016-11-03 NOTE — Progress Notes (Signed)
  Echocardiogram 2D Echocardiogram has been performed.  Donata Clay 11/03/2016, 9:35 AM

## 2016-11-03 NOTE — Progress Notes (Signed)
Nutrition Education Note  RD consulted for nutrition education regarding "diet plan for home".   RD attempted to see patient on 11/14, but pt was out of room at time of visit. RD familiar with patient from previous admission at which time low sodium diet was discussed. Pt states that she knows what to do, but gets off track and eats out. She reports plans to cook with her daughters at home. She denies any additional nutrition education needs or meal planning needs at this time.  RD encouraged pt to see outpatient nutrition counseling, as provided in packet 11/14, if she continues to struggle with sodium restriction.   Scarlette Ar RD, CSP, LDN Inpatient Clinical Dietitian Pager: [phone] After Hours Pager: [phone]

## 2016-11-05 DIAGNOSIS — E669 Obesity, unspecified: Secondary | ICD-10-CM | POA: Diagnosis not present

## 2016-11-05 DIAGNOSIS — I5033 Acute on chronic diastolic (congestive) heart failure: Secondary | ICD-10-CM | POA: Diagnosis not present

## 2016-11-05 DIAGNOSIS — I130 Hypertensive heart and chronic kidney disease with heart failure and stage 1 through stage 4 chronic kidney disease, or unspecified chronic kidney disease: Secondary | ICD-10-CM | POA: Diagnosis not present

## 2016-11-05 DIAGNOSIS — Z794 Long term (current) use of insulin: Secondary | ICD-10-CM | POA: Diagnosis not present

## 2016-11-05 DIAGNOSIS — E1142 Type 2 diabetes mellitus with diabetic polyneuropathy: Secondary | ICD-10-CM | POA: Diagnosis not present

## 2016-11-05 DIAGNOSIS — E1122 Type 2 diabetes mellitus with diabetic chronic kidney disease: Secondary | ICD-10-CM | POA: Diagnosis not present

## 2016-11-05 DIAGNOSIS — N184 Chronic kidney disease, stage 4 (severe): Secondary | ICD-10-CM | POA: Diagnosis not present

## 2016-11-05 DIAGNOSIS — D649 Anemia, unspecified: Secondary | ICD-10-CM | POA: Diagnosis not present

## 2016-11-05 DIAGNOSIS — G4733 Obstructive sleep apnea (adult) (pediatric): Secondary | ICD-10-CM | POA: Diagnosis not present

## 2016-11-05 DIAGNOSIS — J449 Chronic obstructive pulmonary disease, unspecified: Secondary | ICD-10-CM | POA: Diagnosis not present

## 2016-11-07 DIAGNOSIS — E1140 Type 2 diabetes mellitus with diabetic neuropathy, unspecified: Secondary | ICD-10-CM | POA: Diagnosis not present

## 2016-11-07 DIAGNOSIS — I10 Essential (primary) hypertension: Secondary | ICD-10-CM | POA: Diagnosis not present

## 2016-11-07 DIAGNOSIS — I509 Heart failure, unspecified: Secondary | ICD-10-CM | POA: Diagnosis not present

## 2016-11-07 DIAGNOSIS — N184 Chronic kidney disease, stage 4 (severe): Secondary | ICD-10-CM | POA: Diagnosis not present

## 2016-11-07 DIAGNOSIS — K219 Gastro-esophageal reflux disease without esophagitis: Secondary | ICD-10-CM | POA: Diagnosis not present

## 2016-11-08 DIAGNOSIS — N184 Chronic kidney disease, stage 4 (severe): Secondary | ICD-10-CM | POA: Diagnosis not present

## 2016-11-08 DIAGNOSIS — I5033 Acute on chronic diastolic (congestive) heart failure: Secondary | ICD-10-CM | POA: Diagnosis not present

## 2016-11-08 DIAGNOSIS — E1122 Type 2 diabetes mellitus with diabetic chronic kidney disease: Secondary | ICD-10-CM | POA: Diagnosis not present

## 2016-11-08 DIAGNOSIS — E1142 Type 2 diabetes mellitus with diabetic polyneuropathy: Secondary | ICD-10-CM | POA: Diagnosis not present

## 2016-11-08 DIAGNOSIS — I130 Hypertensive heart and chronic kidney disease with heart failure and stage 1 through stage 4 chronic kidney disease, or unspecified chronic kidney disease: Secondary | ICD-10-CM | POA: Diagnosis not present

## 2016-11-08 DIAGNOSIS — J449 Chronic obstructive pulmonary disease, unspecified: Secondary | ICD-10-CM | POA: Diagnosis not present

## 2016-11-09 ENCOUNTER — Ambulatory Visit (INDEPENDENT_AMBULATORY_CARE_PROVIDER_SITE_OTHER): Payer: Medicare Other | Admitting: Internal Medicine

## 2016-11-09 ENCOUNTER — Encounter: Payer: Self-pay | Admitting: Internal Medicine

## 2016-11-09 VITALS — BP 124/76 | HR 69 | Ht 62.0 in | Wt 324.0 lb

## 2016-11-09 DIAGNOSIS — I129 Hypertensive chronic kidney disease with stage 1 through stage 4 chronic kidney disease, or unspecified chronic kidney disease: Secondary | ICD-10-CM | POA: Diagnosis not present

## 2016-11-09 DIAGNOSIS — D509 Iron deficiency anemia, unspecified: Secondary | ICD-10-CM | POA: Diagnosis not present

## 2016-11-09 DIAGNOSIS — G4730 Sleep apnea, unspecified: Secondary | ICD-10-CM | POA: Diagnosis not present

## 2016-11-09 DIAGNOSIS — N183 Chronic kidney disease, stage 3 (moderate): Secondary | ICD-10-CM | POA: Diagnosis not present

## 2016-11-09 DIAGNOSIS — N2581 Secondary hyperparathyroidism of renal origin: Secondary | ICD-10-CM | POA: Diagnosis not present

## 2016-11-09 DIAGNOSIS — N179 Acute kidney failure, unspecified: Secondary | ICD-10-CM | POA: Diagnosis not present

## 2016-11-09 DIAGNOSIS — E1129 Type 2 diabetes mellitus with other diabetic kidney complication: Secondary | ICD-10-CM | POA: Diagnosis not present

## 2016-11-09 DIAGNOSIS — R0609 Other forms of dyspnea: Secondary | ICD-10-CM

## 2016-11-09 DIAGNOSIS — Z6841 Body Mass Index (BMI) 40.0 and over, adult: Secondary | ICD-10-CM | POA: Diagnosis not present

## 2016-11-09 DIAGNOSIS — G4733 Obstructive sleep apnea (adult) (pediatric): Secondary | ICD-10-CM

## 2016-11-09 DIAGNOSIS — I509 Heart failure, unspecified: Secondary | ICD-10-CM | POA: Diagnosis not present

## 2016-11-09 DIAGNOSIS — M109 Gout, unspecified: Secondary | ICD-10-CM | POA: Diagnosis not present

## 2016-11-09 DIAGNOSIS — R300 Dysuria: Secondary | ICD-10-CM | POA: Diagnosis not present

## 2016-11-09 DIAGNOSIS — R809 Proteinuria, unspecified: Secondary | ICD-10-CM | POA: Diagnosis not present

## 2016-11-09 NOTE — Progress Notes (Signed)
Subjective:    Patient ID: Catherine Williams, female    DOB: 12-05-1961     MRN: [id number]    Brief patient profile:  53 yobf never cig smoker no resp problems as child smoked a lot of MJ as adult quit smoking completely 2004  but around 2010 at wt around 300  dx as copd and never had pfts until 08/27/2015 with restrictive changes only / placed on cpap x 2014 and referred to pulmonary clinic by Dr Jackson Latino  08/27/2015  p admit and newly started on 02 :   Admit date: 07/16/2015 Discharge date: 07/21/2015     Recommendations for Outpatient Follow-up:  1. Reassess BP and adjust medications as needed 2. Check BMET to follow electrolytes and renal function 3. Repeat CBC to follow Hgb trend 4. Encourage patient and assist with weight loss program  Discharge Diagnoses:  Principal Problem:  Acute respiratory failure with hypoxia   OSA (obstructive sleep apnea)  CKD (chronic kidney disease) stage 3, GFR 30-59 ml/min  Diabetes mellitus type 2, controlled  Chronic anemia  Hypertension  Acute on chronic diastolic heart failure   Morbid obesity    08/27/2015  Pulmonary office visit/ Catherine Williams  (had seen Sleep medicine = [facility] in 2014)  Chief Complaint  Patient presents with  . Pulmonary Consult    Referred by Dr. Gertie Gowda. Pt states that she was dxed with COPD in 2012. She states she only developed problem with her breathing approx 1 yr ago. She states that she gets SOB with grocery shopping and minimal exertion.    sob  occurs with shopping but also with getting dressed at wt now of   346/ never symptoms at rest or noct on cpap/02 per [facility].  rec lease see patient coordinator before you leave today  to schedule ono while on your set up of 02 thru the cpap  Machine. Please schedule a follow up office visit in 6 weeks, call sooner if needed with full pfts   -  Spirometry 08/27/2015  FEV1  1.0 (50%) ratio 74 - 08/27/2015  Walked RA  2 laps @ 185 ft each stopped due to  Back/leg pain no  sob at slow pace, no desat  - PFT's  10/21/2015  FEV1 1.29 (62 % ) ratio 79  p 7 % improvement from saba with DLCO  62 % corrects to 106  % for alv volume     10/21/15 ov rec Work on the weight loss  Get Dr C to check your TSH at next blood check GERD diet     11/09/2016  f/u ov/Catherine Williams re: MO/doe/ osa/ noct hypoxemia  Chief Complaint  Patient presents with  . Follow-up    Pt states she is having trouble using CPAP- the machine itself is not working properly.   not using cpap  x 3 months / 2lpm hs / feels tired with HA in am which is some better as day goes on / no hypersomnolence Only using saba if over does it with ex, never noct or at rest  Sleeping with 3 pillows on either side at 30 degrees elevation of HOB Doe = MMRC2 = can't walk a nl pace on a flat grade s sob but does fine slow and flat eg shopping   No obvious day to day or daytime variability or assoc excess/ purulent sputum or mucus plugs or hemoptysis or cp or chest tightness, subjective wheeze or overt sinus or hb symptoms. No unusual exp hx or  h/o childhood pna/ asthma or knowledge of premature birth.  Sleeping ok without nocturnal  or early am exacerbation  of respiratory  c/o's or need for noct saba. Also denies any obvious fluctuation of symptoms with weather or environmental changes or other aggravating or alleviating factors except as outlined above   Current Medications, Allergies, Complete Past Medical History, Past Surgical History, Family History, and Social History were reviewed in [employer] record.  ROS  The following are not active complaints unless bolded sore throat, dysphagia, dental problems, itching, sneezing,  nasal congestion or excess/ purulent secretions, ear ache,   fever, chills, sweats, unintended wt loss, classically pleuritic or exertional cp,  orthopnea pnd or leg swelling, presyncope, palpitations, abdominal pain, anorexia, nausea, vomiting, diarrhea  or change in bowel or  bladder habits, change in stools or urine, dysuria,hematuria,  rash, arthralgias, visual complaints, headache, numbness, weakness or ataxia or problems with walking or coordination,  change in mood/affect or memory.                    Objective:   Physical Exam  amb massively obese  bf nad  11/09/2016       324  10/21/2015         344     08/27/15 346 lb 9.6 oz (157.217 kg)  07/21/15 347 lb 3.6 oz (157.5 kg)  07/02/15 342 lb (155.13 kg)    Vital signs reviewed  - Note on arrival 02 sats  98% on RA     HEENT: edentulous/ nl turbinates, and orophanx. Nl external ear canals without cough reflex Modified Mallampati Score =   3   NECK :  without JVD/Nodes/TM/ nl carotid upstrokes bilaterally   LUNGS: no acc muscle use, clear to A and P bilaterally without cough on insp or exp maneuvers   CV:  RRR  no s3 or murmur or increase in P2, no edema   ABD:  soft and nontender with nl excursion in the supine position. No bruits or organomegaly, bowel sounds nl  MS:  warm without deformities, calf tenderness, cyanosis or clubbing  SKIN: warm and dry without lesions    NEURO:  alert, approp, no deficits       I personally reviewed images and agree with radiology impression as follows:  CXR:  11/01/16  Stable mild central pulmonary vascular congestion with probable mild bilateral perihilar edema. .                Assessment & Plan:

## 2016-11-09 NOTE — Assessment & Plan Note (Signed)
Body mass index is 59.26 slt better  But a long way to go  Lab Results  Component Value Date   TSH 3.661 10/23/2016     Contributing to gerd tendency/ doe/reviewed the need and the process to achieve and maintain neg calorie balance > defer f/u primary care including intermittently monitoring thyroid status

## 2016-11-09 NOTE — Patient Instructions (Signed)
[facility]  You will need a follow up to establish with a sleep specialist here - next available

## 2016-11-09 NOTE — Assessment & Plan Note (Signed)
NPSG 04/2011:  AHI 89/hr, desat to 47% Auto 06/2013:  Optimal pressure 16cm ([facility])  - added 02 2lpm at hs 06/2015 - ono on 02 2lpm and cpap  08/21/15 still desat< 88% x 78 m > rec repeat on 3lpm /cpap 10/07/15 no desats > no change rx - 11/09/2016 cpap machine not functioning x 3 m reported > rec have [facility] then f/u here with sleep medicine

## 2016-11-09 NOTE — Assessment & Plan Note (Signed)
-    Spirometry 08/27/2015  FEV1  1.0 (50%) ratio 74 - 08/27/2015  Walked RA  2 laps @ 185 ft each stopped due to  Back/leg pain no sob at slow pace, no desat  - PFT's  10/21/2015  FEV1 1.29 (62 % ) ratio 79  p 7 % improvement from saba with DLCO  62 % corrects to 106  % for alv volume    C/w restrictive changes from obesity/ can't exclude asthma but note no need for noct saba and only time she needs alb is after heavy exertion/ never takes it before ex so doubt  This is EIA or any form of asthma and clearly mostly restrictive

## 2016-11-11 DIAGNOSIS — E1142 Type 2 diabetes mellitus with diabetic polyneuropathy: Secondary | ICD-10-CM | POA: Diagnosis not present

## 2016-11-11 DIAGNOSIS — J449 Chronic obstructive pulmonary disease, unspecified: Secondary | ICD-10-CM | POA: Diagnosis not present

## 2016-11-11 DIAGNOSIS — I130 Hypertensive heart and chronic kidney disease with heart failure and stage 1 through stage 4 chronic kidney disease, or unspecified chronic kidney disease: Secondary | ICD-10-CM | POA: Diagnosis not present

## 2016-11-11 DIAGNOSIS — N184 Chronic kidney disease, stage 4 (severe): Secondary | ICD-10-CM | POA: Diagnosis not present

## 2016-11-11 DIAGNOSIS — I5033 Acute on chronic diastolic (congestive) heart failure: Secondary | ICD-10-CM | POA: Diagnosis not present

## 2016-11-11 DIAGNOSIS — E1122 Type 2 diabetes mellitus with diabetic chronic kidney disease: Secondary | ICD-10-CM | POA: Diagnosis not present

## 2016-11-14 DIAGNOSIS — J449 Chronic obstructive pulmonary disease, unspecified: Secondary | ICD-10-CM | POA: Diagnosis not present

## 2016-11-14 DIAGNOSIS — N184 Chronic kidney disease, stage 4 (severe): Secondary | ICD-10-CM | POA: Diagnosis not present

## 2016-11-14 DIAGNOSIS — I130 Hypertensive heart and chronic kidney disease with heart failure and stage 1 through stage 4 chronic kidney disease, or unspecified chronic kidney disease: Secondary | ICD-10-CM | POA: Diagnosis not present

## 2016-11-14 DIAGNOSIS — I5033 Acute on chronic diastolic (congestive) heart failure: Secondary | ICD-10-CM | POA: Diagnosis not present

## 2016-11-14 DIAGNOSIS — E1142 Type 2 diabetes mellitus with diabetic polyneuropathy: Secondary | ICD-10-CM | POA: Diagnosis not present

## 2016-11-14 DIAGNOSIS — E1122 Type 2 diabetes mellitus with diabetic chronic kidney disease: Secondary | ICD-10-CM | POA: Diagnosis not present

## 2016-11-16 DIAGNOSIS — I130 Hypertensive heart and chronic kidney disease with heart failure and stage 1 through stage 4 chronic kidney disease, or unspecified chronic kidney disease: Secondary | ICD-10-CM | POA: Diagnosis not present

## 2016-11-16 DIAGNOSIS — E1142 Type 2 diabetes mellitus with diabetic polyneuropathy: Secondary | ICD-10-CM | POA: Diagnosis not present

## 2016-11-16 DIAGNOSIS — N184 Chronic kidney disease, stage 4 (severe): Secondary | ICD-10-CM | POA: Diagnosis not present

## 2016-11-16 DIAGNOSIS — E1122 Type 2 diabetes mellitus with diabetic chronic kidney disease: Secondary | ICD-10-CM | POA: Diagnosis not present

## 2016-11-16 DIAGNOSIS — I5033 Acute on chronic diastolic (congestive) heart failure: Secondary | ICD-10-CM | POA: Diagnosis not present

## 2016-11-16 DIAGNOSIS — J449 Chronic obstructive pulmonary disease, unspecified: Secondary | ICD-10-CM | POA: Diagnosis not present

## 2016-11-18 ENCOUNTER — Encounter (HOSPITAL_COMMUNITY)
Admission: RE | Admit: 2016-11-18 | Discharge: 2016-11-18 | Disposition: A | Discharge status: Home / Self Care | Payer: Medicare Other | Source: Ambulatory Visit | Attending: Nephrology | Admitting: Nephrology

## 2016-11-18 DIAGNOSIS — D638 Anemia in other chronic diseases classified elsewhere: Secondary | ICD-10-CM | POA: Diagnosis not present

## 2016-11-18 DIAGNOSIS — N183 Chronic kidney disease, stage 3 unspecified: Secondary | ICD-10-CM

## 2016-11-18 LAB — POCT HEMOGLOBIN-HEMACUE: Hemoglobin: 10.3 g/dL — ABNORMAL LOW (ref 12.0–15.0)

## 2016-11-18 MED ORDER — DARBEPOETIN ALFA 150 MCG/0.3ML IJ SOSY
PREFILLED_SYRINGE | INTRAMUSCULAR | Status: AC
  Filled 2016-11-18: qty 0.3

## 2016-11-18 MED ORDER — DARBEPOETIN ALFA 150 MCG/0.3ML IJ SOSY
120.0000 ug | PREFILLED_SYRINGE | INTRAMUSCULAR | Status: DC
  Administered 2016-11-18: 120 ug via SUBCUTANEOUS

## 2016-11-23 DIAGNOSIS — J449 Chronic obstructive pulmonary disease, unspecified: Secondary | ICD-10-CM | POA: Diagnosis not present

## 2016-11-23 DIAGNOSIS — I130 Hypertensive heart and chronic kidney disease with heart failure and stage 1 through stage 4 chronic kidney disease, or unspecified chronic kidney disease: Secondary | ICD-10-CM | POA: Diagnosis not present

## 2016-11-23 DIAGNOSIS — E1122 Type 2 diabetes mellitus with diabetic chronic kidney disease: Secondary | ICD-10-CM | POA: Diagnosis not present

## 2016-11-23 DIAGNOSIS — I5033 Acute on chronic diastolic (congestive) heart failure: Secondary | ICD-10-CM | POA: Diagnosis not present

## 2016-11-23 DIAGNOSIS — N184 Chronic kidney disease, stage 4 (severe): Secondary | ICD-10-CM | POA: Diagnosis not present

## 2016-11-23 DIAGNOSIS — E1142 Type 2 diabetes mellitus with diabetic polyneuropathy: Secondary | ICD-10-CM | POA: Diagnosis not present

## 2016-11-25 DIAGNOSIS — E1122 Type 2 diabetes mellitus with diabetic chronic kidney disease: Secondary | ICD-10-CM | POA: Diagnosis not present

## 2016-11-25 DIAGNOSIS — I5033 Acute on chronic diastolic (congestive) heart failure: Secondary | ICD-10-CM | POA: Diagnosis not present

## 2016-11-25 DIAGNOSIS — E1142 Type 2 diabetes mellitus with diabetic polyneuropathy: Secondary | ICD-10-CM | POA: Diagnosis not present

## 2016-11-25 DIAGNOSIS — I130 Hypertensive heart and chronic kidney disease with heart failure and stage 1 through stage 4 chronic kidney disease, or unspecified chronic kidney disease: Secondary | ICD-10-CM | POA: Diagnosis not present

## 2016-11-25 DIAGNOSIS — J449 Chronic obstructive pulmonary disease, unspecified: Secondary | ICD-10-CM | POA: Diagnosis not present

## 2016-11-25 DIAGNOSIS — N184 Chronic kidney disease, stage 4 (severe): Secondary | ICD-10-CM | POA: Diagnosis not present

## 2016-11-28 ENCOUNTER — Other Ambulatory Visit (INDEPENDENT_AMBULATORY_CARE_PROVIDER_SITE_OTHER): Payer: Self-pay | Admitting: Specialist

## 2016-11-28 DIAGNOSIS — I130 Hypertensive heart and chronic kidney disease with heart failure and stage 1 through stage 4 chronic kidney disease, or unspecified chronic kidney disease: Secondary | ICD-10-CM | POA: Diagnosis not present

## 2016-11-28 DIAGNOSIS — I5033 Acute on chronic diastolic (congestive) heart failure: Secondary | ICD-10-CM | POA: Diagnosis not present

## 2016-11-28 DIAGNOSIS — E1122 Type 2 diabetes mellitus with diabetic chronic kidney disease: Secondary | ICD-10-CM | POA: Diagnosis not present

## 2016-11-28 DIAGNOSIS — N184 Chronic kidney disease, stage 4 (severe): Secondary | ICD-10-CM | POA: Diagnosis not present

## 2016-11-28 DIAGNOSIS — E1142 Type 2 diabetes mellitus with diabetic polyneuropathy: Secondary | ICD-10-CM | POA: Diagnosis not present

## 2016-11-28 DIAGNOSIS — J449 Chronic obstructive pulmonary disease, unspecified: Secondary | ICD-10-CM | POA: Diagnosis not present

## 2016-11-28 NOTE — Telephone Encounter (Signed)
Ok to fill 

## 2016-11-29 DIAGNOSIS — I10 Essential (primary) hypertension: Secondary | ICD-10-CM | POA: Diagnosis not present

## 2016-11-29 DIAGNOSIS — E1040 Type 1 diabetes mellitus with diabetic neuropathy, unspecified: Secondary | ICD-10-CM | POA: Diagnosis not present

## 2016-11-29 DIAGNOSIS — I509 Heart failure, unspecified: Secondary | ICD-10-CM | POA: Diagnosis not present

## 2016-11-29 DIAGNOSIS — J019 Acute sinusitis, unspecified: Secondary | ICD-10-CM | POA: Diagnosis not present

## 2016-11-29 DIAGNOSIS — J449 Chronic obstructive pulmonary disease, unspecified: Secondary | ICD-10-CM | POA: Diagnosis not present

## 2016-11-29 DIAGNOSIS — K219 Gastro-esophageal reflux disease without esophagitis: Secondary | ICD-10-CM | POA: Diagnosis not present

## 2016-11-29 NOTE — Telephone Encounter (Signed)
Called into pharm  

## 2016-11-30 DIAGNOSIS — N184 Chronic kidney disease, stage 4 (severe): Secondary | ICD-10-CM | POA: Diagnosis not present

## 2016-11-30 DIAGNOSIS — J449 Chronic obstructive pulmonary disease, unspecified: Secondary | ICD-10-CM | POA: Diagnosis not present

## 2016-11-30 DIAGNOSIS — I130 Hypertensive heart and chronic kidney disease with heart failure and stage 1 through stage 4 chronic kidney disease, or unspecified chronic kidney disease: Secondary | ICD-10-CM | POA: Diagnosis not present

## 2016-11-30 DIAGNOSIS — E1122 Type 2 diabetes mellitus with diabetic chronic kidney disease: Secondary | ICD-10-CM | POA: Diagnosis not present

## 2016-11-30 DIAGNOSIS — I5033 Acute on chronic diastolic (congestive) heart failure: Secondary | ICD-10-CM | POA: Diagnosis not present

## 2016-11-30 DIAGNOSIS — E1142 Type 2 diabetes mellitus with diabetic polyneuropathy: Secondary | ICD-10-CM | POA: Diagnosis not present

## 2016-12-02 ENCOUNTER — Inpatient Hospital Stay (HOSPITAL_COMMUNITY): Admission: RE | Admit: 2016-12-02 | Payer: Medicare Other | Source: Ambulatory Visit

## 2016-12-02 DIAGNOSIS — N183 Chronic kidney disease, stage 3 (moderate): Secondary | ICD-10-CM | POA: Diagnosis not present

## 2016-12-05 DIAGNOSIS — I129 Hypertensive chronic kidney disease with stage 1 through stage 4 chronic kidney disease, or unspecified chronic kidney disease: Secondary | ICD-10-CM | POA: Diagnosis not present

## 2016-12-05 DIAGNOSIS — N179 Acute kidney failure, unspecified: Secondary | ICD-10-CM | POA: Diagnosis not present

## 2016-12-05 DIAGNOSIS — N2581 Secondary hyperparathyroidism of renal origin: Secondary | ICD-10-CM | POA: Diagnosis not present

## 2016-12-05 DIAGNOSIS — D509 Iron deficiency anemia, unspecified: Secondary | ICD-10-CM | POA: Diagnosis not present

## 2016-12-05 DIAGNOSIS — R809 Proteinuria, unspecified: Secondary | ICD-10-CM | POA: Diagnosis not present

## 2016-12-05 DIAGNOSIS — E1129 Type 2 diabetes mellitus with other diabetic kidney complication: Secondary | ICD-10-CM | POA: Diagnosis not present

## 2016-12-05 DIAGNOSIS — G4730 Sleep apnea, unspecified: Secondary | ICD-10-CM | POA: Diagnosis not present

## 2016-12-05 DIAGNOSIS — N183 Chronic kidney disease, stage 3 (moderate): Secondary | ICD-10-CM | POA: Diagnosis not present

## 2016-12-05 DIAGNOSIS — I509 Heart failure, unspecified: Secondary | ICD-10-CM | POA: Diagnosis not present

## 2016-12-05 DIAGNOSIS — E559 Vitamin D deficiency, unspecified: Secondary | ICD-10-CM | POA: Diagnosis not present

## 2016-12-07 DIAGNOSIS — E1142 Type 2 diabetes mellitus with diabetic polyneuropathy: Secondary | ICD-10-CM | POA: Diagnosis not present

## 2016-12-07 DIAGNOSIS — I5033 Acute on chronic diastolic (congestive) heart failure: Secondary | ICD-10-CM | POA: Diagnosis not present

## 2016-12-07 DIAGNOSIS — N184 Chronic kidney disease, stage 4 (severe): Secondary | ICD-10-CM | POA: Diagnosis not present

## 2016-12-07 DIAGNOSIS — I130 Hypertensive heart and chronic kidney disease with heart failure and stage 1 through stage 4 chronic kidney disease, or unspecified chronic kidney disease: Secondary | ICD-10-CM | POA: Diagnosis not present

## 2016-12-07 DIAGNOSIS — E1122 Type 2 diabetes mellitus with diabetic chronic kidney disease: Secondary | ICD-10-CM | POA: Diagnosis not present

## 2016-12-07 DIAGNOSIS — J449 Chronic obstructive pulmonary disease, unspecified: Secondary | ICD-10-CM | POA: Diagnosis not present

## 2016-12-13 DIAGNOSIS — N183 Chronic kidney disease, stage 3 (moderate): Secondary | ICD-10-CM | POA: Diagnosis not present

## 2016-12-13 DIAGNOSIS — I10 Essential (primary) hypertension: Secondary | ICD-10-CM | POA: Diagnosis not present

## 2016-12-14 ENCOUNTER — Encounter (HOSPITAL_COMMUNITY): Payer: Medicare Other

## 2016-12-20 DIAGNOSIS — N183 Chronic kidney disease, stage 3 (moderate): Secondary | ICD-10-CM | POA: Diagnosis not present

## 2016-12-21 ENCOUNTER — Encounter (HOSPITAL_COMMUNITY)
Admission: RE | Admit: 2016-12-21 | Discharge: 2016-12-21 | Disposition: A | Discharge status: Home / Self Care | Payer: Medicare Other | Source: Ambulatory Visit | Attending: Nephrology | Admitting: Nephrology

## 2016-12-21 DIAGNOSIS — D638 Anemia in other chronic diseases classified elsewhere: Secondary | ICD-10-CM | POA: Insufficient documentation

## 2016-12-21 DIAGNOSIS — N183 Chronic kidney disease, stage 3 unspecified: Secondary | ICD-10-CM

## 2016-12-21 LAB — CBC
HCT: 35.8 % — ABNORMAL LOW (ref 36.0–46.0)
HEMOGLOBIN: 11.1 g/dL — AB (ref 12.0–15.0)
MCH: 24.7 pg — ABNORMAL LOW (ref 26.0–34.0)
MCHC: 31 g/dL (ref 30.0–36.0)
MCV: 79.6 fL (ref 78.0–100.0)
PLATELETS: 193 10*3/uL (ref 150–400)
RBC: 4.5 MIL/uL (ref 3.87–5.11)
RDW: 18.7 % — ABNORMAL HIGH (ref 11.5–15.5)
WBC: 10.6 10*3/uL — ABNORMAL HIGH (ref 4.0–10.5)

## 2016-12-21 LAB — RENAL FUNCTION PANEL
ALBUMIN: 3.7 g/dL (ref 3.5–5.0)
ANION GAP: 15 (ref 5–15)
BUN: 135 mg/dL — ABNORMAL HIGH (ref 6–20)
CALCIUM: 10.8 mg/dL — AB (ref 8.9–10.3)
CO2: 31 mmol/L (ref 22–32)
Chloride: 88 mmol/L — ABNORMAL LOW (ref 101–111)
Creatinine, Ser: 4.57 mg/dL — ABNORMAL HIGH (ref 0.44–1.00)
GFR calc non Af Amer: 10 mL/min — ABNORMAL LOW (ref >60)
GFR, EST AFRICAN AMERICAN: 11 mL/min — AB (ref >60)
GLUCOSE: 188 mg/dL — AB (ref 65–99)
PHOSPHORUS: 3.4 mg/dL (ref 2.5–4.6)
Potassium: 3.1 mmol/L — ABNORMAL LOW (ref 3.5–5.1)
SODIUM: 134 mmol/L — AB (ref 135–145)

## 2016-12-21 MED ORDER — DARBEPOETIN ALFA 60 MCG/0.3ML IJ SOSY
PREFILLED_SYRINGE | INTRAMUSCULAR | Status: AC
  Filled 2016-12-21: qty 0.3

## 2016-12-21 MED ORDER — DARBEPOETIN ALFA 150 MCG/0.3ML IJ SOSY: 120.0000 ug | PREFILLED_SYRINGE | INTRAMUSCULAR | Status: DC

## 2016-12-21 MED ORDER — DARBEPOETIN ALFA 60 MCG/0.3ML IJ SOSY
PREFILLED_SYRINGE | INTRAMUSCULAR | Status: AC
  Administered 2016-12-21: 120 ug
  Filled 2016-12-21: qty 0.3

## 2016-12-22 ENCOUNTER — Encounter (HOSPITAL_COMMUNITY): Payer: Medicare Other

## 2016-12-27 DIAGNOSIS — N183 Chronic kidney disease, stage 3 (moderate): Secondary | ICD-10-CM | POA: Diagnosis not present

## 2016-12-28 ENCOUNTER — Ambulatory Visit (INDEPENDENT_AMBULATORY_CARE_PROVIDER_SITE_OTHER): Payer: Medicare Other | Admitting: Pulmonary Disease

## 2016-12-28 ENCOUNTER — Encounter: Payer: Self-pay | Admitting: Pulmonary Disease

## 2016-12-28 ENCOUNTER — Encounter (INDEPENDENT_AMBULATORY_CARE_PROVIDER_SITE_OTHER): Payer: Self-pay

## 2016-12-28 VITALS — BP 124/78 | HR 74 | Ht 62.0 in | Wt 298.0 lb

## 2016-12-28 DIAGNOSIS — I5033 Acute on chronic diastolic (congestive) heart failure: Secondary | ICD-10-CM | POA: Diagnosis not present

## 2016-12-28 DIAGNOSIS — G4733 Obstructive sleep apnea (adult) (pediatric): Secondary | ICD-10-CM | POA: Diagnosis not present

## 2016-12-28 NOTE — Assessment & Plan Note (Signed)
She has daytime hypercarbia noted on prior ABG during an acute de element of obesity hypoventilation  She does not seem to have  Significant obstructive lung dise  Obtain CPAP download Check nocturnal oximetry on CPAP and 2 L  Weight loss encouraged, compliance with goal of at least 4-6 hrs every night is the expectation. Advised against medications with sedative side effects Cautioned against driving when sleepy - understanding that sleepiness will vary on a day to day basis

## 2016-12-28 NOTE — Patient Instructions (Addendum)
   Obtain CPAP download Check nocturnal oximetry on CPAP and 2 L

## 2016-12-28 NOTE — Progress Notes (Signed)
Subjective:    Patient ID: Catherine Williams, female    DOB: 1961/12/19, 56 y.o.   MRN: [id number]  HPI  Chief Complaint  Patient presents with  . sleep consult    Pt. is having problems with sleep at night. Will sleep for a few hours and then will wake back up. Pt. believes it may be due to her having to use the bathroom. Denies any SOB or chest pain during the night. Had a sleep study done on 05/08/2011 with [facility].     56 year old morbidly obese ex-smoker referred for evaluation of obstructive sleep apnea. OSA was diagnosed by an overnigh disease with AHI of 89/hour and desaturation to 47%. This was corrected on an auto CPAP with optimal pr measured at 16 cm.  She had 2 L of oxygen blended in since 2016.  She also has chronic diastolic HF, hypertension diabetes and kidney disease She's lost 50 pounds in the last 2 years. She was admitted 10/2016 with acute on chronic diastolic heart f and improved with diuresis.  Epworth sleepiness score is 3. Bedtime is around 10 PM, sleep  Latency is mini, she sleeps on her side with 3 pillows,3-4 nocturnal awakenings due to nocturia and is out of bed by 9 AM feeling rested with occasional dryness of  Mouth and headaches.  There is no history suggestive of cataplexy, sleep paralysis or parasomnias  ABG 06/2015 was 7.35/62/177 suggesting hypercarbia-this was during an acute hospitalization  For acute diastolic heart failure       Significant tests/ events  NPSG 04/2011:  AHI 89/hr, desat to 47% Auto 06/2013:  Optimal pressure 16cm ([place])  - added 02 2lpm at hs 06/2015    Past Medical History:  Diagnosis Date  . Anemia   . Arthritis Dec. 2014   Gout  . Asthma   . CHF (congestive heart failure) ([place])   . COPD (chronic obstructive pulmonary disease) ([place])   . Diabetes mellitus   . Heart failure   . Hyperlipidemia   . Hypertension   . Kidney disease   . Morbid obesity ([place])   . Peripheral vascular disease ([place])   . Pinched nerve   .  Sleep apnea      Past Surgical History:  Procedure Laterality Date  . Atherectomy and angioplasty  10/18/2011   left posterior tibial artery  . HAND SURGERY     right  . OVARY SURGERY     Left  . TOE AMPUTATION  Sept. 25,2012   Left 4th and 5th toes    Social History   Social History  . Marital status: Single    Spouse name: N/A  . Number of children: Y  . Years of education: N/A   Occupational History  . 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   Social History Main Topics  . Smoking status: Never Smoker  . Smokeless tobacco: Never Used  . Alcohol use No  . Drug use:     Types: Marijuana     Comment: FORMER>>smoked weed from age 46 to 38>> quit at age 18   . Sexual activity: Not on file   Other Topics Concern  . Not on file   Social History Narrative   Lives with daughter           Family History  Problem Relation Age of Onset  . Lung cancer Mother   . Clotting disorder Mother   . Heart disease Mother   . Cancer Mother  Lung  . Deep vein thrombosis Mother   . Diabetes Mother   . Hypertension Mother   . Varicose Veins Mother   . Cancer Father   . Clotting disorder Sister   . Heart attack Sister   . Diabetes Sister   . Clotting disorder Brother   . Deep vein thrombosis Brother   . Diabetes Brother   . Heart disease Brother   . Heart disease Sister      Review of Systems Constitutional: negative for anorexia, fevers and sweats  Eyes: negative for irritation, redness and visual disturbance  Ears, nose, mouth, throat, and face: negative for earaches, epistaxis, nasal congestion and sore throat  Respiratory: negative for cough, dyspnea on exertion, sputum and wheezing  Cardiovascular: negative for chest pain, dyspnea, lower extremity edema, orthopnea, palpitations and syncope  Gastrointestinal: negative for abdominal pain, constipation, diarrhea, melena, nausea and vomiting  Genitourinary:negative for dysuria, frequency and  hematuria  Hematologic/lymphatic: negative for bleeding, easy bruising and lymphadenopathy  Musculoskeletal:negative for arthralgias, muscle weakness and stiff joints  Neurological: negative for coordination problems, gait problems, headaches and weakness  Endocrine: negative for diabetic symptoms including polydipsia, polyuria and weight loss     Objective:   Physical Exam  Gen. Pleasant, obese, in no distress, normal affect ENT - no lesions, no post nasal drip, class 2-3 airway Neck: No JVD, no thyromegaly, no carotid bruits Lungs: no use of accessory muscles, no dullness to percussion, decreased without rales or rhonchi  Cardiovascular: Rhythm regular, heart sounds  normal, no murmurs or gallops, no peripheral edema Abdomen: soft and non-tender, no hepatosplenomegaly, BS normal. Musculoskeletal: No deformities, no cyanosis or clubbing Neuro:  alert, non focal, no tremors       Assessment & Plan:

## 2017-01-03 DIAGNOSIS — E1140 Type 2 diabetes mellitus with diabetic neuropathy, unspecified: Secondary | ICD-10-CM | POA: Diagnosis not present

## 2017-01-03 DIAGNOSIS — Z6841 Body Mass Index (BMI) 40.0 and over, adult: Secondary | ICD-10-CM | POA: Diagnosis not present

## 2017-01-03 DIAGNOSIS — J449 Chronic obstructive pulmonary disease, unspecified: Secondary | ICD-10-CM | POA: Diagnosis not present

## 2017-01-03 DIAGNOSIS — I10 Essential (primary) hypertension: Secondary | ICD-10-CM | POA: Diagnosis not present

## 2017-01-03 DIAGNOSIS — N184 Chronic kidney disease, stage 4 (severe): Secondary | ICD-10-CM | POA: Diagnosis not present

## 2017-01-04 ENCOUNTER — Inpatient Hospital Stay (HOSPITAL_COMMUNITY): Admission: RE | Admit: 2017-01-04 | Payer: Medicare Other | Source: Ambulatory Visit

## 2017-01-06 DIAGNOSIS — I130 Hypertensive heart and chronic kidney disease with heart failure and stage 1 through stage 4 chronic kidney disease, or unspecified chronic kidney disease: Secondary | ICD-10-CM | POA: Diagnosis not present

## 2017-01-06 DIAGNOSIS — I5033 Acute on chronic diastolic (congestive) heart failure: Secondary | ICD-10-CM | POA: Diagnosis not present

## 2017-01-06 DIAGNOSIS — E1122 Type 2 diabetes mellitus with diabetic chronic kidney disease: Secondary | ICD-10-CM | POA: Diagnosis not present

## 2017-01-10 ENCOUNTER — Inpatient Hospital Stay (HOSPITAL_COMMUNITY): Admission: RE | Admit: 2017-01-10 | Payer: Medicare Other | Source: Ambulatory Visit

## 2017-01-16 ENCOUNTER — Encounter (HOSPITAL_COMMUNITY)
Admission: RE | Admit: 2017-01-16 | Discharge: 2017-01-16 | Disposition: A | Discharge status: Home / Self Care | Payer: Medicare Other | Source: Ambulatory Visit | Attending: Nephrology | Admitting: Nephrology

## 2017-01-16 DIAGNOSIS — N183 Chronic kidney disease, stage 3 unspecified: Secondary | ICD-10-CM

## 2017-01-16 DIAGNOSIS — D638 Anemia in other chronic diseases classified elsewhere: Secondary | ICD-10-CM | POA: Diagnosis not present

## 2017-01-16 LAB — RENAL FUNCTION PANEL
ALBUMIN: 3.3 g/dL — AB (ref 3.5–5.0)
ANION GAP: 10 (ref 5–15)
BUN: 49 mg/dL — AB (ref 6–20)
CHLORIDE: 107 mmol/L (ref 101–111)
CO2: 25 mmol/L (ref 22–32)
Calcium: 10.1 mg/dL (ref 8.9–10.3)
Creatinine, Ser: 2.75 mg/dL — ABNORMAL HIGH (ref 0.44–1.00)
GFR, EST AFRICAN AMERICAN: 21 mL/min — AB (ref >60)
GFR, EST NON AFRICAN AMERICAN: 18 mL/min — AB (ref >60)
Glucose, Bld: 78 mg/dL (ref 65–99)
PHOSPHORUS: 3.9 mg/dL (ref 2.5–4.6)
POTASSIUM: 4.3 mmol/L (ref 3.5–5.1)
Sodium: 142 mmol/L (ref 135–145)

## 2017-01-16 LAB — POCT HEMOGLOBIN-HEMACUE: HEMOGLOBIN: 10.5 g/dL — AB (ref 12.0–15.0)

## 2017-01-16 LAB — FERRITIN: FERRITIN: 696 ng/mL — AB (ref 11–307)

## 2017-01-16 LAB — IRON AND TIBC
Iron: 53 ug/dL (ref 28–170)
SATURATION RATIOS: 20 % (ref 10.4–31.8)
TIBC: 260 ug/dL (ref 250–450)
UIBC: 207 ug/dL

## 2017-01-16 MED ORDER — DARBEPOETIN ALFA 150 MCG/0.3ML IJ SOSY
120.0000 ug | PREFILLED_SYRINGE | INTRAMUSCULAR | Status: DC
  Administered 2017-01-16: 120 ug via SUBCUTANEOUS

## 2017-01-16 MED ORDER — DARBEPOETIN ALFA 150 MCG/0.3ML IJ SOSY
PREFILLED_SYRINGE | INTRAMUSCULAR | Status: AC
  Filled 2017-01-16: qty 0.3

## 2017-01-20 DIAGNOSIS — I509 Heart failure, unspecified: Secondary | ICD-10-CM | POA: Diagnosis not present

## 2017-01-20 DIAGNOSIS — D509 Iron deficiency anemia, unspecified: Secondary | ICD-10-CM | POA: Diagnosis not present

## 2017-01-20 DIAGNOSIS — N183 Chronic kidney disease, stage 3 (moderate): Secondary | ICD-10-CM | POA: Diagnosis not present

## 2017-01-20 DIAGNOSIS — E559 Vitamin D deficiency, unspecified: Secondary | ICD-10-CM | POA: Diagnosis not present

## 2017-01-20 DIAGNOSIS — I129 Hypertensive chronic kidney disease with stage 1 through stage 4 chronic kidney disease, or unspecified chronic kidney disease: Secondary | ICD-10-CM | POA: Diagnosis not present

## 2017-01-20 DIAGNOSIS — N2581 Secondary hyperparathyroidism of renal origin: Secondary | ICD-10-CM | POA: Diagnosis not present

## 2017-01-20 DIAGNOSIS — N179 Acute kidney failure, unspecified: Secondary | ICD-10-CM | POA: Diagnosis not present

## 2017-01-20 DIAGNOSIS — R809 Proteinuria, unspecified: Secondary | ICD-10-CM | POA: Diagnosis not present

## 2017-01-20 DIAGNOSIS — G4730 Sleep apnea, unspecified: Secondary | ICD-10-CM | POA: Diagnosis not present

## 2017-01-20 DIAGNOSIS — E1129 Type 2 diabetes mellitus with other diabetic kidney complication: Secondary | ICD-10-CM | POA: Diagnosis not present

## 2017-01-20 DIAGNOSIS — Z6841 Body Mass Index (BMI) 40.0 and over, adult: Secondary | ICD-10-CM | POA: Diagnosis not present

## 2017-01-22 ENCOUNTER — Encounter (HOSPITAL_COMMUNITY): Payer: Self-pay | Admitting: Emergency Medicine

## 2017-01-22 ENCOUNTER — Ambulatory Visit (HOSPITAL_COMMUNITY)
Admission: EM | Admit: 2017-01-22 | Discharge: 2017-01-22 | Disposition: A | Discharge status: Home / Self Care | Payer: Medicare Other | Attending: Family Medicine | Admitting: Family Medicine

## 2017-01-22 DIAGNOSIS — J0101 Acute recurrent maxillary sinusitis: Secondary | ICD-10-CM | POA: Diagnosis not present

## 2017-01-22 MED ORDER — AMOXICILLIN-POT CLAVULANATE 875-125 MG PO TABS: 1.0000 | ORAL_TABLET | Freq: Two times a day (BID) | ORAL | 0 refills | Status: DC

## 2017-01-22 NOTE — ED Triage Notes (Signed)
The patient presented to the [facility] with a complaint of bilateral ear pain, sore throat, and facial pressure x 5 days.

## 2017-01-22 NOTE — ED Provider Notes (Signed)
[place]    CSN: [id number] Arrival date & time: 01/22/17  1230     History   Chief Complaint Chief Complaint  Patient presents with  . Otalgia  . Sore Throat    HPI Catherine Williams is a 56 y.o. female.   The patient presented to the [facility] with a complaint of bilateral ear pain, sore throat, and facial pressure x 5 days.  She was treated with z-pak two weeks ago and improved but the symptoms came back several days later.  No cough  Some left sided epistaxis  Patient disabled from chronic back pain      Past Medical History:  Diagnosis Date  . Anemia   . Arthritis Dec. 2014   Gout  . Asthma   . CHF (congestive heart failure) ([place])   . COPD (chronic obstructive pulmonary disease) ([place])   . Diabetes mellitus   . Heart failure   . Hyperlipidemia   . Hypertension   . Kidney disease   . Morbid obesity ([place])   . Peripheral vascular disease ([place])   . Pinched nerve   . Sleep apnea     Patient Active Problem List   Diagnosis Date Noted  . Acute renal failure superimposed on stage 4 chronic kidney disease ([place]) 11/01/2016  . Uncontrolled type 2 diabetes mellitus with hyperglycemia, with long-term current use of insulin ([place]) 11/01/2016  . CHF exacerbation ([place]) 10/31/2016  . Neuropathy ([place]) 10/31/2016  . Diabetes mellitus with complication ([place])   . Shortness of breath 10/23/2016  . Acute on chronic diastolic CHF (congestive heart failure) ([place]) 10/23/2016  . GERD (gastroesophageal reflux disease) 10/23/2016  . Severe obesity (BMI >= 40) ([place]) 08/28/2015  . Dyspnea 08/27/2015  . Type 2 diabetes with nephropathy ([place])   . CKD (chronic kidney disease) stage 3, GFR 30-59 ml/min 07/17/2015  . Diabetes mellitus type 2, controlled ([place]) 07/17/2015  . Chronic anemia 07/17/2015  . Hypertension 07/17/2015  . Acute on chronic diastolic heart failure ([place])   . Aftercare following surgery of the circulatory system, [place] 12/30/2013  . Peripheral vascular  disease, unspecified 05/07/2012  . Visit for wound check 12/19/2011  . OSA (obstructive sleep apnea) 04/05/2011    Past Surgical History:  Procedure Laterality Date  . Atherectomy and angioplasty  10/18/2011   left posterior tibial artery  . HAND SURGERY     right  . OVARY SURGERY     Left  . 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 Authorizing Provider  albuterol (PROVENTIL HFA;VENTOLIN HFA) 108 (90 BASE) MCG/ACT inhaler Inhale 1-2 puffs into the lungs every 6 (six) hours as needed for wheezing. 07/25/13   Harden Mo, MD  allopurinol (ZYLOPRIM) 100 MG tablet Take 1 tablet (100 mg total) by mouth at bedtime. 11/03/16   Doreatha Lew, MD  amoxicillin-clavulanate (AUGMENTIN) 875-125 MG tablet Take 1 tablet by mouth every 12 (twelve) hours. 01/22/17   Robyn Haber, MD  aspirin 81 MG tablet Take 81 mg by mouth daily.      Historical Provider, MD  calcitRIOL (ROCALTROL) 0.25 MCG capsule Take 0.25 mcg by mouth daily.    Historical Provider, MD  cyclobenzaprine (FLEXERIL) 10 MG tablet TAKE ONE (1) TABLET BY MOUTH 3 TIMES DAILY AS NEEDED FOR  SPASM 11/28/16   Jessy Oto, MD  Darbepoetin Alfa (ARANESP) 60 MCG/0.3ML SOSY injection Inject 0.3 mLs (60 mcg total) into the skin every Thursday at 6pm. 11/03/16   Doreatha Lew, MD  ferrous sulfate 325 (65 FE) MG tablet Take 1 tablet (325 mg total) by mouth 2 (two) times daily with a meal. 11/03/16   Doreatha Lew, MD  furosemide (LASIX) 80 MG tablet Take 1 tablet (80 mg total) by mouth 2 (two) times daily. 11/03/16   Doreatha Lew, MD  hydrALAZINE (APRESOLINE) 25 MG tablet Take 1 tablet (25 mg total) by mouth 2 (two) times daily. 07/21/15   Barton Dubois, MD  insulin aspart (NOVOLOG) 100 UNIT/ML injection Inject 20 Units into the skin 3 (three) times daily with meals.      Historical Provider, MD  insulin glargine (LANTUS) 100 UNIT/ML injection Inject 60 Units into the skin at bedtime.     Historical Provider, MD  latanoprost (XALATAN) 0.005 % ophthalmic solution Place 1 drop into both eyes at bedtime.    Historical Provider, MD  metolazone (ZAROXOLYN) 2.5 MG tablet Take 1 tablet (2.5 mg total) by mouth 2 (two) times daily. 11/03/16   Doreatha Lew, MD  metoprolol (LOPRESSOR) 50 MG tablet Take 25 mg by mouth 2 (two) times daily.     Historical Provider, MD  Omega-3 Fatty Acids (FISH OIL PO) Take 1 capsule by mouth daily.    Historical Provider, MD  pantoprazole (PROTONIX) 40 MG tablet Take 1 tablet (40 mg total) by mouth daily. 07/21/15   Barton Dubois, MD  rosuvastatin (CRESTOR) 40 MG tablet Take 40 mg by mouth every evening.     Historical Provider, MD    Family History Family History  Problem Relation Age of Onset  . Lung cancer Mother   . Clotting disorder Mother   . Heart disease Mother   . Cancer Mother     Lung  . Deep vein thrombosis Mother   . Diabetes Mother   . Hypertension Mother   . Varicose Veins Mother   . Cancer Father   . Clotting disorder Sister   . Heart attack Sister   . Diabetes Sister   . Clotting disorder Brother   . Deep vein thrombosis Brother   . Diabetes Brother   . Heart disease Brother   . Heart disease Sister     Social History Social History  Substance Use Topics  . Smoking status: Never Smoker  . Smokeless tobacco: Never Used  . Alcohol use No     Allergies   Omnipaque [iohexol]   Review of Systems Review of Systems  Constitutional: Negative.   HENT: Positive for congestion, ear pain, nosebleeds, sinus pressure and sneezing.   Respiratory: Negative.   Gastrointestinal: Negative.   Genitourinary: Negative.      Physical Exam Triage Vital Signs ED Triage Vitals  Enc Vitals Group     BP 01/22/17 1255 158/60     Pulse Rate 01/22/17 1255 73     Resp 01/22/17 1255 20     Temp 01/22/17 1255 98.8  F (37.1 C)     Temp Source 01/22/17 1255 Oral     SpO2 01/22/17 1255 98 %     Weight --      Height --      Head Circumference --      Peak Flow --      Pain Score 01/22/17 1254 8     Pain Loc --  Pain Edu? --      Excl. in [place]? --    No data found.   Updated Vital Signs BP 158/60 (BP Location: Right Arm)   Pulse 73   Temp 98.8 F (37.1 C) (Oral)   Resp 20   SpO2 98%      Physical Exam  Constitutional: She is oriented to person, place, and time. She appears well-developed and well-nourished.  HENT:  Head: Normocephalic.  Right Ear: External ear normal.  Left Ear: External ear normal.  Mouth/Throat: Oropharynx is clear and moist.  Mucopurulent nasal discharge and dried blood left nasal passage.  Eyes: Conjunctivae and EOM are normal. Pupils are equal, round, and reactive to light.  Neck: Normal range of motion. Neck supple.  Pulmonary/Chest: Effort normal.  Musculoskeletal: Normal range of motion.  Neurological: She is alert and oriented to person, place, and time.  Skin: Skin is warm and dry.  Nursing note and vitals reviewed.    UC Treatments / Results  Labs (all labs ordered are listed, but only abnormal results are displayed) Labs Reviewed - No data to display  EKG  EKG Interpretation None       Radiology No results found.  Procedures Procedures (including critical care time)  Medications Ordered in UC Medications - No data to display   Initial Impression / Assessment and Plan / UC Course  I have reviewed the triage vital signs and the nursing notes.  Pertinent labs & imaging results that were available during my care of the patient were reviewed by me and considered in my medical decision making (see chart for details).     Final Clinical Impressions(s) / UC Diagnoses   Final diagnoses:  Acute recurrent maxillary sinusitis    New Prescriptions New Prescriptions   AMOXICILLIN-CLAVULANATE (AUGMENTIN) 875-125 MG TABLET    Take 1  tablet by mouth every 12 (twelve) hours.     Robyn Haber, MD 01/22/17 1316

## 2017-01-24 DIAGNOSIS — G4733 Obstructive sleep apnea (adult) (pediatric): Secondary | ICD-10-CM | POA: Diagnosis not present

## 2017-01-24 DIAGNOSIS — J45909 Unspecified asthma, uncomplicated: Secondary | ICD-10-CM | POA: Diagnosis not present

## 2017-01-24 DIAGNOSIS — J449 Chronic obstructive pulmonary disease, unspecified: Secondary | ICD-10-CM | POA: Diagnosis not present

## 2017-01-25 ENCOUNTER — Encounter: Payer: Self-pay | Admitting: Pulmonary Disease

## 2017-01-25 DIAGNOSIS — J449 Chronic obstructive pulmonary disease, unspecified: Secondary | ICD-10-CM | POA: Diagnosis not present

## 2017-01-25 DIAGNOSIS — J45909 Unspecified asthma, uncomplicated: Secondary | ICD-10-CM | POA: Diagnosis not present

## 2017-01-25 DIAGNOSIS — G4733 Obstructive sleep apnea (adult) (pediatric): Secondary | ICD-10-CM | POA: Diagnosis not present

## 2017-01-30 ENCOUNTER — Encounter (HOSPITAL_COMMUNITY)
Admission: RE | Admit: 2017-01-30 | Discharge: 2017-01-30 | Disposition: A | Discharge status: Home / Self Care | Payer: Medicare Other | Source: Ambulatory Visit | Attending: Nephrology | Admitting: Nephrology

## 2017-01-30 DIAGNOSIS — N183 Chronic kidney disease, stage 3 unspecified: Secondary | ICD-10-CM

## 2017-01-30 DIAGNOSIS — D638 Anemia in other chronic diseases classified elsewhere: Secondary | ICD-10-CM | POA: Diagnosis not present

## 2017-01-30 LAB — CBC
HEMATOCRIT: 33.5 % — AB (ref 36.0–46.0)
HEMOGLOBIN: 10.1 g/dL — AB (ref 12.0–15.0)
MCH: 24.8 pg — AB (ref 26.0–34.0)
MCHC: 30.1 g/dL (ref 30.0–36.0)
MCV: 82.1 fL (ref 78.0–100.0)
Platelets: 282 10*3/uL (ref 150–400)
RBC: 4.08 MIL/uL (ref 3.87–5.11)
RDW: 19.6 % — ABNORMAL HIGH (ref 11.5–15.5)
WBC: 10.4 10*3/uL (ref 4.0–10.5)

## 2017-01-30 MED ORDER — DARBEPOETIN ALFA 150 MCG/0.3ML IJ SOSY
PREFILLED_SYRINGE | INTRAMUSCULAR | Status: AC
  Filled 2017-01-30: qty 0.3

## 2017-01-30 MED ORDER — DARBEPOETIN ALFA 150 MCG/0.3ML IJ SOSY
120.0000 ug | PREFILLED_SYRINGE | INTRAMUSCULAR | Status: DC
  Administered 2017-01-30: 120 ug via SUBCUTANEOUS

## 2017-02-02 ENCOUNTER — Telehealth: Payer: Self-pay | Admitting: Pulmonary Disease

## 2017-02-02 NOTE — Telephone Encounter (Signed)
ONO showed no significant desat on cpap with 2L o2.

## 2017-02-13 ENCOUNTER — Other Ambulatory Visit (HOSPITAL_COMMUNITY): Payer: Self-pay | Admitting: *Deleted

## 2017-02-14 ENCOUNTER — Encounter (HOSPITAL_COMMUNITY)
Admission: RE | Admit: 2017-02-14 | Discharge: 2017-02-14 | Disposition: A | Discharge status: Home / Self Care | Payer: Medicare Other | Source: Ambulatory Visit | Attending: Nephrology | Admitting: Nephrology

## 2017-02-14 DIAGNOSIS — D638 Anemia in other chronic diseases classified elsewhere: Secondary | ICD-10-CM | POA: Diagnosis not present

## 2017-02-14 DIAGNOSIS — N183 Chronic kidney disease, stage 3 unspecified: Secondary | ICD-10-CM

## 2017-02-14 LAB — POCT HEMOGLOBIN-HEMACUE: HEMOGLOBIN: 10.1 g/dL — AB (ref 12.0–15.0)

## 2017-02-14 MED ORDER — DARBEPOETIN ALFA 150 MCG/0.3ML IJ SOSY
120.0000 ug | PREFILLED_SYRINGE | INTRAMUSCULAR | Status: DC
  Administered 2017-02-14: 120 ug via SUBCUTANEOUS

## 2017-02-14 MED ORDER — DARBEPOETIN ALFA 150 MCG/0.3ML IJ SOSY
PREFILLED_SYRINGE | INTRAMUSCULAR | Status: AC
  Filled 2017-02-14: qty 0.3

## 2017-02-28 ENCOUNTER — Encounter (HOSPITAL_COMMUNITY)
Admission: RE | Admit: 2017-02-28 | Discharge: 2017-02-28 | Disposition: A | Discharge status: Home / Self Care | Payer: Medicare Other | Source: Ambulatory Visit | Attending: Nephrology | Admitting: Nephrology

## 2017-02-28 DIAGNOSIS — D638 Anemia in other chronic diseases classified elsewhere: Secondary | ICD-10-CM | POA: Diagnosis not present

## 2017-02-28 DIAGNOSIS — N183 Chronic kidney disease, stage 3 unspecified: Secondary | ICD-10-CM

## 2017-02-28 DIAGNOSIS — J449 Chronic obstructive pulmonary disease, unspecified: Secondary | ICD-10-CM | POA: Diagnosis not present

## 2017-02-28 DIAGNOSIS — N184 Chronic kidney disease, stage 4 (severe): Secondary | ICD-10-CM | POA: Diagnosis not present

## 2017-02-28 DIAGNOSIS — E1140 Type 2 diabetes mellitus with diabetic neuropathy, unspecified: Secondary | ICD-10-CM | POA: Diagnosis not present

## 2017-02-28 DIAGNOSIS — I10 Essential (primary) hypertension: Secondary | ICD-10-CM | POA: Diagnosis not present

## 2017-02-28 DIAGNOSIS — Z6841 Body Mass Index (BMI) 40.0 and over, adult: Secondary | ICD-10-CM | POA: Diagnosis not present

## 2017-02-28 DIAGNOSIS — E784 Other hyperlipidemia: Secondary | ICD-10-CM | POA: Diagnosis not present

## 2017-02-28 LAB — CBC
HCT: 36.3 % (ref 36.0–46.0)
Hemoglobin: 10.7 g/dL — ABNORMAL LOW (ref 12.0–15.0)
MCH: 24.9 pg — ABNORMAL LOW (ref 26.0–34.0)
MCHC: 29.5 g/dL — ABNORMAL LOW (ref 30.0–36.0)
MCV: 84.4 fL (ref 78.0–100.0)
PLATELETS: 272 10*3/uL (ref 150–400)
RBC: 4.3 MIL/uL (ref 3.87–5.11)
RDW: 18.8 % — ABNORMAL HIGH (ref 11.5–15.5)
WBC: 8 10*3/uL (ref 4.0–10.5)

## 2017-02-28 LAB — RENAL FUNCTION PANEL
ANION GAP: 10 (ref 5–15)
Albumin: 3.5 g/dL (ref 3.5–5.0)
BUN: 42 mg/dL — ABNORMAL HIGH (ref 6–20)
CHLORIDE: 105 mmol/L (ref 101–111)
CO2: 28 mmol/L (ref 22–32)
Calcium: 9.9 mg/dL (ref 8.9–10.3)
Creatinine, Ser: 2.34 mg/dL — ABNORMAL HIGH (ref 0.44–1.00)
GFR, EST AFRICAN AMERICAN: 25 mL/min — AB (ref >60)
GFR, EST NON AFRICAN AMERICAN: 22 mL/min — AB (ref >60)
Glucose, Bld: 64 mg/dL — ABNORMAL LOW (ref 65–99)
Phosphorus: 3.4 mg/dL (ref 2.5–4.6)
Potassium: 4.3 mmol/L (ref 3.5–5.1)
Sodium: 143 mmol/L (ref 135–145)

## 2017-02-28 MED ORDER — DARBEPOETIN ALFA 150 MCG/0.3ML IJ SOSY: 120.0000 ug | PREFILLED_SYRINGE | INTRAMUSCULAR | Status: DC

## 2017-02-28 MED ORDER — DARBEPOETIN ALFA 60 MCG/0.3ML IJ SOSY
PREFILLED_SYRINGE | INTRAMUSCULAR | Status: AC
  Administered 2017-02-28: 120 ug
  Filled 2017-02-28: qty 0.6

## 2017-03-01 LAB — PTH, INTACT AND CALCIUM
CALCIUM TOTAL (PTH): 10 mg/dL (ref 8.7–10.2)
PTH: 264 pg/mL — ABNORMAL HIGH (ref 15–65)

## 2017-03-14 ENCOUNTER — Encounter (HOSPITAL_COMMUNITY)
Admission: RE | Admit: 2017-03-14 | Discharge: 2017-03-14 | Disposition: A | Discharge status: Home / Self Care | Payer: Medicare Other | Source: Ambulatory Visit | Attending: Nephrology | Admitting: Nephrology

## 2017-03-14 DIAGNOSIS — N183 Chronic kidney disease, stage 3 unspecified: Secondary | ICD-10-CM

## 2017-03-14 DIAGNOSIS — D638 Anemia in other chronic diseases classified elsewhere: Secondary | ICD-10-CM | POA: Diagnosis not present

## 2017-03-14 LAB — IRON AND TIBC
Iron: 47 ug/dL (ref 28–170)
SATURATION RATIOS: 20 % (ref 10.4–31.8)
TIBC: 238 ug/dL — ABNORMAL LOW (ref 250–450)
UIBC: 191 ug/dL

## 2017-03-14 LAB — POCT HEMOGLOBIN-HEMACUE: HEMOGLOBIN: 10.8 g/dL — AB (ref 12.0–15.0)

## 2017-03-14 LAB — FERRITIN: Ferritin: 581 ng/mL — ABNORMAL HIGH (ref 11–307)

## 2017-03-14 MED ORDER — DARBEPOETIN ALFA 60 MCG/0.3ML IJ SOSY
PREFILLED_SYRINGE | INTRAMUSCULAR | Status: AC
  Administered 2017-03-14: 14:00:00 120 ug
  Filled 2017-03-14: qty 0.6

## 2017-03-14 MED ORDER — DARBEPOETIN ALFA 150 MCG/0.3ML IJ SOSY: 120.0000 ug | PREFILLED_SYRINGE | INTRAMUSCULAR | Status: DC

## 2017-03-28 ENCOUNTER — Encounter (HOSPITAL_COMMUNITY)
Admission: RE | Admit: 2017-03-28 | Discharge: 2017-03-28 | Disposition: A | Discharge status: Home / Self Care | Payer: Medicare Other | Source: Ambulatory Visit | Attending: Nephrology | Admitting: Nephrology

## 2017-03-28 DIAGNOSIS — D638 Anemia in other chronic diseases classified elsewhere: Secondary | ICD-10-CM | POA: Insufficient documentation

## 2017-03-28 DIAGNOSIS — N183 Chronic kidney disease, stage 3 unspecified: Secondary | ICD-10-CM

## 2017-03-28 LAB — RENAL FUNCTION PANEL
ANION GAP: 11 (ref 5–15)
Albumin: 3.3 g/dL — ABNORMAL LOW (ref 3.5–5.0)
BUN: 61 mg/dL — ABNORMAL HIGH (ref 6–20)
CO2: 28 mmol/L (ref 22–32)
Calcium: 9.9 mg/dL (ref 8.9–10.3)
Chloride: 105 mmol/L (ref 101–111)
Creatinine, Ser: 2.96 mg/dL — ABNORMAL HIGH (ref 0.44–1.00)
GFR calc non Af Amer: 16 mL/min — ABNORMAL LOW (ref >60)
GFR, EST AFRICAN AMERICAN: 19 mL/min — AB (ref >60)
GLUCOSE: 79 mg/dL (ref 65–99)
PHOSPHORUS: 4 mg/dL (ref 2.5–4.6)
POTASSIUM: 4.4 mmol/L (ref 3.5–5.1)
Sodium: 144 mmol/L (ref 135–145)

## 2017-03-28 LAB — CBC
HEMATOCRIT: 35.3 % — AB (ref 36.0–46.0)
HEMOGLOBIN: 10.6 g/dL — AB (ref 12.0–15.0)
MCH: 25.2 pg — AB (ref 26.0–34.0)
MCHC: 30 g/dL (ref 30.0–36.0)
MCV: 83.8 fL (ref 78.0–100.0)
Platelets: 245 10*3/uL (ref 150–400)
RBC: 4.21 MIL/uL (ref 3.87–5.11)
RDW: 16.9 % — ABNORMAL HIGH (ref 11.5–15.5)
WBC: 9.1 10*3/uL (ref 4.0–10.5)

## 2017-03-28 MED ORDER — DARBEPOETIN ALFA 60 MCG/0.3ML IJ SOSY
PREFILLED_SYRINGE | INTRAMUSCULAR | Status: AC
  Administered 2017-03-28: 60 ug via SUBCUTANEOUS
  Filled 2017-03-28: qty 0.6

## 2017-03-28 MED ORDER — DARBEPOETIN ALFA 150 MCG/0.3ML IJ SOSY: 120.0000 ug | PREFILLED_SYRINGE | INTRAMUSCULAR | Status: DC

## 2017-03-28 MED ORDER — DARBEPOETIN ALFA 60 MCG/0.3ML IJ SOSY
PREFILLED_SYRINGE | INTRAMUSCULAR | Status: AC
  Administered 2017-03-28: 60 ug via SUBCUTANEOUS
  Filled 2017-03-28: qty 0.3

## 2017-04-11 ENCOUNTER — Inpatient Hospital Stay (HOSPITAL_COMMUNITY): Admission: RE | Admit: 2017-04-11 | Payer: Medicare Other | Source: Ambulatory Visit

## 2017-04-21 ENCOUNTER — Encounter (HOSPITAL_COMMUNITY)
Admission: RE | Admit: 2017-04-21 | Discharge: 2017-04-21 | Disposition: A | Discharge status: Home / Self Care | Payer: Medicare Other | Source: Ambulatory Visit | Attending: Nephrology | Admitting: Nephrology

## 2017-04-21 DIAGNOSIS — N183 Chronic kidney disease, stage 3 unspecified: Secondary | ICD-10-CM

## 2017-04-21 DIAGNOSIS — D638 Anemia in other chronic diseases classified elsewhere: Secondary | ICD-10-CM | POA: Diagnosis not present

## 2017-04-21 LAB — POCT HEMOGLOBIN-HEMACUE: Hemoglobin: 10.7 g/dL — ABNORMAL LOW (ref 12.0–15.0)

## 2017-04-21 MED ORDER — DARBEPOETIN ALFA 60 MCG/0.3ML IJ SOSY
PREFILLED_SYRINGE | INTRAMUSCULAR | Status: AC
  Administered 2017-04-21: 120 ug via SUBCUTANEOUS
  Filled 2017-04-21: qty 0.6

## 2017-04-21 MED ORDER — DARBEPOETIN ALFA 150 MCG/0.3ML IJ SOSY: 120.0000 ug | PREFILLED_SYRINGE | INTRAMUSCULAR | Status: DC

## 2017-04-28 DIAGNOSIS — E559 Vitamin D deficiency, unspecified: Secondary | ICD-10-CM | POA: Diagnosis not present

## 2017-04-28 DIAGNOSIS — N183 Chronic kidney disease, stage 3 (moderate): Secondary | ICD-10-CM | POA: Diagnosis not present

## 2017-04-28 DIAGNOSIS — D509 Iron deficiency anemia, unspecified: Secondary | ICD-10-CM | POA: Diagnosis not present

## 2017-04-28 DIAGNOSIS — R809 Proteinuria, unspecified: Secondary | ICD-10-CM | POA: Diagnosis not present

## 2017-04-28 DIAGNOSIS — I509 Heart failure, unspecified: Secondary | ICD-10-CM | POA: Diagnosis not present

## 2017-04-28 DIAGNOSIS — N2581 Secondary hyperparathyroidism of renal origin: Secondary | ICD-10-CM | POA: Diagnosis not present

## 2017-04-28 DIAGNOSIS — E1129 Type 2 diabetes mellitus with other diabetic kidney complication: Secondary | ICD-10-CM | POA: Diagnosis not present

## 2017-04-28 DIAGNOSIS — I129 Hypertensive chronic kidney disease with stage 1 through stage 4 chronic kidney disease, or unspecified chronic kidney disease: Secondary | ICD-10-CM | POA: Diagnosis not present

## 2017-04-28 DIAGNOSIS — G4730 Sleep apnea, unspecified: Secondary | ICD-10-CM | POA: Diagnosis not present

## 2017-04-28 DIAGNOSIS — N184 Chronic kidney disease, stage 4 (severe): Secondary | ICD-10-CM | POA: Diagnosis not present

## 2017-04-28 DIAGNOSIS — Z6841 Body Mass Index (BMI) 40.0 and over, adult: Secondary | ICD-10-CM | POA: Diagnosis not present

## 2017-04-28 DIAGNOSIS — N179 Acute kidney failure, unspecified: Secondary | ICD-10-CM | POA: Diagnosis not present

## 2017-05-04 ENCOUNTER — Other Ambulatory Visit (HOSPITAL_COMMUNITY): Payer: Self-pay | Admitting: *Deleted

## 2017-05-05 ENCOUNTER — Encounter (HOSPITAL_COMMUNITY): Payer: Medicare Other

## 2017-05-05 ENCOUNTER — Encounter (HOSPITAL_COMMUNITY)
Admission: RE | Admit: 2017-05-05 | Discharge: 2017-05-05 | Disposition: A | Discharge status: Home / Self Care | Payer: Medicare Other | Source: Ambulatory Visit | Attending: Nephrology | Admitting: Nephrology

## 2017-05-05 DIAGNOSIS — N184 Chronic kidney disease, stage 4 (severe): Secondary | ICD-10-CM | POA: Insufficient documentation

## 2017-05-05 DIAGNOSIS — N183 Chronic kidney disease, stage 3 unspecified: Secondary | ICD-10-CM

## 2017-05-05 DIAGNOSIS — D631 Anemia in chronic kidney disease: Secondary | ICD-10-CM | POA: Diagnosis not present

## 2017-05-05 LAB — RENAL FUNCTION PANEL
Albumin: 3.3 g/dL — ABNORMAL LOW (ref 3.5–5.0)
Anion gap: 9 (ref 5–15)
BUN: 51 mg/dL — AB (ref 6–20)
CALCIUM: 9.5 mg/dL (ref 8.9–10.3)
CHLORIDE: 105 mmol/L (ref 101–111)
CO2: 28 mmol/L (ref 22–32)
CREATININE: 2.86 mg/dL — AB (ref 0.44–1.00)
GFR calc non Af Amer: 17 mL/min — ABNORMAL LOW (ref >60)
GFR, EST AFRICAN AMERICAN: 20 mL/min — AB (ref >60)
GLUCOSE: 138 mg/dL — AB (ref 65–99)
Phosphorus: 4 mg/dL (ref 2.5–4.6)
Potassium: 4.3 mmol/L (ref 3.5–5.1)
SODIUM: 142 mmol/L (ref 135–145)

## 2017-05-05 LAB — CBC
HCT: 36.6 % (ref 36.0–46.0)
Hemoglobin: 10.6 g/dL — ABNORMAL LOW (ref 12.0–15.0)
MCH: 24.5 pg — AB (ref 26.0–34.0)
MCHC: 29 g/dL — AB (ref 30.0–36.0)
MCV: 84.5 fL (ref 78.0–100.0)
PLATELETS: 250 10*3/uL (ref 150–400)
RBC: 4.33 MIL/uL (ref 3.87–5.11)
RDW: 16.6 % — ABNORMAL HIGH (ref 11.5–15.5)
WBC: 8.5 10*3/uL (ref 4.0–10.5)

## 2017-05-05 MED ORDER — DARBEPOETIN ALFA 150 MCG/0.3ML IJ SOSY: 120.0000 ug | PREFILLED_SYRINGE | INTRAMUSCULAR | Status: DC

## 2017-05-05 MED ORDER — FERUMOXYTOL INJECTION 510 MG/17 ML
510.0000 mg | Freq: Once | INTRAVENOUS | Status: AC
  Administered 2017-05-05: 510 mg via INTRAVENOUS
  Filled 2017-05-05: qty 17

## 2017-05-05 MED ORDER — DARBEPOETIN ALFA 150 MCG/0.3ML IJ SOSY
PREFILLED_SYRINGE | INTRAMUSCULAR | Status: AC
  Administered 2017-05-05: 10:00:00 150 ug
  Filled 2017-05-05: qty 0.3

## 2017-05-09 DIAGNOSIS — Z6841 Body Mass Index (BMI) 40.0 and over, adult: Secondary | ICD-10-CM | POA: Diagnosis not present

## 2017-05-09 DIAGNOSIS — I509 Heart failure, unspecified: Secondary | ICD-10-CM | POA: Diagnosis not present

## 2017-05-09 DIAGNOSIS — N184 Chronic kidney disease, stage 4 (severe): Secondary | ICD-10-CM | POA: Diagnosis not present

## 2017-05-09 DIAGNOSIS — E1140 Type 2 diabetes mellitus with diabetic neuropathy, unspecified: Secondary | ICD-10-CM | POA: Diagnosis not present

## 2017-05-09 DIAGNOSIS — I10 Essential (primary) hypertension: Secondary | ICD-10-CM | POA: Diagnosis not present

## 2017-05-09 DIAGNOSIS — J302 Other seasonal allergic rhinitis: Secondary | ICD-10-CM | POA: Diagnosis not present

## 2017-05-19 ENCOUNTER — Encounter (HOSPITAL_COMMUNITY)
Admission: RE | Admit: 2017-05-19 | Discharge: 2017-05-19 | Disposition: A | Discharge status: Home / Self Care | Payer: Medicare Other | Source: Ambulatory Visit | Attending: Nephrology | Admitting: Nephrology

## 2017-05-19 DIAGNOSIS — N183 Chronic kidney disease, stage 3 unspecified: Secondary | ICD-10-CM

## 2017-05-19 DIAGNOSIS — D638 Anemia in other chronic diseases classified elsewhere: Secondary | ICD-10-CM | POA: Insufficient documentation

## 2017-05-19 LAB — IRON AND TIBC
Iron: 39 ug/dL (ref 28–170)
Saturation Ratios: 17 % (ref 10.4–31.8)
TIBC: 232 ug/dL — ABNORMAL LOW (ref 250–450)
UIBC: 193 ug/dL

## 2017-05-19 LAB — FERRITIN: Ferritin: 584 ng/mL — ABNORMAL HIGH (ref 11–307)

## 2017-05-19 LAB — POCT HEMOGLOBIN-HEMACUE: Hemoglobin: 12 g/dL (ref 12.0–15.0)

## 2017-05-19 MED ORDER — DARBEPOETIN ALFA 150 MCG/0.3ML IJ SOSY
PREFILLED_SYRINGE | INTRAMUSCULAR | Status: AC
  Filled 2017-05-19: qty 0.3

## 2017-05-19 MED ORDER — DARBEPOETIN ALFA 150 MCG/0.3ML IJ SOSY: 120.0000 ug | PREFILLED_SYRINGE | INTRAMUSCULAR | Status: DC

## 2017-06-02 ENCOUNTER — Encounter (HOSPITAL_COMMUNITY)
Admission: RE | Admit: 2017-06-02 | Discharge: 2017-06-02 | Disposition: A | Discharge status: Home / Self Care | Payer: Medicare Other | Source: Ambulatory Visit | Attending: Nephrology | Admitting: Nephrology

## 2017-06-02 DIAGNOSIS — D638 Anemia in other chronic diseases classified elsewhere: Secondary | ICD-10-CM | POA: Diagnosis not present

## 2017-06-02 DIAGNOSIS — N183 Chronic kidney disease, stage 3 unspecified: Secondary | ICD-10-CM

## 2017-06-02 LAB — CBC
HCT: 37.2 % (ref 36.0–46.0)
HEMOGLOBIN: 10.9 g/dL — AB (ref 12.0–15.0)
MCH: 24.5 pg — ABNORMAL LOW (ref 26.0–34.0)
MCHC: 29.3 g/dL — AB (ref 30.0–36.0)
MCV: 83.6 fL (ref 78.0–100.0)
Platelets: 222 10*3/uL (ref 150–400)
RBC: 4.45 MIL/uL (ref 3.87–5.11)
RDW: 17.3 % — ABNORMAL HIGH (ref 11.5–15.5)
WBC: 9.3 10*3/uL (ref 4.0–10.5)

## 2017-06-02 LAB — RENAL FUNCTION PANEL
ALBUMIN: 3.2 g/dL — AB (ref 3.5–5.0)
ANION GAP: 9 (ref 5–15)
BUN: 64 mg/dL — ABNORMAL HIGH (ref 6–20)
CALCIUM: 9.6 mg/dL (ref 8.9–10.3)
CO2: 26 mmol/L (ref 22–32)
Chloride: 105 mmol/L (ref 101–111)
Creatinine, Ser: 2.97 mg/dL — ABNORMAL HIGH (ref 0.44–1.00)
GFR, EST AFRICAN AMERICAN: 19 mL/min — AB (ref >60)
GFR, EST NON AFRICAN AMERICAN: 16 mL/min — AB (ref >60)
GLUCOSE: 110 mg/dL — AB (ref 65–99)
PHOSPHORUS: 4.1 mg/dL (ref 2.5–4.6)
Potassium: 4.2 mmol/L (ref 3.5–5.1)
SODIUM: 140 mmol/L (ref 135–145)

## 2017-06-02 MED ORDER — DARBEPOETIN ALFA 150 MCG/0.3ML IJ SOSY
120.0000 ug | PREFILLED_SYRINGE | INTRAMUSCULAR | Status: DC
  Administered 2017-06-02: 120 ug via SUBCUTANEOUS

## 2017-06-02 MED ORDER — DARBEPOETIN ALFA 150 MCG/0.3ML IJ SOSY
PREFILLED_SYRINGE | INTRAMUSCULAR | Status: AC
  Filled 2017-06-02: qty 0.3

## 2017-06-16 ENCOUNTER — Encounter (HOSPITAL_COMMUNITY)
Admission: RE | Admit: 2017-06-16 | Discharge: 2017-06-16 | Disposition: A | Discharge status: Home / Self Care | Payer: Medicare Other | Source: Ambulatory Visit | Attending: Nephrology | Admitting: Nephrology

## 2017-06-16 DIAGNOSIS — N183 Chronic kidney disease, stage 3 unspecified: Secondary | ICD-10-CM

## 2017-06-16 DIAGNOSIS — D638 Anemia in other chronic diseases classified elsewhere: Secondary | ICD-10-CM | POA: Diagnosis not present

## 2017-06-16 LAB — POCT HEMOGLOBIN-HEMACUE: HEMOGLOBIN: 11.2 g/dL — AB (ref 12.0–15.0)

## 2017-06-16 MED ORDER — DARBEPOETIN ALFA 150 MCG/0.3ML IJ SOSY
120.0000 ug | PREFILLED_SYRINGE | INTRAMUSCULAR | Status: DC
  Administered 2017-06-16: 09:00:00 120 ug via SUBCUTANEOUS

## 2017-06-16 MED ORDER — DARBEPOETIN ALFA 60 MCG/0.3ML IJ SOSY
PREFILLED_SYRINGE | INTRAMUSCULAR | Status: AC
  Filled 2017-06-16: qty 0.6

## 2017-06-17 LAB — PTH, INTACT AND CALCIUM
Calcium, Total (PTH): 9.7 mg/dL (ref 8.7–10.2)
PTH: 329 pg/mL — ABNORMAL HIGH (ref 15–65)

## 2017-06-19 MED FILL — Darbepoetin Alfa Soln Prefilled Syringe 60 MCG/0.3ML: INTRAMUSCULAR | Qty: 0.6 | Status: AC

## 2017-06-28 DIAGNOSIS — I509 Heart failure, unspecified: Secondary | ICD-10-CM | POA: Diagnosis not present

## 2017-06-28 DIAGNOSIS — N179 Acute kidney failure, unspecified: Secondary | ICD-10-CM | POA: Diagnosis not present

## 2017-06-28 DIAGNOSIS — E1129 Type 2 diabetes mellitus with other diabetic kidney complication: Secondary | ICD-10-CM | POA: Diagnosis not present

## 2017-06-28 DIAGNOSIS — N2581 Secondary hyperparathyroidism of renal origin: Secondary | ICD-10-CM | POA: Diagnosis not present

## 2017-06-28 DIAGNOSIS — R809 Proteinuria, unspecified: Secondary | ICD-10-CM | POA: Diagnosis not present

## 2017-06-28 DIAGNOSIS — G4730 Sleep apnea, unspecified: Secondary | ICD-10-CM | POA: Diagnosis not present

## 2017-06-28 DIAGNOSIS — N184 Chronic kidney disease, stage 4 (severe): Secondary | ICD-10-CM | POA: Diagnosis not present

## 2017-06-28 DIAGNOSIS — I129 Hypertensive chronic kidney disease with stage 1 through stage 4 chronic kidney disease, or unspecified chronic kidney disease: Secondary | ICD-10-CM | POA: Diagnosis not present

## 2017-06-28 DIAGNOSIS — Z6841 Body Mass Index (BMI) 40.0 and over, adult: Secondary | ICD-10-CM | POA: Diagnosis not present

## 2017-06-28 DIAGNOSIS — D509 Iron deficiency anemia, unspecified: Secondary | ICD-10-CM | POA: Diagnosis not present

## 2017-06-28 DIAGNOSIS — R7989 Other specified abnormal findings of blood chemistry: Secondary | ICD-10-CM | POA: Diagnosis not present

## 2017-06-28 DIAGNOSIS — N183 Chronic kidney disease, stage 3 (moderate): Secondary | ICD-10-CM | POA: Diagnosis not present

## 2017-06-30 ENCOUNTER — Encounter (HOSPITAL_COMMUNITY)
Admission: RE | Admit: 2017-06-30 | Discharge: 2017-06-30 | Disposition: A | Discharge status: Home / Self Care | Payer: Medicare Other | Source: Ambulatory Visit | Attending: Nephrology | Admitting: Nephrology

## 2017-06-30 DIAGNOSIS — N183 Chronic kidney disease, stage 3 unspecified: Secondary | ICD-10-CM

## 2017-06-30 DIAGNOSIS — D638 Anemia in other chronic diseases classified elsewhere: Secondary | ICD-10-CM | POA: Insufficient documentation

## 2017-06-30 LAB — CBC
HCT: 37 % (ref 36.0–46.0)
Hemoglobin: 10.9 g/dL — ABNORMAL LOW (ref 12.0–15.0)
MCH: 24.7 pg — ABNORMAL LOW (ref 26.0–34.0)
MCHC: 29.5 g/dL — ABNORMAL LOW (ref 30.0–36.0)
MCV: 83.9 fL (ref 78.0–100.0)
Platelets: 230 K/uL (ref 150–400)
RBC: 4.41 MIL/uL (ref 3.87–5.11)
RDW: 17.7 % — ABNORMAL HIGH (ref 11.5–15.5)
WBC: 8.3 K/uL (ref 4.0–10.5)

## 2017-06-30 LAB — RENAL FUNCTION PANEL
Albumin: 3.3 g/dL — ABNORMAL LOW (ref 3.5–5.0)
Anion gap: 9 (ref 5–15)
BUN: 60 mg/dL — ABNORMAL HIGH (ref 6–20)
CALCIUM: 9.8 mg/dL (ref 8.9–10.3)
CHLORIDE: 102 mmol/L (ref 101–111)
CO2: 26 mmol/L (ref 22–32)
CREATININE: 3.17 mg/dL — AB (ref 0.44–1.00)
GFR calc non Af Amer: 15 mL/min — ABNORMAL LOW (ref >60)
GFR, EST AFRICAN AMERICAN: 17 mL/min — AB (ref >60)
GLUCOSE: 149 mg/dL — AB (ref 65–99)
Phosphorus: 4.3 mg/dL (ref 2.5–4.6)
Potassium: 4.2 mmol/L (ref 3.5–5.1)
SODIUM: 137 mmol/L (ref 135–145)

## 2017-06-30 MED ORDER — DARBEPOETIN ALFA 150 MCG/0.3ML IJ SOSY: 120.0000 ug | PREFILLED_SYRINGE | INTRAMUSCULAR | Status: DC

## 2017-06-30 MED ORDER — DARBEPOETIN ALFA 60 MCG/0.3ML IJ SOSY
PREFILLED_SYRINGE | INTRAMUSCULAR | Status: AC
  Administered 2017-06-30: 120 ug
  Filled 2017-06-30: qty 0.6

## 2017-06-30 MED ORDER — DARBEPOETIN ALFA 60 MCG/0.3ML IJ SOSY
PREFILLED_SYRINGE | INTRAMUSCULAR | Status: AC
Start: 2017-06-30 — End: 2017-06-30
  Filled 2017-06-30: qty 0.3

## 2017-06-30 MED ORDER — DARBEPOETIN ALFA 150 MCG/0.3ML IJ SOSY
PREFILLED_SYRINGE | INTRAMUSCULAR | Status: AC
  Filled 2017-06-30: qty 0.3

## 2017-07-01 LAB — PTH, INTACT AND CALCIUM
Calcium, Total (PTH): 9.7 mg/dL (ref 8.7–10.2)
PTH: 306 pg/mL — ABNORMAL HIGH (ref 15–65)

## 2017-07-13 ENCOUNTER — Other Ambulatory Visit (HOSPITAL_COMMUNITY): Payer: Self-pay | Admitting: *Deleted

## 2017-07-14 ENCOUNTER — Encounter (HOSPITAL_COMMUNITY)
Admission: RE | Admit: 2017-07-14 | Discharge: 2017-07-14 | Disposition: A | Discharge status: Home / Self Care | Payer: Medicare Other | Source: Ambulatory Visit | Attending: Nephrology | Admitting: Nephrology

## 2017-07-14 DIAGNOSIS — N183 Chronic kidney disease, stage 3 unspecified: Secondary | ICD-10-CM

## 2017-07-14 DIAGNOSIS — D631 Anemia in chronic kidney disease: Secondary | ICD-10-CM | POA: Diagnosis not present

## 2017-07-14 LAB — POCT HEMOGLOBIN-HEMACUE: Hemoglobin: 12 g/dL (ref 12.0–15.0)

## 2017-07-14 LAB — IRON AND TIBC
Iron: 41 ug/dL (ref 28–170)
Saturation Ratios: 17 % (ref 10.4–31.8)
TIBC: 244 ug/dL — ABNORMAL LOW (ref 250–450)
UIBC: 203 ug/dL

## 2017-07-14 LAB — FERRITIN: Ferritin: 807 ng/mL — ABNORMAL HIGH (ref 11–307)

## 2017-07-14 MED ORDER — FERUMOXYTOL INJECTION 510 MG/17 ML
510.0000 mg | Freq: Once | INTRAVENOUS | Status: DC
  Filled 2017-07-14: qty 17

## 2017-07-14 MED ORDER — DARBEPOETIN ALFA 150 MCG/0.3ML IJ SOSY: 120.0000 ug | PREFILLED_SYRINGE | INTRAMUSCULAR | Status: DC

## 2017-07-27 ENCOUNTER — Other Ambulatory Visit (HOSPITAL_COMMUNITY): Payer: Self-pay | Admitting: *Deleted

## 2017-07-28 ENCOUNTER — Encounter (HOSPITAL_COMMUNITY): Payer: Medicare Other | Attending: Nephrology

## 2017-08-02 ENCOUNTER — Other Ambulatory Visit (HOSPITAL_COMMUNITY): Payer: Self-pay | Admitting: *Deleted

## 2017-08-03 ENCOUNTER — Encounter (HOSPITAL_COMMUNITY)
Admission: RE | Admit: 2017-08-03 | Discharge: 2017-08-03 | Disposition: A | Discharge status: Home / Self Care | Payer: Medicare Other | Source: Ambulatory Visit | Attending: Nephrology | Admitting: Nephrology

## 2017-08-03 DIAGNOSIS — D631 Anemia in chronic kidney disease: Secondary | ICD-10-CM | POA: Diagnosis not present

## 2017-08-03 DIAGNOSIS — N183 Chronic kidney disease, stage 3 unspecified: Secondary | ICD-10-CM

## 2017-08-03 LAB — RENAL FUNCTION PANEL
ANION GAP: 8 (ref 5–15)
Albumin: 3.4 g/dL — ABNORMAL LOW (ref 3.5–5.0)
BUN: 53 mg/dL — ABNORMAL HIGH (ref 6–20)
CO2: 28 mmol/L (ref 22–32)
CREATININE: 2.92 mg/dL — AB (ref 0.44–1.00)
Calcium: 9.4 mg/dL (ref 8.9–10.3)
Chloride: 105 mmol/L (ref 101–111)
GFR calc non Af Amer: 17 mL/min — ABNORMAL LOW (ref >60)
GFR, EST AFRICAN AMERICAN: 19 mL/min — AB (ref >60)
Glucose, Bld: 130 mg/dL — ABNORMAL HIGH (ref 65–99)
Phosphorus: 4.1 mg/dL (ref 2.5–4.6)
Potassium: 4.3 mmol/L (ref 3.5–5.1)
Sodium: 141 mmol/L (ref 135–145)

## 2017-08-03 LAB — CBC
HEMATOCRIT: 37.9 % (ref 36.0–46.0)
HEMOGLOBIN: 11.2 g/dL — AB (ref 12.0–15.0)
MCH: 24.6 pg — AB (ref 26.0–34.0)
MCHC: 29.6 g/dL — ABNORMAL LOW (ref 30.0–36.0)
MCV: 83.1 fL (ref 78.0–100.0)
Platelets: 222 10*3/uL (ref 150–400)
RBC: 4.56 MIL/uL (ref 3.87–5.11)
RDW: 17 % — ABNORMAL HIGH (ref 11.5–15.5)
WBC: 8.1 10*3/uL (ref 4.0–10.5)

## 2017-08-03 MED ORDER — SODIUM CHLORIDE 0.9 % IV SOLN
510.0000 mg | INTRAVENOUS | Status: DC
  Administered 2017-08-03: 10:00:00 510 mg via INTRAVENOUS
  Filled 2017-08-03: qty 17

## 2017-08-03 MED ORDER — DARBEPOETIN ALFA 150 MCG/0.3ML IJ SOSY
PREFILLED_SYRINGE | INTRAMUSCULAR | Status: AC
  Administered 2017-08-03: 11:00:00 120 ug via SUBCUTANEOUS
  Filled 2017-08-03: qty 0.3

## 2017-08-03 MED ORDER — DARBEPOETIN ALFA 150 MCG/0.3ML IJ SOSY
120.0000 ug | PREFILLED_SYRINGE | INTRAMUSCULAR | Status: DC
  Administered 2017-08-03: 120 ug via SUBCUTANEOUS

## 2017-08-10 ENCOUNTER — Encounter (HOSPITAL_COMMUNITY)
Admission: RE | Admit: 2017-08-10 | Discharge: 2017-08-10 | Disposition: A | Discharge status: Home / Self Care | Payer: Medicare Other | Source: Ambulatory Visit | Attending: Nephrology | Admitting: Nephrology

## 2017-08-10 ENCOUNTER — Ambulatory Visit (HOSPITAL_COMMUNITY)
Admission: EM | Admit: 2017-08-10 | Discharge: 2017-08-10 | Disposition: A | Discharge status: Home / Self Care | Payer: Medicare Other | Attending: Emergency Medicine | Admitting: Emergency Medicine

## 2017-08-10 ENCOUNTER — Encounter (HOSPITAL_COMMUNITY): Payer: Self-pay | Admitting: Nurse Practitioner

## 2017-08-10 DIAGNOSIS — N189 Chronic kidney disease, unspecified: Secondary | ICD-10-CM | POA: Diagnosis not present

## 2017-08-10 DIAGNOSIS — D631 Anemia in chronic kidney disease: Secondary | ICD-10-CM | POA: Insufficient documentation

## 2017-08-10 DIAGNOSIS — G44209 Tension-type headache, unspecified, not intractable: Secondary | ICD-10-CM | POA: Diagnosis not present

## 2017-08-10 MED ORDER — SODIUM CHLORIDE 0.9 % IV SOLN
510.0000 mg | Freq: Once | INTRAVENOUS | Status: AC
  Administered 2017-08-10: 510 mg via INTRAVENOUS
  Filled 2017-08-10: qty 17

## 2017-08-10 MED ORDER — DEXAMETHASONE SODIUM PHOSPHATE 10 MG/ML IJ SOLN
INTRAMUSCULAR | Status: AC
  Filled 2017-08-10: qty 1

## 2017-08-10 MED ORDER — SUMATRIPTAN SUCCINATE 6 MG/0.5ML ~~LOC~~ SOLN
6.0000 mg | Freq: Once | SUBCUTANEOUS | Status: AC
  Administered 2017-08-10: 6 mg via SUBCUTANEOUS

## 2017-08-10 MED ORDER — ONDANSETRON 4 MG PO TBDP
8.0000 mg | ORAL_TABLET | Freq: Once | ORAL | Status: AC
  Administered 2017-08-10: 8 mg via ORAL

## 2017-08-10 MED ORDER — SUMATRIPTAN SUCCINATE 6 MG/0.5ML ~~LOC~~ SOLN
SUBCUTANEOUS | Status: AC
  Filled 2017-08-10: qty 0.5

## 2017-08-10 MED ORDER — BUTALBITAL-APAP-CAFFEINE 50-325-40 MG PO TABS: 1.0000 | ORAL_TABLET | Freq: Four times a day (QID) | ORAL | 0 refills | Status: DC | PRN

## 2017-08-10 MED ORDER — ONDANSETRON 4 MG PO TBDP
ORAL_TABLET | ORAL | Status: AC
Start: 2017-08-10 — End: 2017-08-10
  Filled 2017-08-10: qty 2

## 2017-08-10 MED ORDER — DEXAMETHASONE SODIUM PHOSPHATE 10 MG/ML IJ SOLN
10.0000 mg | Freq: Once | INTRAMUSCULAR | Status: AC
  Administered 2017-08-10: 10 mg via INTRAMUSCULAR

## 2017-08-10 NOTE — ED Triage Notes (Addendum)
Pt presents with c/o headache. The headache began about 2 days ago. The headache is a stabbing pain in her right temple and behind her right eye. She reports blurred vision, photophobia. She denies denies fevers, weakness, nausea, vomiting, history of headaches. She tried tylenol with no relief. Her blood pressure has been elevated at home. She does take her blood pressure medication daily as prescribed.

## 2017-08-10 NOTE — ED Provider Notes (Signed)
[place]   [id number] 08/10/17 Arrival Time: 7616  ASSESSMENT & PLAN:  1. Acute non intractable tension-type headache     Meds ordered this encounter  Medications  . dexamethasone (DECADRON) injection 10 mg  . SUMAtriptan (IMITREX) injection 6 mg  . ondansetron (ZOFRAN-ODT) disintegrating tablet 8 mg  . butalbital-acetaminophen-caffeine (FIORICET, ESGIC) 50-325-40 MG tablet    Sig: Take 1-2 tablets by mouth every 6 (six) hours as needed for headache.    Dispense:  20 tablet    Refill:  0    Order Specific Question:   Supervising Provider    Answer:   Melynda Ripple [[id number]]    Reviewed expectations re: course of current medical issues. Questions answered. Outlined signs and symptoms indicating need for more acute intervention. Patient verbalized understanding. After Visit Summary given.   SUBJECTIVE:  Catherine Williams is a 56 y.o. female who presents with complaint of headache starting 2 days ago.  She states the pain started in her right neck and then traveled around her scalp.  She has developed migraine sx's last day. She is having moderate to severe pain. ROS: As per HPI.   OBJECTIVE:  Vitals:   08/10/17 1530  BP: (!) 186/70  Pulse: 77  Resp: 17  Temp: 98.6 F (37 C)  TempSrc: Oral  SpO2: 94%     General appearance: alert; no distress Eyes: PERRLA; EOMI; conjunctiva normal HENT: normocephalic; atraumatic; TMs normal; nasal mucosa normal; oral mucosa normal Neck: supple Lungs: clear to auscultation bilaterally Heart: regular rate and rhythm Abdomen: soft, non-tender; bowel sounds normal; no masses or organomegaly; no guarding or rebound tenderness Back: no CVA tenderness Extremities: no cyanosis or edema; symmetrical with no gross deformities Skin: warm and dry Neurologic: normal gait; normal symmetric reflexes Psychological: alert and cooperative; normal mood and affect  Past Medical History:  Diagnosis Date  . Anemia   . Arthritis Dec.  2014   Gout  . Asthma   . CHF (congestive heart failure) ([place])   . COPD (chronic obstructive pulmonary disease) ([place])   . Diabetes mellitus   . Heart failure   . Hyperlipidemia   . Hypertension   . Kidney disease   . Morbid obesity ([place])   . Peripheral vascular disease ([place])   . Pinched nerve   . Sleep apnea      has a past medical history of Anemia; Arthritis (Dec. 2014); Asthma; CHF (congestive heart failure) ([place]); COPD (chronic obstructive pulmonary disease) ([place]); Diabetes mellitus; Heart failure; Hyperlipidemia; Hypertension; Kidney disease; Morbid obesity ([place]); Peripheral vascular disease ([place]); Pinched nerve; and Sleep apnea.  Results for orders placed or performed during the hospital encounter of 08/03/17  Renal function panel  Result Value Ref Range   Sodium 141 135 - 145 mmol/L   Potassium 4.3 3.5 - 5.1 mmol/L   Chloride 105 101 - 111 mmol/L   CO2 28 22 - 32 mmol/L   Glucose, Bld 130 (H) 65 - 99 mg/dL   BUN 53 (H) 6 - 20 mg/dL   Creatinine, Ser 2.92 (H) 0.44 - 1.00 mg/dL   Calcium 9.4 8.9 - 10.3 mg/dL   Phosphorus 4.1 2.5 - 4.6 mg/dL   Albumin 3.4 (L) 3.5 - 5.0 g/dL   GFR calc non Af Amer 17 (L) >60 mL/min   GFR calc Af Amer 19 (L) >60 mL/min   Anion gap 8 5 - 15  CBC  Result Value Ref Range   WBC 8.1 4.0 - 10.5 K/uL   RBC  4.56 3.87 - 5.11 MIL/uL   Hemoglobin 11.2 (L) 12.0 - 15.0 g/dL   HCT 37.9 36.0 - 46.0 %   MCV 83.1 78.0 - 100.0 fL   MCH 24.6 (L) 26.0 - 34.0 pg   MCHC 29.6 (L) 30.0 - 36.0 g/dL   RDW 17.0 (H) 11.5 - 15.5 %   Platelets 222 150 - 400 K/uL    Labs Reviewed - No data to display  Imaging: No results found.  Allergies  Allergen Reactions  . Omnipaque [Iohexol] Nausea And Vomiting    Contrast Dye- Effects Kidney's    Family History  Problem Relation Age of Onset  . Lung cancer Mother   . Clotting disorder Mother   . Heart disease Mother   . Cancer Mother        Lung  . Deep vein thrombosis Mother   . Diabetes Mother   .  Hypertension Mother   . Varicose Veins Mother   . Cancer Father   . Clotting disorder Sister   . Heart attack Sister   . Diabetes Sister   . Clotting disorder Brother   . Deep vein thrombosis Brother   . Diabetes Brother   . Heart disease Brother   . Heart disease Sister    Past Surgical History:  Procedure Laterality Date  . Atherectomy and angioplasty  10/18/2011   left posterior tibial artery  . HAND SURGERY     right  . OVARY SURGERY     Left  . TOE AMPUTATION  Sept. 25,2012   Left 4th and 5th toes         Lysbeth Penner, [place] 08/10/17 1606

## 2017-09-07 ENCOUNTER — Inpatient Hospital Stay (HOSPITAL_COMMUNITY): Admission: RE | Admit: 2017-09-07 | Payer: Medicare Other | Source: Ambulatory Visit

## 2017-09-19 ENCOUNTER — Encounter (HOSPITAL_COMMUNITY)
Admission: RE | Admit: 2017-09-19 | Discharge: 2017-09-19 | Disposition: A | Discharge status: Home / Self Care | Payer: Medicare Other | Source: Ambulatory Visit | Attending: Nephrology | Admitting: Nephrology

## 2017-09-19 DIAGNOSIS — N183 Chronic kidney disease, stage 3 unspecified: Secondary | ICD-10-CM

## 2017-09-19 DIAGNOSIS — D638 Anemia in other chronic diseases classified elsewhere: Secondary | ICD-10-CM | POA: Diagnosis not present

## 2017-09-19 LAB — CBC
HEMATOCRIT: 41 % (ref 36.0–46.0)
Hemoglobin: 12.2 g/dL (ref 12.0–15.0)
MCH: 25.5 pg — AB (ref 26.0–34.0)
MCHC: 29.8 g/dL — AB (ref 30.0–36.0)
MCV: 85.8 fL (ref 78.0–100.0)
PLATELETS: 197 10*3/uL (ref 150–400)
RBC: 4.78 MIL/uL (ref 3.87–5.11)
RDW: 15.8 % — AB (ref 11.5–15.5)
WBC: 9.3 10*3/uL (ref 4.0–10.5)

## 2017-09-19 LAB — RENAL FUNCTION PANEL
Albumin: 3.3 g/dL — ABNORMAL LOW (ref 3.5–5.0)
Anion gap: 8 (ref 5–15)
BUN: 47 mg/dL — ABNORMAL HIGH (ref 6–20)
CALCIUM: 9.9 mg/dL (ref 8.9–10.3)
CHLORIDE: 104 mmol/L (ref 101–111)
CO2: 28 mmol/L (ref 22–32)
CREATININE: 2.88 mg/dL — AB (ref 0.44–1.00)
GFR, EST AFRICAN AMERICAN: 20 mL/min — AB (ref >60)
GFR, EST NON AFRICAN AMERICAN: 17 mL/min — AB (ref >60)
Glucose, Bld: 145 mg/dL — ABNORMAL HIGH (ref 65–99)
PHOSPHORUS: 3.5 mg/dL (ref 2.5–4.6)
Potassium: 4.6 mmol/L (ref 3.5–5.1)
SODIUM: 140 mmol/L (ref 135–145)

## 2017-09-19 LAB — IRON AND TIBC
Iron: 69 ug/dL (ref 28–170)
Saturation Ratios: 31 % (ref 10.4–31.8)
TIBC: 225 ug/dL — ABNORMAL LOW (ref 250–450)
UIBC: 156 ug/dL

## 2017-09-19 LAB — FERRITIN: Ferritin: 1045 ng/mL — ABNORMAL HIGH (ref 11–307)

## 2017-09-19 MED ORDER — DARBEPOETIN ALFA 150 MCG/0.3ML IJ SOSY: 120.0000 ug | PREFILLED_SYRINGE | INTRAMUSCULAR | Status: DC

## 2017-09-21 ENCOUNTER — Ambulatory Visit (HOSPITAL_COMMUNITY)
Admission: EM | Admit: 2017-09-21 | Discharge: 2017-09-21 | Disposition: A | Discharge status: Home / Self Care | Payer: Medicare Other | Attending: Family Medicine | Admitting: Family Medicine

## 2017-09-21 ENCOUNTER — Encounter (HOSPITAL_COMMUNITY): Payer: Self-pay | Admitting: Emergency Medicine

## 2017-09-21 DIAGNOSIS — M436 Torticollis: Secondary | ICD-10-CM | POA: Diagnosis not present

## 2017-09-21 LAB — PTH, INTACT AND CALCIUM
Calcium, Total (PTH): 9.9 mg/dL (ref 8.7–10.2)
PTH: 306 pg/mL — ABNORMAL HIGH (ref 15–65)

## 2017-09-21 MED ORDER — PREDNISONE 20 MG PO TABS: ORAL_TABLET | ORAL | 0 refills | Status: DC

## 2017-09-21 MED ORDER — OXYCODONE-ACETAMINOPHEN 5-325 MG PO TABS: 2.0000 | ORAL_TABLET | ORAL | 0 refills | Status: DC | PRN

## 2017-09-21 NOTE — ED Provider Notes (Signed)
[place]   [id number] 09/21/17 Arrival Time: 1938   SUBJECTIVE:  Catherine Williams is a 56 y.o. female who presents to the urgent care with complaint of neck pain and earache x 3 days  Patient just woke up with pain in her neck that became much worse when she tried to move it. The pain is described as lancinating and radiates up into the left ear.  No trauma or fever   Past Medical History:  Diagnosis Date  . Anemia   . Arthritis Dec. 2014   Gout  . Asthma   . CHF (congestive heart failure) ([place])   . COPD (chronic obstructive pulmonary disease) ([place])   . Diabetes mellitus   . Heart failure   . Hyperlipidemia   . Hypertension   . Kidney disease   . Morbid obesity ([place])   . Peripheral vascular disease ([place])   . Pinched nerve   . Sleep apnea    Family History  Problem Relation Age of Onset  . Lung cancer Mother   . Clotting disorder Mother   . Heart disease Mother   . Cancer Mother        Lung  . Deep vein thrombosis Mother   . Diabetes Mother   . Hypertension Mother   . Varicose Veins Mother   . Cancer Father   . Clotting disorder Sister   . Heart attack Sister   . Diabetes Sister   . Clotting disorder Brother   . Deep vein thrombosis Brother   . Diabetes Brother   . Heart disease Brother   . Heart disease Sister    Social History   Social History  . Marital status: Single    Spouse name: N/A  . Number of children: Y  . Years of education: N/A   Occupational History  . 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   Social History Main Topics  . Smoking status: Never Smoker  . Smokeless tobacco: Never Used  . Alcohol use No  . Drug use: Yes    Types: Marijuana     Comment: FORMER>>smoked weed from age 3 to 38>> quit at age 45   . Sexual activity: Not on file   Other Topics Concern  . Not on file   Social History Narrative   Lives with daughter         No outpatient prescriptions have been marked as taking for the  09/21/17 encounter [facility] Encounter).   Allergies  Allergen Reactions  . Omnipaque [Iohexol] Nausea And Vomiting    Contrast Dye- Effects Kidney's      ROS: As per HPI, remainder of ROS negative.   OBJECTIVE:   Vitals:   09/21/17 2034 09/21/17 2035  BP: (!) 197/75   Pulse: 78   Resp: 18   Temp: 98.4 F (36.9 C)   TempSrc: Oral   SpO2: 99%   Weight:  (!) 318 lb (144.2 kg)  Height:  5\' 2"  (1.575 m)     General appearance: alert; no distress Eyes: PERRL; EOMI; conjunctiva normal HENT: normocephalic; atraumatic; TMs normal, canal normal, external ears normal without trauma; nasal mucosa normal; oral mucosa normal Neck: Very tender at the base of the C-spine at C7, patient having trouble rotating the neck or looking up. Back: no CVA tenderness Extremities: no cyanosis or edema; symmetrical with no gross deformities Skin: warm and dry Neurologic: normal gait; grossly normal; no trouble moving arms or having loss of sensation in arms. Psychological:  alert and cooperative; normal mood and affect      Labs:  Results for orders placed or performed during the hospital encounter of 09/19/17  Ferritin  Result Value Ref Range   Ferritin 1,045 (H) 11 - 307 ng/mL  Iron and TIBC  Result Value Ref Range   Iron 69 28 - 170 ug/dL   TIBC 225 (L) 250 - 450 ug/dL   Saturation Ratios 31 10.4 - 31.8 %   UIBC 156 ug/dL  Renal function panel  Result Value Ref Range   Sodium 140 135 - 145 mmol/L   Potassium 4.6 3.5 - 5.1 mmol/L   Chloride 104 101 - 111 mmol/L   CO2 28 22 - 32 mmol/L   Glucose, Bld 145 (H) 65 - 99 mg/dL   BUN 47 (H) 6 - 20 mg/dL   Creatinine, Ser 2.88 (H) 0.44 - 1.00 mg/dL   Calcium 9.9 8.9 - 10.3 mg/dL   Phosphorus 3.5 2.5 - 4.6 mg/dL   Albumin 3.3 (L) 3.5 - 5.0 g/dL   GFR calc non Af Amer 17 (L) >60 mL/min   GFR calc Af Amer 20 (L) >60 mL/min   Anion gap 8 5 - 15  PTH, intact and calcium  Result Value Ref Range   PTH 306 (H) 15 - 65 pg/mL   Calcium,  Total (PTH) 9.9 8.7 - 10.2 mg/dL   PTH Interp Comment   CBC  Result Value Ref Range   WBC 9.3 4.0 - 10.5 K/uL   RBC 4.78 3.87 - 5.11 MIL/uL   Hemoglobin 12.2 12.0 - 15.0 g/dL   HCT 41.0 36.0 - 46.0 %   MCV 85.8 78.0 - 100.0 fL   MCH 25.5 (L) 26.0 - 34.0 pg   MCHC 29.8 (L) 30.0 - 36.0 g/dL   RDW 15.8 (H) 11.5 - 15.5 %   Platelets 197 150 - 400 K/uL    Labs Reviewed - No data to display  No results found.     ASSESSMENT & PLAN:  1. Torticollis     Meds ordered this encounter  Medications  . oxyCODONE-acetaminophen (PERCOCET/ROXICET) 5-325 MG tablet    Sig: Take 2 tablets by mouth every 4 (four) hours as needed for severe pain.    Dispense:  15 tablet    Refill:  0  . predniSONE (DELTASONE) 20 MG tablet    Sig: Two daily with food    Dispense:  6 tablet    Refill:  0    Reviewed expectations re: course of current medical issues. Questions answered. Outlined signs and symptoms indicating need for more acute intervention. Patient verbalized understanding. After Visit Summary given.    Procedures:      Robyn Haber, MD 09/21/17 2105

## 2017-09-21 NOTE — ED Triage Notes (Signed)
Pt here for neck pain and earache x 3 days

## 2017-09-21 NOTE — Discharge Instructions (Signed)
Return to the emergency department if you find that there has been no improvement in 24 hours

## 2017-10-10 DIAGNOSIS — E1322 Other specified diabetes mellitus with diabetic chronic kidney disease: Secondary | ICD-10-CM | POA: Diagnosis not present

## 2017-10-10 DIAGNOSIS — I10 Essential (primary) hypertension: Secondary | ICD-10-CM | POA: Diagnosis not present

## 2017-10-10 DIAGNOSIS — E119 Type 2 diabetes mellitus without complications: Secondary | ICD-10-CM | POA: Diagnosis not present

## 2017-10-17 ENCOUNTER — Inpatient Hospital Stay (HOSPITAL_COMMUNITY)
Admission: RE | Admit: 2017-10-17 | Discharge: 2017-10-17 | Disposition: A | Discharge status: Home / Self Care | Payer: Medicare Other | Source: Ambulatory Visit | Attending: Nephrology | Admitting: Nephrology

## 2017-10-18 DIAGNOSIS — I129 Hypertensive chronic kidney disease with stage 1 through stage 4 chronic kidney disease, or unspecified chronic kidney disease: Secondary | ICD-10-CM | POA: Diagnosis not present

## 2017-10-18 DIAGNOSIS — R809 Proteinuria, unspecified: Secondary | ICD-10-CM | POA: Diagnosis not present

## 2017-10-18 DIAGNOSIS — N2581 Secondary hyperparathyroidism of renal origin: Secondary | ICD-10-CM | POA: Diagnosis not present

## 2017-10-18 DIAGNOSIS — E1129 Type 2 diabetes mellitus with other diabetic kidney complication: Secondary | ICD-10-CM | POA: Diagnosis not present

## 2017-10-18 DIAGNOSIS — D509 Iron deficiency anemia, unspecified: Secondary | ICD-10-CM | POA: Diagnosis not present

## 2017-10-18 DIAGNOSIS — N184 Chronic kidney disease, stage 4 (severe): Secondary | ICD-10-CM | POA: Diagnosis not present

## 2017-10-18 DIAGNOSIS — I509 Heart failure, unspecified: Secondary | ICD-10-CM | POA: Diagnosis not present

## 2017-10-18 DIAGNOSIS — Z6841 Body Mass Index (BMI) 40.0 and over, adult: Secondary | ICD-10-CM | POA: Diagnosis not present

## 2017-10-18 DIAGNOSIS — N179 Acute kidney failure, unspecified: Secondary | ICD-10-CM | POA: Diagnosis not present

## 2017-10-18 DIAGNOSIS — R7989 Other specified abnormal findings of blood chemistry: Secondary | ICD-10-CM | POA: Diagnosis not present

## 2017-10-18 DIAGNOSIS — G4730 Sleep apnea, unspecified: Secondary | ICD-10-CM | POA: Diagnosis not present

## 2017-10-23 ENCOUNTER — Encounter (HOSPITAL_COMMUNITY): Payer: Medicare Other

## 2017-11-02 ENCOUNTER — Encounter (HOSPITAL_COMMUNITY)
Admission: RE | Admit: 2017-11-02 | Discharge: 2017-11-02 | Disposition: A | Discharge status: Home / Self Care | Payer: Medicare Other | Source: Ambulatory Visit | Attending: Nephrology | Admitting: Nephrology

## 2017-11-02 VITALS — BP 172/65 | HR 70 | Temp 98.2°F | Resp 18

## 2017-11-02 DIAGNOSIS — N183 Chronic kidney disease, stage 3 unspecified: Secondary | ICD-10-CM

## 2017-11-02 DIAGNOSIS — D638 Anemia in other chronic diseases classified elsewhere: Secondary | ICD-10-CM | POA: Insufficient documentation

## 2017-11-02 LAB — RENAL FUNCTION PANEL
ALBUMIN: 3.2 g/dL — AB (ref 3.5–5.0)
ANION GAP: 7 (ref 5–15)
BUN: 49 mg/dL — ABNORMAL HIGH (ref 6–20)
CHLORIDE: 106 mmol/L (ref 101–111)
CO2: 28 mmol/L (ref 22–32)
Calcium: 9.6 mg/dL (ref 8.9–10.3)
Creatinine, Ser: 2.79 mg/dL — ABNORMAL HIGH (ref 0.44–1.00)
GFR calc Af Amer: 20 mL/min — ABNORMAL LOW (ref >60)
GFR calc non Af Amer: 18 mL/min — ABNORMAL LOW (ref >60)
GLUCOSE: 187 mg/dL — AB (ref 65–99)
PHOSPHORUS: 3.1 mg/dL (ref 2.5–4.6)
POTASSIUM: 4.2 mmol/L (ref 3.5–5.1)
Sodium: 141 mmol/L (ref 135–145)

## 2017-11-02 LAB — CBC
HEMATOCRIT: 37.4 % (ref 36.0–46.0)
HEMOGLOBIN: 11.1 g/dL — AB (ref 12.0–15.0)
MCH: 25.6 pg — ABNORMAL LOW (ref 26.0–34.0)
MCHC: 29.7 g/dL — AB (ref 30.0–36.0)
MCV: 86.4 fL (ref 78.0–100.0)
Platelets: 194 10*3/uL (ref 150–400)
RBC: 4.33 MIL/uL (ref 3.87–5.11)
RDW: 15.6 % — ABNORMAL HIGH (ref 11.5–15.5)
WBC: 9.3 10*3/uL (ref 4.0–10.5)

## 2017-11-02 MED ORDER — DARBEPOETIN ALFA 150 MCG/0.3ML IJ SOSY
120.0000 ug | PREFILLED_SYRINGE | INTRAMUSCULAR | Status: DC
  Administered 2017-11-02: 120 ug via SUBCUTANEOUS

## 2017-11-02 MED ORDER — DARBEPOETIN ALFA 150 MCG/0.3ML IJ SOSY
PREFILLED_SYRINGE | INTRAMUSCULAR | Status: AC
  Administered 2017-11-02: 13:00:00 120 ug via SUBCUTANEOUS
  Filled 2017-11-02: qty 0.3

## 2017-11-14 DIAGNOSIS — I10 Essential (primary) hypertension: Secondary | ICD-10-CM | POA: Diagnosis not present

## 2017-11-14 DIAGNOSIS — Z79899 Other long term (current) drug therapy: Secondary | ICD-10-CM | POA: Diagnosis not present

## 2017-11-14 DIAGNOSIS — R600 Localized edema: Secondary | ICD-10-CM | POA: Diagnosis not present

## 2017-11-14 DIAGNOSIS — E119 Type 2 diabetes mellitus without complications: Secondary | ICD-10-CM | POA: Diagnosis not present

## 2017-11-14 DIAGNOSIS — R635 Abnormal weight gain: Secondary | ICD-10-CM | POA: Diagnosis not present

## 2017-11-30 ENCOUNTER — Ambulatory Visit (HOSPITAL_COMMUNITY)
Admission: RE | Admit: 2017-11-30 | Discharge: 2017-11-30 | Disposition: A | Discharge status: Home / Self Care | Payer: Medicare Other | Source: Ambulatory Visit | Attending: Nephrology | Admitting: Nephrology

## 2017-11-30 VITALS — BP 153/68 | HR 64 | Temp 98.4°F | Resp 16

## 2017-11-30 DIAGNOSIS — N183 Chronic kidney disease, stage 3 unspecified: Secondary | ICD-10-CM

## 2017-11-30 DIAGNOSIS — D649 Anemia, unspecified: Secondary | ICD-10-CM | POA: Insufficient documentation

## 2017-11-30 LAB — RENAL FUNCTION PANEL
ANION GAP: 10 (ref 5–15)
Albumin: 3.4 g/dL — ABNORMAL LOW (ref 3.5–5.0)
BUN: 49 mg/dL — ABNORMAL HIGH (ref 6–20)
CALCIUM: 9.8 mg/dL (ref 8.9–10.3)
CHLORIDE: 105 mmol/L (ref 101–111)
CO2: 26 mmol/L (ref 22–32)
Creatinine, Ser: 3.09 mg/dL — ABNORMAL HIGH (ref 0.44–1.00)
GFR calc non Af Amer: 16 mL/min — ABNORMAL LOW (ref >60)
GFR, EST AFRICAN AMERICAN: 18 mL/min — AB (ref >60)
Glucose, Bld: 136 mg/dL — ABNORMAL HIGH (ref 65–99)
Phosphorus: 3.5 mg/dL (ref 2.5–4.6)
Potassium: 4.4 mmol/L (ref 3.5–5.1)
SODIUM: 141 mmol/L (ref 135–145)

## 2017-11-30 LAB — CBC
HCT: 39.6 % (ref 36.0–46.0)
HEMOGLOBIN: 11.8 g/dL — AB (ref 12.0–15.0)
MCH: 26 pg (ref 26.0–34.0)
MCHC: 29.8 g/dL — ABNORMAL LOW (ref 30.0–36.0)
MCV: 87.2 fL (ref 78.0–100.0)
Platelets: 209 10*3/uL (ref 150–400)
RBC: 4.54 MIL/uL (ref 3.87–5.11)
RDW: 15 % (ref 11.5–15.5)
WBC: 9.8 10*3/uL (ref 4.0–10.5)

## 2017-11-30 LAB — IRON AND TIBC
IRON: 60 ug/dL (ref 28–170)
Saturation Ratios: 27 % (ref 10.4–31.8)
TIBC: 221 ug/dL — ABNORMAL LOW (ref 250–450)
UIBC: 161 ug/dL

## 2017-11-30 LAB — FERRITIN: FERRITIN: 1142 ng/mL — AB (ref 11–307)

## 2017-11-30 MED ORDER — DARBEPOETIN ALFA 150 MCG/0.3ML IJ SOSY
120.0000 ug | PREFILLED_SYRINGE | INTRAMUSCULAR | Status: DC
  Administered 2017-11-30: 14:00:00 120 ug via SUBCUTANEOUS

## 2017-11-30 MED ORDER — DARBEPOETIN ALFA 150 MCG/0.3ML IJ SOSY
PREFILLED_SYRINGE | INTRAMUSCULAR | Status: AC
  Filled 2017-11-30: qty 0.3

## 2017-12-14 DIAGNOSIS — N179 Acute kidney failure, unspecified: Secondary | ICD-10-CM | POA: Diagnosis not present

## 2017-12-14 DIAGNOSIS — G4730 Sleep apnea, unspecified: Secondary | ICD-10-CM | POA: Diagnosis not present

## 2017-12-14 DIAGNOSIS — D509 Iron deficiency anemia, unspecified: Secondary | ICD-10-CM | POA: Diagnosis not present

## 2017-12-14 DIAGNOSIS — I129 Hypertensive chronic kidney disease with stage 1 through stage 4 chronic kidney disease, or unspecified chronic kidney disease: Secondary | ICD-10-CM | POA: Diagnosis not present

## 2017-12-14 DIAGNOSIS — N184 Chronic kidney disease, stage 4 (severe): Secondary | ICD-10-CM | POA: Diagnosis not present

## 2017-12-14 DIAGNOSIS — I509 Heart failure, unspecified: Secondary | ICD-10-CM | POA: Diagnosis not present

## 2017-12-14 DIAGNOSIS — Z6841 Body Mass Index (BMI) 40.0 and over, adult: Secondary | ICD-10-CM | POA: Diagnosis not present

## 2017-12-14 DIAGNOSIS — E1122 Type 2 diabetes mellitus with diabetic chronic kidney disease: Secondary | ICD-10-CM | POA: Diagnosis not present

## 2017-12-14 DIAGNOSIS — R7989 Other specified abnormal findings of blood chemistry: Secondary | ICD-10-CM | POA: Diagnosis not present

## 2017-12-14 DIAGNOSIS — N2581 Secondary hyperparathyroidism of renal origin: Secondary | ICD-10-CM | POA: Diagnosis not present

## 2017-12-14 DIAGNOSIS — R809 Proteinuria, unspecified: Secondary | ICD-10-CM | POA: Diagnosis not present

## 2017-12-28 ENCOUNTER — Encounter (HOSPITAL_COMMUNITY)
Admission: RE | Admit: 2017-12-28 | Discharge: 2017-12-28 | Disposition: A | Discharge status: Home / Self Care | Payer: Medicare Other | Source: Ambulatory Visit | Attending: Nephrology | Admitting: Nephrology

## 2017-12-28 VITALS — BP 151/65 | HR 66 | Temp 98.4°F | Resp 18

## 2017-12-28 DIAGNOSIS — D631 Anemia in chronic kidney disease: Secondary | ICD-10-CM | POA: Insufficient documentation

## 2017-12-28 DIAGNOSIS — N183 Chronic kidney disease, stage 3 unspecified: Secondary | ICD-10-CM

## 2017-12-28 LAB — CBC
HEMATOCRIT: 38.7 % (ref 36.0–46.0)
Hemoglobin: 11.5 g/dL — ABNORMAL LOW (ref 12.0–15.0)
MCH: 26.3 pg (ref 26.0–34.0)
MCHC: 29.7 g/dL — AB (ref 30.0–36.0)
MCV: 88.4 fL (ref 78.0–100.0)
Platelets: 213 10*3/uL (ref 150–400)
RBC: 4.38 MIL/uL (ref 3.87–5.11)
RDW: 15.1 % (ref 11.5–15.5)
WBC: 7.4 10*3/uL (ref 4.0–10.5)

## 2017-12-28 LAB — RENAL FUNCTION PANEL
Albumin: 3.3 g/dL — ABNORMAL LOW (ref 3.5–5.0)
Anion gap: 9 (ref 5–15)
BUN: 58 mg/dL — AB (ref 6–20)
CHLORIDE: 104 mmol/L (ref 101–111)
CO2: 28 mmol/L (ref 22–32)
Calcium: 9.6 mg/dL (ref 8.9–10.3)
Creatinine, Ser: 3.49 mg/dL — ABNORMAL HIGH (ref 0.44–1.00)
GFR calc Af Amer: 16 mL/min — ABNORMAL LOW (ref >60)
GFR calc non Af Amer: 13 mL/min — ABNORMAL LOW (ref >60)
Glucose, Bld: 177 mg/dL — ABNORMAL HIGH (ref 65–99)
POTASSIUM: 4.7 mmol/L (ref 3.5–5.1)
Phosphorus: 4 mg/dL (ref 2.5–4.6)
Sodium: 141 mmol/L (ref 135–145)

## 2017-12-28 MED ORDER — DARBEPOETIN ALFA 150 MCG/0.3ML IJ SOSY: 120.0000 ug | PREFILLED_SYRINGE | INTRAMUSCULAR | Status: DC

## 2017-12-28 MED ORDER — DARBEPOETIN ALFA 60 MCG/0.3ML IJ SOSY
PREFILLED_SYRINGE | INTRAMUSCULAR | Status: AC
  Administered 2017-12-28: 13:00:00 120 ug via SUBCUTANEOUS
  Filled 2017-12-28: qty 0.3

## 2017-12-28 MED ORDER — DARBEPOETIN ALFA 60 MCG/0.3ML IJ SOSY
PREFILLED_SYRINGE | INTRAMUSCULAR | Status: AC
  Filled 2017-12-28: qty 0.6

## 2018-01-02 DIAGNOSIS — E1322 Other specified diabetes mellitus with diabetic chronic kidney disease: Secondary | ICD-10-CM | POA: Diagnosis not present

## 2018-01-02 DIAGNOSIS — I10 Essential (primary) hypertension: Secondary | ICD-10-CM | POA: Diagnosis not present

## 2018-01-02 DIAGNOSIS — E119 Type 2 diabetes mellitus without complications: Secondary | ICD-10-CM | POA: Diagnosis not present

## 2018-01-24 ENCOUNTER — Other Ambulatory Visit (HOSPITAL_COMMUNITY): Payer: Self-pay | Admitting: *Deleted

## 2018-01-25 ENCOUNTER — Encounter (HOSPITAL_COMMUNITY): Payer: Self-pay | Admitting: Family Medicine

## 2018-01-25 ENCOUNTER — Encounter (HOSPITAL_COMMUNITY): Payer: Medicare Other

## 2018-01-25 ENCOUNTER — Ambulatory Visit (HOSPITAL_COMMUNITY)
Admission: EM | Admit: 2018-01-25 | Discharge: 2018-01-25 | Disposition: A | Discharge status: Home / Self Care | Payer: Medicare Other | Attending: Family Medicine | Admitting: Family Medicine

## 2018-01-25 DIAGNOSIS — R69 Illness, unspecified: Secondary | ICD-10-CM | POA: Diagnosis not present

## 2018-01-25 DIAGNOSIS — J111 Influenza due to unidentified influenza virus with other respiratory manifestations: Secondary | ICD-10-CM

## 2018-01-25 MED ORDER — OSELTAMIVIR PHOSPHATE 75 MG PO CAPS: 75.0000 mg | ORAL_CAPSULE | Freq: Two times a day (BID) | ORAL | 0 refills | Status: AC

## 2018-01-25 MED ORDER — HYDROCODONE-HOMATROPINE 5-1.5 MG/5ML PO SYRP: 5.0000 mL | ORAL_SOLUTION | Freq: Four times a day (QID) | ORAL | 0 refills | Status: DC | PRN

## 2018-01-25 NOTE — ED Provider Notes (Signed)
[place]   [id number] 01/25/18 Arrival Time: 8502  ASSESSMENT & PLAN:  1. Influenza-like illness     Meds ordered this encounter  Medications  . HYDROcodone-homatropine (HYCODAN) 5-1.5 MG/5ML syrup    Sig: Take 5 mLs by mouth every 6 (six) hours as needed for cough.    Dispense:  90 mL    Refill:  0  . oseltamivir (TAMIFLU) 75 MG capsule    Sig: Take 1 capsule (75 mg total) by mouth 2 (two) times daily for 5 days.    Dispense:  10 capsule    Refill:  0    Cough medication sedation precautions. Discussed typical duration of symptoms. OTC symptom care as needed. Ensure adequate fluid intake and rest. May f/u with PCP or here as needed.  Reviewed expectations re: course of current medical issues. Questions answered. Outlined signs and symptoms indicating need for more acute intervention. Patient verbalized understanding. After Visit Summary given.   SUBJECTIVE: History from: patient.  Catherine Williams is a 57 y.o. female who presents with complaint of nasal congestion, post-nasal drainage, and a persistent dry cough. Onset abrupt, approximately 2 days ago. Overall fatigued with body aches. SOB: none. Wheezing: none. Fever: yes, subjective. Overall normal PO intake without n/v. Sick contacts: yes, grandchildren with influenza. OTC treatment: none.  Received flu shot this year: no.  Social History   Tobacco Use  Smoking Status Never Smoker  Smokeless Tobacco Never Used    ROS: As per HPI.   OBJECTIVE:  Vitals:   01/25/18 1400  BP: (!) 186/70  Pulse: 79  Resp: 18  Temp: 99 F (37.2 C)  SpO2: 95%     General appearance: alert; appears fatigued HEENT: nasal congestion; clear runny nose; throat irritation secondary to post-nasal drainage Neck: supple without LAD Lungs: unlabored respirations, symmetrical air entry; cough: moderate; no respiratory distress Skin: warm and dry Psychological: alert and cooperative; normal mood and  affect   Allergies  Allergen Reactions  . Omnipaque [Iohexol] Nausea And Vomiting    Contrast Dye- Effects Kidney's    Past Medical History:  Diagnosis Date  . Anemia   . Arthritis Dec. 2014   Gout  . Asthma   . CHF (congestive heart failure) ([place])   . COPD (chronic obstructive pulmonary disease) ([place])   . Diabetes mellitus   . Heart failure   . Hyperlipidemia   . Hypertension   . Kidney disease   . Morbid obesity ([place])   . Peripheral vascular disease ([place])   . Pinched nerve   . Sleep apnea    Family History  Problem Relation Age of Onset  . Lung cancer Mother   . Clotting disorder Mother   . Heart disease Mother   . Cancer Mother        Lung  . Deep vein thrombosis Mother   . Diabetes Mother   . Hypertension Mother   . Varicose Veins Mother   . Cancer Father   . Clotting disorder Sister   . Heart attack Sister   . Diabetes Sister   . Clotting disorder Brother   . Deep vein thrombosis Brother   . Diabetes Brother   . Heart disease Brother   . Heart disease Sister    Social History   Socioeconomic History  . Marital status: Single    Spouse name: Not on file  . Number of children: Y  . Years of education: Not on file  . Highest education level: Not on file  Social Needs  . Financial resource strain: Not on file  . Food insecurity - worry: Not on file  . Food insecurity - inability: Not on file  . Transportation needs - medical: Not on file  . Transportation needs - non-medical: Not on file  Occupational History  . 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  Tobacco Use  . Smoking status: Never Smoker  . Smokeless tobacco: Never Used  Substance and Sexual Activity  . Alcohol use: No    Alcohol/week: 0.0 oz  . Drug use: Yes    Types: Marijuana    Comment: FORMER>>smoked weed from age 79 to 38>> quit at age 47   . Sexual activity: Not on file  Other Topics Concern  . Not on file  Social History  Narrative   Lives with daughter                 Vanessa Kick, MD 01/25/18 1425

## 2018-01-25 NOTE — Discharge Instructions (Signed)

## 2018-01-25 NOTE — ED Triage Notes (Signed)
Pt here for flu like symptoms since Tuesday. Reports that her grandchildren were dx with the flu. Denies any OTC meds.

## 2018-01-30 ENCOUNTER — Inpatient Hospital Stay (HOSPITAL_COMMUNITY): Admission: RE | Admit: 2018-01-30 | Payer: Medicare Other | Source: Ambulatory Visit

## 2018-02-06 ENCOUNTER — Telehealth: Payer: Self-pay | Admitting: Pulmonary Disease

## 2018-02-06 NOTE — Telephone Encounter (Signed)
She has not been seen since 1/20 1828. Please arrange for face-to-face office visit with NP

## 2018-02-06 NOTE — Telephone Encounter (Signed)
Spoke with pt, scheduled to see TP on 2/21.  New cpap will need to be ordered then after OV.  Nothing further needed.

## 2018-02-06 NOTE — Telephone Encounter (Signed)
Pt requesting order for new cpap machine and supplies.  Current machine is 58-59 years old per pt. DME: [facility].  RA ok to order?  Thanks!

## 2018-02-08 ENCOUNTER — Ambulatory Visit (INDEPENDENT_AMBULATORY_CARE_PROVIDER_SITE_OTHER): Payer: Medicare Other | Admitting: Adult Health

## 2018-02-08 ENCOUNTER — Encounter: Payer: Self-pay | Admitting: Adult Health

## 2018-02-08 DIAGNOSIS — J9611 Chronic respiratory failure with hypoxia: Secondary | ICD-10-CM

## 2018-02-08 DIAGNOSIS — W19XXXS Unspecified fall, sequela: Secondary | ICD-10-CM | POA: Diagnosis not present

## 2018-02-08 DIAGNOSIS — G4733 Obstructive sleep apnea (adult) (pediatric): Secondary | ICD-10-CM | POA: Diagnosis not present

## 2018-02-08 DIAGNOSIS — W19XXXA Unspecified fall, initial encounter: Secondary | ICD-10-CM | POA: Insufficient documentation

## 2018-02-08 NOTE — Patient Instructions (Addendum)
Restart CPAP at bedtime. Order for new CPAP At bedtime   Continue on oxygen At bedtime  Until CPAP is received.  Work on healthy weight Do not drive a sleepy Follow-up in 1 year with Dr. Elsworth Soho and as needed

## 2018-02-08 NOTE — Addendum Note (Signed)
Addended by: Parke Poisson E on: 02/08/2018 11:30 AM   Modules accepted: Orders

## 2018-02-08 NOTE — Progress Notes (Signed)
@Patient  ID: Catherine Williams, female    DOB: [birth year], 59 y.o.   MRN: [id number]  Chief Complaint  Patient presents with  . Follow-up    OSA     Referring provider: Nolene Ebbs, MD  HPI: 57 year old female morbidly obese former smoker followed for severe sleep apnea on CPAP with nocturnal oxygen Past medical history is significant for chronic diastolic heart failure, hypertension, diabetes and chronic kidney disease.   TEST  Significant tests/ events  NPSG 04/2011: AHI 89/hr, desat to 47% Auto 06/2013: Optimal pressure 16cm ([place])  - added 02 2lpm at hs 06/2015   02/08/2018 Follow up ; OSA  Patient returns for a one year follow-up for severe sleep apnea.  Patient is on CPAP at bedtime with oxygen.  She wears it every night ,but it broke 3 weeks ago. She is very upset because wants to get back on it as soon as possible. She says she is tired during daytime with low energy  .  She is tearful at visit .   Says she had fall at home 1-2 weeks , tripped in her hallway . Feels sore at times . Was not using her walker  We discussed she needs ov with her PCP to be evaluated.  No LOC . No headache.    Allergies  Allergen Reactions  . Omnipaque [Iohexol] Nausea And Vomiting    Contrast Dye- Effects Kidney's    There is no immunization history for the selected administration types on file for this patient.  Past Medical History:  Diagnosis Date  . Anemia   . Arthritis Dec. 2014   Gout  . Asthma   . CHF (congestive heart failure) ([place])   . COPD (chronic obstructive pulmonary disease) ([place])   . Diabetes mellitus   . Heart failure   . Hyperlipidemia   . Hypertension   . Kidney disease   . Morbid obesity ([place])   . Peripheral vascular disease ([place])   . Pinched nerve   . Sleep apnea     Tobacco History: Social History   Tobacco Use  Smoking Status Never Smoker  Smokeless Tobacco Never Used   Counseling given: Not Answered   Outpatient Encounter Medications  as of 02/08/2018  Medication Sig  . albuterol (PROVENTIL HFA;VENTOLIN HFA) 108 (90 BASE) MCG/ACT inhaler Inhale 1-2 puffs into the lungs every 6 (six) hours as needed for wheezing.  Marland Kitchen allopurinol (ZYLOPRIM) 100 MG tablet Take 1 tablet (100 mg total) by mouth at bedtime.  Marland Kitchen aspirin 81 MG tablet Take 81 mg by mouth daily.    . butalbital-acetaminophen-caffeine (FIORICET, ESGIC) 50-325-40 MG tablet Take 1-2 tablets by mouth every 6 (six) hours as needed for headache.  . calcitRIOL (ROCALTROL) 0.25 MCG capsule Take 0.25 mcg by mouth daily.  . cyclobenzaprine (FLEXERIL) 10 MG tablet TAKE ONE (1) TABLET BY MOUTH 3 TIMES DAILY AS NEEDED FOR SPASM  . Darbepoetin Alfa (ARANESP) 60 MCG/0.3ML SOSY injection Inject 0.3 mLs (60 mcg total) into the skin every Thursday at 6pm.  . ferrous sulfate 325 (65 FE) MG tablet Take 1 tablet (325 mg total) by mouth 2 (two) times daily with a meal.  . furosemide (LASIX) 80 MG tablet Take 1 tablet (80 mg total) by mouth 2 (two) times daily.  . hydrALAZINE (APRESOLINE) 25 MG tablet Take 1 tablet (25 mg total) by mouth 2 (two) times daily.  Marland Kitchen HYDROcodone-homatropine (HYCODAN) 5-1.5 MG/5ML syrup Take 5 mLs by mouth every 6 (six) hours as needed for cough.  Marland Kitchen  insulin aspart (NOVOLOG) 100 UNIT/ML injection Inject 20 Units into the skin 3 (three) times daily with meals.   . insulin glargine (LANTUS) 100 UNIT/ML injection Inject 60 Units into the skin at bedtime.   Marland Kitchen latanoprost (XALATAN) 0.005 % ophthalmic solution Place 1 drop into both eyes at bedtime.  . metolazone (ZAROXOLYN) 2.5 MG tablet Take 1 tablet (2.5 mg total) by mouth 2 (two) times daily.  . metoprolol (LOPRESSOR) 50 MG tablet Take 25 mg by mouth 2 (two) times daily.   . Omega-3 Fatty Acids (FISH OIL PO) Take 1 capsule by mouth daily.  . pantoprazole (PROTONIX) 40 MG tablet Take 1 tablet (40 mg total) by mouth daily.  . rosuvastatin (CRESTOR) 40 MG tablet Take 40 mg by mouth every evening.   . [DISCONTINUED]  amoxicillin-clavulanate (AUGMENTIN) 875-125 MG tablet Take 1 tablet by mouth every 12 (twelve) hours. (Patient not taking: Reported on 02/08/2018)  . [DISCONTINUED] oxyCODONE-acetaminophen (PERCOCET/ROXICET) 5-325 MG tablet Take 2 tablets by mouth every 4 (four) hours as needed for severe pain. (Patient not taking: Reported on 02/08/2018)  . [DISCONTINUED] predniSONE (DELTASONE) 20 MG tablet Two daily with food (Patient not taking: Reported on 02/08/2018)   No facility-administered encounter medications on file as of 02/08/2018.      Review of Systems  Constitutional:   No  weight loss, night sweats,  Fevers, chills,  +fatigue, or  lassitude.  HEENT:   No headaches,  Difficulty swallowing,  Tooth/dental problems, or  Sore throat,                No sneezing, itching, ear ache, nasal congestion, post nasal drip,   CV:  No chest pain,  Orthopnea, PND, , anasarca, dizziness, palpitations, syncope.   GI  No heartburn, indigestion, abdominal pain, nausea, vomiting, diarrhea, change in bowel habits, loss of appetite, bloody stools.   Resp: No shortness of breath with exertion or at rest.  No excess mucus, no productive cough,  No non-productive cough,  No coughing up of blood.  No change in color of mucus.  No wheezing.  No chest wall deformity  Skin: no rash or lesions.  GU: no dysuria, change in color of urine, no urgency or frequency.  No flank pain, no hematuria   MS:  No joint pain or swelling.  No decreased range of motion.  No back pain.    Physical Exam  BP 128/80 (BP Location: Left Arm, Cuff Size: Normal)   Pulse 69   Ht 5\' 2"  (1.575 m)   Wt (!) 341 lb 9.6 oz (154.9 kg)   SpO2 93%   BMI 62.48 kg/m   GEN: A/Ox3; pleasant , NAD, morbid obese    HEENT:  [place]/AT,  EACs-clear, TMs-wnl, NOSE-clear, THROAT-clear, no lesions, no postnasal drip or exudate noted. Class 3 MP airway   NECK:  Supple w/ fair ROM; no JVD; normal carotid impulses w/o bruits; no thyromegaly or nodules  palpated; no lymphadenopathy.    RESP  Clear  P & A; w/o, wheezes/ rales/ or rhonchi. no accessory muscle use, no dullness to percussion  CARD:  RRR, no m/r/g, tr  peripheral edema, pulses intact, no cyanosis or clubbing.  GI:   Soft & nt; nml bowel sounds; no organomegaly or masses detected.   Musco: Warm bil, no deformities or joint swelling noted.   Neuro: alert, no focal deficits noted.    Skin: Warm, no lesions or rashes    Lab Results:  CBC  BNP  Imaging: No results  found.   Assessment & Plan:   OSA (obstructive sleep apnea) Needs to restart CPAP ASAP   Order for new machine    Plan  Patient Instructions  Restart CPAP at bedtime. Order for new CPAP At bedtime   Continue on oxygen At bedtime  Until CPAP is received.  Work on healthy weight Do not drive a sleepy Follow-up in 1 year with Dr. Elsworth Soho and as needed     Severe obesity (BMI >= 40) ([place]) Wt loss   Fall Recent fall at home 1-2 weeks / ? Balance issues , not using walker .  No apparent acute issues , no LOC or head injury  Needs ov with PCP for eval. Pt will make .   Chronic respiratory failure with hypoxia (H[facility]) Patient Instructions  Restart CPAP at bedtime. Order for new CPAP At bedtime   Continue on oxygen At bedtime  Until CPAP is received.  Work on healthy weight Do not drive a sleepy Follow-up in 1 year with Dr. Elsworth Soho and as needed        Rexene Edison, NP 02/08/2018

## 2018-02-08 NOTE — Assessment & Plan Note (Signed)
Needs to restart CPAP ASAP   Order for new machine    Plan  Patient Instructions  Restart CPAP at bedtime. Order for new CPAP At bedtime   Continue on oxygen At bedtime  Until CPAP is received.  Work on healthy weight Do not drive a sleepy Follow-up in 1 year with Dr. Elsworth Soho and as needed

## 2018-02-08 NOTE — Assessment & Plan Note (Signed)
Recent fall at home 1-2 weeks / ? Balance issues , not using walker .  No apparent acute issues , no LOC or head injury  Needs ov with PCP for eval. Pt will make .

## 2018-02-08 NOTE — Assessment & Plan Note (Signed)
Patient Instructions  Restart CPAP at bedtime. Order for new CPAP At bedtime   Continue on oxygen At bedtime  Until CPAP is received.  Work on healthy weight Do not drive a sleepy Follow-up in 1 year with Dr. Elsworth Soho and as needed

## 2018-02-08 NOTE — Assessment & Plan Note (Signed)
Wt loss  

## 2018-02-09 ENCOUNTER — Encounter (HOSPITAL_COMMUNITY): Payer: Medicare Other

## 2018-02-13 ENCOUNTER — Ambulatory Visit (HOSPITAL_COMMUNITY)
Admission: EM | Admit: 2018-02-13 | Discharge: 2018-02-13 | Disposition: A | Discharge status: Home / Self Care | Payer: Medicare Other | Attending: Family Medicine | Admitting: Family Medicine

## 2018-02-13 ENCOUNTER — Ambulatory Visit (HOSPITAL_COMMUNITY)
Admission: RE | Admit: 2018-02-13 | Discharge: 2018-02-13 | Disposition: A | Discharge status: Home / Self Care | Payer: Medicare Other | Source: Ambulatory Visit | Attending: Nephrology | Admitting: Nephrology

## 2018-02-13 ENCOUNTER — Other Ambulatory Visit: Payer: Self-pay

## 2018-02-13 ENCOUNTER — Encounter (HOSPITAL_COMMUNITY): Payer: Self-pay | Admitting: Emergency Medicine

## 2018-02-13 VITALS — BP 150/72 | HR 68 | Temp 98.9°F | Resp 18

## 2018-02-13 DIAGNOSIS — R0602 Shortness of breath: Secondary | ICD-10-CM | POA: Diagnosis not present

## 2018-02-13 DIAGNOSIS — D631 Anemia in chronic kidney disease: Secondary | ICD-10-CM | POA: Diagnosis not present

## 2018-02-13 DIAGNOSIS — R51 Headache: Secondary | ICD-10-CM | POA: Diagnosis not present

## 2018-02-13 DIAGNOSIS — W19XXXA Unspecified fall, initial encounter: Secondary | ICD-10-CM | POA: Diagnosis not present

## 2018-02-13 DIAGNOSIS — M5489 Other dorsalgia: Secondary | ICD-10-CM | POA: Diagnosis not present

## 2018-02-13 DIAGNOSIS — N183 Chronic kidney disease, stage 3 unspecified: Secondary | ICD-10-CM

## 2018-02-13 DIAGNOSIS — R0789 Other chest pain: Secondary | ICD-10-CM

## 2018-02-13 LAB — IRON AND TIBC
IRON: 53 ug/dL (ref 28–170)
SATURATION RATIOS: 25 % (ref 10.4–31.8)
TIBC: 209 ug/dL — AB (ref 250–450)
UIBC: 156 ug/dL

## 2018-02-13 LAB — CBC
HEMATOCRIT: 36.9 % (ref 36.0–46.0)
Hemoglobin: 10.5 g/dL — ABNORMAL LOW (ref 12.0–15.0)
MCH: 26 pg (ref 26.0–34.0)
MCHC: 28.5 g/dL — ABNORMAL LOW (ref 30.0–36.0)
MCV: 91.3 fL (ref 78.0–100.0)
Platelets: 168 10*3/uL (ref 150–400)
RBC: 4.04 MIL/uL (ref 3.87–5.11)
RDW: 15.6 % — ABNORMAL HIGH (ref 11.5–15.5)
WBC: 8.3 10*3/uL (ref 4.0–10.5)

## 2018-02-13 LAB — RENAL FUNCTION PANEL
Albumin: 3.1 g/dL — ABNORMAL LOW (ref 3.5–5.0)
Anion gap: 10 (ref 5–15)
BUN: 50 mg/dL — AB (ref 6–20)
CHLORIDE: 106 mmol/L (ref 101–111)
CO2: 27 mmol/L (ref 22–32)
Calcium: 9.6 mg/dL (ref 8.9–10.3)
Creatinine, Ser: 2.77 mg/dL — ABNORMAL HIGH (ref 0.44–1.00)
GFR calc Af Amer: 20 mL/min — ABNORMAL LOW (ref >60)
GFR, EST NON AFRICAN AMERICAN: 18 mL/min — AB (ref >60)
Glucose, Bld: 161 mg/dL — ABNORMAL HIGH (ref 65–99)
POTASSIUM: 4.6 mmol/L (ref 3.5–5.1)
Phosphorus: 3.1 mg/dL (ref 2.5–4.6)
Sodium: 143 mmol/L (ref 135–145)

## 2018-02-13 LAB — FERRITIN: FERRITIN: 859 ng/mL — AB (ref 11–307)

## 2018-02-13 MED ORDER — DARBEPOETIN ALFA 60 MCG/0.3ML IJ SOSY
PREFILLED_SYRINGE | INTRAMUSCULAR | Status: AC
  Filled 2018-02-13: qty 0.3

## 2018-02-13 MED ORDER — DARBEPOETIN ALFA 150 MCG/0.3ML IJ SOSY
120.0000 ug | PREFILLED_SYRINGE | INTRAMUSCULAR | Status: DC
  Administered 2018-02-13: 120 ug via SUBCUTANEOUS

## 2018-02-13 MED ORDER — CYCLOBENZAPRINE HCL 10 MG PO TABS: 10.0000 mg | ORAL_TABLET | Freq: Two times a day (BID) | ORAL | 0 refills | Status: DC | PRN

## 2018-02-13 NOTE — ED Triage Notes (Signed)
Patient fell last Wednesday.  Patient stumbled and fell forwards, landing on front side of body.  Nose soreness, chest soreness, mid to lower back pain.

## 2018-02-13 NOTE — ED Provider Notes (Signed)
[facility]    CSN: [id number] Arrival date & time: 02/13/18  1043     History   Chief Complaint Chief Complaint  Patient presents with  . Fall    HPI Catherine Williams is a 57 y.o. female history of hypertension, DM type II, CKD, chronic hypoxia, presenting today after a fall with back pain, chest pain, headache.  Patient fell 6 days ago as she was walking out of the door.  She states that she fell forward, hit her face on the door her neck jerked back.  She was able to get up on her own.  Denies loss of consciousness.  Denies any changes in vision, nausea, vomiting.  Since she has had a persistent headache located in bilateral temples region.  Denies this being the worst headache of her life.  Patient does take a daily aspirin 81 mg, is not on any anticoagulation.  She is also had some chest soreness, mid back pain.  She is taken Tylenol extra strength and Flexeril which helped ease the pain.  HPI  Past Medical History:  Diagnosis Date  . Anemia   . Arthritis Dec. 2014   Gout  . Asthma   . CHF (congestive heart failure) ([place])   . COPD (chronic obstructive pulmonary disease) ([place])   . Diabetes mellitus   . Heart failure   . Hyperlipidemia   . Hypertension   . Kidney disease   . Morbid obesity ([place])   . Peripheral vascular disease ([place])   . Pinched nerve   . Sleep apnea     Patient Active Problem List   Diagnosis Date Noted  . Fall 02/08/2018  . Chronic respiratory failure with hypoxia ([place]) 02/08/2018  . Acute renal failure superimposed on stage 4 chronic kidney disease ([place]) 11/01/2016  . Uncontrolled type 2 diabetes mellitus with hyperglycemia, with long-term current use of insulin ([place]) 11/01/2016  . CHF exacerbation ([place]) 10/31/2016  . Neuropathy 10/31/2016  . Diabetes mellitus with complication ([place])   . Shortness of breath 10/23/2016  . Acute on chronic diastolic CHF (congestive heart failure) ([place]) 10/23/2016  . GERD (gastroesophageal reflux disease)  10/23/2016  . Severe obesity (BMI >= 40) ([place]) 08/28/2015  . Dyspnea 08/27/2015  . Type 2 diabetes with nephropathy ([place])   . CKD (chronic kidney disease) stage 3, GFR 30-59 ml/min (HCC) 07/17/2015  . Diabetes mellitus type 2, controlled ([place]) 07/17/2015  . Chronic anemia 07/17/2015  . Hypertension 07/17/2015  . Acute on chronic diastolic heart failure ([place])   . Aftercare following surgery of the circulatory system, [place] 12/30/2013  . Peripheral vascular disease, unspecified ([place]) 05/07/2012  . Visit for wound check 12/19/2011  . OSA (obstructive sleep apnea) 04/05/2011    Past Surgical History:  Procedure Laterality Date  . Atherectomy and angioplasty  10/18/2011   left posterior tibial artery  . HAND SURGERY     right  . OVARY SURGERY     Left  . 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 Authorizing Provider  albuterol (PROVENTIL HFA;VENTOLIN HFA) 108 (90 BASE) MCG/ACT inhaler Inhale 1-2 puffs into the lungs every 6 (six) hours as needed for wheezing. 07/25/13   Harden Mo, MD  allopurinol (ZYLOPRIM) 100 MG tablet Take 1 tablet (100 mg total)  by mouth at bedtime. 11/03/16   Doreatha Lew, MD  aspirin 81 MG tablet Take 81 mg by mouth daily.      [provider]  cyclobenzaprine (FLEXERIL) 10 MG tablet Take 1 tablet (10 mg total) by mouth 2 (two) times daily as needed for muscle spasms. 02/13/18   Eniya Cannady C, PA-C  Darbepoetin Alfa (ARANESP) 60 MCG/0.3ML SOSY injection Inject 0.3 mLs (60 mcg total) into the skin every Thursday at 6pm. 11/03/16   Patrecia Pour, Christean Grief, MD  ferrous sulfate 325 (65 FE) MG tablet Take 1 tablet (325 mg total) by mouth 2 (two) times daily with a meal. 11/03/16   Patrecia Pour, Christean Grief, MD  furosemide (LASIX) 80 MG tablet Take 1 tablet (80 mg total) by mouth 2 (two) times daily.  11/03/16   Doreatha Lew, MD  hydrALAZINE (APRESOLINE) 25 MG tablet Take 1 tablet (25 mg total) by mouth 2 (two) times daily. 07/21/15   Barton Dubois, MD  insulin aspart (NOVOLOG) 100 UNIT/ML injection Inject 20 Units into the skin 3 (three) times daily with meals.     [provider]  insulin glargine (LANTUS) 100 UNIT/ML injection Inject 60 Units into the skin at bedtime.     [provider]  metoprolol (LOPRESSOR) 50 MG tablet Take 25 mg by mouth 2 (two) times daily.     [provider]  Omega-3 Fatty Acids (FISH OIL PO) Take 1 capsule by mouth daily.    [provider]  pantoprazole (PROTONIX) 40 MG tablet Take 1 tablet (40 mg total) by mouth daily. 07/21/15   Barton Dubois, MD  rosuvastatin (CRESTOR) 40 MG tablet Take 40 mg by mouth every evening.     [provider]    Family History Family History  Problem Relation Age of Onset  . Lung cancer Mother   . Clotting disorder Mother   . Heart disease Mother   . Cancer Mother        Lung  . Deep vein thrombosis Mother   . Diabetes Mother   . Hypertension Mother   . Varicose Veins Mother   . Cancer Father   . Clotting disorder Sister   . Heart attack Sister   . Diabetes Sister   . Clotting disorder Brother   . Deep vein thrombosis Brother   . Diabetes Brother   . Heart disease Brother   . Heart disease Sister     Social History Social History   Tobacco Use  . Smoking status: Never Smoker  . Smokeless tobacco: Never Used  Substance Use Topics  . Alcohol use: No    Alcohol/week: 0.0 oz  . Drug use: Yes    Types: Marijuana    Comment: FORMER>>smoked weed from age 51 to 38>> quit at age 85      Allergies   Omnipaque [iohexol]   Review of Systems Review of Systems  Constitutional: Negative for fatigue and fever.  Eyes: Negative for pain and visual disturbance.  Respiratory: Positive for shortness of breath.   Cardiovascular: Positive for chest pain.    Gastrointestinal: Negative for abdominal pain, nausea and vomiting.  Genitourinary: Negative for difficulty urinating.  Musculoskeletal: Positive for back pain, myalgias and neck pain.  Neurological: Positive for headaches. Negative for dizziness, syncope, speech difficulty, weakness, light-headedness and numbness.     Physical Exam Triage Vital Signs ED Triage Vitals  Enc Vitals Group     BP 02/13/18 1136 (!) 158/68     Pulse Rate 02/13/18  1136 67     Resp 02/13/18 1136 (!) 24     Temp 02/13/18 1136 98.7 F (37.1 C)     Temp Source 02/13/18 1136 Oral     SpO2 02/13/18 1136 98 %     Weight --      Height --      Head Circumference --      Peak Flow --      Pain Score 02/13/18 1133 5     Pain Loc --      Pain Edu? --      Excl. in [place]? --    No data found.  Updated Vital Signs BP (!) 158/68 Comment (BP Location): forearm, regular cuff  Pulse 67   Temp 98.7 F (37.1 C) (Oral)   Resp (!) 24   SpO2 98%   Visual Acuity Right Eye Distance:   Left Eye Distance:   Bilateral Distance:    Right Eye Near:   Left Eye Near:    Bilateral Near:     Physical Exam  Constitutional: She is oriented to person, place, and time. She appears well-developed and well-nourished. No distress.  Obese  HENT:  Head: Normocephalic and atraumatic.  Eyes: Conjunctivae and EOM are normal. Pupils are equal, round, and reactive to light.  Neck: Neck supple.  Cardiovascular: Normal rate and regular rhythm.  No murmur heard. Pulmonary/Chest: Effort normal. No respiratory distress.  Decreased breath sounds diffusely across bilateral lung fields, likely related to body habitus; tenderness to palpation of chest along bilateral sternal areas  Abdominal: Soft. There is no tenderness.  Musculoskeletal: She exhibits no edema.  Tenderness to palpation of upper lumbar musculature, mild midline tenderness, pain is worse along palpation of musculature.  Tenderness to palpation of neck musculature, no  cervical spine tenderness.  No thoracic midline tenderness.  Neurological: She is alert and oriented to person, place, and time.  Cranial nerves II through XII grossly intact, strength 5/5 equal bilaterally at shoulders, hips.  Ambulating slowly but without abnormality  Skin: Skin is warm and dry.  Psychiatric: She has a normal mood and affect.  Nursing note and vitals reviewed.    UC Treatments / Results  Labs (all labs ordered are listed, but only abnormal results are displayed) Labs Reviewed - No data to display  EKG  EKG Interpretation None       Radiology No results found.  Procedures Procedures (including critical care time)  Medications Ordered in UC Medications - No data to display   Initial Impression / Assessment and Plan / UC Course  I have reviewed the triage vital signs and the nursing notes.  Pertinent labs & imaging results that were available during my care of the patient were reviewed by me and considered in my medical decision making (see chart for details).     Patient does not appear to have any focal neuro deficit.  Patient with multiple chronic issues.  Will check EKG given chest discomfort and mild shortness of breath.  EKG does not appear to have any acute changes compared to previous EKG in 2017.  Patient with no focal midline tenderness, back pain appears more muscular, and related to weight.  Patient without any vision changes, nausea, vomiting, weakness or difficulty speaking.  Do not feel headache is related to hemorrhage at this time.  But will continue to monitor. Discussed strict return precautions. Patient verbalized understanding and is agreeable with plan.   We will continue to treat conservatively, Tylenol and Flexeril.  Will avoid ibuprofen and other NSAIDs due to CKD.  Final Clinical Impressions(s) / UC Diagnoses   Final diagnoses:  Fall, initial encounter    ED Discharge Orders        Ordered    cyclobenzaprine (FLEXERIL) 10  MG tablet  2 times daily PRN     02/13/18 1245       Controlled Substance Prescriptions [place] Controlled Substance Registry consulted? Not Applicable   Janith Lima, [place] 02/13/18 1255

## 2018-02-13 NOTE — Discharge Instructions (Signed)
Please continue tylenol and flexeril as needed for pain  I expect this to slowly resolve over the next 1-2 weeks.   Please return or go to emergency room if you develop worsening back pain, shortness of breath, chest pain, changes in vision, one-sided weakness, difficulty speaking, persistent headache, worst headache of life.  Please return if symptoms not improving in approximately 1-2 weeks.

## 2018-02-14 LAB — PTH, INTACT AND CALCIUM
CALCIUM TOTAL (PTH): 9.5 mg/dL (ref 8.7–10.2)
PTH: 411 pg/mL — AB (ref 15–65)

## 2018-02-19 DIAGNOSIS — Z6841 Body Mass Index (BMI) 40.0 and over, adult: Secondary | ICD-10-CM | POA: Diagnosis not present

## 2018-02-19 DIAGNOSIS — N2581 Secondary hyperparathyroidism of renal origin: Secondary | ICD-10-CM | POA: Diagnosis not present

## 2018-02-19 DIAGNOSIS — G4730 Sleep apnea, unspecified: Secondary | ICD-10-CM | POA: Diagnosis not present

## 2018-02-19 DIAGNOSIS — R809 Proteinuria, unspecified: Secondary | ICD-10-CM | POA: Diagnosis not present

## 2018-02-19 DIAGNOSIS — E1122 Type 2 diabetes mellitus with diabetic chronic kidney disease: Secondary | ICD-10-CM | POA: Diagnosis not present

## 2018-02-19 DIAGNOSIS — N184 Chronic kidney disease, stage 4 (severe): Secondary | ICD-10-CM | POA: Diagnosis not present

## 2018-02-19 DIAGNOSIS — D509 Iron deficiency anemia, unspecified: Secondary | ICD-10-CM | POA: Diagnosis not present

## 2018-02-19 DIAGNOSIS — I129 Hypertensive chronic kidney disease with stage 1 through stage 4 chronic kidney disease, or unspecified chronic kidney disease: Secondary | ICD-10-CM | POA: Diagnosis not present

## 2018-02-19 DIAGNOSIS — R7989 Other specified abnormal findings of blood chemistry: Secondary | ICD-10-CM | POA: Diagnosis not present

## 2018-02-19 DIAGNOSIS — N179 Acute kidney failure, unspecified: Secondary | ICD-10-CM | POA: Diagnosis not present

## 2018-02-19 DIAGNOSIS — I509 Heart failure, unspecified: Secondary | ICD-10-CM | POA: Diagnosis not present

## 2018-03-02 ENCOUNTER — Telehealth: Payer: Self-pay | Admitting: Adult Health

## 2018-03-02 NOTE — Telephone Encounter (Signed)
Spoke with Melissa with [facility]  She states that they are needed the 02/08/18 ov note by TP to be adended to state pt is using and benefiting from CPAP  This is b/c she has medicare and is being audited  Thanks

## 2018-03-02 NOTE — Telephone Encounter (Signed)
Per TP: note will NOT be addended.  Will not state that patient is benefiting from use because she isn't using it per my office note because the machine broke 3 weeks ago.  The current documentation should be sufficient.  Thanks.

## 2018-03-02 NOTE — Telephone Encounter (Signed)
Called and advised Melissa of TP response. Melissa advised that they would work on it on their end.

## 2018-03-13 ENCOUNTER — Encounter (HOSPITAL_COMMUNITY)
Admission: RE | Admit: 2018-03-13 | Discharge: 2018-03-13 | Disposition: A | Discharge status: Home / Self Care | Payer: Medicare Other | Source: Ambulatory Visit | Attending: Nephrology | Admitting: Nephrology

## 2018-03-13 VITALS — BP 172/88 | HR 74 | Temp 98.4°F | Resp 18

## 2018-03-13 DIAGNOSIS — D631 Anemia in chronic kidney disease: Secondary | ICD-10-CM | POA: Diagnosis not present

## 2018-03-13 DIAGNOSIS — N183 Chronic kidney disease, stage 3 unspecified: Secondary | ICD-10-CM

## 2018-03-13 LAB — RENAL FUNCTION PANEL
Albumin: 3.3 g/dL — ABNORMAL LOW (ref 3.5–5.0)
Anion gap: 10 (ref 5–15)
BUN: 47 mg/dL — AB (ref 6–20)
CALCIUM: 10 mg/dL (ref 8.9–10.3)
CO2: 26 mmol/L (ref 22–32)
Chloride: 106 mmol/L (ref 101–111)
Creatinine, Ser: 2.59 mg/dL — ABNORMAL HIGH (ref 0.44–1.00)
GFR calc Af Amer: 22 mL/min — ABNORMAL LOW (ref >60)
GFR, EST NON AFRICAN AMERICAN: 19 mL/min — AB (ref >60)
GLUCOSE: 76 mg/dL (ref 65–99)
PHOSPHORUS: 3.2 mg/dL (ref 2.5–4.6)
POTASSIUM: 4.4 mmol/L (ref 3.5–5.1)
SODIUM: 142 mmol/L (ref 135–145)

## 2018-03-13 LAB — CBC
HCT: 38.5 % (ref 36.0–46.0)
Hemoglobin: 11 g/dL — ABNORMAL LOW (ref 12.0–15.0)
MCH: 25.5 pg — ABNORMAL LOW (ref 26.0–34.0)
MCHC: 28.6 g/dL — AB (ref 30.0–36.0)
MCV: 89.3 fL (ref 78.0–100.0)
PLATELETS: 252 10*3/uL (ref 150–400)
RBC: 4.31 MIL/uL (ref 3.87–5.11)
RDW: 15.8 % — AB (ref 11.5–15.5)
WBC: 7.8 10*3/uL (ref 4.0–10.5)

## 2018-03-13 MED ORDER — DARBEPOETIN ALFA 150 MCG/0.3ML IJ SOSY
120.0000 ug | PREFILLED_SYRINGE | INTRAMUSCULAR | Status: DC
  Administered 2018-03-13: 120 ug via SUBCUTANEOUS

## 2018-03-13 MED ORDER — DARBEPOETIN ALFA 60 MCG/0.3ML IJ SOSY
PREFILLED_SYRINGE | INTRAMUSCULAR | Status: AC
  Filled 2018-03-13: qty 0.6

## 2018-03-14 MED FILL — Darbepoetin Alfa Soln Prefilled Syringe 60 MCG/0.3ML: INTRAMUSCULAR | Qty: 0.6 | Status: AC

## 2018-04-03 DIAGNOSIS — I10 Essential (primary) hypertension: Secondary | ICD-10-CM | POA: Diagnosis not present

## 2018-04-03 DIAGNOSIS — E119 Type 2 diabetes mellitus without complications: Secondary | ICD-10-CM | POA: Diagnosis not present

## 2018-04-03 DIAGNOSIS — Z79899 Other long term (current) drug therapy: Secondary | ICD-10-CM | POA: Diagnosis not present

## 2018-04-03 DIAGNOSIS — E669 Obesity, unspecified: Secondary | ICD-10-CM | POA: Diagnosis not present

## 2018-04-09 ENCOUNTER — Encounter (HOSPITAL_COMMUNITY): Payer: Medicare Other

## 2018-04-10 ENCOUNTER — Encounter (HOSPITAL_COMMUNITY): Payer: Self-pay

## 2018-04-10 ENCOUNTER — Inpatient Hospital Stay (HOSPITAL_COMMUNITY)
Admission: RE | Admit: 2018-04-10 | Discharge: 2018-04-10 | Disposition: A | Discharge status: Home / Self Care | Payer: Medicare Other | Source: Ambulatory Visit | Attending: Nephrology | Admitting: Nephrology

## 2018-04-26 ENCOUNTER — Ambulatory Visit (HOSPITAL_COMMUNITY)
Admission: RE | Admit: 2018-04-26 | Discharge: 2018-04-26 | Disposition: A | Discharge status: Home / Self Care | Payer: Medicare Other | Source: Ambulatory Visit | Attending: Nephrology | Admitting: Nephrology

## 2018-04-26 VITALS — BP 168/74 | HR 65 | Temp 98.0°F | Resp 18

## 2018-04-26 DIAGNOSIS — D631 Anemia in chronic kidney disease: Secondary | ICD-10-CM | POA: Diagnosis not present

## 2018-04-26 DIAGNOSIS — N183 Chronic kidney disease, stage 3 unspecified: Secondary | ICD-10-CM

## 2018-04-26 LAB — RENAL FUNCTION PANEL
ALBUMIN: 3.2 g/dL — AB (ref 3.5–5.0)
Anion gap: 9 (ref 5–15)
BUN: 49 mg/dL — AB (ref 6–20)
CHLORIDE: 107 mmol/L (ref 101–111)
CO2: 28 mmol/L (ref 22–32)
Calcium: 9.8 mg/dL (ref 8.9–10.3)
Creatinine, Ser: 3.15 mg/dL — ABNORMAL HIGH (ref 0.44–1.00)
GFR, EST AFRICAN AMERICAN: 17 mL/min — AB (ref >60)
GFR, EST NON AFRICAN AMERICAN: 15 mL/min — AB (ref >60)
Glucose, Bld: 96 mg/dL (ref 65–99)
PHOSPHORUS: 3.4 mg/dL (ref 2.5–4.6)
POTASSIUM: 4.3 mmol/L (ref 3.5–5.1)
Sodium: 144 mmol/L (ref 135–145)

## 2018-04-26 LAB — CBC
HEMATOCRIT: 36.2 % (ref 36.0–46.0)
Hemoglobin: 10.6 g/dL — ABNORMAL LOW (ref 12.0–15.0)
MCH: 25.4 pg — ABNORMAL LOW (ref 26.0–34.0)
MCHC: 29.3 g/dL — ABNORMAL LOW (ref 30.0–36.0)
MCV: 86.8 fL (ref 78.0–100.0)
PLATELETS: 180 10*3/uL (ref 150–400)
RBC: 4.17 MIL/uL (ref 3.87–5.11)
RDW: 16.3 % — AB (ref 11.5–15.5)
WBC: 7.2 10*3/uL (ref 4.0–10.5)

## 2018-04-26 LAB — IRON AND TIBC
Iron: 61 ug/dL (ref 28–170)
Saturation Ratios: 29 % (ref 10.4–31.8)
TIBC: 209 ug/dL — ABNORMAL LOW (ref 250–450)
UIBC: 148 ug/dL

## 2018-04-26 LAB — FERRITIN: FERRITIN: 913 ng/mL — AB (ref 11–307)

## 2018-04-26 MED ORDER — DARBEPOETIN ALFA 150 MCG/0.3ML IJ SOSY
120.0000 ug | PREFILLED_SYRINGE | INTRAMUSCULAR | Status: DC
  Administered 2018-04-26: 120 ug via SUBCUTANEOUS

## 2018-04-26 MED ORDER — DARBEPOETIN ALFA 60 MCG/0.3ML IJ SOSY
PREFILLED_SYRINGE | INTRAMUSCULAR | Status: AC
  Filled 2018-04-26: qty 0.6

## 2018-04-27 LAB — PTH, INTACT AND CALCIUM
CALCIUM TOTAL (PTH): 9.7 mg/dL (ref 8.7–10.2)
PTH: 16 pg/mL (ref 15–65)

## 2018-04-27 MED FILL — Darbepoetin Alfa Soln Prefilled Syringe 60 MCG/0.3ML: INTRAMUSCULAR | Qty: 0.6 | Status: AC

## 2018-05-02 DIAGNOSIS — R7989 Other specified abnormal findings of blood chemistry: Secondary | ICD-10-CM | POA: Diagnosis not present

## 2018-05-02 DIAGNOSIS — N2581 Secondary hyperparathyroidism of renal origin: Secondary | ICD-10-CM | POA: Diagnosis not present

## 2018-05-02 DIAGNOSIS — I509 Heart failure, unspecified: Secondary | ICD-10-CM | POA: Diagnosis not present

## 2018-05-02 DIAGNOSIS — G4730 Sleep apnea, unspecified: Secondary | ICD-10-CM | POA: Diagnosis not present

## 2018-05-02 DIAGNOSIS — D509 Iron deficiency anemia, unspecified: Secondary | ICD-10-CM | POA: Diagnosis not present

## 2018-05-02 DIAGNOSIS — R809 Proteinuria, unspecified: Secondary | ICD-10-CM | POA: Diagnosis not present

## 2018-05-02 DIAGNOSIS — E1122 Type 2 diabetes mellitus with diabetic chronic kidney disease: Secondary | ICD-10-CM | POA: Diagnosis not present

## 2018-05-02 DIAGNOSIS — Z6841 Body Mass Index (BMI) 40.0 and over, adult: Secondary | ICD-10-CM | POA: Diagnosis not present

## 2018-05-02 DIAGNOSIS — N179 Acute kidney failure, unspecified: Secondary | ICD-10-CM | POA: Diagnosis not present

## 2018-05-02 DIAGNOSIS — I129 Hypertensive chronic kidney disease with stage 1 through stage 4 chronic kidney disease, or unspecified chronic kidney disease: Secondary | ICD-10-CM | POA: Diagnosis not present

## 2018-05-02 DIAGNOSIS — N184 Chronic kidney disease, stage 4 (severe): Secondary | ICD-10-CM | POA: Diagnosis not present

## 2018-05-08 ENCOUNTER — Encounter (HOSPITAL_COMMUNITY): Payer: Medicare Other

## 2018-05-12 ENCOUNTER — Emergency Department (HOSPITAL_COMMUNITY): Payer: Medicare Other

## 2018-05-12 ENCOUNTER — Other Ambulatory Visit: Payer: Self-pay

## 2018-05-12 ENCOUNTER — Inpatient Hospital Stay (HOSPITAL_COMMUNITY)
Admission: EM | Admit: 2018-05-12 | Discharge: 2018-05-18 | DRG: 291 | Disposition: A | Discharge status: Home Health | Payer: Medicare Other | Attending: Family Medicine | Admitting: Family Medicine

## 2018-05-12 DIAGNOSIS — I130 Hypertensive heart and chronic kidney disease with heart failure and stage 1 through stage 4 chronic kidney disease, or unspecified chronic kidney disease: Secondary | ICD-10-CM | POA: Diagnosis not present

## 2018-05-12 DIAGNOSIS — E872 Acidosis: Secondary | ICD-10-CM | POA: Diagnosis not present

## 2018-05-12 DIAGNOSIS — I509 Heart failure, unspecified: Secondary | ICD-10-CM

## 2018-05-12 DIAGNOSIS — Z8249 Family history of ischemic heart disease and other diseases of the circulatory system: Secondary | ICD-10-CM

## 2018-05-12 DIAGNOSIS — Z91041 Radiographic dye allergy status: Secondary | ICD-10-CM

## 2018-05-12 DIAGNOSIS — E119 Type 2 diabetes mellitus without complications: Secondary | ICD-10-CM

## 2018-05-12 DIAGNOSIS — Z794 Long term (current) use of insulin: Secondary | ICD-10-CM

## 2018-05-12 DIAGNOSIS — J449 Chronic obstructive pulmonary disease, unspecified: Secondary | ICD-10-CM | POA: Diagnosis present

## 2018-05-12 DIAGNOSIS — I2510 Atherosclerotic heart disease of native coronary artery without angina pectoris: Secondary | ICD-10-CM | POA: Diagnosis present

## 2018-05-12 DIAGNOSIS — Z89422 Acquired absence of other left toe(s): Secondary | ICD-10-CM

## 2018-05-12 DIAGNOSIS — Z833 Family history of diabetes mellitus: Secondary | ICD-10-CM

## 2018-05-12 DIAGNOSIS — I10 Essential (primary) hypertension: Secondary | ICD-10-CM | POA: Diagnosis present

## 2018-05-12 DIAGNOSIS — J9611 Chronic respiratory failure with hypoxia: Secondary | ICD-10-CM | POA: Diagnosis present

## 2018-05-12 DIAGNOSIS — I5033 Acute on chronic diastolic (congestive) heart failure: Secondary | ICD-10-CM | POA: Diagnosis not present

## 2018-05-12 DIAGNOSIS — N179 Acute kidney failure, unspecified: Secondary | ICD-10-CM | POA: Diagnosis not present

## 2018-05-12 DIAGNOSIS — I452 Bifascicular block: Secondary | ICD-10-CM | POA: Diagnosis present

## 2018-05-12 DIAGNOSIS — J9621 Acute and chronic respiratory failure with hypoxia: Secondary | ICD-10-CM | POA: Diagnosis not present

## 2018-05-12 DIAGNOSIS — J9622 Acute and chronic respiratory failure with hypercapnia: Secondary | ICD-10-CM | POA: Diagnosis not present

## 2018-05-12 DIAGNOSIS — Z9862 Peripheral vascular angioplasty status: Secondary | ICD-10-CM

## 2018-05-12 DIAGNOSIS — E875 Hyperkalemia: Secondary | ICD-10-CM | POA: Diagnosis not present

## 2018-05-12 DIAGNOSIS — Z6841 Body Mass Index (BMI) 40.0 and over, adult: Secondary | ICD-10-CM | POA: Diagnosis not present

## 2018-05-12 DIAGNOSIS — Z9981 Dependence on supplemental oxygen: Secondary | ICD-10-CM

## 2018-05-12 DIAGNOSIS — E1151 Type 2 diabetes mellitus with diabetic peripheral angiopathy without gangrene: Secondary | ICD-10-CM | POA: Diagnosis present

## 2018-05-12 DIAGNOSIS — Z801 Family history of malignant neoplasm of trachea, bronchus and lung: Secondary | ICD-10-CM

## 2018-05-12 DIAGNOSIS — G4733 Obstructive sleep apnea (adult) (pediatric): Secondary | ICD-10-CM | POA: Diagnosis present

## 2018-05-12 DIAGNOSIS — N184 Chronic kidney disease, stage 4 (severe): Secondary | ICD-10-CM | POA: Diagnosis present

## 2018-05-12 DIAGNOSIS — I110 Hypertensive heart disease with heart failure: Secondary | ICD-10-CM | POA: Diagnosis not present

## 2018-05-12 DIAGNOSIS — E1165 Type 2 diabetes mellitus with hyperglycemia: Secondary | ICD-10-CM | POA: Diagnosis present

## 2018-05-12 DIAGNOSIS — E1122 Type 2 diabetes mellitus with diabetic chronic kidney disease: Secondary | ICD-10-CM | POA: Diagnosis present

## 2018-05-12 DIAGNOSIS — D631 Anemia in chronic kidney disease: Secondary | ICD-10-CM | POA: Diagnosis present

## 2018-05-12 DIAGNOSIS — K219 Gastro-esophageal reflux disease without esophagitis: Secondary | ICD-10-CM | POA: Diagnosis present

## 2018-05-12 DIAGNOSIS — E785 Hyperlipidemia, unspecified: Secondary | ICD-10-CM | POA: Diagnosis present

## 2018-05-12 DIAGNOSIS — R0602 Shortness of breath: Secondary | ICD-10-CM | POA: Diagnosis not present

## 2018-05-12 DIAGNOSIS — I160 Hypertensive urgency: Secondary | ICD-10-CM | POA: Diagnosis present

## 2018-05-12 DIAGNOSIS — Z7982 Long term (current) use of aspirin: Secondary | ICD-10-CM

## 2018-05-12 LAB — CBC
HEMATOCRIT: 37.5 % (ref 36.0–46.0)
Hemoglobin: 10.6 g/dL — ABNORMAL LOW (ref 12.0–15.0)
MCH: 25.3 pg — ABNORMAL LOW (ref 26.0–34.0)
MCHC: 28.3 g/dL — AB (ref 30.0–36.0)
MCV: 89.5 fL (ref 78.0–100.0)
PLATELETS: 229 10*3/uL (ref 150–400)
RBC: 4.19 MIL/uL (ref 3.87–5.11)
RDW: 15.6 % — AB (ref 11.5–15.5)
WBC: 11.7 10*3/uL — AB (ref 4.0–10.5)

## 2018-05-12 LAB — BASIC METABOLIC PANEL
Anion gap: 8 (ref 5–15)
BUN: 45 mg/dL — AB (ref 6–20)
CALCIUM: 9.4 mg/dL (ref 8.9–10.3)
CO2: 27 mmol/L (ref 22–32)
CREATININE: 3.06 mg/dL — AB (ref 0.44–1.00)
Chloride: 104 mmol/L (ref 101–111)
GFR calc Af Amer: 18 mL/min — ABNORMAL LOW (ref >60)
GFR, EST NON AFRICAN AMERICAN: 16 mL/min — AB (ref >60)
GLUCOSE: 208 mg/dL — AB (ref 65–99)
POTASSIUM: 4.8 mmol/L (ref 3.5–5.1)
SODIUM: 139 mmol/L (ref 135–145)

## 2018-05-12 LAB — I-STAT TROPONIN, ED: TROPONIN I, POC: 0.02 ng/mL (ref 0.00–0.08)

## 2018-05-12 LAB — I-STAT BETA HCG BLOOD, ED (MC, WL, AP ONLY): I-stat hCG, quantitative: <5 m[IU]/mL (ref <5)

## 2018-05-12 NOTE — ED Triage Notes (Signed)
Patient c/o SOB, right arm pain, and CP that began 4 days ago. Normally use oxygen at night, 3lpm and uses prn during the day.

## 2018-05-12 NOTE — ED Notes (Signed)
Patient placed on nasal cannula 2lpm

## 2018-05-13 ENCOUNTER — Encounter (HOSPITAL_COMMUNITY): Payer: Self-pay | Admitting: Family Medicine

## 2018-05-13 ENCOUNTER — Encounter (HOSPITAL_COMMUNITY): Payer: Medicare Other

## 2018-05-13 ENCOUNTER — Inpatient Hospital Stay (HOSPITAL_COMMUNITY): Admit: 2018-05-13 | Payer: Medicare Other

## 2018-05-13 DIAGNOSIS — E1151 Type 2 diabetes mellitus with diabetic peripheral angiopathy without gangrene: Secondary | ICD-10-CM | POA: Diagnosis present

## 2018-05-13 DIAGNOSIS — J189 Pneumonia, unspecified organism: Secondary | ICD-10-CM | POA: Insufficient documentation

## 2018-05-13 DIAGNOSIS — J449 Chronic obstructive pulmonary disease, unspecified: Secondary | ICD-10-CM | POA: Diagnosis present

## 2018-05-13 DIAGNOSIS — E1165 Type 2 diabetes mellitus with hyperglycemia: Secondary | ICD-10-CM | POA: Diagnosis present

## 2018-05-13 DIAGNOSIS — I130 Hypertensive heart and chronic kidney disease with heart failure and stage 1 through stage 4 chronic kidney disease, or unspecified chronic kidney disease: Secondary | ICD-10-CM | POA: Diagnosis present

## 2018-05-13 DIAGNOSIS — J9621 Acute and chronic respiratory failure with hypoxia: Secondary | ICD-10-CM | POA: Diagnosis present

## 2018-05-13 DIAGNOSIS — G4733 Obstructive sleep apnea (adult) (pediatric): Secondary | ICD-10-CM | POA: Diagnosis not present

## 2018-05-13 DIAGNOSIS — I160 Hypertensive urgency: Secondary | ICD-10-CM | POA: Diagnosis not present

## 2018-05-13 DIAGNOSIS — F10929 Alcohol use, unspecified with intoxication, unspecified: Secondary | ICD-10-CM | POA: Insufficient documentation

## 2018-05-13 DIAGNOSIS — I2510 Atherosclerotic heart disease of native coronary artery without angina pectoris: Secondary | ICD-10-CM | POA: Diagnosis present

## 2018-05-13 DIAGNOSIS — I5033 Acute on chronic diastolic (congestive) heart failure: Secondary | ICD-10-CM | POA: Diagnosis not present

## 2018-05-13 DIAGNOSIS — J9622 Acute and chronic respiratory failure with hypercapnia: Secondary | ICD-10-CM | POA: Diagnosis present

## 2018-05-13 DIAGNOSIS — I10 Essential (primary) hypertension: Secondary | ICD-10-CM

## 2018-05-13 DIAGNOSIS — N179 Acute kidney failure, unspecified: Secondary | ICD-10-CM | POA: Diagnosis not present

## 2018-05-13 DIAGNOSIS — M7989 Other specified soft tissue disorders: Secondary | ICD-10-CM | POA: Diagnosis not present

## 2018-05-13 DIAGNOSIS — E119 Type 2 diabetes mellitus without complications: Secondary | ICD-10-CM

## 2018-05-13 DIAGNOSIS — E875 Hyperkalemia: Secondary | ICD-10-CM | POA: Diagnosis not present

## 2018-05-13 DIAGNOSIS — E1122 Type 2 diabetes mellitus with diabetic chronic kidney disease: Secondary | ICD-10-CM | POA: Diagnosis present

## 2018-05-13 DIAGNOSIS — K219 Gastro-esophageal reflux disease without esophagitis: Secondary | ICD-10-CM | POA: Diagnosis present

## 2018-05-13 DIAGNOSIS — I5030 Unspecified diastolic (congestive) heart failure: Secondary | ICD-10-CM | POA: Diagnosis not present

## 2018-05-13 DIAGNOSIS — Z801 Family history of malignant neoplasm of trachea, bronchus and lung: Secondary | ICD-10-CM | POA: Diagnosis not present

## 2018-05-13 DIAGNOSIS — E872 Acidosis: Secondary | ICD-10-CM | POA: Diagnosis not present

## 2018-05-13 DIAGNOSIS — Z794 Long term (current) use of insulin: Secondary | ICD-10-CM | POA: Diagnosis not present

## 2018-05-13 DIAGNOSIS — Z9981 Dependence on supplemental oxygen: Secondary | ICD-10-CM | POA: Diagnosis not present

## 2018-05-13 DIAGNOSIS — D631 Anemia in chronic kidney disease: Secondary | ICD-10-CM | POA: Diagnosis present

## 2018-05-13 DIAGNOSIS — I452 Bifascicular block: Secondary | ICD-10-CM | POA: Diagnosis present

## 2018-05-13 DIAGNOSIS — N184 Chronic kidney disease, stage 4 (severe): Secondary | ICD-10-CM | POA: Diagnosis not present

## 2018-05-13 DIAGNOSIS — Z6841 Body Mass Index (BMI) 40.0 and over, adult: Secondary | ICD-10-CM | POA: Diagnosis not present

## 2018-05-13 DIAGNOSIS — I509 Heart failure, unspecified: Secondary | ICD-10-CM | POA: Diagnosis not present

## 2018-05-13 DIAGNOSIS — E785 Hyperlipidemia, unspecified: Secondary | ICD-10-CM | POA: Diagnosis present

## 2018-05-13 LAB — GLUCOSE, CAPILLARY
GLUCOSE-CAPILLARY: 138 mg/dL — AB (ref 65–99)
GLUCOSE-CAPILLARY: 134 mg/dL — AB (ref 65–99)

## 2018-05-13 LAB — TROPONIN I: Troponin I: 0.03 ng/mL (ref <0.03)

## 2018-05-13 LAB — I-STAT TROPONIN, ED: Troponin i, poc: 0 ng/mL (ref 0.00–0.08)

## 2018-05-13 LAB — CBG MONITORING, ED
Glucose-Capillary: 187 mg/dL — ABNORMAL HIGH (ref 65–99)
Glucose-Capillary: 123 mg/dL — ABNORMAL HIGH (ref 65–99)

## 2018-05-13 LAB — URIC ACID: URIC ACID, SERUM: 9.1 mg/dL — AB (ref 2.3–6.6)

## 2018-05-13 LAB — BRAIN NATRIURETIC PEPTIDE: B NATRIURETIC PEPTIDE 5: 78.9 pg/mL (ref 0.0–100.0)

## 2018-05-13 MED ORDER — HYDRALAZINE HCL 25 MG PO TABS
25.0000 mg | ORAL_TABLET | Freq: Three times a day (TID) | ORAL | Status: DC
  Administered 2018-05-13: 25 mg via ORAL
  Filled 2018-05-13: qty 1

## 2018-05-13 MED ORDER — FUROSEMIDE 10 MG/ML IJ SOLN
80.0000 mg | Freq: Once | INTRAMUSCULAR | Status: AC
  Administered 2018-05-13: 80 mg via INTRAVENOUS
  Filled 2018-05-13: qty 8

## 2018-05-13 MED ORDER — ROSUVASTATIN CALCIUM 40 MG PO TABS
40.0000 mg | ORAL_TABLET | Freq: Every evening | ORAL | Status: DC
  Administered 2018-05-13 – 2018-05-15 (×3): 40 mg via ORAL
  Filled 2018-05-13 (×4): qty 1

## 2018-05-13 MED ORDER — CARVEDILOL 6.25 MG PO TABS
6.2500 mg | ORAL_TABLET | Freq: Two times a day (BID) | ORAL | Status: DC
  Administered 2018-05-13 – 2018-05-18 (×11): 6.25 mg via ORAL
  Filled 2018-05-13 (×13): qty 1

## 2018-05-13 MED ORDER — ASPIRIN EC 81 MG PO TBEC
81.0000 mg | DELAYED_RELEASE_TABLET | Freq: Every day | ORAL | Status: DC
  Administered 2018-05-13 – 2018-05-18 (×6): 81 mg via ORAL
  Filled 2018-05-13 (×7): qty 1

## 2018-05-13 MED ORDER — SODIUM CHLORIDE 0.9% FLUSH
3.0000 mL | Freq: Two times a day (BID) | INTRAVENOUS | Status: DC
  Administered 2018-05-13 – 2018-05-18 (×11): 3 mL via INTRAVENOUS

## 2018-05-13 MED ORDER — NITROGLYCERIN 2 % TD OINT: 0.5000 [in_us] | TOPICAL_OINTMENT | Freq: Three times a day (TID) | TRANSDERMAL | Status: DC

## 2018-05-13 MED ORDER — CYCLOBENZAPRINE HCL 10 MG PO TABS
10.0000 mg | ORAL_TABLET | Freq: Two times a day (BID) | ORAL | Status: DC | PRN
Start: 2018-05-13 — End: 2018-05-18

## 2018-05-13 MED ORDER — METOPROLOL TARTRATE 25 MG PO TABS
25.0000 mg | ORAL_TABLET | Freq: Two times a day (BID) | ORAL | Status: DC
  Administered 2018-05-13: 25 mg via ORAL
  Filled 2018-05-13: qty 1

## 2018-05-13 MED ORDER — HYDRALAZINE HCL 20 MG/ML IJ SOLN
10.0000 mg | Freq: Once | INTRAMUSCULAR | Status: AC
  Administered 2018-05-13: 10 mg via INTRAVENOUS
  Filled 2018-05-13: qty 1

## 2018-05-13 MED ORDER — SODIUM CHLORIDE 0.9 % IV SOLN: 250.0000 mL | INTRAVENOUS | Status: DC | PRN

## 2018-05-13 MED ORDER — INSULIN ASPART 100 UNIT/ML ~~LOC~~ SOLN
4.0000 [IU] | Freq: Three times a day (TID) | SUBCUTANEOUS | Status: DC
  Administered 2018-05-13 – 2018-05-14 (×5): 4 [IU] via SUBCUTANEOUS
  Filled 2018-05-13 (×2): qty 1

## 2018-05-13 MED ORDER — INSULIN GLARGINE 100 UNIT/ML ~~LOC~~ SOLN
45.0000 [IU] | Freq: Every day | SUBCUTANEOUS | Status: DC
  Administered 2018-05-13 – 2018-05-16 (×4): 45 [IU] via SUBCUTANEOUS
  Filled 2018-05-13 (×6): qty 0.45

## 2018-05-13 MED ORDER — ISOSORB DINITRATE-HYDRALAZINE 20-37.5 MG PO TABS
1.0000 | ORAL_TABLET | Freq: Three times a day (TID) | ORAL | Status: DC
  Administered 2018-05-13 – 2018-05-18 (×17): 1 via ORAL
  Filled 2018-05-13 (×20): qty 1

## 2018-05-13 MED ORDER — ALBUTEROL SULFATE (2.5 MG/3ML) 0.083% IN NEBU: 2.5000 mg | INHALATION_SOLUTION | Freq: Four times a day (QID) | RESPIRATORY_TRACT | Status: DC | PRN

## 2018-05-13 MED ORDER — ACETAMINOPHEN 325 MG PO TABS
650.0000 mg | ORAL_TABLET | ORAL | Status: DC | PRN
  Administered 2018-05-17: 650 mg via ORAL
  Filled 2018-05-13: qty 2

## 2018-05-13 MED ORDER — HYDRALAZINE HCL 25 MG PO TABS: 25.0000 mg | ORAL_TABLET | Freq: Two times a day (BID) | ORAL | Status: DC

## 2018-05-13 MED ORDER — HYDROCODONE-ACETAMINOPHEN 5-325 MG PO TABS
1.0000 | ORAL_TABLET | Freq: Four times a day (QID) | ORAL | Status: DC | PRN
Start: 2018-05-13 — End: 2018-05-18
  Administered 2018-05-13: 2 via ORAL
  Administered 2018-05-14: 1 via ORAL
  Filled 2018-05-13: qty 2
  Filled 2018-05-13: qty 1

## 2018-05-13 MED ORDER — SODIUM CHLORIDE 0.9% FLUSH: 3.0000 mL | INTRAVENOUS | Status: DC | PRN

## 2018-05-13 MED ORDER — ONDANSETRON HCL 4 MG/2ML IJ SOLN
4.0000 mg | Freq: Four times a day (QID) | INTRAMUSCULAR | Status: DC | PRN
  Administered 2018-05-13: 4 mg via INTRAVENOUS
  Filled 2018-05-13: qty 2

## 2018-05-13 MED ORDER — HYDRALAZINE HCL 20 MG/ML IJ SOLN: 10.0000 mg | INTRAMUSCULAR | Status: DC | PRN

## 2018-05-13 MED ORDER — INSULIN ASPART 100 UNIT/ML ~~LOC~~ SOLN
0.0000 [IU] | Freq: Three times a day (TID) | SUBCUTANEOUS | Status: DC
  Administered 2018-05-13: 3 [IU] via SUBCUTANEOUS
  Administered 2018-05-13 (×2): 2 [IU] via SUBCUTANEOUS
  Administered 2018-05-14: 3 [IU] via SUBCUTANEOUS
  Administered 2018-05-14: 5 [IU] via SUBCUTANEOUS
  Administered 2018-05-14 – 2018-05-15 (×4): 3 [IU] via SUBCUTANEOUS
  Administered 2018-05-16: 2 [IU] via SUBCUTANEOUS
  Administered 2018-05-16: 3 [IU] via SUBCUTANEOUS
  Filled 2018-05-13 (×2): qty 1

## 2018-05-13 MED ORDER — MORPHINE SULFATE (PF) 4 MG/ML IV SOLN
4.0000 mg | Freq: Once | INTRAVENOUS | Status: AC
  Administered 2018-05-13: 4 mg via INTRAVENOUS
  Filled 2018-05-13: qty 1

## 2018-05-13 MED ORDER — FUROSEMIDE 10 MG/ML IJ SOLN
80.0000 mg | Freq: Two times a day (BID) | INTRAMUSCULAR | Status: DC
  Administered 2018-05-13 – 2018-05-14 (×4): 80 mg via INTRAVENOUS
  Filled 2018-05-13 (×4): qty 8

## 2018-05-13 MED ORDER — INSULIN ASPART 100 UNIT/ML ~~LOC~~ SOLN: 0.0000 [IU] | Freq: Every day | SUBCUTANEOUS | Status: DC

## 2018-05-13 MED ORDER — HEPARIN SODIUM (PORCINE) 5000 UNIT/ML IJ SOLN
5000.0000 [IU] | Freq: Three times a day (TID) | INTRAMUSCULAR | Status: DC
  Administered 2018-05-13 – 2018-05-18 (×16): 5000 [IU] via SUBCUTANEOUS
  Filled 2018-05-13 (×16): qty 1

## 2018-05-13 MED ORDER — PANTOPRAZOLE SODIUM 40 MG PO TBEC
40.0000 mg | DELAYED_RELEASE_TABLET | Freq: Every day | ORAL | Status: DC
  Administered 2018-05-13 – 2018-05-18 (×6): 40 mg via ORAL
  Filled 2018-05-13 (×7): qty 1

## 2018-05-13 NOTE — Plan of Care (Signed)
  Problem: Education: Goal: Knowledge of General Education information will improve Outcome: Progressing   Problem: Health Behavior/Discharge Planning: Goal: Ability to manage health-related needs will improve Outcome: Progressing   Problem: Clinical Measurements: Goal: Respiratory complications will improve Outcome: Progressing   Problem: Pain Managment: Goal: General experience of comfort will improve Outcome: Progressing

## 2018-05-13 NOTE — ED Notes (Addendum)
Levada Dy, RN accepts report at this time.

## 2018-05-13 NOTE — ED Notes (Signed)
Attempted IV access, without success.

## 2018-05-13 NOTE — ED Notes (Signed)
HH/carb mod ordered

## 2018-05-13 NOTE — Progress Notes (Signed)
Patient ID: Catherine Williams, female   DOB: 1961/06/23, 57 y.o.   MRN: [id number]                                                                PROGRESS NOTE                                                                                                                                                                                                             Patient Demographics:    Deaira Leckey, is a 57 y.o. female, DOB - July 23, 1961, ENI:[id number]  Admit date - 05/12/2018   Admitting Physician Vianne Bulls, MD  Outpatient Primary MD for the patient is Vonna Drafts, FNP  LOS - 0  Outpatient Specialists:    Kathrene Alu  Chief Complaint  Patient presents with  . Shortness of Breath       Brief Narrative    57 y.o. female with medical history significant for BMI 62, OSA, COPD, chronic diastolic CHF, chronic kidney disease stage IV, and insulin-dependent diabetes mellitus, now presenting to the emergency department for evaluation of shortness of breath, mild chest discomfort, right wrist pain, and orthopnea.  Patient reports that the symptoms developed approximately 4 days ago and have progressively worsened.  She denies fevers or chills, and she has not noted any alleviating or exacerbating factors.  In terms of the right wrist pain, she denies recent fall or trauma.  She reports history of gout in the lower extremities, but never in the upper extremity.  ED Course: Upon arrival to the ED, patient is found to be afebrile, saturating 90 on room air, slightly tachypneic, and hypertensive with systolic pressure of 614.  EKG features a sinus rhythm with RBBB, LAFB, and LVH with repolarization abnormality.  Chest x-ray is notable for cardiomegaly with vascular congestion and interstitial edema.  Chemistry panel is notable for a BUN of 45 and creatinine 3.06, consistent with her apparent baseline.  CBC is notable for a chronic and stable normocytic anemia.  Troponin is negative  x2.  Patient was treated with 80 mg IV Lasix and IV hydralazine in the ED.  Supplemental oxygen was administered.  She remains hemodynamically stable and will be admitted to the telemetry unit for ongoing evaluation and management.  Subjective:    Yukiko Minnich today is feeling better.  No further chest pain.  Breathing is better. Pt is not sure about her weight. Pt admits that her lasix was recently cut back.   No headache,  No abdominal pain - No Nausea, No new weakness tingling or numbness   Assessment  & Plan :    Principal Problem:   Acute on chronic diastolic CHF (congestive heart failure) (HCC) Active Problems:   OSA (obstructive sleep apnea)   CKD (chronic kidney disease), stage IV (HCC)   Insulin-requiring or dependent type II diabetes mellitus ([place])   Hypertension   Chronic respiratory failure with hypoxia (HCC)   Hypertensive urgency    1. Acute on chronic diastolic CHF  - Presents with SOB, orthopnea, and mild chest discomfort  - Noted to peripheral edema and edema on CXR  - Remote echo with preserved EF, grade 1 diastolic dysfunction  - Treated with Lasix 80 mg IV in ED  - Continue with Lasix 80 mg IV q12 - Cont Metoprolol 25mg  po bid -Start ntp 0.5inch topically q8h -trop I q6h x3 -cardiac echo  2. CKD stage IV  - SCr is 3.06 on admission, consistent with her apparent baseline  - Renally-dose medications, avoid nephrotoxins, follow daily chem panel during diuresis    3. Insulin-dependent DM  - A1c was 8.0% in November 2017  - Managed at home with Lantus 60 units qHS and Novolog 20 units TID  - Follow CBG's, continue Lantus and Novolog with dose-reduction to start   4. OSA  - Continue CPAP qHS    5. Hypertension with hypertensive urgency  - SBP 200 in ED  - Hypervolemia likely contributing; anticipate improvement with diuresis  -increase hydralazine to 25mg  po tid -hydralazine 10mg  iv q6h prn sbp >160  6. Right wrist pain and swelling  - Right  wrist tender with swelling for several days; no recent fall or trauma  - Hx of gout in LE's, but never wrist  - Check uric acid, check venous US for DVT in light of SOB and chest pain pta  7. Chest pain Tele Trop I q6hx 3 Check cardiac echo Cardiology consult regarding chest pain   DVT prophylaxis: sq heparin  Code Status: Full  Family Communication: Discussed with patient Consults called: None Admission status: Inpatient         Lab Results  Component Value Date   PLT 229 05/12/2018    Antibiotics  :    Anti-infectives (From admission, onward)   None        Objective:   Vitals:   05/13/18 0600 05/13/18 0607 05/13/18 0630 05/13/18 0700  BP: (!) 173/69 (!) 173/69 (!) 177/73 (!) 180/65  Pulse: 90 90 92 93  Resp: 11 (!) 21 17 20   Temp:      TempSrc:      SpO2: 93% 96% 96% 96%  Weight:      Height:        Wt Readings from Last 3 Encounters:  05/13/18 (!) 148.3 kg (327 lb)  02/08/18 (!) 154.9 kg (341 lb 9.6 oz)  09/21/17 (!) 144.2 kg (318 lb)     Intake/Output Summary (Last 24 hours) at 05/13/2018 0707 Last data filed at 05/13/2018 0436 Gross per 24 hour  Intake -  Output 550 ml  Net -550 ml     Physical Exam  Awake Alert, Oriented X 3, No new F.N deficits, Normal affect [place].AT,PERRAL Supple Neck,difficult to apprecaite JVD, No cervical lymphadenopathy  appriciated.  Symmetrical Chest wall movement, Good air movement bilaterally, few crackles bilateral base, no wheezing RRR,No Gallops,Rubs or new Murmurs, No Parasternal Heave +ve B.Sounds, Abd Soft, No tenderness, No organomegaly appriciated, No rebound - guarding or rigidity. No Cyanosis, Clubbing .  1+ edema.   No new Rash or bruise      Data Review:    CBC Recent Labs  Lab 05/12/18 2207  WBC 11.7*  HGB 10.6*  HCT 37.5  PLT 229  MCV 89.5  MCH 25.3*  MCHC 28.3*  RDW 15.6*    Chemistries  Recent Labs  Lab 05/12/18 2207  NA 139  K 4.8  CL 104  CO2 27  GLUCOSE 208*  BUN 45*    CREATININE 3.06*  CALCIUM 9.4   ------------------------------------------------------------------------------------------------------------------ No results for input(s): CHOL, HDL, LDLCALC, TRIG, CHOLHDL, LDLDIRECT in the last 72 hours.  Lab Results  Component Value Date   HGBA1C 8.0 (H) 10/23/2016   ------------------------------------------------------------------------------------------------------------------ No results for input(s): TSH, T4TOTAL, T3FREE, THYROIDAB in the last 72 hours.  Invalid input(s): FREET3 ------------------------------------------------------------------------------------------------------------------ No results for input(s): VITAMINB12, FOLATE, FERRITIN, TIBC, IRON, RETICCTPCT in the last 72 hours.  Coagulation profile No results for input(s): INR, PROTIME in the last 168 hours.  No results for input(s): DDIMER in the last 72 hours.  Cardiac Enzymes No results for input(s): CKMB, TROPONINI, MYOGLOBIN in the last 168 hours.  Invalid input(s): CK ------------------------------------------------------------------------------------------------------------------    Component Value Date/Time   BNP 78.9 05/13/2018 0333    Inpatient Medications  Scheduled Meds: . aspirin EC  81 mg Oral Daily  . furosemide  80 mg Intravenous BID  . heparin  5,000 Units Subcutaneous Q8H  . hydrALAZINE  25 mg Oral BID  . insulin aspart  0-15 Units Subcutaneous TID WC  . insulin aspart  0-5 Units Subcutaneous QHS  . insulin aspart  4 Units Subcutaneous TID WC  . insulin glargine  45 Units Subcutaneous QHS  . metoprolol tartrate  25 mg Oral BID  . pantoprazole  40 mg Oral Daily  . rosuvastatin  40 mg Oral QPM  . sodium chloride flush  3 mL Intravenous Q12H   Continuous Infusions: . sodium chloride     PRN Meds:.sodium chloride, acetaminophen, albuterol, cyclobenzaprine, hydrALAZINE, HYDROcodone-acetaminophen, ondansetron (ZOFRAN) IV, sodium chloride  flush  Micro Results No results found for this or any previous visit (from the past 240 hour(s)).  Radiology Reports Dg Chest 2 View  Result Date: 05/12/2018 CLINICAL DATA:  Short of breath EXAM: CHEST - 2 VIEW COMPARISON:  11/01/2016 FINDINGS: Mild cardiomegaly with vascular congestion and mild interstitial edema. No pleural effusion. No focal consolidation. No pneumothorax. IMPRESSION: Cardiomegaly with vascular congestion and mild interstitial pulmonary edema. Electronically Signed   By: Donavan Foil M.D.   On: 05/12/2018 22:38    Time Spent in minutes  30   Jani Gravel M.D on 05/13/2018 at 7:07 AM  Between 7am to 7pm - Pager - [phone]    After 7pm go to [email or web address] - password [email or web address]  [facility] -  Office  [phone]

## 2018-05-13 NOTE — H&P (Signed)
History and Physical    Catherine Williams ENI:[id number] DOB: August 30, 1961 DOA: 05/12/2018  PCP: Vonna Drafts, FNP   Patient coming from: Home   Chief Complaint: SOB, cough, orthopnea   HPI: Catherine Williams is a 57 y.o. female with medical history significant for BMI 62, OSA, COPD, chronic diastolic CHF, chronic kidney disease stage IV, and insulin-dependent diabetes mellitus, now presenting to the emergency department for evaluation of shortness of breath, mild chest discomfort, right wrist pain, and orthopnea.  Patient reports that the symptoms developed approximately 4 days ago and have progressively worsened.  She denies fevers or chills, and she has not noted any alleviating or exacerbating factors.  In terms of the right wrist pain, she denies recent fall or trauma.  She reports history of gout in the lower extremities, but never in the upper extremity.  ED Course: Upon arrival to the ED, patient is found to be afebrile, saturating 90 on room air, slightly tachypneic, and hypertensive with systolic pressure of 614.  EKG features a sinus rhythm with RBBB, LAFB, and LVH with repolarization abnormality.  Chest x-ray is notable for cardiomegaly with vascular congestion and interstitial edema.  Chemistry panel is notable for a BUN of 45 and creatinine 3.06, consistent with her apparent baseline.  CBC is notable for a chronic and stable normocytic anemia.  Troponin is negative x2.  Patient was treated with 80 mg IV Lasix and IV hydralazine in the ED.  Supplemental oxygen was administered.  She remains hemodynamically stable and will be admitted to the telemetry unit for ongoing evaluation and management.  Review of Systems:  All other systems reviewed and apart from HPI, are negative.  Past Medical History:  Diagnosis Date  . Anemia   . Arthritis Dec. 2014   Gout  . Asthma   . CHF (congestive heart failure) ([place])   . COPD (chronic obstructive pulmonary disease) ([place])   . Diabetes mellitus     . Heart failure   . Hyperlipidemia   . Hypertension   . Kidney disease   . Morbid obesity ([place])   . Peripheral vascular disease ([place])   . Pinched nerve   . Sleep apnea     Past Surgical History:  Procedure Laterality Date  . Atherectomy and angioplasty  10/18/2011   left posterior tibial artery  . HAND SURGERY     right  . OVARY SURGERY     Left  . TOE AMPUTATION  Sept. 25,2012   Left 4th and 5th toes     reports that she has never smoked. She has never used smokeless tobacco. She reports that she has current or past drug history. Drug: Marijuana. She reports that she does not drink alcohol.  Allergies  Allergen Reactions  . Omnipaque [Iohexol] Nausea And Vomiting    Contrast Dye- Effects Kidney's    Family History  Problem Relation Age of Onset  . Lung cancer Mother   . Clotting disorder Mother   . Heart disease Mother   . Cancer Mother        Lung  . Deep vein thrombosis Mother   . Diabetes Mother   . Hypertension Mother   . Varicose Veins Mother   . Cancer Father   . Clotting disorder Sister   . Heart attack Sister   . Diabetes Sister   . Clotting disorder Brother   . Deep vein thrombosis Brother   . Diabetes Brother   . Heart disease Brother   . Heart disease  Sister      Prior to Admission medications   Medication Sig Start Date End Date Taking? Authorizing Provider  albuterol (PROVENTIL HFA;VENTOLIN HFA) 108 (90 BASE) MCG/ACT inhaler Inhale 1-2 puffs into the lungs every 6 (six) hours as needed for wheezing. 07/25/13  Yes Harden Mo, MD  aspirin 81 MG tablet Take 81 mg by mouth daily.     Yes [provider]  cyclobenzaprine (FLEXERIL) 10 MG tablet Take 1 tablet (10 mg total) by mouth 2 (two) times daily as needed for muscle spasms. 02/13/18  Yes Wieters, Hallie C, PA-C  Darbepoetin Alfa (ARANESP) 60 MCG/0.3ML SOSY injection Inject 0.3 mLs (60 mcg total) into the skin every Thursday at 6pm. Patient taking differently: Inject 60 mcg into the  skin every 30 (thirty) days.  11/03/16  Yes Patrecia Pour, Christean Grief, MD  furosemide (LASIX) 80 MG tablet Take 1 tablet (80 mg total) by mouth 2 (two) times daily. Patient taking differently: Take 40-80 mg by mouth See admin instructions. Take 1 tablet in the morning, and 1/2 tablet in the evening 11/03/16  Yes Patrecia Pour, Christean Grief, MD  hydrALAZINE (APRESOLINE) 25 MG tablet Take 1 tablet (25 mg total) by mouth 2 (two) times daily. Patient taking differently: Take 50 mg by mouth daily.  07/21/15  Yes Barton Dubois, MD  insulin aspart (NOVOLOG) 100 UNIT/ML injection Inject 20 Units into the skin 3 (three) times daily with meals.    Yes [provider]  insulin glargine (LANTUS) 100 UNIT/ML injection Inject 60 Units into the skin at bedtime.    Yes [provider]  loratadine (CLARITIN) 10 MG tablet Take 10 mg by mouth daily.   Yes [provider]  metoprolol (LOPRESSOR) 50 MG tablet Take 25 mg by mouth 2 (two) times daily.    Yes [provider]  pantoprazole (PROTONIX) 40 MG tablet Take 1 tablet (40 mg total) by mouth daily. 07/21/15  Yes Barton Dubois, MD  rosuvastatin (CRESTOR) 40 MG tablet Take 40 mg by mouth every evening.    Yes [provider]  allopurinol (ZYLOPRIM) 100 MG tablet Take 1 tablet (100 mg total) by mouth at bedtime. Patient not taking: Reported on 05/13/2018 11/03/16   Patrecia Pour, Christean Grief, MD  ferrous sulfate 325 (65 FE) MG tablet Take 1 tablet (325 mg total) by mouth 2 (two) times daily with a meal. Patient not taking: Reported on 05/13/2018 11/03/16   Patrecia Pour, Christean Grief, MD    Physical Exam: Vitals:   05/13/18 0400 05/13/18 0415 05/13/18 0430 05/13/18 0445  BP:      Pulse: 93 82 83 84  Resp: 20 (!) 24 (!) 25 (!) 24  Temp:      TempSrc:      SpO2: 94% 98% 97% 99%  Weight:      Height:          Constitutional: NAD, calm  Eyes: PERTLA, lids and conjunctivae normal ENMT: Mucous membranes are moist. Posterior pharynx clear of any  exudate or lesions.   Neck: normal, supple, no masses, no thyromegaly Respiratory: No definite rales or rhonchi though exam limited by body habitus. No accessory muscle use.  Cardiovascular: S1 & S2 heard, regular rate and rhythm. Pretibial pitting edema bilaterally. Abdomen: No distension, no tenderness, soft. Bowel sounds normal.  Musculoskeletal: no clubbing / cyanosis. Distal RUE edematous and tender about the wrist.  Skin: no significant rashes, lesions, ulcers. Warm, dry, well-perfused. Neurologic: CN 2-12 grossly intact. Sensation intact. Strength 5/5 in all 4  limbs.  Psychiatric: Alert and oriented x 3. Calm and cooperative.     Labs on Admission: I have personally reviewed following labs and imaging studies  CBC: Recent Labs  Lab 05/12/18 2207  WBC 11.7*  HGB 10.6*  HCT 37.5  MCV 89.5  PLT 902   Basic Metabolic Panel: Recent Labs  Lab 05/12/18 2207  NA 139  K 4.8  CL 104  CO2 27  GLUCOSE 208*  BUN 45*  CREATININE 3.06*  CALCIUM 9.4   GFR: Estimated Creatinine Clearance: 27.5 mL/min (A) (by C-G formula based on SCr of 3.06 mg/dL (H)). Liver Function Tests: No results for input(s): AST, ALT, ALKPHOS, BILITOT, PROT, ALBUMIN in the last 168 hours. No results for input(s): LIPASE, AMYLASE in the last 168 hours. No results for input(s): AMMONIA in the last 168 hours. Coagulation Profile: No results for input(s): INR, PROTIME in the last 168 hours. Cardiac Enzymes: No results for input(s): CKTOTAL, CKMB, CKMBINDEX, TROPONINI in the last 168 hours. BNP (last 3 results) No results for input(s): PROBNP in the last 8760 hours. HbA1C: No results for input(s): HGBA1C in the last 72 hours. CBG: No results for input(s): GLUCAP in the last 168 hours. Lipid Profile: No results for input(s): CHOL, HDL, LDLCALC, TRIG, CHOLHDL, LDLDIRECT in the last 72 hours. Thyroid Function Tests: No results for input(s): TSH, T4TOTAL, FREET4, T3FREE, THYROIDAB in the last 72  hours. Anemia Panel: No results for input(s): VITAMINB12, FOLATE, FERRITIN, TIBC, IRON, RETICCTPCT in the last 72 hours. Urine analysis:    Component Value Date/Time   COLORURINE YELLOW 10/31/2016 1844   APPEARANCEUR HAZY (A) 10/31/2016 1844   LABSPEC 1.014 10/31/2016 1844   PHURINE 5.0 10/31/2016 1844   GLUCOSEU NEGATIVE 10/31/2016 1844   HGBUR NEGATIVE 10/31/2016 1844   BILIRUBINUR NEGATIVE 10/31/2016 1844   KETONESUR NEGATIVE 10/31/2016 1844   PROTEINUR 100 (A) 10/31/2016 1844   UROBILINOGEN 0.2 07/19/2015 1939   NITRITE NEGATIVE 10/31/2016 1844   LEUKOCYTESUR NEGATIVE 10/31/2016 1844   Sepsis Labs: @LABRCNTIP (procalcitonin:4,lacticidven:4) )No results found for this or any previous visit (from the past 240 hour(s)).   Radiological Exams on Admission: Dg Chest 2 View  Result Date: 05/12/2018 CLINICAL DATA:  Short of breath EXAM: CHEST - 2 VIEW COMPARISON:  11/01/2016 FINDINGS: Mild cardiomegaly with vascular congestion and mild interstitial edema. No pleural effusion. No focal consolidation. No pneumothorax. IMPRESSION: Cardiomegaly with vascular congestion and mild interstitial pulmonary edema. Electronically Signed   By: Donavan Foil M.D.   On: 05/12/2018 22:38    EKG: Independently reviewed. Sinus rhythm, RBBB, LAFB, LVH with repolarization abnormality.   Assessment/Plan  1. Acute on chronic diastolic CHF  - Presents with SOB, orthopnea, and mild chest discomfort  - Noted to peripheral edema and edema on CXR  - Remote echo with preserved EF, grade 1 diastolic dysfunction  - Treated with Lasix 80 mg IV in ED  - Continue diuresis with Lasix 80 mg IV q12h, continue beta-blocker, continue cardiac monitoring, update echocardiogram, SLIV, follow daily wt and I/O's    2. CKD stage IV  - SCr is 3.06 on admission, consistent with her apparent baseline  - Renally-dose medications, avoid nephrotoxins, follow daily chem panel during diuresis    3. Insulin-dependent DM  - A1c  was 8.0% in November 2017  - Managed at home with Lantus 60 units qHS and Novolog 20 units TID  - Follow CBG's, continue Lantus and Novolog with dose-reduction to start   4. OSA  - Continue CPAP qHS  5. Hypertension with hypertensive urgency  - SBP 200 in ED  - Hypervolemia likely contributing; anticipate improvement with diuresis  - Continue Lopressor and hydralazine, use hydralazine IVP's as needed    6. Right wrist pain and swelling  - Right wrist tender with swelling for several days; no recent fall or trauma  - Hx of gout in LE's, but never wrist  - Check uric acid, check venous US for DVT in light of SOB and chest pain pta   DVT prophylaxis: sq heparin  Code Status: Full  Family Communication: Discussed with patient Consults called: None Admission status: Inpatient    Vianne Bulls, MD [facility] Pager [phone]  If 7PM-7AM, please contact night-coverage [email or web address] Password [email or web address]  05/13/2018, 5:11 AM

## 2018-05-13 NOTE — ED Triage Notes (Signed)
CBG 187 

## 2018-05-13 NOTE — ED Notes (Signed)
Accepting nurse requests call back in 10 minutes

## 2018-05-13 NOTE — ED Provider Notes (Addendum)
[facility] EMERGENCY DEPARTMENT Provider Note   CSN: [id number] Arrival date & time: 05/12/18  2151     History   Chief Complaint Chief Complaint  Patient presents with  . Shortness of Breath    HPI Catherine Williams is a 57 y.o. female.  HPI 57 yo female with a hx of CHF, COPD, and asthma presenting to ER with increasing exertional SOB over the past 3-4 days with some central CP. No fevers. Reports productive cough and new orthopnea. No hx of PE or DVT. Has had some increasing pain with ROM of her right wrist and has a hx of gout. Reports self imobolizing her RUE. Compliant with meds, reports good dietary choices. Does report eating a hot dog today   Past Medical History:  Diagnosis Date  . Anemia   . Arthritis Dec. 2014   Gout  . Asthma   . CHF (congestive heart failure) ([place])   . COPD (chronic obstructive pulmonary disease) ([place])   . Diabetes mellitus   . Heart failure   . Hyperlipidemia   . Hypertension   . Kidney disease   . Morbid obesity ([place])   . Peripheral vascular disease ([place])   . Pinched nerve   . Sleep apnea     Patient Active Problem List   Diagnosis Date Noted  . Fall 02/08/2018  . Chronic respiratory failure with hypoxia ([place]) 02/08/2018  . Acute renal failure superimposed on stage 4 chronic kidney disease ([place]) 11/01/2016  . Uncontrolled type 2 diabetes mellitus with hyperglycemia, with long-term current use of insulin ([place]) 11/01/2016  . CHF exacerbation ([place]) 10/31/2016  . Neuropathy 10/31/2016  . Diabetes mellitus with complication ([place])   . Shortness of breath 10/23/2016  . Acute on chronic diastolic CHF (congestive heart failure) ([place]) 10/23/2016  . GERD (gastroesophageal reflux disease) 10/23/2016  . Severe obesity (BMI >= 40) ([place]) 08/28/2015  . Dyspnea 08/27/2015  . Type 2 diabetes with nephropathy ([place])   . CKD (chronic kidney disease) stage 3, GFR 30-59 ml/min (H[facility]) 07/17/2015  . Diabetes mellitus type 2, controlled  ([place]) 07/17/2015  . Chronic anemia 07/17/2015  . Hypertension 07/17/2015  . Acute on chronic diastolic heart failure ([place])   . Aftercare following surgery of the circulatory system, [place] 12/30/2013  . Peripheral vascular disease, unspecified ([place]) 05/07/2012  . Visit for wound check 12/19/2011  . OSA (obstructive sleep apnea) 04/05/2011    Past Surgical History:  Procedure Laterality Date  . Atherectomy and angioplasty  10/18/2011   left posterior tibial artery  . HAND SURGERY     right  . OVARY SURGERY     Left  . TOE AMPUTATION  Sept. 25,2012   Left 4th and 5th toes     OB History   None      Home Medications    Prior to Admission medications   Medication Sig Start Date End Date Taking? Authorizing Provider  albuterol (PROVENTIL HFA;VENTOLIN HFA) 108 (90 BASE) MCG/ACT inhaler Inhale 1-2 puffs into the lungs every 6 (six) hours as needed for wheezing. 07/25/13   Harden Mo, MD  allopurinol (ZYLOPRIM) 100 MG tablet Take 1 tablet (100 mg total) by mouth at bedtime. 11/03/16   Doreatha Lew, MD  aspirin 81 MG tablet Take 81 mg by mouth daily.      [provider]  cyclobenzaprine (FLEXERIL) 10 MG tablet Take 1 tablet (10 mg total) by mouth 2 (two) times daily as needed for muscle spasms. 02/13/18  Wieters, Hallie C, PA-C  Darbepoetin Alfa (ARANESP) 60 MCG/0.3ML SOSY injection Inject 0.3 mLs (60 mcg total) into the skin every Thursday at 6pm. 11/03/16   Patrecia Pour, Christean Grief, MD  ferrous sulfate 325 (65 FE) MG tablet Take 1 tablet (325 mg total) by mouth 2 (two) times daily with a meal. 11/03/16   Patrecia Pour, Christean Grief, MD  furosemide (LASIX) 80 MG tablet Take 1 tablet (80 mg total) by mouth 2 (two) times daily. 11/03/16   Doreatha Lew, MD  hydrALAZINE (APRESOLINE) 25 MG tablet Take 1 tablet (25 mg total) by mouth 2 (two) times daily. 07/21/15   Barton Dubois, MD  insulin aspart (NOVOLOG) 100 UNIT/ML injection Inject 20 Units into the skin 3 (three) times daily  with meals.     [provider]  insulin glargine (LANTUS) 100 UNIT/ML injection Inject 60 Units into the skin at bedtime.     [provider]  metoprolol (LOPRESSOR) 50 MG tablet Take 25 mg by mouth 2 (two) times daily.     [provider]  Omega-3 Fatty Acids (FISH OIL PO) Take 1 capsule by mouth daily.    [provider]  pantoprazole (PROTONIX) 40 MG tablet Take 1 tablet (40 mg total) by mouth daily. 07/21/15   Barton Dubois, MD  rosuvastatin (CRESTOR) 40 MG tablet Take 40 mg by mouth every evening.     [provider]    Family History Family History  Problem Relation Age of Onset  . Lung cancer Mother   . Clotting disorder Mother   . Heart disease Mother   . Cancer Mother        Lung  . Deep vein thrombosis Mother   . Diabetes Mother   . Hypertension Mother   . Varicose Veins Mother   . Cancer Father   . Clotting disorder Sister   . Heart attack Sister   . Diabetes Sister   . Clotting disorder Brother   . Deep vein thrombosis Brother   . Diabetes Brother   . Heart disease Brother   . Heart disease Sister     Social History Social History   Tobacco Use  . Smoking status: Never Smoker  . Smokeless tobacco: Never Used  Substance Use Topics  . Alcohol use: No    Alcohol/week: 0.0 oz  . Drug use: Yes    Types: Marijuana    Comment: FORMER>>smoked weed from age 37 to 38>> quit at age 4      Allergies   Omnipaque [iohexol]   Review of Systems Review of Systems   Physical Exam Updated Vital Signs BP (!) 197/71 (BP Location: Left Arm)   Pulse 80   Temp 98.9 F (37.2 C) (Oral)   Resp 18   Ht 5\' 1"  (1.549 m)   Wt (!) 148.3 kg (327 lb)   SpO2 98%   BMI 61.79 kg/m   Physical Exam  Constitutional: She is oriented to person, place, and time. She appears well-developed and well-nourished. No distress.  HENT:  Head: Normocephalic and atraumatic.  Eyes: EOM are normal.  Neck: Normal range of motion.    Cardiovascular: Normal rate, regular rhythm and normal heart sounds.  Pulmonary/Chest:  Mild tachypnea.  Rales bilaterally  Abdominal: Soft. She exhibits no distension. There is no tenderness.  Musculoskeletal: Normal range of motion. She exhibits edema.  Warmth and swelling of right wrist with painful range of motion consistent with gout.  Normal right radial pulse.  Compartments of right upper extremity  are soft.  Normal grip strength right hand  Neurological: She is alert and oriented to person, place, and time.  Skin: Skin is warm and dry.  Psychiatric: She has a normal mood and affect. Judgment normal.  Nursing note and vitals reviewed.    ED Treatments / Results  Labs (all labs ordered are listed, but only abnormal results are displayed) Labs Reviewed  BASIC METABOLIC PANEL - Abnormal; Notable for the following components:      Result Value   Glucose, Bld 208 (*)    BUN 45 (*)    Creatinine, Ser 3.06 (*)    GFR calc non Af Amer 16 (*)    GFR calc Af Amer 18 (*)    All other components within normal limits  CBC - Abnormal; Notable for the following components:   WBC 11.7 (*)    Hemoglobin 10.6 (*)    MCH 25.3 (*)    MCHC 28.3 (*)    RDW 15.6 (*)    All other components within normal limits  BRAIN NATRIURETIC PEPTIDE  I-STAT TROPONIN, ED  I-STAT BETA HCG BLOOD, ED (MC, WL, AP ONLY)  I-STAT TROPONIN, ED    EKG EKG Interpretation  Date/Time:  Saturday May 12 2018 21:59:02 EDT Ventricular Rate:  85 PR Interval:  146 QRS Duration: 100 QT Interval:  342 QTC Calculation: 406 R Axis:   -68 Text Interpretation:  Normal sinus rhythm Pulmonary disease pattern Incomplete right bundle branch block Left anterior fascicular block Left ventricular hypertrophy with repolarization abnormality Cannot rule out Septal infarct , age undetermined Abnormal ECG No significant change was found Confirmed by Jola Schmidt [phone]) on 05/13/2018 2:57:29 AM   Radiology Dg Chest 2  View  Result Date: 05/12/2018 CLINICAL DATA:  Short of breath EXAM: CHEST - 2 VIEW COMPARISON:  11/01/2016 FINDINGS: Mild cardiomegaly with vascular congestion and mild interstitial edema. No pleural effusion. No focal consolidation. No pneumothorax. IMPRESSION: Cardiomegaly with vascular congestion and mild interstitial pulmonary edema. Electronically Signed   By: Donavan Foil M.D.   On: 05/12/2018 22:38    Procedures Procedures (including critical care time)  Medications Ordered in ED Medications  furosemide (LASIX) injection 80 mg (has no administration in time range)  hydrALAZINE (APRESOLINE) injection 10 mg (has no administration in time range)     Initial Impression / Assessment and Plan / ED Course  I have reviewed the triage vital signs and the nursing notes.  Pertinent labs & imaging results that were available during my care of the patient were reviewed by me and considered in my medical decision making (see chart for details).    Suspect CHF exacerbation with extertional SOB and orthopnea and edema on cxr. IV lasix now. Hydralazine for elevated BP. Will need venous duplex of her RUE to eval for DVT. On 3 liters [place] at home.   Admit to hospital    Final Clinical Impressions(s) / ED Diagnoses   Final diagnoses:  Acute on chronic congestive heart failure, unspecified heart failure type [facility])    ED Discharge Orders    None       Jola Schmidt, MD 05/13/18 Belva Crome    Jola Schmidt, MD 05/27/18 [phone]

## 2018-05-13 NOTE — ED Notes (Signed)
Pt CBG, 187. RN was notified.

## 2018-05-13 NOTE — Consult Note (Signed)
Reason for Consult: Shortness of breath, chest pain Referring Physician: Jani Gravel, MD  Catherine Williams is an 57 y.o. female.  HPI:   57 year old American female with hypertension, type 2 diabetes mellitus, CK stage IV, morbid obesity BMI 62, no obstructive CAD on cath in2010, peripheral artery disease s/p left PTA atherectomy and PTA 2012 by Dr. Trula Slade,. Patient was last seen by Dr. Einar Gip in the office in 2017.  Patient has been experiencing shortness of breath with minimal exertion as well as retrosternal dull chest pain with radiation under her left arm for last week or so. Symptoms are intermittent, worse with exertion, and relieved with rest. Patient sees to Selina Cooley, FNP for primary care, and Dr. Myna Hidalgo for her chronic kidney disease. Recently, her hydralazine was decreased to 25 mg once daily, and Lasix decreased to 80 mg in the morning, and 40 mg in the afternoon. She is also on metoprolol tartrate 25 mg twice daily. She says that her SBP at home usually runs 150-160 mmHg. her baseline creatinine runs around 2.6. She has been hopeful of avoiding dialysis, and would not like to go on dialysis at this time.   Past Medical History:  Diagnosis Date  . Anemia   . Arthritis Dec. 2014   Gout  . Asthma   . CHF (congestive heart failure) ([place])   . COPD (chronic obstructive pulmonary disease) ([place])   . Diabetes mellitus   . Heart failure   . Hyperlipidemia   . Hypertension   . Kidney disease   . Morbid obesity ([place])   . Peripheral vascular disease ([place])   . Pinched nerve   . Sleep apnea     Past Surgical History:  Procedure Laterality Date  . Atherectomy and angioplasty  10/18/2011   left posterior tibial artery  . HAND SURGERY     right  . OVARY SURGERY     Left  . TOE AMPUTATION  Sept. 25,2012   Left 4th and 5th toes    Family History  Problem Relation Age of Onset  . Lung cancer Mother   . Clotting disorder Mother   . Heart disease Mother   . Cancer Mother         Lung  . Deep vein thrombosis Mother   . Diabetes Mother   . Hypertension Mother   . Varicose Veins Mother   . Cancer Father   . Clotting disorder Sister   . Heart attack Sister   . Diabetes Sister   . Clotting disorder Brother   . Deep vein thrombosis Brother   . Diabetes Brother   . Heart disease Brother   . Heart disease Sister     Social History:  reports that she has never smoked. She has never used smokeless tobacco. She reports that she has current or past drug history. Drug: Marijuana. She reports that she does not drink alcohol.  Allergies:  Allergies  Allergen Reactions  . Omnipaque [Iohexol] Nausea And Vomiting    Contrast Dye- Effects Kidney's    Medications: I have reviewed the patient's current medications.  Results for orders placed or performed during the hospital encounter of 05/12/18 (from the past 48 hour(s))  Basic metabolic panel     Status: Abnormal   Collection Time: 05/12/18 10:07 PM  Result Value Ref Range   Sodium 139 135 - 145 mmol/L   Potassium 4.8 3.5 - 5.1 mmol/L   Chloride 104 101 - 111 mmol/L   CO2 27 22 - 32 mmol/L  Glucose, Bld 208 (H) 65 - 99 mg/dL   BUN 45 (H) 6 - 20 mg/dL   Creatinine, Ser 3.06 (H) 0.44 - 1.00 mg/dL   Calcium 9.4 8.9 - 10.3 mg/dL   GFR calc non Af Amer 16 (L) >60 mL/min   GFR calc Af Amer 18 (L) >60 mL/min    Comment: (NOTE) The eGFR has been calculated using the CKD EPI equation. This calculation has not been validated in all clinical situations. eGFR's persistently <60 mL/min signify possible Chronic Kidney Disease.    Anion gap 8 5 - 15    Comment: Performed at [facility] [address] [place], [place] [address]  CBC     Status: Abnormal   Collection Time: 05/12/18 10:07 PM  Result Value Ref Range   WBC 11.7 (H) 4.0 - 10.5 K/uL   RBC 4.19 3.87 - 5.11 MIL/uL   Hemoglobin 10.6 (L) 12.0 - 15.0 g/dL   HCT 37.5 36.0 - 46.0 %   MCV 89.5 78.0 - 100.0 fL   MCH 25.3 (L) 26.0 - 34.0 pg   MCHC 28.3  (L) 30.0 - 36.0 g/dL   RDW 15.6 (H) 11.5 - 15.5 %   Platelets 229 150 - 400 K/uL    Comment: Performed at [facility] [address], [place], [place] [address]  I-stat troponin, ED     Status: None   Collection Time: 05/12/18 10:15 PM  Result Value Ref Range   Troponin i, poc 0.02 0.00 - 0.08 ng/mL   Comment 3            Comment: Due to the release kinetics of cTnI, a negative result within the first hours of the onset of symptoms does not rule out myocardial infarction with certainty. If myocardial infarction is still suspected, repeat the test at appropriate intervals.   I-Stat beta hCG blood, ED     Status: None   Collection Time: 05/12/18 10:15 PM  Result Value Ref Range   I-stat hCG, quantitative <5.0 <5 mIU/mL   Comment 3            Comment:   GEST. AGE      CONC.  (mIU/mL)   <=1 WEEK        5 - 50     2 WEEKS       50 - 500     3 WEEKS       100 - 10,000     4 WEEKS     1,000 - 30,000        FEMALE AND NON-PREGNANT FEMALE:     LESS THAN 5 mIU/mL   Brain natriuretic peptide     Status: None   Collection Time: 05/13/18  3:33 AM  Result Value Ref Range   B Natriuretic Peptide 78.9 0.0 - 100.0 pg/mL    Comment: Performed at [facility], [place] [address], [place],  [address]  I-stat troponin, ED     Status: None   Collection Time: 05/13/18  3:39 AM  Result Value Ref Range   Troponin i, poc 0.00 0.00 - 0.08 ng/mL   Comment 3            Comment: Due to the release kinetics of cTnI, a negative result within the first hours of the onset of symptoms does not rule out myocardial infarction with certainty. If myocardial infarction is still suspected, repeat the test at appropriate intervals.   CBG monitoring, ED  Status: Abnormal   Collection Time: 05/13/18  7:40 AM  Result Value Ref Range   Glucose-Capillary 187 (H) 65 - 99 mg/dL    Dg Chest 2 View  Result Date: 05/12/2018 CLINICAL DATA:  Short of breath EXAM: CHEST - 2 VIEW COMPARISON:   11/01/2016 FINDINGS: Mild cardiomegaly with vascular congestion and mild interstitial edema. No pleural effusion. No focal consolidation. No pneumothorax. IMPRESSION: Cardiomegaly with vascular congestion and mild interstitial pulmonary edema. Electronically Signed   By: Donavan Foil M.D.   On: 05/12/2018 22:38    EKG 05/12/2018: Sinus rhythm 85 bpm. RBB. LAFB. Lateral T wave inversions. Cannot exclude ischemia.   Echocardiogram 2017: Study Conclusions  - Left ventricle: The cavity size was normal. - Impressions: Poor acoustic windows limit study, even with the use   of Definity.   Overall LVEF is normal ? infeior / inferolateral hypokinesis.   RV is not seen well enough to evaluate function.   Valvular function could not be studied  Impressions:  - Poor acoustic windows limit study, even with the use of Definity.   Overall LVEF is normal ? infeior / inferolateral hypokinesis.   RV is not seen well enough to evaluate function.   Valvular function could not be studied  Coronary angiogram 2010: CONCLUSIONS:  1. Minor coronary artery regularities.  2. Normal left ventricular systolic function.  She needs aggressive      therapy for her diabetes as well as her blood pressure.  She also      needs an aggressive weight loss program.    Review of Systems  Constitutional: Negative for fever.  HENT: Negative.   Eyes: Negative.   Respiratory: Positive for shortness of breath. Negative for hemoptysis and sputum production.   Cardiovascular: Positive for chest pain, orthopnea and leg swelling. Negative for PND.  Gastrointestinal: Negative for abdominal pain, blood in stool, nausea and vomiting.  Genitourinary: Negative.   Musculoskeletal: Positive for back pain.  Skin: Negative.   Neurological: Negative for dizziness and loss of consciousness.  Endo/Heme/Allergies: Does not bruise/bleed easily.  Psychiatric/Behavioral: The patient is nervous/anxious.   All other systems  reviewed and are negative.  Blood pressure (!) 144/61, pulse 80, temperature 98.9 F (37.2 C), temperature source Oral, resp. rate (!) 23, height _0  (1.549 m), weight (!) 148.3 kg (327 lb), SpO2 97 %. Physical Exam  Nursing note and vitals reviewed. Constitutional: She is oriented to person, place, and time. She appears well-developed.  Morbidly obese BMI 62  HENT:  Head: Normocephalic and atraumatic.  Eyes: Pupils are equal, round, and reactive to light. Conjunctivae are normal.  Neck: Normal range of motion. Neck supple.  Difficult to appreciate JVD due to body habitus and large neck  Cardiovascular: Normal rate and regular rhythm.  Murmur (II/VI holosystolic murmur RUSB) heard. Unable to appreciate femoral pulses due to body habitus. Unable to appreciate distal pulses, but warm extremities without signs of critical limb ischemia.  Respiratory: Effort normal. She has no wheezes. She has no rales.  Diminished breath sounds at bilateral bases  GI: Soft. Bowel sounds are normal. There is no tenderness.  Large pannus present  Musculoskeletal: Normal range of motion. She exhibits edema (1+ bilateral, pitting).  Lymphadenopathy:    She has no cervical adenopathy.  Neurological: She is alert and oriented to person, place, and time.  Skin: Skin is warm and dry.  Psychiatric: She has a normal mood and affect.   Assessment:  57 year old African-American female   Chest pain and  shortness of breath: Likely multifactorial due to uncontrolled hypertension, advancing CKD, possible heart failure with preserved ejection fraction. While BNP is only 79, this could be falsely low due to her morbid obesity. Given her risk factors, stable angina is possible differential due to undiagnosed coronary artery disease.  Uncontrolled hypertension. Serial EKG and troponin ruled out ACS. Type 2 diabetes mellitus Advancing CKD nearing stage V Peripheral artery disease: Stable H/o  asthma OSA  Recommendations:  Recommended switching metoprolol titrate 25 mg twice daily to carvedilol 6.25 mg twice daily. Uptitrate as tolerated. Switch hydralazine 25 mg once daily to BiDil 20-37.5 mg tid for better blood pressure control, as well as antianginal therapy. With her advancing CKD, with no imminent plans for dialysis and lack of dialysis access, any invasive workup for coronary artery disease would most likely complicate her renal function with very high risk of requiring emergent dialysis. Given this limitation, I do not thing noninvasive ischemic testing could be beneficial at this time. I recommend management of her symptoms with conservative medical means at this time. Recommend echocardiogram. Consider nephrology consult given worsening CKD.  As per patient's request, I spoke with patient's daughter Bobette Mo and updated her regarding patient's hospital admission.    Manish J Patwardhan 05/13/2018, 9:17 AM   [place], MD [facility] Pager: [phone] Office: [phone] If no answer Cell [phone]

## 2018-05-13 NOTE — Progress Notes (Signed)
Pt received to room 3E3- family at bedside, vss, st on telemetry,pt c/o pain in rightarm with splint in place, pain med givenper prn order and repostioned with arm onpillow. Educated on orders and test pending

## 2018-05-14 ENCOUNTER — Inpatient Hospital Stay (HOSPITAL_COMMUNITY): Payer: Medicare Other

## 2018-05-14 DIAGNOSIS — I5033 Acute on chronic diastolic (congestive) heart failure: Secondary | ICD-10-CM

## 2018-05-14 DIAGNOSIS — M7989 Other specified soft tissue disorders: Secondary | ICD-10-CM

## 2018-05-14 DIAGNOSIS — I5030 Unspecified diastolic (congestive) heart failure: Secondary | ICD-10-CM

## 2018-05-14 LAB — ECHOCARDIOGRAM COMPLETE
HEIGHTINCHES: 61 in
WEIGHTICAEL: 5269.88 [oz_av]

## 2018-05-14 LAB — CBC
HEMATOCRIT: 37.5 % (ref 36.0–46.0)
Hemoglobin: 10.2 g/dL — ABNORMAL LOW (ref 12.0–15.0)
MCH: 25.1 pg — ABNORMAL LOW (ref 26.0–34.0)
MCHC: 27.2 g/dL — AB (ref 30.0–36.0)
MCV: 92.4 fL (ref 78.0–100.0)
Platelets: 224 10*3/uL (ref 150–400)
RBC: 4.06 MIL/uL (ref 3.87–5.11)
RDW: 15.8 % — AB (ref 11.5–15.5)
WBC: 12.8 10*3/uL — ABNORMAL HIGH (ref 4.0–10.5)

## 2018-05-14 LAB — COMPREHENSIVE METABOLIC PANEL
ALBUMIN: 2.9 g/dL — AB (ref 3.5–5.0)
ALK PHOS: 72 U/L (ref 38–126)
ALT: 10 U/L — ABNORMAL LOW (ref 14–54)
ANION GAP: 10 (ref 5–15)
AST: 12 U/L — AB (ref 15–41)
BILIRUBIN TOTAL: 0.8 mg/dL (ref 0.3–1.2)
BUN: 48 mg/dL — AB (ref 6–20)
CO2: 28 mmol/L (ref 22–32)
Calcium: 9.6 mg/dL (ref 8.9–10.3)
Chloride: 103 mmol/L (ref 101–111)
Creatinine, Ser: 3.38 mg/dL — ABNORMAL HIGH (ref 0.44–1.00)
GFR calc Af Amer: 16 mL/min — ABNORMAL LOW (ref >60)
GFR calc non Af Amer: 14 mL/min — ABNORMAL LOW (ref >60)
GLUCOSE: 172 mg/dL — AB (ref 65–99)
POTASSIUM: 5.1 mmol/L (ref 3.5–5.1)
Sodium: 141 mmol/L (ref 135–145)
TOTAL PROTEIN: 7.3 g/dL (ref 6.5–8.1)

## 2018-05-14 LAB — GLUCOSE, CAPILLARY
GLUCOSE-CAPILLARY: 193 mg/dL — AB (ref 65–99)
GLUCOSE-CAPILLARY: 218 mg/dL — AB (ref 65–99)
GLUCOSE-CAPILLARY: 177 mg/dL — AB (ref 65–99)
Glucose-Capillary: 153 mg/dL — ABNORMAL HIGH (ref 65–99)

## 2018-05-14 LAB — BLOOD GAS, ARTERIAL
Acid-Base Excess: 0.7 mmol/L (ref 0.0–2.0)
Bicarbonate: 28.3 mmol/L — ABNORMAL HIGH (ref 20.0–28.0)
DRAWN BY: 51185
O2 Content: 2 L/min
O2 Saturation: 96.3 %
PCO2 ART: 78 mmHg — AB (ref 32.0–48.0)
PH ART: 7.185 — AB (ref 7.350–7.450)
Patient temperature: 98.6
pO2, Arterial: 85.9 mmHg (ref 83.0–108.0)

## 2018-05-14 LAB — LIPID PANEL
CHOL/HDL RATIO: 4 ratio
Cholesterol: 127 mg/dL (ref 0–200)
HDL: 32 mg/dL — ABNORMAL LOW (ref >40)
LDL Cholesterol: 73 mg/dL (ref 0–99)
Triglycerides: 110 mg/dL (ref <150)
VLDL: 22 mg/dL (ref 0–40)

## 2018-05-14 MED ORDER — PERFLUTREN LIPID MICROSPHERE
1.0000 mL | INTRAVENOUS | Status: AC | PRN
  Administered 2018-05-14: 2 mL via INTRAVENOUS
  Filled 2018-05-14: qty 10

## 2018-05-14 MED ORDER — INSULIN ASPART 100 UNIT/ML ~~LOC~~ SOLN
10.0000 [IU] | Freq: Three times a day (TID) | SUBCUTANEOUS | Status: DC
  Administered 2018-05-15 – 2018-05-18 (×10): 10 [IU] via SUBCUTANEOUS

## 2018-05-14 MED ORDER — PREDNISONE 50 MG PO TABS
50.0000 mg | ORAL_TABLET | Freq: Every day | ORAL | Status: DC
  Administered 2018-05-14 – 2018-05-15 (×2): 50 mg via ORAL
  Filled 2018-05-14 (×2): qty 1

## 2018-05-14 MED ORDER — PNEUMOCOCCAL VAC POLYVALENT 25 MCG/0.5ML IJ INJ: 0.5000 mL | INJECTION | INTRAMUSCULAR | Status: DC | PRN

## 2018-05-14 NOTE — Progress Notes (Signed)
Patient more alert, O2 sats WNL(93%). Respiratory rounding on patient, will continue to monitor.

## 2018-05-14 NOTE — Progress Notes (Signed)
Patient being dizzy, RN paged MD for updates on dizziness, ordered stat ABG's, RT give RN ABG's results, RN paged MD with results, MD gave RN verbal order to take pt off O2, put pt on continuous pulse Ox monitor, keep pt saturation at 88-91%, and put patient on cpap.  RT called by RN, patint is currently on cpap.

## 2018-05-14 NOTE — Progress Notes (Signed)
Patient Demographics:    Catherine Williams, is a 57 y.o. female, DOB - Dec 18, 1961, EBX:[id number]  Admit date - 05/12/2018   Admitting Physician Catherine Bulls, MD  Outpatient Primary MD for the patient is Catherine Drafts, FNP  LOS - 1   Chief Complaint  Patient presents with  . Shortness of Breath        Subjective:    Catherine Williams today has no fevers, no emesis,  No chest pain, shortness of breath is better, daughter and grandkids at bedside, questions answered,  Assessment  & Plan :    Principal Problem:   Acute on chronic diastolic CHF (congestive heart failure) (HCC) Active Problems:   OSA (obstructive sleep apnea)   CKD (chronic kidney disease), stage IV (HCC)   Insulin-requiring or dependent type II diabetes mellitus ([place])   Hypertension   Chronic respiratory failure with hypoxia (HCC)   Hypertensive urgency  Brief Summary  57 y.o. female with medical history significant for BMI 62, OSA, COPD, chronic diastolic CHF, chronic kidney disease stage IV, and insulin-dependent diabetes mellitus admitted on 05/13/2018 with acute on chronic diastolic dysfunction CHF exacerbation   Plan:- 1)HFpEF-admitted with acute on chronic diastolic CHF exacerbation in the setting of hypertensive urgency, shortness of breath is better with diuresis, last known EF 55 to 60%, continue IV Lasix 80 mg every 12 hours,  2)HTN-admitted with hypertensive urgency as above #1, BP controlled better now, continue Coreg 6.25 mg twice daily, continue BiDil 1 tablet 3 times daily  3)Morbid Obesity/OSA--- continue CPAP nightly,   4) CKD 4--renal function appears to be at baseline, monitor renal function and electrolytes closely with IV diuresis, avoid nephrotoxins  5)DM-poor control, last A1c 8, continue Lantus 45 units nightly, NovoLog 10 units  3 times daily,, Use Novolog/Humalog Sliding scale insulin with  Accu-Cheks/Fingersticks as ordered, anticipate worsening hyperglycemia due to steroids   6)Rt wrist Swelling/Pain-suspect gout, venous Dopplers negative for DVT, empiric steroids watch glucose closely  7)Acute on Chronic Hypoxic Respiratory Ffailure-chronic hypoxia secondary to heart failure, at baseline patient uses 2 L of oxygen continuously,, worsening hypoxia due to CHF exacerbation  Code Status : full code  Disposition Plan  : Home  DVT Prophylaxis  :   Heparin  Lab Results  Component Value Date   PLT 224 05/14/2018    Inpatient Medications  Scheduled Meds: . aspirin EC  81 mg Oral Daily  . carvedilol  6.25 mg Oral BID WC  . furosemide  80 mg Intravenous BID  . heparin  5,000 Units Subcutaneous Q8H  . insulin aspart  0-15 Units Subcutaneous TID WC  . insulin aspart  0-5 Units Subcutaneous QHS  . [START ON 05/15/2018] insulin aspart  10 Units Subcutaneous TID WC  . insulin glargine  45 Units Subcutaneous QHS  . isosorbide-hydrALAZINE  1 tablet Oral TID  . pantoprazole  40 mg Oral Daily  . predniSONE  50 mg Oral Q breakfast  . rosuvastatin  40 mg Oral QPM  . sodium chloride flush  3 mL Intravenous Q12H   Continuous Infusions: . sodium chloride     PRN Meds:.sodium chloride, acetaminophen, albuterol, cyclobenzaprine, hydrALAZINE, HYDROcodone-acetaminophen, ondansetron (ZOFRAN) IV, pneumococcal 23 valent vaccine, sodium chloride flush   Anti-infectives (From admission, onward)  None       Objective:   Vitals:   05/14/18 0019 05/14/18 0419 05/14/18 0955 05/14/18 1220  BP: 139/67 (!) 120/48 (!) 121/57 (!) 97/52  Pulse: 93 97 90 81  Resp: 20 19 20 18   Temp: 98.9 F (37.2 C) 99.2 F (37.3 C) 98.8 F (37.1 C) 98 F (36.7 C)  TempSrc: Oral Oral Oral Oral  SpO2: 98% 98% 95% 98%  Weight:  (!) 149.4 kg (329 lb 5.9 oz)    Height:        Wt Readings from Last 3 Encounters:  05/14/18 (!) 149.4 kg (329 lb 5.9 oz)  02/08/18 (!) 154.9 kg (341 lb 9.6 oz)  09/21/17  (!) 144.2 kg (318 lb)     Intake/Output Summary (Last 24 hours) at 05/14/2018 1720 Last data filed at 05/14/2018 1300 Gross per 24 hour  Intake 603 ml  Output 801 ml  Net -198 ml    Physical Exam Gen:- Awake Alert, morbidly obese HEENT:- [place].AT, No sclera icterus Nose- [place] 3 L/min Neck-Supple Neck,No JVD,.  Lungs-diminished in bases with bibasilar rales  CV- S1, S2 normal Abd-  +ve B.Sounds, Abd Soft, No tenderness, increased truncal adiposity Extremity/Skin:-Right upper extremity swelling and discomfort, right upper extremity splint in place, bilateral lower extremity trace pitting edema  psych-affect is appropriate, oriented x3 Neuro-no new focal deficits, no tremors   Data Review:   Micro Results No results found for this or any previous visit (from the past 240 hour(s)).  Radiology Reports Dg Chest 2 View  Result Date: 05/12/2018 CLINICAL DATA:  Short of breath EXAM: CHEST - 2 VIEW COMPARISON:  11/01/2016 FINDINGS: Mild cardiomegaly with vascular congestion and mild interstitial edema. No pleural effusion. No focal consolidation. No pneumothorax. IMPRESSION: Cardiomegaly with vascular congestion and mild interstitial pulmonary edema. Electronically Signed   By: Donavan Foil M.D.   On: 05/12/2018 22:38     CBC Recent Labs  Lab 05/12/18 2207 05/14/18 0334  WBC 11.7* 12.8*  HGB 10.6* 10.2*  HCT 37.5 37.5  PLT 229 224  MCV 89.5 92.4  MCH 25.3* 25.1*  MCHC 28.3* 27.2*  RDW 15.6* 15.8*    Chemistries  Recent Labs  Lab 05/12/18 2207 05/14/18 0334  NA 139 141  K 4.8 5.1  CL 104 103  CO2 27 28  GLUCOSE 208* 172*  BUN 45* 48*  CREATININE 3.06* 3.38*  CALCIUM 9.4 9.6  AST  --  12*  ALT  --  10*  ALKPHOS  --  72  BILITOT  --  0.8   ------------------------------------------------------------------------------------------------------------------ Recent Labs    05/14/18 0334  CHOL 127  HDL 32*  LDLCALC 73  TRIG 110  CHOLHDL 4.0    Lab Results    Component Value Date   HGBA1C 8.0 (H) 10/23/2016   ------------------------------------------------------------------------------------------------------------------ No results for input(s): TSH, T4TOTAL, T3FREE, THYROIDAB in the last 72 hours.  Invalid input(s): FREET3 ------------------------------------------------------------------------------------------------------------------ No results for input(s): VITAMINB12, FOLATE, FERRITIN, TIBC, IRON, RETICCTPCT in the last 72 hours.  Coagulation profile No results for input(s): INR, PROTIME in the last 168 hours.  No results for input(s): DDIMER in the last 72 hours.  Cardiac Enzymes Recent Labs  Lab 05/13/18 0806 05/13/18 1406  TROPONINI <0.03 0.03*   ------------------------------------------------------------------------------------------------------------------    Component Value Date/Time   BNP 78.9 05/13/2018 0333   Rutilio Yellowhair M.D on 05/14/2018 at 5:20 PM  Between 7am to 7pm - Pager - [phone]  After 7pm go to [email or web address] - password [email or web address]  [facility] -  Office  [phone]  Voice Recognition Viviann Spare dictation system was used to create this note, attempts have been made to correct errors. Please contact the author with questions and/or clarifications.

## 2018-05-14 NOTE — Progress Notes (Signed)
  Echocardiogram 2D Echocardiogram has been performed.  Catherine Williams 05/14/2018, 2:40 PM

## 2018-05-14 NOTE — Progress Notes (Signed)
Placed patient on CPAP via FFM, auto titrate setting with 2 lpm O2 bleed in. Tolerating well at this time.

## 2018-05-14 NOTE — Progress Notes (Signed)
Patient wants to remove CPAPmask and only wear oxygen tonight.

## 2018-05-14 NOTE — Plan of Care (Signed)
  Problem: Activity: Goal: Capacity to carry out activities will improve Outcome: Progressing   Problem: Cardiac: Goal: Ability to achieve and maintain adequate cardiopulmonary perfusion will improve Outcome: Progressing   Problem: Education: Goal: Ability to demonstrate management of disease process will improve Outcome: Progressing   Problem: Elimination: Goal: Will not experience complications related to urinary retention Outcome: Progressing   Problem: Clinical Measurements: Goal: Cardiovascular complication will be avoided Outcome: Progressing   Problem: Clinical Measurements: Goal: Respiratory complications will improve Outcome: Progressing

## 2018-05-14 NOTE — Progress Notes (Signed)
VASCULAR LAB PRELIMINARY  PRELIMINARY  PRELIMINARY  PRELIMINARY  Right upper extremity venous duplex completed.    Preliminary report:  There is no DVT or SVT noted in the right upper extremity.  Olivya Sobol, RVT 05/14/2018, 10:36 AM

## 2018-05-14 NOTE — Progress Notes (Signed)
Visited with patient regarding AD information.   While I was explaining the paperwork, the patient fell asleep with snoring.  I left the paperwork with her, will follow up.    05/14/18 1305  Clinical Encounter Type  Visited With Patient  Visit Type Initial;Spiritual support  Spiritual Encounters  Spiritual Needs Literature (Advanced Directive)

## 2018-05-14 NOTE — Plan of Care (Signed)
  Problem: Spiritual Needs Goal: Ability to function at adequate level Outcome: Progressing   Problem: Activity: Goal: Capacity to carry out activities will improve Outcome: Progressing

## 2018-05-15 LAB — GLUCOSE, CAPILLARY
GLUCOSE-CAPILLARY: 197 mg/dL — AB (ref 65–99)
GLUCOSE-CAPILLARY: 171 mg/dL — AB (ref 65–99)
Glucose-Capillary: 164 mg/dL — ABNORMAL HIGH (ref 65–99)
Glucose-Capillary: 146 mg/dL — ABNORMAL HIGH (ref 65–99)

## 2018-05-15 MED ORDER — PREDNISONE 20 MG PO TABS
20.0000 mg | ORAL_TABLET | Freq: Every day | ORAL | Status: AC
  Administered 2018-05-16: 20 mg via ORAL
  Filled 2018-05-15: qty 1

## 2018-05-15 MED ORDER — FUROSEMIDE 10 MG/ML IJ SOLN
60.0000 mg | Freq: Two times a day (BID) | INTRAMUSCULAR | Status: DC
  Administered 2018-05-15 – 2018-05-16 (×3): 60 mg via INTRAVENOUS
  Filled 2018-05-15 (×3): qty 6

## 2018-05-15 NOTE — Progress Notes (Addendum)
Patient Demographics:    Catherine Williams, is a 57 y.o. female, DOB - 1961-03-14, SHF:[id number]  Admit date - 05/12/2018   Admitting Physician Vianne Bulls, MD  Outpatient Primary MD for the patient is Vonna Drafts, FNP  LOS - 2   Chief Complaint  Patient presents with  . Shortness of Breath        Subjective:    Catherine Williams today has no fevers, no emesis,  No chest pain, episodes of lethargy associated with high flow oxygen with ABG reflecting uncompensated respiratory acidosis and hypercapnia, feels much better this a.m. because she used CPAP overnight  Assessment  & Plan :    Principal Problem:   Acute on chronic diastolic CHF (congestive heart failure) (HCC) Active Problems:   OSA (obstructive sleep apnea)   CKD (chronic kidney disease), stage IV (HCC)   Insulin-requiring or dependent type II diabetes mellitus (HCC)   Hypertension   Chronic respiratory failure with hypoxia (HCC)   Hypertensive urgency  Brief Summary  57 y.o. female with medical history significant for BMI 62, OSA, COPD, chronic diastolic CHF, chronic kidney disease stage IV, and insulin-dependent diabetes mellitus admitted on 05/13/2018 with acute on chronic diastolic dysfunction CHF exacerbation. episodes of lethargy associated with high flow oxygen with ABG reflecting uncompensated respiratory acidosis and hypercapnia, feels much better this a.m. because she used CPAP overnight    Plan:- 1)HFpEF-admitted with acute on chronic diastolic CHF exacerbation in the setting of hypertensive urgency, shortness of breath is better with diuresis, last known EF 55 to 60%, change iv Lasix to 60 mg bid , monitor daily weights, fluid input and output, and electrolytes and renal function closely while diuresing her.  Her weight is almost 332 pounds  2)HTN-admitted with hypertensive urgency as above #1, BP controlled better now,  continue Coreg 6.25 mg twice daily, continue BiDil 1 tablet 3 times daily  3)Morbid Obesity/OSA--- continue CPAP nightly may also use CPAP during the day when she falls asleep, episodes of lethargy associated with high flow oxygen with ABG reflecting uncompensated respiratory acidosis and hypercapnia, feels much better this a.m. because she used CPAP overnight  4)CKD 4--creatinine currently around 3.3, which is not far from her baseline, continue to monitor closely with diuresis ,  monitor renal function and electrolytes closely with IV diuresis, avoid nephrotoxins  5)DM-poor control, last A1c 8, continue Lantus 45 units nightly, NovoLog 10 units  3 times daily,, Use Novolog/Humalog Sliding scale insulin with Accu-Cheks/Fingersticks as ordered, anticipate worsening hyperglycemia due to steroids   6)Rt Wrist Swelling/Pain-suspect gout, venous Dopplers negative for DVT, empiric steroids watch glucose closely while on prednisone  7)Acute on Chronic Hypoxic AND hypercapnic respiratory Ffailure-chronic hypoxia secondary to heart failure, at baseline patient uses 2 L of oxygen continuously, on admission worsening hypoxia due to CHF exacerbation, at this time patient is only requiring 0.5 L of oxygen, be very judicious with oxygen as patient is a CO2 retainer.  Episodes of lethargy associated with high flow oxygen with ABG reflecting uncompensated respiratory acidosis and hypercapnia, feels much better this a.m. because she used CPAP overnight   Code Status : full code  Disposition Plan  : Home  DVT Prophylaxis  :   Heparin  Lab Results  Component  Value Date   PLT 224 05/14/2018    Inpatient Medications  Scheduled Meds: . aspirin EC  81 mg Oral Daily  . carvedilol  6.25 mg Oral BID WC  . furosemide  60 mg Intravenous BID  . heparin  5,000 Units Subcutaneous Q8H  . insulin aspart  0-15 Units Subcutaneous TID WC  . insulin aspart  0-5 Units Subcutaneous QHS  . insulin aspart  10 Units  Subcutaneous TID WC  . insulin glargine  45 Units Subcutaneous QHS  . isosorbide-hydrALAZINE  1 tablet Oral TID  . pantoprazole  40 mg Oral Daily  . predniSONE  50 mg Oral Q breakfast  . rosuvastatin  40 mg Oral QPM  . sodium chloride flush  3 mL Intravenous Q12H   Continuous Infusions: . sodium chloride     PRN Meds:.sodium chloride, acetaminophen, albuterol, cyclobenzaprine, hydrALAZINE, HYDROcodone-acetaminophen, ondansetron (ZOFRAN) IV, pneumococcal 23 valent vaccine, sodium chloride flush   Anti-infectives (From admission, onward)   None       Objective:   Vitals:   05/14/18 2146 05/14/18 2356 05/15/18 0527 05/15/18 0754  BP:  (!) 101/38 (!) 106/47 (!) 110/54  Pulse:  65 79 78  Resp:  (!) 24 18 (!) 24  Temp:  99.3 F (37.4 C) 98.3 F (36.8 C) 98.9 F (37.2 C)  TempSrc:  Oral Oral Oral  SpO2: 98% 93% 97% 94%  Weight:   (!) 150.5 kg (331 lb 12.7 oz)   Height:        Wt Readings from Last 3 Encounters:  05/15/18 (!) 150.5 kg (331 lb 12.7 oz)  02/08/18 (!) 154.9 kg (341 lb 9.6 oz)  09/21/17 (!) 144.2 kg (318 lb)     Intake/Output Summary (Last 24 hours) at 05/15/2018 1411 Last data filed at 05/15/2018 4166 Gross per 24 hour  Intake 598 ml  Output 0 ml  Net 598 ml    Physical Exam Gen:- Awake Alert, morbidly obese HEENT:- [place].AT, No sclera icterus Nose- [place] 0.5 L/min Neck-Supple Neck,No JVD,.  Lungs-diminished in bases, no wheezing  CV- S1, S2 normal Abd-  +ve B.Sounds, Abd Soft, No tenderness, increased truncal adiposity Extremity/Skin:-Right upper extremity swelling and discomfort, right upper extremity splint in place, bilateral lower extremity trace pitting edema  psych-affect is appropriate, oriented x3 Neuro-no new focal deficits, no tremors   Data Review:   Micro Results No results found for this or any previous visit (from the past 240 hour(s)).  Radiology Reports Dg Chest 2 View  Result Date: 05/12/2018 CLINICAL DATA:  Short of breath EXAM:  CHEST - 2 VIEW COMPARISON:  11/01/2016 FINDINGS: Mild cardiomegaly with vascular congestion and mild interstitial edema. No pleural effusion. No focal consolidation. No pneumothorax. IMPRESSION: Cardiomegaly with vascular congestion and mild interstitial pulmonary edema. Electronically Signed   By: Donavan Foil M.D.   On: 05/12/2018 22:38     CBC Recent Labs  Lab 05/12/18 2207 05/14/18 0334  WBC 11.7* 12.8*  HGB 10.6* 10.2*  HCT 37.5 37.5  PLT 229 224  MCV 89.5 92.4  MCH 25.3* 25.1*  MCHC 28.3* 27.2*  RDW 15.6* 15.8*    Chemistries  Recent Labs  Lab 05/12/18 2207 05/14/18 0334  NA 139 141  K 4.8 5.1  CL 104 103  CO2 27 28  GLUCOSE 208* 172*  BUN 45* 48*  CREATININE 3.06* 3.38*  CALCIUM 9.4 9.6  AST  --  12*  ALT  --  10*  ALKPHOS  --  72  BILITOT  --  0.8   ------------------------------------------------------------------------------------------------------------------ Recent Labs    05/14/18 0334  CHOL 127  HDL 32*  LDLCALC 73  TRIG 110  CHOLHDL 4.0    Lab Results  Component Value Date   HGBA1C 8.0 (H) 10/23/2016   ------------------------------------------------------------------------------------------------------------------ No results for input(s): TSH, T4TOTAL, T3FREE, THYROIDAB in the last 72 hours.  Invalid input(s): FREET3 ------------------------------------------------------------------------------------------------------------------ No results for input(s): VITAMINB12, FOLATE, FERRITIN, TIBC, IRON, RETICCTPCT in the last 72 hours.  Coagulation profile No results for input(s): INR, PROTIME in the last 168 hours.  No results for input(s): DDIMER in the last 72 hours.  Cardiac Enzymes Recent Labs  Lab 05/13/18 0806 05/13/18 1406  TROPONINI <0.03 0.03*   ------------------------------------------------------------------------------------------------------------------    Component Value Date/Time   BNP 78.9 05/13/2018 0333   Dewan Emond M.D on 05/15/2018 at 2:11 PM  Between 7am to 7pm - Pager - [phone]  After 7pm go to [email or web address] - password [email or web address]  [facility] -  Office  [phone]  Voice Recognition Viviann Spare dictation system was used to create this note, attempts have been made to correct errors. Please contact the author with questions and/or clarifications.

## 2018-05-15 NOTE — Progress Notes (Signed)
Patient more responsive. CPAP still in place and daughter updated on condition.

## 2018-05-15 NOTE — Progress Notes (Signed)
Patient with 5 beats of vtach, RN assessed patient. VSS and NP notified. Will continue to monitor.

## 2018-05-15 NOTE — Progress Notes (Signed)
Patient had no output tonight, says she will use it later and is not comfortable getting up. Will try purewick.

## 2018-05-15 NOTE — Progress Notes (Signed)
Patient way more alert this morning, has been opening eyes when someone walks in the room (earlier had to yell name or lightly shake/tap), patient now back on 3L w/ CPAP (previously on 7L, then 5L).   Patient says she will get up to use bathroom soon, but RN placed purewick for now. If no output- will bladder scan.

## 2018-05-15 NOTE — Care Management Note (Addendum)
Case Management Note  Patient Details  Name: Catherine Williams MRN: [id number] Date of Birth: 1961-12-10  Subjective/Objective:  History significant for BMI 44 (weight almost 332 pounds), OSA, COPD, chronic diastolic CHF, chronic kidney disease stage IV, and insulin-dependent diabetes mellitus.  Admitted for Acute on Chronic Hypoxic AND hypercapnic respiratory failure-chronic hypoxia secondary to heart failure, at baseline patient uses 2 L of oxygen continuously.  PCP noted.          Action/Plan: Cardiology consult. In to speak with patient, but patient unavailable at this time.  Prior to admission patient lived at home with friend.  States her daughter lives across the way. At discharge patient plans to return to same living situation.     05/16/18 In to speak with patient, Patient states she has been diagnosed with CHF for around 5 years, Cardiologist is Dr. Einar Gip; has not seen him for 2 years.  Last saw her primary care provider 2 months ago.  Plans to call cardiologist today to discuss another referral to bariatric clinic.  Patient admits to transportation issues getting to medical appointments. NCM will put in SW consult regarding transportation to doctor.  Patient agreed to dietician consult this admission.  She states her daughters do cook for her and admits to eating fast food/high sodium foods at home.  Home DME: cane, walker (no wheels), nebulizer machine and oxygen (2L QHS, [facility]. Patient does have a scale and states she weighs daily. Patient requested DME: Rolling walker.  Referral for Rolling walker called to [place] discharge liaison with [facility].  NCM will continue to follow for discharge transition needs. Expected Discharge Date:    To be determined              Expected Discharge Plan:    To be determined  In-House Referral:  Clinical Social Work  Discharge planning Services  CM Consult  Status of Service:  In process, will continue to  follow  Kristen Cardinal, RN 05/15/2018, 3:26 PM

## 2018-05-16 ENCOUNTER — Inpatient Hospital Stay (HOSPITAL_COMMUNITY): Payer: Medicare Other

## 2018-05-16 LAB — BASIC METABOLIC PANEL
Anion gap: 12 (ref 5–15)
BUN: 79 mg/dL — AB (ref 6–20)
CO2: 26 mmol/L (ref 22–32)
CREATININE: 6.02 mg/dL — AB (ref 0.44–1.00)
Calcium: 9.4 mg/dL (ref 8.9–10.3)
Chloride: 98 mmol/L — ABNORMAL LOW (ref 101–111)
GFR calc Af Amer: 8 mL/min — ABNORMAL LOW (ref >60)
GFR, EST NON AFRICAN AMERICAN: 7 mL/min — AB (ref >60)
Glucose, Bld: 115 mg/dL — ABNORMAL HIGH (ref 65–99)
Potassium: 5.6 mmol/L — ABNORMAL HIGH (ref 3.5–5.1)
Sodium: 136 mmol/L (ref 135–145)

## 2018-05-16 LAB — CBC
HCT: 31.8 % — ABNORMAL LOW (ref 36.0–46.0)
Hemoglobin: 9.1 g/dL — ABNORMAL LOW (ref 12.0–15.0)
MCH: 25.1 pg — AB (ref 26.0–34.0)
MCHC: 28.6 g/dL — AB (ref 30.0–36.0)
MCV: 87.6 fL (ref 78.0–100.0)
PLATELETS: 251 10*3/uL (ref 150–400)
RBC: 3.63 MIL/uL — ABNORMAL LOW (ref 3.87–5.11)
RDW: 15.3 % (ref 11.5–15.5)
WBC: 10.5 10*3/uL (ref 4.0–10.5)

## 2018-05-16 LAB — GLUCOSE, CAPILLARY
GLUCOSE-CAPILLARY: 147 mg/dL — AB (ref 65–99)
Glucose-Capillary: 113 mg/dL — ABNORMAL HIGH (ref 65–99)
Glucose-Capillary: 154 mg/dL — ABNORMAL HIGH (ref 65–99)
Glucose-Capillary: 140 mg/dL — ABNORMAL HIGH (ref 65–99)

## 2018-05-16 MED ORDER — SODIUM POLYSTYRENE SULFONATE 15 GM/60ML PO SUSP
15.0000 g | Freq: Once | ORAL | Status: AC
  Administered 2018-05-16: 15 g via ORAL
  Filled 2018-05-16: qty 60

## 2018-05-16 MED ORDER — ROSUVASTATIN CALCIUM 10 MG PO TABS
10.0000 mg | ORAL_TABLET | Freq: Every evening | ORAL | Status: DC
  Administered 2018-05-16 – 2018-05-17 (×2): 10 mg via ORAL
  Filled 2018-05-16 (×4): qty 1

## 2018-05-16 NOTE — Progress Notes (Signed)
Discharge Planning:  Admitted for CHF, HTN urgency and acute on chronic hypoxic and hypercapnia, hx COPD. She gets tired walking long distances. Contacted [facility] for Rollator for home.  Jonnie Finner RN CCM Case Mgmt phone [phone]

## 2018-05-16 NOTE — Evaluation (Signed)
Physical Therapy Evaluation Patient Details Name: Catherine Williams MRN: [id number] DOB: 09-02-1961 Today's Date: 05/16/2018   History of Present Illness  Pt is a 57 y/o female admitted secondary to SOB and cough. Pt found to have acute on chronic heart failure. PMH including but not limited to CHF, DM, HTN, PVD, COPD.    Clinical Impression  Pt presented supine in bed with HOB elevated, awake and willing to participate in therapy session. Prior to admission, pt reported that she ambulated with use of SPC PRN and was independent with ADLs with increased time. Pt currently able to perform bed mobility with min guard, transfers with min guard and ambulate a short distance within room with min guard and use of RW. Pt would continue to benefit from skilled physical therapy services at this time while admitted and after d/c to address the below listed limitations in order to improve overall safety and independence with functional mobility.  Of note, pt on 2L of O2 throughout with SPO2 decreasing into low 80's with activity, with quick rebound to 99% with sitting rest break and pursed lip breathing.      Follow Up Recommendations Home health PT;Supervision/Assistance - 24 hour    Equipment Recommendations  Other (comment)(bari rollator)    Recommendations for Other Services       Precautions / Restrictions Precautions Precautions: Fall Precaution Comments: watch SPO2 Restrictions Weight Bearing Restrictions: No      Mobility  Bed Mobility Overal bed mobility: Needs Assistance Bed Mobility: Supine to Sit     Supine to sit: Min guard     General bed mobility comments: increased time and effort, min guard for safety  Transfers Overall transfer level: Needs assistance Equipment used: Rolling walker (2 wheeled) Transfers: Sit to/from Stand Sit to Stand: Min guard         General transfer comment: good technique, min guard for safety  Ambulation/Gait Ambulation/Gait  assistance: Min guard Ambulation Distance (Feet): 15 Feet Assistive device: Rolling walker (2 wheeled) Gait Pattern/deviations: Step-through pattern;Decreased step length - right;Decreased step length - left;Decreased stride length Gait velocity: decreased Gait velocity interpretation: <1.31 ft/sec, indicative of household ambulator General Gait Details: limited secondary to fatigue; pt steady with use of RW, no LOB or need for physical assistance, min guard for safety  Stairs            Wheelchair Mobility    Modified Rankin (Stroke Patients Only)       Balance Overall balance assessment: Needs assistance Sitting-balance support: Feet supported Sitting balance-Leahy Scale: Good     Standing balance support: During functional activity;Bilateral upper extremity supported Standing balance-Leahy Scale: Poor                               Pertinent Vitals/Pain Pain Assessment: No/denies pain    Home Living Family/patient expects to be discharged to:: Private residence Living Arrangements: Alone Available Help at Discharge: Family;Available PRN/intermittently Type of Home: Apartment Home Access: Stairs to enter Entrance Stairs-Rails: Right;Left Entrance Stairs-Number of Steps: flight Home Layout: One level Home Equipment: Cane - single point;Shower seat      Prior Function Level of Independence: Independent with assistive device(s)         Comments: ambulates with SPC PRN     Hand Dominance        Extremity/Trunk Assessment   Upper Extremity Assessment Upper Extremity Assessment: Overall WFL for tasks assessed    Lower Extremity Assessment  Lower Extremity Assessment: Overall WFL for tasks assessed       Communication   Communication: No difficulties  Cognition Arousal/Alertness: Awake/alert Behavior During Therapy: WFL for tasks assessed/performed Overall Cognitive Status: Within Functional Limits for tasks assessed                                         General Comments General comments (skin integrity, edema, etc.): Pt on 2L of O2 throughout with SPO2 decreasing into low 80's with activity with quick rebound to 99% with sitting rest break and pursed lip breathing    Exercises     Assessment/Plan    PT Assessment Patient needs continued PT services  PT Problem List Decreased strength;Decreased activity tolerance;Decreased balance;Decreased mobility;Decreased coordination;Decreased safety awareness;Decreased knowledge of precautions;Cardiopulmonary status limiting activity       PT Treatment Interventions DME instruction;Gait training;Stair training;Functional mobility training;Therapeutic activities;Therapeutic exercise;Balance training;Neuromuscular re-education;Patient/family education    PT Goals (Current goals can be found in the Care Plan section)  Acute Rehab PT Goals Patient Stated Goal: to get a first level apartment and return home PT Goal Formulation: With patient/family Time For Goal Achievement: 05/30/18 Potential to Achieve Goals: Good    Frequency Min 3X/week   Barriers to discharge        Co-evaluation               AM-PAC PT "6 Clicks" Daily Activity  Outcome Measure Difficulty turning over in bed (including adjusting bedclothes, sheets and blankets)?: None Difficulty moving from lying on back to sitting on the side of the bed? : None Difficulty sitting down on and standing up from a chair with arms (e.g., wheelchair, bedside commode, etc,.)?: Unable Help needed moving to and from a bed to chair (including a wheelchair)?: A Little Help needed walking in hospital room?: A Little Help needed climbing 3-5 steps with a railing? : A Little 6 Click Score: 18    End of Session Equipment Utilized During Treatment: Oxygen(2L) Activity Tolerance: Patient limited by fatigue Patient left: in bed;with call bell/phone within reach;with family/visitor present;Other  (comment)(sitting EOB to eat dinner) Nurse Communication: Mobility status PT Visit Diagnosis: Other abnormalities of gait and mobility (R26.89)    Time: 1725-1740 PT Time Calculation (min) (ACUTE ONLY): 15 min   Charges:   PT Evaluation $PT Eval Moderate Complexity: 1 Mod     PT G Codes:        Glidden, PT, DPT [place] 05/16/2018, 5:49 PM

## 2018-05-16 NOTE — Progress Notes (Signed)
Pt educated about safety and importance of bed alarm during the night however pt refuses to be on bed alarm. Will continue to round on patient.   Daltyn Degroat, RN    

## 2018-05-16 NOTE — Progress Notes (Signed)
PROGRESS NOTE    Catherine Williams  OJJ:[id number] DOB: [birth year] DOA: 05/12/2018 PCP: Vonna Drafts, FNP     Brief Narrative:  Catherine Williams is a 57 y.o.femalewith medical history significant formorbid obesity with BMI 62, OSA, COPD, chronic diastolic CHF, chronic kidney disease stage IV, and insulin-dependent diabetes mellitus who was admitted on 05/13/2018 with acute on chronic diastolic dysfunction CHF exacerbation.   New events last 24 hours / Subjective: No events overnight. States she feels her breathing is slightly improved but not to baseline. No chest pain.   Assessment & Plan:   Principal Problem:   Acute on chronic diastolic CHF (congestive heart failure) (HCC) Active Problems:   OSA (obstructive sleep apnea)   CKD (chronic kidney disease), stage IV (HCC)   Insulin-requiring or dependent type II diabetes mellitus (HCC)   Hypertension   Chronic respiratory failure with hypoxia (HCC)   Hypertensive urgency   Acute on chronic diastolic CHF  -EF 55 to 93% with grade 1 diastolic dysfunction  -Has been diuresing with IV lasix 60mg  BID. Will hold further doses today due to worsening renal function -Monitor strict I/Os, daily weight   Acute on chronic hypoxic and hypercapnic respiratory failure -Uses 2 L of oxygen at baseline  -Has had episodes of CO2 retention and episodes of lethargy -CPAP qhs   Hypertensive urgency  -Continue Coreg 6.25 mg twice daily -Continue BiDil 1 tablet 3 times daily  Morbid Obesity/OSA -Continue CPAP nightly. May also use CPAP during the day when she falls asleep. Episodes of lethargy associated with high flow oxygen with ABG reflecting uncompensated respiratory acidosis and hypercapnia  AKI on CKD 4 -Baseline Cr ~3  -Now 6.02  -Check renal US  -Hold further doses of lasix and monitor closely   Hyperkalemia -Will give kayexalate 1 dose   CAD -Continue aspirin, crestor   DM -Continue Lantus 45 units nightly, NovoLog 10  units 3 times daily, SSI -Blood sugar well controlled this morning    Rt wrist swelling and pain secondary to suspected gout  -Empiric prednisone   GERD -Continue PPI   DVT prophylaxis: subq hep  Code Status: Full Family Communication: No family at bedside Disposition Plan: Pending improvement in renal function, CHF    Consultants:   None  Procedures:   None   Antimicrobials:  Anti-infectives (From admission, onward)   None       Objective: Vitals:   05/15/18 2229 05/16/18 0529 05/16/18 0534 05/16/18 1141  BP:  (!) 158/63  (!) 149/79  Pulse: 79 78  76  Resp: 20 18  20   Temp:  98.7 F (37.1 C)  98.8 F (37.1 C)  TempSrc:  Oral  Oral  SpO2: 97% 93%  94%  Weight:   (!) 149.8 kg (330 lb 3.2 oz)   Height:        Intake/Output Summary (Last 24 hours) at 05/16/2018 1218 Last data filed at 05/16/2018 0900 Gross per 24 hour  Intake 480 ml  Output 250 ml  Net 230 ml   Filed Weights   05/14/18 0419 05/15/18 0527 05/16/18 0534  Weight: (!) 149.4 kg (329 lb 5.9 oz) (!) 150.5 kg (331 lb 12.7 oz) (!) 149.8 kg (330 lb 3.2 oz)    Examination:  General exam: Appears calm and comfortable, morbidly obese  Respiratory system: Clear to auscultation. Respiratory effort normal. Cardiovascular system: S1 & S2 heard, RRR. No JVD, murmurs, rubs, gallops or clicks. No pedal edema. Gastrointestinal system: Abdomen is nondistended, soft and  nontender. No organomegaly or masses felt. Normal bowel sounds heard. Central nervous system: Alert and oriented. No focal neurological deficits. Extremities: Symmetric 5 x 5 power. Skin: No rashes, lesions or ulcers Psychiatry: Judgement and insight appear normal. Mood & affect appropriate.   Data Reviewed: I have personally reviewed following labs and imaging studies  CBC: Recent Labs  Lab 05/12/18 2207 05/14/18 0334 05/16/18 0743  WBC 11.7* 12.8* 10.5  HGB 10.6* 10.2* 9.1*  HCT 37.5 37.5 31.8*  MCV 89.5 92.4 87.6  PLT 229 224 251     Basic Metabolic Panel: Recent Labs  Lab 05/12/18 2207 05/14/18 0334 05/16/18 0743  NA 139 141 136  K 4.8 5.1 5.6*  CL 104 103 98*  CO2 27 28 26   GLUCOSE 208* 172* 115*  BUN 45* 48* 79*  CREATININE 3.06* 3.38* 6.02*  CALCIUM 9.4 9.6 9.4   GFR: Estimated Creatinine Clearance: 14.1 mL/min (A) (by C-G formula based on SCr of 6.02 mg/dL (H)). Liver Function Tests: Recent Labs  Lab 05/14/18 0334  AST 12*  ALT 10*  ALKPHOS 72  BILITOT 0.8  PROT 7.3  ALBUMIN 2.9*   No results for input(s): LIPASE, AMYLASE in the last 168 hours. No results for input(s): AMMONIA in the last 168 hours. Coagulation Profile: No results for input(s): INR, PROTIME in the last 168 hours. Cardiac Enzymes: Recent Labs  Lab 05/13/18 0806 05/13/18 1406  TROPONINI <0.03 0.03*   BNP (last 3 results) No results for input(s): PROBNP in the last 8760 hours. HbA1C: No results for input(s): HGBA1C in the last 72 hours. CBG: Recent Labs  Lab 05/15/18 0751 05/15/18 1242 05/15/18 1658 05/15/18 2159 05/16/18 0837  GLUCAP 197* 164* 146* 171* 113*   Lipid Profile: Recent Labs    05/14/18 0334  CHOL 127  HDL 32*  LDLCALC 73  TRIG 110  CHOLHDL 4.0   Thyroid Function Tests: No results for input(s): TSH, T4TOTAL, FREET4, T3FREE, THYROIDAB in the last 72 hours. Anemia Panel: No results for input(s): VITAMINB12, FOLATE, FERRITIN, TIBC, IRON, RETICCTPCT in the last 72 hours. Sepsis Labs: No results for input(s): PROCALCITON, LATICACIDVEN in the last 168 hours.  No results found for this or any previous visit (from the past 240 hour(s)).     Radiology Studies: No results found.    Scheduled Meds: . aspirin EC  81 mg Oral Daily  . carvedilol  6.25 mg Oral BID WC  . heparin  5,000 Units Subcutaneous Q8H  . insulin aspart  0-15 Units Subcutaneous TID WC  . insulin aspart  0-5 Units Subcutaneous QHS  . insulin aspart  10 Units Subcutaneous TID WC  . insulin glargine  45 Units Subcutaneous  QHS  . isosorbide-hydrALAZINE  1 tablet Oral TID  . pantoprazole  40 mg Oral Daily  . rosuvastatin  40 mg Oral QPM  . sodium chloride flush  3 mL Intravenous Q12H  . sodium polystyrene  15 g Oral Once   Continuous Infusions: . sodium chloride       LOS: 3 days    Time spent: 40 minutes   Dessa Phi, DO [facility] [email or web address] Password [facility] 05/16/2018, 12:18 PM

## 2018-05-16 NOTE — Progress Notes (Signed)
Placed pt on cpap with pressure of 5 and 2 L bled in. Pt tolerating well. Will cont to monitor.

## 2018-05-17 LAB — BASIC METABOLIC PANEL
Anion gap: 11 (ref 5–15)
BUN: 87 mg/dL — ABNORMAL HIGH (ref 6–20)
CALCIUM: 8.9 mg/dL (ref 8.9–10.3)
CO2: 26 mmol/L (ref 22–32)
Chloride: 99 mmol/L — ABNORMAL LOW (ref 101–111)
Creatinine, Ser: 6.06 mg/dL — ABNORMAL HIGH (ref 0.44–1.00)
GFR, EST AFRICAN AMERICAN: 8 mL/min — AB (ref >60)
GFR, EST NON AFRICAN AMERICAN: 7 mL/min — AB (ref >60)
Glucose, Bld: 88 mg/dL (ref 65–99)
Potassium: 4.9 mmol/L (ref 3.5–5.1)
SODIUM: 136 mmol/L (ref 135–145)

## 2018-05-17 LAB — GLUCOSE, CAPILLARY
GLUCOSE-CAPILLARY: 109 mg/dL — AB (ref 65–99)
GLUCOSE-CAPILLARY: 109 mg/dL — AB (ref 65–99)
Glucose-Capillary: 79 mg/dL (ref 65–99)
Glucose-Capillary: 79 mg/dL (ref 65–99)

## 2018-05-17 NOTE — Evaluation (Signed)
Occupational Therapy Evaluation Patient Details Name: Catherine Williams MRN: [id number] DOB: January 26, 1961 Today's Date: 05/17/2018    History of Present Illness Pt is a 56 y/o female admitted secondary to SOB and cough. Pt found to have acute on chronic heart failure. PMH including but not limited to CHF, DM, HTN, PVD, COPD.   Clinical Impression   Pt reports family assisted with ADL PTA. Currently pt requires supervision for functional mobility and mod assist for LB ADL. Began AE and energy conservation education; pt would benefit from practice and further education. Pt planning to d/c home with intermittent assist from family. Recommending HHOT for follow up to maximize independence and safety with ADL and functional mobility upon return home. Pt would benefit from continued skilled OT to address established goals.    Follow Up Recommendations  Home health OT;Supervision - Intermittent    Equipment Recommendations  None recommended by OT    Recommendations for Other Services       Precautions / Restrictions Precautions Precautions: Fall Precaution Comments: watch SPO2 Restrictions Weight Bearing Restrictions: No      Mobility Bed Mobility      General bed mobility comments: Pt OOB with PT upon arrival  Transfers Overall transfer level: Needs assistance Equipment used: Rolling walker (2 wheeled) Transfers: Sit to/from Stand Sit to Stand: Supervision         General transfer comment: for safety with good use of RW    Balance Overall balance assessment: Needs assistance Sitting-balance support: Feet supported;No upper extremity supported Sitting balance-Leahy Scale: Good     Standing balance support: No upper extremity supported;During functional activity Standing balance-Leahy Scale: Fair Standing balance comment: Able to stand at sink and wash hands without UE support                           ADL either performed or assessed with clinical judgement    ADL Overall ADL's : Needs assistance/impaired Eating/Feeding: Set up;Sitting   Grooming: Supervision/safety;Standing;Wash/dry hands   Upper Body Bathing: Set up;Supervision/ safety;Sitting   Lower Body Bathing: Moderate assistance;Sit to/from stand   Upper Body Dressing : Set up;Supervision/safety;Sitting   Lower Body Dressing: Moderate assistance;Sit to/from stand   Toilet Transfer: Supervision/safety;Ambulation;RW           Functional mobility during ADLs: Supervision/safety;Rolling walker       Vision         Perception     Praxis      Pertinent Vitals/Pain Pain Assessment: Faces Faces Pain Scale: No hurt     Hand Dominance Right   Extremity/Trunk Assessment Upper Extremity Assessment Upper Extremity Assessment: RUE deficits/detail RUE Deficits / Details: Pt c/o pain in hand/wrist; reports it occurs intermittently over the last few months. ROM/MMT [facility]   Lower Extremity Assessment Lower Extremity Assessment: Defer to PT evaluation       Communication Communication Communication: No difficulties   Cognition Arousal/Alertness: Awake/alert Behavior During Therapy: WFL for tasks assessed/performed Overall Cognitive Status: Within Functional Limits for tasks assessed                                     General Comments       Exercises     Shoulder Instructions      Home Living Family/patient expects to be discharged to:: Private residence Living Arrangements: Alone Available Help at Discharge: Family;Available PRN/intermittently Type of Home:  Apartment Home Access: Stairs to enter Entrance Stairs-Number of Steps: flight Entrance Stairs-Rails: Right;Left Home Layout: One level     Bathroom Shower/Tub: Tub/shower unit;Curtain   Biochemist, clinical: Standard     Home Equipment: Cane - single point;Shower seat          Prior Functioning/Environment Level of Independence: Needs assistance  Gait / Transfers Assistance  Needed: ambulates with SPC PRN ADL's / Homemaking Assistance Needed: Family does grocery shopping and assists with BADL as needed            OT Problem List: Decreased strength;Decreased activity tolerance;Impaired balance (sitting and/or standing);Decreased knowledge of use of DME or AE;Obesity;Pain      OT Treatment/Interventions: Self-care/ADL training;Therapeutic exercise;Energy conservation;DME and/or AE instruction;Therapeutic activities;Patient/family education;Balance training    OT Goals(Current goals can be found in the care plan section) Acute Rehab OT Goals Patient Stated Goal: to get a first level apartment and return home OT Goal Formulation: With patient Time For Goal Achievement: 05/31/18 Potential to Achieve Goals: Good ADL Goals Pt Will Perform Lower Body Bathing: with modified independence;sit to/from stand;with adaptive equipment Pt Will Perform Lower Body Dressing: with modified independence;sit to/from stand;with adaptive equipment Pt Will Perform Tub/Shower Transfer: Tub transfer;with supervision;ambulating;shower seat;rolling walker Additional ADL Goal #1: Pt will independently verbally recall 3 energy conservation strategies and utilize during ADL.  OT Frequency: Min 2X/week   Barriers to D/C: Inaccessible home environment;Decreased caregiver support  apartment on 2nd floor, family available PRN       Co-evaluation              AM-PAC PT "6 Clicks" Daily Activity     Outcome Measure Help from another person eating meals?: None Help from another person taking care of personal grooming?: A Little Help from another person toileting, which includes using toliet, bedpan, or urinal?: A Little Help from another person bathing (including washing, rinsing, drying)?: A Lot Help from another person to put on and taking off regular upper body clothing?: A Little Help from another person to put on and taking off regular lower body clothing?: A Lot 6 Click  Score: 17   End of Session Equipment Utilized During Treatment: Gait belt;Rolling walker;Oxygen  Activity Tolerance: Patient tolerated treatment well Patient left: in chair;with call bell/phone within reach  OT Visit Diagnosis: Muscle weakness (generalized) (M62.81)                Time: 0017-4944 OT Time Calculation (min): 17 min Charges:  OT General Charges $OT Visit: 1 Visit OT Evaluation $OT Eval Moderate Complexity: 1 Mod G-Codes:     Saul Fabiano A. Ulice Brilliant, M.S., OTR/L Acute Rehab Department: [phone]  Binnie Kand 05/17/2018, 9:01 AM

## 2018-05-17 NOTE — Care Management Important Message (Signed)
Important Message  Patient Details  Name: Catherine Williams MRN: [id number] Date of Birth: [birth year]   Medicare Important Message Given:  Yes    Gianluca Chhim 05/17/2018, 3:10 PM

## 2018-05-17 NOTE — Progress Notes (Signed)
Physical Therapy Treatment Patient Details Name: Catherine Williams MRN: [id number] DOB: [birth year] Today's Date: 05/17/2018    History of Present Illness Pt is a 57 y/o female admitted secondary to SOB and cough. Pt found to have acute on chronic heart failure. PMH including but not limited to CHF, DM, HTN, PVD, COPD.    PT Comments    Patient progressing with therapy today. Ambulating further distances on 1L of O2 satting well. Pt requires frequent rest breaks due to fatigue and will benefit from further therapy to increase activity tolerance and strength. Worked with patient on stairs this morning, at this time pt is unsafe to ambulate stairs alone, requiring min A for stability. Discussed with patient who agrees and reports she will have family help her in. Family not present to educate on safe guarding, will attempt next PT visit.    Follow Up Recommendations  Home health PT;Supervision/Assistance - 24 hour     Equipment Recommendations  Other (comment)(Bari Rollator )    Recommendations for Other Services       Precautions / Restrictions Precautions Precautions: Fall Precaution Comments: watch SPO2 Restrictions Weight Bearing Restrictions: No    Mobility  Bed Mobility Overal bed mobility: Needs Assistance Bed Mobility: Supine to Sit     Supine to sit: Supervision        Transfers Overall transfer level: Needs assistance Equipment used: Rolling walker (2 wheeled) Transfers: Sit to/from Stand Sit to Stand: Supervision            Ambulation/Gait Ambulation/Gait assistance: Supervision Ambulation Distance (Feet): 75 Feet Assistive device: Rolling walker (2 wheeled) Gait Pattern/deviations: Step-through pattern;Decreased step length - right;Decreased step length - left;Decreased stride length Gait velocity: decreased   General Gait Details: patient able to ambulate further distances, chair break after 30'. SpO2 remained in 90's on 1L this  visit   Stairs Stairs: Yes Stairs assistance: Min assist;Min guard Stair Management: One rail Right(hand held on L) Number of Stairs: 4 General stair comments: patient with 2LOB on stairs requring min A to stabilize. discussed need for support getting into home. pt reports family assistance.    Wheelchair Mobility    Modified Rankin (Stroke Patients Only)       Balance Overall balance assessment: Needs assistance Sitting-balance support: Feet supported Sitting balance-Leahy Scale: Good     Standing balance support: During functional activity;Bilateral upper extremity supported Standing balance-Leahy Scale: Poor                              Cognition Arousal/Alertness: Awake/alert Behavior During Therapy: WFL for tasks assessed/performed Overall Cognitive Status: Within Functional Limits for tasks assessed                                        Exercises      General Comments        Pertinent Vitals/Pain Pain Assessment: No/denies pain    Home Living                      Prior Function            PT Goals (current goals can now be found in the care plan section) Acute Rehab PT Goals Patient Stated Goal: to get a first level apartment and return home PT Goal Formulation: With patient Time For Goal Achievement: 05/30/18 Potential  to Achieve Goals: Good Progress towards PT goals: Progressing toward goals    Frequency    Min 3X/week      PT Plan Current plan remains appropriate    Co-evaluation              AM-PAC PT "6 Clicks" Daily Activity  Outcome Measure  Difficulty turning over in bed (including adjusting bedclothes, sheets and blankets)?: None Difficulty moving from lying on back to sitting on the side of the bed? : None Difficulty sitting down on and standing up from a chair with arms (e.g., wheelchair, bedside commode, etc,.)?: Unable Help needed moving to and from a bed to chair (including a  wheelchair)?: A Little Help needed walking in hospital room?: A Little Help needed climbing 3-5 steps with a railing? : A Little 6 Click Score: 18    End of Session Equipment Utilized During Treatment: Oxygen(1 L) Activity Tolerance: Patient tolerated treatment well Patient left: in chair;with call bell/phone within reach   PT Visit Diagnosis: Other abnormalities of gait and mobility (R26.89)     Time: 2330-0762 PT Time Calculation (min) (ACUTE ONLY): 19 min  Charges:  $Gait Training: 8-22 mins                    G Codes:      Reinaldo Berber, PT, DPT Acute Rehab Services Pager: [phone]     Reinaldo Berber 05/17/2018, 8:52 AM

## 2018-05-17 NOTE — Care Management Note (Addendum)
Case Management Note  Patient Details  Name: Catherine Williams MRN: [id number] Date of Birth: 1961-08-22  Subjective/Objective:   Please see previous NCM notes                 Action/Plan:  Spoke to pt. She has RW with seat in room. Offered choice for HH/list provided.  HH arranged with [facility]. Pt requesting 3n1 bedside commode for home.  message sent to [facility] rep with new referral for [facility] and 3n1.    Expected Discharge Date:                  Expected Discharge Plan:  Home/Self Care  In-House Referral:  Clinical Social Work  Discharge planning Services  CM Consult  Post Acute Care Choice:  Durable Medical Equipment Choice offered to:  NA  DME Arranged:  Walker rolling with seat DME Agency:  [place]:  RN [facility] Agency:  [place]  Status of Service:  Completed, signed off  If discussed at [place] of Stay Meetings, dates discussed:    Additional Comments:  Erenest Rasher, RN 05/17/2018, 5:48 PM

## 2018-05-17 NOTE — Progress Notes (Signed)
Pt educated about safety and importance of bed alarm during the night however pt refuses to be on bed alarm. Will continue to round on patient.   Mitra Duling, RN    

## 2018-05-17 NOTE — Plan of Care (Signed)
Nutrition Education Note  RD consulted for nutrition education regarding CHF.  Lab Results  Component Value Date   HGBA1C 8.0 (H) 10/23/2016    PTA DM medications: 20 units insulin aspart TID and 60 units insulin glargine q HS.   CBGS: 79-147 (inpatient orders for glycemic control are 0-15 units insulin aspart TID with meals, 0-5 units insulin aspart q HS, 10 units insulin aspart TID, and 45 units insulin glargine q HS).    Labs reviewed: CBGS: 79-147 (inpatient orders for glycemic control are 0-15 units insulin aspart TID with meals, 10 units insulin aspart TID with meals, and 0-5 units insulin aspart q HS).   Spoke with pt at bedside, who reports improving appetite. She shares that she typically consumes 3 meals per day (Breakfast: cereal or oatmeal and eggs; Lunch: grilled chicken, starch, and vegetable; Dinner: fruit and yogurt or hamburger). Pt shares that she knows that she has been consuming too much sodium and fluid lately. Her daughters are responsible for the cooking and the grocery shopping. Pt makes a list and her daughters purchase food items on list.   Pt reports that she knows what to do and how to eat and has lost weight in the past by heating more healthfully. She shares with this RD that she feels better when she follows a healthy lifestyle, however, becomes comfortable and reverts back to old habits once she sees results. Pt spent a lot of the visit reflecting with this RD how her obesity and chronic health conditions have negatively impacted her quality of life; she became tearful speaking about her family, who are supportive, but are few in number due to pt and daughters moving to  over 10 years ago. She expressed frustration about heavily relying on her daughters for assistance and inability to spend time as much quality time with her grandchildren as she would like to. Her obesity has greatly impacted her mobility and making tasks like cooking very difficult. Pt shares she  will often order out food if she does not feel up to cooking. RD discussed with pt ideas for healthful food items she could have on hand when she does not feel like cooking (such as fruit and yogurt).   Pt has attended a bariatric seminar in the past, but is hesitant to undergo surgery. Explained to pt that continuous lifestyle change is imperative to improvement in health status, regardless if pt desires bariatric surgery or not. Actively listened to pt concerns and provided emotional support and encouragement, as pt has been successful with weight loss in the past. Pt reports that she has seen multiple RDs in the past and has many handouts; she feels comfortable with diet guidelines.   Pt would benefit from additional support as an outpatient; recommend referral to outpatient nutrition services ([facility]) or outpatient bariatrics referral to help pt in achieving weight loss goals.   RD provided "Heart Healthy, Consistent Carbohydrate Nutrition Therapy" handout from the Academy of [employer]etics. Reviewed patient's dietary recall. Provided examples on ways to decrease sodium intake in diet. Discouraged intake of processed foods and use of salt shaker. Encouraged fresh fruits and vegetables as well as whole grain sources of carbohydrates to maximize fiber intake.   RD discussed why it is important for patient to adhere to diet recommendations, and emphasized the role of fluids, foods to avoid, and importance of weighing self daily.   Discussed different food groups and their effects on blood sugar, emphasizing carbohydrate-containing foods. Provided  list of carbohydrates and recommended serving sizes of common foods.  Discussed importance of controlled and consistent carbohydrate intake throughout the day. Provided examples of ways to balance meals/snacks and encouraged intake of high-fiber, whole grain complex carbohydrates. Teach back method used.  Expect  fair compliance.  Body mass index is 62.47 kg/m. Pt meets criteria for extreme obesity, class III based on current BMI.  Current diet order is Heart Healthy/ Carb Modified, patient is consuming approximately 75-100% of meals at this time. Labs and medications reviewed. No further nutrition interventions warranted at this time. RD contact information provided. If additional nutrition issues arise, please re-consult RD.   Catherine Williams, RD, LDN, CDE Pager: [phone] After hours Pager: [phone]

## 2018-05-17 NOTE — Progress Notes (Signed)
Patient Demographics:    Catherine Williams, is a 57 y.o. female, DOB - August 17, 1961, YEM:[id number]  Admit date - 05/12/2018   Admitting Physician Vianne Bulls, MD  Outpatient Primary MD for the patient is Vonna Drafts, FNP  LOS - 4   Chief Complaint  Patient presents with  . Shortness of Breath        Subjective:    Catherine Williams today has no fevers, no emesis,   dyspnea on exertion persist, voiding  Assessment  & Plan :    Principal Problem:   Acute on chronic diastolic CHF (congestive heart failure) (HCC) Active Problems:   OSA (obstructive sleep apnea)   CKD (chronic kidney disease), stage IV (HCC)   Insulin-requiring or dependent type II diabetes mellitus (HCC)   Hypertension   Chronic respiratory failure with hypoxia (HCC)   Hypertensive urgency  Brief Summary  57 y.o. female with medical history significant for BMI 62, OSA, COPD, chronic diastolic CHF, chronic kidney disease stage IV, and insulin-dependent diabetes mellitus admitted on 05/13/2018 with acute on chronic diastolic dysfunction CHF exacerbation. episodes of lethargy associated with high flow oxygen with ABG reflecting uncompensated respiratory acidosis and hypercapnia    Plan:- 1)HFpEF-admitted with acute on chronic diastolic CHF exacerbation in the setting of hypertensive urgency,  last known EF 55 to 60%, clinically improved,  monitor daily weights, fluid input and output, and electrolytes and renal function closely while diuresing her.  Fluid balance is negative.  Lasix on hold due to kidney concerns  2)HTN-admitted with hypertensive urgency as above #1, BP controlled better now, continue Coreg 6.25 mg twice daily, continue BiDil 1 tablet 3 times daily  3)Morbid Obesity/OSA--- continue CPAP nightly may also use CPAP during the day when she falls asleep, episodes of lethargy associated with high flow oxygen with ABG  reflecting uncompensated respiratory acidosis and hypercapnia, avoid high flow oxygen  4)AKI----acute kidney injury on CKD stage - IV ,  creatinine on admission= 3.0 ,   baseline creatinine = 3  , creatinine is now= 6.0, Avoid nephrotoxic agents, dehydration or hypotension , Lasix has been kept on hold  5)DM-poor control, last A1c 8, continue Lantus 45 units nightly, NovoLog 10 units  3 times daily,, Use Novolog/Humalog Sliding scale insulin with Accu-Cheks/Fingersticks as ordered,     6)Rt Wrist Swelling/Pain-suspect gout, venous Dopplers negative for DVT, much improved on prednisone, which has been discontinued  7)Acute on Chronic Hypoxic AND hypercapnic respiratory Ffailure-chronic hypoxia secondary to heart failure, at baseline patient uses 2 L of oxygen continuously, on admission worsening hypoxia due to CHF exacerbation, at this time patient is only requiring 0.5 L of oxygen, be very judicious with oxygen as patient is a CO2 retainer.  Episodes of lethargy associated with high flow oxygen with ABG reflecting uncompensated respiratory acidosis and hypercapnia,   Code Status : full code  Disposition Plan  : Home when renal function improves  DVT Prophylaxis  :   Heparin  Lab Results  Component Value Date   PLT 251 05/16/2018    Inpatient Medications  Scheduled Meds: . aspirin EC  81 mg Oral Daily  . carvedilol  6.25 mg Oral BID WC  . heparin  5,000 Units Subcutaneous Q8H  . insulin aspart  0-15 Units Subcutaneous TID WC  . insulin aspart  0-5 Units Subcutaneous QHS  . insulin aspart  10 Units Subcutaneous TID WC  . insulin glargine  45 Units Subcutaneous QHS  . isosorbide-hydrALAZINE  1 tablet Oral TID  . pantoprazole  40 mg Oral Daily  . rosuvastatin  10 mg Oral QPM  . sodium chloride flush  3 mL Intravenous Q12H   Continuous Infusions: . sodium chloride     PRN Meds:.sodium chloride, acetaminophen, albuterol, cyclobenzaprine, hydrALAZINE, HYDROcodone-acetaminophen,  ondansetron (ZOFRAN) IV, pneumococcal 23 valent vaccine, sodium chloride flush   Anti-infectives (From admission, onward)   None       Objective:   Vitals:   05/16/18 1959 05/16/18 2147 05/17/18 0405 05/17/18 0534  BP: (!) 151/70 137/67 140/69   Pulse: 79  76   Resp: 20  14   Temp: 98.5 F (36.9 C)  98.4 F (36.9 C)   TempSrc: Oral  Axillary   SpO2: 99%  95%   Weight:    (!) 150 kg (330 lb 9.6 oz)  Height:        Wt Readings from Last 3 Encounters:  05/17/18 (!) 150 kg (330 lb 9.6 oz)  02/08/18 (!) 154.9 kg (341 lb 9.6 oz)  09/21/17 (!) 144.2 kg (318 lb)     Intake/Output Summary (Last 24 hours) at 05/17/2018 1622 Last data filed at 05/17/2018 1102 Gross per 24 hour  Intake 360 ml  Output 1300 ml  Net -940 ml    Physical Exam Gen:- Awake Alert, morbidly obese HEENT:- [place].AT, No sclera icterus Nose- [place] 0.5 L/min--we will remove nasal cannula and keep patient on room air Neck-Supple Neck,No JVD,.  Lungs-diminished in bases, no wheezing  CV- S1, S2 normal Abd-  +ve B.Sounds, Abd Soft, No tenderness, increased truncal adiposity Extremity/Skin:-Improved lower extremity edema, right upper extremity swelling and pain also improved  psych-affect is appropriate, oriented x3 Neuro-no new focal deficits, no tremors   Data Review:   Micro Results No results found for this or any previous visit (from the past 240 hour(s)).  Radiology Reports Dg Chest 2 View  Result Date: 05/12/2018 CLINICAL DATA:  Short of breath EXAM: CHEST - 2 VIEW COMPARISON:  11/01/2016 FINDINGS: Mild cardiomegaly with vascular congestion and mild interstitial edema. No pleural effusion. No focal consolidation. No pneumothorax. IMPRESSION: Cardiomegaly with vascular congestion and mild interstitial pulmonary edema. Electronically Signed   By: Donavan Foil M.D.   On: 05/12/2018 22:38   US Renal  Result Date: 05/16/2018 CLINICAL DATA:  Acute kidney injury EXAM: RENAL / URINARY TRACT ULTRASOUND  COMPLETE COMPARISON:  CT 09/21/2011 FINDINGS: Right Kidney: Length: 11.4 cm. Parenchyma is isoechoic to the adjacent liver. Hypoechoic partially exophytic 3.4 x 3.2 x 2.8 cm lesion in the superior pole, and exophytic hypoechoic 3.9 x 3.8 x 2.8 cm lesion from the lower pole; these appear little changed in size from prior CT. No hydronephrosis. Left Kidney: Length: 12.4 cm. Echogenicity within normal limits. No mass or hydronephrosis visualized, with resolution decreased secondary to body habitus. Bladder: Not visualized  possibly decompressed. IMPRESSION: 1. No hydronephrosis. 2. Hypoechoic right renal lesions, not definitively characterized on ultrasound but overall stable since CT 09/21/2011 suggesting benign process such as cyst. Electronically Signed   By: Lucrezia Europe M.D.   On: 05/16/2018 17:28     CBC Recent Labs  Lab 05/12/18 2207 05/14/18 0334 05/16/18 0743  WBC 11.7* 12.8* 10.5  HGB 10.6* 10.2* 9.1*  HCT 37.5 37.5 31.8*  PLT 229  224 251  MCV 89.5 92.4 87.6  MCH 25.3* 25.1* 25.1*  MCHC 28.3* 27.2* 28.6*  RDW 15.6* 15.8* 15.3    Chemistries  Recent Labs  Lab 05/12/18 2207 05/14/18 0334 05/16/18 0743 05/17/18 0443  NA 139 141 136 136  K 4.8 5.1 5.6* 4.9  CL 104 103 98* 99*  CO2 27 28 26 26   GLUCOSE 208* 172* 115* 88  BUN 45* 48* 79* 87*  CREATININE 3.06* 3.38* 6.02* 6.06*  CALCIUM 9.4 9.6 9.4 8.9  AST  --  12*  --   --   ALT  --  10*  --   --   ALKPHOS  --  72  --   --   BILITOT  --  0.8  --   --    ------------------------------------------------------------------------------------------------------------------ No results for input(s): CHOL, HDL, LDLCALC, TRIG, CHOLHDL, LDLDIRECT in the last 72 hours.  Lab Results  Component Value Date   HGBA1C 8.0 (H) 10/23/2016   ------------------------------------------------------------------------------------------------------------------ No results for input(s): TSH, T4TOTAL, T3FREE, THYROIDAB in the last 72  hours.  Invalid input(s): FREET3 ------------------------------------------------------------------------------------------------------------------ No results for input(s): VITAMINB12, FOLATE, FERRITIN, TIBC, IRON, RETICCTPCT in the last 72 hours.  Coagulation profile No results for input(s): INR, PROTIME in the last 168 hours.  No results for input(s): DDIMER in the last 72 hours.  Cardiac Enzymes Recent Labs  Lab 05/13/18 0806 05/13/18 1406  TROPONINI <0.03 0.03*   ------------------------------------------------------------------------------------------------------------------    Component Value Date/Time   BNP 78.9 05/13/2018 0333   Dalessandro Baldyga M.D on 05/17/2018 at 4:22 PM  Between 7am to 7pm - Pager - [phone]  After 7pm go to [email or web address] - password [email or web address]  [facility] -  Office  [phone]  Voice Recognition Viviann Spare dictation system was used to create this note, attempts have been made to correct errors. Please contact the author with questions and/or clarifications.

## 2018-05-18 LAB — BASIC METABOLIC PANEL
Anion gap: 13 (ref 5–15)
BUN: 90 mg/dL — ABNORMAL HIGH (ref 6–20)
CALCIUM: 9.2 mg/dL (ref 8.9–10.3)
CO2: 25 mmol/L (ref 22–32)
CREATININE: 5.88 mg/dL — AB (ref 0.44–1.00)
Chloride: 97 mmol/L — ABNORMAL LOW (ref 101–111)
GFR calc non Af Amer: 7 mL/min — ABNORMAL LOW (ref >60)
GFR, EST AFRICAN AMERICAN: 8 mL/min — AB (ref >60)
GLUCOSE: 73 mg/dL (ref 65–99)
Potassium: 4.6 mmol/L (ref 3.5–5.1)
Sodium: 135 mmol/L (ref 135–145)

## 2018-05-18 LAB — GLUCOSE, CAPILLARY
GLUCOSE-CAPILLARY: 50 mg/dL — AB (ref 65–99)
Glucose-Capillary: 89 mg/dL (ref 65–99)
Glucose-Capillary: 72 mg/dL (ref 65–99)
Glucose-Capillary: 157 mg/dL — ABNORMAL HIGH (ref 65–99)

## 2018-05-18 MED ORDER — ISOSORB DINITRATE-HYDRALAZINE 20-37.5 MG PO TABS: 2.0000 | ORAL_TABLET | Freq: Three times a day (TID) | ORAL | 3 refills | Status: DC

## 2018-05-18 MED ORDER — AMLODIPINE BESYLATE 5 MG PO TABS
5.0000 mg | ORAL_TABLET | Freq: Every day | ORAL | Status: DC
  Administered 2018-05-18: 5 mg via ORAL
  Filled 2018-05-18: qty 1

## 2018-05-18 MED ORDER — CARVEDILOL 6.25 MG PO TABS: 6.2500 mg | ORAL_TABLET | Freq: Two times a day (BID) | ORAL | 3 refills | Status: DC

## 2018-05-18 MED ORDER — FUROSEMIDE 80 MG PO TABS: ORAL_TABLET | ORAL | 3 refills | Status: DC

## 2018-05-18 MED ORDER — AMLODIPINE BESYLATE 5 MG PO TABS: 5.0000 mg | ORAL_TABLET | Freq: Every day | ORAL | 4 refills | Status: DC

## 2018-05-18 NOTE — Progress Notes (Signed)
Pt ready for discharge from unit to pvt auto home with daughter providing transportation. All discharge instructions, meds and folllow up appts reviewed.   Pt demonstrates no s/sx of distrsss.  All personal belongings with pt.

## 2018-05-18 NOTE — Progress Notes (Signed)
Occupational Therapy Treatment Patient Details Name: Catherine Williams MRN: [id number] DOB: [birth year] Today's Date: 05/18/2018    History of present illness Pt is a 57 y/o female admitted secondary to SOB and cough. Pt found to have acute on chronic heart failure. PMH including but not limited to CHF, DM, HTN, PVD, COPD.   OT comments  Focus of session on AE and energy conservation education; pt able to return demo use of AE and verbalized understanding of energy conservation strategies for home. D/c plan remains appropriate. Will continue to follow acutely.   Follow Up Recommendations  Home health OT;Supervision - Intermittent    Equipment Recommendations  None recommended by OT    Recommendations for Other Services      Precautions / Restrictions Precautions Precautions: Fall Precaution Comments: watch SPO2 Restrictions Weight Bearing Restrictions: No       Mobility Bed Mobility      General bed mobility comments: Pt sitting EOB upon arrival  Transfers Overall transfer level: Needs assistance Equipment used: None Transfers: Sit to/from Stand Sit to Stand: Supervision         General transfer comment: for safety    Balance Overall balance assessment: Needs assistance Sitting-balance support: Feet supported;No upper extremity supported Sitting balance-Leahy Scale: Good     Standing balance support: No upper extremity supported Standing balance-Leahy Scale: Fair                            ADL either performed or assessed with clinical judgement   ADL Overall ADL's : Needs assistance/impaired               Lower Body Bathing Details (indicate cue type and reason): Educated on use of long handled sponge for increased independence with bathing     Lower Body Dressing: Supervision/safety;Sit to/from stand;With adaptive equipment Lower Body Dressing Details (indicate cue type and reason): Educated pt on use of reacher, sock aide, and long  handled shoe horn; pt able to return demo use             Functional mobility during ADLs: Supervision/safety;Rolling walker General ADL Comments: Educated pt on pursed lip breathing and energy conservation strategies for home.     Vision       Perception     Praxis      Cognition Arousal/Alertness: Awake/alert Behavior During Therapy: WFL for tasks assessed/performed Overall Cognitive Status: Within Functional Limits for tasks assessed                                          Exercises     Shoulder Instructions       General Comments      Pertinent Vitals/ Pain       Pain Assessment: No/denies pain Faces Pain Scale: No hurt  Home Living                                          Prior Functioning/Environment              Frequency  Min 2X/week        Progress Toward Goals  OT Goals(current goals can now be found in the care plan section)  Progress towards OT goals: Progressing toward goals  Acute  Rehab OT Goals Patient Stated Goal: to get a first level apartment and return home OT Goal Formulation: With patient  Plan Discharge plan remains appropriate    Co-evaluation                 AM-PAC PT "6 Clicks" Daily Activity     Outcome Measure   Help from another person eating meals?: None Help from another person taking care of personal grooming?: A Little Help from another person toileting, which includes using toliet, bedpan, or urinal?: A Little Help from another person bathing (including washing, rinsing, drying)?: A Little Help from another person to put on and taking off regular upper body clothing?: A Little Help from another person to put on and taking off regular lower body clothing?: A Little 6 Click Score: 19    End of Session    OT Visit Diagnosis: Muscle weakness (generalized) (M62.81)   Activity Tolerance Patient tolerated treatment well   Patient Left Other (comment);with call  bell/phone within reach(sitting EOB)   Nurse Communication          Time: 8185-6314 OT Time Calculation (min): 18 min  Charges: OT General Charges $OT Visit: 1 Visit OT Treatments $Self Care/Home Management : 8-22 mins  Leida Luton A. Ulice Brilliant, M.S., OTR/L Acute Rehab Department: [phone]   Binnie Kand 05/18/2018, 10:10 AM

## 2018-05-18 NOTE — Progress Notes (Signed)
Physical Therapy Treatment Patient Details Name: Catherine Williams MRN: [id number] DOB: 1961-11-09 Today's Date: 05/18/2018    History of Present Illness Pt is a 57 y/o female admitted secondary to SOB and cough. Pt found to have acute on chronic heart failure. PMH including but not limited to CHF, DM, HTN, PVD, COPD.    PT Comments    Patients family present this session, focused on education on proper guarding and safety with stairs for home entry. Pt ambulating stairs with improving safety, daughter reports confidence in guarding and providing assistance. Educated again on energy conservation and use of rollator seat. Pt on RA during session, SpO2 88% with activity.     Follow Up Recommendations  Home health PT;Supervision/Assistance - 24 hour     Equipment Recommendations  (Bari Rollator already brought to patient. )    Recommendations for Other Services       Precautions / Restrictions Precautions Precautions: Fall Precaution Comments: watch SPO2 Restrictions Weight Bearing Restrictions: No    Mobility  Bed Mobility Overal bed mobility: Needs Assistance Bed Mobility: Supine to Sit     Supine to sit: Supervision     General bed mobility comments: Pt sitting EOB upon arrival  Transfers Overall transfer level: Needs assistance Equipment used: None Transfers: Sit to/from Stand Sit to Stand: Supervision         General transfer comment: for safety  Ambulation/Gait Ambulation/Gait assistance: Supervision Ambulation Distance (Feet): 75 Feet Assistive device: Rolling walker (2 wheeled) Gait Pattern/deviations: Step-through pattern;Decreased step length - right;Decreased step length - left;Decreased stride length Gait velocity: decreased   General Gait Details: focused on pracitcing getting on and off rollator chair, pt demonstrating safety. ambulated on RA de sat to 85, regains to 92% with seated rest break.    Stairs Stairs: Yes Stairs assistance: Min  assist;Min guard Stair Management: One rail Right Number of Stairs: 8 General stair comments: family present, educated on safe guarding  daugther reports confidence in helping. patient ambulating stairs with min guard this visit, improved since first trial    Wheelchair Mobility    Modified Rankin (Stroke Patients Only)       Balance Overall balance assessment: Needs assistance Sitting-balance support: Feet supported;No upper extremity supported Sitting balance-Leahy Scale: Good     Standing balance support: No upper extremity supported Standing balance-Leahy Scale: Fair Standing balance comment: Able to stand at sink and wash hands without UE support                            Cognition Arousal/Alertness: Awake/alert Behavior During Therapy: WFL for tasks assessed/performed Overall Cognitive Status: Within Functional Limits for tasks assessed                                        Exercises      General Comments        Pertinent Vitals/Pain Pain Assessment: No/denies pain Faces Pain Scale: No hurt    Home Living                      Prior Function            PT Goals (current goals can now be found in the care plan section) Acute Rehab PT Goals Patient Stated Goal: to get a first level apartment and return home PT Goal Formulation: With patient  Time For Goal Achievement: 05/30/18 Potential to Achieve Goals: Good Progress towards PT goals: Progressing toward goals    Frequency    Min 3X/week      PT Plan Current plan remains appropriate    Co-evaluation              AM-PAC PT "6 Clicks" Daily Activity  Outcome Measure  Difficulty turning over in bed (including adjusting bedclothes, sheets and blankets)?: None Difficulty moving from lying on back to sitting on the side of the bed? : None Difficulty sitting down on and standing up from a chair with arms (e.g., wheelchair, bedside commode, etc,.)?:  Unable Help needed moving to and from a bed to chair (including a wheelchair)?: A Little Help needed walking in hospital room?: A Little Help needed climbing 3-5 steps with a railing? : A Little 6 Click Score: 18    End of Session Equipment Utilized During Treatment: Oxygen(1L ) Activity Tolerance: Patient tolerated treatment well Patient left: in chair;with call bell/phone within reach Nurse Communication: Mobility status PT Visit Diagnosis: Other abnormalities of gait and mobility (R26.89)     Time: 1040-1100 PT Time Calculation (min) (ACUTE ONLY): 20 min  Charges:  $Gait Training: 8-22 mins                    G Codes:       Reinaldo Berber, PT, DPT Acute Rehab Services Pager: [place] 05/18/2018, 11:06 AM

## 2018-05-18 NOTE — Progress Notes (Signed)
patient with blood sugar of 50, sitting up on side of bed, asymptomatic. Denies feeling shakey, lightheaded, , awake, alert , talking. Patient states this is not uncommon for her to drop her blood sugars at home either. She states she just eats something. Lunch in room at this time and patient encouraged to eat all she can. Also I container of ensure offered.

## 2018-05-18 NOTE — Progress Notes (Signed)
Blood sugar recheck was 72 as patient consumed all of lunch tray plus 120 mls of apple juice.   Skin warm and dry, pt awake and alert and oriented.

## 2018-05-18 NOTE — Discharge Instructions (Signed)
1)Very low-salt diet advised 2)Limit fluid intake to no more than 1.5 L [50 ounces] per day 3) please note that there have been several changes to your medications 4) weigh yourself daily, call if you gain more than 3 pounds in 1 day or more than 5 pounds in 1 week as your diuretics may need to be adjusted 5) use 1 L of oxygen at bedtime, you do not really need oxygen during the day 6) use of CPAP machine every time you fall asleep this includes daytime naps and overnight sleep 7) repeat BMP/kidney test and electrolytes around 05/21/2018 with your PCP due to concerns about worsening kidney function

## 2018-05-18 NOTE — Progress Notes (Signed)
Physical Therapy Treatment Patient Details Name: Catherine Williams MRN: [id number] DOB: [birth year] Today's Date: 05/18/2018    History of Present Illness Pt is a 57 y/o female admitted secondary to SOB and cough. Pt found to have acute on chronic heart failure. PMH including but not limited to CHF, DM, HTN, PVD, COPD.    PT Comments    Patient progressing with therapy, with increased activity tolerance and demonstrating more safety on stairs this visit. Still requires physical guarding to reduce risk of falls on stairs, family planning to arrive at some point today asked patient to have RN page me to come back and practice with them. Pt mobilizing most of the session on RA, de sats to 87% rises back to 91% with rest. Practiced safe transfers on and off rollator chair pt demonstrating safety with rollator.   Follow Up Recommendations  Home health PT;Supervision/Assistance - 24 hour     Equipment Recommendations  Other (comment)    Recommendations for Other Services       Precautions / Restrictions Precautions Precautions: Fall Precaution Comments: watch SPO2 Restrictions Weight Bearing Restrictions: No    Mobility  Bed Mobility Overal bed mobility: Needs Assistance Bed Mobility: Supine to Sit     Supine to sit: Supervision        Transfers Overall transfer level: Needs assistance Equipment used: Rolling walker (2 wheeled) Transfers: Sit to/from Stand Sit to Stand: Supervision         General transfer comment: for safety with good use of RW  Ambulation/Gait Ambulation/Gait assistance: Supervision Ambulation Distance (Feet): 75 Feet Assistive device: Rolling walker (2 wheeled) Gait Pattern/deviations: Step-through pattern;Decreased step length - right;Decreased step length - left;Decreased stride length Gait velocity: decreased   General Gait Details: focused on pracitcing getting on and off rollator chair, pt demonstrating safety. ambulated on RA de sat to 85,  regains to 92% with seated rest break.    Stairs Stairs: Yes Stairs assistance: Min assist;Min guard Stair Management: One rail Right Number of Stairs: 8 General stair comments: Patient improved strength today ambulating stairs with increased balalnce and activity tolerence on RA. still requires min A for safety, will meet with family when they arrive to discuss.    Wheelchair Mobility    Modified Rankin (Stroke Patients Only)       Balance Overall balance assessment: Needs assistance Sitting-balance support: Feet supported;No upper extremity supported Sitting balance-Leahy Scale: Good     Standing balance support: No upper extremity supported;During functional activity Standing balance-Leahy Scale: Fair Standing balance comment: Able to stand at sink and wash hands without UE support                            Cognition Arousal/Alertness: Awake/alert Behavior During Therapy: WFL for tasks assessed/performed Overall Cognitive Status: Within Functional Limits for tasks assessed                                        Exercises      General Comments        Pertinent Vitals/Pain Pain Assessment: Faces Faces Pain Scale: No hurt    Home Living                      Prior Function            PT Goals (current goals can  now be found in the care plan section) Acute Rehab PT Goals Patient Stated Goal: to get a first level apartment and return home PT Goal Formulation: With patient Time For Goal Achievement: 05/30/18 Potential to Achieve Goals: Good Progress towards PT goals: Progressing toward goals    Frequency    Min 3X/week      PT Plan Current plan remains appropriate    Co-evaluation              AM-PAC PT "6 Clicks" Daily Activity  Outcome Measure  Difficulty turning over in bed (including adjusting bedclothes, sheets and blankets)?: None Difficulty moving from lying on back to sitting on the side of the  bed? : None Difficulty sitting down on and standing up from a chair with arms (e.g., wheelchair, bedside commode, etc,.)?: Unable Help needed moving to and from a bed to chair (including a wheelchair)?: A Little Help needed walking in hospital room?: A Little Help needed climbing 3-5 steps with a railing? : A Little 6 Click Score: 18    End of Session Equipment Utilized During Treatment: Oxygen(1L ) Activity Tolerance: Patient tolerated treatment well Patient left: in chair;with call bell/phone within reach Nurse Communication: Mobility status PT Visit Diagnosis: Other abnormalities of gait and mobility (R26.89)     Time: 0820-0850(increased time looking for O2 tank carrier ) PT Time Calculation (min) (ACUTE ONLY): 30 min  Charges:  $Gait Training: 8-22 mins                    G Codes:       Reinaldo Berber, PT, DPT Acute Rehab Services Pager: [phone]     Reinaldo Berber 05/18/2018, 9:13 AM

## 2018-05-18 NOTE — Discharge Summary (Signed)
Catherine Williams, is a 57 y.o. female  DOB Nov 09, 1961  MRN [id number].  Admission date:  05/12/2018  Admitting Physician  Vianne Bulls, MD  Discharge Date:  05/18/2018   Primary MD  Vonna Drafts, FNP  Recommendations for primary care physician for things to follow:   1)Very low-salt diet advised 2)Limit fluid intake to no more than 1.5 L [50 ounces] per day 3) please note that there have been several changes to your medications 4) weigh yourself daily, call if you gain more than 3 pounds in 1 day or more than 5 pounds in 1 week as your diuretics may need to be adjusted 5) use 1 L of oxygen at bedtime, you do not really need oxygen during the day 6) use of CPAP machine every time you fall asleep this includes daytime naps and overnight sleep 7) repeat BMP/kidney test and electrolytes around 05/21/2018 with your PCP due to concerns about worsening kidney function  Admission Diagnosis  Acute on chronic congestive heart failure, unspecified heart failure type ([place]) [I50.9]   Discharge Diagnosis  Acute on chronic congestive heart failure, unspecified heart failure type ([place]) [I50.9]   Principal Problem:   Acute on chronic diastolic CHF (congestive heart failure) ([place]) Active Problems:   OSA (obstructive sleep apnea)   CKD (chronic kidney disease), stage IV ([place])   Insulin-requiring or dependent type II diabetes mellitus ([place])   Hypertension   Chronic respiratory failure with hypoxia ([place])   Hypertensive urgency      Past Medical History:  Diagnosis Date  . Anemia   . Arthritis Dec. 2014   Gout  . Asthma   . CHF (congestive heart failure) ([place])   . COPD (chronic obstructive pulmonary disease) ([place])   . Diabetes mellitus   . Heart failure   . Hyperlipidemia   . Hypertension   . Kidney disease   . Morbid obesity ([place])   . Peripheral vascular disease ([place])   . Pinched nerve   . Sleep apnea      Past Surgical History:  Procedure Laterality Date  . Atherectomy and angioplasty  10/18/2011   left posterior tibial artery  . HAND SURGERY     right  . OVARY SURGERY     Left  . TOE AMPUTATION  Sept. 25,2012   Left 4th and 5th toes       HPI  from the history and physical done on the day of admission:    Chief Complaint: SOB, cough, orthopnea   HPI: Catherine Williams is a 57 y.o. female with medical history significant for BMI 62, OSA, COPD, chronic diastolic CHF, chronic kidney disease stage IV, and insulin-dependent diabetes mellitus, now presenting to the emergency department for evaluation of shortness of breath, mild chest discomfort, right wrist pain, and orthopnea.  Patient reports that the symptoms developed approximately 4 days ago and have progressively worsened.  She denies fevers or chills, and she has not noted any alleviating or exacerbating factors.  In terms of the right wrist  pain, she denies recent fall or trauma.  She reports history of gout in the lower extremities, but never in the upper extremity.  ED Course: Upon arrival to the ED, patient is found to be afebrile, saturating 90 on room air, slightly tachypneic, and hypertensive with systolic pressure of 147.  EKG features a sinus rhythm with RBBB, LAFB, and LVH with repolarization abnormality.  Chest x-ray is notable for cardiomegaly with vascular congestion and interstitial edema.  Chemistry panel is notable for a BUN of 45 and creatinine 3.06, consistent with her apparent baseline.  CBC is notable for a chronic and stable normocytic anemia.  Troponin is negative x2.  Patient was treated with 80 mg IV Lasix and IV hydralazine in the ED.  Supplemental oxygen was administered.  She remains hemodynamically stable and will be admitted to the telemetry unit for ongoing evaluation and management.    Hospital Course:    Brief Summary 57 y.o.femalewith medical history significant forBMI 62, OSA, COPD, chronic  diastolic CHF, chronic kidney disease stage IV, and insulin-dependent diabetes mellitus admitted on 05/13/2018 with acute on chronic diastolic dysfunction CHF exacerbation. episodes of lethargy associated with high flow oxygen with ABG reflecting uncompensated respiratory acidosis and hypercapnia    Plan:- 1)HFpEF-admitted with acute on chronic diastolic CHF exacerbation in the setting of hypertensive urgency,  last known EF 55 to 60%, clinically improved after diuresis,    Fluid balance is negative.   Discharge him on Lasix 80 mg nightly 40 mg every afternoon, repeat labs as outpatient as ordered  2)HTN-admitted with hypertensive urgency as above #1, BP controlled better now, continue Coreg 6.25 mg twice daily, continue BiDil 1 tablet 3 times daily, as above #1  3)Morbid Obesity/OSA--- continue CPAP nightly, patient advised to use CPAP during the day when she falls asleep, episodes of lethargy associated with high flow oxygen with ABG reflecting uncompensated respiratory acidosis and hypercapnia, avoid high flow oxygen  4)AKI----acute kidney injury on CKD stage - IV ,  creatinine on admission= 3.0 ,   baseline creatinine = 3  , creatinine stabilized at 5.8, repeat BMP as outpatient as outlined/advised   5)DM-poor control, last A1c 8, continue insulin regimen   6)Rt Wrist Swelling/Pain-suspect gout, venous Dopplers negative for DVT, much improved on prednisone, which has been discontinued  7)Acute on Chronic Hypoxic AND hypercapnic respiratory Ffailure-chronic hypoxia secondary to heart failure, at baseline patient uses 2 L of oxygen continuously, on admission worsening hypoxia due to CHF exacerbation, at this time patient is only requiring 0.5 L of oxygen, be very judicious with oxygen as patient is a CO2 retainer.  Episodes of lethargy associated with high flow oxygen with ABG reflecting uncompensated respiratory acidosis and hypercapnia, see discharge instructions for oxygen use  Code  Status : full code  Disposition Plan  : Home with home health services  Discharge Condition: stable  Follow UP  Follow-up Information    Nigel Mormon, MD Follow up on 05/28/2018.   Specialty:  Cardiology Why:  2 PM Contact information: [place] [address] [place] Follow up.   Why:  Arranged for rolling walker to be delivered to patient room prior to discharge Contact information: [place] [address] [phone]        [facility] Follow up.   Specialty:  Home Health Services Why:  Home Health RN- agency will call to arrange  initial visit Contact information: [address] [place] [address] [phone]            Diet and Activity recommendation:  As advised  Discharge Instructions     Discharge Instructions    (HEART FAILURE PATIENTS) Call MD:  Anytime you have any of the following symptoms: 1) 3 pound weight gain in 24 hours or 5 pounds in 1 week 2) shortness of breath, with or without a dry hacking cough 3) swelling in the hands, feet or stomach 4) if you have to sleep on extra pillows at night in order to breathe.   Complete by:  As directed    AMB referral to CHF clinic   Complete by:  As directed    Call MD for:  difficulty breathing, headache or visual disturbances   Complete by:  As directed    Call MD for:  persistant dizziness or light-headedness   Complete by:  As directed    Call MD for:  persistant nausea and vomiting   Complete by:  As directed    Call MD for:  temperature >100.4   Complete by:  As directed    Diet - low sodium heart healthy   Complete by:  As directed    Discharge instructions   Complete by:  As directed    1)Very low-salt diet advised 2)Limit fluid intake to no more than 1.5 L [50 ounces] per day 3) please note that there have been several changes to your medications 4) weigh yourself  daily, call if you gain more than 3 pounds in 1 day or more than 5 pounds in 1 week as your diuretics may need to be adjusted 5) use 1 L of oxygen at bedtime, you do not really need oxygen during the day 6) use of CPAP machine every time you fall asleep this includes daytime naps and overnight sleep 7) repeat BMP/kidney test and electrolytes around 05/21/2018 with your PCP due to concerns about worsening kidney function   Increase activity slowly   Complete by:  As directed         Discharge Medications     Allergies as of 05/18/2018      Reactions   Omnipaque [iohexol] Nausea And Vomiting   Contrast Dye- Effects Kidney's      Medication List    STOP taking these medications   hydrALAZINE 25 MG tablet Commonly known as:  APRESOLINE   metoprolol tartrate 50 MG tablet Commonly known as:  LOPRESSOR     TAKE these medications   albuterol 108 (90 Base) MCG/ACT inhaler Commonly known as:  PROVENTIL HFA;VENTOLIN HFA Inhale 1-2 puffs into the lungs every 6 (six) hours as needed for wheezing.   allopurinol 100 MG tablet Commonly known as:  ZYLOPRIM Take 1 tablet (100 mg total) by mouth at bedtime.   amLODipine 5 MG tablet Commonly known as:  NORVASC Take 1 tablet (5 mg total) by mouth daily. Start taking on:  05/19/2018   aspirin 81 MG tablet Take 81 mg by mouth daily.   carvedilol 6.25 MG tablet Commonly known as:  COREG Take 1 tablet (6.25 mg total) by mouth 2 (two) times daily with a meal.   cyclobenzaprine 10 MG tablet Commonly known as:  FLEXERIL Take 1 tablet (10 mg total) by mouth 2 (two) times daily as needed for muscle spasms.   Darbepoetin Alfa 60 MCG/0.3ML Sosy injection Commonly known as:  ARANESP Inject 0.3 mLs (60 mcg total) into the skin every Thursday  at 6pm. What changed:  when to take this   ferrous sulfate 325 (65 FE) MG tablet Take 1 tablet (325 mg total) by mouth 2 (two) times daily with a meal.   furosemide 80 MG tablet Commonly known as:   LASIX Take 1 tablet (80 mg)  in the morning, and 1/2 tablet (40 mg)  in the evening What changed:    how much to take  how to take this  when to take this  additional instructions   insulin aspart 100 UNIT/ML injection Commonly known as:  novoLOG Inject 20 Units into the skin 3 (three) times daily with meals.   insulin glargine 100 UNIT/ML injection Commonly known as:  LANTUS Inject 60 Units into the skin at bedtime.   isosorbide-hydrALAZINE 20-37.5 MG tablet Commonly known as:  BIDIL Take 2 tablets by mouth 3 (three) times daily.   loratadine 10 MG tablet Commonly known as:  CLARITIN Take 10 mg by mouth daily.   pantoprazole 40 MG tablet Commonly known as:  PROTONIX Take 1 tablet (40 mg total) by mouth daily.   rosuvastatin 40 MG tablet Commonly known as:  CRESTOR Take 40 mg by mouth every evening.            Durable Medical Equipment  (From admission, onward)        Start     Ordered   05/17/18 1752  For home use only DME 3 n 1  Once    Comments:  bariatric   05/17/18 1752   05/16/18 1559  For home use only DME 4 wheeled rolling walker with seat  Once    Comments:  bariatric  Question:  Patient needs a walker to treat with the following condition  Answer:  CHF (congestive heart failure) ([place])   05/16/18 1558   05/16/18 1510  For home use only DME Walker rolling  Once    Question:  Patient needs a walker to treat with the following condition  Answer:  Muscle weakness   05/16/18 1509      Major procedures and Radiology Reports - PLEASE review detailed and final reports for all details, in brief -     Dg Chest 2 View  Result Date: 05/12/2018 CLINICAL DATA:  Short of breath EXAM: CHEST - 2 VIEW COMPARISON:  11/01/2016 FINDINGS: Mild cardiomegaly with vascular congestion and mild interstitial edema. No pleural effusion. No focal consolidation. No pneumothorax. IMPRESSION: Cardiomegaly with vascular congestion and mild interstitial pulmonary edema.  Electronically Signed   By: Donavan Foil M.D.   On: 05/12/2018 22:38   US Renal  Result Date: 05/16/2018 CLINICAL DATA:  Acute kidney injury EXAM: RENAL / URINARY TRACT ULTRASOUND COMPLETE COMPARISON:  CT 09/21/2011 FINDINGS: Right Kidney: Length: 11.4 cm. Parenchyma is isoechoic to the adjacent liver. Hypoechoic partially exophytic 3.4 x 3.2 x 2.8 cm lesion in the superior pole, and exophytic hypoechoic 3.9 x 3.8 x 2.8 cm lesion from the lower pole; these appear little changed in size from prior CT. No hydronephrosis. Left Kidney: Length: 12.4 cm. Echogenicity within normal limits. No mass or hydronephrosis visualized, with resolution decreased secondary to body habitus. Bladder: Not visualized  possibly decompressed. IMPRESSION: 1. No hydronephrosis. 2. Hypoechoic right renal lesions, not definitively characterized on ultrasound but overall stable since CT 09/21/2011 suggesting benign process such as cyst. Electronically Signed   By: Lucrezia Europe M.D.   On: 05/16/2018 17:28    Micro Results   No results found for this or any previous visit (from  the past 240 hour(s)).     Today   Subjective    Tashonda Pinkus today has no new  Complaints, voiding well           Patient has been seen and examined prior to discharge   Objective   Blood pressure (!) 145/76, pulse 73, temperature 98.9 F (37.2 C), temperature source Oral, resp. rate 15, height 5\' 1"  (1.549 m), weight (!) 149.4 kg (329 lb 6.4 oz), SpO2 95 %.   Intake/Output Summary (Last 24 hours) at 05/18/2018 1126 Last data filed at 05/18/2018 0516 Gross per 24 hour  Intake 240 ml  Output 1500 ml  Net -1260 ml    Exam Gen:- Awake Alert, morbidly obese HEENT:- [place].AT, No sclera icterus Nose- [place] 0.5 L/min--we will remove nasal cannula and keep patient on room air Neck-Supple Neck,No JVD,.  Lungs-improved air movement bilaterally, no wheezing  CV- S1, S2 normal Abd-  +ve B.Sounds, Abd Soft, No tenderness, increased truncal  adiposity Extremity/Skin:-Improved lower extremity edema, right upper extremity swelling and pain also improved  psych-affect is appropriate, oriented x3 Neuro-no new focal deficits, no tremors     Data Review   CBC w Diff:  Lab Results  Component Value Date   WBC 10.5 05/16/2018   HGB 9.1 (L) 05/16/2018   HCT 31.8 (L) 05/16/2018   PLT 251 05/16/2018   LYMPHOPCT 15 11/01/2016   MONOPCT 8 11/01/2016   EOSPCT 1 11/01/2016   BASOPCT 0 11/01/2016    CMP:  Lab Results  Component Value Date   NA 135 05/18/2018   K 4.6 05/18/2018   CL 97 (L) 05/18/2018   CO2 25 05/18/2018   BUN 90 (H) 05/18/2018   CREATININE 5.88 (H) 05/18/2018   PROT 7.3 05/14/2018   ALBUMIN 2.9 (L) 05/14/2018   BILITOT 0.8 05/14/2018   ALKPHOS 72 05/14/2018   AST 12 (L) 05/14/2018   ALT 10 (L) 05/14/2018  .   Total Discharge time is about 33 minutes  Roxan Hockey M.D on 05/18/2018 at 11:26 AM  [facility]   Office  [phone]  Voice Recognition Viviann Spare dictation system was used to create this note, attempts have been made to correct errors. Please contact the author with questions and/or clarifications.

## 2018-05-23 ENCOUNTER — Encounter (HOSPITAL_COMMUNITY): Payer: Medicare Other

## 2018-05-24 ENCOUNTER — Encounter (HOSPITAL_COMMUNITY)
Admission: RE | Admit: 2018-05-24 | Discharge: 2018-05-24 | Disposition: A | Discharge status: Home / Self Care | Payer: Medicare Other | Source: Ambulatory Visit | Attending: Nephrology | Admitting: Nephrology

## 2018-05-24 VITALS — BP 136/61 | HR 77 | Temp 98.7°F | Resp 18

## 2018-05-24 DIAGNOSIS — M1990 Unspecified osteoarthritis, unspecified site: Secondary | ICD-10-CM | POA: Diagnosis not present

## 2018-05-24 DIAGNOSIS — E1122 Type 2 diabetes mellitus with diabetic chronic kidney disease: Secondary | ICD-10-CM | POA: Diagnosis not present

## 2018-05-24 DIAGNOSIS — J449 Chronic obstructive pulmonary disease, unspecified: Secondary | ICD-10-CM | POA: Diagnosis not present

## 2018-05-24 DIAGNOSIS — D631 Anemia in chronic kidney disease: Secondary | ICD-10-CM | POA: Diagnosis not present

## 2018-05-24 DIAGNOSIS — I160 Hypertensive urgency: Secondary | ICD-10-CM | POA: Diagnosis not present

## 2018-05-24 DIAGNOSIS — Z79899 Other long term (current) drug therapy: Secondary | ICD-10-CM | POA: Insufficient documentation

## 2018-05-24 DIAGNOSIS — Z794 Long term (current) use of insulin: Secondary | ICD-10-CM | POA: Diagnosis not present

## 2018-05-24 DIAGNOSIS — Z9981 Dependence on supplemental oxygen: Secondary | ICD-10-CM | POA: Diagnosis not present

## 2018-05-24 DIAGNOSIS — E1151 Type 2 diabetes mellitus with diabetic peripheral angiopathy without gangrene: Secondary | ICD-10-CM | POA: Diagnosis not present

## 2018-05-24 DIAGNOSIS — N183 Chronic kidney disease, stage 3 (moderate): Secondary | ICD-10-CM | POA: Diagnosis not present

## 2018-05-24 DIAGNOSIS — I5033 Acute on chronic diastolic (congestive) heart failure: Secondary | ICD-10-CM | POA: Diagnosis not present

## 2018-05-24 DIAGNOSIS — Z5181 Encounter for therapeutic drug level monitoring: Secondary | ICD-10-CM | POA: Diagnosis not present

## 2018-05-24 DIAGNOSIS — N184 Chronic kidney disease, stage 4 (severe): Secondary | ICD-10-CM | POA: Diagnosis not present

## 2018-05-24 DIAGNOSIS — Z89422 Acquired absence of other left toe(s): Secondary | ICD-10-CM | POA: Diagnosis not present

## 2018-05-24 DIAGNOSIS — Z7951 Long term (current) use of inhaled steroids: Secondary | ICD-10-CM | POA: Diagnosis not present

## 2018-05-24 DIAGNOSIS — E785 Hyperlipidemia, unspecified: Secondary | ICD-10-CM | POA: Diagnosis not present

## 2018-05-24 DIAGNOSIS — G4733 Obstructive sleep apnea (adult) (pediatric): Secondary | ICD-10-CM | POA: Diagnosis not present

## 2018-05-24 DIAGNOSIS — I130 Hypertensive heart and chronic kidney disease with heart failure and stage 1 through stage 4 chronic kidney disease, or unspecified chronic kidney disease: Secondary | ICD-10-CM | POA: Diagnosis not present

## 2018-05-24 DIAGNOSIS — Z6841 Body Mass Index (BMI) 40.0 and over, adult: Secondary | ICD-10-CM | POA: Diagnosis not present

## 2018-05-24 LAB — RENAL FUNCTION PANEL
ANION GAP: 11 (ref 5–15)
Albumin: 3 g/dL — ABNORMAL LOW (ref 3.5–5.0)
BUN: 77 mg/dL — ABNORMAL HIGH (ref 6–20)
CALCIUM: 9.5 mg/dL (ref 8.9–10.3)
CO2: 27 mmol/L (ref 22–32)
Chloride: 101 mmol/L (ref 101–111)
Creatinine, Ser: 6 mg/dL — ABNORMAL HIGH (ref 0.44–1.00)
GFR calc non Af Amer: 7 mL/min — ABNORMAL LOW (ref >60)
GFR, EST AFRICAN AMERICAN: 8 mL/min — AB (ref >60)
GLUCOSE: 54 mg/dL — AB (ref 65–99)
POTASSIUM: 4.4 mmol/L (ref 3.5–5.1)
Phosphorus: 5.5 mg/dL — ABNORMAL HIGH (ref 2.5–4.6)
Sodium: 139 mmol/L (ref 135–145)

## 2018-05-24 LAB — POCT HEMOGLOBIN-HEMACUE: HEMOGLOBIN: 9.4 g/dL — AB (ref 12.0–15.0)

## 2018-05-24 MED ORDER — DARBEPOETIN ALFA 150 MCG/0.3ML IJ SOSY: 120.0000 ug | PREFILLED_SYRINGE | INTRAMUSCULAR | Status: DC

## 2018-05-24 MED ORDER — DARBEPOETIN ALFA 60 MCG/0.3ML IJ SOSY
PREFILLED_SYRINGE | INTRAMUSCULAR | Status: AC
Start: 2018-05-24 — End: 2018-05-24
  Administered 2018-05-24: 120 ug
  Filled 2018-05-24: qty 0.6

## 2018-05-24 MED ORDER — DARBEPOETIN ALFA 150 MCG/0.3ML IJ SOSY
PREFILLED_SYRINGE | INTRAMUSCULAR | Status: AC
  Filled 2018-05-24: qty 0.3

## 2018-05-27 DIAGNOSIS — E1122 Type 2 diabetes mellitus with diabetic chronic kidney disease: Secondary | ICD-10-CM | POA: Diagnosis not present

## 2018-05-27 DIAGNOSIS — I5033 Acute on chronic diastolic (congestive) heart failure: Secondary | ICD-10-CM | POA: Diagnosis not present

## 2018-05-27 DIAGNOSIS — J449 Chronic obstructive pulmonary disease, unspecified: Secondary | ICD-10-CM | POA: Diagnosis not present

## 2018-05-27 DIAGNOSIS — N184 Chronic kidney disease, stage 4 (severe): Secondary | ICD-10-CM | POA: Diagnosis not present

## 2018-05-27 DIAGNOSIS — I160 Hypertensive urgency: Secondary | ICD-10-CM | POA: Diagnosis not present

## 2018-05-27 DIAGNOSIS — I130 Hypertensive heart and chronic kidney disease with heart failure and stage 1 through stage 4 chronic kidney disease, or unspecified chronic kidney disease: Secondary | ICD-10-CM | POA: Diagnosis not present

## 2018-05-28 DIAGNOSIS — I160 Hypertensive urgency: Secondary | ICD-10-CM | POA: Diagnosis not present

## 2018-05-28 DIAGNOSIS — J449 Chronic obstructive pulmonary disease, unspecified: Secondary | ICD-10-CM | POA: Diagnosis not present

## 2018-05-28 DIAGNOSIS — I5033 Acute on chronic diastolic (congestive) heart failure: Secondary | ICD-10-CM | POA: Diagnosis not present

## 2018-05-28 DIAGNOSIS — I130 Hypertensive heart and chronic kidney disease with heart failure and stage 1 through stage 4 chronic kidney disease, or unspecified chronic kidney disease: Secondary | ICD-10-CM | POA: Diagnosis not present

## 2018-05-28 DIAGNOSIS — E1122 Type 2 diabetes mellitus with diabetic chronic kidney disease: Secondary | ICD-10-CM | POA: Diagnosis not present

## 2018-05-28 DIAGNOSIS — N184 Chronic kidney disease, stage 4 (severe): Secondary | ICD-10-CM | POA: Diagnosis not present

## 2018-05-29 DIAGNOSIS — E1122 Type 2 diabetes mellitus with diabetic chronic kidney disease: Secondary | ICD-10-CM | POA: Diagnosis not present

## 2018-05-29 DIAGNOSIS — I509 Heart failure, unspecified: Secondary | ICD-10-CM | POA: Diagnosis not present

## 2018-05-29 DIAGNOSIS — R5383 Other fatigue: Secondary | ICD-10-CM | POA: Diagnosis not present

## 2018-05-29 DIAGNOSIS — R0602 Shortness of breath: Secondary | ICD-10-CM | POA: Diagnosis not present

## 2018-05-31 DIAGNOSIS — I5033 Acute on chronic diastolic (congestive) heart failure: Secondary | ICD-10-CM | POA: Diagnosis not present

## 2018-05-31 DIAGNOSIS — I130 Hypertensive heart and chronic kidney disease with heart failure and stage 1 through stage 4 chronic kidney disease, or unspecified chronic kidney disease: Secondary | ICD-10-CM | POA: Diagnosis not present

## 2018-05-31 DIAGNOSIS — N184 Chronic kidney disease, stage 4 (severe): Secondary | ICD-10-CM | POA: Diagnosis not present

## 2018-05-31 DIAGNOSIS — I160 Hypertensive urgency: Secondary | ICD-10-CM | POA: Diagnosis not present

## 2018-05-31 DIAGNOSIS — J449 Chronic obstructive pulmonary disease, unspecified: Secondary | ICD-10-CM | POA: Diagnosis not present

## 2018-05-31 DIAGNOSIS — E1122 Type 2 diabetes mellitus with diabetic chronic kidney disease: Secondary | ICD-10-CM | POA: Diagnosis not present

## 2018-06-01 DIAGNOSIS — I160 Hypertensive urgency: Secondary | ICD-10-CM | POA: Diagnosis not present

## 2018-06-01 DIAGNOSIS — I130 Hypertensive heart and chronic kidney disease with heart failure and stage 1 through stage 4 chronic kidney disease, or unspecified chronic kidney disease: Secondary | ICD-10-CM | POA: Diagnosis not present

## 2018-06-01 DIAGNOSIS — E1122 Type 2 diabetes mellitus with diabetic chronic kidney disease: Secondary | ICD-10-CM | POA: Diagnosis not present

## 2018-06-01 DIAGNOSIS — N184 Chronic kidney disease, stage 4 (severe): Secondary | ICD-10-CM | POA: Diagnosis not present

## 2018-06-01 DIAGNOSIS — J449 Chronic obstructive pulmonary disease, unspecified: Secondary | ICD-10-CM | POA: Diagnosis not present

## 2018-06-01 DIAGNOSIS — I5033 Acute on chronic diastolic (congestive) heart failure: Secondary | ICD-10-CM | POA: Diagnosis not present

## 2018-06-04 DIAGNOSIS — N184 Chronic kidney disease, stage 4 (severe): Secondary | ICD-10-CM | POA: Diagnosis not present

## 2018-06-04 DIAGNOSIS — E1122 Type 2 diabetes mellitus with diabetic chronic kidney disease: Secondary | ICD-10-CM | POA: Diagnosis not present

## 2018-06-04 DIAGNOSIS — I130 Hypertensive heart and chronic kidney disease with heart failure and stage 1 through stage 4 chronic kidney disease, or unspecified chronic kidney disease: Secondary | ICD-10-CM | POA: Diagnosis not present

## 2018-06-04 DIAGNOSIS — J449 Chronic obstructive pulmonary disease, unspecified: Secondary | ICD-10-CM | POA: Diagnosis not present

## 2018-06-04 DIAGNOSIS — I160 Hypertensive urgency: Secondary | ICD-10-CM | POA: Diagnosis not present

## 2018-06-04 DIAGNOSIS — I5033 Acute on chronic diastolic (congestive) heart failure: Secondary | ICD-10-CM | POA: Diagnosis not present

## 2018-06-05 DIAGNOSIS — J039 Acute tonsillitis, unspecified: Secondary | ICD-10-CM | POA: Diagnosis not present

## 2018-06-05 DIAGNOSIS — M545 Low back pain: Secondary | ICD-10-CM | POA: Diagnosis not present

## 2018-06-05 DIAGNOSIS — J029 Acute pharyngitis, unspecified: Secondary | ICD-10-CM | POA: Diagnosis not present

## 2018-06-07 DIAGNOSIS — N184 Chronic kidney disease, stage 4 (severe): Secondary | ICD-10-CM | POA: Diagnosis not present

## 2018-06-07 DIAGNOSIS — I5033 Acute on chronic diastolic (congestive) heart failure: Secondary | ICD-10-CM | POA: Diagnosis not present

## 2018-06-07 DIAGNOSIS — I160 Hypertensive urgency: Secondary | ICD-10-CM | POA: Diagnosis not present

## 2018-06-07 DIAGNOSIS — E1122 Type 2 diabetes mellitus with diabetic chronic kidney disease: Secondary | ICD-10-CM | POA: Diagnosis not present

## 2018-06-07 DIAGNOSIS — J449 Chronic obstructive pulmonary disease, unspecified: Secondary | ICD-10-CM | POA: Diagnosis not present

## 2018-06-07 DIAGNOSIS — I130 Hypertensive heart and chronic kidney disease with heart failure and stage 1 through stage 4 chronic kidney disease, or unspecified chronic kidney disease: Secondary | ICD-10-CM | POA: Diagnosis not present

## 2018-06-12 DIAGNOSIS — J449 Chronic obstructive pulmonary disease, unspecified: Secondary | ICD-10-CM | POA: Diagnosis not present

## 2018-06-12 DIAGNOSIS — I5033 Acute on chronic diastolic (congestive) heart failure: Secondary | ICD-10-CM | POA: Diagnosis not present

## 2018-06-12 DIAGNOSIS — I130 Hypertensive heart and chronic kidney disease with heart failure and stage 1 through stage 4 chronic kidney disease, or unspecified chronic kidney disease: Secondary | ICD-10-CM | POA: Diagnosis not present

## 2018-06-12 DIAGNOSIS — E1122 Type 2 diabetes mellitus with diabetic chronic kidney disease: Secondary | ICD-10-CM | POA: Diagnosis not present

## 2018-06-12 DIAGNOSIS — N184 Chronic kidney disease, stage 4 (severe): Secondary | ICD-10-CM | POA: Diagnosis not present

## 2018-06-12 DIAGNOSIS — I160 Hypertensive urgency: Secondary | ICD-10-CM | POA: Diagnosis not present

## 2018-06-13 DIAGNOSIS — I5033 Acute on chronic diastolic (congestive) heart failure: Secondary | ICD-10-CM | POA: Diagnosis not present

## 2018-06-13 DIAGNOSIS — E1122 Type 2 diabetes mellitus with diabetic chronic kidney disease: Secondary | ICD-10-CM | POA: Diagnosis not present

## 2018-06-13 DIAGNOSIS — N184 Chronic kidney disease, stage 4 (severe): Secondary | ICD-10-CM | POA: Diagnosis not present

## 2018-06-13 DIAGNOSIS — I160 Hypertensive urgency: Secondary | ICD-10-CM | POA: Diagnosis not present

## 2018-06-13 DIAGNOSIS — J449 Chronic obstructive pulmonary disease, unspecified: Secondary | ICD-10-CM | POA: Diagnosis not present

## 2018-06-13 DIAGNOSIS — I130 Hypertensive heart and chronic kidney disease with heart failure and stage 1 through stage 4 chronic kidney disease, or unspecified chronic kidney disease: Secondary | ICD-10-CM | POA: Diagnosis not present

## 2018-06-14 DIAGNOSIS — E1122 Type 2 diabetes mellitus with diabetic chronic kidney disease: Secondary | ICD-10-CM | POA: Diagnosis not present

## 2018-06-14 DIAGNOSIS — I5033 Acute on chronic diastolic (congestive) heart failure: Secondary | ICD-10-CM | POA: Diagnosis not present

## 2018-06-14 DIAGNOSIS — J449 Chronic obstructive pulmonary disease, unspecified: Secondary | ICD-10-CM | POA: Diagnosis not present

## 2018-06-14 DIAGNOSIS — N184 Chronic kidney disease, stage 4 (severe): Secondary | ICD-10-CM | POA: Diagnosis not present

## 2018-06-14 DIAGNOSIS — I130 Hypertensive heart and chronic kidney disease with heart failure and stage 1 through stage 4 chronic kidney disease, or unspecified chronic kidney disease: Secondary | ICD-10-CM | POA: Diagnosis not present

## 2018-06-14 DIAGNOSIS — I160 Hypertensive urgency: Secondary | ICD-10-CM | POA: Diagnosis not present

## 2018-06-18 DIAGNOSIS — E1122 Type 2 diabetes mellitus with diabetic chronic kidney disease: Secondary | ICD-10-CM | POA: Diagnosis not present

## 2018-06-18 DIAGNOSIS — N184 Chronic kidney disease, stage 4 (severe): Secondary | ICD-10-CM | POA: Diagnosis not present

## 2018-06-18 DIAGNOSIS — I160 Hypertensive urgency: Secondary | ICD-10-CM | POA: Diagnosis not present

## 2018-06-18 DIAGNOSIS — J449 Chronic obstructive pulmonary disease, unspecified: Secondary | ICD-10-CM | POA: Diagnosis not present

## 2018-06-18 DIAGNOSIS — I5033 Acute on chronic diastolic (congestive) heart failure: Secondary | ICD-10-CM | POA: Diagnosis not present

## 2018-06-18 DIAGNOSIS — I130 Hypertensive heart and chronic kidney disease with heart failure and stage 1 through stage 4 chronic kidney disease, or unspecified chronic kidney disease: Secondary | ICD-10-CM | POA: Diagnosis not present

## 2018-06-20 DIAGNOSIS — J449 Chronic obstructive pulmonary disease, unspecified: Secondary | ICD-10-CM | POA: Diagnosis not present

## 2018-06-20 DIAGNOSIS — I160 Hypertensive urgency: Secondary | ICD-10-CM | POA: Diagnosis not present

## 2018-06-20 DIAGNOSIS — E1122 Type 2 diabetes mellitus with diabetic chronic kidney disease: Secondary | ICD-10-CM | POA: Diagnosis not present

## 2018-06-20 DIAGNOSIS — I5033 Acute on chronic diastolic (congestive) heart failure: Secondary | ICD-10-CM | POA: Diagnosis not present

## 2018-06-20 DIAGNOSIS — I130 Hypertensive heart and chronic kidney disease with heart failure and stage 1 through stage 4 chronic kidney disease, or unspecified chronic kidney disease: Secondary | ICD-10-CM | POA: Diagnosis not present

## 2018-06-20 DIAGNOSIS — N184 Chronic kidney disease, stage 4 (severe): Secondary | ICD-10-CM | POA: Diagnosis not present

## 2018-06-22 ENCOUNTER — Encounter (HOSPITAL_COMMUNITY): Payer: Medicare Other

## 2018-06-26 DIAGNOSIS — I160 Hypertensive urgency: Secondary | ICD-10-CM | POA: Diagnosis not present

## 2018-06-26 DIAGNOSIS — I5033 Acute on chronic diastolic (congestive) heart failure: Secondary | ICD-10-CM | POA: Diagnosis not present

## 2018-06-26 DIAGNOSIS — N184 Chronic kidney disease, stage 4 (severe): Secondary | ICD-10-CM | POA: Diagnosis not present

## 2018-06-26 DIAGNOSIS — J449 Chronic obstructive pulmonary disease, unspecified: Secondary | ICD-10-CM | POA: Diagnosis not present

## 2018-06-26 DIAGNOSIS — E1122 Type 2 diabetes mellitus with diabetic chronic kidney disease: Secondary | ICD-10-CM | POA: Diagnosis not present

## 2018-06-26 DIAGNOSIS — I130 Hypertensive heart and chronic kidney disease with heart failure and stage 1 through stage 4 chronic kidney disease, or unspecified chronic kidney disease: Secondary | ICD-10-CM | POA: Diagnosis not present

## 2018-06-27 ENCOUNTER — Inpatient Hospital Stay (HOSPITAL_COMMUNITY)
Admission: RE | Admit: 2018-06-27 | Discharge: 2018-06-27 | Disposition: A | Discharge status: Home / Self Care | Payer: Medicare Other | Source: Ambulatory Visit | Attending: Nephrology | Admitting: Nephrology

## 2018-06-27 DIAGNOSIS — I160 Hypertensive urgency: Secondary | ICD-10-CM | POA: Diagnosis not present

## 2018-06-27 DIAGNOSIS — E1122 Type 2 diabetes mellitus with diabetic chronic kidney disease: Secondary | ICD-10-CM | POA: Diagnosis not present

## 2018-06-27 DIAGNOSIS — I130 Hypertensive heart and chronic kidney disease with heart failure and stage 1 through stage 4 chronic kidney disease, or unspecified chronic kidney disease: Secondary | ICD-10-CM | POA: Diagnosis not present

## 2018-06-27 DIAGNOSIS — J449 Chronic obstructive pulmonary disease, unspecified: Secondary | ICD-10-CM | POA: Diagnosis not present

## 2018-06-27 DIAGNOSIS — N184 Chronic kidney disease, stage 4 (severe): Secondary | ICD-10-CM | POA: Diagnosis not present

## 2018-06-27 DIAGNOSIS — I5033 Acute on chronic diastolic (congestive) heart failure: Secondary | ICD-10-CM | POA: Diagnosis not present

## 2018-07-03 ENCOUNTER — Encounter (HOSPITAL_COMMUNITY): Payer: Self-pay | Admitting: Emergency Medicine

## 2018-07-03 ENCOUNTER — Other Ambulatory Visit: Payer: Self-pay

## 2018-07-03 ENCOUNTER — Ambulatory Visit (HOSPITAL_COMMUNITY)
Admission: EM | Admit: 2018-07-03 | Discharge: 2018-07-03 | Disposition: A | Discharge status: Home / Self Care | Payer: Medicare Other

## 2018-07-03 DIAGNOSIS — M5431 Sciatica, right side: Secondary | ICD-10-CM | POA: Diagnosis not present

## 2018-07-03 MED ORDER — HYDROCODONE-ACETAMINOPHEN 5-325 MG PO TABS
1.0000 | ORAL_TABLET | Freq: Four times a day (QID) | ORAL | 0 refills | Status: DC | PRN
Start: 2018-07-03 — End: 2018-11-01

## 2018-07-03 MED ORDER — ACETAMINOPHEN 500 MG PO TABS: 500.0000 mg | ORAL_TABLET | Freq: Four times a day (QID) | ORAL | 0 refills | Status: DC | PRN

## 2018-07-03 MED ORDER — HYDROCODONE-ACETAMINOPHEN 5-325 MG PO TABS
1.0000 | ORAL_TABLET | Freq: Once | ORAL | Status: AC
  Administered 2018-07-03: 1 via ORAL

## 2018-07-03 MED ORDER — CYCLOBENZAPRINE HCL 10 MG PO TABS: 5.0000 mg | ORAL_TABLET | Freq: Two times a day (BID) | ORAL | 0 refills | Status: DC | PRN

## 2018-07-03 MED ORDER — HYDROCODONE-ACETAMINOPHEN 5-325 MG PO TABS
ORAL_TABLET | ORAL | Status: AC
  Filled 2018-07-03: qty 1

## 2018-07-03 NOTE — ED Triage Notes (Signed)
Pt reports right sided lower back pain that radiates into her right buttocks and down her right leg.  Pt is moaning and crying

## 2018-07-03 NOTE — ED Provider Notes (Signed)
[place]    CSN: [id number] Arrival date & time: 07/03/18  0820     History   Chief Complaint Chief Complaint  Patient presents with  . Back Pain    HPI Catherine Williams is a 57 y.o. female.   Patient is a 57 year old female with a past medical history of anemia, arthritis, gout, CHF, COPD, diabetes, hypertension, kidney disease, morbid obesity, PVD pinched nerve.  Catherine Williams presents today with 2 days of right lower back pain with radiation into right buttocks and right leg.  This is been constant and worsening.  Catherine Williams has some numbness and leg when lying flat.  Denies any injury to her back, heavy lifting.  Denies any fever, chills, dysuria, hematuria or loss of bowel or bladder control.  ROS per HPI      Past Medical History:  Diagnosis Date  . Anemia   . Arthritis Dec. 2014   Gout  . Asthma   . CHF (congestive heart failure) ([place])   . COPD (chronic obstructive pulmonary disease) ([place])   . Diabetes mellitus   . Heart failure   . Hyperlipidemia   . Hypertension   . Kidney disease   . Morbid obesity ([place])   . Peripheral vascular disease ([place])   . Pinched nerve   . Sleep apnea     Patient Active Problem List   Diagnosis Date Noted  . Hypertensive urgency 05/13/2018  . Fall 02/08/2018  . Chronic respiratory failure with hypoxia ([place]) 02/08/2018  . Neuropathy 10/31/2016  . Shortness of breath 10/23/2016  . Acute on chronic diastolic CHF (congestive heart failure) ([place]) 10/23/2016  . GERD (gastroesophageal reflux disease) 10/23/2016  . Severe obesity (BMI >= 40) ([place]) 08/28/2015  . Dyspnea 08/27/2015  . CKD (chronic kidney disease), stage IV ([place]) 07/17/2015  . Insulin-requiring or dependent type II diabetes mellitus ([place]) 07/17/2015  . Chronic anemia 07/17/2015  . Hypertension 07/17/2015  . Peripheral vascular disease, unspecified ([place]) 05/07/2012  . OSA (obstructive sleep apnea) 04/05/2011    Past Surgical History:  Procedure Laterality Date  .  Atherectomy and angioplasty  10/18/2011   left posterior tibial artery  . HAND SURGERY     right  . OVARY SURGERY     Left  . TOE AMPUTATION  Sept. 25,2012   Left 4th and 5th toes    OB History   None      Home Medications    Prior to Admission medications   Medication Sig Start Date End Date Taking? Authorizing Provider  amLODipine (NORVASC) 5 MG tablet Take 1 tablet (5 mg total) by mouth daily. 05/19/18  Yes Emokpae, Courage, MD  aspirin 81 MG tablet Take 81 mg by mouth daily.     Yes [provider]  carvedilol (COREG) 6.25 MG tablet Take 1 tablet (6.25 mg total) by mouth 2 (two) times daily with a meal. 05/18/18  Yes Emokpae, Courage, MD  Darbepoetin Alfa (ARANESP) 60 MCG/0.3ML SOSY injection Inject 0.3 mLs (60 mcg total) into the skin every Thursday at 6pm. Patient taking differently: Inject 60 mcg into the skin every 30 (thirty) days.  11/03/16  Yes Patrecia Pour, Christean Grief, MD  furosemide (LASIX) 80 MG tablet Take 1 tablet (80 mg)  in the morning, and 1/2 tablet (40 mg)  in the evening 05/18/18  Yes Emokpae, Courage, MD  insulin aspart (NOVOLOG) 100 UNIT/ML injection Inject 20 Units into the skin 3 (three) times daily with meals.    Yes [provider]  insulin  glargine (LANTUS) 100 UNIT/ML injection Inject 60 Units into the skin at bedtime.    Yes [provider]  isosorbide-hydrALAZINE (BIDIL) 20-37.5 MG tablet Take 2 tablets by mouth 3 (three) times daily. 05/18/18  Yes Emokpae, Courage, MD  loratadine (CLARITIN) 10 MG tablet Take 10 mg by mouth daily.   Yes [provider]  pantoprazole (PROTONIX) 40 MG tablet Take 1 tablet (40 mg total) by mouth daily. 07/21/15  Yes Barton Dubois, MD  rosuvastatin (CRESTOR) 40 MG tablet Take 40 mg by mouth every evening.    Yes [provider]  albuterol (PROVENTIL HFA;VENTOLIN HFA) 108 (90 BASE) MCG/ACT inhaler Inhale 1-2 puffs into the lungs every 6 (six) hours as needed for wheezing. 07/25/13   Harden Mo, MD  allopurinol (ZYLOPRIM) 100 MG tablet Take 1 tablet (100 mg total) by mouth at bedtime. Patient not taking: Reported on 05/13/2018 11/03/16   Patrecia Pour, Christean Grief, MD  ferrous sulfate 325 (65 FE) MG tablet Take 1 tablet (325 mg total) by mouth 2 (two) times daily with a meal. Patient not taking: Reported on 05/13/2018 11/03/16   Patrecia Pour, Christean Grief, MD  HYDROcodone-acetaminophen (NORCO/VICODIN) 5-325 MG tablet Take 1 tablet by mouth every 6 (six) hours as needed. 07/03/18   Orvan July, NP    Family History Family History  Problem Relation Age of Onset  . Lung cancer Mother   . Clotting disorder Mother   . Heart disease Mother   . Cancer Mother        Lung  . Deep vein thrombosis Mother   . Diabetes Mother   . Hypertension Mother   . Varicose Veins Mother   . Cancer Father   . Clotting disorder Sister   . Heart attack Sister   . Diabetes Sister   . Clotting disorder Brother   . Deep vein thrombosis Brother   . Diabetes Brother   . Heart disease Brother   . Heart disease Sister     Social History Social History   Tobacco Use  . Smoking status: Never Smoker  . Smokeless tobacco: Never Used  Substance Use Topics  . Alcohol use: No    Alcohol/week: 0.0 oz  . Drug use: Yes    Types: Marijuana    Comment: FORMER>>smoked weed from age 24 to 38>> quit at age 46      Allergies   Omnipaque [iohexol]   Review of Systems Review of Systems   Physical Exam Triage Vital Signs ED Triage Vitals  Enc Vitals Group     BP 07/03/18 0831 139/69     Pulse Rate 07/03/18 0831 79     Resp 07/03/18 0831 (!) 24     Temp 07/03/18 0831 98.7 F (37.1 C)     Temp Source 07/03/18 0831 Oral     SpO2 07/03/18 0831 97 %     Weight --      Height --      Head Circumference --      Peak Flow --      Pain Score 07/03/18 0833 10     Pain Loc --      Pain Edu? --      Excl. in [place]? --    No data found.  Updated Vital Signs BP 139/69 (BP Location: Right Arm)   Pulse 79    Temp 98.7 F (37.1 C) (Oral)   Resp (!) 24 Comment: patient crying  SpO2 97%   Visual Acuity Right Eye Distance:  Left Eye Distance:   Bilateral Distance:    Right Eye Near:   Left Eye Near:    Bilateral Near:     Physical Exam  Constitutional: Catherine Williams is oriented to person, place, and time. Catherine Williams appears well-developed. Catherine Williams appears distressed.  Obese  HENT:  Head: Normocephalic and atraumatic.  Neck: Normal range of motion.  Cardiovascular: Normal rate and regular rhythm.  Pulmonary/Chest: Effort normal and breath sounds normal.  Musculoskeletal:  Exam limited due to body habitus and patient's pain level.  Patient crying in room during exam.  Exam done in wheelchair.  Modified straight leg test done in wheelchair with severe pain to right leg and right lumbar area.  Tender to palpation paravertebral muscles of lumbar spine.  Tender to palpation of sciatic notch. No tenderness to lumbar spine.  No erythema, ecchymosis, deformity, swelling.   Neurological: Catherine Williams is alert and oriented to person, place, and time.  Skin: Skin is warm and dry. Capillary refill takes less than 2 seconds.  Psychiatric:  Patient crying     UC Treatments / Results  Labs (all labs ordered are listed, but only abnormal results are displayed) Labs Reviewed - No data to display  EKG None  Radiology No results found.  Procedures Procedures (including critical care time)  Medications Ordered in UC Medications  HYDROcodone-acetaminophen (NORCO/VICODIN) 5-325 MG per tablet 1 tablet (1 tablet Oral Given 07/03/18 0908)    Initial Impression / Assessment and Plan / UC Course  I have reviewed the triage vital signs and the nursing notes.  Pertinent labs & imaging results that were available during my care of the patient were reviewed by me and considered in my medical decision making (see chart for details).     Due to patient presentation and during physical exam and positive straight leg test most  likely sciatic nerve pain.  Catherine Williams has had this in the past. Norco given in clinic for pain.  Would like to prescribe prednisone or NSAIDs for pain and inflammation but due to patient stage IV, chronic kidney disease and history of diabetes will prescribe Norco for pain.  Catherine Williams will need to follow-up with PCP or  orthopedic if no improvement.  Warning signs and precautions given.    Final Clinical Impressions(s) / UC Diagnoses   Final diagnoses:  Sciatica of right side     Discharge Instructions     It was nice meeting you!!  I believe you have sciatic nerve pain.  I am going to giving you a pain pill in the clinic and send you home with a prescription for Norco for pain.  Please follow-up with PCP if no improvement. Based on your kidney disease I cannot give you any NSAIDs.  If you develop any increased weakness, or loss of bowel or bladder function please go to the ER.    ED Prescriptions    Medication Sig Dispense Auth. Provider   cyclobenzaprine (FLEXERIL) 10 MG tablet  (Status: Discontinued) Take 0.5 tablets (5 mg total) by mouth 2 (two) times daily as needed for muscle spasms. 20 tablet Gabbriella Presswood A, NP   acetaminophen (TYLENOL) 500 MG tablet  (Status: Discontinued) Take 1 tablet (500 mg total) by mouth every 6 (six) hours as needed. 30 tablet Kristen Bushway A, NP   HYDROcodone-acetaminophen (NORCO/VICODIN) 5-325 MG tablet Take 1 tablet by mouth every 6 (six) hours as needed. 10 tablet Loura Halt A, NP     Controlled Substance Prescriptions [place] Controlled Substance Registry consulted? Not Applicable  Orvan July, NP 07/03/18 1542

## 2018-07-03 NOTE — Discharge Instructions (Addendum)
It was nice meeting you!!  I believe you have sciatic nerve pain.  I am going to giving you a pain pill in the clinic and send you home with a prescription for Norco for pain.  Please follow-up with PCP if no improvement. Based on your kidney disease I cannot give you any NSAIDs.  If you develop any increased weakness, or loss of bowel or bladder function please go to the ER.

## 2018-07-04 ENCOUNTER — Ambulatory Visit (HOSPITAL_COMMUNITY)
Admission: RE | Admit: 2018-07-04 | Discharge: 2018-07-04 | Disposition: A | Discharge status: Home / Self Care | Payer: Medicare Other | Source: Ambulatory Visit | Attending: Nephrology | Admitting: Nephrology

## 2018-07-04 VITALS — BP 128/42 | HR 75 | Temp 98.7°F | Ht 62.0 in | Wt 320.0 lb

## 2018-07-04 DIAGNOSIS — N184 Chronic kidney disease, stage 4 (severe): Secondary | ICD-10-CM | POA: Diagnosis not present

## 2018-07-04 DIAGNOSIS — D631 Anemia in chronic kidney disease: Secondary | ICD-10-CM | POA: Diagnosis not present

## 2018-07-04 LAB — CBC WITH DIFFERENTIAL/PLATELET
Abs Immature Granulocytes: 0 10*3/uL (ref 0.0–0.1)
BASOS ABS: 0.1 10*3/uL (ref 0.0–0.1)
BASOS PCT: 1 %
EOS ABS: 0.1 10*3/uL (ref 0.0–0.7)
EOS PCT: 1 %
HCT: 30.5 % — ABNORMAL LOW (ref 36.0–46.0)
Hemoglobin: 8.7 g/dL — ABNORMAL LOW (ref 12.0–15.0)
Immature Granulocytes: 0 %
Lymphocytes Relative: 14 %
Lymphs Abs: 1.4 10*3/uL (ref 0.7–4.0)
MCH: 25.4 pg — AB (ref 26.0–34.0)
MCHC: 28.5 g/dL — AB (ref 30.0–36.0)
MCV: 89.2 fL (ref 78.0–100.0)
Monocytes Absolute: 0.6 10*3/uL (ref 0.1–1.0)
Monocytes Relative: 6 %
Neutro Abs: 7.6 10*3/uL (ref 1.7–7.7)
Neutrophils Relative %: 78 %
Platelets: 207 10*3/uL (ref 150–400)
RBC: 3.42 MIL/uL — AB (ref 3.87–5.11)
RDW: 15.4 % (ref 11.5–15.5)
WBC: 9.8 10*3/uL (ref 4.0–10.5)

## 2018-07-04 LAB — RENAL FUNCTION PANEL
ALBUMIN: 3 g/dL — AB (ref 3.5–5.0)
Anion gap: 13 (ref 5–15)
BUN: 76 mg/dL — ABNORMAL HIGH (ref 6–20)
CALCIUM: 10 mg/dL (ref 8.9–10.3)
CO2: 24 mmol/L (ref 22–32)
CREATININE: 5.31 mg/dL — AB (ref 0.44–1.00)
Chloride: 102 mmol/L (ref 98–111)
GFR calc Af Amer: 9 mL/min — ABNORMAL LOW (ref >60)
GFR, EST NON AFRICAN AMERICAN: 8 mL/min — AB (ref >60)
Glucose, Bld: 177 mg/dL — ABNORMAL HIGH (ref 70–99)
PHOSPHORUS: 4.6 mg/dL (ref 2.5–4.6)
POTASSIUM: 4.7 mmol/L (ref 3.5–5.1)
SODIUM: 139 mmol/L (ref 135–145)

## 2018-07-04 LAB — IRON AND TIBC
Iron: 24 ug/dL — ABNORMAL LOW (ref 28–170)
Saturation Ratios: 13 % (ref 10.4–31.8)
TIBC: 179 ug/dL — ABNORMAL LOW (ref 250–450)
UIBC: 155 ug/dL

## 2018-07-04 LAB — FERRITIN: Ferritin: 836 ng/mL — ABNORMAL HIGH (ref 11–307)

## 2018-07-04 MED ORDER — DARBEPOETIN ALFA 150 MCG/0.3ML IJ SOSY
120.0000 ug | PREFILLED_SYRINGE | INTRAMUSCULAR | Status: DC
  Administered 2018-07-04: 120 ug via SUBCUTANEOUS

## 2018-07-04 MED ORDER — DARBEPOETIN ALFA 150 MCG/0.3ML IJ SOSY
PREFILLED_SYRINGE | INTRAMUSCULAR | Status: AC
  Filled 2018-07-04: qty 0.3

## 2018-07-05 DIAGNOSIS — N184 Chronic kidney disease, stage 4 (severe): Secondary | ICD-10-CM | POA: Diagnosis not present

## 2018-07-05 DIAGNOSIS — I160 Hypertensive urgency: Secondary | ICD-10-CM | POA: Diagnosis not present

## 2018-07-05 DIAGNOSIS — R7989 Other specified abnormal findings of blood chemistry: Secondary | ICD-10-CM | POA: Diagnosis not present

## 2018-07-05 DIAGNOSIS — N2581 Secondary hyperparathyroidism of renal origin: Secondary | ICD-10-CM | POA: Diagnosis not present

## 2018-07-05 DIAGNOSIS — D509 Iron deficiency anemia, unspecified: Secondary | ICD-10-CM | POA: Diagnosis not present

## 2018-07-05 DIAGNOSIS — E1122 Type 2 diabetes mellitus with diabetic chronic kidney disease: Secondary | ICD-10-CM | POA: Diagnosis not present

## 2018-07-05 DIAGNOSIS — N179 Acute kidney failure, unspecified: Secondary | ICD-10-CM | POA: Diagnosis not present

## 2018-07-05 DIAGNOSIS — R809 Proteinuria, unspecified: Secondary | ICD-10-CM | POA: Diagnosis not present

## 2018-07-05 DIAGNOSIS — G4730 Sleep apnea, unspecified: Secondary | ICD-10-CM | POA: Diagnosis not present

## 2018-07-05 DIAGNOSIS — Z6841 Body Mass Index (BMI) 40.0 and over, adult: Secondary | ICD-10-CM | POA: Diagnosis not present

## 2018-07-05 DIAGNOSIS — J449 Chronic obstructive pulmonary disease, unspecified: Secondary | ICD-10-CM | POA: Diagnosis not present

## 2018-07-05 DIAGNOSIS — I129 Hypertensive chronic kidney disease with stage 1 through stage 4 chronic kidney disease, or unspecified chronic kidney disease: Secondary | ICD-10-CM | POA: Diagnosis not present

## 2018-07-05 DIAGNOSIS — I130 Hypertensive heart and chronic kidney disease with heart failure and stage 1 through stage 4 chronic kidney disease, or unspecified chronic kidney disease: Secondary | ICD-10-CM | POA: Diagnosis not present

## 2018-07-05 DIAGNOSIS — I5033 Acute on chronic diastolic (congestive) heart failure: Secondary | ICD-10-CM | POA: Diagnosis not present

## 2018-07-05 DIAGNOSIS — I509 Heart failure, unspecified: Secondary | ICD-10-CM | POA: Diagnosis not present

## 2018-07-06 DIAGNOSIS — N184 Chronic kidney disease, stage 4 (severe): Secondary | ICD-10-CM | POA: Diagnosis not present

## 2018-07-06 DIAGNOSIS — I130 Hypertensive heart and chronic kidney disease with heart failure and stage 1 through stage 4 chronic kidney disease, or unspecified chronic kidney disease: Secondary | ICD-10-CM | POA: Diagnosis not present

## 2018-07-06 DIAGNOSIS — I160 Hypertensive urgency: Secondary | ICD-10-CM | POA: Diagnosis not present

## 2018-07-06 DIAGNOSIS — E1122 Type 2 diabetes mellitus with diabetic chronic kidney disease: Secondary | ICD-10-CM | POA: Diagnosis not present

## 2018-07-06 DIAGNOSIS — J449 Chronic obstructive pulmonary disease, unspecified: Secondary | ICD-10-CM | POA: Diagnosis not present

## 2018-07-06 DIAGNOSIS — I5033 Acute on chronic diastolic (congestive) heart failure: Secondary | ICD-10-CM | POA: Diagnosis not present

## 2018-07-09 ENCOUNTER — Other Ambulatory Visit: Payer: Self-pay

## 2018-07-09 DIAGNOSIS — Z01812 Encounter for preprocedural laboratory examination: Secondary | ICD-10-CM

## 2018-07-09 DIAGNOSIS — N184 Chronic kidney disease, stage 4 (severe): Secondary | ICD-10-CM

## 2018-07-11 ENCOUNTER — Other Ambulatory Visit (HOSPITAL_COMMUNITY): Payer: Self-pay | Admitting: *Deleted

## 2018-07-12 ENCOUNTER — Ambulatory Visit (HOSPITAL_COMMUNITY)
Admission: RE | Admit: 2018-07-12 | Discharge: 2018-07-12 | Disposition: A | Discharge status: Home / Self Care | Payer: Medicare Other | Source: Ambulatory Visit | Attending: Nephrology | Admitting: Nephrology

## 2018-07-12 DIAGNOSIS — E1122 Type 2 diabetes mellitus with diabetic chronic kidney disease: Secondary | ICD-10-CM | POA: Diagnosis not present

## 2018-07-12 DIAGNOSIS — N189 Chronic kidney disease, unspecified: Secondary | ICD-10-CM | POA: Diagnosis not present

## 2018-07-12 DIAGNOSIS — I130 Hypertensive heart and chronic kidney disease with heart failure and stage 1 through stage 4 chronic kidney disease, or unspecified chronic kidney disease: Secondary | ICD-10-CM | POA: Diagnosis not present

## 2018-07-12 DIAGNOSIS — D631 Anemia in chronic kidney disease: Secondary | ICD-10-CM | POA: Insufficient documentation

## 2018-07-12 DIAGNOSIS — N184 Chronic kidney disease, stage 4 (severe): Secondary | ICD-10-CM | POA: Diagnosis not present

## 2018-07-12 DIAGNOSIS — J449 Chronic obstructive pulmonary disease, unspecified: Secondary | ICD-10-CM | POA: Diagnosis not present

## 2018-07-12 DIAGNOSIS — I5033 Acute on chronic diastolic (congestive) heart failure: Secondary | ICD-10-CM | POA: Diagnosis not present

## 2018-07-12 DIAGNOSIS — I160 Hypertensive urgency: Secondary | ICD-10-CM | POA: Diagnosis not present

## 2018-07-12 MED ORDER — SODIUM CHLORIDE 0.9 % IV SOLN
510.0000 mg | INTRAVENOUS | Status: DC
  Administered 2018-07-12: 510 mg via INTRAVENOUS
  Filled 2018-07-12: qty 17

## 2018-07-13 DIAGNOSIS — I160 Hypertensive urgency: Secondary | ICD-10-CM | POA: Diagnosis not present

## 2018-07-13 DIAGNOSIS — E1122 Type 2 diabetes mellitus with diabetic chronic kidney disease: Secondary | ICD-10-CM | POA: Diagnosis not present

## 2018-07-13 DIAGNOSIS — I5033 Acute on chronic diastolic (congestive) heart failure: Secondary | ICD-10-CM | POA: Diagnosis not present

## 2018-07-13 DIAGNOSIS — I130 Hypertensive heart and chronic kidney disease with heart failure and stage 1 through stage 4 chronic kidney disease, or unspecified chronic kidney disease: Secondary | ICD-10-CM | POA: Diagnosis not present

## 2018-07-13 DIAGNOSIS — N184 Chronic kidney disease, stage 4 (severe): Secondary | ICD-10-CM | POA: Diagnosis not present

## 2018-07-13 DIAGNOSIS — J449 Chronic obstructive pulmonary disease, unspecified: Secondary | ICD-10-CM | POA: Diagnosis not present

## 2018-07-16 ENCOUNTER — Ambulatory Visit (INDEPENDENT_AMBULATORY_CARE_PROVIDER_SITE_OTHER): Payer: Medicare Other

## 2018-07-16 ENCOUNTER — Ambulatory Visit (INDEPENDENT_AMBULATORY_CARE_PROVIDER_SITE_OTHER): Payer: Medicare Other | Admitting: Specialist

## 2018-07-16 ENCOUNTER — Encounter (INDEPENDENT_AMBULATORY_CARE_PROVIDER_SITE_OTHER): Payer: Self-pay | Admitting: Specialist

## 2018-07-16 VITALS — BP 176/82 | HR 83 | Ht 61.0 in | Wt 305.0 lb

## 2018-07-16 DIAGNOSIS — G8929 Other chronic pain: Secondary | ICD-10-CM

## 2018-07-16 DIAGNOSIS — M5441 Lumbago with sciatica, right side: Secondary | ICD-10-CM

## 2018-07-16 DIAGNOSIS — M7061 Trochanteric bursitis, right hip: Secondary | ICD-10-CM

## 2018-07-16 DIAGNOSIS — M5136 Other intervertebral disc degeneration, lumbar region: Secondary | ICD-10-CM | POA: Diagnosis not present

## 2018-07-16 DIAGNOSIS — N184 Chronic kidney disease, stage 4 (severe): Secondary | ICD-10-CM

## 2018-07-16 DIAGNOSIS — M533 Sacrococcygeal disorders, not elsewhere classified: Secondary | ICD-10-CM

## 2018-07-16 MED ORDER — HYDROCODONE-ACETAMINOPHEN 5-325 MG PO TABS: 1.0000 | ORAL_TABLET | Freq: Four times a day (QID) | ORAL | 0 refills | Status: DC | PRN

## 2018-07-16 MED ORDER — ALLOPURINOL 100 MG PO TABS: 100.0000 mg | ORAL_TABLET | Freq: Every day | ORAL | 6 refills | Status: DC

## 2018-07-16 NOTE — Patient Instructions (Addendum)
Avoid frequent bending and stooping  No lifting greater than 10 lbs. May use ice or moist heat for pain. Weight loss is of benefit. Handicap license is approved.  You have severe kidney disease and you can not take aspirin or NSAID like medications, no Motrin no alleve even tylenol is of concern.  Based on this you are appropriate to take an occasional narcotic medication. Part of the cause of your joint and back pain is gout and you need to remain on allopurinol For the rest of your life.

## 2018-07-16 NOTE — Progress Notes (Addendum)
Office Visit Note   Patient: Catherine Williams           Date of Birth: April 16, 1961           MRN: [id number] Visit Date: 07/16/2018              Requested by: Vonna Drafts, [place], [place] [id number] PCP: Vonna Drafts, FNP   Assessment & Plan: Visit Diagnoses:  1. Chronic right-sided low back pain with right-sided sciatica   2. Degenerative disc disease, lumbar   3. Sacroiliac joint disease   4. Greater trochanteric bursitis of right hip    57 year old female with history of diabetes now insulin dependent. She is experiencing increasing back and right leg pain with claudication like symptoms. Previous MRI with mild foramenal narrowing right L4-5. She has worsening renal disease and now with recent Cr 5. Will check for  Increased uric acid due to renal disease and renew her allopurinol. Due to diabetes I am reluctant to start steroid. I think her disease is mild and not severe enough to warrant surgery. ESI may help but will try PT, check uric acid for elevation,last tested 04/2018 was elevated. Has been out of allopurinol.    Plan: Avoid frequent bending and stooping  No lifting greater than 10 lbs. May use ice or moist heat for pain. Weight loss is of benefit. Handicap license is approved. You have severe kidney disease and you can not take aspirin or NSAID like medications, no Motrin no alleve even tylenol is of concern.  Based on this you are appropriate to take an occasional narcotic medication. Part of the cause of your joint and back pain is gout and you need to remain on allopurinol For the rest of your life.   Follow-Up Instructions: Return in about 1 month (around 08/13/2018).   Orders:  Orders Placed This Encounter  Procedures  . XR Lumbar Spine 2-3 Views   No orders of the defined types were placed in this encounter.     Procedures: No procedures performed   Clinical Data: Findings:  MRI from 2016 shows minimal disc  dessication, Left extraforamenal lateral disc protrusion with L3 impression, bilateral mild foramenal narrowing L4-5 left greater than right. No significant  Central canal stenosis or central protrusion. Only right sided findings is mild neuroforamenal narrowing right L4.     Subjective: Chief Complaint  Patient presents with  . Lower Back - Pain    57 year old female with history of partial foot resection for gangrene changes in 2012. She did well and eventually went on to heal this area. Dr. Meredeth Ide is her nephrologist. She  Has had progressive diabetes and now in taking insulin. She last saw Dr. Mahalia Longest several years ago. She is experiencing back pain that radiates into the the legs with standing and  Walking. Her physical therapist is concerned that her buttock pain and leg pain is from her back. She is now in a first floor apartment and able to exercise more frequently. She uses a  Environmental consultant with a seat. Able to walk from her building at least 200 meters. She tends to stoop with walking. The pain worsens with straightening the back. No bowel or bladder difficulty, constipation. She takes hydrocodone for pain and muscle relaxers. She went to the ER due to pain and the ER referred her to [place]. She takes ES tylenol but it hardly touches the pain.    Review of  Systems  Constitutional: Positive for unexpected weight change. Negative for activity change, appetite change, chills, diaphoresis, fatigue and fever.  HENT: Positive for ear pain, rhinorrhea, sinus pressure and sinus pain. Negative for congestion, dental problem, drooling, ear discharge, facial swelling, hearing loss, mouth sores, nosebleeds, postnasal drip, sneezing, sore throat, tinnitus, trouble swallowing and voice change.   Eyes: Positive for discharge and itching. Negative for photophobia, pain, redness and visual disturbance.  Respiratory: Positive for apnea. Negative for cough, choking, chest tightness, shortness of breath,  wheezing and stridor.   Cardiovascular: Positive for leg swelling. Negative for chest pain and palpitations.  Gastrointestinal: Positive for constipation. Negative for abdominal distention, abdominal pain, anal bleeding, blood in stool, diarrhea and nausea.  Endocrine: Positive for cold intolerance. Negative for heat intolerance, polydipsia, polyphagia and polyuria.  Genitourinary: Negative.  Negative for difficulty urinating, dyspareunia, dysuria, enuresis, flank pain, frequency, hematuria, pelvic pain and urgency.  Musculoskeletal: Positive for arthralgias, back pain and gait problem. Negative for joint swelling, myalgias, neck pain and neck stiffness.  Skin: Negative.  Negative for color change, pallor, rash and wound.  Allergic/Immunologic: Positive for environmental allergies. Negative for food allergies and immunocompromised state.  Neurological: Positive for weakness and numbness. Negative for dizziness, tremors, seizures, syncope, facial asymmetry, speech difficulty, light-headedness and headaches.  Hematological: Negative.  Negative for adenopathy. Does not bruise/bleed easily.  Psychiatric/Behavioral: Negative.  Negative for agitation, behavioral problems, confusion, decreased concentration, dysphoric mood, hallucinations, self-injury, sleep disturbance and suicidal ideas. The patient is not nervous/anxious and is not hyperactive.      Objective: Vital Signs: BP (!) 176/82 (BP Location: Left Arm, Patient Position: Sitting, Cuff Size: Large) Comment (BP Location): forearm  Pulse 83   Ht 5\' 1"  (1.549 m)   Wt (!) 305 lb (138.3 kg)   BMI 57.63 kg/m   Physical Exam  Constitutional: She is oriented to person, place, and time. She appears well-developed and well-nourished.  HENT:  Head: Normocephalic and atraumatic.  Eyes: Pupils are equal, round, and reactive to light. EOM are normal.  Neck: Normal range of motion. Neck supple.  Pulmonary/Chest: Effort normal and breath sounds normal.   Abdominal: Soft. Bowel sounds are normal.  Neurological: She is alert and oriented to person, place, and time.  Skin: Skin is warm and dry.  Psychiatric: She has a normal mood and affect. Her behavior is normal. Judgment and thought content normal.    Back Exam   Tenderness  The patient is experiencing tenderness in the lumbar.  Range of Motion  Extension: normal  Flexion: abnormal  Lateral bend right: abnormal  Lateral bend left: abnormal  Rotation right: abnormal  Rotation left: abnormal   Muscle Strength  Right Quadriceps:  5/5  Left Quadriceps:  5/5  Right Hamstrings:  5/5  Left Hamstrings:  5/5   Tests  Straight leg raise right: negative Straight leg raise left: negative  Reflexes  Patellar: Hyporeflexic Achilles: Hyporeflexic Biceps: Hyporeflexic Babinski's sign: normal   Other  Toe walk: abnormal Heel walk: abnormal Sensation: decreased Gait: antalgic   Comments:  Motor is intact both lower extremities.       Specialty Comments:  No specialty comments available.  Imaging: Xr Lumbar Spine 2-3 Views  Result Date: 07/16/2018 Lumbar AP and lateral flexion and extension radiographs show the disc heights are well maintained no anterolisthesis. Bilateral SI joint narrowing and sclerosis is present. Hip joint lines are also mildly narrowed.     PMFS History: Patient Active Problem List   Diagnosis Date Noted  .  Hypertensive urgency 05/13/2018  . Fall 02/08/2018  . Chronic respiratory failure with hypoxia ([place]) 02/08/2018  . Neuropathy 10/31/2016  . Shortness of breath 10/23/2016  . Acute on chronic diastolic CHF (congestive heart failure) ([place]) 10/23/2016  . GERD (gastroesophageal reflux disease) 10/23/2016  . Severe obesity (BMI >= 40) ([place]) 08/28/2015  . Dyspnea 08/27/2015  . CKD (chronic kidney disease), stage IV ([place]) 07/17/2015  . Insulin-requiring or dependent type II diabetes mellitus ([place]) 07/17/2015  . Chronic anemia 07/17/2015  .  Hypertension 07/17/2015  . Peripheral vascular disease, unspecified ([place]) 05/07/2012  . OSA (obstructive sleep apnea) 04/05/2011   Past Medical History:  Diagnosis Date  . Anemia   . Arthritis Dec. 2014   Gout  . Asthma   . CHF (congestive heart failure) ([place])   . COPD (chronic obstructive pulmonary disease) ([place])   . Diabetes mellitus   . Heart failure   . Hyperlipidemia   . Hypertension   . Kidney disease   . Morbid obesity ([place])   . Peripheral vascular disease ([place])   . Pinched nerve   . Sleep apnea     Family History  Problem Relation Age of Onset  . Lung cancer Mother   . Clotting disorder Mother   . Heart disease Mother   . Cancer Mother        Lung  . Deep vein thrombosis Mother   . Diabetes Mother   . Hypertension Mother   . Varicose Veins Mother   . Cancer Father   . Clotting disorder Sister   . Heart attack Sister   . Diabetes Sister   . Clotting disorder Brother   . Deep vein thrombosis Brother   . Diabetes Brother   . Heart disease Brother   . Heart disease Sister     Past Surgical History:  Procedure Laterality Date  . Atherectomy and angioplasty  10/18/2011   left posterior tibial artery  . HAND SURGERY     right  . OVARY SURGERY     Left  . TOE AMPUTATION  Sept. 25,2012   Left 4th and 5th toes   Social History   Occupational History  . 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  Tobacco Use  . Smoking status: Never Smoker  . Smokeless tobacco: Never Used  Substance and Sexual Activity  . Alcohol use: No    Alcohol/week: 0.0 oz  . Drug use: Yes    Types: Marijuana    Comment: FORMER>>smoked weed from age 49 to 38>> quit at age 22   . Sexual activity: Not on file

## 2018-07-17 DIAGNOSIS — N184 Chronic kidney disease, stage 4 (severe): Secondary | ICD-10-CM | POA: Diagnosis not present

## 2018-07-17 DIAGNOSIS — I10 Essential (primary) hypertension: Secondary | ICD-10-CM | POA: Diagnosis not present

## 2018-07-17 DIAGNOSIS — E1122 Type 2 diabetes mellitus with diabetic chronic kidney disease: Secondary | ICD-10-CM | POA: Diagnosis not present

## 2018-07-17 DIAGNOSIS — I5033 Acute on chronic diastolic (congestive) heart failure: Secondary | ICD-10-CM | POA: Diagnosis not present

## 2018-07-17 DIAGNOSIS — I160 Hypertensive urgency: Secondary | ICD-10-CM | POA: Diagnosis not present

## 2018-07-17 DIAGNOSIS — I130 Hypertensive heart and chronic kidney disease with heart failure and stage 1 through stage 4 chronic kidney disease, or unspecified chronic kidney disease: Secondary | ICD-10-CM | POA: Diagnosis not present

## 2018-07-17 DIAGNOSIS — J449 Chronic obstructive pulmonary disease, unspecified: Secondary | ICD-10-CM | POA: Diagnosis not present

## 2018-07-17 DIAGNOSIS — N183 Chronic kidney disease, stage 3 (moderate): Secondary | ICD-10-CM | POA: Diagnosis not present

## 2018-07-17 LAB — URIC ACID: Uric Acid, Serum: 10.9 mg/dL — ABNORMAL HIGH (ref 2.5–7.0)

## 2018-07-18 DIAGNOSIS — I160 Hypertensive urgency: Secondary | ICD-10-CM | POA: Diagnosis not present

## 2018-07-18 DIAGNOSIS — J449 Chronic obstructive pulmonary disease, unspecified: Secondary | ICD-10-CM | POA: Diagnosis not present

## 2018-07-18 DIAGNOSIS — Z79899 Other long term (current) drug therapy: Secondary | ICD-10-CM | POA: Diagnosis not present

## 2018-07-18 DIAGNOSIS — I5033 Acute on chronic diastolic (congestive) heart failure: Secondary | ICD-10-CM | POA: Diagnosis not present

## 2018-07-18 DIAGNOSIS — E669 Obesity, unspecified: Secondary | ICD-10-CM | POA: Diagnosis not present

## 2018-07-18 DIAGNOSIS — N184 Chronic kidney disease, stage 4 (severe): Secondary | ICD-10-CM | POA: Diagnosis not present

## 2018-07-18 DIAGNOSIS — N183 Chronic kidney disease, stage 3 (moderate): Secondary | ICD-10-CM | POA: Diagnosis not present

## 2018-07-18 DIAGNOSIS — E1122 Type 2 diabetes mellitus with diabetic chronic kidney disease: Secondary | ICD-10-CM | POA: Diagnosis not present

## 2018-07-18 DIAGNOSIS — I10 Essential (primary) hypertension: Secondary | ICD-10-CM | POA: Diagnosis not present

## 2018-07-18 DIAGNOSIS — I130 Hypertensive heart and chronic kidney disease with heart failure and stage 1 through stage 4 chronic kidney disease, or unspecified chronic kidney disease: Secondary | ICD-10-CM | POA: Diagnosis not present

## 2018-07-19 ENCOUNTER — Ambulatory Visit (HOSPITAL_COMMUNITY)
Admission: RE | Admit: 2018-07-19 | Discharge: 2018-07-19 | Disposition: A | Discharge status: Home / Self Care | Payer: Medicare Other | Source: Ambulatory Visit | Attending: Nephrology | Admitting: Nephrology

## 2018-07-19 ENCOUNTER — Encounter (HOSPITAL_COMMUNITY): Payer: Medicare Other

## 2018-07-19 ENCOUNTER — Other Ambulatory Visit (HOSPITAL_COMMUNITY): Payer: Medicare Other

## 2018-07-19 DIAGNOSIS — N189 Chronic kidney disease, unspecified: Secondary | ICD-10-CM | POA: Insufficient documentation

## 2018-07-19 DIAGNOSIS — D631 Anemia in chronic kidney disease: Secondary | ICD-10-CM | POA: Diagnosis not present

## 2018-07-19 MED ORDER — SODIUM CHLORIDE 0.9 % IV SOLN
510.0000 mg | INTRAVENOUS | Status: DC
  Administered 2018-07-19: 510 mg via INTRAVENOUS
  Filled 2018-07-19: qty 17

## 2018-07-20 ENCOUNTER — Encounter (HOSPITAL_COMMUNITY): Payer: Medicare Other

## 2018-07-20 ENCOUNTER — Encounter: Payer: Medicare Other | Admitting: Vascular Surgery

## 2018-07-25 ENCOUNTER — Encounter (HOSPITAL_COMMUNITY): Payer: Medicare Other

## 2018-08-01 ENCOUNTER — Ambulatory Visit (HOSPITAL_COMMUNITY)
Admission: RE | Admit: 2018-08-01 | Discharge: 2018-08-01 | Disposition: A | Discharge status: Home / Self Care | Payer: Medicare Other | Source: Ambulatory Visit | Attending: Nephrology | Admitting: Nephrology

## 2018-08-01 VITALS — BP 132/53 | HR 80 | Resp 16

## 2018-08-01 DIAGNOSIS — N183 Chronic kidney disease, stage 3 (moderate): Secondary | ICD-10-CM | POA: Insufficient documentation

## 2018-08-01 DIAGNOSIS — D631 Anemia in chronic kidney disease: Secondary | ICD-10-CM | POA: Diagnosis not present

## 2018-08-01 DIAGNOSIS — N184 Chronic kidney disease, stage 4 (severe): Secondary | ICD-10-CM

## 2018-08-01 LAB — CBC WITH DIFFERENTIAL/PLATELET
Abs Immature Granulocytes: 0 10*3/uL (ref 0.0–0.1)
Basophils Absolute: 0 10*3/uL (ref 0.0–0.1)
Basophils Relative: 1 %
EOS ABS: 0.2 10*3/uL (ref 0.0–0.7)
Eosinophils Relative: 2 %
HEMATOCRIT: 34.2 % — AB (ref 36.0–46.0)
HEMOGLOBIN: 9.8 g/dL — AB (ref 12.0–15.0)
IMMATURE GRANULOCYTES: 0 %
LYMPHS ABS: 1.7 10*3/uL (ref 0.7–4.0)
LYMPHS PCT: 20 %
MCH: 25.3 pg — ABNORMAL LOW (ref 26.0–34.0)
MCHC: 28.7 g/dL — ABNORMAL LOW (ref 30.0–36.0)
MCV: 88.4 fL (ref 78.0–100.0)
MONOS PCT: 6 %
Monocytes Absolute: 0.5 10*3/uL (ref 0.1–1.0)
NEUTROS PCT: 71 %
Neutro Abs: 6.1 10*3/uL (ref 1.7–7.7)
Platelets: 268 10*3/uL (ref 150–400)
RBC: 3.87 MIL/uL (ref 3.87–5.11)
RDW: 15.6 % — AB (ref 11.5–15.5)
WBC: 8.5 10*3/uL (ref 4.0–10.5)

## 2018-08-01 LAB — RENAL FUNCTION PANEL
Albumin: 3.2 g/dL — ABNORMAL LOW (ref 3.5–5.0)
Anion gap: 12 (ref 5–15)
BUN: 80 mg/dL — ABNORMAL HIGH (ref 6–20)
CHLORIDE: 104 mmol/L (ref 98–111)
CO2: 24 mmol/L (ref 22–32)
CREATININE: 4.62 mg/dL — AB (ref 0.44–1.00)
Calcium: 10.1 mg/dL (ref 8.9–10.3)
GFR calc non Af Amer: 9 mL/min — ABNORMAL LOW (ref >60)
GFR, EST AFRICAN AMERICAN: 11 mL/min — AB (ref >60)
Glucose, Bld: 177 mg/dL — ABNORMAL HIGH (ref 70–99)
POTASSIUM: 4.6 mmol/L (ref 3.5–5.1)
Phosphorus: 4.4 mg/dL (ref 2.5–4.6)
Sodium: 140 mmol/L (ref 135–145)

## 2018-08-01 MED ORDER — DARBEPOETIN ALFA 150 MCG/0.3ML IJ SOSY
120.0000 ug | PREFILLED_SYRINGE | INTRAMUSCULAR | Status: DC
  Administered 2018-08-01: 120 ug via SUBCUTANEOUS

## 2018-08-01 MED ORDER — DARBEPOETIN ALFA 150 MCG/0.3ML IJ SOSY
PREFILLED_SYRINGE | INTRAMUSCULAR | Status: AC
  Administered 2018-08-01: 120 ug via SUBCUTANEOUS
  Filled 2018-08-01: qty 0.3

## 2018-08-02 LAB — PTH, INTACT AND CALCIUM
Calcium, Total (PTH): 9.7 mg/dL (ref 8.7–10.2)
PTH: 388 pg/mL — ABNORMAL HIGH (ref 15–65)

## 2018-08-08 ENCOUNTER — Telehealth (INDEPENDENT_AMBULATORY_CARE_PROVIDER_SITE_OTHER): Payer: Self-pay | Admitting: Specialist

## 2018-08-08 DIAGNOSIS — J449 Chronic obstructive pulmonary disease, unspecified: Secondary | ICD-10-CM | POA: Diagnosis not present

## 2018-08-08 DIAGNOSIS — E1122 Type 2 diabetes mellitus with diabetic chronic kidney disease: Secondary | ICD-10-CM | POA: Diagnosis not present

## 2018-08-08 DIAGNOSIS — M1991 Primary osteoarthritis, unspecified site: Secondary | ICD-10-CM | POA: Diagnosis not present

## 2018-08-08 DIAGNOSIS — E1140 Type 2 diabetes mellitus with diabetic neuropathy, unspecified: Secondary | ICD-10-CM | POA: Diagnosis not present

## 2018-08-08 DIAGNOSIS — E1151 Type 2 diabetes mellitus with diabetic peripheral angiopathy without gangrene: Secondary | ICD-10-CM | POA: Diagnosis not present

## 2018-08-08 DIAGNOSIS — N184 Chronic kidney disease, stage 4 (severe): Secondary | ICD-10-CM | POA: Diagnosis not present

## 2018-08-08 DIAGNOSIS — I130 Hypertensive heart and chronic kidney disease with heart failure and stage 1 through stage 4 chronic kidney disease, or unspecified chronic kidney disease: Secondary | ICD-10-CM | POA: Diagnosis not present

## 2018-08-08 DIAGNOSIS — I5033 Acute on chronic diastolic (congestive) heart failure: Secondary | ICD-10-CM | POA: Diagnosis not present

## 2018-08-08 DIAGNOSIS — G4733 Obstructive sleep apnea (adult) (pediatric): Secondary | ICD-10-CM | POA: Diagnosis not present

## 2018-08-08 DIAGNOSIS — M7061 Trochanteric bursitis, right hip: Secondary | ICD-10-CM | POA: Diagnosis not present

## 2018-08-08 DIAGNOSIS — M5136 Other intervertebral disc degeneration, lumbar region: Secondary | ICD-10-CM | POA: Diagnosis not present

## 2018-08-08 DIAGNOSIS — M109 Gout, unspecified: Secondary | ICD-10-CM | POA: Diagnosis not present

## 2018-08-08 DIAGNOSIS — Z89422 Acquired absence of other left toe(s): Secondary | ICD-10-CM | POA: Diagnosis not present

## 2018-08-08 NOTE — Telephone Encounter (Signed)
Kate White-(PT) from [place] at [facility] called needing verbal orders for HHPT 1 time this week, and 2 wk 4   The number to contact Anda Kraft is [phone]

## 2018-08-09 NOTE — Telephone Encounter (Signed)
IC verbal given.  

## 2018-08-13 DIAGNOSIS — E1151 Type 2 diabetes mellitus with diabetic peripheral angiopathy without gangrene: Secondary | ICD-10-CM | POA: Diagnosis not present

## 2018-08-13 DIAGNOSIS — E1140 Type 2 diabetes mellitus with diabetic neuropathy, unspecified: Secondary | ICD-10-CM | POA: Diagnosis not present

## 2018-08-13 DIAGNOSIS — I130 Hypertensive heart and chronic kidney disease with heart failure and stage 1 through stage 4 chronic kidney disease, or unspecified chronic kidney disease: Secondary | ICD-10-CM | POA: Diagnosis not present

## 2018-08-13 DIAGNOSIS — J449 Chronic obstructive pulmonary disease, unspecified: Secondary | ICD-10-CM | POA: Diagnosis not present

## 2018-08-13 DIAGNOSIS — I5033 Acute on chronic diastolic (congestive) heart failure: Secondary | ICD-10-CM | POA: Diagnosis not present

## 2018-08-13 DIAGNOSIS — M5136 Other intervertebral disc degeneration, lumbar region: Secondary | ICD-10-CM | POA: Diagnosis not present

## 2018-08-15 DIAGNOSIS — E1151 Type 2 diabetes mellitus with diabetic peripheral angiopathy without gangrene: Secondary | ICD-10-CM | POA: Diagnosis not present

## 2018-08-15 DIAGNOSIS — I130 Hypertensive heart and chronic kidney disease with heart failure and stage 1 through stage 4 chronic kidney disease, or unspecified chronic kidney disease: Secondary | ICD-10-CM | POA: Diagnosis not present

## 2018-08-15 DIAGNOSIS — M5136 Other intervertebral disc degeneration, lumbar region: Secondary | ICD-10-CM | POA: Diagnosis not present

## 2018-08-15 DIAGNOSIS — J449 Chronic obstructive pulmonary disease, unspecified: Secondary | ICD-10-CM | POA: Diagnosis not present

## 2018-08-15 DIAGNOSIS — E1140 Type 2 diabetes mellitus with diabetic neuropathy, unspecified: Secondary | ICD-10-CM | POA: Diagnosis not present

## 2018-08-15 DIAGNOSIS — I5033 Acute on chronic diastolic (congestive) heart failure: Secondary | ICD-10-CM | POA: Diagnosis not present

## 2018-08-16 DIAGNOSIS — I129 Hypertensive chronic kidney disease with stage 1 through stage 4 chronic kidney disease, or unspecified chronic kidney disease: Secondary | ICD-10-CM | POA: Diagnosis not present

## 2018-08-16 DIAGNOSIS — R809 Proteinuria, unspecified: Secondary | ICD-10-CM | POA: Diagnosis not present

## 2018-08-16 DIAGNOSIS — I509 Heart failure, unspecified: Secondary | ICD-10-CM | POA: Diagnosis not present

## 2018-08-16 DIAGNOSIS — G4730 Sleep apnea, unspecified: Secondary | ICD-10-CM | POA: Diagnosis not present

## 2018-08-16 DIAGNOSIS — Z6841 Body Mass Index (BMI) 40.0 and over, adult: Secondary | ICD-10-CM | POA: Diagnosis not present

## 2018-08-16 DIAGNOSIS — R7989 Other specified abnormal findings of blood chemistry: Secondary | ICD-10-CM | POA: Diagnosis not present

## 2018-08-16 DIAGNOSIS — D509 Iron deficiency anemia, unspecified: Secondary | ICD-10-CM | POA: Diagnosis not present

## 2018-08-16 DIAGNOSIS — E1122 Type 2 diabetes mellitus with diabetic chronic kidney disease: Secondary | ICD-10-CM | POA: Diagnosis not present

## 2018-08-16 DIAGNOSIS — N184 Chronic kidney disease, stage 4 (severe): Secondary | ICD-10-CM | POA: Diagnosis not present

## 2018-08-16 DIAGNOSIS — N179 Acute kidney failure, unspecified: Secondary | ICD-10-CM | POA: Diagnosis not present

## 2018-08-16 DIAGNOSIS — N2581 Secondary hyperparathyroidism of renal origin: Secondary | ICD-10-CM | POA: Diagnosis not present

## 2018-08-22 ENCOUNTER — Encounter (INDEPENDENT_AMBULATORY_CARE_PROVIDER_SITE_OTHER): Payer: Self-pay | Admitting: Specialist

## 2018-08-22 ENCOUNTER — Ambulatory Visit (INDEPENDENT_AMBULATORY_CARE_PROVIDER_SITE_OTHER): Payer: Medicare Other | Admitting: Specialist

## 2018-08-22 VITALS — BP 149/63 | HR 83 | Ht 61.0 in | Wt 305.0 lb

## 2018-08-22 DIAGNOSIS — M5136 Other intervertebral disc degeneration, lumbar region: Secondary | ICD-10-CM | POA: Diagnosis not present

## 2018-08-22 DIAGNOSIS — E1140 Type 2 diabetes mellitus with diabetic neuropathy, unspecified: Secondary | ICD-10-CM

## 2018-08-22 DIAGNOSIS — E1151 Type 2 diabetes mellitus with diabetic peripheral angiopathy without gangrene: Secondary | ICD-10-CM | POA: Diagnosis not present

## 2018-08-22 DIAGNOSIS — E1122 Type 2 diabetes mellitus with diabetic chronic kidney disease: Secondary | ICD-10-CM | POA: Diagnosis not present

## 2018-08-22 DIAGNOSIS — Z6841 Body Mass Index (BMI) 40.0 and over, adult: Secondary | ICD-10-CM

## 2018-08-22 DIAGNOSIS — I130 Hypertensive heart and chronic kidney disease with heart failure and stage 1 through stage 4 chronic kidney disease, or unspecified chronic kidney disease: Secondary | ICD-10-CM | POA: Diagnosis not present

## 2018-08-22 DIAGNOSIS — I5033 Acute on chronic diastolic (congestive) heart failure: Secondary | ICD-10-CM | POA: Diagnosis not present

## 2018-08-22 DIAGNOSIS — N184 Chronic kidney disease, stage 4 (severe): Secondary | ICD-10-CM | POA: Diagnosis not present

## 2018-08-22 DIAGNOSIS — J449 Chronic obstructive pulmonary disease, unspecified: Secondary | ICD-10-CM | POA: Diagnosis not present

## 2018-08-22 DIAGNOSIS — M48062 Spinal stenosis, lumbar region with neurogenic claudication: Secondary | ICD-10-CM

## 2018-08-22 DIAGNOSIS — Z794 Long term (current) use of insulin: Secondary | ICD-10-CM

## 2018-08-22 NOTE — Progress Notes (Addendum)
Office Visit Note   Patient: Catherine Williams           Date of Birth: 12/13/1961           MRN: [id number] Visit Date: 08/22/2018              Requested by: Vonna Drafts, [place], [place] [address] PCP: Vonna Drafts, FNP   Assessment & Plan: Visit Diagnoses:  1. Spinal stenosis, lumbar region with neurogenic claudication   2. Morbid (severe) obesity due to excess calories ([place])   3. Body mass index 50.0-59.9, adult ([place])   4. Controlled type 2 diabetes mellitus with stage 4 chronic kidney disease, with long-term current use of insulin ([place])   5. Controlled type 2 diabetes with neuropathy ([place])     Plan: Avoid bending, stooping and avoid lifting weights greater than 10 lbs. Avoid prolong standing and walking. Avoid frequent bending and stooping  No lifting greater than 10 lbs. May use ice or moist heat for pain. Weight loss is of benefit. Handicap license is approved. Diabetic shoe with insert prescription. Flexion exercises of the lumbar spine and use of a stationary bike or pool walking exercises. Due to Renal disease use of narcotics in low doses is appropriate, advised the least amount that is effective. Avoid increasing the strength and quantity of this narcotic hydrocodone. Uric acid level is elevated, please contact Dr. Caleb Popp and ask if he can adjust your medications for uric acid elevation.   Follow-Up Instructions: Return in about 3 months (around 11/21/2018).   Orders:  No orders of the defined types were placed in this encounter.  No orders of the defined types were placed in this encounter.     Procedures: No procedures performed   Clinical Data: No additional findings.   Subjective: Chief Complaint  Patient presents with  . Lower Back - Follow-up  Body mass index is 57.59 kg/m.   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 She is having pain in her back and into the  right posterior thigh and buttocks.  She has undergone previous MRI without significant findings. She has pain that is more in there right leg. Pain with bending and stooping and with walking. Walking with her daughter she went shopping. She has questions about tennis shoes for her diabetes. She has diabetic shoes but no recent inserts and would Like to consider a different shoe. Has sharp pain in her feet and is on insulin for her diabetes, she is experiencing kidney problems. She is to see the vascular Specialist to consider an A-V shunt for possible dialysis.   Review of Systems  Constitutional: Negative.  Negative for activity change.  HENT: Negative.   Eyes: Negative.   Respiratory: Negative.   Cardiovascular: Negative.   Gastrointestinal: Negative.   Endocrine: Negative.   Genitourinary: Negative.   Musculoskeletal: Negative.   Skin: Negative.   Allergic/Immunologic: Negative.   Neurological: Negative.   Hematological: Negative.   Psychiatric/Behavioral: Negative.      Objective: Vital Signs: BP (!) 149/63 (BP Location: Left Arm, Patient Position: Sitting)   Pulse 83   Ht 5\' 1"  (1.549 m)   Wt (!) 305 lb (138.3 kg)   BMI 57.63 kg/m   Physical Exam  Back Exam   Tenderness  The patient is experiencing tenderness in the lumbar.  Range of Motion  Extension: abnormal  Flexion: normal  Lateral bend right: normal  Lateral bend left: normal  Rotation right: normal  Rotation left: normal   Muscle Strength  Right Quadriceps:  5/5  Left Quadriceps:  5/5  Right Hamstrings:  5/5  Left Hamstrings:  5/5   Tests  Straight leg raise right: negative Straight leg raise left: negative  Reflexes  Patellar: 0/4 Biceps: 0/4 Babinski's sign: normal   Other  Toe walk: normal Heel walk: normal Sensation: normal Gait: normal  Erythema: no back redness  Comments:  Left 4th and 5th toe amputation.      Specialty Comments:  No specialty comments  available.  Imaging: No results found.   PMFS History: Patient Active Problem List   Diagnosis Date Noted  . Hypertensive urgency 05/13/2018  . Fall 02/08/2018  . Chronic respiratory failure with hypoxia ([place]) 02/08/2018  . Neuropathy 10/31/2016  . Shortness of breath 10/23/2016  . Acute on chronic diastolic CHF (congestive heart failure) ([place]) 10/23/2016  . GERD (gastroesophageal reflux disease) 10/23/2016  . Severe obesity (BMI >= 40) ([place]) 08/28/2015  . Dyspnea 08/27/2015  . CKD (chronic kidney disease), stage IV ([place]) 07/17/2015  . Insulin-requiring or dependent type II diabetes mellitus ([place]) 07/17/2015  . Chronic anemia 07/17/2015  . Hypertension 07/17/2015  . Peripheral vascular disease, unspecified ([place]) 05/07/2012  . OSA (obstructive sleep apnea) 04/05/2011   Past Medical History:  Diagnosis Date  . Anemia   . Arthritis Dec. 2014   Gout  . Asthma   . CHF (congestive heart failure) ([place])   . COPD (chronic obstructive pulmonary disease) ([place])   . Diabetes mellitus   . Heart failure   . Hyperlipidemia   . Hypertension   . Kidney disease   . Morbid obesity ([place])   . Peripheral vascular disease ([place])   . Pinched nerve   . Sleep apnea     Family History  Problem Relation Age of Onset  . Lung cancer Mother   . Clotting disorder Mother   . Heart disease Mother   . Cancer Mother        Lung  . Deep vein thrombosis Mother   . Diabetes Mother   . Hypertension Mother   . Varicose Veins Mother   . Cancer Father   . Clotting disorder Sister   . Heart attack Sister   . Diabetes Sister   . Clotting disorder Brother   . Deep vein thrombosis Brother   . Diabetes Brother   . Heart disease Brother   . Heart disease Sister     Past Surgical History:  Procedure Laterality Date  . Atherectomy and angioplasty  10/18/2011   left posterior tibial artery  . HAND SURGERY     right  . OVARY SURGERY     Left  . TOE AMPUTATION  Sept. 25,2012   Left 4th and 5th toes    Social History   Occupational History  . 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  Tobacco Use  . Smoking status: Never Smoker  . Smokeless tobacco: Never Used  Substance and Sexual Activity  . Alcohol use: No    Alcohol/week: 0.0 standard drinks  . Drug use: Yes    Types: Marijuana    Comment: FORMER>>smoked weed from age 45 to 38>> quit at age 26   . Sexual activity: Not on file

## 2018-08-22 NOTE — Patient Instructions (Addendum)
Avoid bending, stooping and avoid lifting weights greater than 10 lbs. Avoid prolong standing and walking. Avoid frequent bending and stooping  No lifting greater than 10 lbs. May use ice or moist heat for pain. Weight loss is of benefit. Handicap license is approved. Diabetic shoe with insert prescription. Flexion exercises of the lumbar spine and use of a stationary bike or pool walking exercises. Due to Renal disease use of narcotics in low doses is appropriate, advised the least amount that is effective. Avoid increasing the strength and quantity of this narcotic hydrocodone. Uric acid level is elevated, please contact Dr. Caleb Popp and ask if he can adjust your medications for uric acid elevation.

## 2018-08-24 DIAGNOSIS — I130 Hypertensive heart and chronic kidney disease with heart failure and stage 1 through stage 4 chronic kidney disease, or unspecified chronic kidney disease: Secondary | ICD-10-CM | POA: Diagnosis not present

## 2018-08-24 DIAGNOSIS — M5136 Other intervertebral disc degeneration, lumbar region: Secondary | ICD-10-CM | POA: Diagnosis not present

## 2018-08-24 DIAGNOSIS — J449 Chronic obstructive pulmonary disease, unspecified: Secondary | ICD-10-CM | POA: Diagnosis not present

## 2018-08-24 DIAGNOSIS — E1140 Type 2 diabetes mellitus with diabetic neuropathy, unspecified: Secondary | ICD-10-CM | POA: Diagnosis not present

## 2018-08-24 DIAGNOSIS — I5033 Acute on chronic diastolic (congestive) heart failure: Secondary | ICD-10-CM | POA: Diagnosis not present

## 2018-08-24 DIAGNOSIS — E1151 Type 2 diabetes mellitus with diabetic peripheral angiopathy without gangrene: Secondary | ICD-10-CM | POA: Diagnosis not present

## 2018-08-27 DIAGNOSIS — M5136 Other intervertebral disc degeneration, lumbar region: Secondary | ICD-10-CM | POA: Diagnosis not present

## 2018-08-27 DIAGNOSIS — E1151 Type 2 diabetes mellitus with diabetic peripheral angiopathy without gangrene: Secondary | ICD-10-CM | POA: Diagnosis not present

## 2018-08-27 DIAGNOSIS — E1140 Type 2 diabetes mellitus with diabetic neuropathy, unspecified: Secondary | ICD-10-CM | POA: Diagnosis not present

## 2018-08-27 DIAGNOSIS — J449 Chronic obstructive pulmonary disease, unspecified: Secondary | ICD-10-CM | POA: Diagnosis not present

## 2018-08-27 DIAGNOSIS — I130 Hypertensive heart and chronic kidney disease with heart failure and stage 1 through stage 4 chronic kidney disease, or unspecified chronic kidney disease: Secondary | ICD-10-CM | POA: Diagnosis not present

## 2018-08-27 DIAGNOSIS — I5033 Acute on chronic diastolic (congestive) heart failure: Secondary | ICD-10-CM | POA: Diagnosis not present

## 2018-08-29 ENCOUNTER — Encounter (HOSPITAL_COMMUNITY): Payer: Medicare Other

## 2018-08-29 DIAGNOSIS — I5033 Acute on chronic diastolic (congestive) heart failure: Secondary | ICD-10-CM | POA: Diagnosis not present

## 2018-08-29 DIAGNOSIS — E1151 Type 2 diabetes mellitus with diabetic peripheral angiopathy without gangrene: Secondary | ICD-10-CM | POA: Diagnosis not present

## 2018-08-29 DIAGNOSIS — J449 Chronic obstructive pulmonary disease, unspecified: Secondary | ICD-10-CM | POA: Diagnosis not present

## 2018-08-29 DIAGNOSIS — I130 Hypertensive heart and chronic kidney disease with heart failure and stage 1 through stage 4 chronic kidney disease, or unspecified chronic kidney disease: Secondary | ICD-10-CM | POA: Diagnosis not present

## 2018-08-29 DIAGNOSIS — E1140 Type 2 diabetes mellitus with diabetic neuropathy, unspecified: Secondary | ICD-10-CM | POA: Diagnosis not present

## 2018-08-29 DIAGNOSIS — M5136 Other intervertebral disc degeneration, lumbar region: Secondary | ICD-10-CM | POA: Diagnosis not present

## 2018-09-04 DIAGNOSIS — E1151 Type 2 diabetes mellitus with diabetic peripheral angiopathy without gangrene: Secondary | ICD-10-CM | POA: Diagnosis not present

## 2018-09-04 DIAGNOSIS — J449 Chronic obstructive pulmonary disease, unspecified: Secondary | ICD-10-CM | POA: Diagnosis not present

## 2018-09-04 DIAGNOSIS — I130 Hypertensive heart and chronic kidney disease with heart failure and stage 1 through stage 4 chronic kidney disease, or unspecified chronic kidney disease: Secondary | ICD-10-CM | POA: Diagnosis not present

## 2018-09-04 DIAGNOSIS — I5033 Acute on chronic diastolic (congestive) heart failure: Secondary | ICD-10-CM | POA: Diagnosis not present

## 2018-09-04 DIAGNOSIS — M5136 Other intervertebral disc degeneration, lumbar region: Secondary | ICD-10-CM | POA: Diagnosis not present

## 2018-09-04 DIAGNOSIS — E1140 Type 2 diabetes mellitus with diabetic neuropathy, unspecified: Secondary | ICD-10-CM | POA: Diagnosis not present

## 2018-09-05 DIAGNOSIS — J449 Chronic obstructive pulmonary disease, unspecified: Secondary | ICD-10-CM | POA: Diagnosis not present

## 2018-09-05 DIAGNOSIS — E1140 Type 2 diabetes mellitus with diabetic neuropathy, unspecified: Secondary | ICD-10-CM | POA: Diagnosis not present

## 2018-09-05 DIAGNOSIS — E1151 Type 2 diabetes mellitus with diabetic peripheral angiopathy without gangrene: Secondary | ICD-10-CM | POA: Diagnosis not present

## 2018-09-05 DIAGNOSIS — I5033 Acute on chronic diastolic (congestive) heart failure: Secondary | ICD-10-CM | POA: Diagnosis not present

## 2018-09-05 DIAGNOSIS — I130 Hypertensive heart and chronic kidney disease with heart failure and stage 1 through stage 4 chronic kidney disease, or unspecified chronic kidney disease: Secondary | ICD-10-CM | POA: Diagnosis not present

## 2018-09-05 DIAGNOSIS — M5136 Other intervertebral disc degeneration, lumbar region: Secondary | ICD-10-CM | POA: Diagnosis not present

## 2018-09-06 ENCOUNTER — Encounter (HOSPITAL_COMMUNITY)
Admission: RE | Admit: 2018-09-06 | Discharge: 2018-09-06 | Disposition: A | Discharge status: Home / Self Care | Payer: Medicare Other | Source: Ambulatory Visit | Attending: Nephrology | Admitting: Nephrology

## 2018-09-06 VITALS — BP 162/80 | HR 76 | Resp 18

## 2018-09-06 DIAGNOSIS — Z5181 Encounter for therapeutic drug level monitoring: Secondary | ICD-10-CM | POA: Insufficient documentation

## 2018-09-06 DIAGNOSIS — D631 Anemia in chronic kidney disease: Secondary | ICD-10-CM | POA: Diagnosis not present

## 2018-09-06 DIAGNOSIS — Z79899 Other long term (current) drug therapy: Secondary | ICD-10-CM | POA: Insufficient documentation

## 2018-09-06 DIAGNOSIS — N183 Chronic kidney disease, stage 3 (moderate): Secondary | ICD-10-CM | POA: Insufficient documentation

## 2018-09-06 DIAGNOSIS — N184 Chronic kidney disease, stage 4 (severe): Secondary | ICD-10-CM

## 2018-09-06 LAB — RENAL FUNCTION PANEL
ANION GAP: 10 (ref 5–15)
Albumin: 3.2 g/dL — ABNORMAL LOW (ref 3.5–5.0)
BUN: 67 mg/dL — AB (ref 6–20)
CHLORIDE: 108 mmol/L (ref 98–111)
CO2: 24 mmol/L (ref 22–32)
Calcium: 9.5 mg/dL (ref 8.9–10.3)
Creatinine, Ser: 3.93 mg/dL — ABNORMAL HIGH (ref 0.44–1.00)
GFR calc Af Amer: 13 mL/min — ABNORMAL LOW (ref >60)
GFR calc non Af Amer: 12 mL/min — ABNORMAL LOW (ref >60)
GLUCOSE: 57 mg/dL — AB (ref 70–99)
PHOSPHORUS: 4.6 mg/dL (ref 2.5–4.6)
POTASSIUM: 4.3 mmol/L (ref 3.5–5.1)
Sodium: 142 mmol/L (ref 135–145)

## 2018-09-06 LAB — CBC WITH DIFFERENTIAL/PLATELET
ABS IMMATURE GRANULOCYTES: 0 10*3/uL (ref 0.0–0.1)
BASOS ABS: 0 10*3/uL (ref 0.0–0.1)
Basophils Relative: 1 %
EOS PCT: 2 %
Eosinophils Absolute: 0.1 10*3/uL (ref 0.0–0.7)
HEMATOCRIT: 33.6 % — AB (ref 36.0–46.0)
Hemoglobin: 9.5 g/dL — ABNORMAL LOW (ref 12.0–15.0)
Immature Granulocytes: 0 %
LYMPHS PCT: 24 %
Lymphs Abs: 1.8 10*3/uL (ref 0.7–4.0)
MCH: 25.4 pg — ABNORMAL LOW (ref 26.0–34.0)
MCHC: 28.3 g/dL — ABNORMAL LOW (ref 30.0–36.0)
MCV: 89.8 fL (ref 78.0–100.0)
MONO ABS: 0.4 10*3/uL (ref 0.1–1.0)
Monocytes Relative: 6 %
NEUTROS ABS: 4.8 10*3/uL (ref 1.7–7.7)
Neutrophils Relative %: 67 %
PLATELETS: 222 10*3/uL (ref 150–400)
RBC: 3.74 MIL/uL — ABNORMAL LOW (ref 3.87–5.11)
RDW: 16.3 % — AB (ref 11.5–15.5)
WBC: 7.2 10*3/uL (ref 4.0–10.5)

## 2018-09-06 LAB — IRON AND TIBC
Iron: 77 ug/dL (ref 28–170)
SATURATION RATIOS: 38 % — AB (ref 10.4–31.8)
TIBC: 203 ug/dL — AB (ref 250–450)
UIBC: 126 ug/dL

## 2018-09-06 LAB — FERRITIN: FERRITIN: 1003 ng/mL — AB (ref 11–307)

## 2018-09-06 MED ORDER — DARBEPOETIN ALFA 150 MCG/0.3ML IJ SOSY: 120.0000 ug | PREFILLED_SYRINGE | INTRAMUSCULAR | Status: DC

## 2018-09-06 MED ORDER — DARBEPOETIN ALFA 60 MCG/0.3ML IJ SOSY
PREFILLED_SYRINGE | INTRAMUSCULAR | Status: AC
  Administered 2018-09-06: 120 ug via SUBCUTANEOUS
  Filled 2018-09-06: qty 0.6

## 2018-09-17 ENCOUNTER — Encounter: Payer: Self-pay | Admitting: Surgery

## 2018-09-17 ENCOUNTER — Other Ambulatory Visit: Payer: Self-pay | Admitting: *Deleted

## 2018-09-17 ENCOUNTER — Ambulatory Visit (HOSPITAL_COMMUNITY)
Admission: RE | Admit: 2018-09-17 | Discharge: 2018-09-17 | Disposition: A | Discharge status: Home / Self Care | Payer: Medicare Other | Source: Ambulatory Visit | Attending: Vascular Surgery | Admitting: Vascular Surgery

## 2018-09-17 ENCOUNTER — Encounter: Payer: Self-pay | Admitting: *Deleted

## 2018-09-17 ENCOUNTER — Ambulatory Visit (INDEPENDENT_AMBULATORY_CARE_PROVIDER_SITE_OTHER): Payer: Medicare Other | Admitting: Surgery

## 2018-09-17 ENCOUNTER — Ambulatory Visit (INDEPENDENT_AMBULATORY_CARE_PROVIDER_SITE_OTHER)
Admission: RE | Admit: 2018-09-17 | Discharge: 2018-09-17 | Disposition: A | Discharge status: Home / Self Care | Payer: Medicare Other | Source: Ambulatory Visit | Attending: Vascular Surgery | Admitting: Vascular Surgery

## 2018-09-17 ENCOUNTER — Other Ambulatory Visit: Payer: Self-pay

## 2018-09-17 VITALS — BP 168/76 | HR 75 | Temp 98.1°F | Resp 22 | Ht 61.0 in | Wt 308.0 lb

## 2018-09-17 DIAGNOSIS — Z01812 Encounter for preprocedural laboratory examination: Secondary | ICD-10-CM

## 2018-09-17 DIAGNOSIS — N184 Chronic kidney disease, stage 4 (severe): Secondary | ICD-10-CM

## 2018-09-17 NOTE — Progress Notes (Signed)
[facility] [place] [facility]  Patient name: Catherine Williams MRN: [id number] DOB: 01/29/1961 Sex: female   REQUESTING PROVIDER:    Dr. Marval Regal   REASON FOR CONSULT:    Dialysis access  HISTORY OF PRESENT ILLNESS:   Catherine Williams is a 57 y.o. female, who is referred today for dialysis access.  The patient's renal failure secondary to diabetes hypertension, and obesity.  She is right-handed.  She also suffers from congestive heart failure and COPD, for which she is on home oxygen and CPAP.  She takes a statin for hypercholesterolemia.  She is a non-smoker  The patient has a history of peripheral vascular disease.  I performed atherectomy with angioplasty of her left posterior tibial artery for toe ulcer in 2012.  PAST MEDICAL HISTORY    Past Medical History:  Diagnosis Date  . Anemia   . Arthritis Dec. 2014   Gout  . Asthma   . CHF (congestive heart failure) ([place])   . COPD (chronic obstructive pulmonary disease) ([place])   . Diabetes mellitus   . Heart failure   . Hyperlipidemia   . Hypertension   . Kidney disease   . Morbid obesity ([place])   . Peripheral vascular disease ([place])   . Pinched nerve   . Sleep apnea      FAMILY HISTORY   Family History  Problem Relation Age of Onset  . Lung cancer Mother   . Clotting disorder Mother   . Heart disease Mother   . Cancer Mother        Lung  . Deep vein thrombosis Mother   . Diabetes Mother   . Hypertension Mother   . Varicose Veins Mother   . Cancer Father   . Clotting disorder Sister   . Heart attack Sister   . Diabetes Sister   . Clotting disorder Brother   . Deep vein thrombosis Brother   . Diabetes Brother   . Heart disease Brother   . Heart disease Sister     SOCIAL HISTORY:   Social History   Socioeconomic History  . Marital status: Single    Spouse name: Not on file  . Number of children: Y  . Years of education: Not on file  . Highest education  level: Not on file  Occupational History  . 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  Social Needs  . Financial resource strain: Not on file  . Food insecurity:    Worry: Not on file    Inability: Not on file  . Transportation needs:    Medical: Not on file    Non-medical: Not on file  Tobacco Use  . Smoking status: Never Smoker  . Smokeless tobacco: Never Used  Substance and Sexual Activity  . Alcohol use: No    Alcohol/week: 0.0 standard drinks  . Drug use: Yes    Types: Marijuana    Comment: FORMER>>smoked weed from age 38 to 38>> quit at age 79   . Sexual activity: Not on file  Lifestyle  . Physical activity:    Days per week: Not on file    Minutes per session: Not on file  . Stress: Not on file  Relationships  . Social connections:    Talks on phone: Not on file    Gets together: Not on file    Attends religious service: Not on file    Active member of club or organization: Not  on file    Attends meetings of clubs or organizations: Not on file    Relationship status: Not on file  . Intimate partner violence:    Fear of current or ex partner: Not on file    Emotionally abused: Not on file    Physically abused: Not on file    Forced sexual activity: Not on file  Other Topics Concern  . Not on file  Social History Narrative   Lives with daughter          ALLERGIES:    Allergies  Allergen Reactions  . Omnipaque [Iohexol] Nausea And Vomiting    Contrast Dye- Effects Kidney's    CURRENT MEDICATIONS:    Current Outpatient Medications  Medication Sig Dispense Refill  . albuterol (PROVENTIL HFA;VENTOLIN HFA) 108 (90 BASE) MCG/ACT inhaler Inhale 1-2 puffs into the lungs every 6 (six) hours as needed for wheezing. 1 Inhaler 0  . allopurinol (ZYLOPRIM) 100 MG tablet Take 1 tablet (100 mg total) by mouth at bedtime. 30 tablet 6  . amLODipine (NORVASC) 5 MG tablet Take 1 tablet (5 mg total) by mouth daily. 30 tablet  4  . aspirin 81 MG tablet Take 81 mg by mouth daily.      . carvedilol (COREG) 6.25 MG tablet Take 1 tablet (6.25 mg total) by mouth 2 (two) times daily with a meal. 60 tablet 3  . Darbepoetin Alfa (ARANESP) 60 MCG/0.3ML SOSY injection Inject 0.3 mLs (60 mcg total) into the skin every Thursday at 6pm. (Patient taking differently: Inject 60 mcg into the skin every 30 (thirty) days. ) 1.2 mL 0  . ferrous sulfate 325 (65 FE) MG tablet Take 1 tablet (325 mg total) by mouth 2 (two) times daily with a meal. 60 tablet 0  . furosemide (LASIX) 80 MG tablet Take 1 tablet (80 mg)  in the morning, and 1/2 tablet (40 mg)  in the evening 60 tablet 3  . HYDROcodone-acetaminophen (NORCO/VICODIN) 5-325 MG tablet Take 1 tablet by mouth every 6 (six) hours as needed. 10 tablet 0  . insulin aspart (NOVOLOG) 100 UNIT/ML injection Inject 20 Units into the skin 3 (three) times daily with meals.     . insulin glargine (LANTUS) 100 UNIT/ML injection Inject 60 Units into the skin at bedtime.     . isosorbide-hydrALAZINE (BIDIL) 20-37.5 MG tablet Take 2 tablets by mouth 3 (three) times daily. 180 tablet 3  . loratadine (CLARITIN) 10 MG tablet Take 10 mg by mouth daily.    . pantoprazole (PROTONIX) 40 MG tablet Take 1 tablet (40 mg total) by mouth daily. 30 tablet 2  . rosuvastatin (CRESTOR) 40 MG tablet Take 40 mg by mouth every evening.      No current facility-administered medications for this visit.     REVIEW OF SYSTEMS:   [X]  denotes positive finding, [ ]  denotes negative finding Cardiac  Comments:  Chest pain or chest pressure:    Shortness of breath upon exertion:    Short of breath when lying flat:    Irregular heart rhythm:        Vascular    Pain in calf, thigh, or hip brought on by ambulation:    Pain in feet at night that wakes you up from your sleep:     Blood clot in your veins:    Leg swelling:         Pulmonary    Oxygen at home: x   Productive cough:     Wheezing:  Neurologic      Sudden weakness in arms or legs:     Sudden numbness in arms or legs:     Sudden onset of difficulty speaking or slurred speech:    Temporary loss of vision in one eye:     Problems with dizziness:         Gastrointestinal    Blood in stool:      Vomited blood:         Genitourinary    Burning when urinating:     Blood in urine:        Psychiatric    Major depression:         Hematologic    Bleeding problems:    Problems with blood clotting too easily:        Skin    Rashes or ulcers:        Constitutional    Fever or chills:     PHYSICAL EXAM:   Vitals:   09/17/18 1223  BP: (!) 174/80  Pulse: 75  Resp: (!) 22  Temp: 98.1 F (36.7 C)  TempSrc: Oral  SpO2: 94%  Weight: (!) 308 lb (139.7 kg)  Height: 5\' 1"  (1.549 m)    GENERAL: The patient is a well-nourished female, in no acute distress. The vital signs are documented above. CARDIAC: There is a regular rate and rhythm.  VASCULAR: Palpable left radial pulse PULMONARY: Nonlabored respirations MUSCULOSKELETAL: There are no major deformities or cyanosis. NEUROLOGIC: No focal weakness or paresthesias are detected. SKIN: There are no ulcers or rashes noted. PSYCHIATRIC: The patient has a normal affect.  STUDIES:   I have ordered and reviewed her vascular lab studies with the following findings:  +-----------------+-------------+----------+--------------+ Left Cephalic  Diameter (cm)Depth (cm)  Findings   +-----------------+-------------+----------+--------------+ Shoulder       0.37                +-----------------+-------------+----------+--------------+ Prox upper arm    0.37         branching   +-----------------+-------------+----------+--------------+ Mid upper arm    0.35         branching   +-----------------+-------------+----------+--------------+ Dist upper arm    0.39                 +-----------------+-------------+----------+--------------+ Antecubital fossa0.29 / 0.513              +-----------------+-------------+----------+--------------+ Prox forearm               not visualized +-----------------+-------------+----------+--------------+ Mid forearm     0.25                +-----------------+-------------+----------+--------------+ Dist forearm    0.17 / 0.24              +-----------------+-------------+----------+--------------+  +-----------------+-------------+----------+--------------+ Left Basilic   Diameter (cm)Depth (cm)  Findings   +-----------------+-------------+----------+--------------+ Prox upper arm  0.61 / 0.267        joins    +-----------------+-------------+----------+--------------+ Mid upper arm    0.16                +-----------------+-------------+----------+--------------+ Dist upper arm    0.16                +-----------------+-------------+----------+--------------+ Antecubital fossa0.19 / 0.513              +-----------------+-------------+----------+--------------+ Prox forearm               not visualized +-----------------+-------------+----------+--------------+ Distal forearm    / 0.24               +-----------------+-------------+----------+--------------+  Arterial studies were triphasic.  ASSESSMENT and PLAN   CKD 4: After reviewing her vein mapping, I feel that she is a candidate for a left arm fistula.  Her cephalic vein looks to be the most promising vein.  Therefore, I would proceed with a left brachiocephalic fistula.  I discussed that she will most likely need a second operation for elevation of her fistula.  We also discussed the risk of not maturity, the need for future interventions, and the risk of steal  syndrome.  I have put her on the schedule for Thursday, August 17.   Annamarie Major, MD [facility] [phone] Pager [phone]

## 2018-09-26 ENCOUNTER — Encounter (HOSPITAL_COMMUNITY)
Admission: RE | Admit: 2018-09-26 | Discharge: 2018-09-26 | Disposition: A | Discharge status: Home / Self Care | Payer: Medicare Other | Source: Ambulatory Visit | Attending: Nephrology | Admitting: Nephrology

## 2018-09-26 VITALS — BP 143/64 | HR 73 | Temp 98.6°F | Resp 20

## 2018-09-26 DIAGNOSIS — E1122 Type 2 diabetes mellitus with diabetic chronic kidney disease: Secondary | ICD-10-CM | POA: Diagnosis not present

## 2018-09-26 DIAGNOSIS — Z79899 Other long term (current) drug therapy: Secondary | ICD-10-CM | POA: Insufficient documentation

## 2018-09-26 DIAGNOSIS — Z5181 Encounter for therapeutic drug level monitoring: Secondary | ICD-10-CM | POA: Diagnosis not present

## 2018-09-26 DIAGNOSIS — I10 Essential (primary) hypertension: Secondary | ICD-10-CM | POA: Diagnosis not present

## 2018-09-26 DIAGNOSIS — D631 Anemia in chronic kidney disease: Secondary | ICD-10-CM | POA: Diagnosis not present

## 2018-09-26 DIAGNOSIS — N183 Chronic kidney disease, stage 3 (moderate): Secondary | ICD-10-CM | POA: Insufficient documentation

## 2018-09-26 DIAGNOSIS — R635 Abnormal weight gain: Secondary | ICD-10-CM | POA: Diagnosis not present

## 2018-09-26 DIAGNOSIS — I509 Heart failure, unspecified: Secondary | ICD-10-CM | POA: Diagnosis not present

## 2018-09-26 DIAGNOSIS — R600 Localized edema: Secondary | ICD-10-CM | POA: Diagnosis not present

## 2018-09-26 DIAGNOSIS — N184 Chronic kidney disease, stage 4 (severe): Secondary | ICD-10-CM

## 2018-09-26 LAB — RENAL FUNCTION PANEL
Albumin: 3.3 g/dL — ABNORMAL LOW (ref 3.5–5.0)
Anion gap: 8 (ref 5–15)
BUN: 71 mg/dL — AB (ref 6–20)
CALCIUM: 9.7 mg/dL (ref 8.9–10.3)
CHLORIDE: 108 mmol/L (ref 98–111)
CO2: 25 mmol/L (ref 22–32)
CREATININE: 4.09 mg/dL — AB (ref 0.44–1.00)
GFR calc Af Amer: 13 mL/min — ABNORMAL LOW (ref >60)
GFR, EST NON AFRICAN AMERICAN: 11 mL/min — AB (ref >60)
Glucose, Bld: 68 mg/dL — ABNORMAL LOW (ref 70–99)
Phosphorus: 5.1 mg/dL — ABNORMAL HIGH (ref 2.5–4.6)
Potassium: 4.7 mmol/L (ref 3.5–5.1)
Sodium: 141 mmol/L (ref 135–145)

## 2018-09-26 LAB — CBC WITH DIFFERENTIAL/PLATELET
Abs Immature Granulocytes: 0.03 10*3/uL (ref 0.00–0.07)
Basophils Absolute: 0 10*3/uL (ref 0.0–0.1)
Basophils Relative: 1 %
EOS ABS: 0.1 10*3/uL (ref 0.0–0.5)
EOS PCT: 2 %
HCT: 35.2 % — ABNORMAL LOW (ref 36.0–46.0)
HEMOGLOBIN: 9.6 g/dL — AB (ref 12.0–15.0)
Immature Granulocytes: 0 %
LYMPHS PCT: 18 %
Lymphs Abs: 1.5 10*3/uL (ref 0.7–4.0)
MCH: 24.7 pg — AB (ref 26.0–34.0)
MCHC: 27.3 g/dL — AB (ref 30.0–36.0)
MCV: 90.5 fL (ref 80.0–100.0)
Monocytes Absolute: 0.5 10*3/uL (ref 0.1–1.0)
Monocytes Relative: 6 %
Neutro Abs: 6.2 10*3/uL (ref 1.7–7.7)
Neutrophils Relative %: 73 %
Platelets: 199 10*3/uL (ref 150–400)
RBC: 3.89 MIL/uL (ref 3.87–5.11)
RDW: 16.1 % — ABNORMAL HIGH (ref 11.5–15.5)
WBC: 8.3 10*3/uL (ref 4.0–10.5)
nRBC: 0 % (ref 0.0–0.2)

## 2018-09-26 MED ORDER — DARBEPOETIN ALFA 60 MCG/0.3ML IJ SOSY
PREFILLED_SYRINGE | INTRAMUSCULAR | Status: AC
  Administered 2018-09-26: 120 ug via SUBCUTANEOUS
  Filled 2018-09-26: qty 0.6

## 2018-09-26 MED ORDER — DARBEPOETIN ALFA 150 MCG/0.3ML IJ SOSY: 120.0000 ug | PREFILLED_SYRINGE | INTRAMUSCULAR | Status: DC

## 2018-10-03 ENCOUNTER — Encounter (HOSPITAL_COMMUNITY): Payer: Self-pay | Admitting: *Deleted

## 2018-10-03 ENCOUNTER — Other Ambulatory Visit: Payer: Self-pay

## 2018-10-03 MED ORDER — DEXTROSE 5 % IV SOLN
3.0000 g | INTRAVENOUS | Status: DC
  Filled 2018-10-03: qty 3000

## 2018-10-03 NOTE — Progress Notes (Signed)
Pt denies any acute cardiopulmonary issues. Pt stated that she is under the care of Dr. Einar Gip, Cardiology. Pt stated that a stress test was performed > 5 years ago in [place]. Pt made aware to stop taking vitamins, fish oil and herbal medications. Do not take any NSAIDs ie: Ibuprofen, Advil, Naproxen (Aleve), Motrin, BC and Goody Powder. Pt made aware to take 30 units of Lantis insulin tonight and no Novolog insulin on DOS (taken with meals). Pt made aware to check BG every 2 hours prior to arrival to hospital on DOS. Pt made aware to treat a BG < 70 with 4 ounces of apple  juice, wait 15 minutes after intervention to recheck BG, if BG remains < 70, call Short Stay unit to speak with a nurse. Pt verbalized understanding of all pre-op instructions. See anesthesia note.

## 2018-10-03 NOTE — Progress Notes (Signed)
Anesthesia Chart Review: Catherine Williams  Case:  [id number] Date/Time:  10/04/18 0907   Procedure:  ARTERIOVENOUS (AV) FISTULA CREATION LEFT ARM (Left )   Anesthesia type:  Monitor Anesthesia Care   Pre-op diagnosis:  CHRONIC KIDNEY DISEASE FOR HEMODIALYSIS ACCESS   Location:  [facility] OR ROOM 12 / [facility] OR   Surgeon:  Serafina Mitchell, MD      DISCUSSION: Patient is a 57 year old female scheduled for the above procedure.   History includes never smoker, HTN, CHF, DM, HLD, OSA (CPAP, nocturnal O2), morbid obesity, COPD, PAD (atherectomy/angioplast left PTA, s/p left 4th/5th toe amputations 08/2011), anemia, CKD (not yet on HD).   She is for labs and evaluation on the day of surgery.    PROVIDERS: Vonna Drafts, FNP is PCP. Earnie Larsson, Manish, MD is cardiologist. He saw her in consultation 05/13/18 during hospitalization for chest pain and SOB felt multifactorial due to uncontrolled HTN, advancing CKD, and possible HFpEF. He recommended starting b-blocker therapy and better BP control. Echo was ordered (normal LVEF, wall motion), but otherwise he did not think other non-invasive ischemic testing would be beneficial at that time. (Only minor CAD by 2010 cath.) Nephrology consult recommended. Kara Mead, MD is pulmonologist (for OSA).  Donato Heinz, MD is nephrologist.   LABS: She is for labs on the day of surgery.   IMAGES: CXR 05/12/18: IMPRESSION: Cardiomegaly with vascular congestion and mild interstitial pulmonary edema.   EKG: 05/12/18: NSR, pulmonary disease pattern. Incomplete right BBB. LAFB. LVH with repolarization abnormality. Cannot rule out septal infarct, age undetermined. No significant change when compared to 02/13/18 tracing.    CV: Echo 05/14/18: Study Conclusions - Left ventricle: The cavity size was normal. Systolic function was   normal. The estimated ejection fraction was in the range of 55%   to 60%. Wall motion was normal; there were no regional wall  motion abnormalities. Doppler parameters are consistent with   abnormal left ventricular relaxation (grade 1 diastolic   dysfunction). - Aortic valve: Not visualized. - Aortic root: Not visualized. - Mitral valve: Not visualized. - Left atrium: The atrium was normal in size. - Right ventricle: Not visualized. - Inferior vena cava: Not visualized. Impressions: - Poor acoustic windows, LVEF on Definity images normal, RV not   visualized.  Cardiac cath 05/13/09:  CONCLUSIONS:  1. Minor coronary artery regularities.  2. Normal left ventricular systolic function.  She needs aggressive      therapy for her diabetes as well as her blood pressure.  She also      needs an aggressive weight loss program.   Past Medical History:  Diagnosis Date  . Anemia   . Arthritis Dec. 2014   Gout  . Asthma   . CHF (congestive heart failure) ([place])   . COPD (chronic obstructive pulmonary disease) ([place])   . Diabetes mellitus   . Heart failure   . Hyperlipidemia   . Hypertension   . Kidney disease   . Morbid obesity ([place])   . Peripheral vascular disease ([place])   . Pinched nerve   . Sleep apnea     Past Surgical History:  Procedure Laterality Date  . Atherectomy and angioplasty  10/18/2011   left posterior tibial artery  . HAND SURGERY     right  . OVARY SURGERY     Left  . TOE AMPUTATION  Sept. 25,2012   Left 4th and 5th toes    MEDICATIONS: . [START ON 10/04/2018] ceFAZolin (  ANCEF) 3 g in dextrose 5 % 50 mL IVPB   . albuterol (PROVENTIL HFA;VENTOLIN HFA) 108 (90 BASE) MCG/ACT inhaler  . allopurinol (ZYLOPRIM) 100 MG tablet  . amLODipine (NORVASC) 5 MG tablet  . aspirin 81 MG tablet  . carvedilol (COREG) 6.25 MG tablet  . Darbepoetin Alfa (ARANESP) 60 MCG/0.3ML SOSY injection  . furosemide (LASIX) 80 MG tablet  . HYDROcodone-acetaminophen (NORCO/VICODIN) 5-325 MG tablet  . insulin aspart (NOVOLOG) 100 UNIT/ML injection  . insulin glargine (LANTUS) 100 UNIT/ML injection  .  isosorbide-hydrALAZINE (BIDIL) 20-37.5 MG tablet  . loratadine (CLARITIN) 10 MG tablet  . pantoprazole (PROTONIX) 40 MG tablet  . rosuvastatin (CRESTOR) 40 MG tablet  . ferrous sulfate 325 (65 FE) MG tablet    George Hugh [facility] Short Stay Center/Anesthesiology Phone [phone] 10/03/2018 12:03 PM

## 2018-10-04 ENCOUNTER — Encounter (HOSPITAL_COMMUNITY): Payer: Self-pay | Admitting: *Deleted

## 2018-10-04 ENCOUNTER — Telehealth: Payer: Self-pay | Admitting: Surgery

## 2018-10-04 ENCOUNTER — Ambulatory Visit (HOSPITAL_COMMUNITY): Payer: Medicare Other | Admitting: Vascular Surgery

## 2018-10-04 ENCOUNTER — Ambulatory Visit (HOSPITAL_COMMUNITY)
Admission: RE | Admit: 2018-10-04 | Discharge: 2018-10-04 | Disposition: A | Discharge status: Home / Self Care | Payer: Medicare Other | Source: Ambulatory Visit | Attending: Surgery | Admitting: Surgery

## 2018-10-04 ENCOUNTER — Encounter (HOSPITAL_COMMUNITY): Payer: Medicare Other

## 2018-10-04 ENCOUNTER — Encounter (HOSPITAL_COMMUNITY)
Admission: RE | Disposition: A | Discharge status: Home / Self Care | Payer: Self-pay | Source: Ambulatory Visit | Attending: Surgery

## 2018-10-04 DIAGNOSIS — E1122 Type 2 diabetes mellitus with diabetic chronic kidney disease: Secondary | ICD-10-CM | POA: Insufficient documentation

## 2018-10-04 DIAGNOSIS — I2510 Atherosclerotic heart disease of native coronary artery without angina pectoris: Secondary | ICD-10-CM

## 2018-10-04 DIAGNOSIS — I4901 Ventricular fibrillation: Secondary | ICD-10-CM | POA: Diagnosis not present

## 2018-10-04 DIAGNOSIS — Z9989 Dependence on other enabling machines and devices: Secondary | ICD-10-CM | POA: Insufficient documentation

## 2018-10-04 DIAGNOSIS — N170 Acute kidney failure with tubular necrosis: Secondary | ICD-10-CM | POA: Diagnosis not present

## 2018-10-04 DIAGNOSIS — Z79891 Long term (current) use of opiate analgesic: Secondary | ICD-10-CM

## 2018-10-04 DIAGNOSIS — J9622 Acute and chronic respiratory failure with hypercapnia: Secondary | ICD-10-CM | POA: Diagnosis not present

## 2018-10-04 DIAGNOSIS — Z23 Encounter for immunization: Secondary | ICD-10-CM | POA: Diagnosis not present

## 2018-10-04 DIAGNOSIS — E1151 Type 2 diabetes mellitus with diabetic peripheral angiopathy without gangrene: Secondary | ICD-10-CM | POA: Insufficient documentation

## 2018-10-04 DIAGNOSIS — J9621 Acute and chronic respiratory failure with hypoxia: Secondary | ICD-10-CM | POA: Diagnosis not present

## 2018-10-04 DIAGNOSIS — N185 Chronic kidney disease, stage 5: Secondary | ICD-10-CM | POA: Diagnosis not present

## 2018-10-04 DIAGNOSIS — Z89422 Acquired absence of other left toe(s): Secondary | ICD-10-CM

## 2018-10-04 DIAGNOSIS — Z794 Long term (current) use of insulin: Secondary | ICD-10-CM | POA: Insufficient documentation

## 2018-10-04 DIAGNOSIS — R570 Cardiogenic shock: Secondary | ICD-10-CM | POA: Diagnosis not present

## 2018-10-04 DIAGNOSIS — M109 Gout, unspecified: Secondary | ICD-10-CM | POA: Insufficient documentation

## 2018-10-04 DIAGNOSIS — J189 Pneumonia, unspecified organism: Secondary | ICD-10-CM | POA: Diagnosis not present

## 2018-10-04 DIAGNOSIS — E785 Hyperlipidemia, unspecified: Secondary | ICD-10-CM | POA: Insufficient documentation

## 2018-10-04 DIAGNOSIS — G4733 Obstructive sleep apnea (adult) (pediatric): Secondary | ICD-10-CM | POA: Insufficient documentation

## 2018-10-04 DIAGNOSIS — Z79899 Other long term (current) drug therapy: Secondary | ICD-10-CM | POA: Insufficient documentation

## 2018-10-04 DIAGNOSIS — I130 Hypertensive heart and chronic kidney disease with heart failure and stage 1 through stage 4 chronic kidney disease, or unspecified chronic kidney disease: Secondary | ICD-10-CM | POA: Insufficient documentation

## 2018-10-04 DIAGNOSIS — I509 Heart failure, unspecified: Secondary | ICD-10-CM

## 2018-10-04 DIAGNOSIS — J449 Chronic obstructive pulmonary disease, unspecified: Secondary | ICD-10-CM | POA: Insufficient documentation

## 2018-10-04 DIAGNOSIS — J9601 Acute respiratory failure with hypoxia: Secondary | ICD-10-CM | POA: Diagnosis not present

## 2018-10-04 DIAGNOSIS — E874 Mixed disorder of acid-base balance: Secondary | ICD-10-CM | POA: Diagnosis not present

## 2018-10-04 DIAGNOSIS — D631 Anemia in chronic kidney disease: Secondary | ICD-10-CM | POA: Insufficient documentation

## 2018-10-04 DIAGNOSIS — Z6841 Body Mass Index (BMI) 40.0 and over, adult: Secondary | ICD-10-CM | POA: Insufficient documentation

## 2018-10-04 DIAGNOSIS — D62 Acute posthemorrhagic anemia: Secondary | ICD-10-CM | POA: Diagnosis not present

## 2018-10-04 DIAGNOSIS — Z7982 Long term (current) use of aspirin: Secondary | ICD-10-CM | POA: Insufficient documentation

## 2018-10-04 DIAGNOSIS — N189 Chronic kidney disease, unspecified: Secondary | ICD-10-CM

## 2018-10-04 DIAGNOSIS — I132 Hypertensive heart and chronic kidney disease with heart failure and with stage 5 chronic kidney disease, or end stage renal disease: Secondary | ICD-10-CM | POA: Diagnosis not present

## 2018-10-04 DIAGNOSIS — N184 Chronic kidney disease, stage 4 (severe): Secondary | ICD-10-CM | POA: Diagnosis not present

## 2018-10-04 DIAGNOSIS — R0989 Other specified symptoms and signs involving the circulatory and respiratory systems: Secondary | ICD-10-CM | POA: Diagnosis not present

## 2018-10-04 DIAGNOSIS — I5033 Acute on chronic diastolic (congestive) heart failure: Secondary | ICD-10-CM | POA: Diagnosis not present

## 2018-10-04 HISTORY — DX: Presence of dental prosthetic device (complete) (partial): Z97.2

## 2018-10-04 HISTORY — PX: AV FISTULA PLACEMENT: SHX1204

## 2018-10-04 HISTORY — DX: Dependence on supplemental oxygen: Z99.81

## 2018-10-04 HISTORY — DX: Gastro-esophageal reflux disease without esophagitis: K21.9

## 2018-10-04 LAB — GLUCOSE, CAPILLARY
GLUCOSE-CAPILLARY: 79 mg/dL (ref 70–99)
GLUCOSE-CAPILLARY: 76 mg/dL (ref 70–99)
Glucose-Capillary: 45 mg/dL — ABNORMAL LOW (ref 70–99)
Glucose-Capillary: 60 mg/dL — ABNORMAL LOW (ref 70–99)
Glucose-Capillary: 85 mg/dL (ref 70–99)

## 2018-10-04 LAB — POCT I-STAT 4, (NA,K, GLUC, HGB,HCT)
GLUCOSE: 51 mg/dL — AB (ref 70–99)
HEMATOCRIT: 32 % — AB (ref 36.0–46.0)
Hemoglobin: 10.9 g/dL — ABNORMAL LOW (ref 12.0–15.0)
Potassium: 4.2 mmol/L (ref 3.5–5.1)
Sodium: 145 mmol/L (ref 135–145)

## 2018-10-04 SURGERY — ARTERIOVENOUS (AV) FISTULA CREATION
Anesthesia: Monitor Anesthesia Care | Site: Arm Upper | Laterality: Left

## 2018-10-04 MED ORDER — PROPOFOL 10 MG/ML IV BOLUS
INTRAVENOUS | Status: AC
  Filled 2018-10-04: qty 20

## 2018-10-04 MED ORDER — SODIUM CHLORIDE 0.9 % IV SOLN
INTRAVENOUS | Status: AC
  Filled 2018-10-04: qty 1.2

## 2018-10-04 MED ORDER — DEXTROSE 5 % IV SOLN
INTRAVENOUS | Status: DC | PRN
  Administered 2018-10-04: 3 g via INTRAVENOUS

## 2018-10-04 MED ORDER — DEXTROSE 50 % IV SOLN
25.0000 mL | Freq: Once | INTRAVENOUS | Status: AC
  Administered 2018-10-04: 25 mL via INTRAVENOUS

## 2018-10-04 MED ORDER — FENTANYL CITRATE (PF) 250 MCG/5ML IJ SOLN
INTRAMUSCULAR | Status: AC
  Filled 2018-10-04: qty 5

## 2018-10-04 MED ORDER — KETAMINE HCL 10 MG/ML IJ SOLN
INTRAMUSCULAR | Status: DC | PRN
  Administered 2018-10-04: 10 mg via INTRAVENOUS
  Administered 2018-10-04: 50 mg via INTRAVENOUS
  Administered 2018-10-04 (×2): 10 mg via INTRAVENOUS
  Administered 2018-10-04: 20 mg via INTRAVENOUS

## 2018-10-04 MED ORDER — SODIUM CHLORIDE 0.9 % IV SOLN
INTRAVENOUS | Status: DC | PRN
  Administered 2018-10-04 (×2): via INTRAVENOUS

## 2018-10-04 MED ORDER — FENTANYL CITRATE (PF) 100 MCG/2ML IJ SOLN: 25.0000 ug | INTRAMUSCULAR | Status: DC | PRN

## 2018-10-04 MED ORDER — METOCLOPRAMIDE HCL 5 MG/ML IJ SOLN: 10.0000 mg | Freq: Once | INTRAMUSCULAR | Status: DC | PRN

## 2018-10-04 MED ORDER — PHENYLEPHRINE HCL 10 MG/ML IJ SOLN
INTRAMUSCULAR | Status: DC | PRN
  Administered 2018-10-04 (×3): 80 ug via INTRAVENOUS

## 2018-10-04 MED ORDER — DEXTROSE 50 % IV SOLN
INTRAVENOUS | Status: AC
  Filled 2018-10-04: qty 50

## 2018-10-04 MED ORDER — CHLORHEXIDINE GLUCONATE 4 % EX LIQD: 60.0000 mL | Freq: Once | CUTANEOUS | Status: DC

## 2018-10-04 MED ORDER — OXYCODONE HCL 5 MG PO TABS: 5.0000 mg | ORAL_TABLET | Freq: Three times a day (TID) | ORAL | 0 refills | Status: DC | PRN

## 2018-10-04 MED ORDER — LIDOCAINE-EPINEPHRINE (PF) 1 %-1:200000 IJ SOLN
INTRAMUSCULAR | Status: AC
  Filled 2018-10-04: qty 30

## 2018-10-04 MED ORDER — DEXTROSE 50 % IV SOLN
INTRAVENOUS | Status: AC
  Administered 2018-10-04: 25 mL via INTRAVENOUS
  Filled 2018-10-04: qty 50

## 2018-10-04 MED ORDER — PROTAMINE SULFATE 10 MG/ML IV SOLN
INTRAVENOUS | Status: DC | PRN
  Administered 2018-10-04: 25 mg via INTRAVENOUS

## 2018-10-04 MED ORDER — MEPERIDINE HCL 50 MG/ML IJ SOLN: 6.2500 mg | INTRAMUSCULAR | Status: DC | PRN

## 2018-10-04 MED ORDER — HEPARIN SODIUM (PORCINE) 1000 UNIT/ML IJ SOLN
INTRAMUSCULAR | Status: DC | PRN
  Administered 2018-10-04: 3000 [IU] via INTRAVENOUS

## 2018-10-04 MED ORDER — SODIUM CHLORIDE 0.9 % IV SOLN
INTRAVENOUS | Status: DC | PRN
  Administered 2018-10-04: 10:00:00

## 2018-10-04 MED ORDER — SODIUM CHLORIDE 0.9 % IV SOLN: INTRAVENOUS | Status: DC

## 2018-10-04 MED ORDER — LACTATED RINGERS IV SOLN: INTRAVENOUS | Status: DC

## 2018-10-04 MED ORDER — PROPOFOL 500 MG/50ML IV EMUL
INTRAVENOUS | Status: DC | PRN
  Administered 2018-10-04: 25 ug/kg/min via INTRAVENOUS

## 2018-10-04 MED ORDER — LIDOCAINE-EPINEPHRINE (PF) 1 %-1:200000 IJ SOLN
INTRAMUSCULAR | Status: DC | PRN
  Administered 2018-10-04: 30 mL

## 2018-10-04 MED ORDER — LIDOCAINE HCL (CARDIAC) PF 100 MG/5ML IV SOSY
PREFILLED_SYRINGE | INTRAVENOUS | Status: DC | PRN
  Administered 2018-10-04: 20 mg via INTRAVENOUS

## 2018-10-04 MED ORDER — KETAMINE HCL 50 MG/5ML IJ SOSY
PREFILLED_SYRINGE | INTRAMUSCULAR | Status: AC
  Filled 2018-10-04: qty 10

## 2018-10-04 MED ORDER — FENTANYL CITRATE (PF) 100 MCG/2ML IJ SOLN
INTRAMUSCULAR | Status: DC | PRN
  Administered 2018-10-04 (×2): 25 ug via INTRAVENOUS

## 2018-10-04 MED ORDER — 0.9 % SODIUM CHLORIDE (POUR BTL) OPTIME
TOPICAL | Status: DC | PRN
  Administered 2018-10-04: 1000 mL

## 2018-10-04 SURGICAL SUPPLY — 35 items
ARMBAND PINK RESTRICT EXTREMIT (MISCELLANEOUS) ×6 IMPLANT
CANISTER SUCT 3000ML PPV (MISCELLANEOUS) ×3 IMPLANT
CLIP VESOCCLUDE MED 6/CT (CLIP) ×3 IMPLANT
CLIP VESOCCLUDE SM WIDE 6/CT (CLIP) ×3 IMPLANT
COVER PROBE W GEL 5X96 (DRAPES) ×3 IMPLANT
COVER WAND RF STERILE (DRAPES) ×3 IMPLANT
DERMABOND ADVANCED (GAUZE/BANDAGES/DRESSINGS) ×2
DERMABOND ADVANCED .7 DNX12 (GAUZE/BANDAGES/DRESSINGS) ×1 IMPLANT
ELECT REM PT RETURN 9FT ADLT (ELECTROSURGICAL) ×3
ELECTRODE REM PT RTRN 9FT ADLT (ELECTROSURGICAL) ×1 IMPLANT
GLOVE BIO SURGEON STRL SZ 6.5 (GLOVE) ×4 IMPLANT
GLOVE BIO SURGEONS STRL SZ 6.5 (GLOVE) ×2
GLOVE BIOGEL PI IND STRL 6.5 (GLOVE) ×4 IMPLANT
GLOVE BIOGEL PI IND STRL 7.5 (GLOVE) ×1 IMPLANT
GLOVE BIOGEL PI INDICATOR 6.5 (GLOVE) ×8
GLOVE BIOGEL PI INDICATOR 7.5 (GLOVE) ×2
GLOVE SURG SS PI 7.5 STRL IVOR (GLOVE) ×3 IMPLANT
GOWN STRL REUS W/ TWL LRG LVL3 (GOWN DISPOSABLE) ×2 IMPLANT
GOWN STRL REUS W/ TWL XL LVL3 (GOWN DISPOSABLE) ×1 IMPLANT
GOWN STRL REUS W/TWL LRG LVL3 (GOWN DISPOSABLE) ×4
GOWN STRL REUS W/TWL XL LVL3 (GOWN DISPOSABLE) ×2
HEMOSTAT SNOW SURGICEL 2X4 (HEMOSTASIS) ×3 IMPLANT
KIT BASIN OR (CUSTOM PROCEDURE TRAY) ×3 IMPLANT
KIT TURNOVER KIT B (KITS) ×3 IMPLANT
NS IRRIG 1000ML POUR BTL (IV SOLUTION) ×3 IMPLANT
PACK CV ACCESS (CUSTOM PROCEDURE TRAY) ×3 IMPLANT
PAD ARMBOARD 7.5X6 YLW CONV (MISCELLANEOUS) ×6 IMPLANT
SUT PROLENE 5 0 CC1 (SUTURE) ×3 IMPLANT
SUT PROLENE 6 0 CC (SUTURE) ×3 IMPLANT
SUT VIC AB 3-0 SH 27 (SUTURE) ×2
SUT VIC AB 3-0 SH 27X BRD (SUTURE) ×1 IMPLANT
SUT VICRYL 4-0 PS2 18IN ABS (SUTURE) ×3 IMPLANT
TOWEL GREEN STERILE (TOWEL DISPOSABLE) ×3 IMPLANT
UNDERPAD 30X30 (UNDERPADS AND DIAPERS) ×3 IMPLANT
WATER STERILE IRR 1000ML POUR (IV SOLUTION) ×3 IMPLANT

## 2018-10-04 NOTE — Progress Notes (Signed)
Hypoglycemic Event  CBG: 60  Treatment: D50 IV 25 mL  Symptoms: None  Follow-up CBG: Time: 0900 CBG Result: 85  Possible Reasons for Event: Inadequate meal intake  Comments/MD notified: Dr. Marcell Barlow notified.  Received verbal order to give 1/2 amp D50.  Will continue to monitor patient.     Shaneece Stockburger R

## 2018-10-04 NOTE — Op Note (Signed)
    Patient name: Catherine Williams MRN: [id number] DOB: November 04, 1961 Sex: female  10/04/2018 Pre-operative Diagnosis: CKD Post-operative diagnosis:  Same Surgeon:  Annamarie Major Assistants:  Christa Procedure:   Left brachiocephalic fistula Anesthesia: MAC Blood Loss: Minimal Specimens: None  Findings: 3-4 mm vein.  The artery was moderately calcified circumferentially and measuring 4 mm.  Indications: Patient comes in today for dialysis access.  She is not yet on dialysis.  She understands that she is at risk for needing fistula elevation which would be performed at a later date.  Procedure:  The patient was identified in the holding area and taken to [place] [facility]  The patient was then placed supine on the table. MAC anesthesia was administered.  The patient was prepped and draped in the usual sterile fashion.  A time out was called and antibiotics were administered.  1% lidocaine was used for local anesthesia.  Ultrasound was used to evaluate the cephalic vein.  This appeared to be a pretty generous vein throughout the entire upper arm beginning at the antecubital crease.  I made a transverse incision at this level.  I first dissected out the brachial artery.  This was a moderately circumferentially calcified artery measuring about 4 mm.  I then dissected out the cephalic vein.  This was a 4-5 mm vein around the antecubital region.  It narrowed down to about 3 mm in the upper arm.  It was fully mobilized and then marked for orientation.  The vein was ligated distally with a 2-0 silk tie.  It flushed nicely with heparin saline.  The vein continue to course very deep under the skin, greater than 1 cm.  Next, 3000 units of heparin was given.  The brachial artery was occluded with baby Gregory clamps.  A #11 blade was used to make an arteriotomy which was extended longitudinally with Potts scissors.  The vein was cut the appropriate length and spatulated to fit the size of the arteriotomy.  A running  anastomosis was created with 6-0 Prolene.  Prior to completion, the appropriate flushing maneuvers were performed and the anastomosis was completed.  The patient had a triphasic radial and ulnar Doppler signal at the procedure and a palpable thrill within her fistula.  25 mg of protamine was then given.  Once hemostasis was satisfactory, the incision was closed with 2 layers of 3-0 Vicryl followed by Dermabond.  There were no immediate complications.   Disposition: To PACU stable   V. Annamarie Major, M.D. [facility] Office: [phone] Pager:  [phone]

## 2018-10-04 NOTE — Anesthesia Postprocedure Evaluation (Signed)
Anesthesia Post Note  Patient: Catherine Williams  Procedure(s) Performed: ARTERIOVENOUS (AV) FISTULA CREATION LEFT ARM (Left Arm Upper)     Patient location during evaluation: PACU Anesthesia Type: MAC Level of consciousness: awake and alert Pain management: pain level controlled Vital Signs Assessment: post-procedure vital signs reviewed and stable Respiratory status: spontaneous breathing, nonlabored ventilation, respiratory function stable and patient connected to nasal cannula oxygen Cardiovascular status: stable and blood pressure returned to baseline Postop Assessment: no apparent nausea or vomiting Anesthetic complications: no    Last Vitals:  Vitals:   10/04/18 1128 10/04/18 1148  BP: (!) 114/59 110/71  Pulse: 77 80  Resp: (!) 22   Temp:    SpO2: 95% 94%    Last Pain:  Vitals:   10/04/18 1148  TempSrc:   PainSc: 3                  Montez Hageman

## 2018-10-04 NOTE — Telephone Encounter (Signed)
-----   Message from Mena Goes, RN sent at 10/04/2018 11:24 AM EDT ----- Regarding: 4-6 weeks with duplex   ----- Message ----- From: Serafina Mitchell, MD Sent: 10/04/2018  10:48 AM EDT To: [facility] Charge Pool  10/04/2018:  Surgeon:  Annamarie Major Assistants:  Christa Procedure:   Left brachiocephalic fistula  Follow-up to see me in the office in 4 to 6 weeks with a duplex of her fistula.

## 2018-10-04 NOTE — Telephone Encounter (Signed)
sch appt lvm 11/12/18 3pm Dialysis duplex 345pm p/o MD

## 2018-10-04 NOTE — H&P (Signed)
   Patient name: Catherine Williams MRN: [id number] DOB: 20-Oct-1961 Sex: female    HISTORY OF PRESENT ILLNESS:   Catherine Williams is a 57 y.o. female, who is referred today for dialysis access.  The patient's renal failure secondary to diabetes hypertension, and obesity.  She is right-handed.  She also suffers from congestive heart failure and COPD, for which she is on home oxygen and CPAP.  She takes a statin for hypercholesterolemia.  She is a non-smoker  The patient has a history of peripheral vascular disease.  I performed atherectomy with angioplasty of her left posterior tibial artery for toe ulcer in 2012.  CURRENT MEDICATIONS:    Current Facility-Administered Medications  Medication Dose Route Frequency Provider Last Rate Last Dose  . 0.9 %  sodium chloride infusion   Intravenous Continuous Serafina Mitchell, MD      . ceFAZolin (ANCEF) 3 g in dextrose 5 % 50 mL IVPB  3 g Intravenous 30 min Pre-Op Serafina Mitchell, MD      . chlorhexidine (HIBICLENS) 4 % liquid 4 application  60 mL Topical Once Serafina Mitchell, MD       And  . Derrill Memo ON 10/05/2018] chlorhexidine (HIBICLENS) 4 % liquid 4 application  60 mL Topical Once Serafina Mitchell, MD        REVIEW OF SYSTEMS:   [X]  denotes positive finding, [ ]  denotes negative finding Cardiac  Comments:  Chest pain or chest pressure:    Shortness of breath upon exertion:    Short of breath when lying flat:    Irregular heart rhythm:    Constitutional    Fever or chills:      PHYSICAL EXAM:   Vitals:   10/04/18 0642 10/04/18 0643  BP:  (!) 160/61  Pulse: 73   Resp: 20   Temp: 98.2 F (36.8 C)   TempSrc: Oral   SpO2: 96%   Weight: (!) 143.3 kg   Height: 5\' 2"  (1.575 m)     GENERAL: The patient is a well-nourished female, in no acute distress. The vital signs are documented above. CARDIOVASCULAR: There is a regular rate and rhythm. PULMONARY: Non-labored respirations Palpable radial  pulse  STUDIES:   Vein map shows that her cephalic vein in the left is the best vein   MEDICAL ISSUES:   We again discussed proceeding with a left brachiocephalic fistula.  All of her questions were answered.  Annamarie Major, MD [facility] [phone] Pager [phone]

## 2018-10-04 NOTE — Transfer of Care (Signed)
Immediate Anesthesia Transfer of Care Note  Patient: Catherine Williams  Procedure(s) Performed: ARTERIOVENOUS (AV) FISTULA CREATION LEFT ARM (Left Arm Upper)  Patient Location: PACU  Anesthesia Type:MAC  Level of Consciousness: awake, alert  and oriented  Airway & Oxygen Therapy: Patient Spontanous Breathing and Patient connected to face mask oxygen  Post-op Assessment: Report given to RN, Post -op Vital signs reviewed and stable and Patient moving all extremities X 4  Post vital signs: Reviewed and stable  Last Vitals:  Vitals Value Taken Time  BP    Temp    Pulse 75 10/04/2018 10:57 AM  Resp 17 10/04/2018 10:57 AM  SpO2 100 % 10/04/2018 10:57 AM  Vitals shown include unvalidated device data.  Last Pain:  Vitals:   10/04/18 0718  TempSrc:   PainSc: 0-No pain      Patients Stated Pain Goal: 1 (63/89/37 3428)  Complications: No apparent anesthesia complications

## 2018-10-04 NOTE — Progress Notes (Signed)
Hypoglycemic Event  CBG: 45  Treatment: D50 IV 25 mL  Symptoms: Hungry  Follow-up CBG: Time:  0720 CBG Result: 79  Possible Reasons for Event: Inadequate meal intake  Comments/MD notified: Notified Dr. Marcell Barlow, received verbal order to give 1/2 amp D50.   Will continue to monitor    Nicholus Chandran R

## 2018-10-04 NOTE — Anesthesia Preprocedure Evaluation (Addendum)
Anesthesia Evaluation  Patient identified by MRN, date of birth, ID band Patient awake    Reviewed: Allergy & Precautions, NPO status , Patient's Chart, lab work & pertinent test results  Airway Mallampati: II  TM Distance: >3 FB Neck ROM: Full    Dental no notable dental hx.    Pulmonary asthma , sleep apnea and Continuous Positive Airway Pressure Ventilation , COPD,  COPD inhaler,    Pulmonary exam normal breath sounds clear to auscultation       Cardiovascular hypertension, Pt. on medications +CHF  Normal cardiovascular exam Rhythm:Regular Rate:Normal     Neuro/Psych negative neurological ROS  negative psych ROS   GI/Hepatic negative GI ROS, Neg liver ROS,   Endo/Other  diabetes, Type 2, Insulin Dependent  Renal/GU negative Renal ROS  negative genitourinary   Musculoskeletal negative musculoskeletal ROS (+)   Abdominal   Peds negative pediatric ROS (+)  Hematology negative hematology ROS (+)   Anesthesia Other Findings   Reproductive/Obstetrics negative OB ROS                            Anesthesia Physical Anesthesia Plan  ASA: III  Anesthesia Plan: MAC   Post-op Pain Management:    Induction:   PONV Risk Score and Plan: 2  Airway Management Planned: Simple Face Mask  Additional Equipment:   Intra-op Plan:   Post-operative Plan:   Informed Consent: I have reviewed the patients History and Physical, chart, labs and discussed the procedure including the risks, benefits and alternatives for the proposed anesthesia with the patient or authorized representative who has indicated his/her understanding and acceptance.   Dental advisory given  Plan Discussed with:   Anesthesia Plan Comments:         Anesthesia Quick Evaluation

## 2018-10-05 ENCOUNTER — Encounter (HOSPITAL_COMMUNITY): Payer: Self-pay | Admitting: Surgery

## 2018-10-07 ENCOUNTER — Inpatient Hospital Stay (HOSPITAL_COMMUNITY): Payer: Medicare Other

## 2018-10-07 ENCOUNTER — Emergency Department (HOSPITAL_COMMUNITY): Payer: Medicare Other

## 2018-10-07 ENCOUNTER — Encounter (HOSPITAL_COMMUNITY): Payer: Self-pay | Admitting: Emergency Medicine

## 2018-10-07 ENCOUNTER — Inpatient Hospital Stay (HOSPITAL_COMMUNITY)
Admission: EM | Admit: 2018-10-07 | Discharge: 2018-11-01 | DRG: 003 | Disposition: A | Discharge status: Other Acute Inpatient Hospital | Payer: Medicare Other | Attending: Internal Medicine | Admitting: Internal Medicine

## 2018-10-07 DIAGNOSIS — Z8249 Family history of ischemic heart disease and other diseases of the circulatory system: Secondary | ICD-10-CM

## 2018-10-07 DIAGNOSIS — J9690 Respiratory failure, unspecified, unspecified whether with hypoxia or hypercapnia: Secondary | ICD-10-CM | POA: Diagnosis not present

## 2018-10-07 DIAGNOSIS — N189 Chronic kidney disease, unspecified: Secondary | ICD-10-CM | POA: Diagnosis not present

## 2018-10-07 DIAGNOSIS — G9340 Encephalopathy, unspecified: Secondary | ICD-10-CM | POA: Diagnosis present

## 2018-10-07 DIAGNOSIS — I129 Hypertensive chronic kidney disease with stage 1 through stage 4 chronic kidney disease, or unspecified chronic kidney disease: Secondary | ICD-10-CM | POA: Diagnosis not present

## 2018-10-07 DIAGNOSIS — Z430 Encounter for attention to tracheostomy: Secondary | ICD-10-CM | POA: Diagnosis not present

## 2018-10-07 DIAGNOSIS — J189 Pneumonia, unspecified organism: Secondary | ICD-10-CM | POA: Diagnosis not present

## 2018-10-07 DIAGNOSIS — R0902 Hypoxemia: Secondary | ICD-10-CM | POA: Diagnosis not present

## 2018-10-07 DIAGNOSIS — I5033 Acute on chronic diastolic (congestive) heart failure: Secondary | ICD-10-CM | POA: Diagnosis present

## 2018-10-07 DIAGNOSIS — I132 Hypertensive heart and chronic kidney disease with heart failure and with stage 5 chronic kidney disease, or end stage renal disease: Principal | ICD-10-CM | POA: Diagnosis present

## 2018-10-07 DIAGNOSIS — Z6841 Body Mass Index (BMI) 40.0 and over, adult: Secondary | ICD-10-CM | POA: Diagnosis not present

## 2018-10-07 DIAGNOSIS — Z992 Dependence on renal dialysis: Secondary | ICD-10-CM

## 2018-10-07 DIAGNOSIS — N179 Acute kidney failure, unspecified: Secondary | ICD-10-CM

## 2018-10-07 DIAGNOSIS — B9689 Other specified bacterial agents as the cause of diseases classified elsewhere: Secondary | ICD-10-CM | POA: Diagnosis not present

## 2018-10-07 DIAGNOSIS — I2510 Atherosclerotic heart disease of native coronary artery without angina pectoris: Secondary | ICD-10-CM | POA: Diagnosis present

## 2018-10-07 DIAGNOSIS — N170 Acute kidney failure with tubular necrosis: Secondary | ICD-10-CM | POA: Diagnosis not present

## 2018-10-07 DIAGNOSIS — J9809 Other diseases of bronchus, not elsewhere classified: Secondary | ICD-10-CM

## 2018-10-07 DIAGNOSIS — R0989 Other specified symptoms and signs involving the circulatory and respiratory systems: Secondary | ICD-10-CM | POA: Diagnosis not present

## 2018-10-07 DIAGNOSIS — N185 Chronic kidney disease, stage 5: Secondary | ICD-10-CM | POA: Diagnosis present

## 2018-10-07 DIAGNOSIS — I4901 Ventricular fibrillation: Secondary | ICD-10-CM | POA: Diagnosis present

## 2018-10-07 DIAGNOSIS — Z23 Encounter for immunization: Secondary | ICD-10-CM

## 2018-10-07 DIAGNOSIS — I499 Cardiac arrhythmia, unspecified: Secondary | ICD-10-CM | POA: Diagnosis not present

## 2018-10-07 DIAGNOSIS — I5030 Unspecified diastolic (congestive) heart failure: Secondary | ICD-10-CM | POA: Diagnosis not present

## 2018-10-07 DIAGNOSIS — E871 Hypo-osmolality and hyponatremia: Secondary | ICD-10-CM | POA: Diagnosis present

## 2018-10-07 DIAGNOSIS — E1151 Type 2 diabetes mellitus with diabetic peripheral angiopathy without gangrene: Secondary | ICD-10-CM | POA: Diagnosis present

## 2018-10-07 DIAGNOSIS — E874 Mixed disorder of acid-base balance: Secondary | ICD-10-CM | POA: Diagnosis not present

## 2018-10-07 DIAGNOSIS — G4733 Obstructive sleep apnea (adult) (pediatric): Secondary | ICD-10-CM | POA: Diagnosis present

## 2018-10-07 DIAGNOSIS — R0689 Other abnormalities of breathing: Secondary | ICD-10-CM | POA: Diagnosis not present

## 2018-10-07 DIAGNOSIS — D62 Acute posthemorrhagic anemia: Secondary | ICD-10-CM | POA: Diagnosis not present

## 2018-10-07 DIAGNOSIS — K5900 Constipation, unspecified: Secondary | ICD-10-CM | POA: Diagnosis not present

## 2018-10-07 DIAGNOSIS — J440 Chronic obstructive pulmonary disease with acute lower respiratory infection: Secondary | ICD-10-CM | POA: Diagnosis not present

## 2018-10-07 DIAGNOSIS — Z978 Presence of other specified devices: Secondary | ICD-10-CM

## 2018-10-07 DIAGNOSIS — D6489 Other specified anemias: Secondary | ICD-10-CM | POA: Diagnosis present

## 2018-10-07 DIAGNOSIS — Y95 Nosocomial condition: Secondary | ICD-10-CM | POA: Diagnosis not present

## 2018-10-07 DIAGNOSIS — J9601 Acute respiratory failure with hypoxia: Secondary | ICD-10-CM | POA: Diagnosis not present

## 2018-10-07 DIAGNOSIS — J9622 Acute and chronic respiratory failure with hypercapnia: Secondary | ICD-10-CM | POA: Diagnosis not present

## 2018-10-07 DIAGNOSIS — J9602 Acute respiratory failure with hypercapnia: Secondary | ICD-10-CM | POA: Diagnosis not present

## 2018-10-07 DIAGNOSIS — M109 Gout, unspecified: Secondary | ICD-10-CM | POA: Diagnosis present

## 2018-10-07 DIAGNOSIS — J9621 Acute and chronic respiratory failure with hypoxia: Secondary | ICD-10-CM | POA: Diagnosis not present

## 2018-10-07 DIAGNOSIS — Z452 Encounter for adjustment and management of vascular access device: Secondary | ICD-10-CM

## 2018-10-07 DIAGNOSIS — J9600 Acute respiratory failure, unspecified whether with hypoxia or hypercapnia: Secondary | ICD-10-CM | POA: Diagnosis not present

## 2018-10-07 DIAGNOSIS — E875 Hyperkalemia: Secondary | ICD-10-CM | POA: Diagnosis not present

## 2018-10-07 DIAGNOSIS — I472 Ventricular tachycardia: Secondary | ICD-10-CM | POA: Diagnosis present

## 2018-10-07 DIAGNOSIS — R570 Cardiogenic shock: Secondary | ICD-10-CM | POA: Diagnosis not present

## 2018-10-07 DIAGNOSIS — E1122 Type 2 diabetes mellitus with diabetic chronic kidney disease: Secondary | ICD-10-CM | POA: Diagnosis present

## 2018-10-07 DIAGNOSIS — K219 Gastro-esophageal reflux disease without esophagitis: Secondary | ICD-10-CM | POA: Diagnosis present

## 2018-10-07 DIAGNOSIS — N186 End stage renal disease: Secondary | ICD-10-CM | POA: Diagnosis not present

## 2018-10-07 DIAGNOSIS — I471 Supraventricular tachycardia: Secondary | ICD-10-CM | POA: Diagnosis present

## 2018-10-07 DIAGNOSIS — Z9289 Personal history of other medical treatment: Secondary | ICD-10-CM

## 2018-10-07 DIAGNOSIS — J989 Respiratory disorder, unspecified: Secondary | ICD-10-CM

## 2018-10-07 DIAGNOSIS — R9401 Abnormal electroencephalogram [EEG]: Secondary | ICD-10-CM | POA: Diagnosis present

## 2018-10-07 DIAGNOSIS — Z4901 Encounter for fitting and adjustment of extracorporeal dialysis catheter: Secondary | ICD-10-CM | POA: Diagnosis not present

## 2018-10-07 DIAGNOSIS — Z9981 Dependence on supplemental oxygen: Secondary | ICD-10-CM

## 2018-10-07 DIAGNOSIS — Z801 Family history of malignant neoplasm of trachea, bronchus and lung: Secondary | ICD-10-CM

## 2018-10-07 DIAGNOSIS — Z930 Tracheostomy status: Secondary | ICD-10-CM | POA: Diagnosis not present

## 2018-10-07 DIAGNOSIS — D631 Anemia in chronic kidney disease: Secondary | ICD-10-CM | POA: Diagnosis present

## 2018-10-07 DIAGNOSIS — N390 Urinary tract infection, site not specified: Secondary | ICD-10-CM | POA: Diagnosis not present

## 2018-10-07 DIAGNOSIS — I509 Heart failure, unspecified: Secondary | ICD-10-CM | POA: Diagnosis not present

## 2018-10-07 DIAGNOSIS — L8990 Pressure ulcer of unspecified site, unspecified stage: Secondary | ICD-10-CM

## 2018-10-07 DIAGNOSIS — N938 Other specified abnormal uterine and vaginal bleeding: Secondary | ICD-10-CM | POA: Diagnosis not present

## 2018-10-07 DIAGNOSIS — T17590A Other foreign object in bronchus causing asphyxiation, initial encounter: Secondary | ICD-10-CM | POA: Diagnosis not present

## 2018-10-07 DIAGNOSIS — J9811 Atelectasis: Secondary | ICD-10-CM | POA: Diagnosis not present

## 2018-10-07 DIAGNOSIS — R404 Transient alteration of awareness: Secondary | ICD-10-CM | POA: Diagnosis not present

## 2018-10-07 DIAGNOSIS — E162 Hypoglycemia, unspecified: Secondary | ICD-10-CM | POA: Diagnosis not present

## 2018-10-07 DIAGNOSIS — E785 Hyperlipidemia, unspecified: Secondary | ICD-10-CM | POA: Diagnosis present

## 2018-10-07 DIAGNOSIS — G931 Anoxic brain damage, not elsewhere classified: Secondary | ICD-10-CM | POA: Diagnosis not present

## 2018-10-07 DIAGNOSIS — Z7982 Long term (current) use of aspirin: Secondary | ICD-10-CM

## 2018-10-07 DIAGNOSIS — I469 Cardiac arrest, cause unspecified: Secondary | ICD-10-CM | POA: Diagnosis not present

## 2018-10-07 DIAGNOSIS — Z4682 Encounter for fitting and adjustment of non-vascular catheter: Secondary | ICD-10-CM | POA: Diagnosis not present

## 2018-10-07 DIAGNOSIS — Z794 Long term (current) use of insulin: Secondary | ICD-10-CM

## 2018-10-07 DIAGNOSIS — L89152 Pressure ulcer of sacral region, stage 2: Secondary | ICD-10-CM | POA: Diagnosis not present

## 2018-10-07 DIAGNOSIS — E8770 Fluid overload, unspecified: Secondary | ICD-10-CM | POA: Diagnosis not present

## 2018-10-07 DIAGNOSIS — E161 Other hypoglycemia: Secondary | ICD-10-CM | POA: Diagnosis not present

## 2018-10-07 DIAGNOSIS — Z9911 Dependence on respirator [ventilator] status: Secondary | ICD-10-CM | POA: Diagnosis not present

## 2018-10-07 DIAGNOSIS — R609 Edema, unspecified: Secondary | ICD-10-CM | POA: Diagnosis not present

## 2018-10-07 DIAGNOSIS — D259 Leiomyoma of uterus, unspecified: Secondary | ICD-10-CM | POA: Diagnosis not present

## 2018-10-07 DIAGNOSIS — R4182 Altered mental status, unspecified: Secondary | ICD-10-CM | POA: Diagnosis not present

## 2018-10-07 DIAGNOSIS — T17500A Unspecified foreign body in bronchus causing asphyxiation, initial encounter: Secondary | ICD-10-CM

## 2018-10-07 DIAGNOSIS — Z832 Family history of diseases of the blood and blood-forming organs and certain disorders involving the immune mechanism: Secondary | ICD-10-CM

## 2018-10-07 DIAGNOSIS — Z833 Family history of diabetes mellitus: Secondary | ICD-10-CM

## 2018-10-07 DIAGNOSIS — R34 Anuria and oliguria: Secondary | ICD-10-CM | POA: Diagnosis not present

## 2018-10-07 DIAGNOSIS — E1165 Type 2 diabetes mellitus with hyperglycemia: Secondary | ICD-10-CM | POA: Diagnosis not present

## 2018-10-07 LAB — I-STAT CHEM 8, ED
BUN: 67 mg/dL — AB (ref 6–20)
CHLORIDE: 109 mmol/L (ref 98–111)
CREATININE: 4.8 mg/dL — AB (ref 0.44–1.00)
Calcium, Ion: 1.21 mmol/L (ref 1.15–1.40)
GLUCOSE: 75 mg/dL (ref 70–99)
HEMATOCRIT: 33 % — AB (ref 36.0–46.0)
HEMOGLOBIN: 11.2 g/dL — AB (ref 12.0–15.0)
POTASSIUM: 4.2 mmol/L (ref 3.5–5.1)
Sodium: 144 mmol/L (ref 135–145)
TCO2: 26 mmol/L (ref 22–32)

## 2018-10-07 LAB — URINALYSIS, ROUTINE W REFLEX MICROSCOPIC
Bilirubin Urine: NEGATIVE
Glucose, UA: NEGATIVE mg/dL
Hgb urine dipstick: NEGATIVE
Ketones, ur: NEGATIVE mg/dL
Leukocytes, UA: NEGATIVE
Nitrite: NEGATIVE
Protein, ur: 100 mg/dL — AB
Specific Gravity, Urine: 1.01 (ref 1.005–1.030)
pH: 5 (ref 5.0–8.0)

## 2018-10-07 LAB — COMPREHENSIVE METABOLIC PANEL
ALK PHOS: 79 U/L (ref 38–126)
ALT: 25 U/L (ref 0–44)
ANION GAP: 10 (ref 5–15)
AST: 52 U/L — ABNORMAL HIGH (ref 15–41)
Albumin: 3.2 g/dL — ABNORMAL LOW (ref 3.5–5.0)
BUN: 68 mg/dL — ABNORMAL HIGH (ref 6–20)
CO2: 22 mmol/L (ref 22–32)
Calcium: 9.6 mg/dL (ref 8.9–10.3)
Chloride: 109 mmol/L (ref 98–111)
Creatinine, Ser: 4.16 mg/dL — ABNORMAL HIGH (ref 0.44–1.00)
GFR, EST AFRICAN AMERICAN: 12 mL/min — AB (ref >60)
GFR, EST NON AFRICAN AMERICAN: 11 mL/min — AB (ref >60)
Glucose, Bld: 75 mg/dL (ref 70–99)
Potassium: 4.2 mmol/L (ref 3.5–5.1)
SODIUM: 141 mmol/L (ref 135–145)
Total Bilirubin: 0.5 mg/dL (ref 0.3–1.2)
Total Protein: 7.4 g/dL (ref 6.5–8.1)

## 2018-10-07 LAB — CBG MONITORING, ED: Glucose-Capillary: 78 mg/dL (ref 70–99)

## 2018-10-07 LAB — CBC WITH DIFFERENTIAL/PLATELET
Abs Immature Granulocytes: 0.15 10*3/uL — ABNORMAL HIGH (ref 0.00–0.07)
BASOS ABS: 0 10*3/uL (ref 0.0–0.1)
BASOS PCT: 0 %
EOS ABS: 0.1 10*3/uL (ref 0.0–0.5)
EOS PCT: 1 %
HCT: 36.3 % (ref 36.0–46.0)
Hemoglobin: 10.2 g/dL — ABNORMAL LOW (ref 12.0–15.0)
Immature Granulocytes: 2 %
LYMPHS PCT: 17 %
Lymphs Abs: 1.8 10*3/uL (ref 0.7–4.0)
MCH: 25.3 pg — AB (ref 26.0–34.0)
MCHC: 28.1 g/dL — AB (ref 30.0–36.0)
MCV: 90.1 fL (ref 80.0–100.0)
MONO ABS: 0.6 10*3/uL (ref 0.1–1.0)
Monocytes Relative: 6 %
NRBC: 0 % (ref 0.0–0.2)
Neutro Abs: 7.7 10*3/uL (ref 1.7–7.7)
Neutrophils Relative %: 74 %
PLATELETS: 227 10*3/uL (ref 150–400)
RBC: 4.03 MIL/uL (ref 3.87–5.11)
RDW: 15.5 % (ref 11.5–15.5)
WBC: 10.3 10*3/uL (ref 4.0–10.5)

## 2018-10-07 LAB — RAPID URINE DRUG SCREEN, HOSP PERFORMED
Amphetamines: NOT DETECTED
Barbiturates: NOT DETECTED
Benzodiazepines: NOT DETECTED
Cocaine: NOT DETECTED
Opiates: NOT DETECTED
Tetrahydrocannabinol: NOT DETECTED

## 2018-10-07 LAB — I-STAT ARTERIAL BLOOD GAS, ED
ACID-BASE EXCESS: 2 mmol/L (ref 0.0–2.0)
Bicarbonate: 29.5 mmol/L — ABNORMAL HIGH (ref 20.0–28.0)
O2 SAT: 100 %
PCO2 ART: 63.2 mmHg — AB (ref 32.0–48.0)
PH ART: 7.277 — AB (ref 7.350–7.450)
PO2 ART: 322 mmHg — AB (ref 83.0–108.0)
Patient temperature: 98.4
TCO2: 31 mmol/L (ref 22–32)

## 2018-10-07 LAB — SALICYLATE LEVEL: Salicylate Lvl: <7 mg/dL (ref 2.8–30.0)

## 2018-10-07 LAB — POCT I-STAT 3, ART BLOOD GAS (G3+)
ACID-BASE DEFICIT: 2 mmol/L (ref 0.0–2.0)
Bicarbonate: 25.5 mmol/L (ref 20.0–28.0)
O2 SAT: 95 %
PO2 ART: 81 mmHg — AB (ref 83.0–108.0)
TCO2: 27 mmol/L (ref 22–32)
pCO2 arterial: 51 mmHg — ABNORMAL HIGH (ref 32.0–48.0)
pH, Arterial: 7.299 — ABNORMAL LOW (ref 7.350–7.450)

## 2018-10-07 LAB — I-STAT TROPONIN, ED: Troponin i, poc: 0.03 ng/mL (ref 0.00–0.08)

## 2018-10-07 LAB — ETHANOL

## 2018-10-07 LAB — COOXEMETRY PANEL
Carboxyhemoglobin: 1.2 % (ref 0.5–1.5)
METHEMOGLOBIN: 1.8 % — AB (ref 0.0–1.5)
O2 SAT: 90.5 %
TOTAL HEMOGLOBIN: 10.5 g/dL — AB (ref 12.0–16.0)

## 2018-10-07 LAB — I-STAT BETA HCG BLOOD, ED (MC, WL, AP ONLY)

## 2018-10-07 LAB — PROTIME-INR
INR: 1.08
PROTHROMBIN TIME: 13.9 s (ref 11.4–15.2)

## 2018-10-07 LAB — ACETAMINOPHEN LEVEL: Acetaminophen (Tylenol), Serum: <10 ug/mL — ABNORMAL LOW (ref 10–30)

## 2018-10-07 LAB — I-STAT CG4 LACTIC ACID, ED: LACTIC ACID, VENOUS: 1.38 mmol/L (ref 0.5–1.9)

## 2018-10-07 MED ORDER — FUROSEMIDE 10 MG/ML IJ SOLN
60.0000 mg | Freq: Once | INTRAMUSCULAR | Status: AC
  Administered 2018-10-07: 60 mg via INTRAVENOUS
  Filled 2018-10-07: qty 6

## 2018-10-07 MED ORDER — CISATRACURIUM BOLUS VIA INFUSION
0.1000 mg/kg | Freq: Once | INTRAVENOUS | Status: AC
  Administered 2018-10-07: 14.3 mg via INTRAVENOUS
  Filled 2018-10-07: qty 15

## 2018-10-07 MED ORDER — CISATRACURIUM BOLUS VIA INFUSION
0.0500 mg/kg | INTRAVENOUS | Status: DC | PRN
  Filled 2018-10-07: qty 8

## 2018-10-07 MED ORDER — AMLODIPINE BESYLATE 5 MG PO TABS: 5.0000 mg | ORAL_TABLET | Freq: Every day | ORAL | Status: DC

## 2018-10-07 MED ORDER — MIDAZOLAM HCL 2 MG/2ML IJ SOLN
2.0000 mg | Freq: Once | INTRAMUSCULAR | Status: DC
  Filled 2018-10-07: qty 2

## 2018-10-07 MED ORDER — INSULIN ASPART 100 UNIT/ML ~~LOC~~ SOLN: 1.0000 [IU] | SUBCUTANEOUS | Status: DC

## 2018-10-07 MED ORDER — PANTOPRAZOLE SODIUM 40 MG PO PACK
40.0000 mg | PACK | Freq: Every day | ORAL | Status: DC
  Administered 2018-10-08 – 2018-11-01 (×25): 40 mg
  Filled 2018-10-07 (×26): qty 20

## 2018-10-07 MED ORDER — HEPARIN BOLUS VIA INFUSION
2600.0000 [IU] | Freq: Once | INTRAVENOUS | Status: AC
  Administered 2018-10-08: 2600 [IU] via INTRAVENOUS
  Filled 2018-10-07: qty 2600

## 2018-10-07 MED ORDER — MIDAZOLAM BOLUS VIA INFUSION
2.0000 mg | INTRAVENOUS | Status: DC | PRN
  Filled 2018-10-07: qty 2

## 2018-10-07 MED ORDER — FENTANYL 2500MCG IN NS 250ML (10MCG/ML) PREMIX INFUSION
100.0000 ug/h | INTRAVENOUS | Status: DC
  Administered 2018-10-08 (×2): 300 ug/h via INTRAVENOUS
  Administered 2018-10-08: 200 ug/h via INTRAVENOUS
  Administered 2018-10-09: 300 ug/h via INTRAVENOUS
  Administered 2018-10-11: 75 ug/h via INTRAVENOUS
  Filled 2018-10-07 (×7): qty 250

## 2018-10-07 MED ORDER — ROSUVASTATIN CALCIUM 40 MG PO TABS
40.0000 mg | ORAL_TABLET | Freq: Every evening | ORAL | Status: DC
  Administered 2018-10-08 – 2018-10-12 (×5): 40 mg
  Filled 2018-10-07 (×5): qty 1

## 2018-10-07 MED ORDER — ASPIRIN 81 MG PO CHEW
81.0000 mg | CHEWABLE_TABLET | Freq: Every day | ORAL | Status: DC
  Administered 2018-10-08 – 2018-10-28 (×21): 81 mg via ORAL
  Filled 2018-10-07 (×22): qty 1

## 2018-10-07 MED ORDER — PROPOFOL 1000 MG/100ML IV EMUL: 5.0000 ug/kg/min | INTRAVENOUS | Status: DC

## 2018-10-07 MED ORDER — FENTANYL CITRATE (PF) 100 MCG/2ML IJ SOLN
100.0000 ug | Freq: Once | INTRAMUSCULAR | Status: DC
  Filled 2018-10-07: qty 2

## 2018-10-07 MED ORDER — ARTIFICIAL TEARS OPHTHALMIC OINT
1.0000 "application " | TOPICAL_OINTMENT | Freq: Three times a day (TID) | OPHTHALMIC | Status: DC
  Administered 2018-10-07 – 2018-10-11 (×8): 1 via OPHTHALMIC
  Filled 2018-10-07 (×2): qty 3.5

## 2018-10-07 MED ORDER — CARVEDILOL 6.25 MG PO TABS
6.2500 mg | ORAL_TABLET | Freq: Two times a day (BID) | ORAL | Status: DC
  Administered 2018-10-09 – 2018-10-13 (×8): 6.25 mg via ORAL
  Filled 2018-10-07 (×9): qty 1

## 2018-10-07 MED ORDER — SODIUM CHLORIDE 0.9 % IV SOLN
1.0000 ug/kg/min | INTRAVENOUS | Status: DC
  Administered 2018-10-07: 1 ug/kg/min via INTRAVENOUS
  Administered 2018-10-08 – 2018-10-09 (×2): 1.5 ug/kg/min via INTRAVENOUS
  Filled 2018-10-07 (×4): qty 20

## 2018-10-07 MED ORDER — FENTANYL 2500MCG IN NS 250ML (10MCG/ML) PREMIX INFUSION: 0.0000 ug/h | INTRAVENOUS | Status: DC

## 2018-10-07 MED ORDER — SODIUM CHLORIDE 0.9 % IV SOLN
2.0000 mg/h | INTRAVENOUS | Status: DC
  Administered 2018-10-07: 2 mg/h via INTRAVENOUS
  Administered 2018-10-08: 6 mg/h via INTRAVENOUS
  Administered 2018-10-08: 10 mg/h via INTRAVENOUS
  Administered 2018-10-08: 6 mg/h via INTRAVENOUS
  Administered 2018-10-08: 8 mg/h via INTRAVENOUS
  Administered 2018-10-09: 6 mg/h via INTRAVENOUS
  Filled 2018-10-07 (×7): qty 10

## 2018-10-07 MED ORDER — HEPARIN SODIUM (PORCINE) 5000 UNIT/ML IJ SOLN: 5000.0000 [IU] | Freq: Three times a day (TID) | INTRAMUSCULAR | Status: DC

## 2018-10-07 MED ORDER — ROSUVASTATIN CALCIUM 40 MG PO TABS: 40.0000 mg | ORAL_TABLET | Freq: Every evening | ORAL | Status: DC

## 2018-10-07 MED ORDER — IPRATROPIUM-ALBUTEROL 0.5-2.5 (3) MG/3ML IN SOLN
3.0000 mL | Freq: Four times a day (QID) | RESPIRATORY_TRACT | Status: DC
  Administered 2018-10-07 – 2018-10-31 (×96): 3 mL via RESPIRATORY_TRACT
  Filled 2018-10-07 (×65): qty 3
  Filled 2018-10-07: qty 42
  Filled 2018-10-07 (×30): qty 3

## 2018-10-07 MED ORDER — FENTANYL BOLUS VIA INFUSION
50.0000 ug | INTRAVENOUS | Status: DC | PRN
  Administered 2018-10-10 – 2018-10-11 (×5): 50 ug via INTRAVENOUS
  Filled 2018-10-07: qty 50

## 2018-10-07 MED ORDER — HEPARIN (PORCINE) IN NACL 100-0.45 UNIT/ML-% IJ SOLN
1200.0000 [IU]/h | INTRAMUSCULAR | Status: DC
  Administered 2018-10-08: 700 [IU]/h via INTRAVENOUS
  Administered 2018-10-08 – 2018-10-09 (×2): 1200 [IU]/h via INTRAVENOUS
  Filled 2018-10-07 (×3): qty 250

## 2018-10-07 MED ORDER — NOREPINEPHRINE 4 MG/250ML-% IV SOLN
0.0000 ug/min | INTRAVENOUS | Status: DC
  Administered 2018-10-08: 3 ug/min via INTRAVENOUS
  Filled 2018-10-07: qty 250

## 2018-10-07 MED ORDER — NICARDIPINE HCL IN NACL 20-0.86 MG/200ML-% IV SOLN
3.0000 mg/h | INTRAVENOUS | Status: DC
  Administered 2018-10-08: 3 mg/h via INTRAVENOUS
  Filled 2018-10-07: qty 200

## 2018-10-07 MED ORDER — SODIUM CHLORIDE 0.9 % IV SOLN
INTRAVENOUS | Status: DC
  Administered 2018-10-09 – 2018-10-17 (×3): via INTRAVENOUS

## 2018-10-07 NOTE — ED Notes (Signed)
Family at bedside. 

## 2018-10-07 NOTE — ED Provider Notes (Signed)
[facility] EMERGENCY DEPARTMENT Provider Note   CSN: [id number] Arrival date & time: 10/07/18  1929     History   Chief Complaint Chief Complaint  Patient presents with  . Post arrest    HPI Catherine Williams is a 57 y.o. female.  HPI   Patient is a 57 year old female with PMHx of HFpEF, COPD, CKD with recent placement of LAVF on 10/04/18, morbid obesity, and OSA on CPAP who presents from home via EMS s/p witnessed cardiac arrest at 1830 this evening without bystander CPR for 10 minutes. On fire department arrival, an AED was placed and she received 2 shocks.  She then received subsequent CPR for 30 minutes by EMS as she was in PEA.  ROSC obtained at 1900.  No medications given.  King airway in place.  Per family reports she was otherwise in her normal state of health this afternoon.  While watching TV this evening she was noted to be breathing funny when she slumped over in her chair.  She became unresponsive and 911 was called.  Level 5 caveat secondary to unresponsiveness.  Past Medical History:  Diagnosis Date  . Anemia   . Arthritis Dec. 2014   Gout  . Asthma   . CHF (congestive heart failure) ([place])   . COPD (chronic obstructive pulmonary disease) ([employer])   . Diabetes mellitus   . GERD (gastroesophageal reflux disease)   . Heart failure   . Hyperlipidemia   . Hypertension   . Kidney disease   . Morbid obesity ([place])   . Peripheral vascular disease ([place])   . Pinched nerve   . Requires supplemental oxygen    as needed  . Sleep apnea    wears cpap  . Wears dentures     Patient Active Problem List   Diagnosis Date Noted  . Cardiac arrest ([place]) 10/07/2018  . Hypertensive urgency 05/13/2018  . Fall 02/08/2018  . Chronic respiratory failure with hypoxia ([place]) 02/08/2018  . Neuropathy 10/31/2016  . Shortness of breath 10/23/2016  . Acute on chronic diastolic CHF (congestive heart failure) ([place]) 10/23/2016  . GERD (gastroesophageal reflux disease)  10/23/2016  . Severe obesity (BMI >= 40) ([place]) 08/28/2015  . Dyspnea 08/27/2015  . CKD (chronic kidney disease), stage IV ([place]) 07/17/2015  . Insulin-requiring or dependent type II diabetes mellitus ([place]) 07/17/2015  . Chronic anemia 07/17/2015  . Hypertension 07/17/2015  . Peripheral vascular disease, unspecified ([place]) 05/07/2012  . OSA (obstructive sleep apnea) 04/05/2011    Past Surgical History:  Procedure Laterality Date  . Atherectomy and angioplasty  10/18/2011   left posterior tibial artery  . AV FISTULA PLACEMENT Left 10/04/2018   Procedure: ARTERIOVENOUS (AV) FISTULA CREATION LEFT ARM;  Surgeon: Serafina Mitchell, MD;  Location: [facility];  Service: Vascular;  Laterality: Left;  . COLONOSCOPY    . HAND SURGERY     right  . MULTIPLE TOOTH EXTRACTIONS    . OVARY SURGERY     Left  . TOE AMPUTATION  Sept. 25,2012   Left 4th and 5th toes     OB History   None      Home Medications    Prior to Admission medications   Medication Sig Start Date End Date Taking? Authorizing Provider  albuterol (PROVENTIL HFA;VENTOLIN HFA) 108 (90 BASE) MCG/ACT inhaler Inhale 1-2 puffs into the lungs every 6 (six) hours as needed for wheezing. 07/25/13   Harden Mo, MD  allopurinol (ZYLOPRIM) 100 MG tablet Take 1 tablet (  100 mg total) by mouth at bedtime. 07/16/18   Jessy Oto, MD  amLODipine (NORVASC) 5 MG tablet Take 1 tablet (5 mg total) by mouth daily. 05/19/18   Roxan Hockey, MD  aspirin 81 MG tablet Take 81 mg by mouth daily.      [provider]  carvedilol (COREG) 6.25 MG tablet Take 1 tablet (6.25 mg total) by mouth 2 (two) times daily with a meal. 05/18/18   Denton Brick, Courage, MD  Darbepoetin Alfa (ARANESP) 60 MCG/0.3ML SOSY injection Inject 0.3 mLs (60 mcg total) into the skin every Thursday at 6pm. Patient taking differently: Inject 60 mcg into the skin every 30 (thirty) days.  11/03/16   Doreatha Lew, MD  ferrous sulfate 325 (65 FE) MG tablet Take 1 tablet  (325 mg total) by mouth 2 (two) times daily with a meal. 11/03/16   Patrecia Pour, Christean Grief, MD  furosemide (LASIX) 80 MG tablet Take 1 tablet (80 mg)  in the morning, and 1/2 tablet (40 mg)  in the evening Patient taking differently: Take 80 mg by mouth 2 (two) times daily.  05/18/18   Roxan Hockey, MD  HYDROcodone-acetaminophen (NORCO/VICODIN) 5-325 MG tablet Take 1 tablet by mouth every 6 (six) hours as needed. Patient taking differently: Take 1 tablet by mouth every 6 (six) hours as needed for moderate pain.  07/03/18   Bast, Tressia Miners A, NP  insulin aspart (NOVOLOG) 100 UNIT/ML injection Inject 20 Units into the skin 3 (three) times daily with meals.     [provider]  insulin glargine (LANTUS) 100 UNIT/ML injection Inject 60 Units into the skin at bedtime.     [provider]  isosorbide-hydrALAZINE (BIDIL) 20-37.5 MG tablet Take 2 tablets by mouth 3 (three) times daily. 05/18/18   Roxan Hockey, MD  loratadine (CLARITIN) 10 MG tablet Take 10 mg by mouth daily as needed for allergies.     [provider]  oxyCODONE (ROXICODONE) 5 MG immediate release tablet Take 1 tablet (5 mg total) by mouth every 8 (eight) hours as needed. 10/04/18 10/04/19  Serafina Mitchell, MD  pantoprazole (PROTONIX) 40 MG tablet Take 1 tablet (40 mg total) by mouth daily. 07/21/15   Barton Dubois, MD  rosuvastatin (CRESTOR) 40 MG tablet Take 40 mg by mouth every evening.     [provider]    Family History Family History  Problem Relation Age of Onset  . Lung cancer Mother   . Clotting disorder Mother   . Heart disease Mother   . Cancer Mother        Lung  . Deep vein thrombosis Mother   . Diabetes Mother   . Hypertension Mother   . Varicose Veins Mother   . Cancer Father   . Clotting disorder Sister   . Heart attack Sister   . Diabetes Sister   . Clotting disorder Brother   . Deep vein thrombosis Brother   . Diabetes Brother   . Heart disease Brother   . Heart disease  Sister     Social History Social History   Tobacco Use  . Smoking status: Never Smoker  . Smokeless tobacco: Never Used  Substance Use Topics  . Alcohol use: No    Alcohol/week: 0.0 standard drinks  . Drug use: Not Currently    Types: Marijuana    Comment: FORMER>>smoked weed from age 35 to 38>> quit at age 31      Allergies   Omnipaque [iohexol]   Review of Systems  Review of Systems  Unable to perform ROS: Acuity of condition     Physical Exam Updated Vital Signs BP (!) 148/109   Pulse 91   Temp (!) 96.4 F (35.8 C) (Oral)   Resp 18   Ht 5\' 2"  (1.575 m)   Wt (!) 143 kg   SpO2 100%   BMI 57.66 kg/m   Physical Exam  Constitutional: She appears well-developed and well-nourished.  Morbidly obese African-American female.  HENT:  Head: Normocephalic and atraumatic.  Eyes: Pupils are equal, round, and reactive to light.  Neck: Normal range of motion. Neck supple.  Cardiovascular: Normal rate, regular rhythm and intact distal pulses.  Pulmonary/Chest:  Bilateral breath sounds present on assisted respirations with BVM.  King airway in place.  Abdominal: Soft. She exhibits no distension. There is no tenderness.  Protuberant abdomen.  Neurological: She is unresponsive.  Responsive to painful stimuli in all 4 extremities.  Does not follow commands.  Skin: Skin is warm and dry.  Newly placed L AVF in place with palpable thrill.  Nursing note and vitals reviewed.    ED Treatments / Results  Labs (all labs ordered are listed, but only abnormal results are displayed) Labs Reviewed  COMPREHENSIVE METABOLIC PANEL - Abnormal; Notable for the following components:      Result Value   BUN 68 (*)    Creatinine, Ser 4.16 (*)    Albumin 3.2 (*)    AST 52 (*)    GFR calc non Af Amer 11 (*)    GFR calc Af Amer 12 (*)    All other components within normal limits  CBC WITH DIFFERENTIAL/PLATELET - Abnormal; Notable for the following components:   Hemoglobin 10.2 (*)     MCH 25.3 (*)    MCHC 28.1 (*)    Abs Immature Granulocytes 0.15 (*)    All other components within normal limits  URINALYSIS, ROUTINE W REFLEX MICROSCOPIC - Abnormal; Notable for the following components:   APPearance HAZY (*)    Protein, ur 100 (*)    Bacteria, UA RARE (*)    All other components within normal limits  COOXEMETRY PANEL - Abnormal; Notable for the following components:   Total hemoglobin 10.5 (*)    Methemoglobin 1.8 (*)    All other components within normal limits  ACETAMINOPHEN LEVEL - Abnormal; Notable for the following components:   Acetaminophen (Tylenol), Serum <10 (*)    All other components within normal limits  I-STAT CHEM 8, ED - Abnormal; Notable for the following components:   BUN 67 (*)    Creatinine, Ser 4.80 (*)    Hemoglobin 11.2 (*)    HCT 33.0 (*)    All other components within normal limits  I-STAT ARTERIAL BLOOD GAS, ED - Abnormal; Notable for the following components:   pH, Arterial 7.277 (*)    pCO2 arterial 63.2 (*)    pO2, Arterial 322.0 (*)    Bicarbonate 29.5 (*)    All other components within normal limits  POCT I-STAT 3, ART BLOOD GAS (G3+) - Abnormal; Notable for the following components:   pH, Arterial 7.299 (*)    pCO2 arterial 51.0 (*)    pO2, Arterial 81.0 (*)    All other components within normal limits  CULTURE, BLOOD (ROUTINE X 2)  CULTURE, BLOOD (ROUTINE X 2)  CULTURE, RESPIRATORY  PROTIME-INR  RAPID URINE DRUG SCREEN, HOSP PERFORMED  ETHANOL  SALICYLATE LEVEL  BLOOD GAS, ARTERIAL  HIV ANTIBODY (ROUTINE TESTING W REFLEX)  TROPONIN I  TROPONIN I  TROPONIN I  BASIC METABOLIC PANEL  BASIC METABOLIC PANEL  BASIC METABOLIC PANEL  BASIC METABOLIC PANEL  BASIC METABOLIC PANEL  BASIC METABOLIC PANEL  BASIC METABOLIC PANEL  PROTIME-INR  PROTIME-INR  APTT  APTT  MAGNESIUM  PROCALCITONIN  PROCALCITONIN  HEPARIN LEVEL (UNFRACTIONATED)  CBC  BLOOD GAS, ARTERIAL  BASIC METABOLIC PANEL  TROPONIN I  BASIC METABOLIC  PANEL  BASIC METABOLIC PANEL  BASIC METABOLIC PANEL  BASIC METABOLIC PANEL  BASIC METABOLIC PANEL  BASIC METABOLIC PANEL  TRIGLYCERIDES  TRIGLYCERIDES  I-STAT CG4 LACTIC ACID, ED  I-STAT BETA HCG BLOOD, ED (MC, WL, AP ONLY)  I-STAT TROPONIN, ED  CBG MONITORING, ED    EKG EKG Interpretation  Date/Time:  Sunday October 07 2018 19:42:53 EDT Ventricular Rate:  72 PR Interval:    QRS Duration: 124 QT Interval:  438 QTC Calculation: 480 R Axis:   -49 Text Interpretation:  Sinus rhythm  Nonspecific IVCD with LAD LVH with secondary repolarization abnormality Anterior Q waves, possibly due to LVH Confirmed by Davonna Belling [phone]) on 10/07/2018 7:47:45 PM   Radiology Ct Head Wo Contrast  Result Date: 10/07/2018 CLINICAL DATA:  Altered mental status. EXAM: CT HEAD WITHOUT CONTRAST TECHNIQUE: Contiguous axial images were obtained from the base of the skull through the vertex without intravenous contrast. COMPARISON:  07/02/2015 FINDINGS: Brain: No acute intracranial abnormality. Specifically, no hemorrhage, hydrocephalus, mass lesion, acute infarction, or significant intracranial injury. Vascular: No hyperdense vessel or unexpected calcification. Skull: No acute calvarial abnormality. Sinuses/Orbits: No acute finding Other: None IMPRESSION: No acute intracranial abnormality. Electronically Signed   By: Rolm Baptise M.D.   On: 10/07/2018 22:24   Dg Chest Port 1 View  Result Date: 10/07/2018 CLINICAL DATA:  Central line placement EXAM: PORTABLE CHEST 1 VIEW COMPARISON:  10/07/2018 FINDINGS: Left central line remains in the upper mediastinum near the confluence of the innominate veins, unchanged. No pneumothorax. Endotracheal tube is 4 cm above the carina. NG tube enters the stomach. Cardiomegaly. No confluent opacities or edema. No effusions or acute bony abnormality. IMPRESSION: Left central line tip remains near the confluence of the innominate veins. No pneumothorax. Cardiomegaly.  No  overt edema. Electronically Signed   By: Rolm Baptise M.D.   On: 10/07/2018 21:29   Dg Chest Portable 1 View  Result Date: 10/07/2018 CLINICAL DATA:  Central line placement. EXAM: PORTABLE CHEST 1 VIEW COMPARISON:  Earlier this day at 2019 hour FINDINGS: Tip of the left internal jugular central venous catheter in the region of the brachiocephalic/SVC confluence. No visualized pneumothorax, however the lung apices are not entirely included in the field of view. Endotracheal tube tip at the thoracic inlet 4.7 cm from the carina. Enteric tube in place with tip below the diaphragm. Low lung volumes persist. Unchanged cardiomegaly. Vascular congestion is similar. No large pleural effusion. IMPRESSION: 1. Tip of the left internal jugular central venous catheter in the region of the brachiocephalic/SVC confluence. No visualized pneumothorax. 2. Low lung volumes with unchanged cardiomegaly and vascular congestion. Electronically Signed   By: Keith Rake M.D.   On: 10/07/2018 21:29   Dg Chest Portable 1 View  Result Date: 10/07/2018 CLINICAL DATA:  Post intubation EXAM: PORTABLE CHEST 1 VIEW COMPARISON:  10/07/2018 FINDINGS: Endotracheal tube is 2 cm above the carina. NG tube enters the stomach. Cardiomegaly with vascular congestion, similar to prior study. No confluent opacities, effusions or pneumothorax. No definite visible rib fracture as questioned on prior chest x-ray. IMPRESSION: Endotracheal  tube 2 cm above the carina. Mild cardiomegaly, vascular congestion. Electronically Signed   By: Rolm Baptise M.D.   On: 10/07/2018 20:39   Dg Chest Portable 1 View  Result Date: 10/07/2018 CLINICAL DATA:  CPR EXAM: PORTABLE CHEST 1 VIEW COMPARISON:  05/12/2018 FINDINGS: Heart size is mildly enlarged. Mild vascular congestion. No confluent opacities, effusions or pneumothorax. Suspect left lateral rib fractures, but difficult to visualize. IMPRESSION: Cardiomegaly, vascular congestion.  No effusion or  pneumothorax. Suspect left lateral rib fractures, but difficult to visualize and characterize. Electronically Signed   By: Rolm Baptise M.D.   On: 10/07/2018 19:56    Procedures Procedure Name: Intubation Date/Time: 10/07/2018 11:54 PM Performed by: Fabian November, MD Pre-anesthesia Checklist: Patient identified, Suction available and Patient being monitored Oxygen Delivery Method: Ambu bag Preoxygenation: Pre-oxygenation with 100% oxygen Induction Type: Rapid sequence Ventilation: Mask ventilation without difficulty Laryngoscope Size: Glidescope and 3 Grade View: Grade II Tube size: 7.5 mm Number of attempts: 1 Airway Equipment and Method: Rigid stylet Placement Confirmation: ETT inserted through vocal cords under direct vision,  Positive ETCO2,  CO2 detector and Breath sounds checked- equal and bilateral Secured at: 22 cm Tube secured with: ETT holder Difficulty Due To: Difficult Airway- due to large tongue      (including critical care time)  Medications Ordered in ED Medications  norepinephrine (LEVOPHED) 4mg  in D5W 279mL premix infusion (0 mcg/min Intravenous Not Given 10/07/18 2316)  cisatracurium (NIMBEX) bolus via infusion 14.3 mg (14.3 mg Intravenous Bolus from Bag 10/07/18 2250)    And  cisatracurium (NIMBEX) 200 mg in sodium chloride 0.9 % 200 mL (1 mg/mL) infusion (1 mcg/kg/min  143 kg Intravenous New Bag/Given 10/07/18 2250)    And  cisatracurium (NIMBEX) bolus via infusion 7.2 mg (has no administration in time range)  artificial tears (LACRILUBE) ophthalmic ointment 1 application (1 application Both Eyes Given 10/07/18 2315)  0.9 %  sodium chloride infusion ( Intravenous Not Given 10/07/18 2316)  fentaNYL (SUBLIMAZE) injection 100 mcg (100 mcg Intravenous Not Given 10/07/18 2251)  fentaNYL 2585mcg in NS 232mL (28mcg/ml) infusion-PREMIX (50 mcg/hr Intravenous IV Pump Association 10/07/18 2253)  fentaNYL (SUBLIMAZE) bolus via infusion 50 mcg (has no administration in  time range)  midazolam (VERSED) injection 2 mg (2 mg Intravenous Not Given 10/07/18 2315)  midazolam (VERSED) 50 mg in sodium chloride 0.9 % 50 mL (1 mg/mL) infusion (2 mg/hr Intravenous New Bag/Given 10/07/18 2118)  midazolam (VERSED) bolus via infusion 2 mg (has no administration in time range)  pantoprazole sodium (PROTONIX) 40 mg/20 mL oral suspension 40 mg (has no administration in time range)  ipratropium-albuterol (DUONEB) 0.5-2.5 (3) MG/3ML nebulizer solution 3 mL (3 mLs Nebulization Given 10/07/18 2140)  heparin bolus via infusion 2,600 Units (has no administration in time range)  heparin ADULT infusion 100 units/mL (25000 units/280mL sodium chloride 0.45%) (has no administration in time range)  aspirin chewable tablet 81 mg (has no administration in time range)  carvedilol (COREG) tablet 6.25 mg (has no administration in time range)  rosuvastatin (CRESTOR) tablet 40 mg (has no administration in time range)  nicardipine (CARDENE) 20mg  in 0.86% saline 21ml IV infusion (0.1 mg/ml) (has no administration in time range)  furosemide (LASIX) injection 60 mg (60 mg Intravenous Given 10/07/18 2334)     Initial Impression / Assessment and Plan / ED Course  I have reviewed the triage vital signs and the nursing notes.  Pertinent labs & imaging results that were available during my care of the patient were  reviewed by me and considered in my medical decision making (see chart for details).     Patient is a 57 year old female with PMHx of HFpEF, COPD, CKD with recent placement of LAVF on 10/04/18, morbid obesity, and OSA on CPAP who presents from home via EMS s/p witnessed cardiac arrest at 1830 this evening without bystander CPR for 10 minutes.   ROSC obtained after 101min of CPR.  No medications given. BS 70s.    On arrival she remained unresponsive however HR 70s and normotensive.  EKG without acute ischemic changes.  Cause of VFib/PEA arrest broad including primary cardiac event vs  respiratory event from obesity hypoventilation syndrome vs acutely decompensated heart failure.  Lactic is not elevated after prolonged CPR.  Electrolytes wnl.  CXR with cardiomegaly, vascular congestion, and difficult to visual left lateral rib fractures.  No obvious PTX or effusion.  Patient was intubated for airway protection as procedure note above.  Cardiology was consulted who recommends IV heparin and cycling of cardiac enzymes.  Patient was placed on the hypothermia protocol and cooled given V. fib/PEA cardiac arrest.  Discussed case with critical care who will admit for further management.  Left IJ central catheter placed by NP.  Patient transferred to ICU.  Family updated who verbalized their understanding and agreement with plan.  Final Clinical Impressions(s) / ED Diagnoses   Final diagnoses:  Ventilator dependent ([place])  Acute respiratory failure with hypoxia [facility])    ED Discharge Orders    None       Fabian November, MD 10/08/18 Jen Mow    Davonna Belling, MD 10/10/18 [phone]

## 2018-10-07 NOTE — ED Notes (Signed)
CCM at bedside fore [place] placement. Family updated by chaplain

## 2018-10-07 NOTE — ED Notes (Signed)
RT and Dr. Benjamine Mola at bedside for [facility] removal and ETT placement. 2020 Etomidate 2mg  IVP given 2021 Rocuronium 150mg  IVP given 2022 pt intubated on first pass by Dr. Benjamine Mola, 7.5 ETT, 23 lip, + color change on Co2 detector, +LS throughout

## 2018-10-07 NOTE — ED Notes (Signed)
Port chest done for L IJ [facility] placement,

## 2018-10-07 NOTE — Consult Note (Signed)
Cardiology Consultation:   Patient ID: Catherine Williams MRN: [id number]; DOB: 08-17-1961  Admit date: 10/07/2018 Date of Consult: 10/07/2018  Primary Care Provider: Vonna Drafts, FNP Primary Cardiologist: No primary care provider on file.  Primary Electrophysiologist:  None    Patient Profile:   Catherine Williams is a 57 y.o. female with a hx of advanced kidney disease and morbid obesity who is being seen today for the evaluation of cardiac arrest at the request of Dr Alvino Chapel.  History of Present Illness:   Ms. Daughety had a witnessed cardiac arrest at home tonight.  911 was called by family.  The fire department arrived after approximately 10 minutes.  No CPR was administered initially.  At the time of fire department arrival, an AED was placed and 2 shocks were delivered.  The patient then went on to require an additional 30 minutes of CPR for PEA.  There is no family at the bedside and no other history obtainable at this time.  Past Medical History:  Diagnosis Date  . Anemia   . Arthritis Dec. 2014   Gout  . Asthma   . CHF (congestive heart failure) ([place])   . COPD (chronic obstructive pulmonary disease) ([place])   . Diabetes mellitus   . GERD (gastroesophageal reflux disease)   . Heart failure   . Hyperlipidemia   . Hypertension   . Kidney disease   . Morbid obesity ([place])   . Peripheral vascular disease ([place])   . Pinched nerve   . Requires supplemental oxygen    as needed  . Sleep apnea    wears cpap  . Wears dentures     Past Surgical History:  Procedure Laterality Date  . Atherectomy and angioplasty  10/18/2011   left posterior tibial artery  . AV FISTULA PLACEMENT Left 10/04/2018   Procedure: ARTERIOVENOUS (AV) FISTULA CREATION LEFT ARM;  Surgeon: Serafina Mitchell, MD;  Location: [facility];  Service: Vascular;  Laterality: Left;  . COLONOSCOPY    . HAND SURGERY     right  . MULTIPLE TOOTH EXTRACTIONS    . OVARY SURGERY     Left  . TOE AMPUTATION  Sept. 25,2012    Left 4th and 5th toes     Home Medications:  Prior to Admission medications   Medication Sig Start Date End Date Taking? Authorizing Provider  albuterol (PROVENTIL HFA;VENTOLIN HFA) 108 (90 BASE) MCG/ACT inhaler Inhale 1-2 puffs into the lungs every 6 (six) hours as needed for wheezing. 07/25/13   Harden Mo, MD  allopurinol (ZYLOPRIM) 100 MG tablet Take 1 tablet (100 mg total) by mouth at bedtime. 07/16/18   Jessy Oto, MD  amLODipine (NORVASC) 5 MG tablet Take 1 tablet (5 mg total) by mouth daily. 05/19/18   Roxan Hockey, MD  aspirin 81 MG tablet Take 81 mg by mouth daily.      [provider]  carvedilol (COREG) 6.25 MG tablet Take 1 tablet (6.25 mg total) by mouth 2 (two) times daily with a meal. 05/18/18   Denton Brick, Courage, MD  Darbepoetin Alfa (ARANESP) 60 MCG/0.3ML SOSY injection Inject 0.3 mLs (60 mcg total) into the skin every Thursday at 6pm. Patient taking differently: Inject 60 mcg into the skin every 30 (thirty) days.  11/03/16   Doreatha Lew, MD  ferrous sulfate 325 (65 FE) MG tablet Take 1 tablet (325 mg total) by mouth 2 (two) times daily with a meal. 11/03/16   Patrecia Pour, Christean Grief, MD  furosemide (  LASIX) 80 MG tablet Take 1 tablet (80 mg)  in the morning, and 1/2 tablet (40 mg)  in the evening Patient taking differently: Take 80 mg by mouth 2 (two) times daily.  05/18/18   Roxan Hockey, MD  HYDROcodone-acetaminophen (NORCO/VICODIN) 5-325 MG tablet Take 1 tablet by mouth every 6 (six) hours as needed. Patient taking differently: Take 1 tablet by mouth every 6 (six) hours as needed for moderate pain.  07/03/18   Bast, Tressia Miners A, NP  insulin aspart (NOVOLOG) 100 UNIT/ML injection Inject 20 Units into the skin 3 (three) times daily with meals.     [provider]  insulin glargine (LANTUS) 100 UNIT/ML injection Inject 60 Units into the skin at bedtime.     [provider]  isosorbide-hydrALAZINE (BIDIL) 20-37.5 MG tablet Take 2 tablets by  mouth 3 (three) times daily. 05/18/18   Roxan Hockey, MD  loratadine (CLARITIN) 10 MG tablet Take 10 mg by mouth daily as needed for allergies.     [provider]  oxyCODONE (ROXICODONE) 5 MG immediate release tablet Take 1 tablet (5 mg total) by mouth every 8 (eight) hours as needed. 10/04/18 10/04/19  Serafina Mitchell, MD  pantoprazole (PROTONIX) 40 MG tablet Take 1 tablet (40 mg total) by mouth daily. 07/21/15   Barton Dubois, MD  rosuvastatin (CRESTOR) 40 MG tablet Take 40 mg by mouth every evening.     [provider]    Inpatient Medications: Scheduled Meds:  Continuous Infusions:  PRN Meds:   Allergies:    Allergies  Allergen Reactions  . Omnipaque [Iohexol] Nausea And Vomiting    Contrast Dye- Effects Kidney's    Social History:   Social History   Socioeconomic History  . Marital status: Single    Spouse name: Not on file  . Number of children: Y  . Years of education: Not on file  . Highest education level: Not on file  Occupational History  . 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  Social Needs  . Financial resource strain: Not on file  . Food insecurity:    Worry: Not on file    Inability: Not on file  . Transportation needs:    Medical: Not on file    Non-medical: Not on file  Tobacco Use  . Smoking status: Never Smoker  . Smokeless tobacco: Never Used  Substance and Sexual Activity  . Alcohol use: No    Alcohol/week: 0.0 standard drinks  . Drug use: Not Currently    Types: Marijuana    Comment: FORMER>>smoked weed from age 45 to 38>> quit at age 63   . Sexual activity: Not on file  Lifestyle  . Physical activity:    Days per week: Not on file    Minutes per session: Not on file  . Stress: Not on file  Relationships  . Social connections:    Talks on phone: Not on file    Gets together: Not on file    Attends religious service: Not on file    Active member of club or  organization: Not on file    Attends meetings of clubs or organizations: Not on file    Relationship status: Not on file  . Intimate partner violence:    Fear of current or ex partner: Not on file    Emotionally abused: Not on file    Physically abused: Not on file    Forced sexual activity: Not  on file  Other Topics Concern  . Not on file  Social History Narrative   Lives with daughter          Family History:    Family History  Problem Relation Age of Onset  . Lung cancer Mother   . Clotting disorder Mother   . Heart disease Mother   . Cancer Mother        Lung  . Deep vein thrombosis Mother   . Diabetes Mother   . Hypertension Mother   . Varicose Veins Mother   . Cancer Father   . Clotting disorder Sister   . Heart attack Sister   . Diabetes Sister   . Clotting disorder Brother   . Deep vein thrombosis Brother   . Diabetes Brother   . Heart disease Brother   . Heart disease Sister      ROS:  Please see the history of present illness.  Unable to obtain secondary to critical illness  Physical Exam/Data:   Vitals:   10/07/18 1951  BP: (!) 148/62  Pulse: 69  Resp: (!) 22  SpO2: 100%   No intake or output data in the 24 hours ending 10/07/18 2020 There were no vitals filed for this visit. There is no height or weight on file to calculate BMI.  General:  Morbidly obese woman, King airway in place, unconscious HEENT: normal Lymph: no adenopathy Neck: large neck, unable to visualize JVP Endocrine:  No thryomegaly Vascular: No carotid bruits;  Cardiac:  normal S1, S2; RRR; 2/6 systolic murmur at the RUSB Lungs:  clear to auscultation bilaterally, no wheezing, rhonchi or rales  Abd: soft, obese, no masses Ext: no edema Musculoskeletal:  No deformities, left arm AV fistula appears to be healing appropriately without erythema or induration Skin: warm and dry  Neuro:  unconsious Psych:  Cannot assess  EKG:  The EKG was personally reviewed and demonstrates:  Normal sinus rhythm with left anterior fascicular block, no significant ST changes.  Nonspecific T wave abnormality is present.  Telemetry:  Telemetry was personally reviewed and demonstrates: Normal sinus rhythm  Relevant CV Studies: 2D echocardiogram 05/14/2018: Study Conclusions  - Left ventricle: The cavity size was normal. Systolic function was   normal. The estimated ejection fraction was in the range of 55%   to 60%. Wall motion was normal; there were no regional wall   motion abnormalities. Doppler parameters are consistent with   abnormal left ventricular relaxation (grade 1 diastolic   dysfunction). - Aortic valve: Not visualized. - Aortic root: Not visualized. - Mitral valve: Not visualized. - Left atrium: The atrium was normal in size. - Right ventricle: Not visualized. - Inferior vena cava: Not visualized.  Impressions:  - Poor acoustic windows, LVEF on Definity images normal, RV not   visualized.  Laboratory Data:  Chemistry Recent Labs  Lab 10/04/18 0708 10/07/18 1948  NA 145 144  K 4.2 4.2  CL  --  109  GLUCOSE 51* 75  BUN  --  67*  CREATININE  --  4.80*    No results for input(s): PROT, ALBUMIN, AST, ALT, ALKPHOS, BILITOT in the last 168 hours. Hematology Recent Labs  Lab 10/04/18 0708 10/07/18 1947 10/07/18 1948  WBC  --  10.3  --   RBC  --  4.03  --   HGB 10.9* 10.2* 11.2*  HCT 32.0* 36.3 33.0*  MCV  --  90.1  --   MCH  --  25.3*  --   MCHC  --  28.1*  --   RDW  --  15.5  --   PLT  --  227  --    Cardiac EnzymesNo results for input(s): TROPONINI in the last 168 hours.  Recent Labs  Lab 10/07/18 1946  TROPIPOC 0.03    BNPNo results for input(s): BNP, PROBNP in the last 168 hours.  DDimer No results for input(s): DDIMER in the last 168 hours.  Radiology/Studies:  Dg Chest Portable 1 View  Result Date: 10/07/2018 CLINICAL DATA:  CPR EXAM: PORTABLE CHEST 1 VIEW COMPARISON:  05/12/2018 FINDINGS: Heart size is mildly enlarged. Mild  vascular congestion. No confluent opacities, effusions or pneumothorax. Suspect left lateral rib fractures, but difficult to visualize. IMPRESSION: Cardiomegaly, vascular congestion.  No effusion or pneumothorax. Suspect left lateral rib fractures, but difficult to visualize and characterize. Electronically Signed   By: Rolm Baptise M.D.   On: 10/07/2018 19:56    Assessment and Plan:   1. Out of hospital cardiac arrest (presumably ventricular fibrillation/PEA) 2. End-stage renal disease with recently placed AV fistula 3. VDRF, secondary to #1 4. Chronic respiratory failure, question obesity-hypoventilation  EKG not acute. Broad differential for etiology of arrest. Primary cardiac/ischemic event in differential but now appears stable with no significant ST changes seen on EKG. Pt at risk for hypoxemic/respiratory event as well. Will check a 2D echo and would start IV heparin and cycle cardiac enzymes. Interesting that her lactate is not elevated after a prolonged period of unconsciousness with no CPR followed by 30 minutes of CPR. Will follow with you.   For questions or updates, please contact [place] Please consult [email or web address] for contact info under   Signed, Sherren Mocha, MD  10/07/2018 8:20 PM

## 2018-10-07 NOTE — ED Notes (Signed)
bilat lower extremity IO's removed

## 2018-10-07 NOTE — Progress Notes (Signed)
Pt transported from Tr A to CT 1 then to [place] from CT without event.  RT will monitor.

## 2018-10-07 NOTE — ED Triage Notes (Signed)
Pt transported from home by EMS for witnessed arrest by family @ 1830, no bystander CPR for 10 min, FD shocked x 2, [facility] CPR in PEA x 30 min. ROSC at 1900.  #18 R AC by EMS. No Medications given pre op. King airway.

## 2018-10-07 NOTE — ED Notes (Signed)
Dr. Cooper at bedside 

## 2018-10-07 NOTE — Progress Notes (Signed)
ANTICOAGULATION CONSULT NOTE - Initial Consult  Pharmacy Consult for heparin Indication: chest pain/ACS  Allergies  Allergen Reactions  . Omnipaque [Iohexol] Nausea And Vomiting    Contrast Dye- Effects Kidney's    Patient Measurements: Height: 5\' 2"  (157.5 cm) Weight: (!) 315 lb 4.1 oz (143 kg) IBW/kg (Calculated) : 50.1 Heparin Dosing Weight: 86.7 kg  Vital Signs: Temp: 96.4 F (35.8 C) (10/20 2020) Temp Source: Oral (10/20 2020) BP: 147/51 (10/20 2045) Pulse Rate: 75 (10/20 2045)  Labs: Recent Labs    10/07/18 1947 10/07/18 1948  HGB 10.2* 11.2*  HCT 36.3 33.0*  PLT 227  --   LABPROT 13.9  --   INR 1.08  --   CREATININE 4.16* 4.80*    Estimated Creatinine Clearance: 17.4 mL/min (A) (by C-G formula based on SCr of 4.8 mg/dL (H)).   Medical History: Past Medical History:  Diagnosis Date  . Anemia   . Arthritis Dec. 2014   Gout  . Asthma   . CHF (congestive heart failure) ([place])   . COPD (chronic obstructive pulmonary disease) ([place])   . Diabetes mellitus   . GERD (gastroesophageal reflux disease)   . Heart failure   . Hyperlipidemia   . Hypertension   . Kidney disease   . Morbid obesity ([place])   . Peripheral vascular disease ([place])   . Pinched nerve   . Requires supplemental oxygen    as needed  . Sleep apnea    wears cpap  . Wears dentures     Medications:  Scheduled:  . artificial tears  1 application Both Eyes M6K  . cisatracurium  0.1 mg/kg Intravenous Once  . fentaNYL (SUBLIMAZE) injection  100 mcg Intravenous Once  . ipratropium-albuterol  3 mL Nebulization Q6H  . midazolam  2 mg Intravenous Once  . [START ON 10/08/2018] pantoprazole sodium  40 mg Per Tube Daily    Assessment: 64 yof underwent cardiac arrest tonight, no anticoag PTA. Trop 0.03. No significant ST changes on EKG. Now plan for therapeutic hypothermia.   Hgb 11.2, plt 227. No s/sx of bleeding.   Goal of Therapy:  Heparin level 0.3-0.7 units/ml Monitor platelets by  anticoagulation protocol: Yes   Plan:  Give 2600 units bolus x 1 Start heparin infusion at 700 units/hr Check anti-Xa level in 6 hours and daily while on heparin Continue to monitor H&H and platelets  Doylene Canard, PharmD Clinical Pharmacist  Pager: [phone] Phone: [phone] 10/07/2018,9:18 PM

## 2018-10-07 NOTE — ED Notes (Signed)
Fentanyl drip arrived from pharmacy, pt to go to CT then 2H. RT at bedside

## 2018-10-07 NOTE — Procedures (Signed)
Central Venous Catheter Insertion Procedure Note FALYNN AILEY [id number] [birth year]  Procedure: Insertion of Central Venous Catheter Indications: Assessment of intravascular volume, Drug and/or fluid administration and Frequent blood sampling  Procedure Details Consent: Unable to obtain consent because of emergent medical necessity. Time Out: Verified patient identification, verified procedure, site/side was marked, verified correct patient position, special equipment/implants available, medications/allergies/relevent history reviewed, required imaging and test results available.  Performed  Maximum sterile technique was used including antiseptics, cap, gloves, gown, hand hygiene, mask and sheet. Skin prep: Chlorhexidine; local anesthetic administered A antimicrobial bonded/coated triple lumen catheter was placed in the left internal jugular vein using the Seldinger technique.  Evaluation Blood flow good Complications: No apparent complications Patient did tolerate procedure well. Chest X-ray ordered to verify placement.  CXR: pending.  Hayden Pedro, AGACNP-BC [facility]  PCCM Pgr: [phone]

## 2018-10-07 NOTE — Progress Notes (Signed)
Patient arrived via EMS with [facility] Airway in place.  Patient bag mask valve until intubation by ED physician.

## 2018-10-07 NOTE — Progress Notes (Signed)
   10/07/18 2000  Clinical Encounter Type  Visited With Health care provider;Family  Visit Type Initial;ED;Other (Comment);Critical Care (post-CPR)  Referral From Other (Comment) (ED secretary)  Stress Factors  Family Stress Factors Loss of control;Other (Comment) (very afraid)   Escorted family to consult B, let Dr. Benjamine Mola know.  Went w/ Dr. Benjamine Mola when she updated the family.  Compassionate presence, let family and medical team know that another chaplain was taking over and would  be available to continue support.  Briefed incoming chaplain.  Myra Gianotti resident, [phone]

## 2018-10-07 NOTE — Procedures (Signed)
Arterial Catheter Insertion Procedure Note Catherine Williams [id number] [birth year]  Procedure: Insertion of Arterial Catheter  Indications: Blood pressure monitoring  Procedure Details Consent: Unable to obtain consent because of altered level of consciousness. Time Out: Verified patient identification, verified procedure, site/side was marked, verified correct patient position, special equipment/implants available, medications/allergies/relevent history reviewed, required imaging and test results available.  Performed  Maximum sterile technique was used including antiseptics, cap, gloves, gown, hand hygiene and mask. Skin prep: Chlorhexidine; local anesthetic administered 20 gauge catheter was inserted into right radial artery using the Seldinger technique. ULTRASOUND GUIDANCE USED: NO Evaluation Blood flow good; BP tracing good. Complications: No apparent complications.  Right Radial Arterial line placed. No complications noted. Art line has good blood flow and flushes w/o complications.   [name], B.S, RRT, RCP 10/07/2018

## 2018-10-07 NOTE — H&P (Addendum)
NAME:  Catherine Williams, MRN:  [id number], DOB:  1961-08-20, LOS: 0 ADMISSION DATE:  10/07/2018, CONSULTATION DATE:  10/07/2018 REFERRING MD:  Dr. Benjamine Mola , CHIEF COMPLAINT:  Cardiac Arrest    Brief History   57 year old female presents to ED on 10/20 s/p Witnessed Arrest. Family reports that patient was of normal state of health, this afternoon she was sitting on couch with daughter when she was noted to be breathing "funny", when daughter tired to stimulate patient she was unresponsive at 1830. EMS arrived 1840 and patient was noted to be pulseless. ROSC achieved at 1900 after 2 shocks and CPR. On arrival to ED patient remained unresponsive. LA 1.38. K 4.2. WBC 10.3. BP 174/80. Cardiology consulted. PCCM asked to admit.   Past Medical History  Diastolic HF (EF 15-40, G8QP), COPD, PVD, Chronic Kidney Disease (Placement of Fistula on 10/17)  Significant Hospital Events   10/20 > Presents to ED s/p Cardiac Arrest   Consults: date of consult/date signed off & final recs:  Cardiology 10/20 PCCM 10/20  Procedures (surgical and bedside):  ETT 10/20 >> Left IJ 10/20 >>   Significant Diagnostic Tests:  CXR 10/20 > Heart size is mildly enlarged. Mild vascular congestion. No confluent opacities, effusions or pneumothorax. Suspect left lateral rib fractures, but difficult to visualize. CT Head 10/20 >> ECHO 10/21 >> EEG 10/21 >>   Micro Data:  Blood 10/20 >> Sputum 10/20 >> U/A 10/20 >>  Antimicrobials:  N/A    Subjective:    Objective   Blood pressure (!) 160/82, pulse 76, temperature (!) 96.4 F (35.8 C), temperature source Oral, resp. rate (!) 0, height 5\' 2"  (1.575 m), weight (!) 143 kg, SpO2 100 %.    FiO2 (%):  [100 %] 100 %   Intake/Output Summary (Last 24 hours) at 10/07/2018 2148 Last data filed at 10/07/2018 2037 Gross per 24 hour  Intake -  Output 50 ml  Net -50 ml   Filed Weights   10/07/18 2031  Weight: (!) 143 kg    Examination: General: Obese Adult  Female, on vent  HENT: ETT in place  Lungs: Crackles to bases, no wheeze Cardiovascular: RRR, no MRG Abdomen: soft, non-distended, active bowel sounds  Extremities: +1 pedal edema  Neuro: pupils intact/reactive, withdrawals to pain, does not follow commands GU: Foley in place   Resolved Hospital Problem list     Assessment & Plan:   Acute Hypercarbic Respiratory Failure suspected secondary to decompensated Heart Failure   H/O COPD, OSA on CPAP at HS  Plan  -Vent Support -Trend ABG/CXR > Increase Rate, Repeat ABG at 0000 -Pulmonary Hygiene  -VAP Bundle  -Scheduled Duoneb  V.Fib/PEA Cardiac Arrest  Decompensated Heart Failure  H/O Diastolic HF (EF 61-95, K9TO) -EKG with LVH and QTC 480 Plan  -Cardiac Monitoring  -Cardiology Following > Recommend Heparin gtt -Witnessed, V.Fib arrest, Hemodynamically  stable > will start Hypothermia Protocol  -Trend Troponin  -ECHO  -Continue ASA, Crestor, Coreg   Acute on Chronic Stage IV Kidney Disease  Baseline Crt 3.0 Recent placement of Fistula  LA 1.38  Plan  -Trend BMP  -Replace electrolytes as indicated  -No indications of emergent HD at this time > Consult Nephrology in AM  -Lasix 60 meq x 1, followed by scheduled   Hypoglycemia  H/O DM Plan  -Trend Glucose q1h -Hold home Lantus  -PRN Dextrose   Encephalopathy in setting of uremia vs hypercarbia vs anoxic injury  -UDS negative  Plan  -  Hypothermia Protocol as above > Versed/Fentanyl/Nimbex  -Salicylate/Tylenol/ETOH pending -EEG and Head CT pending    Disposition / Summary of Today's Plan 10/07/18   F/U Head CT/EEG    Diet: NPO Pain/Anxiety/Delirium protocol  VAP protocol DVT prophylaxis: Heparin gtt as above  GI prophylaxis: PPI Hyperglycemia protocol Mobility: Bedrest  Code Status: FC Family Communication: Family updated at beside, patient is not married and has 3 Adult Daughters   Labs   CBC: Recent Labs  Lab 10/04/18 0708 10/07/18 1947  10/07/18 1948  WBC  --  10.3  --   NEUTROABS  --  7.7  --   HGB 10.9* 10.2* 11.2*  HCT 32.0* 36.3 33.0*  MCV  --  90.1  --   PLT  --  227  --     Basic Metabolic Panel: Recent Labs  Lab 10/04/18 0708 10/07/18 1947 10/07/18 1948  NA 145 141 144  K 4.2 4.2 4.2  CL  --  109 109  CO2  --  22  --   GLUCOSE 51* 75 75  BUN  --  68* 67*  CREATININE  --  4.16* 4.80*  CALCIUM  --  9.6  --    GFR: Estimated Creatinine Clearance: 17.4 mL/min (A) (by C-G formula based on SCr of 4.8 mg/dL (H)). Recent Labs  Lab 10/07/18 1947 10/07/18 1948  WBC 10.3  --   LATICACIDVEN  --  1.38    Liver Function Tests: Recent Labs  Lab 10/07/18 1947  AST 52*  ALT 25  ALKPHOS 79  BILITOT 0.5  PROT 7.4  ALBUMIN 3.2*   No results for input(s): LIPASE, AMYLASE in the last 168 hours. No results for input(s): AMMONIA in the last 168 hours.  ABG    Component Value Date/Time   PHART 7.185 (LL) 05/14/2018 1837   PCO2ART 78.0 (HH) 05/14/2018 1837   PO2ART 85.9 05/14/2018 1837   HCO3 28.3 (H) 05/14/2018 1837   TCO2 26 10/07/2018 1948   ACIDBASEDEF 2.0 09/22/2011 1237   O2SAT 96.3 05/14/2018 1837     Coagulation Profile: Recent Labs  Lab 10/07/18 1947  INR 1.08    Cardiac Enzymes: No results for input(s): CKTOTAL, CKMB, CKMBINDEX, TROPONINI in the last 168 hours.  HbA1C: Hgb A1c MFr Bld  Date/Time Value Ref Range Status  10/23/2016 02:54 PM 8.0 (H) 4.8 - 5.6 % Final    Comment:    (NOTE)         Pre-diabetes: 5.7 - 6.4         Diabetes: >6.4         Glycemic control for adults with diabetes: <7.0   07/17/2015 02:09 AM 9.4 (H) 4.8 - 5.6 % Final    Comment:    (NOTE)         Pre-diabetes: 5.7 - 6.4         Diabetes: >6.4         Glycemic control for adults with diabetes: <7.0     CBG: Recent Labs  Lab 10/04/18 0721 10/04/18 0826 10/04/18 0859 10/04/18 1057 10/07/18 1936  GLUCAP 79 60* 85 76 78    Admitting History of Present Illness.   57 year old female  presents to ED on 10/20 s/p Witnessed Arrest. Family reports that patient was of normal state of health, this afternoon she was sitting on couch with daughter when she was noted to be breathing "funny", when daughter tired to stimulate patient she was unresponsive at 1830. EMS arrived 1840 and patient  was noted to be pulseless. ROSC achieved at 1900 after 2 shocks and CPR. On arrival to ED patient remained unresponsive. LA 1.38. K 4.2. WBC 10.3. BP 174/80. Cardiology consulted. Marland KitchenPCCM asked to admit.   Review of Systems:   Unable to review as patient is encephalopathic and intubated   Past Medical History  She,  has a past medical history of Anemia, Arthritis (Dec. 2014), Asthma, CHF (congestive heart failure) ([place]), COPD (chronic obstructive pulmonary disease) ([place]), Diabetes mellitus, GERD (gastroesophageal reflux disease), Heart failure, Hyperlipidemia, Hypertension, Kidney disease, Morbid obesity ([place]), Peripheral vascular disease ([place]), Pinched nerve, Requires supplemental oxygen, Sleep apnea, and Wears dentures.   Surgical History    Past Surgical History:  Procedure Laterality Date  . Atherectomy and angioplasty  10/18/2011   left posterior tibial artery  . AV FISTULA PLACEMENT Left 10/04/2018   Procedure: ARTERIOVENOUS (AV) FISTULA CREATION LEFT ARM;  Surgeon: Serafina Mitchell, MD;  Location: [facility];  Service: Vascular;  Laterality: Left;  . COLONOSCOPY    . HAND SURGERY     right  . MULTIPLE TOOTH EXTRACTIONS    . OVARY SURGERY     Left  . TOE AMPUTATION  Sept. 25,2012   Left 4th and 5th toes     Social History   Social History   Socioeconomic History  . Marital status: Single    Spouse name: Not on file  . Number of children: Y  . Years of education: Not on file  . Highest education level: Not on file  Occupational History  . 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  Social Needs  . Financial resource strain: Not on file   . Food insecurity:    Worry: Not on file    Inability: Not on file  . Transportation needs:    Medical: Not on file    Non-medical: Not on file  Tobacco Use  . Smoking status: Never Smoker  . Smokeless tobacco: Never Used  Substance and Sexual Activity  . Alcohol use: No    Alcohol/week: 0.0 standard drinks  . Drug use: Not Currently    Types: Marijuana    Comment: FORMER>>smoked weed from age 34 to 38>> quit at age 1   . Sexual activity: Not on file  Lifestyle  . Physical activity:    Days per week: Not on file    Minutes per session: Not on file  . Stress: Not on file  Relationships  . Social connections:    Talks on phone: Not on file    Gets together: Not on file    Attends religious service: Not on file    Active member of club or organization: Not on file    Attends meetings of clubs or organizations: Not on file    Relationship status: Not on file  . Intimate partner violence:    Fear of current or ex partner: Not on file    Emotionally abused: Not on file    Physically abused: Not on file    Forced sexual activity: Not on file  Other Topics Concern  . Not on file  Social History Narrative   Lives with daughter        ,  reports that she has never smoked. She has never used smokeless tobacco. She reports that she has current or past drug history. Drug: Marijuana. She reports that she does not drink alcohol.   Family History   Her family  history includes Cancer in her father and mother; Clotting disorder in her brother, mother, and sister; Deep vein thrombosis in her brother and mother; Diabetes in her brother, mother, and sister; Heart attack in her sister; Heart disease in her brother, mother, and sister; Hypertension in her mother; Lung cancer in her mother; Varicose Veins in her mother.   Allergies Allergies  Allergen Reactions  . Omnipaque [Iohexol] Nausea And Vomiting    Contrast Dye- Effects Kidney's     Home Medications  Prior to Admission  medications   Medication Sig Start Date End Date Taking? Authorizing Provider  albuterol (PROVENTIL HFA;VENTOLIN HFA) 108 (90 BASE) MCG/ACT inhaler Inhale 1-2 puffs into the lungs every 6 (six) hours as needed for wheezing. 07/25/13   Harden Mo, MD  allopurinol (ZYLOPRIM) 100 MG tablet Take 1 tablet (100 mg total) by mouth at bedtime. 07/16/18   Jessy Oto, MD  amLODipine (NORVASC) 5 MG tablet Take 1 tablet (5 mg total) by mouth daily. 05/19/18   Roxan Hockey, MD  aspirin 81 MG tablet Take 81 mg by mouth daily.      [provider]  carvedilol (COREG) 6.25 MG tablet Take 1 tablet (6.25 mg total) by mouth 2 (two) times daily with a meal. 05/18/18   Denton Brick, Courage, MD  Darbepoetin Alfa (ARANESP) 60 MCG/0.3ML SOSY injection Inject 0.3 mLs (60 mcg total) into the skin every Thursday at 6pm. Patient taking differently: Inject 60 mcg into the skin every 30 (thirty) days.  11/03/16   Doreatha Lew, MD  ferrous sulfate 325 (65 FE) MG tablet Take 1 tablet (325 mg total) by mouth 2 (two) times daily with a meal. 11/03/16   Patrecia Pour, Christean Grief, MD  furosemide (LASIX) 80 MG tablet Take 1 tablet (80 mg)  in the morning, and 1/2 tablet (40 mg)  in the evening Patient taking differently: Take 80 mg by mouth 2 (two) times daily.  05/18/18   Roxan Hockey, MD  HYDROcodone-acetaminophen (NORCO/VICODIN) 5-325 MG tablet Take 1 tablet by mouth every 6 (six) hours as needed. Patient taking differently: Take 1 tablet by mouth every 6 (six) hours as needed for moderate pain.  07/03/18   Bast, Tressia Miners A, NP  insulin aspart (NOVOLOG) 100 UNIT/ML injection Inject 20 Units into the skin 3 (three) times daily with meals.     [provider]  insulin glargine (LANTUS) 100 UNIT/ML injection Inject 60 Units into the skin at bedtime.     [provider]  isosorbide-hydrALAZINE (BIDIL) 20-37.5 MG tablet Take 2 tablets by mouth 3 (three) times daily. 05/18/18   Roxan Hockey, MD  loratadine  (CLARITIN) 10 MG tablet Take 10 mg by mouth daily as needed for allergies.     [provider]  oxyCODONE (ROXICODONE) 5 MG immediate release tablet Take 1 tablet (5 mg total) by mouth every 8 (eight) hours as needed. 10/04/18 10/04/19  Serafina Mitchell, MD  pantoprazole (PROTONIX) 40 MG tablet Take 1 tablet (40 mg total) by mouth daily. 07/21/15   Barton Dubois, MD  rosuvastatin (CRESTOR) 40 MG tablet Take 40 mg by mouth every evening.     [provider]     Critical care time: 68 minutes      Hayden Pedro, AGACNP-BC [facility]  PCCM Pgr: [phone]

## 2018-10-08 ENCOUNTER — Inpatient Hospital Stay (HOSPITAL_COMMUNITY): Payer: Medicare Other

## 2018-10-08 DIAGNOSIS — G931 Anoxic brain damage, not elsewhere classified: Secondary | ICD-10-CM

## 2018-10-08 DIAGNOSIS — I509 Heart failure, unspecified: Secondary | ICD-10-CM

## 2018-10-08 DIAGNOSIS — J9602 Acute respiratory failure with hypercapnia: Secondary | ICD-10-CM

## 2018-10-08 DIAGNOSIS — N185 Chronic kidney disease, stage 5: Secondary | ICD-10-CM

## 2018-10-08 DIAGNOSIS — I5030 Unspecified diastolic (congestive) heart failure: Secondary | ICD-10-CM

## 2018-10-08 DIAGNOSIS — J9601 Acute respiratory failure with hypoxia: Secondary | ICD-10-CM

## 2018-10-08 DIAGNOSIS — R609 Edema, unspecified: Secondary | ICD-10-CM

## 2018-10-08 DIAGNOSIS — N179 Acute kidney failure, unspecified: Secondary | ICD-10-CM

## 2018-10-08 LAB — BASIC METABOLIC PANEL
ANION GAP: 9 (ref 5–15)
ANION GAP: 11 (ref 5–15)
Anion gap: 10 (ref 5–15)
Anion gap: 12 (ref 5–15)
Anion gap: 12 (ref 5–15)
Anion gap: 12 (ref 5–15)
Anion gap: 14 (ref 5–15)
Anion gap: 14 (ref 5–15)
Anion gap: 8 (ref 5–15)
BUN: 66 mg/dL — ABNORMAL HIGH (ref 6–20)
BUN: 67 mg/dL — AB (ref 6–20)
BUN: 69 mg/dL — AB (ref 6–20)
BUN: 69 mg/dL — AB (ref 6–20)
BUN: 69 mg/dL — AB (ref 6–20)
BUN: 69 mg/dL — AB (ref 6–20)
BUN: 69 mg/dL — AB (ref 6–20)
BUN: 70 mg/dL — AB (ref 6–20)
BUN: 71 mg/dL — AB (ref 6–20)
CALCIUM: 9.6 mg/dL (ref 8.9–10.3)
CALCIUM: 9.4 mg/dL (ref 8.9–10.3)
CALCIUM: 9.5 mg/dL (ref 8.9–10.3)
CALCIUM: 9.5 mg/dL (ref 8.9–10.3)
CALCIUM: 9.7 mg/dL (ref 8.9–10.3)
CHLORIDE: 108 mmol/L (ref 98–111)
CHLORIDE: 109 mmol/L (ref 98–111)
CHLORIDE: 109 mmol/L (ref 98–111)
CO2: 21 mmol/L — ABNORMAL LOW (ref 22–32)
CO2: 21 mmol/L — ABNORMAL LOW (ref 22–32)
CO2: 21 mmol/L — ABNORMAL LOW (ref 22–32)
CO2: 22 mmol/L (ref 22–32)
CO2: 22 mmol/L (ref 22–32)
CO2: 22 mmol/L (ref 22–32)
CO2: 22 mmol/L (ref 22–32)
CO2: 23 mmol/L (ref 22–32)
CO2: 24 mmol/L (ref 22–32)
CREATININE: 4.01 mg/dL — AB (ref 0.44–1.00)
CREATININE: 4.13 mg/dL — AB (ref 0.44–1.00)
CREATININE: 3.92 mg/dL — AB (ref 0.44–1.00)
CREATININE: 3.87 mg/dL — AB (ref 0.44–1.00)
Calcium: 9.5 mg/dL (ref 8.9–10.3)
Calcium: 9.6 mg/dL (ref 8.9–10.3)
Calcium: 9.6 mg/dL (ref 8.9–10.3)
Calcium: 9.6 mg/dL (ref 8.9–10.3)
Chloride: 108 mmol/L (ref 98–111)
Chloride: 108 mmol/L (ref 98–111)
Chloride: 109 mmol/L (ref 98–111)
Chloride: 109 mmol/L (ref 98–111)
Chloride: 109 mmol/L (ref 98–111)
Chloride: 109 mmol/L (ref 98–111)
Creatinine, Ser: 3.81 mg/dL — ABNORMAL HIGH (ref 0.44–1.00)
Creatinine, Ser: 4.02 mg/dL — ABNORMAL HIGH (ref 0.44–1.00)
Creatinine, Ser: 4.03 mg/dL — ABNORMAL HIGH (ref 0.44–1.00)
Creatinine, Ser: 4.06 mg/dL — ABNORMAL HIGH (ref 0.44–1.00)
Creatinine, Ser: 4.08 mg/dL — ABNORMAL HIGH (ref 0.44–1.00)
GFR calc Af Amer: 13 mL/min — ABNORMAL LOW (ref >60)
GFR calc Af Amer: 13 mL/min — ABNORMAL LOW (ref >60)
GFR calc Af Amer: 13 mL/min — ABNORMAL LOW (ref >60)
GFR calc Af Amer: 13 mL/min — ABNORMAL LOW (ref >60)
GFR calc Af Amer: 13 mL/min — ABNORMAL LOW (ref >60)
GFR calc Af Amer: 13 mL/min — ABNORMAL LOW (ref >60)
GFR calc Af Amer: 14 mL/min — ABNORMAL LOW (ref >60)
GFR calc non Af Amer: 12 mL/min — ABNORMAL LOW (ref >60)
GFR, EST AFRICAN AMERICAN: 13 mL/min — AB (ref >60)
GFR, EST AFRICAN AMERICAN: 14 mL/min — AB (ref >60)
GFR, EST NON AFRICAN AMERICAN: 11 mL/min — AB (ref >60)
GFR, EST NON AFRICAN AMERICAN: 11 mL/min — AB (ref >60)
GFR, EST NON AFRICAN AMERICAN: 11 mL/min — AB (ref >60)
GFR, EST NON AFRICAN AMERICAN: 11 mL/min — AB (ref >60)
GFR, EST NON AFRICAN AMERICAN: 11 mL/min — AB (ref >60)
GFR, EST NON AFRICAN AMERICAN: 11 mL/min — AB (ref >60)
GFR, EST NON AFRICAN AMERICAN: 12 mL/min — AB (ref >60)
GFR, EST NON AFRICAN AMERICAN: 12 mL/min — AB (ref >60)
GLUCOSE: 118 mg/dL — AB (ref 70–99)
GLUCOSE: 126 mg/dL — AB (ref 70–99)
GLUCOSE: 134 mg/dL — AB (ref 70–99)
GLUCOSE: 155 mg/dL — AB (ref 70–99)
GLUCOSE: 160 mg/dL — AB (ref 70–99)
GLUCOSE: 81 mg/dL (ref 70–99)
Glucose, Bld: 127 mg/dL — ABNORMAL HIGH (ref 70–99)
Glucose, Bld: 112 mg/dL — ABNORMAL HIGH (ref 70–99)
Glucose, Bld: 118 mg/dL — ABNORMAL HIGH (ref 70–99)
Potassium: 3.6 mmol/L (ref 3.5–5.1)
Potassium: 3.7 mmol/L (ref 3.5–5.1)
Potassium: 3.7 mmol/L (ref 3.5–5.1)
Potassium: 3.8 mmol/L (ref 3.5–5.1)
Potassium: 3.9 mmol/L (ref 3.5–5.1)
Potassium: 4 mmol/L (ref 3.5–5.1)
Potassium: 4 mmol/L (ref 3.5–5.1)
Potassium: 4 mmol/L (ref 3.5–5.1)
Potassium: 4.1 mmol/L (ref 3.5–5.1)
SODIUM: 140 mmol/L (ref 135–145)
SODIUM: 141 mmol/L (ref 135–145)
SODIUM: 142 mmol/L (ref 135–145)
SODIUM: 143 mmol/L (ref 135–145)
SODIUM: 143 mmol/L (ref 135–145)
Sodium: 142 mmol/L (ref 135–145)
Sodium: 142 mmol/L (ref 135–145)
Sodium: 143 mmol/L (ref 135–145)
Sodium: 142 mmol/L (ref 135–145)

## 2018-10-08 LAB — PROCALCITONIN: Procalcitonin: 0.16 ng/mL

## 2018-10-08 LAB — PROTIME-INR
INR: 1.13
INR: 1.13
Prothrombin Time: 14.4 seconds (ref 11.4–15.2)
Prothrombin Time: 14.4 seconds (ref 11.4–15.2)

## 2018-10-08 LAB — GLUCOSE, CAPILLARY
GLUCOSE-CAPILLARY: 156 mg/dL — AB (ref 70–99)
GLUCOSE-CAPILLARY: 143 mg/dL — AB (ref 70–99)
GLUCOSE-CAPILLARY: 146 mg/dL — AB (ref 70–99)
GLUCOSE-CAPILLARY: 137 mg/dL — AB (ref 70–99)
GLUCOSE-CAPILLARY: 105 mg/dL — AB (ref 70–99)
GLUCOSE-CAPILLARY: 102 mg/dL — AB (ref 70–99)
Glucose-Capillary: 136 mg/dL — ABNORMAL HIGH (ref 70–99)
Glucose-Capillary: 112 mg/dL — ABNORMAL HIGH (ref 70–99)
Glucose-Capillary: 113 mg/dL — ABNORMAL HIGH (ref 70–99)
Glucose-Capillary: 145 mg/dL — ABNORMAL HIGH (ref 70–99)
Glucose-Capillary: 113 mg/dL — ABNORMAL HIGH (ref 70–99)
Glucose-Capillary: 118 mg/dL — ABNORMAL HIGH (ref 70–99)
Glucose-Capillary: 80 mg/dL (ref 70–99)
Glucose-Capillary: 88 mg/dL (ref 70–99)
Glucose-Capillary: 107 mg/dL — ABNORMAL HIGH (ref 70–99)
Glucose-Capillary: 105 mg/dL — ABNORMAL HIGH (ref 70–99)
Glucose-Capillary: 105 mg/dL — ABNORMAL HIGH (ref 70–99)

## 2018-10-08 LAB — CBC
HEMATOCRIT: 33 % — AB (ref 36.0–46.0)
Hemoglobin: 9.5 g/dL — ABNORMAL LOW (ref 12.0–15.0)
MCH: 25.3 pg — ABNORMAL LOW (ref 26.0–34.0)
MCHC: 28.8 g/dL — AB (ref 30.0–36.0)
MCV: 88 fL (ref 80.0–100.0)
Platelets: 199 10*3/uL (ref 150–400)
RBC: 3.75 MIL/uL — ABNORMAL LOW (ref 3.87–5.11)
RDW: 15.4 % (ref 11.5–15.5)
WBC: 10.3 10*3/uL (ref 4.0–10.5)
nRBC: 0 % (ref 0.0–0.2)

## 2018-10-08 LAB — TROPONIN I
TROPONIN I: 0.1 ng/mL — AB (ref <0.03)
TROPONIN I: 0.08 ng/mL — AB (ref <0.03)
TROPONIN I: 0.06 ng/mL — AB (ref <0.03)
Troponin I: 0.08 ng/mL (ref <0.03)

## 2018-10-08 LAB — POCT I-STAT, CHEM 8
BUN: 65 mg/dL — ABNORMAL HIGH (ref 6–20)
CREATININE: 4.2 mg/dL — AB (ref 0.44–1.00)
Calcium, Ion: 1.27 mmol/L (ref 1.15–1.40)
Chloride: 109 mmol/L (ref 98–111)
Glucose, Bld: 160 mg/dL — ABNORMAL HIGH (ref 70–99)
HEMATOCRIT: 31 % — AB (ref 36.0–46.0)
HEMOGLOBIN: 10.5 g/dL — AB (ref 12.0–15.0)
POTASSIUM: 3.8 mmol/L (ref 3.5–5.1)
SODIUM: 145 mmol/L (ref 135–145)
TCO2: 22 mmol/L (ref 22–32)

## 2018-10-08 LAB — MAGNESIUM: MAGNESIUM: 2.2 mg/dL (ref 1.7–2.4)

## 2018-10-08 LAB — ECHOCARDIOGRAM COMPLETE
Height: 62 in
Weight: 5044.12 oz

## 2018-10-08 LAB — TRIGLYCERIDES
TRIGLYCERIDES: 61 mg/dL (ref <150)
Triglycerides: 99 mg/dL (ref <150)

## 2018-10-08 LAB — MRSA PCR SCREENING: MRSA by PCR: NEGATIVE

## 2018-10-08 LAB — HEPARIN LEVEL (UNFRACTIONATED)
HEPARIN UNFRACTIONATED: 0.42 [IU]/mL (ref 0.30–0.70)
Heparin Unfractionated: 0.11 IU/mL — ABNORMAL LOW (ref 0.30–0.70)
Heparin Unfractionated: 0.17 IU/mL — ABNORMAL LOW (ref 0.30–0.70)

## 2018-10-08 LAB — COOXEMETRY PANEL
Carboxyhemoglobin: 1.4 % (ref 0.5–1.5)
Methemoglobin: 1.7 % — ABNORMAL HIGH (ref 0.0–1.5)
O2 Saturation: 89.4 %
TOTAL HEMOGLOBIN: 10.1 g/dL — AB (ref 12.0–16.0)

## 2018-10-08 LAB — HIV ANTIBODY (ROUTINE TESTING W REFLEX): HIV SCREEN 4TH GENERATION: NONREACTIVE

## 2018-10-08 LAB — APTT
aPTT: 29 seconds (ref 24–36)
aPTT: 43 seconds — ABNORMAL HIGH (ref 24–36)

## 2018-10-08 MED ORDER — CHLORHEXIDINE GLUCONATE 0.12% ORAL RINSE (MEDLINE KIT)
15.0000 mL | Freq: Two times a day (BID) | OROMUCOSAL | Status: DC
  Administered 2018-10-08 – 2018-11-01 (×49): 15 mL via OROMUCOSAL

## 2018-10-08 MED ORDER — ORAL CARE MOUTH RINSE
15.0000 mL | OROMUCOSAL | Status: DC
  Administered 2018-10-08 – 2018-11-01 (×238): 15 mL via OROMUCOSAL

## 2018-10-08 MED ORDER — FUROSEMIDE 10 MG/ML IJ SOLN
60.0000 mg | Freq: Once | INTRAMUSCULAR | Status: AC
  Administered 2018-10-08: 60 mg via INTRAVENOUS
  Filled 2018-10-08: qty 6

## 2018-10-08 MED ORDER — INFLUENZA VAC SPLIT QUAD 0.5 ML IM SUSY
0.5000 mL | PREFILLED_SYRINGE | INTRAMUSCULAR | Status: AC
  Administered 2018-10-11: 0.5 mL via INTRAMUSCULAR
  Filled 2018-10-08: qty 0.5

## 2018-10-08 MED ORDER — INSULIN ASPART 100 UNIT/ML ~~LOC~~ SOLN
1.0000 [IU] | SUBCUTANEOUS | Status: DC
  Administered 2018-10-08 – 2018-10-09 (×3): 1 [IU] via SUBCUTANEOUS
  Administered 2018-10-09 (×2): 2 [IU] via SUBCUTANEOUS
  Administered 2018-10-10: 1 [IU] via SUBCUTANEOUS
  Administered 2018-10-10 (×4): 2 [IU] via SUBCUTANEOUS
  Administered 2018-10-11 (×2): 1 [IU] via SUBCUTANEOUS
  Administered 2018-10-11 – 2018-10-12 (×5): 2 [IU] via SUBCUTANEOUS
  Administered 2018-10-12: 1 [IU] via SUBCUTANEOUS
  Administered 2018-10-12 (×4): 2 [IU] via SUBCUTANEOUS
  Administered 2018-10-13: 1 [IU] via SUBCUTANEOUS
  Administered 2018-10-13: 2 [IU] via SUBCUTANEOUS
  Administered 2018-10-13 – 2018-10-14 (×5): 1 [IU] via SUBCUTANEOUS
  Administered 2018-10-14: 2 [IU] via SUBCUTANEOUS
  Administered 2018-10-14: 1 [IU] via SUBCUTANEOUS
  Administered 2018-10-14 – 2018-10-15 (×2): 2 [IU] via SUBCUTANEOUS
  Administered 2018-10-15: 1 [IU] via SUBCUTANEOUS
  Administered 2018-10-15 (×4): 2 [IU] via SUBCUTANEOUS
  Administered 2018-10-16: 1 [IU] via SUBCUTANEOUS
  Administered 2018-10-16: 2 [IU] via SUBCUTANEOUS
  Administered 2018-10-16: 3 [IU] via SUBCUTANEOUS
  Administered 2018-10-16 – 2018-10-17 (×4): 2 [IU] via SUBCUTANEOUS
  Administered 2018-10-17 (×2): 3 [IU] via SUBCUTANEOUS

## 2018-10-08 MED ORDER — "THROMBI-PAD 3""X3"" EX PADS"
1.0000 | MEDICATED_PAD | Freq: Once | CUTANEOUS | Status: DC
  Filled 2018-10-08: qty 1

## 2018-10-08 MED ORDER — PROPOFOL 1000 MG/100ML IV EMUL: 5.0000 ug/kg/min | INTRAVENOUS | Status: DC

## 2018-10-08 NOTE — Progress Notes (Signed)
*  Preliminary Results* Bilateral lower extremity venous duplex completed. Bilateral lower extremities are negative for deep vein thrombosis. There is no evidence of Baker's cyst bilaterally.  10/08/2018 2:57 PM Catherine Williams

## 2018-10-08 NOTE — Progress Notes (Signed)
Dr. Elsworth Soho made aware of potassium level 3.7. MD stated d/t renal status, no replacement needed at this time.

## 2018-10-08 NOTE — Procedures (Signed)
ELECTROENCEPHALOGRAM REPORT   Patient: Catherine Williams       Room #: [facility] EEG No. ID: [id number] Age: 57 y.o.        Sex: female Referring Physician: Hammonds Report Date:  10/08/2018        Interpreting Physician: Alexis Goodell  History: LANDON TRUAX is an 56 y.o. female s/p arrest  Medications:  ASA, Coreg, Fentanyl, Versed, Insulin, Protonix, Crestor, Nimbex, Heparin, Levophed  Conditions of Recording:  This is a 21 channel routine scalp EEG performed with bipolar and monopolar montages arranged in accordance to the international 10/20 system of electrode placement. One channel was dedicated to EKG recording.  The patient is in the intubated, sedated and paralyzed state.  Description:  The background activity is slow and poorly organized.  It consists of a mixture low voltage theta and delta activity.  This activity is diffusely distributed and continuous throughout the recording.  Some poorly formed sleep transients are present as well.  No epileptiform activity is noted.   Hyperventilation and intermittent photic stimulation were not performed   IMPRESSION: This is an abnormal EEG secondary to general background slowing.  This finding is consistent with the patient's medications but may also be seen with a diffuse disturbance that is etiologically nonspecific, but may include a metabolic encephalopathy, among other possibilities.  No epileptiform activity was noted.     Alexis Goodell, MD Neurology [phone] 10/08/2018, 12:32 PM

## 2018-10-08 NOTE — Progress Notes (Signed)
  Echocardiogram 2D Echocardiogram has been performed.  Catherine Williams 10/08/2018, 8:37 AM

## 2018-10-08 NOTE — Progress Notes (Signed)
ANTICOAGULATION CONSULT NOTE - Follow-up Consult  Pharmacy Consult for heparin Indication: chest pain/ACS  Allergies  Allergen Reactions  . Omnipaque [Iohexol] Nausea And Vomiting    Contrast Dye- Effects Kidney's    Patient Measurements: Height: 5' 2"  (157.5 cm) Weight: (!) 315 lb 4.1 oz (143 kg) IBW/kg (Calculated) : 50.1 Heparin Dosing Weight: 86.7 kg  Vital Signs: Temp: 91.4 F (33 C) (10/21 1200) Temp Source: Bladder (10/21 0800) BP: 135/52 (10/21 1200) Pulse Rate: 59 (10/21 1200)  Labs: Recent Labs    10/07/18 1947 10/07/18 1948 10/07/18 2256  10/08/18 0309 10/08/18 0400 10/08/18 0523 10/08/18 0618 10/08/18 0856 10/08/18 1052  HGB 10.2* 11.2*  --   --   --   --  10.5* 9.5*  --   --   HCT 36.3 33.0*  --   --   --   --  31.0* 33.0*  --   --   PLT 227  --   --   --   --   --   --  199  --   --   APTT  --   --  29  --   --   --   --  43*  --   --   LABPROT 13.9  --  14.4  --   --   --   --  14.4  --   --   INR 1.08  --  1.13  --   --   --   --  1.13  --   --   HEPARINUNFRC  --   --   --   --   --  0.11*  --   --   --  0.17*  CREATININE 4.16* 4.80* 4.08*   < > 4.01*  --  4.20* 4.13* 4.03*  --   TROPONINI  --   --  0.08*  --  0.10*  --   --   --  0.08*  --    < > = values in this interval not displayed.    Estimated Creatinine Clearance: 20.7 mL/min (A) (by C-G formula based on SCr of 4.03 mg/dL (H)).   Medical History: Past Medical History:  Diagnosis Date  . Anemia   . Arthritis Dec. 2014   Gout  . Asthma   . CHF (congestive heart failure) ([place])   . COPD (chronic obstructive pulmonary disease) ([place])   . Diabetes mellitus   . GERD (gastroesophageal reflux disease)   . Heart failure   . Hyperlipidemia   . Hypertension   . Kidney disease   . Morbid obesity ([place])   . Peripheral vascular disease ([place])   . Pinched nerve   . Requires supplemental oxygen    as needed  . Sleep apnea    wears cpap  . Wears dentures     Medications:  Scheduled:  .  artificial tears  1 application Both Eyes W8E  . aspirin  81 mg Oral Daily  . carvedilol  6.25 mg Oral BID WC  . chlorhexidine gluconate (MEDLINE KIT)  15 mL Mouth Rinse BID  . fentaNYL (SUBLIMAZE) injection  100 mcg Intravenous Once  . [START ON 10/09/2018] Influenza vac split quadrivalent PF  0.5 mL Intramuscular Tomorrow-1000  . insulin aspart  1-3 Units Subcutaneous Q4H  . ipratropium-albuterol  3 mL Nebulization Q6H  . mouth rinse  15 mL Mouth Rinse 10 times per day  . pantoprazole sodium  40 mg Per Tube Daily  . rosuvastatin  40  mg Per Tube QPM  . THROMBI-PAD  1 each Topical Once    Assessment: 53 yof underwent cardiac arrest on 10/20, no anticoag PTA. Trop 0.03. No significant ST changes on EKG. Currently undergoing therapeutic hypothermia.  Heparin level is subtherapeutic at 0.17. Hgb down to 9.5, plt 199. No s/sx of bleeding. No infusion issues noted.  Goal of Therapy:  Heparin level 0.3-0.7 units/ml Monitor platelets by anticoagulation protocol: Yes   Plan:  Increase heparin infusion to 1200 units/hr Check anti-Xa level in 8 hours and daily while on heparin Continue to monitor H&H and platelets  Jackson Latino, PharmD PGY1 Pharmacy Resident Phone [phone] 10/08/2018     12:13 PM

## 2018-10-08 NOTE — Plan of Care (Signed)
Care plan reviewed.

## 2018-10-08 NOTE — Progress Notes (Signed)
NAME:  Catherine Williams, MRN:  [id number], DOB:  Jan 20, 1961, LOS: 1 ADMISSION DATE:  10/07/2018, CONSULTATION DATE:  10/07/2018 REFERRING MD:  Dr. Benjamine Mola , CHIEF COMPLAINT:  Cardiac Arrest    Brief History   57 year old female presents to ED on 10/20 s/p Witnessed Arrest. Family reports that patient was of normal state of health, this afternoon she was sitting on couch with daughter when she was noted to be breathing "funny", when daughter tired to stimulate patient she was unresponsive at 1830. EMS arrived 1840 and patient was noted to be pulseless. ROSC achieved at 1900 after 2 shocks and CPR. On arrival to ED patient remained unresponsive. LA 1.38. K 4.2. WBC 10.3. BP 174/80. Cardiology consulted. PCCM asked to admit.   Past Medical History  Diastolic HF (EF 40-34, V4QV), COPD, PVD, Chronic Kidney Disease (Placement of Fistula on 10/17)  Significant Hospital Events   10/20 > Presents to ED s/p Cardiac Arrest   Consults: date of consult/date signed off & final recs:  Cardiology 10/20 PCCM 10/20  Procedures (surgical and bedside):  ETT 10/20 >> Left IJ 10/20 >>   Significant Diagnostic Tests:  CXR 10/20 > Heart size is mildly enlarged. Mild vascular congestion. No confluent opacities, effusions or pneumothorax. Suspect left lateral rib fractures, but difficult to visualize. CT Head 10/20 >>neg ECHO 10/21 >> nml LVEF, gr 2 DD EEG 10/21 >>   Micro Data:  Blood 10/20 >> Sputum 10/20 >> U/A 10/20 >>  Antimicrobials:  N/A    Subjective:   She is critically ill, intubated, sedated and paralyzed Undergoing hypothermia protocol Reach goal temperature at 1 AM     Objective   Blood pressure (!) 104/35, pulse (!) 50, temperature (!) 90.9 F (32.7 C), resp. rate (!) 25, height 5\' 2"  (1.575 m), weight (!) 143 kg, SpO2 100 %. CVP:  [9 mmHg-23 mmHg] 10 mmHg  Vent Mode: PRVC FiO2 (%):  [40 %-100 %] 40 % Set Rate:  [16 bmp-24 bmp] 24 bmp Vt Set:  [400 mL] 400 mL PEEP:  [5 cmH20]  5 cmH20 Plateau Pressure:  [23 cmH20-26 cmH20] 23 cmH20   Intake/Output Summary (Last 24 hours) at 10/08/2018 1037 Last data filed at 10/08/2018 1000 Gross per 24 hour  Intake 623.66 ml  Output 2025 ml  Net -1401.34 ml   Filed Weights   10/07/18 2031  Weight: (!) 143 kg    Examination: Obese African-American woman, sedated and paralyzed Being cooled with pads on Decreased breath sounds bilateral Obese abdomen, soft S1-S2 distant, bradycardia on monitor Left upper extremity AV fistula, no thrill 1+ edema  Resolved Hospital Problem list     Assessment & Plan:   Acute Hypercarbic Respiratory Failure suspected secondary to decompensated Heart Failure   H/O COPD, OSA on CPAP at HS  Plan  -Full ventilator support, settings reviewed and adjusted -Lowered trigger -VAP Bundle  -Scheduled Duoneb  V.Fib/PEA Cardiac Arrest -elevated troponin as expected Decompensated Heart Failure  H/O Diastolic HF (EF 95-63, O7FI) -EKG with LVH and QTC 480 Plan  -Cardiac Monitoring  -Cardiology Following > Recommend Heparin gtt -Consider PE, obtain venous duplex, cannot obtain CT Angio -Continue ASA, Crestor, hold Coreg   Acute on Chronic Stage IV Kidney Disease  Baseline Crt 3.0 Recent placement of Fistula   Plan  -Trend BMP  -Replace electrolytes as indicated  -No indications of emergent HD at this time > Consulted Nephrology   Hypoglycemia -resolved H/O DM Plan  -SSI sensitive -Hold home  Lantus  -PRN Dextrose   Acute Encephalopathy in setting of uremia vs hypercarbia vs anoxic injury  -UDS negative  Plan  -Hypothermia Protocol as above > Versed/Fentanyl/Nimbex    Disposition / Summary of Today's Plan 10/08/18    Cause of arrest unclear, need to consider PE versus seizure, await EEG,?  Was hypoglycemia involved. At any rate rewarm at 1 AM tonight     Code Status: FC Family Communication: Family updated at beside, patient is not married and has 3 Adult Daughters    Labs   CBC: Recent Labs  Lab 10/04/18 0708 10/07/18 1947 10/07/18 1948 10/08/18 0523 10/08/18 0618  WBC  --  10.3  --   --  10.3  NEUTROABS  --  7.7  --   --   --   HGB 10.9* 10.2* 11.2* 10.5* 9.5*  HCT 32.0* 36.3 33.0* 31.0* 33.0*  MCV  --  90.1  --   --  88.0  PLT  --  227  --   --  315    Basic Metabolic Panel: Recent Labs  Lab 10/07/18 2256 10/07/18 2359 10/08/18 0056 10/08/18 0309 10/08/18 0523 10/08/18 0618 10/08/18 0856  NA 142 140 141 142 145 142 143  K 4.1 4.0 4.0 4.0 3.8 3.9 3.8  CL 109 109 109 109 109 108 108  CO2 24 23 22  21*  --  22 21*  GLUCOSE 81 126* 127* 160* 160* 155* 134*  BUN 69* 69* 69* 70* 65* 71* 69*  CREATININE 4.08* 4.02* 4.06* 4.01* 4.20* 4.13* 4.03*  CALCIUM 9.7 9.5 9.5 9.5  --  9.4 9.6  MG 2.2  --   --   --   --   --   --    GFR: Estimated Creatinine Clearance: 20.7 mL/min (A) (by C-G formula based on SCr of 4.03 mg/dL (H)). Recent Labs  Lab 10/07/18 1947 10/07/18 1948 10/07/18 2256 10/08/18 0618  PROCALCITON  --   --  <0.10 0.16  WBC 10.3  --   --  10.3  LATICACIDVEN  --  1.38  --   --     Liver Function Tests: Recent Labs  Lab 10/07/18 1947  AST 52*  ALT 25  ALKPHOS 79  BILITOT 0.5  PROT 7.4  ALBUMIN 3.2*   No results for input(s): LIPASE, AMYLASE in the last 168 hours. No results for input(s): AMMONIA in the last 168 hours.  ABG    Component Value Date/Time   PHART 7.299 (L) 10/07/2018 2342   PCO2ART 51.0 (H) 10/07/2018 2342   PO2ART 81.0 (L) 10/07/2018 2342   HCO3 25.5 10/07/2018 2342   TCO2 22 10/08/2018 0523   ACIDBASEDEF 2.0 10/07/2018 2342   O2SAT 89.4 10/08/2018 0049     Coagulation Profile: Recent Labs  Lab 10/07/18 1947 10/07/18 2256 10/08/18 0618  INR 1.08 1.13 1.13    Cardiac Enzymes: Recent Labs  Lab 10/07/18 2256 10/08/18 0309 10/08/18 0856  TROPONINI 0.08* 0.10* 0.08*    HbA1C: Hgb A1c MFr Bld  Date/Time Value Ref Range Status  10/23/2016 02:54 PM 8.0 (H) 4.8 - 5.6 % Final     Comment:    (NOTE)         Pre-diabetes: 5.7 - 6.4         Diabetes: >6.4         Glycemic control for adults with diabetes: <7.0   07/17/2015 02:09 AM 9.4 (H) 4.8 - 5.6 % Final    Comment:    (NOTE)  Pre-diabetes: 5.7 - 6.4         Diabetes: >6.4         Glycemic control for adults with diabetes: <7.0     CBG: Recent Labs  Lab 10/08/18 0308 10/08/18 0407 10/08/18 0521 10/08/18 0623 10/08/18 0754  GLUCAP 156* 143* 145* 146* 137*    Updated daughters at bedside. My critical care time x 74mins  Kara Mead MD. FCCP. [facility] Pager [phone] If no response call [phone]   10/08/2018

## 2018-10-08 NOTE — Progress Notes (Signed)
Progress Note  Patient Name: LEOLA FIORE Date of Encounter: 10/08/2018  Primary Cardiologist: Dr Burt Knack  Subjective   Intubated , cooled and sedated Nurse indicates hemodynamically stable with no arrhythmias   Inpatient Medications    Scheduled Meds: . artificial tears  1 application Both Eyes Y4I  . aspirin  81 mg Oral Daily  . carvedilol  6.25 mg Oral BID WC  . chlorhexidine gluconate (MEDLINE KIT)  15 mL Mouth Rinse BID  . fentaNYL (SUBLIMAZE) injection  100 mcg Intravenous Once  . insulin aspart  1-3 Units Subcutaneous Q4H  . ipratropium-albuterol  3 mL Nebulization Q6H  . mouth rinse  15 mL Mouth Rinse 10 times per day  . pantoprazole sodium  40 mg Per Tube Daily  . rosuvastatin  40 mg Per Tube QPM  . THROMBI-PAD  1 each Topical Once   Continuous Infusions: . sodium chloride    . cisatracurium (NIMBEX) infusion 1 mcg/kg/min (10/08/18 0800)  . fentaNYL infusion INTRAVENOUS 300 mcg/hr (10/08/18 0800)  . heparin 900 Units/hr (10/08/18 0800)  . midazolam (VERSED) infusion 6 mg/hr (10/08/18 0800)  . niCARDipine Stopped (10/08/18 0133)  . norepinephrine (LEVOPHED) Adult infusion 1 mcg/min (10/08/18 0800)   PRN Meds: [COMPLETED] cisatracurium **AND** cisatracurium (NIMBEX) infusion **AND** cisatracurium, fentaNYL, midazolam   Vital Signs    Vitals:   10/08/18 0700 10/08/18 0738 10/08/18 0800 10/08/18 0811  BP:  (!) 102/58 (!) 97/56 (!) 133/50  Pulse: 61 (!) 53 (!) 52 (!) 53  Resp: (!) 0 16 (!) 24 (!) 24  Temp:      TempSrc:      SpO2: 100% 100% 100% 100%  Weight:      Height:        Intake/Output Summary (Last 24 hours) at 10/08/2018 0814 Last data filed at 10/08/2018 0800 Gross per 24 hour  Intake 509.41 ml  Output 1700 ml  Net -1190.59 ml   Filed Weights   10/07/18 2031  Weight: (!) 143 kg    Telemetry    NSR 10/08/2018  - Personally Reviewed  ECG    NSR no acute ischemic changes  - Personally Reviewed  Physical Exam  Obese black female    Intubated and sedated  Neck: No JVD Cardiac: RRR, no murmurs, rubs, or gallops.  Respiratory: Clear to auscultation bilaterally. GI: Soft, nontender, non-distended  MS: No edema; No deformity. Neuro:  Nonfocal  Psych: Normal affect   Labs    Chemistry Recent Labs  Lab 10/07/18 1947  10/08/18 0056 10/08/18 0309 10/08/18 0523 10/08/18 0618  NA 141   < > 141 142 145 142  K 4.2   < > 4.0 4.0 3.8 3.9  CL 109   < > 109 109 109 108  CO2 22   < > 22 21*  --  22  GLUCOSE 75   < > 127* 160* 160* 155*  BUN 68*   < > 69* 70* 65* 71*  CREATININE 4.16*   < > 4.06* 4.01* 4.20* 4.13*  CALCIUM 9.6   < > 9.5 9.5  --  9.4  PROT 7.4  --   --   --   --   --   ALBUMIN 3.2*  --   --   --   --   --   AST 52*  --   --   --   --   --   ALT 25  --   --   --   --   --  ALKPHOS 79  --   --   --   --   --   BILITOT 0.5  --   --   --   --   --   GFRNONAA 11*   < > 11* 11*  --  11*  GFRAA 12*   < > 13* 13*  --  13*  ANIONGAP 10   < > 10 12  --  12   < > = values in this interval not displayed.     Hematology Recent Labs  Lab 10/07/18 1947 10/07/18 1948 10/08/18 0523 10/08/18 0618  WBC 10.3  --   --  10.3  RBC 4.03  --   --  3.75*  HGB 10.2* 11.2* 10.5* 9.5*  HCT 36.3 33.0* 31.0* 33.0*  MCV 90.1  --   --  88.0  MCH 25.3*  --   --  25.3*  MCHC 28.1*  --   --  28.8*  RDW 15.5  --   --  15.4  PLT 227  --   --  199    Cardiac Enzymes Recent Labs  Lab 10/07/18 2256 10/08/18 0309  TROPONINI 0.08* 0.10*    Recent Labs  Lab 10/07/18 1946  TROPIPOC 0.03     BNPNo results for input(s): BNP, PROBNP in the last 168 hours.   DDimer No results for input(s): DDIMER in the last 168 hours.   Radiology    Ct Head Wo Contrast  Result Date: 10/07/2018 CLINICAL DATA:  Altered mental status. EXAM: CT HEAD WITHOUT CONTRAST TECHNIQUE: Contiguous axial images were obtained from the base of the skull through the vertex without intravenous contrast. COMPARISON:  07/02/2015 FINDINGS: Brain: No  acute intracranial abnormality. Specifically, no hemorrhage, hydrocephalus, mass lesion, acute infarction, or significant intracranial injury. Vascular: No hyperdense vessel or unexpected calcification. Skull: No acute calvarial abnormality. Sinuses/Orbits: No acute finding Other: None IMPRESSION: No acute intracranial abnormality. Electronically Signed   By: Rolm Baptise M.D.   On: 10/07/2018 22:24   Dg Chest Port 1 View  Result Date: 10/07/2018 CLINICAL DATA:  Central line placement EXAM: PORTABLE CHEST 1 VIEW COMPARISON:  10/07/2018 FINDINGS: Left central line remains in the upper mediastinum near the confluence of the innominate veins, unchanged. No pneumothorax. Endotracheal tube is 4 cm above the carina. NG tube enters the stomach. Cardiomegaly. No confluent opacities or edema. No effusions or acute bony abnormality. IMPRESSION: Left central line tip remains near the confluence of the innominate veins. No pneumothorax. Cardiomegaly.  No overt edema. Electronically Signed   By: Rolm Baptise M.D.   On: 10/07/2018 21:29   Dg Chest Portable 1 View  Result Date: 10/07/2018 CLINICAL DATA:  Central line placement. EXAM: PORTABLE CHEST 1 VIEW COMPARISON:  Earlier this day at 2019 hour FINDINGS: Tip of the left internal jugular central venous catheter in the region of the brachiocephalic/SVC confluence. No visualized pneumothorax, however the lung apices are not entirely included in the field of view. Endotracheal tube tip at the thoracic inlet 4.7 cm from the carina. Enteric tube in place with tip below the diaphragm. Low lung volumes persist. Unchanged cardiomegaly. Vascular congestion is similar. No large pleural effusion. IMPRESSION: 1. Tip of the left internal jugular central venous catheter in the region of the brachiocephalic/SVC confluence. No visualized pneumothorax. 2. Low lung volumes with unchanged cardiomegaly and vascular congestion. Electronically Signed   By: Keith Rake M.D.   On:  10/07/2018 21:29   Dg Chest Portable 1 View  Result Date:  10/07/2018 CLINICAL DATA:  Post intubation EXAM: PORTABLE CHEST 1 VIEW COMPARISON:  10/07/2018 FINDINGS: Endotracheal tube is 2 cm above the carina. NG tube enters the stomach. Cardiomegaly with vascular congestion, similar to prior study. No confluent opacities, effusions or pneumothorax. No definite visible rib fracture as questioned on prior chest x-ray. IMPRESSION: Endotracheal tube 2 cm above the carina. Mild cardiomegaly, vascular congestion. Electronically Signed   By: Rolm Baptise M.D.   On: 10/07/2018 20:39   Dg Chest Portable 1 View  Result Date: 10/07/2018 CLINICAL DATA:  CPR EXAM: PORTABLE CHEST 1 VIEW COMPARISON:  05/12/2018 FINDINGS: Heart size is mildly enlarged. Mild vascular congestion. No confluent opacities, effusions or pneumothorax. Suspect left lateral rib fractures, but difficult to visualize. IMPRESSION: Cardiomegaly, vascular congestion.  No effusion or pneumothorax. Suspect left lateral rib fractures, but difficult to visualize and characterize. Electronically Signed   By: Rolm Baptise M.D.   On: 10/07/2018 19:56    Cardiac Studies   Preliminary bedside TTE although images poor EF appears normal with no RWMA;s and severe LVH  Patient Profile     57 y.o. female with sudden arrest with long ROSC. No signs of acute MI with no ECG changes and negative Troponin Preliminary TTE with normal EF severe LVH  Assessment & Plan    1. Sudden Arrest:  Cooling per CCM Long down time CT head no acute changes will need neuro evaluation and EEG.  Hemodynamics stable with no arrhythmias. Would consider need to r/o PE  2. CRF:  Has recent left AV fistula could be a source for UE PE may need dialysis to handle volume overload after resuscitation nephrology to see       For questions or updates, please contact [place] Please consult [email or web address] for contact info under        Signed, Jenkins Rouge, MD    10/08/2018, 8:14 AM

## 2018-10-08 NOTE — Progress Notes (Signed)
Initial Nutrition Assessment  DOCUMENTATION CODES:   Morbid obesity  INTERVENTION:  - Once rewarming, recommend Vital High Protein @ 40 mL/hr with 30 mL Prostat TID. This regimen will provide 1260 kcal, 129 grams of protein, and 802 mL free water.    NUTRITION DIAGNOSIS:   Inadequate oral intake related to inability to eat as evidenced by NPO status.  GOAL:   Provide needs based on ASPEN/SCCM guidelines  MONITOR:   Vent status, Weight trends, Labs, Skin  REASON FOR ASSESSMENT:   Ventilator  ASSESSMENT:   57 year old female presents to ED on 10/20 s/p witnessed arrest. Family reports that patient was of normal state of health and on 10/20 afternoon she was sitting on the couch with daughter when she was noted to be breathing "funny." When daughter tried to stimulate patient, she was unresponsive at 1830. EMS arrived 1840 and patient was noted to be pulseless. ROSC achieved at 1900 after 2 shocks and CPR. On arrival to ED patient remained unresponsive.  Patient is intubated, chemically paralyzed, with OGT in place. Patient's daughters are at bedside and report that patient has dentures but that she is able to chew without issue even when she is not wearing them. She does not have any swallowing difficulties. Patient prefers soft, easy-to-chew items and her last meal was 2 veggie burger patties on yesterday afternoon. Patient had drank Glucerna Shakes in the past but not recently.   Daughters report that patient had lost weight (unknown amount) a few months ago and that at a recent doctor's appointment she found that she had gained back 9 lb. It appears that current weight is consistent with weight in 09/2017, that patient then gained weight through 02/08/18, and then lost weight. Weight is now trending back up.  EEG done today and Dr. Doy Mince' note states that results are abnormal.   Per Dr. Bari Mantis note this AM: acute hypercarbic respiratory failure suspected to be 2/2 decompensated  heart failure, V.fib/PEA arrest, acute on CKD stage 4 with no indication of need for emergent HD currently, resolved hypoglycemia, acute encephalopathy in the setting of uremia vs hypercarbia vs anoxic injury. Plan to is to start rewarming at 1:00 AM tonight/Tuesday morning.    Patient is currently intubated on ventilator support MV: 9.6 L/min Temp (24hrs), Avg:92.6 F (33.7 C), Min:90.5 F (32.5 C), Max:96.6 F (35.9 C) Propofol: none BP: 100/69 and MAP: 75  Medications reviewed; 60 mg IV Lasix x1 dose today, sliding scale Novolog. Labs reviewed; CBGs: 88-156 mg/dL since ~0100, BUN: 69 mg/dL, creatinine: 4.03 mg/dL, GFR: 13 mL/min. Drips; nimbex @ 1.5 mcg/kg/min, fentanyl @ 300 mcg/hr, versed @ 6 mg/hr, heparin @ 1200 units/hr.      NUTRITION - FOCUSED PHYSICAL EXAM:  Completed; no muscle and no fat wasting, possible mild edema to extremities but difficult to determine with body habitus.  Diet Order:   Diet Order    None      EDUCATION NEEDS:   No education needs have been identified at this time  Skin:  Skin Assessment: Skin Integrity Issues: Skin Integrity Issues:: Incisions Incisions: L arm (10/17)  Last BM:  PTA/unknown  Height:   Ht Readings from Last 1 Encounters:  10/07/18 5\' 2"  (1.575 m)    Weight:   Wt Readings from Last 1 Encounters:  10/07/18 (!) 143 kg    Ideal Body Weight:  50 kg  BMI:  Body mass index is 57.66 kg/m.  Estimated Nutritional Needs:   Kcal:  1100-1250 (22-25 kcal/kg  IBW)  Protein:  >/= 125 grams (2.5 grams/kg IBW)  Fluid:  >/= 1.5 L/day     Jarome Matin, MS, RD, LDN, [facility] Inpatient Clinical Dietitian Pager # [phone] After hours/weekend pager # [phone]

## 2018-10-08 NOTE — Progress Notes (Signed)
ANTICOAGULATION CONSULT NOTE - Follow Up Consult  Pharmacy Consult for Heparin Indication: chest pain/ACS  Allergies  Allergen Reactions  . Omnipaque [Iohexol] Nausea And Vomiting    Contrast Dye- Effects Kidney's    Patient Measurements: Height: 5\' 2"  (157.5 cm) Weight: (!) 315 lb 4.1 oz (143 kg) IBW/kg (Calculated) : 50.1 Heparin Dosing Weight: 87 kg  Vital Signs: Temp: 91.2 F (32.9 C) (10/21 2200) Temp Source: Core (10/21 2200) BP: 123/58 (10/21 2300) Pulse Rate: 53 (10/21 2300)  Labs: Recent Labs    10/07/18 1947 10/07/18 1948  10/07/18 2256  10/08/18 0309 10/08/18 0400 10/08/18 0523 10/08/18 0618 10/08/18 0856 10/08/18 1052 10/08/18 1309 10/08/18 1708 10/08/18 2109  HGB 10.2* 11.2*  --   --   --   --   --  10.5* 9.5*  --   --   --   --   --   HCT 36.3 33.0*  --   --   --   --   --  31.0* 33.0*  --   --   --   --   --   PLT 227  --   --   --   --   --   --   --  199  --   --   --   --   --   APTT  --   --   --  29  --   --   --   --  43*  --   --   --   --   --   LABPROT 13.9  --   --  14.4  --   --   --   --  14.4  --   --   --   --   --   INR 1.08  --   --  1.13  --   --   --   --  1.13  --   --   --   --   --   HEPARINUNFRC  --   --   --   --   --   --  0.11*  --   --   --  0.17*  --   --  0.42  CREATININE 4.16* 4.80*  --  4.08*   < > 4.01*  --  4.20* 4.13* 4.03*  --  3.92* 3.87* 3.81*  TROPONINI  --   --    < > 0.08*  --  0.10*  --   --   --  0.08*  --   --  0.06*  --    < > = values in this interval not displayed.    Estimated Creatinine Clearance: 21.9 mL/min (A) (by C-G formula based on SCr of 3.81 mg/dL (H)).  Assessment:  48 yof admitted 10/20 with cardiac arrest, no anticoag PTA. Currently undergoing therapeutic hypothermia.    Heparin level is therapeutic tonight (0.41) on 1200 units/hr.  Goal of Therapy:  Heparin level 0.3-0.7 units/ml Monitor platelets by anticoagulation protocol: Yes   Plan:   Continue heparin drip at 1200 units/hr.  Next heparin level and CBC in am.  Arty Baumgartner, [place] Pager: [phone] 10/08/2018,11:07 PM

## 2018-10-08 NOTE — Progress Notes (Signed)
EEG complete - results pending 

## 2018-10-08 NOTE — Progress Notes (Signed)
ANTICOAGULATION CONSULT NOTE - Follow Up Consult  Pharmacy Consult for heparin Indication: s/p Vfib arrest  Labs: Recent Labs    10/07/18 1947 10/07/18 1948 10/07/18 2256 10/07/18 2359 10/08/18 0056 10/08/18 0309 10/08/18 0400  HGB 10.2* 11.2*  --   --   --   --   --   HCT 36.3 33.0*  --   --   --   --   --   PLT 227  --   --   --   --   --   --   APTT  --   --  29  --   --   --   --   LABPROT 13.9  --  14.4  --   --   --   --   INR 1.08  --  1.13  --   --   --   --   HEPARINUNFRC  --   --   --   --   --   --  0.11*  CREATININE 4.16* 4.80* 4.08* 4.02* 4.06* 4.01*  --   TROPONINI  --   --  0.08*  --   --  0.10*  --     Assessment: 57yo female subtherapeutic on heparin with initial dosing during therapeutic hypothermia; of note, bolus and gtt were started late so this lab is more representative of bolus but would expect it to be higher; RN notes some bleeding from new IJ but has slowed w/ pressure dressing.  Goal of Therapy:  Heparin level 0.3-0.7 units/ml   Plan:  Will increase heparin gtt by 2-3 units/kgABW/hr to 900 units/hr and check level in 6 hours.    Wynona Neat, PharmD, BCPS  10/08/2018,5:04 AM

## 2018-10-09 ENCOUNTER — Inpatient Hospital Stay (HOSPITAL_COMMUNITY): Payer: Medicare Other

## 2018-10-09 DIAGNOSIS — J9600 Acute respiratory failure, unspecified whether with hypoxia or hypercapnia: Secondary | ICD-10-CM

## 2018-10-09 DIAGNOSIS — N185 Chronic kidney disease, stage 5: Secondary | ICD-10-CM

## 2018-10-09 LAB — BASIC METABOLIC PANEL
ANION GAP: 13 (ref 5–15)
ANION GAP: 11 (ref 5–15)
ANION GAP: 11 (ref 5–15)
Anion gap: 10 (ref 5–15)
BUN: 65 mg/dL — AB (ref 6–20)
BUN: 63 mg/dL — ABNORMAL HIGH (ref 6–20)
BUN: 64 mg/dL — AB (ref 6–20)
BUN: 64 mg/dL — ABNORMAL HIGH (ref 6–20)
CALCIUM: 9.7 mg/dL (ref 8.9–10.3)
CALCIUM: 9.7 mg/dL (ref 8.9–10.3)
CHLORIDE: 110 mmol/L (ref 98–111)
CO2: 21 mmol/L — ABNORMAL LOW (ref 22–32)
CO2: 22 mmol/L (ref 22–32)
CO2: 22 mmol/L (ref 22–32)
CO2: 23 mmol/L (ref 22–32)
CREATININE: 3.64 mg/dL — AB (ref 0.44–1.00)
Calcium: 9.8 mg/dL (ref 8.9–10.3)
Calcium: 9.8 mg/dL (ref 8.9–10.3)
Chloride: 110 mmol/L (ref 98–111)
Chloride: 109 mmol/L (ref 98–111)
Chloride: 112 mmol/L — ABNORMAL HIGH (ref 98–111)
Creatinine, Ser: 3.69 mg/dL — ABNORMAL HIGH (ref 0.44–1.00)
Creatinine, Ser: 3.68 mg/dL — ABNORMAL HIGH (ref 0.44–1.00)
Creatinine, Ser: 3.8 mg/dL — ABNORMAL HIGH (ref 0.44–1.00)
GFR calc Af Amer: 14 mL/min — ABNORMAL LOW (ref >60)
GFR calc Af Amer: 15 mL/min — ABNORMAL LOW (ref >60)
GFR calc non Af Amer: 13 mL/min — ABNORMAL LOW (ref >60)
GFR, EST AFRICAN AMERICAN: 14 mL/min — AB (ref >60)
GFR, EST AFRICAN AMERICAN: 14 mL/min — AB (ref >60)
GFR, EST NON AFRICAN AMERICAN: 12 mL/min — AB (ref >60)
GFR, EST NON AFRICAN AMERICAN: 13 mL/min — AB (ref >60)
GFR, EST NON AFRICAN AMERICAN: 12 mL/min — AB (ref >60)
GLUCOSE: 100 mg/dL — AB (ref 70–99)
Glucose, Bld: 115 mg/dL — ABNORMAL HIGH (ref 70–99)
Glucose, Bld: 108 mg/dL — ABNORMAL HIGH (ref 70–99)
Glucose, Bld: 163 mg/dL — ABNORMAL HIGH (ref 70–99)
POTASSIUM: 3.6 mmol/L (ref 3.5–5.1)
POTASSIUM: 4.3 mmol/L (ref 3.5–5.1)
Potassium: 3.5 mmol/L (ref 3.5–5.1)
Potassium: 3.6 mmol/L (ref 3.5–5.1)
SODIUM: 144 mmol/L (ref 135–145)
Sodium: 142 mmol/L (ref 135–145)
Sodium: 144 mmol/L (ref 135–145)
Sodium: 144 mmol/L (ref 135–145)

## 2018-10-09 LAB — CBC
HCT: 31.9 % — ABNORMAL LOW (ref 36.0–46.0)
Hemoglobin: 9.5 g/dL — ABNORMAL LOW (ref 12.0–15.0)
MCH: 25.3 pg — ABNORMAL LOW (ref 26.0–34.0)
MCHC: 29.8 g/dL — ABNORMAL LOW (ref 30.0–36.0)
MCV: 85.1 fL (ref 80.0–100.0)
NRBC: 0 % (ref 0.0–0.2)
PLATELETS: 185 10*3/uL (ref 150–400)
RBC: 3.75 MIL/uL — ABNORMAL LOW (ref 3.87–5.11)
RDW: 15.3 % (ref 11.5–15.5)
WBC: 8.6 10*3/uL (ref 4.0–10.5)

## 2018-10-09 LAB — GLUCOSE, CAPILLARY
GLUCOSE-CAPILLARY: 104 mg/dL — AB (ref 70–99)
GLUCOSE-CAPILLARY: 135 mg/dL — AB (ref 70–99)
GLUCOSE-CAPILLARY: 98 mg/dL (ref 70–99)
Glucose-Capillary: 151 mg/dL — ABNORMAL HIGH (ref 70–99)
Glucose-Capillary: 166 mg/dL — ABNORMAL HIGH (ref 70–99)
Glucose-Capillary: 92 mg/dL (ref 70–99)
Glucose-Capillary: 94 mg/dL (ref 70–99)

## 2018-10-09 LAB — HEPARIN LEVEL (UNFRACTIONATED): Heparin Unfractionated: 0.52 IU/mL (ref 0.30–0.70)

## 2018-10-09 LAB — POCT I-STAT 3, ART BLOOD GAS (G3+)
ACID-BASE DEFICIT: 1 mmol/L (ref 0.0–2.0)
BICARBONATE: 24 mmol/L (ref 20.0–28.0)
O2 SAT: 98 %
PO2 ART: 84 mmHg (ref 83.0–108.0)
TCO2: 25 mmol/L (ref 22–32)
pCO2 arterial: 34.3 mmHg (ref 32.0–48.0)
pH, Arterial: 7.44 (ref 7.350–7.450)

## 2018-10-09 LAB — PHOSPHORUS: Phosphorus: 5.1 mg/dL — ABNORMAL HIGH (ref 2.5–4.6)

## 2018-10-09 LAB — MAGNESIUM: Magnesium: 1.8 mg/dL (ref 1.7–2.4)

## 2018-10-09 LAB — PROCALCITONIN: PROCALCITONIN: 0.25 ng/mL

## 2018-10-09 MED ORDER — FUROSEMIDE 10 MG/ML IJ SOLN
40.0000 mg | Freq: Once | INTRAMUSCULAR | Status: AC
  Administered 2018-10-09: 40 mg via INTRAVENOUS
  Filled 2018-10-09: qty 4

## 2018-10-09 MED ORDER — FUROSEMIDE 10 MG/ML IJ SOLN
80.0000 mg | Freq: Once | INTRAMUSCULAR | Status: AC
  Administered 2018-10-09: 80 mg via INTRAVENOUS
  Filled 2018-10-09: qty 8

## 2018-10-09 MED ORDER — PRO-STAT SUGAR FREE PO LIQD
30.0000 mL | Freq: Two times a day (BID) | ORAL | Status: DC
  Administered 2018-10-09 – 2018-10-10 (×3): 30 mL
  Filled 2018-10-09 (×3): qty 30

## 2018-10-09 MED ORDER — VITAL HIGH PROTEIN PO LIQD
1000.0000 mL | ORAL | Status: DC
  Administered 2018-10-09 – 2018-10-18 (×12): 1000 mL

## 2018-10-09 NOTE — Progress Notes (Signed)
Progress Note  Patient Name: Catherine Williams Date of Encounter: 10/09/2018  Primary Cardiologist: Dr Burt Knack  Subjective   Intubated and sedated   Inpatient Medications    Scheduled Meds: . artificial tears  1 application Both Eyes W5I  . aspirin  81 mg Oral Daily  . carvedilol  6.25 mg Oral BID WC  . chlorhexidine gluconate (MEDLINE KIT)  15 mL Mouth Rinse BID  . fentaNYL (SUBLIMAZE) injection  100 mcg Intravenous Once  . Influenza vac split quadrivalent PF  0.5 mL Intramuscular Tomorrow-1000  . insulin aspart  1-3 Units Subcutaneous Q4H  . ipratropium-albuterol  3 mL Nebulization Q6H  . mouth rinse  15 mL Mouth Rinse 10 times per day  . pantoprazole sodium  40 mg Per Tube Daily  . rosuvastatin  40 mg Per Tube QPM  . THROMBI-PAD  1 each Topical Once   Continuous Infusions: . sodium chloride    . cisatracurium (NIMBEX) infusion 1.5 mcg/kg/min (10/09/18 0500)  . fentaNYL infusion INTRAVENOUS 300 mcg/hr (10/09/18 0803)  . heparin 1,200 Units/hr (10/09/18 0500)  . midazolam (VERSED) infusion 6 mg/hr (10/09/18 0742)  . norepinephrine (LEVOPHED) Adult infusion Stopped (10/08/18 1201)   PRN Meds: [COMPLETED] cisatracurium **AND** cisatracurium (NIMBEX) infusion **AND** cisatracurium, fentaNYL, midazolam   Vital Signs    Vitals:   10/09/18 0645 10/09/18 0700 10/09/18 0800 10/09/18 0811  BP:  (!) 117/32 (!) 115/53 (!) 137/48  Pulse: 64 66 67 68  Resp: (!) 24 (!) 24 19 (!) 24  Temp:  (!) 94.5 F (34.7 C) (!) 95 F (35 C)   TempSrc:  Core Core   SpO2: 100% 100% 100% 100%  Weight:      Height:        Intake/Output Summary (Last 24 hours) at 10/09/2018 0850 Last data filed at 10/09/2018 0800 Gross per 24 hour  Intake 1266.37 ml  Output 3035 ml  Net -1768.63 ml   Filed Weights   10/07/18 2031  Weight: (!) 143 kg    Telemetry    NSR 10/09/2018  - Personally Reviewed  ECG    NSR no acute ischemic changes  - Personally Reviewed  Physical Exam  Obese black  female  Intubated and sedated  Neck: No JVD left IJ triple lumen  Cardiac: RRR, no murmurs, rubs, or gallops.  Respiratory: Clear to auscultation bilaterally. GI: Soft, nontender, non-distended  MS: No edema; No deformity. Neuro:  Nonfocal  Psych: Normal affect   Labs    Chemistry Recent Labs  Lab 10/07/18 1947  10/08/18 2109 10/09/18 0208 10/09/18 0505  NA 141   < > 142 144 142  K 4.2   < > 3.6 3.6 3.5  CL 109   < > 109 110 109  CO2 22   < > 22 21* 22  GLUCOSE 75   < > 118* 115* 100*  BUN 68*   < > 66* 65* 63*  CREATININE 4.16*   < > 3.81* 3.69* 3.68*  CALCIUM 9.6   < > 9.6 9.8 9.7  PROT 7.4  --   --   --   --   ALBUMIN 3.2*  --   --   --   --   AST 52*  --   --   --   --   ALT 25  --   --   --   --   ALKPHOS 79  --   --   --   --   BILITOT 0.5  --   --   --   --  GFRNONAA 11*   < > 12* 12* 13*  GFRAA 12*   < > 14* 14* 14*  ANIONGAP 10   < > 11 13 11    < > = values in this interval not displayed.     Hematology Recent Labs  Lab 10/07/18 1947  10/08/18 0523 10/08/18 0618 10/09/18 0505  WBC 10.3  --   --  10.3 8.6  RBC 4.03  --   --  3.75* 3.75*  HGB 10.2*   < > 10.5* 9.5* 9.5*  HCT 36.3   < > 31.0* 33.0* 31.9*  MCV 90.1  --   --  88.0 85.1  MCH 25.3*  --   --  25.3* 25.3*  MCHC 28.1*  --   --  28.8* 29.8*  RDW 15.5  --   --  15.4 15.3  PLT 227  --   --  199 185   < > = values in this interval not displayed.    Cardiac Enzymes Recent Labs  Lab 10/07/18 2256 10/08/18 0309 10/08/18 0856 10/08/18 1708  TROPONINI 0.08* 0.10* 0.08* 0.06*    Recent Labs  Lab 10/07/18 1946  TROPIPOC 0.03     BNPNo results for input(s): BNP, PROBNP in the last 168 hours.   DDimer No results for input(s): DDIMER in the last 168 hours.   Radiology    Ct Head Wo Contrast  Result Date: 10/07/2018 CLINICAL DATA:  Altered mental status. EXAM: CT HEAD WITHOUT CONTRAST TECHNIQUE: Contiguous axial images were obtained from the base of the skull through the vertex  without intravenous contrast. COMPARISON:  07/02/2015 FINDINGS: Brain: No acute intracranial abnormality. Specifically, no hemorrhage, hydrocephalus, mass lesion, acute infarction, or significant intracranial injury. Vascular: No hyperdense vessel or unexpected calcification. Skull: No acute calvarial abnormality. Sinuses/Orbits: No acute finding Other: None IMPRESSION: No acute intracranial abnormality. Electronically Signed   By: Rolm Baptise M.D.   On: 10/07/2018 22:24   Dg Chest Port 1 View  Result Date: 10/07/2018 CLINICAL DATA:  Central line placement EXAM: PORTABLE CHEST 1 VIEW COMPARISON:  10/07/2018 FINDINGS: Left central line remains in the upper mediastinum near the confluence of the innominate veins, unchanged. No pneumothorax. Endotracheal tube is 4 cm above the carina. NG tube enters the stomach. Cardiomegaly. No confluent opacities or edema. No effusions or acute bony abnormality. IMPRESSION: Left central line tip remains near the confluence of the innominate veins. No pneumothorax. Cardiomegaly.  No overt edema. Electronically Signed   By: Rolm Baptise M.D.   On: 10/07/2018 21:29   Dg Chest Portable 1 View  Result Date: 10/07/2018 CLINICAL DATA:  Central line placement. EXAM: PORTABLE CHEST 1 VIEW COMPARISON:  Earlier this day at 2019 hour FINDINGS: Tip of the left internal jugular central venous catheter in the region of the brachiocephalic/SVC confluence. No visualized pneumothorax, however the lung apices are not entirely included in the field of view. Endotracheal tube tip at the thoracic inlet 4.7 cm from the carina. Enteric tube in place with tip below the diaphragm. Low lung volumes persist. Unchanged cardiomegaly. Vascular congestion is similar. No large pleural effusion. IMPRESSION: 1. Tip of the left internal jugular central venous catheter in the region of the brachiocephalic/SVC confluence. No visualized pneumothorax. 2. Low lung volumes with unchanged cardiomegaly and vascular  congestion. Electronically Signed   By: Keith Rake M.D.   On: 10/07/2018 21:29   Dg Chest Portable 1 View  Result Date: 10/07/2018 CLINICAL DATA:  Post intubation EXAM: PORTABLE CHEST 1 VIEW COMPARISON:  10/07/2018 FINDINGS: Endotracheal tube is 2 cm above the carina. NG tube enters the stomach. Cardiomegaly with vascular congestion, similar to prior study. No confluent opacities, effusions or pneumothorax. No definite visible rib fracture as questioned on prior chest x-ray. IMPRESSION: Endotracheal tube 2 cm above the carina. Mild cardiomegaly, vascular congestion. Electronically Signed   By: Rolm Baptise M.D.   On: 10/07/2018 20:39   Dg Chest Portable 1 View  Result Date: 10/07/2018 CLINICAL DATA:  CPR EXAM: PORTABLE CHEST 1 VIEW COMPARISON:  05/12/2018 FINDINGS: Heart size is mildly enlarged. Mild vascular congestion. No confluent opacities, effusions or pneumothorax. Suspect left lateral rib fractures, but difficult to visualize. IMPRESSION: Cardiomegaly, vascular congestion.  No effusion or pneumothorax. Suspect left lateral rib fractures, but difficult to visualize and characterize. Electronically Signed   By: Rolm Baptise M.D.   On: 10/07/2018 19:56    Cardiac Studies   Preliminary bedside TTE although images poor EF appears normal with no RWMA;s and severe LVH  Patient Profile     57 y.o. female with sudden arrest with long ROSC. No signs of acute MI with no ECG changes and negative Troponin Preliminary TTE with normal EF severe LVH  Assessment & Plan    1. Sudden Arrest:  Does not appear to be primary cardiac event. R/O TTE with hyperdynamic EF no RWMA;s and no  Acute ECG changes. CT no acute abnormality and EEG negative for seizure activity Plan per CCM  2. CRF:  Has recent left AV fistula could be a source for UE PE may need dialysis to handle volume overload after resuscitation nephrology to see       For questions or updates, please contact [place] Please  consult [email or web address] for contact info under        Signed, Jenkins Rouge, MD  10/09/2018, 8:50 AM

## 2018-10-09 NOTE — Progress Notes (Signed)
57 year old obese woman with CKD stage IV, recent placement of AV fistula LUE 10/17 admitted 10/20 post cardiac arrest, shockable rhythm. Underwent hypothermia protocol and was rewarmed around 1 PM  On exam-while on sedation and paralytic, unresponsive, pupils 3 mm equally reactive to light, decreased breath sounds bilateral, S1-S2 normal, no rub, 1+ edema, no thrill over left arm  Chest x-ray personally reviewed which shows edema pattern. Labs show normal electrolytes, creatinine stable at 3.8, good gas exchange on ABG. EEG did not show any evidence of seizure  Impression/plan  Cardiac arrest-etiology is unclear, no evidence of DVT, on heparin drip for elevated troponin She was having some hypoglycemic events but had just eaten prior to episode so doubt hypoglycemic seizure  AKI on CKD 4-consult nephrology if worsens, no acute indication for dialysis currently.  At risk anoxic encephalopathy-neuro check when she comes off sedation and paralytic Explained to family that it may take some time for sedation to wear off to allow a good neuro exam  Guarded prognosis given to family. My critical care time x 62m  Kara Mead MD. FCCP. [place] [facility] Pager [phone] If no response call [phone]   10/09/2018

## 2018-10-09 NOTE — Progress Notes (Signed)
NAME:  Catherine Williams, MRN:  [id number], DOB:  22-Jan-1961, LOS: 2 ADMISSION DATE:  10/07/2018, CONSULTATION DATE:  10/07/2018 REFERRING MD:  Dr. Benjamine Mola , CHIEF COMPLAINT:  Cardiac Arrest    Brief History   57 year old female presents to ED on 10/20 s/p Witnessed Arrest. Family reports that patient was of normal state of health, this afternoon she was sitting on couch with daughter when she was noted to be breathing "funny", when daughter tired to stimulate patient she was unresponsive at 1830. EMS arrived 1840 and patient was noted to be pulseless. ROSC achieved at 1900 after 2 shocks and CPR. On arrival to ED patient remained unresponsive. LA 1.38. K 4.2. WBC 10.3. BP 174/80. Cardiology consulted. PCCM asked to admit.   Past Medical History  Diastolic HF (EF 67-34, L9FX), COPD, PVD, Chronic Kidney Disease (Placement of Fistula on 10/17)  Significant Hospital Events   10/20 > Presents to ED s/p Cardiac Arrest   Consults: date of consult/date signed off & final recs:  Cardiology 10/20 PCCM 10/20  Procedures (surgical and bedside):  ETT 10/20 >> Left IJ 10/20 >>   Significant Diagnostic Tests:  CXR 10/20 > Heart size is mildly enlarged. Mild vascular congestion. No confluent opacities, effusions or pneumothorax. Suspect left lateral rib fractures, but difficult to visualize. CT Head 10/20 >>neg ECHO 10/21 >> nml LVEF, gr 2 DD EEG 10/21 >> slowing. No epileptiform activity.  Venous doppler legs 10/21 > no evidence DVT  Micro Data:  Blood 10/20 >> Sputum 10/20 >> U/A 10/20 >>  Antimicrobials:  N/A    Subjective:  Unable due to encephalopathy.   Objective   Blood pressure (!) 115/43, pulse 73, temperature (!) 96.3 F (35.7 C), resp. rate (!) 24, height 5\' 2"  (1.575 m), weight (!) 143 kg, SpO2 100 %. CVP:  [4 mmHg-12 mmHg] 12 mmHg  Vent Mode: PRVC FiO2 (%):  [40 %] 40 % Set Rate:  [24 bmp] 24 bmp Vt Set:  [400 mL] 400 mL PEEP:  [5 [place] Pressure:  [20  cmH20-23 cmH20] 21 cmH20   Intake/Output Summary (Last 24 hours) at 10/09/2018 1049 Last data filed at 10/09/2018 1000 Gross per 24 hour  Intake 1456.35 ml  Output 2725 ml  Net -1268.65 ml   Filed Weights   10/07/18 2031  Weight: (!) 143 kg    Examination: General:  Critically ill appearing obese female Neuro:  Sedated HEENT:  [place]/AT, No JVD noted, PERRL Cardiovascular:  RRR, no MRG Lungs: coarse bilateral breath sounds Abdomen:  Soft, non-distended, hypoactive BS Musculoskeletal:  No acute deformity Skin:  Left upper extremity AV fistula, no thrill  Resolved Hospital Problem list     Assessment & Plan:   Acute Hypercarbic Respiratory Failure suspected secondary to decompensated Heart Failure   H/O COPD, OSA on CPAP at HS  Plan  -Full ventilator support, settings reviewed and adjusted -Lowered trigger -VAP Bundle  -Scheduled Duoneb -Will try some diuresis given worsening infiltrates on CXR (reviewed by me).  -Lasix home dose 80mg  AM and 40mg  PM. Will order as IV  V.Fib/PEA Cardiac Arrest - elevated troponin as expected. No evidence of DVT.  Decompensated Heart Failure  H/O Diastolic HF (EF 90-24, O9BD) -EKG with LVH and QTC 480 Plan  - Cardiac Monitoring  - Cardiology Following > Recommend Heparin gtt - Continue ASA, Crestor, Coreg  - Diuresis as above  Acute on Chronic Stage IV Kidney Disease  Baseline Crt 4-5. Recent placement of  Fistula . Plan  - Trend BMP  - Replace electrolytes as indicated  - Creatinine essentially at baseline with no indications for HD. Consult nephrology if worsens.  Hypoglycemia -resolved H/O DM Plan  -SSI sensitive -Hold home Lantus  -PRN Dextrose   Acute Encephalopathy in setting of uremia vs hypercarbia vs anoxic injury  -UDS negative  Plan  -Hypothermia Protocol as above > Versed/Fentanyl/Nimbex   Disposition / Summary of Today's Plan 10/09/18   Rewarming. Will reach goal temp around 1300. CXR with worsening infiltrates  likely edema. Will restart her lasix at home dose, just with IV formulation.     Diet: Start TF today SUP: Protonix VAP bundle: Yes VTE ppx: heparin ACS dose Code Status: FC Family Communication: family at bedside  Labs   CBC: Recent Labs  Lab 10/07/18 1947 10/07/18 1948 10/08/18 0523 10/08/18 0618 10/09/18 0505  WBC 10.3  --   --  10.3 8.6  NEUTROABS 7.7  --   --   --   --   HGB 10.2* 11.2* 10.5* 9.5* 9.5*  HCT 36.3 33.0* 31.0* 33.0* 31.9*  MCV 90.1  --   --  88.0 85.1  PLT 227  --   --  199 976    Basic Metabolic Panel: Recent Labs  Lab 10/07/18 2256  10/08/18 1309 10/08/18 1708 10/08/18 2109 10/09/18 0208 10/09/18 0505  NA 142   < > 143 143 142 144 142  K 4.1   < > 3.7 3.7 3.6 3.6 3.5  CL 109   < > 108 109 109 110 109  CO2 24   < > 21* 22 22 21* 22  GLUCOSE 81   < > 112* 118* 118* 115* 100*  BUN 69*   < > 69* 67* 66* 65* 63*  CREATININE 4.08*   < > 3.92* 3.87* 3.81* 3.69* 3.68*  CALCIUM 9.7   < > 9.6 9.6 9.6 9.8 9.7  MG 2.2  --   --   --   --   --   --    < > = values in this interval not displayed.   GFR: Estimated Creatinine Clearance: 22.7 mL/min (A) (by C-G formula based on SCr of 3.68 mg/dL (H)). Recent Labs  Lab 10/07/18 1947 10/07/18 1948 10/07/18 2256 10/08/18 0618 10/09/18 0505  PROCALCITON  --   --  <0.10 0.16 0.25  WBC 10.3  --   --  10.3 8.6  LATICACIDVEN  --  1.38  --   --   --     Liver Function Tests: Recent Labs  Lab 10/07/18 1947  AST 52*  ALT 25  ALKPHOS 79  BILITOT 0.5  PROT 7.4  ALBUMIN 3.2*   No results for input(s): LIPASE, AMYLASE in the last 168 hours. No results for input(s): AMMONIA in the last 168 hours.  ABG    Component Value Date/Time   PHART 7.440 10/09/2018 0512   PCO2ART 34.3 10/09/2018 0512   PO2ART 84.0 10/09/2018 0512   HCO3 24.0 10/09/2018 0512   TCO2 25 10/09/2018 0512   ACIDBASEDEF 1.0 10/09/2018 0512   O2SAT 98.0 10/09/2018 0512     Coagulation Profile: Recent Labs  Lab 10/07/18 1947  10/07/18 2256 10/08/18 0618  INR 1.08 1.13 1.13    Cardiac Enzymes: Recent Labs  Lab 10/07/18 2256 10/08/18 0309 10/08/18 0856 10/08/18 1708  TROPONINI 0.08* 0.10* 0.08* 0.06*    HbA1C: Hgb A1c MFr Bld  Date/Time Value Ref Range Status  10/23/2016 02:54 PM 8.0 (H)  4.8 - 5.6 % Final    Comment:    (NOTE)         Pre-diabetes: 5.7 - 6.4         Diabetes: >6.4         Glycemic control for adults with diabetes: <7.0   07/17/2015 02:09 AM 9.4 (H) 4.8 - 5.6 % Final    Comment:    (NOTE)         Pre-diabetes: 5.7 - 6.4         Diabetes: >6.4         Glycemic control for adults with diabetes: <7.0     CBG: Recent Labs  Lab 10/08/18 1803 10/08/18 1952 10/09/18 0006 10/09/18 0410 10/09/18 [place], AGACNP-BC [place] Pager [phone] or [phone]  10/09/2018 11:05 AM

## 2018-10-09 NOTE — Progress Notes (Signed)
ANTICOAGULATION CONSULT NOTE - Follow-up Consult  Pharmacy Consult for heparin Indication: chest pain/ACS  Allergies  Allergen Reactions  . Omnipaque [Iohexol] Nausea And Vomiting    Contrast Dye- Effects Kidney's    Patient Measurements: Height: 5' 2"  (157.5 cm) Weight: (!) 315 lb 4.1 oz (143 kg) IBW/kg (Calculated) : 50.1 Heparin Dosing Weight: 86.7 kg  Vital Signs: Temp: 95 F (35 C) (10/22 0800) Temp Source: Core (10/22 0800) BP: 137/48 (10/22 0811) Pulse Rate: 68 (10/22 0811)  Labs: Recent Labs    10/07/18 1947  10/07/18 2256  10/08/18 0309  10/08/18 0523 10/08/18 0618 10/08/18 0856 10/08/18 1052  10/08/18 1708 10/08/18 2109 10/09/18 0208 10/09/18 0505  HGB 10.2*   < >  --   --   --   --  10.5* 9.5*  --   --   --   --   --   --  9.5*  HCT 36.3   < >  --   --   --   --  31.0* 33.0*  --   --   --   --   --   --  31.9*  PLT 227  --   --   --   --   --   --  199  --   --   --   --   --   --  185  APTT  --   --  29  --   --   --   --  43*  --   --   --   --   --   --   --   LABPROT 13.9  --  14.4  --   --   --   --  14.4  --   --   --   --   --   --   --   INR 1.08  --  1.13  --   --   --   --  1.13  --   --   --   --   --   --   --   HEPARINUNFRC  --   --   --   --   --    < >  --   --   --  0.17*  --   --  0.42  --  0.52  CREATININE 4.16*   < > 4.08*   < > 4.01*  --  4.20* 4.13* 4.03*  --    < > 3.87* 3.81* 3.69* 3.68*  TROPONINI  --    < > 0.08*  --  0.10*  --   --   --  0.08*  --   --  0.06*  --   --   --    < > = values in this interval not displayed.    Estimated Creatinine Clearance: 22.7 mL/min (A) (by C-G formula based on SCr of 3.68 mg/dL (H)).   Medical History: Past Medical History:  Diagnosis Date  . Anemia   . Arthritis Dec. 2014   Gout  . Asthma   . CHF (congestive heart failure) ([place])   . COPD (chronic obstructive pulmonary disease) ([place])   . Diabetes mellitus   . GERD (gastroesophageal reflux disease)   . Heart failure   .  Hyperlipidemia   . Hypertension   . Kidney disease   . Morbid obesity ([place])   . Peripheral vascular disease ([place])   . Pinched nerve   . Requires  supplemental oxygen    as needed  . Sleep apnea    wears cpap  . Wears dentures     Medications:  Scheduled:  . artificial tears  1 application Both Eyes Z6X  . aspirin  81 mg Oral Daily  . carvedilol  6.25 mg Oral BID WC  . chlorhexidine gluconate (MEDLINE KIT)  15 mL Mouth Rinse BID  . fentaNYL (SUBLIMAZE) injection  100 mcg Intravenous Once  . Influenza vac split quadrivalent PF  0.5 mL Intramuscular Tomorrow-1000  . insulin aspart  1-3 Units Subcutaneous Q4H  . ipratropium-albuterol  3 mL Nebulization Q6H  . mouth rinse  15 mL Mouth Rinse 10 times per day  . pantoprazole sodium  40 mg Per Tube Daily  . rosuvastatin  40 mg Per Tube QPM  . THROMBI-PAD  1 each Topical Once    Assessment: 1 yof underwent cardiac arrest on 10/20, no anticoag PTA. Trop 0.03. No significant ST changes on EKG. Was a Code Cool, now undergoing on rewarming.  Heparin level is therapeutic at 0.52 this morning. CBC is stable. No s/sx of bleeding or infusion issues noted per RN.  Goal of Therapy:  Heparin level 0.3-0.7 units/ml Monitor platelets by anticoagulation protocol: Yes   Plan:  Continue heparin infusion at 1200 units/hr Check heparin level and CBC daily while on heparin Continue to monitor H&H and platelets  Jackson Latino, PharmD PGY1 Pharmacy Resident Phone [phone] 10/09/2018     8:20 AM

## 2018-10-10 ENCOUNTER — Other Ambulatory Visit: Payer: Self-pay

## 2018-10-10 DIAGNOSIS — N184 Chronic kidney disease, stage 4 (severe): Secondary | ICD-10-CM

## 2018-10-10 LAB — BASIC METABOLIC PANEL
ANION GAP: 9 (ref 5–15)
Anion gap: 11 (ref 5–15)
Anion gap: 7 (ref 5–15)
BUN: 69 mg/dL — ABNORMAL HIGH (ref 6–20)
BUN: 73 mg/dL — ABNORMAL HIGH (ref 6–20)
BUN: 77 mg/dL — ABNORMAL HIGH (ref 6–20)
CALCIUM: 9.6 mg/dL (ref 8.9–10.3)
CHLORIDE: 110 mmol/L (ref 98–111)
CHLORIDE: 111 mmol/L (ref 98–111)
CO2: 24 mmol/L (ref 22–32)
CO2: 24 mmol/L (ref 22–32)
CO2: 25 mmol/L (ref 22–32)
Calcium: 9.4 mg/dL (ref 8.9–10.3)
Calcium: 9.4 mg/dL (ref 8.9–10.3)
Chloride: 112 mmol/L — ABNORMAL HIGH (ref 98–111)
Creatinine, Ser: 3.82 mg/dL — ABNORMAL HIGH (ref 0.44–1.00)
Creatinine, Ser: 3.82 mg/dL — ABNORMAL HIGH (ref 0.44–1.00)
Creatinine, Ser: 3.86 mg/dL — ABNORMAL HIGH (ref 0.44–1.00)
GFR calc non Af Amer: 12 mL/min — ABNORMAL LOW (ref >60)
GFR calc non Af Amer: 12 mL/min — ABNORMAL LOW (ref >60)
GFR, EST AFRICAN AMERICAN: 14 mL/min — AB (ref >60)
GFR, EST AFRICAN AMERICAN: 14 mL/min — AB (ref >60)
GFR, EST AFRICAN AMERICAN: 14 mL/min — AB (ref >60)
GFR, EST NON AFRICAN AMERICAN: 12 mL/min — AB (ref >60)
Glucose, Bld: 173 mg/dL — ABNORMAL HIGH (ref 70–99)
Glucose, Bld: 185 mg/dL — ABNORMAL HIGH (ref 70–99)
Glucose, Bld: 183 mg/dL — ABNORMAL HIGH (ref 70–99)
POTASSIUM: 4 mmol/L (ref 3.5–5.1)
POTASSIUM: 4 mmol/L (ref 3.5–5.1)
Potassium: 4.6 mmol/L (ref 3.5–5.1)
SODIUM: 145 mmol/L (ref 135–145)
SODIUM: 143 mmol/L (ref 135–145)
Sodium: 145 mmol/L (ref 135–145)

## 2018-10-10 LAB — GLUCOSE, CAPILLARY
GLUCOSE-CAPILLARY: 169 mg/dL — AB (ref 70–99)
GLUCOSE-CAPILLARY: 136 mg/dL — AB (ref 70–99)
GLUCOSE-CAPILLARY: 176 mg/dL — AB (ref 70–99)
Glucose-Capillary: 161 mg/dL — ABNORMAL HIGH (ref 70–99)
Glucose-Capillary: 174 mg/dL — ABNORMAL HIGH (ref 70–99)

## 2018-10-10 LAB — CULTURE, RESPIRATORY W GRAM STAIN

## 2018-10-10 LAB — CULTURE, RESPIRATORY: CULTURE: NORMAL

## 2018-10-10 LAB — HEPARIN LEVEL (UNFRACTIONATED)
Heparin Unfractionated: <0.1 IU/mL — ABNORMAL LOW (ref 0.30–0.70)
Heparin Unfractionated: <0.1 IU/mL — ABNORMAL LOW (ref 0.30–0.70)

## 2018-10-10 LAB — CBC
HEMATOCRIT: 34.5 % — AB (ref 36.0–46.0)
HEMOGLOBIN: 9.5 g/dL — AB (ref 12.0–15.0)
MCH: 24.5 pg — AB (ref 26.0–34.0)
MCHC: 27.5 g/dL — AB (ref 30.0–36.0)
MCV: 88.9 fL (ref 80.0–100.0)
Platelets: 186 10*3/uL (ref 150–400)
RBC: 3.88 MIL/uL (ref 3.87–5.11)
RDW: 15.8 % — ABNORMAL HIGH (ref 11.5–15.5)
WBC: 13.9 10*3/uL — AB (ref 4.0–10.5)
nRBC: 0 % (ref 0.0–0.2)

## 2018-10-10 LAB — MAGNESIUM
MAGNESIUM: 2 mg/dL (ref 1.7–2.4)
Magnesium: 1.9 mg/dL (ref 1.7–2.4)

## 2018-10-10 LAB — PHOSPHORUS
PHOSPHORUS: 4.7 mg/dL — AB (ref 2.5–4.6)
Phosphorus: 4.8 mg/dL — ABNORMAL HIGH (ref 2.5–4.6)

## 2018-10-10 MED ORDER — SODIUM CHLORIDE 0.9% FLUSH: 10.0000 mL | INTRAVENOUS | Status: DC | PRN

## 2018-10-10 MED ORDER — CHLORHEXIDINE GLUCONATE 0.12 % MT SOLN
OROMUCOSAL | Status: AC
  Administered 2018-10-10: 15 mL via OROMUCOSAL
  Filled 2018-10-10: qty 15

## 2018-10-10 MED ORDER — SODIUM CHLORIDE 0.9% FLUSH
10.0000 mL | Freq: Two times a day (BID) | INTRAVENOUS | Status: DC
  Administered 2018-10-10 (×2): 10 mL
  Administered 2018-10-11: 30 mL
  Administered 2018-10-11: 10 mL
  Administered 2018-10-12: 30 mL
  Administered 2018-10-12: 20 mL
  Administered 2018-10-13 – 2018-10-15 (×4): 10 mL
  Administered 2018-10-16: 20 mL
  Administered 2018-10-16 – 2018-10-19 (×6): 10 mL

## 2018-10-10 MED ORDER — CHLORHEXIDINE GLUCONATE CLOTH 2 % EX PADS
6.0000 | MEDICATED_PAD | Freq: Every day | CUTANEOUS | Status: DC
  Administered 2018-10-10 – 2018-10-24 (×15): 6 via TOPICAL

## 2018-10-10 MED ORDER — ACETAMINOPHEN 325 MG PO TABS
650.0000 mg | ORAL_TABLET | Freq: Four times a day (QID) | ORAL | Status: DC | PRN
  Administered 2018-10-10 – 2018-10-14 (×4): 650 mg
  Filled 2018-10-10 (×4): qty 2

## 2018-10-10 MED ORDER — PRO-STAT SUGAR FREE PO LIQD
30.0000 mL | Freq: Three times a day (TID) | ORAL | Status: DC
  Administered 2018-10-10 – 2018-10-19 (×26): 30 mL
  Filled 2018-10-10 (×26): qty 30

## 2018-10-10 NOTE — Progress Notes (Signed)
Approximately 10cc wasted of versed, witnessed by Everlene Other.

## 2018-10-10 NOTE — Progress Notes (Signed)
NAME:  Catherine Williams, MRN:  [id number], DOB:  1961/09/02, LOS: 3 ADMISSION DATE:  10/07/2018, CONSULTATION DATE:  10/07/2018 REFERRING MD:  Dr. Benjamine Mola , CHIEF COMPLAINT:  Cardiac Arrest    Brief History   57 year old female presents to ED on 10/20 s/p Witnessed Arrest. Family reports that patient was of normal state of health, this afternoon she was sitting on couch with daughter when she was noted to be breathing "funny", when daughter tired to stimulate patient she was unresponsive at 1830. EMS arrived 1840 and patient was noted to be pulseless. ROSC achieved at 1900 after 2 shocks and CPR. On arrival to ED patient remained unresponsive. LA 1.38. K 4.2. WBC 10.3. BP 174/80. Cardiology consulted. PCCM asked to admit.   Past Medical History  Diastolic HF (EF 62-37, S2GB), COPD, PVD, Chronic Kidney Disease (Placement of Fistula on 10/17)  Significant Hospital Events   10/20 > Presents to ED s/p Cardiac Arrest  10/22 rewarmed at 1pm  Consults: date of consult/date signed off & final recs:  Cardiology 10/20 PCCM 10/20  Procedures (surgical and bedside):  ETT 10/20 >> Left IJ 10/20 >>   Significant Diagnostic Tests:  CXR 10/20 > Heart size is mildly enlarged. Mild vascular congestion. No confluent opacities, effusions or pneumothorax. Suspect left lateral rib fractures, but difficult to visualize. CT Head 10/20 >>neg ECHO 10/21 >> nml LVEF, gr 2 DD EEG 10/21 >> slowing. No epileptiform activity.  Venous doppler legs 10/21 > no evidence DVT  Micro Data:  Blood 10/20 >> Sputum 10/20 >> U/A 10/20 >>  Antimicrobials:  N/A    Subjective:  Comfortable on vent. Tolerating PSV well.  Objective   Blood pressure (!) 136/36, pulse 80, temperature 99 F (37.2 C), temperature source Core, resp. rate (!) 0, height 5\' 2"  (1.575 m), weight (!) 146.3 kg, SpO2 98 %. CVP:  [11 mmHg-22 mmHg] 14 mmHg  Vent Mode: PSV;CPAP FiO2 (%):  [40 %] 40 % Set Rate:  [24 bmp] 24 bmp Vt Set:  [400 mL] 400  mL PEEP:  [5 cmH20] 5 cmH20 Pressure Support:  [12 cmH20] 12 cmH20 Plateau Pressure:  [18 cmH20-23 cmH20] 21 cmH20   Intake/Output Summary (Last 24 hours) at 10/10/2018 1139 Last data filed at 10/10/2018 1000 Gross per 24 hour  Intake 1576.24 ml  Output 990 ml  Net 586.24 ml   Filed Weights   10/07/18 2031 10/10/18 0100  Weight: (!) 143 kg (!) 146.3 kg    Examination: General:  Critically ill appearing obese female Neuro:  Somnolent but easily awakens to voice, follows basic commands. HEENT:  [place]/AT, No JVD noted, PERRL Cardiovascular:  RRR, no MRG Lungs: coarse bilateral breath sounds Abdomen:  Soft, non-distended, hypoactive BS Musculoskeletal:  No acute deformity Skin:  Left upper extremity AV fistula, no thrill  Resolved Hospital Problem list     Assessment & Plan:   Acute Hypercarbic Respiratory Failure suspected secondary to decompensated Heart Failure   H/O COPD, OSA on CPAP at HS  Plan  -Continue PS wean, once more awake then can likely extubate (hopefully later this afternoon) -VAP Bundle  -Scheduled Duoneb -might need additional lasix  V.Fib/PEA Cardiac Arrest - elevated troponin as expected. No evidence of DVT.  Decompensated Heart Failure  H/O Diastolic HF (EF 15-17, O1YW) -EKG with LVH and QTC 480 Plan  - Cardiac Monitoring  - Cardiology Following > Recommend Heparin gtt - Continue ASA, Crestor, Coreg  - Diuresis as above  Acute on Chronic  Stage IV Kidney Disease  Baseline Crt 4-5. Recent placement of Fistula . Plan  - Trend BMP  - Replace electrolytes as indicated  - Creatinine essentially at baseline with no indications for HD. Consult nephrology if worsens.  Hypoglycemia -resolved H/O DM Plan  -SSI sensitive -Hold home Lantus  -PRN Dextrose   Acute Encephalopathy in setting of uremia vs hypercarbia vs anoxic injury - greatly improved -UDS negative  Plan  -limit sedating meds -hopeful for extubation today then d/c all  sedation  Disposition / Summary of Today's Plan 10/10/18   Rewarming. Will reach goal temp around 1300. CXR with worsening infiltrates likely edema. Will restart her lasix at home dose, just with IV formulation.     Diet: TF SUP: Protonix VAP bundle: Yes VTE ppx: heparin ACS dose Code Status: FC Family Communication: family at bedside  Labs   CBC: Recent Labs  Lab 10/07/18 1947 10/07/18 1948 10/08/18 0523 10/08/18 0618 10/09/18 0505 10/10/18 0422  WBC 10.3  --   --  10.3 8.6 13.9*  NEUTROABS 7.7  --   --   --   --   --   HGB 10.2* 11.2* 10.5* 9.5* 9.5* 9.5*  HCT 36.3 33.0* 31.0* 33.0* 31.9* 34.5*  MCV 90.1  --   --  88.0 85.1 88.9  PLT 227  --   --  199 185 332    Basic Metabolic Panel: Recent Labs  Lab 10/07/18 2256  10/09/18 1008 10/09/18 1631 10/09/18 2328 10/10/18 0422 10/10/18 0959  NA 142   < > 144 144 145 145 143  K 4.1   < > 3.6 4.3 4.6 4.0 4.0  CL 109   < > 112* 110 112* 110 111  CO2 24   < > 22 23 24 24 25   GLUCOSE 81   < > 108* 163* 173* 185* 183*  BUN 69*   < > 64* 64* 69* 73* 77*  CREATININE 4.08*   < > 3.64* 3.80* 3.82* 3.82* 3.86*  CALCIUM 9.7   < > 9.7 9.8 9.6 9.4 9.4  MG 2.2  --   --  1.8  --  1.9  --   PHOS  --   --   --  5.1*  --  4.8*  --    < > = values in this interval not displayed.   GFR: Estimated Creatinine Clearance: 21.9 mL/min (A) (by C-G formula based on SCr of 3.86 mg/dL (H)). Recent Labs  Lab 10/07/18 1947 10/07/18 1948 10/07/18 2256 10/08/18 0618 10/09/18 0505 10/10/18 0422  PROCALCITON  --   --  <0.10 0.16 0.25  --   WBC 10.3  --   --  10.3 8.6 13.9*  LATICACIDVEN  --  1.38  --   --   --   --     Liver Function Tests: Recent Labs  Lab 10/07/18 1947  AST 52*  ALT 25  ALKPHOS 79  BILITOT 0.5  PROT 7.4  ALBUMIN 3.2*   No results for input(s): LIPASE, AMYLASE in the last 168 hours. No results for input(s): AMMONIA in the last 168 hours.  ABG    Component Value Date/Time   PHART 7.440 10/09/2018 0512    PCO2ART 34.3 10/09/2018 0512   PO2ART 84.0 10/09/2018 0512   HCO3 24.0 10/09/2018 0512   TCO2 25 10/09/2018 0512   ACIDBASEDEF 1.0 10/09/2018 0512   O2SAT 98.0 10/09/2018 0512     Coagulation Profile: Recent Labs  Lab 10/07/18 1947 10/07/18 2256 10/08/18  0618  INR 1.08 1.13 1.13    Cardiac Enzymes: Recent Labs  Lab 10/07/18 2256 10/08/18 0309 10/08/18 0856 10/08/18 1708  TROPONINI 0.08* 0.10* 0.08* 0.06*    HbA1C: Hgb A1c MFr Bld  Date/Time Value Ref Range Status  10/23/2016 02:54 PM 8.0 (H) 4.8 - 5.6 % Final    Comment:    (NOTE)         Pre-diabetes: 5.7 - 6.4         Diabetes: >6.4         Glycemic control for adults with diabetes: <7.0   07/17/2015 02:09 AM 9.4 (H) 4.8 - 5.6 % Final    Comment:    (NOTE)         Pre-diabetes: 5.7 - 6.4         Diabetes: >6.4         Glycemic control for adults with diabetes: <7.0     CBG: Recent Labs  Lab 10/09/18 1520 10/09/18 2004 10/09/18 2331 10/10/18 0428 10/10/18 0800  GLUCAP 135* 166* 151* 161* 174*    CC time: 30 min.   [name], [facility] Pager: [phone]  or [phone] 10/10/2018, 12:11 PM

## 2018-10-10 NOTE — Progress Notes (Deleted)
Nutrition Consult/Follow Up  DOCUMENTATION CODES:   Morbid obesity  INTERVENTION:    Initiate TF with Vital HP at goal rate of 40 ml/h (960 ml per day) and Prostat 30 ml TID  Provides 1260 kcals, 129 gm protein, 802 ml free water daily  NUTRITION DIAGNOSIS:   Inadequate oral intake related to inability to eat as evidenced by NPO status, ongoing  GOAL:   Provide needs based on ASPEN/SCCM guidelines, progressing   MONITOR:   TF tolerance, Vent status, Labs, Skin, Weight trends, I & O's  ASSESSMENT:   57 yo Feamle who presented to ED s/p witnessed arrest. Upon EMS's arrival patient was noted to be pulseless. ROSC achieved at 1900 after 2 shocks and CPR. On arrival to ED patient remained unresponsive.  RD consulted for TF initiation & management.  Patient remains intubated on ventilator support Temp (24hrs), Avg:98.6 F (37 C), Min:97.5 F (36.4 C), Max:99.9 F (37.7 C)  Vital High Protein formula started via Adult TF Protocol 10/22. TF currently infusing via OGT at goal rate of 40 ml/hr. Spoke with Urban Gibson, Therapist, sports. Pt tolerating well.  CCM note reviewed. CXR with worsening infiltrates. Likely edema. Pt's PMH includes COPD, diastolic HF and OSA on CPAP at night. Labs and medications reviewed. Phos 4.8 (H). CBG's [phone].  Diet Order:   Diet Order    None     EDUCATION NEEDS:   No education needs have been identified at this time  Skin:  Skin Assessment: Skin Integrity Issues: Skin Integrity Issues:: Incisions Incisions: L arm   Last BM:  10/22   Intake/Output Summary (Last 24 hours) at 10/10/2018 1235 Last data filed at 10/10/2018 1000 Gross per 24 hour  Intake 1459.34 ml  Output 865 ml  Net 594.34 ml   Height:   Ht Readings from Last 1 Encounters:  10/07/18 5\' 2"  (1.575 m)   Weight:   Wt Readings from Last 1 Encounters:  10/10/18 (!) 146.3 kg   Ideal Body Weight:  50 kg  BMI:  Body mass index is 58.99 kg/m.  Estimated Nutritional Needs:    Kcal:  1100-1250  Protein:  >/= 125 gm  Fluid:  per MD  Arthur Holms, RD, LDN Pager #: [phone] After-Hours Pager #: [phone]

## 2018-10-10 NOTE — Progress Notes (Signed)
Nutrition Consult/Follow Up  DOCUMENTATION CODES:   Morbid obesity  INTERVENTION:    Vital HP at goal rate of 40 ml/h (960 ml per day) and Prostat 30 ml TID  Provides 1260 kcals, 129 gm protein, 802 ml free water daily  NUTRITION DIAGNOSIS:   Inadequate oral intake related to inability to eat as evidenced by NPO status, ongoing  GOAL:   Provide needs based on ASPEN/SCCM guidelines, progressing   MONITOR:   TF tolerance, Vent status, Labs, Skin, Weight trends, I & O's  ASSESSMENT:   57 yo Feamle who presented to ED s/p witnessed arrest. Upon EMS's arrival patient was noted to be pulseless. ROSC achieved at 1900 after 2 shocks and CPR. On arrival to ED patient remained unresponsive.  RD consulted for TF initiation & management.  Patient remains intubated on ventilator support Temp (24hrs), Avg:98.6 F (37 C), Min:97.5 F (36.4 C), Max:99.9 F (37.7 C)  Vital High Protein formula started via Adult TF Protocol 10/22. TF currently infusing via OGT at goal rate of 40 ml/hr. Spoke with Urban Gibson, Therapist, sports. Pt tolerating well.  CCM note reviewed. CXR with worsening infiltrates. Likely edema. Pt's PMH includes COPD, diastolic HF and OSA on CPAP at night. Labs and medications reviewed. Phos 4.8 (H). CBG's [phone].  Diet Order:   Diet Order    None     EDUCATION NEEDS:   No education needs have been identified at this time  Skin:  Skin Assessment: Skin Integrity Issues: Skin Integrity Issues:: Incisions Incisions: L arm   Last BM:  10/22   Intake/Output Summary (Last 24 hours) at 10/10/2018 1243 Last data filed at 10/10/2018 1000 Gross per 24 hour  Intake 1459.34 ml  Output 865 ml  Net 594.34 ml   Height:   Ht Readings from Last 1 Encounters:  10/07/18 5\' 2"  (1.575 m)   Weight:   Wt Readings from Last 1 Encounters:  10/10/18 (!) 146.3 kg   Ideal Body Weight:  50 kg  BMI:  Body mass index is 58.99 kg/m.  Estimated Nutritional Needs:   Kcal:   1100-1250  Protein:  >/= 125 gm  Fluid:  per MD  Arthur Holms, RD, LDN Pager #: [phone] After-Hours Pager #: [phone]

## 2018-10-11 LAB — BASIC METABOLIC PANEL
Anion gap: 11 (ref 5–15)
BUN: 94 mg/dL — ABNORMAL HIGH (ref 6–20)
CALCIUM: 10 mg/dL (ref 8.9–10.3)
CHLORIDE: 110 mmol/L (ref 98–111)
CO2: 24 mmol/L (ref 22–32)
CREATININE: 4.32 mg/dL — AB (ref 0.44–1.00)
GFR, EST AFRICAN AMERICAN: 12 mL/min — AB (ref >60)
GFR, EST NON AFRICAN AMERICAN: 10 mL/min — AB (ref >60)
Glucose, Bld: 201 mg/dL — ABNORMAL HIGH (ref 70–99)
Potassium: 4.6 mmol/L (ref 3.5–5.1)
SODIUM: 145 mmol/L (ref 135–145)

## 2018-10-11 LAB — CBC
HCT: 34.8 % — ABNORMAL LOW (ref 36.0–46.0)
Hemoglobin: 9.7 g/dL — ABNORMAL LOW (ref 12.0–15.0)
MCH: 25.1 pg — ABNORMAL LOW (ref 26.0–34.0)
MCHC: 27.9 g/dL — AB (ref 30.0–36.0)
MCV: 90.2 fL (ref 80.0–100.0)
PLATELETS: 201 10*3/uL (ref 150–400)
RBC: 3.86 MIL/uL — ABNORMAL LOW (ref 3.87–5.11)
RDW: 15.7 % — ABNORMAL HIGH (ref 11.5–15.5)
WBC: 18.9 10*3/uL — AB (ref 4.0–10.5)
nRBC: 0 % (ref 0.0–0.2)

## 2018-10-11 LAB — GLUCOSE, CAPILLARY
GLUCOSE-CAPILLARY: 134 mg/dL — AB (ref 70–99)
Glucose-Capillary: 175 mg/dL — ABNORMAL HIGH (ref 70–99)
Glucose-Capillary: 167 mg/dL — ABNORMAL HIGH (ref 70–99)
Glucose-Capillary: 170 mg/dL — ABNORMAL HIGH (ref 70–99)
Glucose-Capillary: 160 mg/dL — ABNORMAL HIGH (ref 70–99)
Glucose-Capillary: 149 mg/dL — ABNORMAL HIGH (ref 70–99)

## 2018-10-11 MED ORDER — POTASSIUM CHLORIDE 20 MEQ/15ML (10%) PO SOLN
20.0000 meq | Freq: Once | ORAL | Status: AC
  Administered 2018-10-11: 20 meq
  Filled 2018-10-11: qty 15

## 2018-10-11 MED ORDER — HEPARIN SODIUM (PORCINE) 5000 UNIT/ML IJ SOLN
5000.0000 [IU] | Freq: Three times a day (TID) | INTRAMUSCULAR | Status: AC
  Administered 2018-10-11 – 2018-10-23 (×37): 5000 [IU] via SUBCUTANEOUS
  Filled 2018-10-11 (×35): qty 1

## 2018-10-11 MED ORDER — FENTANYL CITRATE (PF) 100 MCG/2ML IJ SOLN
25.0000 ug | INTRAMUSCULAR | Status: DC | PRN
  Administered 2018-10-11 – 2018-10-12 (×2): 50 ug via INTRAVENOUS
  Administered 2018-10-12: 100 ug via INTRAVENOUS
  Administered 2018-10-12 (×2): 50 ug via INTRAVENOUS
  Administered 2018-10-14 – 2018-10-15 (×4): 100 ug via INTRAVENOUS
  Administered 2018-10-17: 50 ug via INTRAVENOUS
  Administered 2018-10-17: 100 ug via INTRAVENOUS
  Administered 2018-10-19: 25 ug via INTRAVENOUS
  Administered 2018-10-21: 100 ug via INTRAVENOUS
  Administered 2018-10-21 – 2018-10-23 (×3): 25 ug via INTRAVENOUS
  Administered 2018-10-24: 50 ug via INTRAVENOUS
  Administered 2018-10-25 (×2): 100 ug via INTRAVENOUS
  Administered 2018-10-29: 50 ug via INTRAVENOUS
  Filled 2018-10-11 (×21): qty 2

## 2018-10-11 MED ORDER — FUROSEMIDE 10 MG/ML IJ SOLN
40.0000 mg | Freq: Three times a day (TID) | INTRAMUSCULAR | Status: DC
  Administered 2018-10-11 – 2018-10-12 (×4): 40 mg via INTRAVENOUS
  Filled 2018-10-11 (×4): qty 4

## 2018-10-11 MED ORDER — FUROSEMIDE 10 MG/ML IJ SOLN
40.0000 mg | Freq: Once | INTRAMUSCULAR | Status: AC
  Administered 2018-10-11: 40 mg via INTRAVENOUS
  Filled 2018-10-11: qty 4

## 2018-10-11 NOTE — Progress Notes (Signed)
Pt placed on wean by NP.

## 2018-10-11 NOTE — Progress Notes (Signed)
Wasted 214mls IV fentanyl infusion (concentration 50mcg/ml) with Rubye Beach, RN in med room.   Joellen Jersey, RN

## 2018-10-11 NOTE — Progress Notes (Signed)
NAME:  Catherine Williams, MRN:  [id number], DOB:  [birth year], LOS: 4 ADMISSION DATE:  10/07/2018, CONSULTATION DATE:  10/07/2018 REFERRING MD:  Dr. Benjamine Mola , CHIEF COMPLAINT:  Cardiac Arrest    Brief History   57 year old female presents to ED on 10/20 s/p Witnessed Arrest. Family reports that patient was of normal state of health, this afternoon she was sitting on couch with daughter when she was noted to be breathing "funny", when daughter tired to stimulate patient she was unresponsive at 1830. EMS arrived 1840 and patient was noted to be pulseless. ROSC achieved at 1900 after 2 shocks and CPR. On arrival to ED patient remained unresponsive. LA 1.38. K 4.2. WBC 10.3. BP 174/80. Cardiology consulted. PCCM asked to admit.   Past Medical History  Diastolic HF (EF 35-57, D2KG), COPD, PVD, Chronic Kidney Disease (Placement of Fistula on 10/17)  Significant Hospital Events   10/20 > Presents to ED s/p Cardiac Arrest  10/22 rewarmed at 1pm  Consults: date of consult/date signed off & final recs:  Cardiology 10/20 PCCM 10/20  Procedures (surgical and bedside):  ETT 10/20 >> Left IJ 10/20 >>   Significant Diagnostic Tests:  CXR 10/20 > Heart size is mildly enlarged. Mild vascular congestion. No confluent opacities, effusions or pneumothorax. Suspect left lateral rib fractures, but difficult to visualize. CT Head 10/20 >>neg ECHO 10/21 >> nml LVEF, gr 2 DD EEG 10/21 >> slowing. No epileptiform activity.  Venous doppler legs 10/21 > no evidence DVT  Micro Data:  Blood 10/20 >> Sputum 10/20 >> U/A 10/20 >>   Antimicrobials:  N/A    Subjective:  No acute events. Comfortable on vent. Awake, following intermittent commands.  Objective   Blood pressure (!) 151/45, pulse 89, temperature 99.1 F (37.3 C), temperature source Core, resp. rate (!) 0, height 5\' 2"  (1.575 m), weight (!) 146.3 kg, SpO2 98 %. CVP:  [12 mmHg-14 mmHg] 12 mmHg  Vent Mode: PRVC FiO2 (%):  [40 %] 40 % Set Rate:   [24 bmp] 24 bmp Vt Set:  [400 mL] 400 mL PEEP:  [5 cmH20] 5 cmH20 Pressure Support:  [12 cmH20] 12 cmH20 Plateau Pressure:  [20 cmH20-26 cmH20] 23 cmH20   Intake/Output Summary (Last 24 hours) at 10/11/2018 0858 Last data filed at 10/11/2018 2542 Gross per 24 hour  Intake 1126.83 ml  Output 752 ml  Net 374.83 ml   Filed Weights   10/07/18 2031 10/10/18 0100  Weight: (!) 143 kg (!) 146.3 kg    Examination:  General:  Obese female, in NAD Neuro:  Somnolent but easily awakens to voice, follows basic commands intermittently. HEENT:  [place]/AT, No JVD noted, PERRL Cardiovascular:  RRR, no MRG Lungs: CTAB Abdomen:  Soft, non-distended, hypoactive BS Musculoskeletal:  No acute deformity Skin:  Left upper extremity AV fistula, no thrill   Assessment & Plan:   Acute Hypercarbic Respiratory Failure suspected secondary to decompensated Heart Failure   H/O COPD, OSA on CPAP at HS  Plan  -Restarted PS wean this AM, attempt to wean from PS 12 / 5 to 5 / 5 and if tolerates then will consider extubation -VAP Bundle  -Scheduled Duoneb -Lasix 40mg  x 1 dose now  V.Fib/PEA Cardiac Arrest - elevated troponin as expected. No evidence of DVT.  Decompensated Heart Failure  H/O Diastolic HF (EF 70-62, B7SE) -EKG with LVH and QTC 480 Plan  - Cardiac Monitoring  - Continue ASA, Crestor, Coreg   Acute on Chronic Stage IV Kidney  Disease  Baseline Crt 4-5. Recent placement of UE Fistula . Plan  - Trend BMP  - Replace electrolytes as indicated  - Creatinine close to baseline with no indications for HD. Consult nephrology if worsens.  Hypoglycemia -resolved H/O DM Plan  -SSI sensitive -Hold home Lantus  -PRN Dextrose   Acute Encephalopathy in setting of uremia vs hypercarbia vs anoxic injury - greatly improved -UDS negative  Plan  -limit sedating meds -hopeful for extubation today then d/c all sedation  Disposition / Summary of Today's Plan 10/11/18   Continue PS wean.  Attempt to get  to 5/5 and hopefully extubate.    Diet: TF SUP: Protonix VAP bundle: Yes VTE ppx: heparin ACS dose Code Status: FC Family Communication: family at bedside  Labs   CBC: Recent Labs  Lab 10/07/18 1947  10/08/18 0523 10/08/18 0618 10/09/18 0505 10/10/18 0422 10/11/18 0425  WBC 10.3  --   --  10.3 8.6 13.9* 18.9*  NEUTROABS 7.7  --   --   --   --   --   --   HGB 10.2*   < > 10.5* 9.5* 9.5* 9.5* 9.7*  HCT 36.3   < > 31.0* 33.0* 31.9* 34.5* 34.8*  MCV 90.1  --   --  88.0 85.1 88.9 90.2  PLT 227  --   --  199 185 186 201   < > = values in this interval not displayed.    Basic Metabolic Panel: Recent Labs  Lab 10/07/18 2256  10/09/18 1631 10/09/18 2328 10/10/18 0422 10/10/18 0959 10/10/18 1609 10/11/18 0425  NA 142   < > 144 145 145 143  --  145  K 4.1   < > 4.3 4.6 4.0 4.0  --  4.6  CL 109   < > 110 112* 110 111  --  110  CO2 24   < > 23 24 24 25   --  24  GLUCOSE 81   < > 163* 173* 185* 183*  --  201*  BUN 69*   < > 64* 69* 73* 77*  --  94*  CREATININE 4.08*   < > 3.80* 3.82* 3.82* 3.86*  --  4.32*  CALCIUM 9.7   < > 9.8 9.6 9.4 9.4  --  10.0  MG 2.2  --  1.8  --  1.9  --  2.0  --   PHOS  --   --  5.1*  --  4.8*  --  4.7*  --    < > = values in this interval not displayed.   GFR: Estimated Creatinine Clearance: 19.6 mL/min (A) (by C-G formula based on SCr of 4.32 mg/dL (H)). Recent Labs  Lab 10/07/18 1948 10/07/18 2256 10/08/18 0618 10/09/18 0505 10/10/18 0422 10/11/18 0425  PROCALCITON  --  <0.10 0.16 0.25  --   --   WBC  --   --  10.3 8.6 13.9* 18.9*  LATICACIDVEN 1.38  --   --   --   --   --     Liver Function Tests: Recent Labs  Lab 10/07/18 1947  AST 52*  ALT 25  ALKPHOS 79  BILITOT 0.5  PROT 7.4  ALBUMIN 3.2*   No results for input(s): LIPASE, AMYLASE in the last 168 hours. No results for input(s): AMMONIA in the last 168 hours.  ABG    Component Value Date/Time   PHART 7.440 10/09/2018 0512   PCO2ART 34.3 10/09/2018 0512   PO2ART  84.0 10/09/2018 0512  HCO3 24.0 10/09/2018 0512   TCO2 25 10/09/2018 0512   ACIDBASEDEF 1.0 10/09/2018 0512   O2SAT 98.0 10/09/2018 0512     Coagulation Profile: Recent Labs  Lab 10/07/18 1947 10/07/18 2256 10/08/18 0618  INR 1.08 1.13 1.13    Cardiac Enzymes: Recent Labs  Lab 10/07/18 2256 10/08/18 0309 10/08/18 0856 10/08/18 1708  TROPONINI 0.08* 0.10* 0.08* 0.06*    HbA1C: Hgb A1c MFr Bld  Date/Time Value Ref Range Status  10/23/2016 02:54 PM 8.0 (H) 4.8 - 5.6 % Final    Comment:    (NOTE)         Pre-diabetes: 5.7 - 6.4         Diabetes: >6.4         Glycemic control for adults with diabetes: <7.0   07/17/2015 02:09 AM 9.4 (H) 4.8 - 5.6 % Final    Comment:    (NOTE)         Pre-diabetes: 5.7 - 6.4         Diabetes: >6.4         Glycemic control for adults with diabetes: <7.0     CBG: Recent Labs  Lab 10/10/18 1152 10/10/18 1603 10/10/18 2050 10/11/18 0009 10/11/18 0438  GLUCAP 169* 136* 176* 134* 175*    CC time: 30 min.   [name], [place] [facility] Pager: [phone]  or [phone] 10/11/2018, 8:58 AM

## 2018-10-12 ENCOUNTER — Inpatient Hospital Stay (HOSPITAL_COMMUNITY): Payer: Medicare Other

## 2018-10-12 DIAGNOSIS — N179 Acute kidney failure, unspecified: Secondary | ICD-10-CM

## 2018-10-12 DIAGNOSIS — T17500A Unspecified foreign body in bronchus causing asphyxiation, initial encounter: Secondary | ICD-10-CM

## 2018-10-12 DIAGNOSIS — J9809 Other diseases of bronchus, not elsewhere classified: Secondary | ICD-10-CM

## 2018-10-12 DIAGNOSIS — Z978 Presence of other specified devices: Secondary | ICD-10-CM

## 2018-10-12 DIAGNOSIS — Z9911 Dependence on respirator [ventilator] status: Secondary | ICD-10-CM

## 2018-10-12 LAB — POCT I-STAT 3, ART BLOOD GAS (G3+)
ACID-BASE DEFICIT: 3 mmol/L — AB (ref 0.0–2.0)
Acid-Base Excess: 3 mmol/L — ABNORMAL HIGH (ref 0.0–2.0)
BICARBONATE: 27.1 mmol/L (ref 20.0–28.0)
BICARBONATE: 29.6 mmol/L — AB (ref 20.0–28.0)
O2 SAT: 77 %
O2 Saturation: 100 %
PH ART: 7.301 — AB (ref 7.350–7.450)
PO2 ART: 56 mmHg — AB (ref 83.0–108.0)
TCO2: 29 mmol/L (ref 22–32)
TCO2: 31 mmol/L (ref 22–32)
pCO2 arterial: 80.5 mmHg (ref 32.0–48.0)
pCO2 arterial: 60.1 mmHg — ABNORMAL HIGH (ref 32.0–48.0)
pH, Arterial: 7.134 — CL (ref 7.350–7.450)
pO2, Arterial: 351 mmHg — ABNORMAL HIGH (ref 83.0–108.0)

## 2018-10-12 LAB — COMPREHENSIVE METABOLIC PANEL
ALBUMIN: 2.3 g/dL — AB (ref 3.5–5.0)
ALT: 42 U/L (ref 0–44)
AST: 75 U/L — AB (ref 15–41)
Alkaline Phosphatase: 94 U/L (ref 38–126)
Anion gap: 11 (ref 5–15)
BILIRUBIN TOTAL: 0.6 mg/dL (ref 0.3–1.2)
BUN: 142 mg/dL — AB (ref 6–20)
CO2: 25 mmol/L (ref 22–32)
CREATININE: 5.71 mg/dL — AB (ref 0.44–1.00)
Calcium: 10.1 mg/dL (ref 8.9–10.3)
Chloride: 107 mmol/L (ref 98–111)
GFR calc Af Amer: 9 mL/min — ABNORMAL LOW (ref >60)
GFR, EST NON AFRICAN AMERICAN: 7 mL/min — AB (ref >60)
Glucose, Bld: 240 mg/dL — ABNORMAL HIGH (ref 70–99)
Potassium: 4.9 mmol/L (ref 3.5–5.1)
Sodium: 143 mmol/L (ref 135–145)
TOTAL PROTEIN: 6.8 g/dL (ref 6.5–8.1)

## 2018-10-12 LAB — GLUCOSE, CAPILLARY
GLUCOSE-CAPILLARY: 140 mg/dL — AB (ref 70–99)
GLUCOSE-CAPILLARY: 183 mg/dL — AB (ref 70–99)
Glucose-Capillary: 151 mg/dL — ABNORMAL HIGH (ref 70–99)
Glucose-Capillary: 165 mg/dL — ABNORMAL HIGH (ref 70–99)
Glucose-Capillary: 167 mg/dL — ABNORMAL HIGH (ref 70–99)
Glucose-Capillary: 172 mg/dL — ABNORMAL HIGH (ref 70–99)
Glucose-Capillary: 91 mg/dL (ref 70–99)

## 2018-10-12 LAB — CBC
HCT: 32 % — ABNORMAL LOW (ref 36.0–46.0)
HEMATOCRIT: 34.1 % — AB (ref 36.0–46.0)
HEMOGLOBIN: 9.2 g/dL — AB (ref 12.0–15.0)
HEMOGLOBIN: 8.9 g/dL — AB (ref 12.0–15.0)
MCH: 24.5 pg — AB (ref 26.0–34.0)
MCH: 25.2 pg — ABNORMAL LOW (ref 26.0–34.0)
MCHC: 27 g/dL — ABNORMAL LOW (ref 30.0–36.0)
MCHC: 27.8 g/dL — ABNORMAL LOW (ref 30.0–36.0)
MCV: 90.9 fL (ref 80.0–100.0)
MCV: 90.7 fL (ref 80.0–100.0)
NRBC: 0 % (ref 0.0–0.2)
Platelets: 218 10*3/uL (ref 150–400)
Platelets: 233 10*3/uL (ref 150–400)
RBC: 3.75 MIL/uL — AB (ref 3.87–5.11)
RBC: 3.53 MIL/uL — AB (ref 3.87–5.11)
RDW: 15.8 % — ABNORMAL HIGH (ref 11.5–15.5)
RDW: 15.8 % — ABNORMAL HIGH (ref 11.5–15.5)
WBC: 20.5 10*3/uL — ABNORMAL HIGH (ref 4.0–10.5)
WBC: 18.6 10*3/uL — AB (ref 4.0–10.5)
nRBC: 0 % (ref 0.0–0.2)

## 2018-10-12 LAB — EXPECTORATED SPUTUM ASSESSMENT W GRAM STAIN, RFLX TO RESP C

## 2018-10-12 LAB — BASIC METABOLIC PANEL
ANION GAP: 12 (ref 5–15)
BUN: 124 mg/dL — AB (ref 6–20)
CO2: 23 mmol/L (ref 22–32)
Calcium: 10.2 mg/dL (ref 8.9–10.3)
Chloride: 108 mmol/L (ref 98–111)
Creatinine, Ser: 4.91 mg/dL — ABNORMAL HIGH (ref 0.44–1.00)
GFR calc Af Amer: 10 mL/min — ABNORMAL LOW (ref >60)
GFR, EST NON AFRICAN AMERICAN: 9 mL/min — AB (ref >60)
GLUCOSE: 164 mg/dL — AB (ref 70–99)
POTASSIUM: 5 mmol/L (ref 3.5–5.1)
SODIUM: 143 mmol/L (ref 135–145)

## 2018-10-12 LAB — PHOSPHORUS
Phosphorus: 6.3 mg/dL — ABNORMAL HIGH (ref 2.5–4.6)
Phosphorus: 6.2 mg/dL — ABNORMAL HIGH (ref 2.5–4.6)

## 2018-10-12 LAB — CULTURE, BLOOD (ROUTINE X 2)
Culture: NO GROWTH
SPECIAL REQUESTS: ADEQUATE

## 2018-10-12 LAB — MAGNESIUM
MAGNESIUM: 2.2 mg/dL (ref 1.7–2.4)
Magnesium: 2.1 mg/dL (ref 1.7–2.4)

## 2018-10-12 LAB — EXPECTORATED SPUTUM ASSESSMENT W REFEX TO RESP CULTURE

## 2018-10-12 MED ORDER — NOREPINEPHRINE 4 MG/250ML-% IV SOLN
0.0000 ug/min | INTRAVENOUS | Status: DC
  Filled 2018-10-12: qty 250

## 2018-10-12 MED ORDER — SODIUM CHLORIDE 0.9 % IJ SOLN
250.0000 [IU]/h | INTRAMUSCULAR | Status: DC
  Administered 2018-10-12: 250 [IU]/h via INTRAVENOUS_CENTRAL
  Administered 2018-10-13: 1600 [IU]/h via INTRAVENOUS_CENTRAL
  Administered 2018-10-13 (×2): 1850 [IU]/h via INTRAVENOUS_CENTRAL
  Administered 2018-10-13: 1100 [IU]/h via INTRAVENOUS_CENTRAL
  Administered 2018-10-13: 1600 [IU]/h via INTRAVENOUS_CENTRAL
  Administered 2018-10-14: 1750 [IU]/h via INTRAVENOUS_CENTRAL
  Administered 2018-10-14 (×3): 1850 [IU]/h via INTRAVENOUS_CENTRAL
  Administered 2018-10-15 (×5): 1750 [IU]/h via INTRAVENOUS_CENTRAL
  Administered 2018-10-16: 1500 [IU]/h via INTRAVENOUS_CENTRAL
  Administered 2018-10-16 (×2): 1750 [IU]/h via INTRAVENOUS_CENTRAL
  Administered 2018-10-16: 1500 [IU]/h via INTRAVENOUS_CENTRAL
  Administered 2018-10-16: 1650 [IU]/h via INTRAVENOUS_CENTRAL
  Administered 2018-10-17: 1500 [IU]/h via INTRAVENOUS_CENTRAL
  Filled 2018-10-12 (×24): qty 2

## 2018-10-12 MED ORDER — PRISMASOL BGK 4/2.5 32-4-2.5 MEQ/L IV SOLN
INTRAVENOUS | Status: DC
  Administered 2018-10-12 – 2018-10-13 (×2): via INTRAVENOUS_CENTRAL
  Administered 2018-10-13: 1 via INTRAVENOUS_CENTRAL
  Administered 2018-10-14 – 2018-10-16 (×7): via INTRAVENOUS_CENTRAL
  Filled 2018-10-12 (×15): qty 5000

## 2018-10-12 MED ORDER — HEPARIN SODIUM (PORCINE) 1000 UNIT/ML IJ SOLN
2400.0000 [IU] | Freq: Once | INTRAMUSCULAR | Status: AC
  Administered 2018-10-12: 2400 [IU] via INTRAVENOUS
  Filled 2018-10-12: qty 3

## 2018-10-12 MED ORDER — HEPARIN SODIUM (PORCINE) 1000 UNIT/ML DIALYSIS
1000.0000 [IU] | INTRAMUSCULAR | Status: DC | PRN
  Administered 2018-10-17: 3000 [IU] via INTRAVENOUS_CENTRAL
  Filled 2018-10-12: qty 6
  Filled 2018-10-12: qty 4
  Filled 2018-10-12 (×2): qty 6
  Filled 2018-10-12: qty 2
  Filled 2018-10-12: qty 3

## 2018-10-12 MED ORDER — FENTANYL 2500MCG IN NS 250ML (10MCG/ML) PREMIX INFUSION
0.0000 ug/h | INTRAVENOUS | Status: DC
  Administered 2018-10-12: 50 ug/h via INTRAVENOUS
  Administered 2018-10-13: 100 ug/h via INTRAVENOUS
  Filled 2018-10-12: qty 250

## 2018-10-12 MED ORDER — HEPARIN (PORCINE) 2000 UNITS/L FOR CRRT
INTRAVENOUS_CENTRAL | Status: DC | PRN
  Administered 2018-10-12: 22:00:00 via INTRAVENOUS_CENTRAL
  Filled 2018-10-12: qty 1000

## 2018-10-12 MED ORDER — PRISMASOL BGK 4/2.5 32-4-2.5 MEQ/L IV SOLN
INTRAVENOUS | Status: DC
  Administered 2018-10-12 – 2018-10-16 (×6): via INTRAVENOUS_CENTRAL
  Filled 2018-10-12 (×9): qty 5000

## 2018-10-12 MED ORDER — MIDAZOLAM HCL 2 MG/2ML IJ SOLN
INTRAMUSCULAR | Status: AC
  Administered 2018-10-12: 2 mg
  Filled 2018-10-12: qty 2

## 2018-10-12 MED ORDER — NOREPINEPHRINE 16 MG/250ML-% IV SOLN
0.0000 ug/min | INTRAVENOUS | Status: DC
  Administered 2018-10-12: 10 ug/min via INTRAVENOUS
  Filled 2018-10-12: qty 250

## 2018-10-12 MED ORDER — PRISMASOL BGK 4/2.5 32-4-2.5 MEQ/L IV SOLN
INTRAVENOUS | Status: DC
  Administered 2018-10-12 – 2018-10-17 (×19): via INTRAVENOUS_CENTRAL
  Filled 2018-10-12 (×36): qty 5000

## 2018-10-12 MED ORDER — SODIUM BICARBONATE 8.4 % IV SOLN
100.0000 meq | Freq: Once | INTRAVENOUS | Status: AC
  Administered 2018-10-12: 100 meq via INTRAVENOUS

## 2018-10-12 MED ORDER — FUROSEMIDE 10 MG/ML IJ SOLN: 20.0000 mg | Freq: Once | INTRAMUSCULAR | Status: DC

## 2018-10-12 MED ORDER — HEPARIN BOLUS VIA INFUSION (CRRT)
1000.0000 [IU] | INTRAVENOUS | Status: DC | PRN
  Administered 2018-10-12 – 2018-10-13 (×3): 1000 [IU] via INTRAVENOUS_CENTRAL
  Filled 2018-10-12 (×2): qty 1000

## 2018-10-12 NOTE — Procedures (Signed)
Arterial Catheter Insertion Procedure Note [name] [id number] 1961/06/02  Procedure: Insertion of Arterial Catheter  Indications: Blood pressure monitoring  Procedure Details Consent: Unable to obtain consent because of emergent medical necessity. Time Out: Verified patient identification, verified procedure, site/side was marked, verified correct patient position, special equipment/implants available, medications/allergies/relevent history reviewed, required imaging and test results available.  Performed  Maximum sterile technique was used including antiseptics, cap, gloves, gown, hand hygiene, mask and sheet. Skin prep: Chlorhexidine; local anesthetic administered 20 gauge catheter was inserted into right radial artery using the Seldinger technique. ULTRASOUND GUIDANCE USED: YES Evaluation Blood flow good; BP tracing good. Complications: No apparent complications.   Catherine Williams 10/12/2018

## 2018-10-12 NOTE — Progress Notes (Signed)
NAME:  Catherine Williams, MRN:  [id number], DOB:  1961-11-09, LOS: 5 ADMISSION DATE:  10/07/2018, CONSULTATION DATE:  10/07/2018 REFERRING MD:  Dr. Benjamine Mola , CHIEF COMPLAINT:  Cardiac Arrest    Brief History   57 year old female presents to ED on 10/20 s/p Witnessed Arrest. Family reports that patient was of normal state of health, this afternoon she was sitting on couch with daughter when she was noted to be breathing "funny", when daughter tired to stimulate patient she was unresponsive at 1830. EMS arrived 1840 and patient was noted to be pulseless. ROSC achieved at 1900 after 2 shocks and CPR. On arrival to ED patient remained unresponsive. LA 1.38. K 4.2. WBC 10.3. BP 174/80. Cardiology consulted. PCCM asked to admit.   Past Medical History  Diastolic HF (EF 22-29, N9GX), COPD, PVD, Chronic Kidney Disease (Placement of Fistula on 10/17)  Significant Hospital Events   10/20 > Presents to ED s/p Cardiac Arrest  10/22 rewarmed at 1pm 10/24 - not awake enough to extubate but did follow commands  Consults: date of consult/date signed off & final recs:  Cardiology 10/20 PCCM 10/20  Procedures (surgical and bedside):  ETT 10/20 >> Left IJ 10/20 >>   Significant Diagnostic Tests:  CXR 10/20 > Heart size is mildly enlarged. Mild vascular congestion. No confluent opacities, effusions or pneumothorax. Suspect left lateral rib fractures, but difficult to visualize. CT Head 10/20 >>neg ECHO 10/21 >> nml LVEF, gr 2 DD EEG 10/21 >> slowing. No epileptiform activity.  Venous doppler legs 10/21 > no evidence DVT  Micro Data:  Blood 10/20 >> Sputum 10/20 >> U/A 10/20 >>   Antimicrobials:  N/A     SUBJECTIVE/OVERNIGHT/INTERVAL HX   10/24 - sister from [place]. [place] at bedside. Daugther at bedside. Both very concerned she is not fully arousable. Per RN/family - can arouse to deep voice and track and nod and follow simple commands very briefly. Then falls asleep. Only 100cc./h urine so far. Per  family - s/p LUE fistula recently in anticipation of future HD needs. Baseline creat 3.8s. Creat up at 4.9mg %. Office renal doc -> Dr Marval Regal.    Objective   Blood pressure (!) 105/48, pulse 88, temperature 99 F (37.2 C), resp. rate (!) 25, height 5\' 2"  (1.575 m), weight (!) 141.5 kg, SpO2 100 %. CVP:  [2 mmHg-9 mmHg] 8 mmHg  Vent Mode: PRVC FiO2 (%):  [40 %] 40 % Set Rate:  [24 bmp] 24 bmp Vt Set:  [400 mL] 400 mL PEEP:  [5 cmH20] 5 cmH20 Plateau Pressure:  [15 cmH20-22 cmH20] 22 cmH20   Intake/Output Summary (Last 24 hours) at 10/12/2018 1212 Last data filed at 10/12/2018 1035 Gross per 24 hour  Intake 910 ml  Output 625 ml  Net 285 ml   Filed Weights   10/07/18 2031 10/10/18 0100 10/12/18 0429  Weight: (!) 143 kg (!) 146.3 kg (!) 141.5 kg    Examination:  General Appearance:  Looks criticall ill OBESE - +. Appears volume oerloaded Head:  Normocephalic, without obvious abnormality, atraumatic Eyes:  PERRL - yes, conjunctiva/corneas - clear     Ears:  Normal external ear canals, both ears Nose:  G tube - no Throat:  ETT TUBE - yes , OG tube - y3s Neck:  Supple,  No enlargement/tenderness/nodules Lungs: Clear to auscultation bilaterally, Ventilator   Synchrony - yes Heart:  S1 and S2 normal, no murmur, CVP - no.  Pressors - no Abdomen:  Soft, no masses,  no organomegaly Genitalia / Rectal:  Not done Extremities:  Extremities- intact, edema + Skin:  ntact in exposed areas .  Neurologic:  Sedation - none (got prn fent 3am and 2.30pm yesterday) -> RASS - -3 .    LABS    PULMONARY Recent Labs  Lab 10/07/18 1948 10/07/18 2155 10/07/18 2315 10/07/18 2342 10/08/18 0049 10/08/18 0523 10/09/18 0512  PHART  --  7.277*  --  7.299*  --   --  7.440  PCO2ART  --  63.2*  --  51.0*  --   --  34.3  PO2ART  --  322.0*  --  81.0*  --   --  84.0  HCO3  --  29.5*  --  25.5  --   --  24.0  TCO2 26 31  --  27  --  22 25  O2SAT  --  100.0 90.5 95.0 89.4  --  98.0     CBC Recent Labs  Lab 10/10/18 0422 10/11/18 0425 10/12/18 0417  HGB 9.5* 9.7* 9.2*  HCT 34.5* 34.8* 34.1*  WBC 13.9* 18.9* 20.5*  PLT 186 201 218    COAGULATION Recent Labs  Lab 10/07/18 1947 10/07/18 2256 10/08/18 0618  INR 1.08 1.13 1.13    CARDIAC   Recent Labs  Lab 10/07/18 2256 10/08/18 0309 10/08/18 0856 10/08/18 1708  TROPONINI 0.08* 0.10* 0.08* 0.06*   No results for input(s): PROBNP in the last 168 hours.   CHEMISTRY Recent Labs  Lab 10/07/18 2256  10/09/18 1631 10/09/18 2328 10/10/18 0422 10/10/18 0959 10/10/18 1609 10/11/18 0425 10/12/18 0417  NA 142   < > 144 145 145 143  --  145 143  K 4.1   < > 4.3 4.6 4.0 4.0  --  4.6 5.0  CL 109   < > 110 112* 110 111  --  110 108  CO2 24   < > 23 24 24 25   --  24 23  GLUCOSE 81   < > 163* 173* 185* 183*  --  201* 164*  BUN 69*   < > 64* 69* 73* 77*  --  94* 124*  CREATININE 4.08*   < > 3.80* 3.82* 3.82* 3.86*  --  4.32* 4.91*  CALCIUM 9.7   < > 9.8 9.6 9.4 9.4  --  10.0 10.2  MG 2.2  --  1.8  --  1.9  --  2.0  --  2.1  PHOS  --   --  5.1*  --  4.8*  --  4.7*  --  6.3*   < > = values in this interval not displayed.   Estimated Creatinine Clearance: 16.9 mL/min (A) (by C-G formula based on SCr of 4.91 mg/dL (H)).   LIVER Recent Labs  Lab 10/07/18 1947 10/07/18 2256 10/08/18 0618  AST 52*  --   --   ALT 25  --   --   ALKPHOS 79  --   --   BILITOT 0.5  --   --   PROT 7.4  --   --   ALBUMIN 3.2*  --   --   INR 1.08 1.13 1.13     INFECTIOUS Recent Labs  Lab 10/07/18 1948 10/07/18 2256 10/08/18 0618 10/09/18 0505  LATICACIDVEN 1.38  --   --   --   PROCALCITON  --  <0.10 0.16 0.25     ENDOCRINE CBG (last 3)  Recent Labs    10/12/18 0351 10/12/18 0801 10/12/18 1133  GLUCAP 140* 167* 151*         IMAGING x48h  - image(s) personally visualized  -   highlighted in bold Dg Chest Port 1 View  Result Date: 10/12/2018 CLINICAL DATA:  Intubation.  Respiratory failure.  EXAM: PORTABLE CHEST 1 VIEW COMPARISON:  10/09/2018. FINDINGS: Endotracheal tube has been withdrawn and its tip is now 2.7 cm above the carina. NG tube noted with tip below left hemidiaphragm. Left IJ line noted with tip over the proximal SVC. Cardiomegaly with bilateral pulmonary venous congestion and infiltrates/edema. No pleural effusion or pneumothorax. IMPRESSION: 1. Endotracheal tube has been withdrawn, its tip is now 2.7 cm above the carina. NG tube and left IJ line stable position. 2. Cardiomegaly with bilateral pulmonary infiltrates/edema. Small left pleural effusion. Similar findings noted on prior exam. Electronically Signed   By: [place]   On: 10/12/2018 10:51     Recent Results (from the past 240 hour(s))  Culture, blood (routine x 2)     Status: None   Collection Time: 10/07/18  9:55 PM  Result Value Ref Range Status   Specimen Description BLOOD RIGHT HAND  Final   Special Requests   Final    BOTTLES DRAWN AEROBIC AND ANAEROBIC Blood Culture adequate volume   Culture   Final    NO GROWTH 5 DAYS Performed at [facility], [address], [place], [place] [address]    Report Status 10/12/2018 FINAL  Final  Culture, blood (routine x 2)     Status: None (Preliminary result)   Collection Time: 10/08/18 12:11 AM  Result Value Ref Range Status   Specimen Description BLOOD CENTRAL LINE  Final   Special Requests   Final    BOTTLES DRAWN AEROBIC AND ANAEROBIC Blood Culture adequate volume   Culture   Final    NO GROWTH 4 DAYS Performed at [facility] [address] [place], [place] [address]    Report Status PENDING  Incomplete  MRSA PCR Screening     Status: None   Collection Time: 10/08/18  3:26 AM  Result Value Ref Range Status   MRSA by PCR NEGATIVE NEGATIVE Final    Comment:        The GeneXpert MRSA Assay (FDA approved for NASAL specimens only), is one component of a comprehensive MRSA colonization surveillance program. It is not intended to  diagnose MRSA infection nor to guide or monitor treatment for MRSA infections. Performed at [facility], [place] [address], [place], [place] [address]   Culture, respiratory (non-expectorated)     Status: None   Collection Time: 10/08/18  8:59 AM  Result Value Ref Range Status   Specimen Description TRACHEAL ASPIRATE  Final   Special Requests NONE  Final   Gram Stain   Final    RARE WBC PRESENT, PREDOMINANTLY PMN RARE GRAM POSITIVE COCCI RARE GRAM POSITIVE RODS    Culture   Final    Consistent with normal respiratory flora. Performed at [facility], [place] [address], [place], [place] [address]    Report Status 10/10/2018 FINAL  Final      Assessment & Plan:   Acute Hypercarbic Respiratory Failure suspected secondary to decompensated Heart Failure   H/O COPD, OSA on CPAP at HS     10/12/2018 - > does NOT meet criteria for SBT/Extubation in setting of Acute Respiratory Failure due to obtunded encephalopathy and ? Volume overload  Plan  - VAP bundle - PRVC  V.Fib/PEA  Cardiac Arrest - elevated troponin as expected. No evidence of DVT.  Decompensated Heart Failure  H/O Diastolic HF (EF 56-97, X4IA) -EKG with LVH and QTC 480   10/12/2018 - maintains BP/HR  Plan  - Cardiac Monitoring  - Continue ASA, Crestor, Coreg   Acute on Chronic Stage IV Kidney Disease  Baseline Crt 4-5. Recent placement of UE Fistula .   10/12/2018 - worsening creat and oliguria worsening despite 40mg  tid alsix  Plan  - renal consult Dr Moshe Cipro called.  Hypoglycemia -resolved H/O DM Plan  -SSI sensitive -Hold home Lantus  -PRN Dextrose   Acute Encephalopathy in setting of uremia vs hypercarbia vs anoxic injury -  -UDS negative  10/12/2018 - RASS -3 despite no sedation gtt x 24h    Plan  - might renal help to clear sedation gtt before eventual extubation  Disposition / Summary of Today's Plan 10/12/18   No extubation 10/12/2018     Diet: TF SUP: Protonix VAP  bundle: Yes VTE ppx: heparin ACS dose Code Status: FC Family Communication: sister from [place] and daughter at [place]    The patient is critically ill with multiple organ systems failure and requires high complexity decision making for assessment and support, frequent evaluation and titration of therapies, application of advanced monitoring technologies and extensive interpretation of multiple databases.   Critical Care Time devoted to patient care services described in this note is  30  Minutes. This time reflects time of care of this signee Dr Brand Males. This critical care time does not reflect procedure time, or teaching time or supervisory time of PA/NP/Med student/Med Resident etc but could involve care discussion time     Dr. Brand Males, M.D., [facility].C.P Pulmonary and Critical Care Medicine Staff Physician [place] [facility] Pager: [phone], If no answer or between  15:00h - 7:00h: call [phone]  10/12/2018 12:16 PM

## 2018-10-12 NOTE — Significant Event (Signed)
PCCM NOTE  57 yr old female w/ PMHx HTN, DM, COPD, PAD, OSA, and advanced CKD s/p Left AVF on 10/17 Admitted on 10/07/18 s/p Cardiac Arrest witnessed by family. Intubated on mechanical ventilation planned for CVVHDF on 10/26 AM initailly.  Called to bedside at 8:33 PM CODE BLUE Length of code 2 mins Meds rec'd: 36m Epi Preceding events: ETT was moved out proximally poor volumes on vent advanced by RT still poor volumes and difficulty bagging   Interventions and Plan: Post ROSC Examined ETT position with Glideoscope- in place Still difficulty bagging patient  RR in 40s volumes on vent 100-200cc Switched pt to APRV P high 20 P low 5 T high 5 T low 1 - O 2 sats went from 79% to 100% CXR shows no pneumothorax , Right hemithorax has increased vascular congestion  Bronchoscopy performed: carina not visualized initially due to very thick brown mucous plug at end of ETT took several passes to remove. Once cleared carina sharp. mucosa friable and erythematous  Examination of left bronchial tree minimal mucus right bronchial tree in RML and RLL thick mucous plugging difficult to suction with bronch. BAL performed in RML  Volumes post bronch 400cc ABG: ph 7.13/80/56/27 Switched pt to PC 20 over PEEP 10 Rate 15 FiO2 100 Sedation with fentanyl to provide synchrony with ventilator Gave 2 pushes bicarb  Started on levophed after repeated cuff pressures systolic <<83 A- line in Right radial by RT Spoke to Nephrology MD over night and CVVHDF orders have been placed and will begin this evening.   Signed Dr KSeward CarolPulmonary Critical Care Locums

## 2018-10-12 NOTE — Consult Note (Signed)
[facility] Renal Consultation Note  Requesting MD: Ramaswamy Indication for Consultation: Advanced CKD  HPI:  Catherine Williams is a 57 y.o. female with past medical history significant for hypertension, diabetes mellitus, COPD, PAD, OSA and advanced CKD followed by Dr. Marval Regal at [facility] post left upper AV fistula done on 10/17-her creatinine seems to run in the high threes to low fours.  Per the family, she did pretty well with the fistula.  She presented to the hospital on 10/20 after a witnessed cardiac arrest.  EMS was able to reestablish circulation after 20-30 minutes.  She underwent therapeutic hypo-thermia and has been in the ICU since.  Kidney function stayed at her baseline with BUN in the 60s to 70s and creatinine in the high threes until 10/24 number started to worsen-94 and 4.32, and now 124 and 4.91 today with potassium 5.0, bicarb 23.  Her urine output initially was great, but then did lower.  Seems to be possibly trying to increase again.  Neurologically patient seems to be in there, she is recognizing family, trying to wake up and interact with them and can follow commands  Creatinine, Ser  Date/Time Value Ref Range Status  10/12/2018 04:17 AM 4.91 (H) 0.44 - 1.00 mg/dL Final  10/11/2018 04:25 AM 4.32 (H) 0.44 - 1.00 mg/dL Final  10/10/2018 09:59 AM 3.86 (H) 0.44 - 1.00 mg/dL Final  10/10/2018 04:22 AM 3.82 (H) 0.44 - 1.00 mg/dL Final  10/09/2018 11:28 PM 3.82 (H) 0.44 - 1.00 mg/dL Final  10/09/2018 04:31 PM 3.80 (H) 0.44 - 1.00 mg/dL Final  10/09/2018 10:08 AM 3.64 (H) 0.44 - 1.00 mg/dL Final  10/09/2018 05:05 AM 3.68 (H) 0.44 - 1.00 mg/dL Final  10/09/2018 02:08 AM 3.69 (H) 0.44 - 1.00 mg/dL Final  10/08/2018 09:09 PM 3.81 (H) 0.44 - 1.00 mg/dL Final  10/08/2018 05:08 PM 3.87 (H) 0.44 - 1.00 mg/dL Final  10/08/2018 01:09 PM 3.92 (H) 0.44 - 1.00 mg/dL Final  10/08/2018 08:56 AM 4.03 (H) 0.44 - 1.00 mg/dL Final  10/08/2018 06:18 AM 4.13 (H) 0.44 - 1.00  mg/dL Final  10/08/2018 05:23 AM 4.20 (H) 0.44 - 1.00 mg/dL Final  10/08/2018 03:09 AM 4.01 (H) 0.44 - 1.00 mg/dL Final  10/08/2018 12:56 AM 4.06 (H) 0.44 - 1.00 mg/dL Final  10/07/2018 11:59 PM 4.02 (H) 0.44 - 1.00 mg/dL Final  10/07/2018 10:56 PM 4.08 (H) 0.44 - 1.00 mg/dL Final  10/07/2018 07:48 PM 4.80 (H) 0.44 - 1.00 mg/dL Final  10/07/2018 07:47 PM 4.16 (H) 0.44 - 1.00 mg/dL Final  09/26/2018 11:41 AM 4.09 (H) 0.44 - 1.00 mg/dL Final  09/06/2018 11:15 AM 3.93 (H) 0.44 - 1.00 mg/dL Final  08/01/2018 11:13 AM 4.62 (H) 0.44 - 1.00 mg/dL Final  07/04/2018 11:14 AM 5.31 (H) 0.44 - 1.00 mg/dL Final  05/24/2018 10:11 AM 6.00 (H) 0.44 - 1.00 mg/dL Final  05/18/2018 04:20 AM 5.88 (H) 0.44 - 1.00 mg/dL Final  05/17/2018 04:43 AM 6.06 (H) 0.44 - 1.00 mg/dL Final  05/16/2018 07:43 AM 6.02 (H) 0.44 - 1.00 mg/dL Final  05/14/2018 03:34 AM 3.38 (H) 0.44 - 1.00 mg/dL Final  05/12/2018 10:07 PM 3.06 (H) 0.44 - 1.00 mg/dL Final  04/26/2018 09:18 AM 3.15 (H) 0.44 - 1.00 mg/dL Final  03/13/2018 10:39 AM 2.59 (H) 0.44 - 1.00 mg/dL Final  02/13/2018 09:50 AM 2.77 (H) 0.44 - 1.00 mg/dL Final  12/28/2017 12:40 PM 3.49 (H) 0.44 - 1.00 mg/dL Final  11/30/2017 01:00 PM 3.09 (H) 0.44 - 1.00 mg/dL  Final  11/02/2017 12:56 PM 2.79 (H) 0.44 - 1.00 mg/dL Final  09/19/2017 01:11 PM 2.88 (H) 0.44 - 1.00 mg/dL Final  08/03/2017 10:09 AM 2.92 (H) 0.44 - 1.00 mg/dL Final  06/30/2017 08:53 AM 3.17 (H) 0.44 - 1.00 mg/dL Final  06/02/2017 08:54 AM 2.97 (H) 0.44 - 1.00 mg/dL Final  05/05/2017 09:48 AM 2.86 (H) 0.44 - 1.00 mg/dL Final  03/28/2017 01:50 PM 2.96 (H) 0.44 - 1.00 mg/dL Final  02/28/2017 12:37 PM 2.34 (H) 0.44 - 1.00 mg/dL Final  01/16/2017 01:03 PM 2.75 (H) 0.44 - 1.00 mg/dL Final  12/21/2016 01:50 PM 4.57 (H) 0.44 - 1.00 mg/dL Final  11/03/2016 04:00 AM 3.59 (H) 0.44 - 1.00 mg/dL Final  11/02/2016 12:24 PM 3.48 (H) 0.44 - 1.00 mg/dL Final  11/01/2016 04:44 AM 3.49 (H) 0.44 - 1.00 mg/dL Final  10/31/2016  10:59 AM 2.90 (H) 0.44 - 1.00 mg/dL Final  10/24/2016 03:48 AM 2.55 (H) 0.44 - 1.00 mg/dL Final  10/23/2016 11:33 AM 2.55 (H) 0.44 - 1.00 mg/dL Final     PMHx:   Past Medical History:  Diagnosis Date  . Anemia   . Arthritis Dec. 2014   Gout  . Asthma   . CHF (congestive heart failure) ([place])   . COPD (chronic obstructive pulmonary disease) ([place])   . Diabetes mellitus   . GERD (gastroesophageal reflux disease)   . Heart failure   . Hyperlipidemia   . Hypertension   . Kidney disease   . Morbid obesity ([place])   . Peripheral vascular disease ([place])   . Pinched nerve   . Requires supplemental oxygen    as needed  . Sleep apnea    wears cpap  . Wears dentures     Past Surgical History:  Procedure Laterality Date  . Atherectomy and angioplasty  10/18/2011   left posterior tibial artery  . AV FISTULA PLACEMENT Left 10/04/2018   Procedure: ARTERIOVENOUS (AV) FISTULA CREATION LEFT ARM;  Surgeon: Serafina Mitchell, MD;  Location: [facility];  Service: Vascular;  Laterality: Left;  . COLONOSCOPY    . HAND SURGERY     right  . MULTIPLE TOOTH EXTRACTIONS    . OVARY SURGERY     Left  . TOE AMPUTATION  Sept. 25,2012   Left 4th and 5th toes    Family Hx:  Family History  Problem Relation Age of Onset  . Lung cancer Mother   . Clotting disorder Mother   . Heart disease Mother   . Cancer Mother        Lung  . Deep vein thrombosis Mother   . Diabetes Mother   . Hypertension Mother   . Varicose Veins Mother   . Cancer Father   . Clotting disorder Sister   . Heart attack Sister   . Diabetes Sister   . Clotting disorder Brother   . Deep vein thrombosis Brother   . Diabetes Brother   . Heart disease Brother   . Heart disease Sister     Social History:  reports that she has never smoked. She has never used smokeless tobacco. She reports that she has current or past drug history. Drug: Marijuana. She reports that she does not drink alcohol.  Allergies:  Allergies  Allergen  Reactions  . Omnipaque [Iohexol] Nausea And Vomiting    Contrast Dye- Effects Kidney's    Medications: Prior to Admission medications   Medication Sig Start Date End Date Taking? Authorizing Provider  amLODipine (NORVASC) 5 MG tablet Take  1 tablet (5 mg total) by mouth daily. 05/19/18  Yes Roxan Hockey, MD  carvedilol (COREG) 6.25 MG tablet Take 1 tablet (6.25 mg total) by mouth 2 (two) times daily with a meal. 05/18/18  Yes Emokpae, Courage, MD  furosemide (LASIX) 80 MG tablet Take 1 tablet (80 mg)  in the morning, and 1/2 tablet (40 mg)  in the evening Patient taking differently: Take 40-80 mg by mouth See admin instructions. Take 1 tablet (80 mg)  in the morning, and 1/2 tablet (40 mg)  in the evening 05/18/18  Yes Emokpae, Courage, MD  insulin aspart (NOVOLOG) 100 UNIT/ML injection Inject 20 Units into the skin 3 (three) times daily with meals.    Yes [provider]  insulin glargine (LANTUS) 100 UNIT/ML injection Inject 60 Units into the skin at bedtime.    Yes [provider]  isosorbide-hydrALAZINE (BIDIL) 20-37.5 MG tablet Take 2 tablets by mouth 3 (three) times daily. Patient taking differently: Take 1 tablet by mouth 3 (three) times daily.  05/18/18  Yes Emokpae, Courage, MD  rosuvastatin (CRESTOR) 40 MG tablet Take 40 mg by mouth every evening.    Yes [provider]  albuterol (PROVENTIL HFA;VENTOLIN HFA) 108 (90 BASE) MCG/ACT inhaler Inhale 1-2 puffs into the lungs every 6 (six) hours as needed for wheezing. 07/25/13   Harden Mo, MD  allopurinol (ZYLOPRIM) 100 MG tablet Take 1 tablet (100 mg total) by mouth at bedtime. 07/16/18   Jessy Oto, MD  aspirin 81 MG tablet Take 81 mg by mouth daily.      [provider]  Darbepoetin Alfa (ARANESP) 60 MCG/0.3ML SOSY injection Inject 0.3 mLs (60 mcg total) into the skin every Thursday at 6pm. Patient taking differently: Inject 60 mcg into the skin every 30 (thirty) days.  11/03/16   Doreatha Lew, MD  ferrous sulfate 325 (65 FE) MG tablet Take 1 tablet (325 mg total) by mouth 2 (two) times daily with a meal. 11/03/16   Patrecia Pour, Christean Grief, MD  HYDROcodone-acetaminophen (NORCO/VICODIN) 5-325 MG tablet Take 1 tablet by mouth every 6 (six) hours as needed. Patient taking differently: Take 1 tablet by mouth every 6 (six) hours as needed for moderate pain.  07/03/18   Loura Halt A, NP  loratadine (CLARITIN) 10 MG tablet Take 10 mg by mouth daily as needed for allergies.     [provider]  oxyCODONE (ROXICODONE) 5 MG immediate release tablet Take 1 tablet (5 mg total) by mouth every 8 (eight) hours as needed. 10/04/18 10/04/19  Serafina Mitchell, MD  pantoprazole (PROTONIX) 40 MG tablet Take 1 tablet (40 mg total) by mouth daily. 07/21/15   Barton Dubois, MD    I have reviewed the patient's current medications.  Labs:  Results for orders placed or performed during the hospital encounter of 10/07/18 (from the past 48 hour(s))  Glucose, capillary     Status: Abnormal   Collection Time: 10/10/18  4:03 PM  Result Value Ref Range   Glucose-Capillary 136 (H) 70 - 99 mg/dL   Comment 1 Capillary Specimen   Magnesium     Status: None   Collection Time: 10/10/18  4:09 PM  Result Value Ref Range   Magnesium 2.0 1.7 - 2.4 mg/dL    Comment: Performed at [facility], [place] [address] [place], [place] [address]  Phosphorus     Status: Abnormal   Collection Time: 10/10/18  4:09 PM  Result Value Ref Range  Phosphorus 4.7 (H) 2.5 - 4.6 mg/dL    Comment: Performed at [facility], [place] [address], [place], [place] [address]  Glucose, capillary     Status: Abnormal   Collection Time: 10/10/18  8:50 PM  Result Value Ref Range   Glucose-Capillary 176 (H) 70 - 99 mg/dL   Comment 1 Arterial Specimen   Glucose, capillary     Status: Abnormal   Collection Time: 10/11/18 12:09 AM  Result Value Ref Range   Glucose-Capillary 134 (H) 70 - 99 mg/dL   Comment 1 Arterial Specimen    CBC     Status: Abnormal   Collection Time: 10/11/18  4:25 AM  Result Value Ref Range   WBC 18.9 (H) 4.0 - 10.5 K/uL   RBC 3.86 (L) 3.87 - 5.11 MIL/uL   Hemoglobin 9.7 (L) 12.0 - 15.0 g/dL   HCT 34.8 (L) 36.0 - 46.0 %   MCV 90.2 80.0 - 100.0 fL   MCH 25.1 (L) 26.0 - 34.0 pg   MCHC 27.9 (L) 30.0 - 36.0 g/dL   RDW 15.7 (H) 11.5 - 15.5 %   Platelets 201 150 - 400 K/uL   nRBC 0.0 0.0 - 0.2 %    Comment: Performed at [facility], [place] [address], [place], [place] [address]  Basic metabolic panel     Status: Abnormal   Collection Time: 10/11/18  4:25 AM  Result Value Ref Range   Sodium 145 135 - 145 mmol/L   Potassium 4.6 3.5 - 5.1 mmol/L   Chloride 110 98 - 111 mmol/L   CO2 24 22 - 32 mmol/L   Glucose, Bld 201 (H) 70 - 99 mg/dL   BUN 94 (H) 6 - 20 mg/dL   Creatinine, Ser 4.32 (H) 0.44 - 1.00 mg/dL   Calcium 10.0 8.9 - 10.3 mg/dL   GFR calc non Af Amer 10 (L) >60 mL/min   GFR calc Af Amer 12 (L) >60 mL/min    Comment: (NOTE) The eGFR has been calculated using the CKD EPI equation. This calculation has not been validated in all clinical situations. eGFR's persistently <60 mL/min signify possible Chronic Kidney Disease.    Anion gap 11 5 - 15    Comment: Performed at [facility] [address] [place], [place] [address]  Glucose, capillary     Status: Abnormal   Collection Time: 10/11/18  4:38 AM  Result Value Ref Range   Glucose-Capillary 175 (H) 70 - 99 mg/dL   Comment 1 Notify RN   Glucose, capillary     Status: Abnormal   Collection Time: 10/11/18 10:44 AM  Result Value Ref Range   Glucose-Capillary 167 (H) 70 - 99 mg/dL   Comment 1 Capillary Specimen    Comment 2 Arterial Specimen   Glucose, capillary     Status: Abnormal   Collection Time: 10/11/18 12:28 PM  Result Value Ref Range   Glucose-Capillary 170 (H) 70 - 99 mg/dL   Comment 1 Notify RN   Glucose, capillary     Status: Abnormal   Collection Time: 10/11/18  4:03 PM  Result Value Ref Range    Glucose-Capillary 160 (H) 70 - 99 mg/dL   Comment 1 Notify RN   Glucose, capillary     Status: Abnormal   Collection Time: 10/11/18  8:04 PM  Result Value Ref Range   Glucose-Capillary 149 (H) 70 - 99 mg/dL  Glucose, capillary     Status: Abnormal   Collection Time:  10/11/18 11:50 PM  Result Value Ref Range   Glucose-Capillary 172 (H) 70 - 99 mg/dL   Comment 1 Notify RN   Glucose, capillary     Status: Abnormal   Collection Time: 10/12/18  3:51 AM  Result Value Ref Range   Glucose-Capillary 140 (H) 70 - 99 mg/dL  CBC     Status: Abnormal   Collection Time: 10/12/18  4:17 AM  Result Value Ref Range   WBC 20.5 (H) 4.0 - 10.5 K/uL   RBC 3.75 (L) 3.87 - 5.11 MIL/uL   Hemoglobin 9.2 (L) 12.0 - 15.0 g/dL   HCT 34.1 (L) 36.0 - 46.0 %   MCV 90.9 80.0 - 100.0 fL   MCH 24.5 (L) 26.0 - 34.0 pg   MCHC 27.0 (L) 30.0 - 36.0 g/dL   RDW 15.8 (H) 11.5 - 15.5 %   Platelets 218 150 - 400 K/uL   nRBC 0.0 0.0 - 0.2 %    Comment: Performed at [facility], [place] [address] [place], [place] [address]  Basic metabolic panel     Status: Abnormal   Collection Time: 10/12/18  4:17 AM  Result Value Ref Range   Sodium 143 135 - 145 mmol/L   Potassium 5.0 3.5 - 5.1 mmol/L   Chloride 108 98 - 111 mmol/L   CO2 23 22 - 32 mmol/L   Glucose, Bld 164 (H) 70 - 99 mg/dL   BUN 124 (H) 6 - 20 mg/dL   Creatinine, Ser 4.91 (H) 0.44 - 1.00 mg/dL   Calcium 10.2 8.9 - 10.3 mg/dL   GFR calc non Af Amer 9 (L) >60 mL/min   GFR calc Af Amer 10 (L) >60 mL/min    Comment: (NOTE) The eGFR has been calculated using the CKD EPI equation. This calculation has not been validated in all clinical situations. eGFR's persistently <60 mL/min signify possible Chronic Kidney Disease.    Anion gap 12 5 - 15    Comment: Performed at [facility] [address], [place], [place] [address]  Magnesium     Status: None   Collection Time: 10/12/18  4:17 AM  Result Value Ref Range   Magnesium 2.1 1.7 - 2.4 mg/dL     Comment: Performed at [facility] [address], [place], [place] [address]  Phosphorus     Status: Abnormal   Collection Time: 10/12/18  4:17 AM  Result Value Ref Range   Phosphorus 6.3 (H) 2.5 - 4.6 mg/dL    Comment: Performed at [facility] [address], [place], [place] [address]  Glucose, capillary     Status: Abnormal   Collection Time: 10/12/18  8:01 AM  Result Value Ref Range   Glucose-Capillary 167 (H) 70 - 99 mg/dL   Comment 1 Notify RN   Glucose, capillary     Status: Abnormal   Collection Time: 10/12/18 11:33 AM  Result Value Ref Range   Glucose-Capillary 151 (H) 70 - 99 mg/dL   Comment 1 Document in Chart      ROS:  Review of systems not obtained due to patient factors.  Physical Exam: Vitals:   10/12/18 1300 10/12/18 1400  BP: (!) 97/46 (!) 104/55  Pulse: 86 85  Resp: (!) 24 16  Temp: 99.3 F (37.4 C) 99.5 F (37.5 C)  SpO2: 100% 100%     General: Obese black female, intubated  HEENT: Pupils are equal round reactive to light, extra ocular motions are intact,  mucous membranes moist Neck: Difficult to determine JVD Heart: Regular rate and rhythm Lungs: Decreased breath sounds at the bases Abdomen: Obese, soft, nontender Extremities: Pitting edema-left upper arm AV fistula with good thrill Skin: Warm and dry Neuro: Intubated-according to nursing and family patient is intermittently more awake and can follow commands  Assessment/Plan: 57 year old black female with numerous medical problems including advanced CKD-status post AV fistula.  She is now status post cardiac arrest and has had acute on chronic renal failure 1.Renal- acute on chronic renal failure after cardiac arrest.  Initially, she is making good urine and kidney function was stable.  Now, after blood pressure is controlled possibly has had some relative hypotension giving some ATN with worsening numbers.  A BUN over 120 I am sure is hampering her recovery and ability to extubate.   I have discussed this with the family.  Because her urine output may be is trying to increase ? I would like to give it overnight to see where it trends.  If urine output stays low and kidney function is worsening I think it best to institute renal replacement therapy.  Given that she just had a cardiac arrest and her blood pressure is soft and needs volume removal I think CRRT would be the way to start to go but hope that we could transition her to intermittent hemo fairly quickly 2. Hypertension/volume  -is volume overloaded but now blood pressure is soft.  She is only on a very low dose of Coreg.  Has been given Lasix with minimal response.  I will not reorder.  If her ATN recovers then urine output will augment naturally 3.  Hyperkalemia-potassium is climbing.  We will watch 4. Anemia  -not that significant-supportive care for now 5.  Status post arrest-I guess thinking due to OSA-not sure if more significant cardiac work-up is required   Louis Meckel 10/12/2018, 2:41 PM

## 2018-10-12 NOTE — Procedures (Signed)
Hemodialysis Catheter Insertion Procedure Note Catherine Williams [id number] 1961-05-15  Procedure: Insertion of Hemodialysis Catheter Indications: CRRT  Procedure Details Consent: Risks of procedure as well as the alternatives and risks of each were explained to the (patient/caregiver).  Consent for procedure obtained. Time Out: Verified patient identification, verified procedure, site/side was marked, verified correct patient position, special equipment/implants available, medications/allergies/relevent history reviewed, required imaging and test results available.  Performed  Maximum sterile technique was used including antiseptics, cap, gloves, gown, hand hygiene, mask and sheet. Skin prep: Chlorhexidine; local anesthetic administered A antimicrobial bonded/coated triple lumen catheter was placed in the right internal jugular vein using the Seldinger technique.  Evaluation Blood flow good Complications: No apparent complications Patient did tolerate procedure well. Chest X-ray ordered to verify placement.  CXR: pending.  Procedure performed under direct ultrasound guidance for real time vessel cannulation.      [name], [facility] Pgr: [phone]  or [phone] 10/12/2018, 4:57 PM

## 2018-10-12 NOTE — Procedures (Signed)
Bronchoscopy Procedure Note Catherine Williams [id number] 08-28-1961  Procedure: Bronchoscopy Indications: Diagnostic evaluation of the airways, Obtain specimens for culture and/or other diagnostic studies and Remove secretions  Procedure Details Consent: Unable to obtain consent because of emergent medical necessity. Time Out: Verified patient identification, verified procedure, site/side was marked, verified correct patient position, special equipment/implants available, medications/allergies/relevent history reviewed, required imaging and test results available.  Performed  In preparation for procedure, patient was given 100% FiO2 and bronchoscope lubricated. Sedation: fentanyl prn  Airway entered and the following bronchi were examined: RUL, RML, RLL, LUL and LLL.   FINDINGS: carina not visualized initially due to very thick brown mucous plug at end of ETT took several passes to remove.  Once cleared carina sharp. mucosa friable and erythematous  Examination of left bronchial tree minimal mucus right bronchial tree in RML and RLL thick mucous plugging difficult to suction with bronch. BAL performed in RML Procedures performed: Brushings performed Bronchoscope removed.  , Patient placed back on 100% FiO2 at conclusion of procedure.    Evaluation Hemodynamic Status: Transient hypotension treated with pressors; O2 sats: stable throughout Patient's Current Condition: stable Specimens:  Sent purulent fluid bloody Complications: No apparent complications Patient did tolerate procedure well.   Catherine Williams Catherine Williams 10/12/2018

## 2018-10-12 NOTE — Progress Notes (Signed)
Patient received sedation and pain meds for bedside HD catheter insertion. Since procedure, patient's BP have been low. SBP 80-100, DBP 30-40, and MAP 45-65. [name], PA notified and aware. He stated to continue to monitor and if low BP's continue for a few more hours, to call elink. Will relay to oncoming RN.  Joellen Jersey, RN

## 2018-10-13 ENCOUNTER — Inpatient Hospital Stay (HOSPITAL_COMMUNITY): Payer: Medicare Other

## 2018-10-13 LAB — POCT ACTIVATED CLOTTING TIME
ACTIVATED CLOTTING TIME: 136 s
ACTIVATED CLOTTING TIME: 147 s
ACTIVATED CLOTTING TIME: 147 s
ACTIVATED CLOTTING TIME: 169 s
ACTIVATED CLOTTING TIME: 169 s
ACTIVATED CLOTTING TIME: 175 s
ACTIVATED CLOTTING TIME: 180 s
ACTIVATED CLOTTING TIME: 180 s
ACTIVATED CLOTTING TIME: 202 s
Activated Clotting Time: 164 seconds
Activated Clotting Time: 169 seconds
Activated Clotting Time: 164 seconds
Activated Clotting Time: 169 seconds
Activated Clotting Time: 180 seconds
Activated Clotting Time: 186 seconds
Activated Clotting Time: 186 seconds
Activated Clotting Time: 186 seconds
Activated Clotting Time: 208 seconds
Activated Clotting Time: 202 seconds

## 2018-10-13 LAB — RENAL FUNCTION PANEL
ALBUMIN: 2.2 g/dL — AB (ref 3.5–5.0)
ANION GAP: 12 (ref 5–15)
Albumin: 2.1 g/dL — ABNORMAL LOW (ref 3.5–5.0)
Anion gap: 5 (ref 5–15)
BUN: 121 mg/dL — ABNORMAL HIGH (ref 6–20)
BUN: 90 mg/dL — ABNORMAL HIGH (ref 6–20)
CALCIUM: 10 mg/dL (ref 8.9–10.3)
CALCIUM: 9.8 mg/dL (ref 8.9–10.3)
CO2: 26 mmol/L (ref 22–32)
CO2: 28 mmol/L (ref 22–32)
CREATININE: 4.91 mg/dL — AB (ref 0.44–1.00)
Chloride: 106 mmol/L (ref 98–111)
Chloride: 108 mmol/L (ref 98–111)
Creatinine, Ser: 3.88 mg/dL — ABNORMAL HIGH (ref 0.44–1.00)
GFR, EST AFRICAN AMERICAN: 10 mL/min — AB (ref >60)
GFR, EST AFRICAN AMERICAN: 14 mL/min — AB (ref >60)
GFR, EST NON AFRICAN AMERICAN: 9 mL/min — AB (ref >60)
GFR, EST NON AFRICAN AMERICAN: 12 mL/min — AB (ref >60)
Glucose, Bld: 182 mg/dL — ABNORMAL HIGH (ref 70–99)
Glucose, Bld: 141 mg/dL — ABNORMAL HIGH (ref 70–99)
PHOSPHORUS: 4.9 mg/dL — AB (ref 2.5–4.6)
PHOSPHORUS: 4.2 mg/dL (ref 2.5–4.6)
Potassium: 4.6 mmol/L (ref 3.5–5.1)
Potassium: 4.6 mmol/L (ref 3.5–5.1)
SODIUM: 144 mmol/L (ref 135–145)
SODIUM: 141 mmol/L (ref 135–145)

## 2018-10-13 LAB — GLUCOSE, CAPILLARY
GLUCOSE-CAPILLARY: 146 mg/dL — AB (ref 70–99)
GLUCOSE-CAPILLARY: 118 mg/dL — AB (ref 70–99)
Glucose-Capillary: 153 mg/dL — ABNORMAL HIGH (ref 70–99)
Glucose-Capillary: 142 mg/dL — ABNORMAL HIGH (ref 70–99)
Glucose-Capillary: 131 mg/dL — ABNORMAL HIGH (ref 70–99)

## 2018-10-13 LAB — CBC
HCT: 31.1 % — ABNORMAL LOW (ref 36.0–46.0)
Hemoglobin: 8.6 g/dL — ABNORMAL LOW (ref 12.0–15.0)
MCH: 24.7 pg — AB (ref 26.0–34.0)
MCHC: 27.7 g/dL — AB (ref 30.0–36.0)
MCV: 89.4 fL (ref 80.0–100.0)
PLATELETS: 221 10*3/uL (ref 150–400)
RBC: 3.48 MIL/uL — AB (ref 3.87–5.11)
RDW: 15.8 % — AB (ref 11.5–15.5)
WBC: 16 10*3/uL — ABNORMAL HIGH (ref 4.0–10.5)
nRBC: 0 % (ref 0.0–0.2)

## 2018-10-13 LAB — BLOOD GAS, ARTERIAL
Acid-Base Excess: 0.5 mmol/L (ref 0.0–2.0)
Bicarbonate: 26.3 mmol/L (ref 20.0–28.0)
DRAWN BY: 252031
FIO2: 40
LHR: 15 {breaths}/min
O2 SAT: 97.4 %
PEEP: 10 cmH2O
Patient temperature: 96.1
Pressure control: 20 cmH2O
pCO2 arterial: 51.7 mmHg — ABNORMAL HIGH (ref 32.0–48.0)
pH, Arterial: 7.317 — ABNORMAL LOW (ref 7.350–7.450)
pO2, Arterial: 93.6 mmHg (ref 83.0–108.0)

## 2018-10-13 LAB — CULTURE, BLOOD (ROUTINE X 2)
CULTURE: NO GROWTH
Special Requests: ADEQUATE

## 2018-10-13 LAB — MAGNESIUM: MAGNESIUM: 2.3 mg/dL (ref 1.7–2.4)

## 2018-10-13 LAB — APTT: APTT: 51 s — AB (ref 24–36)

## 2018-10-13 MED ORDER — ROSUVASTATIN CALCIUM 10 MG PO TABS
10.0000 mg | ORAL_TABLET | Freq: Every evening | ORAL | Status: DC
  Administered 2018-10-13 – 2018-10-31 (×18): 10 mg
  Filled 2018-10-13 (×19): qty 1

## 2018-10-13 MED ORDER — DARBEPOETIN ALFA 100 MCG/0.5ML IJ SOSY
100.0000 ug | PREFILLED_SYRINGE | INTRAMUSCULAR | Status: DC
  Administered 2018-10-13: 100 ug via INTRAVENOUS
  Filled 2018-10-13 (×2): qty 0.5

## 2018-10-13 MED FILL — Medication: Qty: 1 | Status: AC

## 2018-10-13 NOTE — Progress Notes (Signed)
NAME:  Catherine Williams, MRN:  [id number], DOB:  Sep 16, 1961, LOS: 6 ADMISSION DATE:  10/07/2018, CONSULTATION DATE:  10/07/2018 REFERRING MD:  Dr. Benjamine Mola , CHIEF COMPLAINT:  Cardiac Arrest    Brief History   57 year old female presents to ED on 10/20 s/p Witnessed Arrest. Family reports that patient was of normal state of health, this afternoon she was sitting on couch with daughter when she was noted to be breathing "funny", when daughter tired to stimulate patient she was unresponsive at 1830. EMS arrived 1840 and patient was noted to be pulseless. ROSC achieved at 1900 after 2 shocks and CPR. On arrival to ED patient remained unresponsive. LA 1.38. K 4.2. WBC 10.3. BP 174/80. Cardiology consulted. PCCM asked to admit.   Past Medical History  Diastolic HF (EF 92-42, A8TM), COPD, PVD, Chronic Kidney Disease (Placement of Fistula on 10/17)  Significant Hospital Events   10/20 > Presents to ED s/p Cardiac Arrest  10/22 rewarmed at 1pm 10/24 - not awake enough to extubate but did follow commands  10/25 - sister from [place] at bedside. Daugther at bedside. Both very concerned she is not fully arousable. Per RN/family - can arouse to deep voice and track and nod and follow simple commands very briefly. Then falls asleep. Only 100cc./h urine so far. Per family - s/p LUE fistula recently in anticipation of future HD needs. Baseline creat 3.8s. Creat up at 4.69m%. Office renal doc -> Dr CMarval Regal    Consults: date of consult/date signed off & final recs:  Cardiology 10/20 PCCM 10/20  Procedures (surgical and bedside):  ETT 10/20 >> Left IJ 10/20 >>   Significant Diagnostic Tests:  CXR 10/20 > Heart size is mildly enlarged. Mild vascular congestion. No confluent opacities, effusions or pneumothorax. Suspect left lateral rib fractures, but difficult to visualize. CT Head 10/20 >>neg ECHO 10/21 >> nml LVEF, gr 2 DD EEG 10/21 >> slowing. No epileptiform activity.  Venous doppler legs 10/21  > no evidence DVT HD cath 10/25  Micro Data:  Blood 10/20 >> Sputum 10/20 >> U/A 10/20 >>   Antimicrobials:  N/A     SUBJECTIVE/OVERNIGHT/INTERVAL HX  10/26 - 2 min PEA arrest with x  1 epi last night. Related to mucus plugging seen in ET tube via bronchoscopy. Sp BAL in RML Started on CRRT overnight -> This aM on CRRT, 40% fio2, PRVC and fentanyl and levophed gtt -> RASS -1 and followed simple commands. Seems weak  Objective   Blood pressure (!) 95/40, pulse 82, temperature 97.9 F (36.6 C), resp. rate 12, height _0  (1.575 m), weight (!) 140.8 kg, SpO2 100 %. CVP:  [5 mmHg-11 mmHg] 6 mmHg  Vent Mode: PCV FiO2 (%):  [40 %-100 %] 40 % Set Rate:  [15 bmp-24 bmp] 15 bmp Vt Set:  [400 mL] 400 mL PEEP:  [5 cmH20-10 cmH20] 10 cmH20 Plateau Pressure:  [22 cmH20-28 cmH20] 26 cmH20   Intake/Output Summary (Last 24 hours) at 10/13/2018 0936 Last data filed at 10/13/2018 0900 Gross per 24 hour  Intake 544.69 ml  Output 1598 ml  Net -1053.31 ml   Filed Weights   10/10/18 0100 10/12/18 0429 10/13/18 0500  Weight: (!) 146.3 kg (!) 141.5 kg (!) 140.8 kg    Examination:  General Appearance:  Looks criticall ill OBESE - + Head:  Normocephalic, without obvious abnormality, atraumatic Eyes:  PERRL - yes, conjunctiva/corneas - clea5     Ears:  Normal external ear canals, both  ears Nose:  G tube - no Throat:  ETT TUBE - yes , OG tube - yes Neck:  Supple,  No enlargement/tenderness/nodules Lungs: Clear to auscultation bilaterally, Ventilator   Synchrony - yes Heart:  S1 and S2 normal, no murmur, CVP - no.  Pressors - yes Abdomen:  Soft, no masses, no organomegaly Genitalia / Rectal:  Not done Extremities:  Extremities- intact, edema + Skin:  ntact in exposed areas . Sacral area - not examined Neurologic:  Sedation - Fent gtt -> RASS - -1 . Moves all 4s - yes but diffusely weak 2/5 power. CAM-ICU - unable to test . Orientation - followed simple commands      LABS     PULMONARY Recent Labs  Lab 10/07/18 2342 10/08/18 0049 10/08/18 0523 10/09/18 0512 10/12/18 2012 10/12/18 2259 10/13/18 0319  PHART 7.299*  --   --  7.440 7.134* 7.301* 7.317*  PCO2ART 51.0*  --   --  34.3 80.5* 60.1* 51.7*  PO2ART 81.0*  --   --  84.0 56.0* 351.0* 93.6  HCO3 25.5  --   --  24.0 27.1 29.6* 26.3  TCO2 27  --  _0 --   O2SAT 95.0 89.4  --  98.0 77.0 100.0 97.4    CBC Recent Labs  Lab 10/12/18 0417 10/12/18 2013 10/13/18 0409  HGB 9.2* 8.9* 8.6*  HCT 34.1* 32.0* 31.1*  WBC 20.5* 18.6* 16.0*  PLT 218 233 221    COAGULATION Recent Labs  Lab 10/07/18 1947 10/07/18 2256 10/08/18 0618  INR 1.08 1.13 1.13    CARDIAC   Recent Labs  Lab 10/07/18 2256 10/08/18 0309 10/08/18 0856 10/08/18 1708  TROPONINI 0.08* 0.10* 0.08* 0.06*   No results for input(s): PROBNP in the last 168 hours.   CHEMISTRY Recent Labs  Lab 10/10/18 0422 10/10/18 0959 10/10/18 1609 10/11/18 0425 10/12/18 0417 10/12/18 2013 10/13/18 0409  NA 145 143  --  145 143 143 144  K 4.0 4.0  --  4.6 5.0 4.9 4.6  CL 110 111  --  110 108 107 106  CO2 24 25  --  _1 GLUCOSE 185* 183*  --  201* 164* 240* 182*  BUN 73* 77*  --  94* 124* 142* 121*  CREATININE 3.82* 3.86*  --  4.32* 4.91* 5.71* 4.91*  CALCIUM 9.4 9.4  --  10.0 10.2 10.1 10.0  MG 1.9  --  2.0  --  2.1 2.2 2.3  PHOS 4.8*  --  4.7*  --  6.3* 6.2* 4.9*   Estimated Creatinine Clearance: 16.8 mL/min (A) (by C-G formula based on SCr of 4.91 mg/dL (H)).   LIVER Recent Labs  Lab 10/07/18 1947 10/07/18 2256 10/08/18 0618 10/12/18 2013 10/13/18 0409  AST 52*  --   --  75*  --   ALT 25  --   --  42  --   ALKPHOS 79  --   --  94  --   BILITOT 0.5  --   --  0.6  --   PROT 7.4  --   --  6.8  --   ALBUMIN 3.2*  --   --  2.3* 2.1*  INR 1.08 1.13 1.13  --   --      INFECTIOUS Recent Labs  Lab 10/07/18 1948 10/07/18 2256 10/08/18 0618 10/09/18 0505  LATICACIDVEN 1.38  --   --   --    PROCALCITON  --  <0.10  0.16 0.25     ENDOCRINE CBG (last 3)  Recent Labs    10/12/18 2340 10/13/18 0412 10/13/18 0751  GLUCAP 165* 153* 142*         IMAGING x48h  - image(s) personally visualized  -   highlighted in bold Dg Chest Port 1 View  Result Date: 10/13/2018 CLINICAL DATA:  57 year old female with respiratory failure status post cardiac arrest. EXAM: PORTABLE CHEST 1 VIEW COMPARISON:  10/12/2018 and earlier. FINDINGS: Portable AP semi upright view at 0504 hours. Stable endotracheal tube tip just below the clavicles. Enteric tube courses to the left abdomen, tip not included. Bilateral IJ approach central lines remain in place. Pacer or resuscitation pads project over the left chest. Mildly lower lung volumes and more kyphotic view. Stable cardiac size and mediastinal contours. Coarse and indistinct bilateral pulmonary interstitial opacity persists. No pneumothorax or definite pleural effusion. Paucity of bowel gas in the upper abdomen. IMPRESSION: 1.  Stable lines and tubes. 2. Persistent coarse bilateral pulmonary interstitial opacity. Top differential considerations include interstitial edema, atypical respiratory infection, ARDS. No pleural effusion is evident. Electronically Signed   By: Genevie Ann M.D.   On: 10/13/2018 07:45   Dg Chest Port 1 View  Result Date: 10/12/2018 CLINICAL DATA:  Hypoxia EXAM: PORTABLE CHEST 1 VIEW COMPARISON:  October 12, 2018 FINDINGS: Endotracheal tube tip is 5.5 cm above the carina. Nasogastric tube tip and side port are below the diaphragm. Right central catheter tip is in the superior vena cava. Left central catheter tip is in left innominate vein. No pneumothorax. There is no edema or consolidation. Heart is mildly enlarged with pulmonary vascularity normal. No adenopathy. No evident bone lesions. IMPRESSION: Tube and catheter positions as described without pneumothorax. No edema or consolidation. Mild cardiac enlargement. Electronically Signed    By: Lowella Grip III M.D.   On: 10/12/2018 20:07   Dg Chest Port 1 View  Result Date: 10/12/2018 CLINICAL DATA:  Central line placement. EXAM: PORTABLE CHEST 1 VIEW COMPARISON:  10/12/2018. FINDINGS: Interim placement of right IJ line. Its tip is over the superior vena cava. Left IJ line in stable position. Endotracheal tube and NG tube in stable position. Cardiomegaly. Pulmonary vascular prominence. Mild right base infiltrate. No pleural effusion or pneumothorax. IMPRESSION: 1. Interim placement right IJ line, its tip is over the superior vena cava. Endotracheal tube, NG tube, left IJ line stable position. 2.  Cardiomegaly with pulmonary venous congestion. 3.  Right base infiltrate. Electronically Signed   By: Marcello Moores  Register   On: 10/12/2018 17:04   Dg Chest Port 1 View  Result Date: 10/12/2018 CLINICAL DATA:  Intubation.  Respiratory failure. EXAM: PORTABLE CHEST 1 VIEW COMPARISON:  10/09/2018. FINDINGS: Endotracheal tube has been withdrawn and its tip is now 2.7 cm above the carina. NG tube noted with tip below left hemidiaphragm. Left IJ line noted with tip over the proximal SVC. Cardiomegaly with bilateral pulmonary venous congestion and infiltrates/edema. No pleural effusion or pneumothorax. IMPRESSION: 1. Endotracheal tube has been withdrawn, its tip is now 2.7 cm above the carina. NG tube and left IJ line stable position. 2. Cardiomegaly with bilateral pulmonary infiltrates/edema. Small left pleural effusion. Similar findings noted on prior exam. Electronically Signed   By: Marcello Moores  Register   On: 10/12/2018 10:51     Recent Results (from the past 240 hour(s))  Culture, blood (routine x 2)     Status: None   Collection Time: 10/07/18  9:55 PM  Result Value Ref Range Status  Specimen Description BLOOD RIGHT HAND  Final   Special Requests   Final    BOTTLES DRAWN AEROBIC AND ANAEROBIC Blood Culture adequate volume   Culture   Final    NO GROWTH 5 DAYS Performed at [facility], [address] [place], [place] [address]    Report Status 10/12/2018 FINAL  Final  Culture, blood (routine x 2)     Status: None (Preliminary result)   Collection Time: 10/08/18 12:11 AM  Result Value Ref Range Status   Specimen Description BLOOD CENTRAL LINE  Final   Special Requests   Final    BOTTLES DRAWN AEROBIC AND ANAEROBIC Blood Culture adequate volume   Culture   Final    NO GROWTH 4 DAYS Performed at [facility] [address] [place], [place] [address]    Report Status PENDING  Incomplete  MRSA PCR Screening     Status: None   Collection Time: 10/08/18  3:26 AM  Result Value Ref Range Status   MRSA by PCR NEGATIVE NEGATIVE Final    Comment:        The GeneXpert MRSA Assay (FDA approved for NASAL specimens only), is one component of a comprehensive MRSA colonization surveillance program. It is not intended to diagnose MRSA infection nor to guide or monitor treatment for MRSA infections. Performed at [facility], [place] [address], [place], [place] [address]   Culture, respiratory (non-expectorated)     Status: None   Collection Time: 10/08/18  8:59 AM  Result Value Ref Range Status   Specimen Description TRACHEAL ASPIRATE  Final   Special Requests NONE  Final   Gram Stain   Final    RARE WBC PRESENT, PREDOMINANTLY PMN RARE GRAM POSITIVE COCCI RARE GRAM POSITIVE RODS    Culture   Final    Consistent with normal respiratory flora. Performed at [facility], [place] [address], [place], [place] [address]    Report Status 10/10/2018 FINAL  Final  Expectorated sputum assessment w rflx to resp cult     Status: None   Collection Time: 10/12/18  8:38 PM  Result Value Ref Range Status   Specimen Description EXPECTORATED SPUTUM  Final   Special Requests   Final    NONE Performed at [facility], [place] [address], [place],  [address]    Sputum evaluation THIS SPECIMEN IS ACCEPTABLE FOR SPUTUM CULTURE  Final   Report Status  10/12/2018 FINAL  Final  Culture, respiratory     Status: None (Preliminary result)   Collection Time: 10/12/18  8:38 PM  Result Value Ref Range Status   Specimen Description EXPECTORATED SPUTUM  Final   Special Requests NONE Reflexed from [id number]  Final   Gram Stain   Final    FEW WBC PRESENT, PREDOMINANTLY PMN RARE GRAM POSITIVE COCCI    Culture PENDING  Incomplete   Report Status PENDING  Incomplete      Assessment & Plan:   Acute Hypercarbic Respiratory Failure suspected secondary to decompensated Heart Failure   H/O COPD, OSA on CPAP at HS      10/13/2018 - >10/13/2018 - > does not meet criteria for SBT/Extubation in setting of Acute Respiratory Failure due to pressor needs, CRRT, wakefulness (though improving)  Plan  - VAP bundle - PRVC  V.Fib/PEA Cardiac Arrest - elevated troponin as expected. No evidence of DVT.  Decompensated Heart Failure  H/O Diastolic HF (EF 85-27, P8EU) S/p repeat arrest 10/12/18 due  to mucus plug -EKG with LVH and QTC 480   10/13/2018 - on levophed post CPR 09/1224/19  Plan  - Cardiac Monitoring  - Continue ASA, Crestor - hold coreg - levophed for MAP > 65  Acute on Chronic Stage IV Kidney Disease  Baseline Crt high 3s. . Recent placement of UE Fistula .   10/13/2018 - on CRRT since 10/12/18 due to post arrest ATN. Creat 4.9 and volume overloaded as of 10/12/18 Plan  -CRRT per renal  Hypoglycemia -resolved H/O DM Plan  -SSI sensitive -Hold home Lantus  -PRN Dextrose   Acute Encephalopathy  -UDS negative  10/13/2018 - improving after starting CRRT. Following simple comands. Diffusely weak from obesity and volume overload  Plan  - monitor  Disposition / Summary of Today's Plan 10/13/18   No extubation 10/13/2018 . Continue CRRT     Diet: TF SUP: Protonix VAP bundle: Yes VTE ppx: heparin ACS dose Code Status: FC Family Communication: no family at bedside 10/13/2018    ATTESTATION & SIGNATURE    The patient is  critically ill with multiple organ systems failure and requires high complexity decision making for assessment and support, frequent evaluation and titration of therapies, application of advanced monitoring technologies and extensive interpretation of multiple databases.   Critical Care Time devoted to patient care services described in this note is  30  Minutes. This time reflects time of care of this signee Dr Brand Males. This critical care time does not reflect procedure time, or teaching time or supervisory time of PA/NP/Med student/Med Resident etc but could involve care discussion time     Dr. Brand Males, M.D., [facility].C.P Pulmonary and Critical Care Medicine Staff Physician [facility] Pager: [phone], If no answer or between  15:00h - 7:00h: call [phone]  10/13/2018 9:36 AM

## 2018-10-13 NOTE — Progress Notes (Signed)
[facility] Progress Note Patient Name: Catherine Williams DOB: 01-Mar-1961 MRN: [id number]   Date of Service  10/13/2018  HPI/Events of Note  Bedside nurse notes swelling of tongue, neck and chest. Patient is intubated and ventilated. Concern for [place] emphysema and +/- pneumothorax.   eICU Interventions  Will order: 1. Portable CXR STAT.     Intervention Category Major Interventions: Other:  Lysle Dingwall 10/13/2018, 8:57 PM

## 2018-10-13 NOTE — Significant Event (Signed)
At approximately [place] noted that the pt was dyssynchronous with the ventilator and noted alarms of low volume.  Respiratory therapy was called to bedside to assesses pt. Pt begin to "desat" and HR dropped to 30-40s w/ RT present. Possible IVR rhythm was noted on ECG. Pt was found to be pulseless. Code blue called and ACLS protocol was initiated. ROSC after 2 min of CPR and 1 dose of Epi. CCM MD arrived to provide further care/instructions for pt.    Thamas Jaegers

## 2018-10-13 NOTE — Progress Notes (Signed)
Nutrition Brief Note  RD consulted for tube feeding initiation and management.   Per review of chart. Pt already has active orders.  Though she has been initiated on CRRT, her kcal/protein recommendations (based on ASPEN/SCCM recommendations for BMI >50) have not changed.   No nutrition interventions warranted at this time. If nutrition issues arise, please consult RD.   Burtis Junes RD, LDN, CNSC Clinical Nutrition Available Tues-Sat via Pager: [phone] 10/13/2018 12:50 PM

## 2018-10-13 NOTE — Progress Notes (Signed)
Subjective:  Pt had a busy night- had HD cath placed, then desat with brief arrest- ROSC after 2 minutes-  Aline, pressors started, then bronch - and after that CRRT was started - BP is up and Fio2 is down to 40 this AM- essentially no UOP.  CRRT running well   Objective Vital signs in last 24 hours: Vitals:   10/13/18 0600 10/13/18 0700 10/13/18 0751 10/13/18 0752  BP:      Pulse: 83 85    Resp: 17 20    Temp: 97.9 F (36.6 C) 97.9 F (36.6 C)    TempSrc: Core     SpO2: 100% 100% 100% 100%  Weight:      Height:       Weight change: -0.7 kg  Intake/Output Summary (Last 24 hours) at 10/13/2018 0843 Last data filed at 10/13/2018 0800 Gross per 24 hour  Intake 584.69 ml  Output 1558 ml  Net -973.31 ml    Assessment/Plan: 56 year old black female with numerous medical problems including advanced CKD-status post AV fistula.  She is now status post cardiac arrest and has had acute on chronic renal failure 1.Renal- acute on advanced chronic renal failure after cardiac arrest.  Initially, she was making good urine and kidney function was stable.  Now, after blood pressure is controlled possibly has had some relative hypotension giving some ATN with worsening numbers.  CRRT initiated overnight- running well via temp cath placed 10/25- continue for the day- pulling 100 per hour and pressors being weaned- numbers already better  2. Hypertension/volume  -is volume overloaded but now blood pressure is soft.  She is only on a very low dose of Coreg plus pressors.  UF 100 per hour 3.  Hyperkalemia- improved  4. Anemia  --supportive care for now, dropping- latest iron stores good- start ESA  5.  Status post arrest-I guess thinking due to OSA-not sure if more significant cardiac work-up is required- now has happened again      [place]: Basic Metabolic Panel: Recent Labs  Lab 10/12/18 0417 10/12/18 2013 10/13/18 0409  NA 143 143 144  K 5.0 4.9 4.6  CL 108 107 106   CO2 23 25 26   GLUCOSE 164* 240* 182*  BUN 124* 142* 121*  CREATININE 4.91* 5.71* 4.91*  CALCIUM 10.2 10.1 10.0  PHOS 6.3* 6.2* 4.9*   Liver Function Tests: Recent Labs  Lab 10/07/18 1947 10/12/18 2013 10/13/18 0409  AST 52* 75*  --   ALT 25 42  --   ALKPHOS 79 94  --   BILITOT 0.5 0.6  --   PROT 7.4 6.8  --   ALBUMIN 3.2* 2.3* 2.1*   No results for input(s): LIPASE, AMYLASE in the last 168 hours. No results for input(s): AMMONIA in the last 168 hours. CBC: Recent Labs  Lab 10/07/18 1947  10/10/18 0422 10/11/18 0425 10/12/18 0417 10/12/18 2013 10/13/18 0409  WBC 10.3   < > 13.9* 18.9* 20.5* 18.6* 16.0*  NEUTROABS 7.7  --   --   --   --   --   --   HGB 10.2*   < > 9.5* 9.7* 9.2* 8.9* 8.6*  HCT 36.3   < > 34.5* 34.8* 34.1* 32.0* 31.1*  MCV 90.1   < > 88.9 90.2 90.9 90.7 89.4  PLT 227   < > 186 201 218 233 221   < > = values in this interval not displayed.   Cardiac Enzymes: Recent  Labs  Lab 10/07/18 2256 10/08/18 0309 10/08/18 0856 10/08/18 1708  TROPONINI 0.08* 0.10* 0.08* 0.06*   CBG: Recent Labs  Lab 10/12/18 1557 10/12/18 2210 10/12/18 2340 10/13/18 0412 10/13/18 0751  GLUCAP 183* 91 165* 153* 142*    Iron Studies: No results for input(s): IRON, TIBC, TRANSFERRIN, FERRITIN in the last 72 hours. Studies/Results: Dg Chest Port 1 View  Result Date: 10/13/2018 CLINICAL DATA:  57 year old female with respiratory failure status post cardiac arrest. EXAM: PORTABLE CHEST 1 VIEW COMPARISON:  10/12/2018 and earlier. FINDINGS: Portable AP semi upright view at 0504 hours. Stable endotracheal tube tip just below the clavicles. Enteric tube courses to the left abdomen, tip not included. Bilateral IJ approach central lines remain in place. Pacer or resuscitation pads project over the left chest. Mildly lower lung volumes and more kyphotic view. Stable cardiac size and mediastinal contours. Coarse and indistinct bilateral pulmonary interstitial opacity persists. No  pneumothorax or definite pleural effusion. Paucity of bowel gas in the upper abdomen. IMPRESSION: 1.  Stable lines and tubes. 2. Persistent coarse bilateral pulmonary interstitial opacity. Top differential considerations include interstitial edema, atypical respiratory infection, ARDS. No pleural effusion is evident. Electronically Signed   By: Genevie Ann M.D.   On: 10/13/2018 07:45   Dg Chest Port 1 View  Result Date: 10/12/2018 CLINICAL DATA:  Hypoxia EXAM: PORTABLE CHEST 1 VIEW COMPARISON:  October 12, 2018 FINDINGS: Endotracheal tube tip is 5.5 cm above the carina. Nasogastric tube tip and side port are below the diaphragm. Right central catheter tip is in the superior vena cava. Left central catheter tip is in left innominate vein. No pneumothorax. There is no edema or consolidation. Heart is mildly enlarged with pulmonary vascularity normal. No adenopathy. No evident bone lesions. IMPRESSION: Tube and catheter positions as described without pneumothorax. No edema or consolidation. Mild cardiac enlargement. Electronically Signed   By: Lowella Grip III M.D.   On: 10/12/2018 20:07   Dg Chest Port 1 View  Result Date: 10/12/2018 CLINICAL DATA:  Central line placement. EXAM: PORTABLE CHEST 1 VIEW COMPARISON:  10/12/2018. FINDINGS: Interim placement of right IJ line. Its tip is over the superior vena cava. Left IJ line in stable position. Endotracheal tube and NG tube in stable position. Cardiomegaly. Pulmonary vascular prominence. Mild right base infiltrate. No pleural effusion or pneumothorax. IMPRESSION: 1. Interim placement right IJ line, its tip is over the superior vena cava. Endotracheal tube, NG tube, left IJ line stable position. 2.  Cardiomegaly with pulmonary venous congestion. 3.  Right base infiltrate. Electronically Signed   By: Marcello Moores  Register   On: 10/12/2018 17:04   Dg Chest Port 1 View  Result Date: 10/12/2018 CLINICAL DATA:  Intubation.  Respiratory failure. EXAM: PORTABLE CHEST  1 VIEW COMPARISON:  10/09/2018. FINDINGS: Endotracheal tube has been withdrawn and its tip is now 2.7 cm above the carina. NG tube noted with tip below left hemidiaphragm. Left IJ line noted with tip over the proximal SVC. Cardiomegaly with bilateral pulmonary venous congestion and infiltrates/edema. No pleural effusion or pneumothorax. IMPRESSION: 1. Endotracheal tube has been withdrawn, its tip is now 2.7 cm above the carina. NG tube and left IJ line stable position. 2. Cardiomegaly with bilateral pulmonary infiltrates/edema. Small left pleural effusion. Similar findings noted on prior exam. Electronically Signed   By: [place]   On: 10/12/2018 10:51   Medications: Infusions: . sodium chloride 10 mL/hr at 10/10/18 1600  . fentaNYL infusion INTRAVENOUS 100 mcg/hr (10/13/18 0700)  . heparin 10,000  units/ 20 mL infusion syringe 1,400 Units/hr (10/13/18 0811)  . heparin 999 mL/hr at 10/12/18 2229  . norepinephrine (LEVOPHED) Adult infusion 7 mcg/min (10/13/18 0700)  . prismasol BGK 4/2.5 500 mL/hr at 10/13/18 0835  . prismasol BGK 4/2.5 300 mL/hr at 10/12/18 2251  . prismasol BGK 4/2.5 1,000 mL/hr at 10/13/18 0422    Scheduled Medications: . aspirin  81 mg Oral Daily  . carvedilol  6.25 mg Oral BID WC  . chlorhexidine gluconate (MEDLINE KIT)  15 mL Mouth Rinse BID  . Chlorhexidine Gluconate Cloth  6 each Topical Daily  . feeding supplement (PRO-STAT SUGAR FREE 64)  30 mL Per Tube TID  . feeding supplement (VITAL HIGH PROTEIN)  1,000 mL Per Tube Q24H  . heparin injection (subcutaneous)  5,000 Units Subcutaneous Q8H  . insulin aspart  1-3 Units Subcutaneous Q4H  . ipratropium-albuterol  3 mL Nebulization Q6H  . mouth rinse  15 mL Mouth Rinse 10 times per day  . pantoprazole sodium  40 mg Per Tube Daily  . rosuvastatin  40 mg Per Tube QPM  . sodium chloride flush  10-40 mL Intracatheter Q12H  . THROMBI-PAD  1 each Topical Once    have reviewed scheduled and prn  medications.  Physical Exam: General: sedated, intubated- obese Heart: RRR Lungs: dec BS at bases Abdomen: obese, soft non tender Extremities: pitting edema  Dialysis Access: right IJ vascath placed 10/25- also left upper arm AVF- patent     10/13/2018,8:43 AM  LOS: 6 days

## 2018-10-14 ENCOUNTER — Inpatient Hospital Stay (HOSPITAL_COMMUNITY): Payer: Medicare Other

## 2018-10-14 LAB — CBC WITH DIFFERENTIAL/PLATELET
Abs Immature Granulocytes: 0.08 10*3/uL — ABNORMAL HIGH (ref 0.00–0.07)
BASOS ABS: 0 10*3/uL (ref 0.0–0.1)
Basophils Relative: 0 %
EOS PCT: 2 %
Eosinophils Absolute: 0.3 10*3/uL (ref 0.0–0.5)
HEMATOCRIT: 31.3 % — AB (ref 36.0–46.0)
HEMOGLOBIN: 8.3 g/dL — AB (ref 12.0–15.0)
Immature Granulocytes: 1 %
LYMPHS ABS: 1.4 10*3/uL (ref 0.7–4.0)
LYMPHS PCT: 11 %
MCH: 24.1 pg — ABNORMAL LOW (ref 26.0–34.0)
MCHC: 26.5 g/dL — AB (ref 30.0–36.0)
MCV: 90.7 fL (ref 80.0–100.0)
MONO ABS: 1.1 10*3/uL — AB (ref 0.1–1.0)
MONOS PCT: 9 %
Neutro Abs: 9.4 10*3/uL — ABNORMAL HIGH (ref 1.7–7.7)
Neutrophils Relative %: 77 %
Platelets: 201 10*3/uL (ref 150–400)
RBC: 3.45 MIL/uL — ABNORMAL LOW (ref 3.87–5.11)
RDW: 15.8 % — ABNORMAL HIGH (ref 11.5–15.5)
WBC: 12.3 10*3/uL — ABNORMAL HIGH (ref 4.0–10.5)
nRBC: 0.2 % (ref 0.0–0.2)

## 2018-10-14 LAB — GLUCOSE, CAPILLARY
GLUCOSE-CAPILLARY: 141 mg/dL — AB (ref 70–99)
GLUCOSE-CAPILLARY: 148 mg/dL — AB (ref 70–99)
GLUCOSE-CAPILLARY: 160 mg/dL — AB (ref 70–99)
GLUCOSE-CAPILLARY: 162 mg/dL — AB (ref 70–99)
Glucose-Capillary: 123 mg/dL — ABNORMAL HIGH (ref 70–99)
Glucose-Capillary: 133 mg/dL — ABNORMAL HIGH (ref 70–99)

## 2018-10-14 LAB — TRIGLYCERIDES: Triglycerides: 140 mg/dL (ref <150)

## 2018-10-14 LAB — APTT: aPTT: 110 s — ABNORMAL HIGH (ref 24–36)

## 2018-10-14 LAB — RENAL FUNCTION PANEL
ALBUMIN: 2.1 g/dL — AB (ref 3.5–5.0)
ALBUMIN: 2.2 g/dL — AB (ref 3.5–5.0)
ANION GAP: 10 (ref 5–15)
Anion gap: 11 (ref 5–15)
BUN: 73 mg/dL — AB (ref 6–20)
BUN: 61 mg/dL — ABNORMAL HIGH (ref 6–20)
CALCIUM: 10.4 mg/dL — AB (ref 8.9–10.3)
CALCIUM: 10.1 mg/dL (ref 8.9–10.3)
CO2: 27 mmol/L (ref 22–32)
CO2: 26 mmol/L (ref 22–32)
CREATININE: 3.38 mg/dL — AB (ref 0.44–1.00)
Chloride: 102 mmol/L (ref 98–111)
Chloride: 104 mmol/L (ref 98–111)
Creatinine, Ser: 3.04 mg/dL — ABNORMAL HIGH (ref 0.44–1.00)
GFR calc Af Amer: 16 mL/min — ABNORMAL LOW (ref >60)
GFR, EST AFRICAN AMERICAN: 18 mL/min — AB (ref >60)
GFR, EST NON AFRICAN AMERICAN: 14 mL/min — AB (ref >60)
GFR, EST NON AFRICAN AMERICAN: 16 mL/min — AB (ref >60)
GLUCOSE: 141 mg/dL — AB (ref 70–99)
Glucose, Bld: 166 mg/dL — ABNORMAL HIGH (ref 70–99)
PHOSPHORUS: 3.9 mg/dL (ref 2.5–4.6)
POTASSIUM: 4.5 mmol/L (ref 3.5–5.1)
Phosphorus: 3.4 mg/dL (ref 2.5–4.6)
Potassium: 4.5 mmol/L (ref 3.5–5.1)
SODIUM: 140 mmol/L (ref 135–145)
SODIUM: 140 mmol/L (ref 135–145)

## 2018-10-14 LAB — POCT ACTIVATED CLOTTING TIME
ACTIVATED CLOTTING TIME: 202 s
ACTIVATED CLOTTING TIME: 197 s
ACTIVATED CLOTTING TIME: 213 s
Activated Clotting Time: 202 seconds
Activated Clotting Time: 213 seconds
Activated Clotting Time: 219 seconds
Activated Clotting Time: 230 seconds
Activated Clotting Time: 235 seconds

## 2018-10-14 LAB — MAGNESIUM: Magnesium: 2.4 mg/dL (ref 1.7–2.4)

## 2018-10-14 MED ORDER — PROPOFOL 1000 MG/100ML IV EMUL
5.0000 ug/kg/min | INTRAVENOUS | Status: DC
  Administered 2018-10-14: 5 ug/kg/min via INTRAVENOUS
  Administered 2018-10-15: 20 ug/kg/min via INTRAVENOUS
  Administered 2018-10-15: 5 ug/kg/min via INTRAVENOUS
  Administered 2018-10-16: 15 ug/kg/min via INTRAVENOUS
  Filled 2018-10-14 (×4): qty 100

## 2018-10-14 NOTE — Plan of Care (Signed)
  Problem: Clinical Measurements: Goal: Diagnostic test results will improve Outcome: Progressing Goal: Respiratory complications will improve Outcome: Progressing   Problem: Pain Managment: Goal: General experience of comfort will improve Outcome: Progressing   Problem: Safety: Goal: Ability to remain free from injury will improve Outcome: Progressing   Problem: Skin Integrity: Goal: Risk for impaired skin integrity will decrease Outcome: Progressing

## 2018-10-14 NOTE — Progress Notes (Signed)
Subjective:   CRRT running well- off pressors- minimal urine- removed just over 2 liters with CRRT - following commands   Objective Vital signs in last 24 hours: Vitals:   10/14/18 0600 10/14/18 0700 10/14/18 0742 10/14/18 0745  BP:      Pulse: 90 89  89  Resp: 12 (!) 26    Temp: 99.3 F (37.4 C) 99.3 F (37.4 C)  99.3 F (37.4 C)  TempSrc: Core Core  Core  SpO2: 99% 100% 100% 100%  Weight:      Height:       Weight change: -0.9 kg  Intake/Output Summary (Last 24 hours) at 10/14/2018 0820 Last data filed at 10/14/2018 0700 Gross per 24 hour  Intake 876.84 ml  Output 3032 ml  Net -2155.16 ml    Assessment/Plan: 57 year old black female with numerous medical problems including advanced CKD-status post AV fistula.  She is now status post cardiac arrest and has had acute on chronic renal failure 1.Renal- acute on advanced chronic renal failure after cardiac arrest.  Initially, she was making good urine and kidney function was stable.  Now, after blood pressure is controlled possibly has had some relative hypotension giving some ATN with worsening numbers.  CRRT initiated 10/25- running well - continue for now- pulling 100 per hour and pressors now off- working possibly toward extubation.  Hopefully can transition to IHD very soon  2. Hypertension/volume  -is volume overloaded but now blood pressure is soft.  She was only on a very low dose of Coreg now stopped.  UF 100 per hour- off pressors- CVP 10 cont that for now 3.  Hyperkalemia- improved  4. Anemia  --supportive care for now, dropping- latest iron stores good- started ESA  5.  Status post arrest-I guess thinking due to OSA-not sure if more significant cardiac work-up is required- now has happened again      [place]: Basic Metabolic Panel: Recent Labs  Lab 10/13/18 0409 10/13/18 1617 10/14/18 0421  NA 144 141 140  K 4.6 4.6 4.5  CL 106 108 102  CO2 _0 GLUCOSE 182* 141* 141*  BUN 121*  90* 73*  CREATININE 4.91* 3.88* 3.38*  CALCIUM 10.0 9.8 10.4*  PHOS 4.9* 4.2 3.9   Liver Function Tests: Recent Labs  Lab 10/07/18 1947 10/12/18 2013 10/13/18 0409 10/13/18 1617 10/14/18 0421  AST 52* 75*  --   --   --   ALT 25 42  --   --   --   ALKPHOS 79 94  --   --   --   BILITOT 0.5 0.6  --   --   --   PROT 7.4 6.8  --   --   --   ALBUMIN 3.2* 2.3* 2.1* 2.2* 2.1*   No results for input(s): LIPASE, AMYLASE in the last 168 hours. No results for input(s): AMMONIA in the last 168 hours. CBC: Recent Labs  Lab 10/07/18 1947  10/11/18 0425 10/12/18 0417 10/12/18 2013 10/13/18 0409 10/14/18 0421  WBC 10.3   < > 18.9* 20.5* 18.6* 16.0* 12.3*  NEUTROABS 7.7  --   --   --   --   --  9.4*  HGB 10.2*   < > 9.7* 9.2* 8.9* 8.6* 8.3*  HCT 36.3   < > 34.8* 34.1* 32.0* 31.1* 31.3*  MCV 90.1   < > 90.2 90.9 90.7 89.4 90.7  PLT 227   < > 201 218 233  221 201   < > = values in this interval not displayed.   Cardiac Enzymes: Recent Labs  Lab 10/07/18 2256 10/08/18 0309 10/08/18 0856 10/08/18 1708  TROPONINI 0.08* 0.10* 0.08* 0.06*   CBG: Recent Labs  Lab 10/13/18 1200 10/13/18 1555 10/13/18 2005 10/14/18 0011 10/14/18 0412  GLUCAP 146* 131* 118* 160* 123*    Iron Studies: No results for input(s): IRON, TIBC, TRANSFERRIN, FERRITIN in the last 72 hours. Studies/Results: Dg Chest Port 1 View  Result Date: 10/13/2018 CLINICAL DATA:  Hypoxia EXAM: PORTABLE CHEST 1 VIEW COMPARISON:  October 13, 2018 FINDINGS: Endotracheal tube tip is 3.7 cm above the carina. Nasogastric tube tip and side port are below the diaphragm. Right jugular catheter tip is in the superior vena cava. Left jugular catheter tip is in the left innominate vein near the junction with the superior vena cava. No pneumothorax. There is atelectatic change in the right mid lung. There is a small focus of airspace consolidation in the medial right base. Lungs elsewhere are clear. There is cardiomegaly with  pulmonary vascularity normal. No adenopathy. No bone lesions. IMPRESSION: Tube and catheter positions as described without pneumothorax. Small focus of consolidation medial right base, felt to represent a focal area of pneumonia. Atelectasis right mid lung. Left lung clear. Heart mildly enlarged. Electronically Signed   By: Lowella Grip III M.D.   On: 10/13/2018 23:19   Dg Chest Port 1 View  Result Date: 10/13/2018 CLINICAL DATA:  57 year old female with respiratory failure status post cardiac arrest. EXAM: PORTABLE CHEST 1 VIEW COMPARISON:  10/12/2018 and earlier. FINDINGS: Portable AP semi upright view at 0504 hours. Stable endotracheal tube tip just below the clavicles. Enteric tube courses to the left abdomen, tip not included. Bilateral IJ approach central lines remain in place. Pacer or resuscitation pads project over the left chest. Mildly lower lung volumes and more kyphotic view. Stable cardiac size and mediastinal contours. Coarse and indistinct bilateral pulmonary interstitial opacity persists. No pneumothorax or definite pleural effusion. Paucity of bowel gas in the upper abdomen. IMPRESSION: 1.  Stable lines and tubes. 2. Persistent coarse bilateral pulmonary interstitial opacity. Top differential considerations include interstitial edema, atypical respiratory infection, ARDS. No pleural effusion is evident. Electronically Signed   By: Genevie Ann M.D.   On: 10/13/2018 07:45   Dg Chest Port 1 View  Result Date: 10/12/2018 CLINICAL DATA:  Hypoxia EXAM: PORTABLE CHEST 1 VIEW COMPARISON:  October 12, 2018 FINDINGS: Endotracheal tube tip is 5.5 cm above the carina. Nasogastric tube tip and side port are below the diaphragm. Right central catheter tip is in the superior vena cava. Left central catheter tip is in left innominate vein. No pneumothorax. There is no edema or consolidation. Heart is mildly enlarged with pulmonary vascularity normal. No adenopathy. No evident bone lesions. IMPRESSION:  Tube and catheter positions as described without pneumothorax. No edema or consolidation. Mild cardiac enlargement. Electronically Signed   By: Lowella Grip III M.D.   On: 10/12/2018 20:07   Dg Chest Port 1 View  Result Date: 10/12/2018 CLINICAL DATA:  Central line placement. EXAM: PORTABLE CHEST 1 VIEW COMPARISON:  10/12/2018. FINDINGS: Interim placement of right IJ line. Its tip is over the superior vena cava. Left IJ line in stable position. Endotracheal tube and NG tube in stable position. Cardiomegaly. Pulmonary vascular prominence. Mild right base infiltrate. No pleural effusion or pneumothorax. IMPRESSION: 1. Interim placement right IJ line, its tip is over the superior vena cava. Endotracheal tube, NG tube, left  IJ line stable position. 2.  Cardiomegaly with pulmonary venous congestion. 3.  Right base infiltrate. Electronically Signed   By: [place]   On: 10/12/2018 17:04   Medications: Infusions: . sodium chloride 10 mL/hr at 10/10/18 1600  . fentaNYL infusion INTRAVENOUS 125 mcg/hr (10/14/18 0700)  . heparin 10,000 units/ 20 mL infusion syringe 1,850 Units/hr (10/14/18 0427)  . heparin 999 mL/hr at 10/12/18 2229  . norepinephrine (LEVOPHED) Adult infusion Stopped (10/13/18 1114)  . prismasol BGK 4/2.5 500 mL/hr at 10/14/18 0430  . prismasol BGK 4/2.5 300 mL/hr at 10/13/18 1610  . prismasol BGK 4/2.5 1,000 mL/hr at 10/14/18 0559    Scheduled Medications: . aspirin  81 mg Oral Daily  . chlorhexidine gluconate (MEDLINE KIT)  15 mL Mouth Rinse BID  . Chlorhexidine Gluconate Cloth  6 each Topical Daily  . darbepoetin (ARANESP) injection - DIALYSIS  100 mcg Intravenous Q Sat-HD  . feeding supplement (PRO-STAT SUGAR FREE 64)  30 mL Per Tube TID  . feeding supplement (VITAL HIGH PROTEIN)  1,000 mL Per Tube Q24H  . heparin injection (subcutaneous)  5,000 Units Subcutaneous Q8H  . insulin aspart  1-3 Units Subcutaneous Q4H  . ipratropium-albuterol  3 mL Nebulization Q6H  .  mouth rinse  15 mL Mouth Rinse 10 times per day  . pantoprazole sodium  40 mg Per Tube Daily  . rosuvastatin  10 mg Per Tube QPM  . sodium chloride flush  10-40 mL Intracatheter Q12H  . THROMBI-PAD  1 each Topical Once    have reviewed scheduled and prn medications.  Physical Exam: General: sedated, intubated- obese Heart: RRR Lungs: dec BS at bases Abdomen: obese, soft non tender Extremities: pitting edema  Dialysis Access: right IJ vascath placed 10/25- also left upper arm AVF- patent     10/14/2018,8:20 AM  LOS: 7 days

## 2018-10-14 NOTE — Progress Notes (Signed)
[facility] Progress Note Patient Name: NAILEA WHITEHORN DOB: 11-Jul-1961 MRN: [id number]   Date of Service  10/14/2018  HPI/Events of Note  Portable CXR no pneumothorax and no [place] emphysema noted.   eICU Interventions  Continue present management.      Intervention Category Major Interventions: Other:  Marnita Poirier Cornelia Copa 10/14/2018, 12:54 AM

## 2018-10-14 NOTE — Progress Notes (Signed)
[facility] Progress Note Patient Name: Catherine Williams DOB: June 18, 1961 MRN: [id number]   Date of Service  10/14/2018  HPI/Events of Note  Agitation - Nursing request for sedation.  eICU Interventions  Will order: 1. Propofol IV infusion. Titrate to RASS = 0 to -1.      Intervention Category Major Interventions: Delirium, psychosis, severe agitation - evaluation and management  Sommer,Steven Eugene 10/14/2018, 8:46 PM

## 2018-10-14 NOTE — Progress Notes (Signed)
NAME:  Catherine Williams, MRN:  [id number], DOB:  [birth year], LOS: 7 ADMISSION DATE:  10/07/2018, CONSULTATION DATE:  10/07/2018 REFERRING MD:  Dr. Benjamine Mola , CHIEF COMPLAINT:  Cardiac Arrest    Brief History   57 year old female presents to ED on 10/20 s/p Witnessed Arrest. Family reports that patient was of normal state of health, this afternoon she was sitting on couch with daughter when she was noted to be breathing "funny", when daughter tired to stimulate patient she was unresponsive at 1830. EMS arrived 1840 and patient was noted to be pulseless. ROSC achieved at 1900 after 2 shocks and CPR. On arrival to ED patient remained unresponsive. LA 1.38. K 4.2. WBC 10.3. BP 174/80. Cardiology consulted. PCCM asked to admit.   Past Medical History  Diastolic HF (EF 82-42, P5TI), COPD, PVD, Chronic Kidney Disease (Placement of Fistula on 10/17)  Significant Hospital Events   10/20 > Presents to ED s/p Cardiac Arrest  10/22 rewarmed at 1pm 10/24 - not awake enough to extubate but did follow commands  10/25 - sister from [place] at bedside. Daugther at bedside. Both very concerned she is not fully arousable. Per RN/family - can arouse to deep voice and track and nod and follow simple commands very briefly. Then falls asleep. Only 100cc./h urine so far. Per family - s/p LUE fistula recently in anticipation of future HD needs. Baseline creat 3.8s. Creat up at 4.83m%. Office renal doc -> Dr CMarval Regal    10/26 - 2 min PEA arrest with x  1 epi last night. Related to mucus plugging seen in ET tube via bronchoscopy. Sp BAL in RML Started on CRRT overnight -> This aM on CRRT, 40% fio2, PRVC and fentanyl and levophed gtt -> RASS -1 and followed simple commands. Seems weak  Consults: date of consult/date signed off & final recs:  Cardiology 10/20 PCCM 10/20  Procedures (surgical and bedside):  ETT 10/20 >> Left IJ 10/20 >>   Significant Diagnostic Tests:  CXR 10/20 > Heart size is mildly enlarged.  Mild vascular congestion. No confluent opacities, effusions or pneumothorax. Suspect left lateral rib fractures, but difficult to visualize. CT Head 10/20 >>neg ECHO 10/21 >> nml LVEF, gr 2 DD EEG 10/21 >> slowing. No epileptiform activity.  Venous doppler legs 10/21 > no evidence DVT HD cath 10/25 - start CRRT  Micro Data:  Blood 10/20 >> Sputum 10/20 >> U/A 10/20 >>     Antimicrobials:  N/A     SUBJECTIVE/OVERNIGHT/INTERVAL HX  10/26 -  2L off with CRRT. Minimal ur op. Following commands but weakly. On fent gtt. Off levophed   Objective   Blood pressure (!) 95/40, pulse 88, temperature 99.1 F (37.3 C), resp. rate 16, height _0  (1.575 m), weight (!) 139.9 kg, SpO2 99 %. CVP:  [6 mmHg-10 mmHg] 10 mmHg  Vent Mode: PCV FiO2 (%):  [40 %] 40 % Set Rate:  [15 bmp] 15 bmp PEEP:  [10 cmH20] 10 cmH20 Plateau Pressure:  [15 cmH20-31 cmH20] 15 cmH20   Intake/Output Summary (Last 24 hours) at 10/14/2018 0934 Last data filed at 10/14/2018 0900 Gross per 24 hour  Intake 906.85 ml  Output 3212 ml  Net -2305.15 ml   Filed Weights   10/12/18 0429 10/13/18 0500 10/14/18 0500  Weight: (!) 141.5 kg (!) 140.8 kg (!) 139.9 kg     General Appearance:  Looks criticall ill OBESE - + Head:  Normocephalic, without obvious abnormality, atraumatic Eyes:  PERRL -  yes, conjunctiva/corneas - clear     Ears:  Normal external ear canals, both ears Nose:  G tube - no Throat:  ETT TUBE - yes , OG tube - yes Neck:  Supple,  No enlargement/tenderness/nodules Lungs: Clear to auscultation bilaterally, Ventilator   Synchrony - yes Heart:  S1 and S2 normal, no murmur, CVP - no.  Pressors - pff Abdomen:  Soft, no masses, no organomegaly Genitalia / Rectal:  Not done Extremities:  Extremities- intact Skin:  ntact in exposed areas . Sacral area - not examined Neurologic:  Sedation - fent gtt -> RASS - -2/-3       Assessment & Plan:   Acute Hypercarbic Respiratory Failure suspected  secondary to decompensated Heart Failure   H/O COPD, OSA on CPAP at HS    10/14/2018 - > does not meet criteria for SBT/Extubation in setting of Acute Respiratory Failure due to obtundation in setting of sedation and post cardiac arrest   Plan  - VAP Bundle - PRVC (apparently on PCV now but will change her back to [facility])  V.Fib/PEA Cardiac Arrest - elevated troponin as expected. No evidence of DVT.  Decompensated Heart Failure  H/O Diastolic HF (EF 16-10, R6EA) S/p repeat arrest 10/12/18 due to mucus plug -EKG with LVH and QTC 480  10/14/2018 - off levophed  Plan  - Cardiac Monitoring  - Continue ASA, Crestor - aline ok through 10/15/18 - hold coreg - decide on 10/15/18 to restart v hold - levophed for MAP > 65  Acute on Chronic Stage IV Kidney Disease  Baseline Crt high 3s. . Recent placement of UE Fistula .   10/14/2018 - on CRRT since 10/12/18 due to post arrest ATN. Creat 4.9 and volume overloaded as of 10/12/18. 2L Volume down  Plan  -CRRT per renal  Hypoglycemia -resolved H/O DM Plan  -SSI sensitive -Hold home Lantus  -PRN Dextrose   Acute Encephalopathy  -UDS negative  10/14/2018 improved after CRRT start. Currently fent gtt barrier to true assessment  Plan  - monitor - dc fent gtt - if needed diprivan gtt - continue fent prn - RASS goal 0 to -1  Disposition / Summary of Today's Plan 10/14/18   No extubation 10/14/2018 . Continue CRRT     Diet: TF SUP: Protonix VAP bundle: Yes VTE ppx: heparin ACS dose Code Status: FC Family Communication: no family at bedside 10/14/2018 (last update 10/12/18)    [place]   The patient Catherine Williams is critically ill with multiple organ systems failure and requires high complexity decision making for assessment and support, frequent evaluation and titration of therapies, application of advanced monitoring technologies and extensive interpretation of multiple databases.   Critical Care Time  devoted to patient care services described in this note is  30  Minutes. This time reflects time of care of this signee Dr Brand Males. This critical care time does not reflect procedure time, or teaching time or supervisory time of PA/NP/Med student/Med Resident etc but could involve care discussion time     Dr. Brand Males, M.D., [facility].C.P Pulmonary and Critical Care Medicine Staff Physician [place] [facility] Pager: [phone], If no answer or between  15:00h - 7:00h: call [phone]  10/14/2018 9:34 AM    LABS    PULMONARY Recent Labs  Lab 10/07/18 2342 10/08/18 0049 10/08/18 0523 10/09/18 0512 10/12/18 2012 10/12/18 2259 10/13/18 0319  PHART 7.299*  --   --  7.440 7.134* 7.301* 7.317*  PCO2ART 51.0*  --   --  34.3 80.5* 60.1* 51.7*  PO2ART 81.0*  --   --  84.0 56.0* 351.0* 93.6  HCO3 25.5  --   --  24.0 27.1 29.6* 26.3  TCO2 27  --  _0 --   O2SAT 95.0 89.4  --  98.0 77.0 100.0 97.4    CBC Recent Labs  Lab 10/12/18 2013 10/13/18 0409 10/14/18 0421  HGB 8.9* 8.6* 8.3*  HCT 32.0* 31.1* 31.3*  WBC 18.6* 16.0* 12.3*  PLT 233 221 201    COAGULATION Recent Labs  Lab 10/07/18 1947 10/07/18 2256 10/08/18 0618  INR 1.08 1.13 1.13    CARDIAC   Recent Labs  Lab 10/07/18 2256 10/08/18 0309 10/08/18 0856 10/08/18 1708  TROPONINI 0.08* 0.10* 0.08* 0.06*   No results for input(s): PROBNP in the last 168 hours.   CHEMISTRY Recent Labs  Lab 10/10/18 1609  10/12/18 0417 10/12/18 2013 10/13/18 0409 10/13/18 1617 10/14/18 0421  NA  --    < > 143 143 144 141 140  K  --    < > 5.0 4.9 4.6 4.6 4.5  CL  --    < > 108 107 106 108 102  CO2  --    < > _1 GLUCOSE  --    < > 164* 240* 182* 141* 141*  BUN  --    < > 124* 142* 121* 90* 73*  CREATININE  --    < > 4.91* 5.71* 4.91* 3.88* 3.38*  CALCIUM  --    < > 10.2 10.1 10.0 9.8 10.4*  MG 2.0  --  2.1 2.2 2.3  --  2.4  PHOS 4.7*  --   6.3* 6.2* 4.9* 4.2 3.9   < > = values in this interval not displayed.   Estimated Creatinine Clearance: 24.3 mL/min (A) (by C-G formula based on SCr of 3.38 mg/dL (H)).   LIVER Recent Labs  Lab 10/07/18 1947 10/07/18 2256 10/08/18 0618 10/12/18 2013 10/13/18 0409 10/13/18 1617 10/14/18 0421  AST 52*  --   --  75*  --   --   --   ALT 25  --   --  42  --   --   --   ALKPHOS 79  --   --  94  --   --   --   BILITOT 0.5  --   --  0.6  --   --   --   PROT 7.4  --   --  6.8  --   --   --   ALBUMIN 3.2*  --   --  2.3* 2.1* 2.2* 2.1*  INR 1.08 1.13 1.13  --   --   --   --      INFECTIOUS Recent Labs  Lab 10/07/18 1948 10/07/18 2256 10/08/18 0618 10/09/18 0505  LATICACIDVEN 1.38  --   --   --   PROCALCITON  --  <0.10 0.16 0.25     ENDOCRINE CBG (last 3)  Recent Labs    10/14/18 0011 10/14/18 0412 10/14/18 0835  GLUCAP 160* 123* 162*         IMAGING x48h  - image(s) personally visualized  -   highlighted in bold Dg Chest Port 1 View  Result Date: 10/13/2018 CLINICAL DATA:  Hypoxia EXAM: PORTABLE CHEST 1 VIEW COMPARISON:  October 13, 2018 FINDINGS: Endotracheal tube tip is  3.7 cm above the carina. Nasogastric tube tip and side port are below the diaphragm. Right jugular catheter tip is in the superior vena cava. Left jugular catheter tip is in the left innominate vein near the junction with the superior vena cava. No pneumothorax. There is atelectatic change in the right mid lung. There is a small focus of airspace consolidation in the medial right base. Lungs elsewhere are clear. There is cardiomegaly with pulmonary vascularity normal. No adenopathy. No bone lesions. IMPRESSION: Tube and catheter positions as described without pneumothorax. Small focus of consolidation medial right base, felt to represent a focal area of pneumonia. Atelectasis right mid lung. Left lung clear. Heart mildly enlarged. Electronically Signed   By: Lowella Grip III M.D.   On: 10/13/2018  23:19   Dg Chest Port 1 View  Result Date: 10/13/2018 CLINICAL DATA:  57 year old female with respiratory failure status post cardiac arrest. EXAM: PORTABLE CHEST 1 VIEW COMPARISON:  10/12/2018 and earlier. FINDINGS: Portable AP semi upright view at 0504 hours. Stable endotracheal tube tip just below the clavicles. Enteric tube courses to the left abdomen, tip not included. Bilateral IJ approach central lines remain in place. Pacer or resuscitation pads project over the left chest. Mildly lower lung volumes and more kyphotic view. Stable cardiac size and mediastinal contours. Coarse and indistinct bilateral pulmonary interstitial opacity persists. No pneumothorax or definite pleural effusion. Paucity of bowel gas in the upper abdomen. IMPRESSION: 1.  Stable lines and tubes. 2. Persistent coarse bilateral pulmonary interstitial opacity. Top differential considerations include interstitial edema, atypical respiratory infection, ARDS. No pleural effusion is evident. Electronically Signed   By: Genevie Ann M.D.   On: 10/13/2018 07:45   Dg Chest Port 1 View  Result Date: 10/12/2018 CLINICAL DATA:  Hypoxia EXAM: PORTABLE CHEST 1 VIEW COMPARISON:  October 12, 2018 FINDINGS: Endotracheal tube tip is 5.5 cm above the carina. Nasogastric tube tip and side port are below the diaphragm. Right central catheter tip is in the superior vena cava. Left central catheter tip is in left innominate vein. No pneumothorax. There is no edema or consolidation. Heart is mildly enlarged with pulmonary vascularity normal. No adenopathy. No evident bone lesions. IMPRESSION: Tube and catheter positions as described without pneumothorax. No edema or consolidation. Mild cardiac enlargement. Electronically Signed   By: Lowella Grip III M.D.   On: 10/12/2018 20:07   Dg Chest Port 1 View  Result Date: 10/12/2018 CLINICAL DATA:  Central line placement. EXAM: PORTABLE CHEST 1 VIEW COMPARISON:  10/12/2018. FINDINGS: Interim placement of  right IJ line. Its tip is over the superior vena cava. Left IJ line in stable position. Endotracheal tube and NG tube in stable position. Cardiomegaly. Pulmonary vascular prominence. Mild right base infiltrate. No pleural effusion or pneumothorax. IMPRESSION: 1. Interim placement right IJ line, its tip is over the superior vena cava. Endotracheal tube, NG tube, left IJ line stable position. 2.  Cardiomegaly with pulmonary venous congestion. 3.  Right base infiltrate. Electronically Signed   By: Marcello Moores  Register   On: 10/12/2018 17:04

## 2018-10-15 LAB — CULTURE, RESPIRATORY W GRAM STAIN: Culture: NORMAL

## 2018-10-15 LAB — CBC WITH DIFFERENTIAL/PLATELET
Abs Immature Granulocytes: 0.15 10*3/uL — ABNORMAL HIGH (ref 0.00–0.07)
BASOS ABS: 0 10*3/uL (ref 0.0–0.1)
Basophils Relative: 0 %
EOS ABS: 0.3 10*3/uL (ref 0.0–0.5)
EOS PCT: 2 %
HCT: 31.8 % — ABNORMAL LOW (ref 36.0–46.0)
HEMOGLOBIN: 8.5 g/dL — AB (ref 12.0–15.0)
Immature Granulocytes: 1 %
LYMPHS ABS: 1.6 10*3/uL (ref 0.7–4.0)
Lymphocytes Relative: 12 %
MCH: 24.3 pg — AB (ref 26.0–34.0)
MCHC: 26.7 g/dL — AB (ref 30.0–36.0)
MCV: 90.9 fL (ref 80.0–100.0)
MONOS PCT: 9 %
Monocytes Absolute: 1.2 10*3/uL — ABNORMAL HIGH (ref 0.1–1.0)
NRBC: 0.8 % — AB (ref 0.0–0.2)
Neutro Abs: 9.9 10*3/uL — ABNORMAL HIGH (ref 1.7–7.7)
Neutrophils Relative %: 76 %
Platelets: 196 10*3/uL (ref 150–400)
RBC: 3.5 MIL/uL — ABNORMAL LOW (ref 3.87–5.11)
RDW: 15.6 % — AB (ref 11.5–15.5)
WBC: 13.1 10*3/uL — ABNORMAL HIGH (ref 4.0–10.5)

## 2018-10-15 LAB — RENAL FUNCTION PANEL
ALBUMIN: 2.2 g/dL — AB (ref 3.5–5.0)
Albumin: 2.2 g/dL — ABNORMAL LOW (ref 3.5–5.0)
Anion gap: 9 (ref 5–15)
Anion gap: 10 (ref 5–15)
BUN: 53 mg/dL — AB (ref 6–20)
BUN: 51 mg/dL — ABNORMAL HIGH (ref 6–20)
CALCIUM: 10.4 mg/dL — AB (ref 8.9–10.3)
CO2: 25 mmol/L (ref 22–32)
CO2: 26 mmol/L (ref 22–32)
CREATININE: 2.83 mg/dL — AB (ref 0.44–1.00)
CREATININE: 2.65 mg/dL — AB (ref 0.44–1.00)
Calcium: 10.3 mg/dL (ref 8.9–10.3)
Chloride: 104 mmol/L (ref 98–111)
Chloride: 103 mmol/L (ref 98–111)
GFR calc Af Amer: 20 mL/min — ABNORMAL LOW (ref >60)
GFR calc non Af Amer: 19 mL/min — ABNORMAL LOW (ref >60)
GFR, EST AFRICAN AMERICAN: 22 mL/min — AB (ref >60)
GFR, EST NON AFRICAN AMERICAN: 17 mL/min — AB (ref >60)
Glucose, Bld: 186 mg/dL — ABNORMAL HIGH (ref 70–99)
Glucose, Bld: 199 mg/dL — ABNORMAL HIGH (ref 70–99)
PHOSPHORUS: 2.9 mg/dL (ref 2.5–4.6)
POTASSIUM: 4.4 mmol/L (ref 3.5–5.1)
Phosphorus: 3.2 mg/dL (ref 2.5–4.6)
Potassium: 4.6 mmol/L (ref 3.5–5.1)
SODIUM: 139 mmol/L (ref 135–145)
Sodium: 138 mmol/L (ref 135–145)

## 2018-10-15 LAB — GLUCOSE, CAPILLARY
GLUCOSE-CAPILLARY: 182 mg/dL — AB (ref 70–99)
Glucose-Capillary: 148 mg/dL — ABNORMAL HIGH (ref 70–99)
Glucose-Capillary: 152 mg/dL — ABNORMAL HIGH (ref 70–99)
Glucose-Capillary: 181 mg/dL — ABNORMAL HIGH (ref 70–99)
Glucose-Capillary: 188 mg/dL — ABNORMAL HIGH (ref 70–99)

## 2018-10-15 LAB — APTT: aPTT: 110 seconds — ABNORMAL HIGH (ref 24–36)

## 2018-10-15 LAB — MAGNESIUM: MAGNESIUM: 2.7 mg/dL — AB (ref 1.7–2.4)

## 2018-10-15 LAB — CULTURE, RESPIRATORY

## 2018-10-15 LAB — POCT ACTIVATED CLOTTING TIME
ACTIVATED CLOTTING TIME: 191 s
Activated Clotting Time: 197 seconds
Activated Clotting Time: 197 seconds
Activated Clotting Time: 208 seconds
Activated Clotting Time: 219 seconds

## 2018-10-15 MED ORDER — ACETAMINOPHEN 160 MG/5ML PO SOLN
650.0000 mg | Freq: Four times a day (QID) | ORAL | Status: DC | PRN
  Administered 2018-10-15 – 2018-10-27 (×7): 650 mg via ORAL
  Filled 2018-10-15 (×9): qty 20.3

## 2018-10-15 MED ORDER — LACTULOSE 10 GM/15ML PO SOLN
30.0000 g | Freq: Once | ORAL | Status: AC
  Administered 2018-10-15: 30 g
  Filled 2018-10-15: qty 45

## 2018-10-15 MED ORDER — BISACODYL 10 MG RE SUPP: 10.0000 mg | Freq: Every day | RECTAL | Status: DC | PRN

## 2018-10-15 MED ORDER — CARVEDILOL 3.125 MG PO TABS
3.1250 mg | ORAL_TABLET | Freq: Two times a day (BID) | ORAL | Status: DC
  Administered 2018-10-15 – 2018-10-17 (×4): 3.125 mg via ORAL
  Filled 2018-10-15 (×4): qty 1

## 2018-10-15 MED ORDER — SODIUM CHLORIDE 0.9 % IV SOLN
2.0000 g | Freq: Two times a day (BID) | INTRAVENOUS | Status: DC
  Administered 2018-10-15 – 2018-10-17 (×5): 2 g via INTRAVENOUS
  Filled 2018-10-15 (×6): qty 2

## 2018-10-15 MED ORDER — DOCUSATE SODIUM 50 MG/5ML PO LIQD
100.0000 mg | Freq: Two times a day (BID) | ORAL | Status: AC
  Administered 2018-10-15 (×2): 100 mg
  Filled 2018-10-15 (×3): qty 10

## 2018-10-15 NOTE — Progress Notes (Signed)
Last seen by Dr. Johnsie Cancel on 10/22. Not felt to be primary cardiac etiology of arrest. No plans for cath at this time. Will sign-off for now. Please re-consult [place] if the need arises.  [facility] will sign off.   Medication Recommendations: none Other recommendations (labs, testing, etc):  none Follow up as an outpatient:  Dr. Burt Knack.  Pixie Casino, MD, [facility], [place] Director of the Advanced Lipid Disorders &  Cardiovascular Risk Reduction Clinic Diplomate of the American Board of Clinical Lipidology Attending Cardiologist  Direct Dial: [phone]  Fax: [phone]  Website:  [email or web address]

## 2018-10-15 NOTE — Progress Notes (Signed)
[facility] Progress Note    Assessment/ Plan:   57 year old black female with numerous medical problems including advanced CKD-status post AV fistula. She is now status post cardiac arrest and has had acute on chronic renal failure  1.Renal-acute on advanced chronic renal failure after cardiac arrest.Initially, she was making good urine and kidney function was stable. Now, after blood pressure is controlled possibly has had some relative hypotension giving some ATN with worsening numbers. CRRT initiated 10/25- running well - continue for now- pulling 100 per hour and pressors now off- working possibly toward extubation.  Hopefully can transition to IHD very soon  - No e/o renal recovery and she will be dialysis dependent now. - Plan on stopping the CRRT tomorrow AM and then trial of iHD on Wednesday. Will eventually transition to a tunneled catheter  2. Hypertension/volume-is volume overloaded but now blood pressure is better. She was only on a very low dose of Coreg now stopped. UF 100 per hour- off pressors 3.Hyperkalemia- improved  4. Anemia--supportive care for now, dropping- latest iron stores good- started ESA  5.Status post arrest-I guess thinking due to OSA-not sure if more significant cardiac work-up is required- now has happened again   Subjective:   Daughter bedside.  Not on pressors  Seen and tolerating CRRT 4K bath Pre/post/dialysate 500/300/1000 UF net 100 ml/hr   Objective:   BP (!) 158/53   Pulse 95   Temp (!) 100.8 F (38.2 C) (Core)   Resp (!) 28   Ht 5' 2"  (1.575 m)   Wt 134.5 kg   SpO2 100%   BMI 54.23 kg/m   Intake/Output Summary (Last 24 hours) at 10/15/2018 0754 Last data filed at 10/15/2018 0700 Gross per 24 hour  Intake 1152.33 ml  Output 3588 ml  Net -2435.67 ml   Weight change: -5.4 kg  Physical Exam: General: sedated, intubated- obese Heart: RRR Lungs: dec BS at bases Abdomen: obese, soft non  tender Extremities: pitting edema  Dialysis Access: right IJ vascath placed 10/25- also left upper arm AVF- patent  w/ good bruit  Imaging: Dg Chest Port 1 View  Result Date: 10/13/2018 CLINICAL DATA:  Hypoxia EXAM: PORTABLE CHEST 1 VIEW COMPARISON:  October 13, 2018 FINDINGS: Endotracheal tube tip is 3.7 cm above the carina. Nasogastric tube tip and side port are below the diaphragm. Right jugular catheter tip is in the superior vena cava. Left jugular catheter tip is in the left innominate vein near the junction with the superior vena cava. No pneumothorax. There is atelectatic change in the right mid lung. There is a small focus of airspace consolidation in the medial right base. Lungs elsewhere are clear. There is cardiomegaly with pulmonary vascularity normal. No adenopathy. No bone lesions. IMPRESSION: Tube and catheter positions as described without pneumothorax. Small focus of consolidation medial right base, felt to represent a focal area of pneumonia. Atelectasis right mid lung. Left lung clear. Heart mildly enlarged. Electronically Signed   By: Lowella Grip III M.D.   On: 10/13/2018 23:19    Labs: BMET Recent Labs  Lab 10/12/18 0417 10/12/18 2013 10/13/18 0409 10/13/18 1617 10/14/18 0421 10/14/18 1628 10/15/18 0405  NA 143 143 144 141 140 140 138  K 5.0 4.9 4.6 4.6 4.5 4.5 4.4  CL 108 107 106 108 102 104 104  CO2 23 25 26 28 27 26 25   GLUCOSE 164* 240* 182* 141* 141* 166* 186*  BUN 124* 142* 121* 90* 73* 61* 53*  CREATININE 4.91* 5.71*  4.91* 3.88* 3.38* 3.04* 2.83*  CALCIUM 10.2 10.1 10.0 9.8 10.4* 10.1 10.3  PHOS 6.3* 6.2* 4.9* 4.2 3.9 3.4 2.9   CBC Recent Labs  Lab 10/12/18 2013 10/13/18 0409 10/14/18 0421 10/15/18 0405  WBC 18.6* 16.0* 12.3* 13.1*  NEUTROABS  --   --  9.4* 9.9*  HGB 8.9* 8.6* 8.3* 8.5*  HCT 32.0* 31.1* 31.3* 31.8*  MCV 90.7 89.4 90.7 90.9  PLT 233 221 201 196    Medications:    . aspirin  81 mg Oral Daily  . chlorhexidine gluconate  (MEDLINE KIT)  15 mL Mouth Rinse BID  . Chlorhexidine Gluconate Cloth  6 each Topical Daily  . darbepoetin (ARANESP) injection - DIALYSIS  100 mcg Intravenous Q Sat-HD  . feeding supplement (PRO-STAT SUGAR FREE 64)  30 mL Per Tube TID  . feeding supplement (VITAL HIGH PROTEIN)  1,000 mL Per Tube Q24H  . heparin injection (subcutaneous)  5,000 Units Subcutaneous Q8H  . insulin aspart  1-3 Units Subcutaneous Q4H  . ipratropium-albuterol  3 mL Nebulization Q6H  . mouth rinse  15 mL Mouth Rinse 10 times per day  . pantoprazole sodium  40 mg Per Tube Daily  . rosuvastatin  10 mg Per Tube QPM  . sodium chloride flush  10-40 mL Intracatheter Q12H  . THROMBI-PAD  1 each Topical Once      Otelia Santee, MD 10/15/2018, 7:54 AM

## 2018-10-15 NOTE — Plan of Care (Signed)
  Problem: Clinical Measurements: Goal: Ability to maintain clinical measurements within normal limits will improve Outcome: Progressing   Problem: Nutrition: Goal: Adequate nutrition will be maintained Outcome: Progressing   Problem: Pain Managment: Goal: General experience of comfort will improve Outcome: Progressing   Problem: Cardiac: Goal: Ability to achieve and maintain adequate cardiopulmonary perfusion will improve Outcome: Progressing

## 2018-10-15 NOTE — Progress Notes (Signed)
Foley catheter removal discussed with Dr. Elsworth Soho - will remain in place for today. Reassess tomorrow.

## 2018-10-15 NOTE — Progress Notes (Addendum)
Pharmacy Antibiotic Note  Catherine Williams is a 57 y.o. female admitted on 10/07/2018 with witnessed arrest. Pharmacy now being consulted for cefepime dosing for possible HCAP.   Tmax 100.9, wbc 13, scr down slightly to 2.8 continues on CRRT.   New concern for HCAP given new fever, will start cefepime.   Plan: Cefepime 2g IV q12 hours while on CRRT  Height: 5\' 2"  (157.5 cm) Weight: 296 lb 8.3 oz (134.5 kg) IBW/kg (Calculated) : 50.1  Temp (24hrs), Avg:100.4 F (38 C), Min:99.3 F (37.4 C), Max:100.9 F (38.3 C)  Recent Labs  Lab 10/12/18 0417 10/12/18 2013 10/13/18 0409 10/13/18 1617 10/14/18 0421 10/14/18 1628 10/15/18 0405  WBC 20.5* 18.6* 16.0*  --  12.3*  --  13.1*  CREATININE 4.91* 5.71* 4.91* 3.88* 3.38* 3.04* 2.83*    Estimated Creatinine Clearance: 28.3 mL/min (A) (by C-G formula based on SCr of 2.83 mg/dL (H)).    Allergies  Allergen Reactions  . Omnipaque [Iohexol] Nausea And Vomiting    Contrast Dye- Effects Kidney's    Antimicrobials this admission: Cefepime 10/28>>   Microbiology results: 10/20 Blood cx: 2/4 NG 10/20 Sputum cx: normal resp flora 10/25 sputum -normal flora 10/28 urine - sent  Thank you for allowing pharmacy to be a part of this patient's care.  Erin Hearing PharmD., BCPS Clinical Pharmacist 10/15/2018 11:04 AM

## 2018-10-15 NOTE — Progress Notes (Signed)
NAME:  Catherine Williams, MRN:  [id number], DOB:  [birth year], LOS: 8 ADMISSION DATE:  10/07/2018, CONSULTATION DATE:  10/07/2018 REFERRING MD:  Dr. Benjamine Mola , CHIEF COMPLAINT:  Cardiac Arrest    Brief History   57 year old female presents to ED on 10/20 s/p Witnessed Arrest. Family reports that patient was of normal state of health, this afternoon she was sitting on couch with daughter when she was noted to be breathing "funny", when daughter tired to stimulate patient she was unresponsive at 1830. EMS arrived 1840 and patient was noted to be pulseless. ROSC achieved at 1900 after 2 shocks and CPR. On arrival to ED patient remained unresponsive. LA 1.38. K 4.2. WBC 10.3. BP 174/80. Cardiology consulted. PCCM asked to admit.   Past Medical History  Diastolic HF (EF 70-26, V7CH), COPD, PVD, Chronic Kidney Disease (Placement of Fistula on 10/17)  Significant Hospital Events   10/20 > Presents to ED s/p Cardiac Arrest  10/22 rewarmed at 1pm 10/24 - not awake enough to extubate but did follow commands  10/25 - sister from [place]. [place] at bedside. Daugther at bedside. Both very concerned she is not fully arousable. Per RN/family - can arouse to deep voice and track and nod and follow simple commands very briefly. Then falls asleep. Only 100cc./h urine so far. Per family - s/p LUE fistula recently in anticipation of future HD needs. Baseline creat 3.8s. Creat up at 4.82m%. Office renal doc -> Dr CMarval Regal    10/26 - 2 min PEA arrest with x  1 epi last night. Related to mucus plugging seen in ET tube via bronchoscopy. Sp BAL in RML Started on CRRT overnight -> This aM on CRRT, 40% fio2, PRVC and fentanyl and levophed gtt -> RASS -1 and followed simple commands. Seems weak 10/27 -  2L off with CRRT.  Off levophed  Consults: date of consult/date signed off & final recs:  Cardiology 10/20 PCCM 10/20  Procedures (surgical and bedside):  ETT 10/20 >> Left IJ 10/20 >>   Significant Diagnostic Tests:    CXR 10/20 > Heart size is mildly enlarged. Mild vascular congestion. No confluent opacities, effusions or pneumothorax. Suspect left lateral rib fractures, but difficult to visualize. CT Head 10/20 >>neg ECHO 10/21 >> nml LVEF, gr 2 DD EEG 10/21 >> slowing. No epileptiform activity.  Venous doppler legs 10/21 > no evidence DVT HD cath 10/25 - start CRRT  Micro Data:  Blood 10/20 >>ng Sputum 10/20 >>ng Urine 10/28 >> resp 10/25 >> ng   Antimicrobials:  Cefepime 10/28 >>   SUBJECTIVE/OVERNIGHT/INTERVAL HX   Critically ill, orally intubated, on CRRT. Low-grade fevers, minimal secretions Minimal urine output  Objective   Blood pressure (!) 158/53, pulse 87, temperature 100.2 F (37.9 C), resp. rate (!) 34, height _0  (1.575 m), weight 134.5 kg, SpO2 100 %. CVP:  [5 mmHg-12 mmHg] 8 mmHg  Vent Mode: PSV;CPAP FiO2 (%):  [40 %] 40 % Set Rate:  [24 bmp] 24 bmp Vt Set:  [380 mL] 380 mL PEEP:  [5 cmH20] 5 cmH20 Pressure Support:  [15 cmH20] 15 cmH20 Plateau Pressure:  [18 cmH20-22 cmH20] 21 cmH20   Intake/Output Summary (Last 24 hours) at 10/15/2018 1036 Last data filed at 10/15/2018 1000 Gross per 24 hour  Intake 1277.58 ml  Output 3618 ml  Net -2340.42 ml   Filed Weights   10/13/18 0500 10/14/18 0500 10/15/18 0500  Weight: (!) 140.8 kg (!) 139.9 kg 134.5 kg  Obese, critically ill, appears older than stated age, on CRRT Appears weak and deconditioned, follows commands although delayed response S1-S2 distant, no rub Decreased breath sounds bilateral 1+ edema No pallor no icterus Soft, obese abdomen  Chest x-ray personally reviewed which shows ET tube in position and right mid zone and left lower lobe infiltrate       Assessment & Plan:   Acute Hypercarbic Respiratory Failure suspected secondary to decompensated Heart Failure   H/O COPD, OSA on CPAP at HS   Plan  - VAP Bundle -Start spontaneous breathing trials  V.Fib/PEA Cardiac Arrest - elevated  troponin as expected. No evidence of DVT.  Decompensated Heart Failure  H/O Diastolic HF (EF 36-64, Q0HK) S/p repeat arrest 10/12/18 due to mucus plug -EKG with LVH and QTC 480  Plan  - Cardiac Monitoring  - Continue ASA, Crestor -Resume low-dose Coreg now that off pressors  Acute on Chronic Stage IV Kidney Disease  Baseline Crt high 3s. . Recent placement of UE Fistula .  Plan  -CRRT per renal, removing 100/hour Can transition to intermittent dialysis soon   Fever ? HCAP , also has dirty urine, cx neg - add cefepime empiric, MRSA pcr neg  Hypoglycemia -resolved H/O DM Plan  -SSI sensitive -Hold home Lantus  -PRN Dextrose   Acute Encephalopathy  -UDS negative -Appears intact, post hypothermia  Plan  - continue fent prn - RASS goal 0 to -1  Constipation -Continue tube feeds -Add lactulose and Colace  Disposition / Summary of Today's Plan 10/15/18   Minimize sedation, initiate spontaneous breathing trials with goal of extubation, continue CRRT with volume removal for 1 more day Add empiric antibiotics for H CAP and UTI      Code Status: FC Family Communication: Daughter at [place]   The patient is critically ill with multiple organ systems failure and requires high complexity decision making for assessment and support, frequent evaluation and titration of therapies, application of advanced monitoring technologies and extensive interpretation of multiple databases. Critical Care Time devoted to patient care services described in this note independent of APP/resident  time is 35 minutes.   Kara Mead MD. Shade Flood. [facility] Pager [phone] If no response call [phone]   10/15/2018

## 2018-10-15 NOTE — Progress Notes (Signed)
RN spoke with rounding MD's - Per Dr. Elsworth Soho (CCM) and Dr. Augustin Coupe (Nephrology) - foley catheter may be removed today. Residuals will be closely monitored.

## 2018-10-15 NOTE — Plan of Care (Signed)
  Problem: Clinical Measurements: Goal: Ability to maintain clinical measurements within normal limits will improve Outcome: Progressing Goal: Diagnostic test results will improve Outcome: Progressing Goal: Respiratory complications will improve Outcome: Progressing Goal: Cardiovascular complication will be avoided Outcome: Progressing   Problem: Nutrition: Goal: Adequate nutrition will be maintained Outcome: Progressing   Problem: Pain Managment: Goal: General experience of comfort will improve Outcome: Progressing

## 2018-10-16 ENCOUNTER — Inpatient Hospital Stay (HOSPITAL_COMMUNITY): Payer: Medicare Other

## 2018-10-16 DIAGNOSIS — L8990 Pressure ulcer of unspecified site, unspecified stage: Secondary | ICD-10-CM

## 2018-10-16 LAB — RENAL FUNCTION PANEL
ALBUMIN: 2.1 g/dL — AB (ref 3.5–5.0)
ALBUMIN: 2.3 g/dL — AB (ref 3.5–5.0)
ANION GAP: 9 (ref 5–15)
ANION GAP: 12 (ref 5–15)
BUN: 48 mg/dL — AB (ref 6–20)
BUN: 46 mg/dL — ABNORMAL HIGH (ref 6–20)
CALCIUM: 10.4 mg/dL — AB (ref 8.9–10.3)
CO2: 26 mmol/L (ref 22–32)
CO2: 23 mmol/L (ref 22–32)
CREATININE: 2.33 mg/dL — AB (ref 0.44–1.00)
Calcium: 10.4 mg/dL — ABNORMAL HIGH (ref 8.9–10.3)
Chloride: 102 mmol/L (ref 98–111)
Chloride: 102 mmol/L (ref 98–111)
Creatinine, Ser: 2.26 mg/dL — ABNORMAL HIGH (ref 0.44–1.00)
GFR calc Af Amer: 25 mL/min — ABNORMAL LOW (ref >60)
GFR calc non Af Amer: 23 mL/min — ABNORMAL LOW (ref >60)
GFR, EST AFRICAN AMERICAN: 26 mL/min — AB (ref >60)
GFR, EST NON AFRICAN AMERICAN: 22 mL/min — AB (ref >60)
GLUCOSE: 210 mg/dL — AB (ref 70–99)
GLUCOSE: 191 mg/dL — AB (ref 70–99)
PHOSPHORUS: 3.3 mg/dL (ref 2.5–4.6)
PHOSPHORUS: 2.8 mg/dL (ref 2.5–4.6)
POTASSIUM: 4.4 mmol/L (ref 3.5–5.1)
POTASSIUM: 4.4 mmol/L (ref 3.5–5.1)
Sodium: 137 mmol/L (ref 135–145)
Sodium: 137 mmol/L (ref 135–145)

## 2018-10-16 LAB — GLUCOSE, CAPILLARY
GLUCOSE-CAPILLARY: 190 mg/dL — AB (ref 70–99)
GLUCOSE-CAPILLARY: 148 mg/dL — AB (ref 70–99)
GLUCOSE-CAPILLARY: 181 mg/dL — AB (ref 70–99)
GLUCOSE-CAPILLARY: 177 mg/dL — AB (ref 70–99)
Glucose-Capillary: 196 mg/dL — ABNORMAL HIGH (ref 70–99)
Glucose-Capillary: 206 mg/dL — ABNORMAL HIGH (ref 70–99)
Glucose-Capillary: 165 mg/dL — ABNORMAL HIGH (ref 70–99)

## 2018-10-16 LAB — MAGNESIUM: Magnesium: 2.7 mg/dL — ABNORMAL HIGH (ref 1.7–2.4)

## 2018-10-16 LAB — POCT ACTIVATED CLOTTING TIME
ACTIVATED CLOTTING TIME: 208 s
ACTIVATED CLOTTING TIME: 241 s
Activated Clotting Time: 191 seconds
Activated Clotting Time: 202 seconds
Activated Clotting Time: 252 seconds

## 2018-10-16 LAB — CBC WITH DIFFERENTIAL/PLATELET
Abs Immature Granulocytes: 0.31 10*3/uL — ABNORMAL HIGH (ref 0.00–0.07)
Basophils Absolute: 0 10*3/uL (ref 0.0–0.1)
Basophils Relative: 0 %
EOS PCT: 2 %
Eosinophils Absolute: 0.2 10*3/uL (ref 0.0–0.5)
HEMATOCRIT: 33.7 % — AB (ref 36.0–46.0)
HEMOGLOBIN: 9 g/dL — AB (ref 12.0–15.0)
Immature Granulocytes: 2 %
LYMPHS ABS: 1.5 10*3/uL (ref 0.7–4.0)
LYMPHS PCT: 12 %
MCH: 24.7 pg — AB (ref 26.0–34.0)
MCHC: 26.7 g/dL — AB (ref 30.0–36.0)
MCV: 92.6 fL (ref 80.0–100.0)
MONO ABS: 0.4 10*3/uL (ref 0.1–1.0)
Monocytes Relative: 3 %
Neutro Abs: 10.3 10*3/uL — ABNORMAL HIGH (ref 1.7–7.7)
Neutrophils Relative %: 81 %
Platelets: 203 10*3/uL (ref 150–400)
RBC: 3.64 MIL/uL — ABNORMAL LOW (ref 3.87–5.11)
RDW: 15.7 % — ABNORMAL HIGH (ref 11.5–15.5)
WBC: 12.7 10*3/uL — ABNORMAL HIGH (ref 4.0–10.5)
nRBC: 0.5 % — ABNORMAL HIGH (ref 0.0–0.2)

## 2018-10-16 LAB — POCT I-STAT 3, ART BLOOD GAS (G3+)
Acid-Base Excess: 1 mmol/L (ref 0.0–2.0)
Bicarbonate: 28.1 mmol/L — ABNORMAL HIGH (ref 20.0–28.0)
O2 SAT: 95 %
PCO2 ART: 56.7 mmHg — AB (ref 32.0–48.0)
PO2 ART: 84 mmHg (ref 83.0–108.0)
Patient temperature: 36.5
TCO2: 30 mmol/L (ref 22–32)
pH, Arterial: 7.3 — ABNORMAL LOW (ref 7.350–7.450)

## 2018-10-16 LAB — APTT: aPTT: 167 seconds (ref 24–36)

## 2018-10-16 NOTE — Progress Notes (Signed)
[facility] Progress Note    Assessment/ Plan:   57 year old black female with numerous medical problems including advanced CKD-status post AV fistula. She is now status post cardiac arrest and has had acute on chronic renal failure  1.Renal-acute on advanced chronic renal failure after cardiac arrest.Initially, she was making good urine and kidney function was stable. Now, after blood pressure is controlled possibly has had some relative hypotension giving some ATN with worsening numbers. CRRT initiated10/25- running well - continue fornow- pulling 100 per hour and pressorsnow off-working possibly toward extubation. Hopefully can transition to IHD very soon  - No e/o renal recovery and she will be dialysis dependent now. - Plan on stopping the CRRT tomorrow AM @6AM  given filter just changed 12 hrs ago. Order has been placed and confirmed. Will then give trial of iHD on Thur vs Friday. Will eventually transition to a tunneled catheter  2. Hypertension/volume-is volume overloaded but now blood pressure is better. Shewas only on a very low dose of Coregnow stopped. UF 100 per hour- off pressors 3.Hyperkalemia- improved  4. Anemia--supportive care for now, dropping- latest iron stores good- startedESA  5.Status post arrest-I guess thinking due to OSA-not sure if more significant cardiac work-up is required- now has happened again  Subjective:   Not on pressors  Filter changed 12 hrs ago  Seen and tolerating CRRT 4K bath Pre/post/dialysate 500/300/1000 UF net 100 ml/hr   Objective:   BP (!) 131/49   Pulse 85   Temp 98.6 F (37 C) (Esophageal)   Resp (!) 31   Ht 5' 2"  (1.575 m)   Wt 132.9 kg   SpO2 99%   BMI 53.59 kg/m   Intake/Output Summary (Last 24 hours) at 10/16/2018 0853 Last data filed at 10/16/2018 0800 Gross per 24 hour  Intake 2161.18 ml  Output 3349 ml  Net -1187.82 ml   Weight change: -1.6 kg  Physical Exam: General:  sedated, intubated- obese Heart: RRR Lungs: dec BS at bases Abdomen: obese, soft non tender Extremities: pitting edema improved Dialysis Access: right IJ vascath placed 10/25- also left upper arm AVF- patentw/ good bruit   Imaging: No results found.  Labs: BMET Recent Labs  Lab 10/13/18 0409 10/13/18 1617 10/14/18 0421 10/14/18 1628 10/15/18 0405 10/15/18 1509 10/16/18 0519  NA 144 141 140 140 138 139 137  K 4.6 4.6 4.5 4.5 4.4 4.6 4.4  CL 106 108 102 104 104 103 102  CO2 26 28 27 26 25 26 26   GLUCOSE 182* 141* 141* 166* 186* 199* 210*  BUN 121* 90* 73* 61* 53* 51* 48*  CREATININE 4.91* 3.88* 3.38* 3.04* 2.83* 2.65* 2.33*  CALCIUM 10.0 9.8 10.4* 10.1 10.3 10.4* 10.4*  PHOS 4.9* 4.2 3.9 3.4 2.9 3.2 3.3   CBC Recent Labs  Lab 10/13/18 0409 10/14/18 0421 10/15/18 0405 10/16/18 0519  WBC 16.0* 12.3* 13.1* 12.7*  NEUTROABS  --  9.4* 9.9* 10.3*  HGB 8.6* 8.3* 8.5* 9.0*  HCT 31.1* 31.3* 31.8* 33.7*  MCV 89.4 90.7 90.9 92.6  PLT 221 201 196 203    Medications:    . aspirin  81 mg Oral Daily  . carvedilol  3.125 mg Oral BID WC  . chlorhexidine gluconate (MEDLINE KIT)  15 mL Mouth Rinse BID  . Chlorhexidine Gluconate Cloth  6 each Topical Daily  . darbepoetin (ARANESP) injection - DIALYSIS  100 mcg Intravenous Q Sat-HD  . docusate  100 mg Per Tube BID  . feeding supplement (PRO-STAT SUGAR FREE  64)  30 mL Per Tube TID  . feeding supplement (VITAL HIGH PROTEIN)  1,000 mL Per Tube Q24H  . heparin injection (subcutaneous)  5,000 Units Subcutaneous Q8H  . insulin aspart  1-3 Units Subcutaneous Q4H  . ipratropium-albuterol  3 mL Nebulization Q6H  . mouth rinse  15 mL Mouth Rinse 10 times per day  . pantoprazole sodium  40 mg Per Tube Daily  . rosuvastatin  10 mg Per Tube QPM  . sodium chloride flush  10-40 mL Intracatheter Q12H      Otelia Santee, MD 10/16/2018, 8:53 AM

## 2018-10-16 NOTE — Progress Notes (Signed)
RN received in report to d/c CRRT at 0700 this AM. No order was placed to stop treatment this AM. Dr. Aron Baba note said it would be stopped this morning he did not specify a time or if he wanted to round on the patient and check labs first.   RN Paged on call Nephrology, page returned by Pearson Grippe, MD. He said to leave the machine running until Dr. Augustin Coupe rounds on patient since the filter was changed 10/15/18 at 1700,as long as the filter is not having any issues.  Will relay to 1st shift RN.    Charlsie Quest

## 2018-10-16 NOTE — Consult Note (Signed)
[facility] Nurse wound consult note Reason for Consult: Moisture associated skin damage from adhesive around PIV catheter. Blistering to inframammary region with partial thickness epithelial peeling.  Right wrist IV site with partial thickness blistering and peeling epithelial peeling.  Wound type: blistering.  Generalized anasarca present Pressure Injury POA: NA Measurement: Inframammary:  2 cm x 4 cm x 0.1 cm Right neck:  0.5 cm diameter Right wrist:  0.3 cm x 0.5 cm x 0.1 cm  Wound AQT:MAUQ and moist Drainage (amount, consistency, odor) scant weeping Periwound:edema, adhesive from PIV dressing  Dressing procedure/placement/frequency:Cleanse wounds to central chest, right neck and right wrist wounds with soap and water.  Apply silicone foam dressing.  Change every three days and PRN soilage.  Will not follow at this time.  Please re-consult if needed.  Domenic Moras MSN, RN, FNP-BC CWON Wound, Ostomy, Continence Nurse Pager [phone]

## 2018-10-16 NOTE — Progress Notes (Signed)
57 year old obese woman with OSA and CKD stage V admitted 10/20 after PEA cardiac arrest.  Underwent hypothermia protocol and sustained another brief PEA arrest on 10/26 related to mucous plugging She remains critically ill, sedated on propofol and on CRRT.  She tolerated pressure support weaning 15/5 on 10/28.  On exam-obese, on low-dose propofol, opens eyes and flickers toes but very weak and unable to move arms on command, decreased breath sounds bilateral, S1-S2 distant, no rub or S3, 1+ edema.  Chest x-ray reviewed which shows clearing of bibasilar infiltrates. Urine culture showing gram-negative rods  Labs show decreasing creatinine with CRRT, stable leukocytosis and mild anemia. ABG shows mild acute respiratory acidosis.  Impression/plan Cardiac arrest of unclear cause, ischemia felt to be unlikely, second episode was felt to be due to mucous plugging.  Acute respiratory failure-proceed with spontaneous breathing trials, making slow progress, hope to improve with fluid removal and infiltrates have cleared.  AKI on CKD 5-expect that she has progressed to ESRD, has upper extremity fistula, CRRT to stop by tomorrow and will then transition to intermittent dialysis, she will need tunneled catheter eventually.  Fever/H CAP/UTI -continue empiric cefepime while awaiting urine cultures.  Acute encephalopathy-discontinue propofol and use fentanyl intermittent, can use Precedex if needed.   appears to be neurologically intact but expect severe deconditioning.  Daughter updated at bedside  The patient is critically ill with multiple organ systems failure and requires high complexity decision making for assessment and support, frequent evaluation and titration of therapies, application of advanced monitoring technologies and extensive interpretation of multiple databases. Critical Care Time devoted to patient care services described in this note independent of APP/resident  time is 32 minutes.    Kara Mead MD. Shade Flood. [facility] Pager [phone] If no response call [phone]   10/16/2018

## 2018-10-17 ENCOUNTER — Inpatient Hospital Stay (HOSPITAL_COMMUNITY): Payer: Medicare Other

## 2018-10-17 ENCOUNTER — Other Ambulatory Visit: Payer: Self-pay

## 2018-10-17 LAB — RENAL FUNCTION PANEL
ALBUMIN: 2.3 g/dL — AB (ref 3.5–5.0)
ALBUMIN: 2.1 g/dL — AB (ref 3.5–5.0)
Albumin: 2.3 g/dL — ABNORMAL LOW (ref 3.5–5.0)
Anion gap: 13 (ref 5–15)
Anion gap: 13 (ref 5–15)
Anion gap: 15 (ref 5–15)
BUN: 44 mg/dL — AB (ref 6–20)
BUN: 50 mg/dL — AB (ref 6–20)
BUN: 65 mg/dL — ABNORMAL HIGH (ref 6–20)
CALCIUM: 10.6 mg/dL — AB (ref 8.9–10.3)
CALCIUM: 10.9 mg/dL — AB (ref 8.9–10.3)
CHLORIDE: 101 mmol/L (ref 98–111)
CO2: 24 mmol/L (ref 22–32)
CO2: 24 mmol/L (ref 22–32)
CO2: 23 mmol/L (ref 22–32)
CREATININE: 2.37 mg/dL — AB (ref 0.44–1.00)
CREATININE: 3.42 mg/dL — AB (ref 0.44–1.00)
Calcium: 10.7 mg/dL — ABNORMAL HIGH (ref 8.9–10.3)
Chloride: 100 mmol/L (ref 98–111)
Chloride: 98 mmol/L (ref 98–111)
Creatinine, Ser: 2.69 mg/dL — ABNORMAL HIGH (ref 0.44–1.00)
GFR calc Af Amer: 25 mL/min — ABNORMAL LOW (ref >60)
GFR calc Af Amer: 21 mL/min — ABNORMAL LOW (ref >60)
GFR calc Af Amer: 16 mL/min — ABNORMAL LOW (ref >60)
GFR calc non Af Amer: 18 mL/min — ABNORMAL LOW (ref >60)
GFR calc non Af Amer: 14 mL/min — ABNORMAL LOW (ref >60)
GFR, EST NON AFRICAN AMERICAN: 21 mL/min — AB (ref >60)
GLUCOSE: 232 mg/dL — AB (ref 70–99)
GLUCOSE: 245 mg/dL — AB (ref 70–99)
GLUCOSE: 303 mg/dL — AB (ref 70–99)
PHOSPHORUS: 3.1 mg/dL (ref 2.5–4.6)
POTASSIUM: 4.4 mmol/L (ref 3.5–5.1)
POTASSIUM: 4.2 mmol/L (ref 3.5–5.1)
Phosphorus: 3.5 mg/dL (ref 2.5–4.6)
Phosphorus: 3.8 mg/dL (ref 2.5–4.6)
Potassium: 4.1 mmol/L (ref 3.5–5.1)
SODIUM: 137 mmol/L (ref 135–145)
SODIUM: 136 mmol/L (ref 135–145)
Sodium: 138 mmol/L (ref 135–145)

## 2018-10-17 LAB — URINE CULTURE: Culture: >=100000 — AB

## 2018-10-17 LAB — POCT I-STAT 3, ART BLOOD GAS (G3+)
ACID-BASE EXCESS: 2 mmol/L (ref 0.0–2.0)
Acid-Base Excess: 1 mmol/L (ref 0.0–2.0)
BICARBONATE: 28.6 mmol/L — AB (ref 20.0–28.0)
Bicarbonate: 28.2 mmol/L — ABNORMAL HIGH (ref 20.0–28.0)
O2 SAT: 98 %
O2 Saturation: 92 %
PCO2 ART: 58.7 mmHg — AB (ref 32.0–48.0)
PCO2 ART: 57.2 mmHg — AB (ref 32.0–48.0)
PH ART: 7.291 — AB (ref 7.350–7.450)
PO2 ART: 115 mmHg — AB (ref 83.0–108.0)
Patient temperature: 37.9
TCO2: 30 mmol/L (ref 22–32)
TCO2: 30 mmol/L (ref 22–32)
pH, Arterial: 7.312 — ABNORMAL LOW (ref 7.350–7.450)
pO2, Arterial: 74 mmHg — ABNORMAL LOW (ref 83.0–108.0)

## 2018-10-17 LAB — POCT ACTIVATED CLOTTING TIME
ACTIVATED CLOTTING TIME: 208 s
Activated Clotting Time: 224 seconds
Activated Clotting Time: 219 seconds
Activated Clotting Time: 213 seconds
Activated Clotting Time: 202 seconds

## 2018-10-17 LAB — CBC WITH DIFFERENTIAL/PLATELET
Abs Immature Granulocytes: 0.95 10*3/uL — ABNORMAL HIGH (ref 0.00–0.07)
BASOS ABS: 0.1 10*3/uL (ref 0.0–0.1)
Basophils Relative: 1 %
Eosinophils Absolute: 0.3 10*3/uL (ref 0.0–0.5)
Eosinophils Relative: 1 %
HEMATOCRIT: 35.2 % — AB (ref 36.0–46.0)
HEMOGLOBIN: 9.4 g/dL — AB (ref 12.0–15.0)
IMMATURE GRANULOCYTES: 5 %
LYMPHS ABS: 2.4 10*3/uL (ref 0.7–4.0)
LYMPHS PCT: 12 %
MCH: 24.7 pg — ABNORMAL LOW (ref 26.0–34.0)
MCHC: 26.7 g/dL — ABNORMAL LOW (ref 30.0–36.0)
MCV: 92.4 fL (ref 80.0–100.0)
MONOS PCT: 8 %
Monocytes Absolute: 1.5 10*3/uL — ABNORMAL HIGH (ref 0.1–1.0)
NEUTROS PCT: 73 %
NRBC: 1.1 % — AB (ref 0.0–0.2)
Neutro Abs: 14.3 10*3/uL — ABNORMAL HIGH (ref 1.7–7.7)
Platelets: 214 10*3/uL (ref 150–400)
RBC: 3.81 MIL/uL — ABNORMAL LOW (ref 3.87–5.11)
RDW: 15.6 % — ABNORMAL HIGH (ref 11.5–15.5)
WBC: 19.5 10*3/uL — ABNORMAL HIGH (ref 4.0–10.5)

## 2018-10-17 LAB — MAGNESIUM
Magnesium: 2.6 mg/dL — ABNORMAL HIGH (ref 1.7–2.4)
Magnesium: 2.7 mg/dL — ABNORMAL HIGH (ref 1.7–2.4)

## 2018-10-17 LAB — GLUCOSE, CAPILLARY
GLUCOSE-CAPILLARY: 177 mg/dL — AB (ref 70–99)
GLUCOSE-CAPILLARY: 221 mg/dL — AB (ref 70–99)
GLUCOSE-CAPILLARY: 226 mg/dL — AB (ref 70–99)
GLUCOSE-CAPILLARY: 254 mg/dL — AB (ref 70–99)
Glucose-Capillary: 265 mg/dL — ABNORMAL HIGH (ref 70–99)
Glucose-Capillary: 233 mg/dL — ABNORMAL HIGH (ref 70–99)

## 2018-10-17 LAB — TROPONIN I: TROPONIN I: 0.03 ng/mL — AB (ref <0.03)

## 2018-10-17 LAB — APTT: aPTT: 137 seconds — ABNORMAL HIGH (ref 24–36)

## 2018-10-17 LAB — GLUCOSE, RANDOM: GLUCOSE: 240 mg/dL — AB (ref 70–99)

## 2018-10-17 LAB — LACTIC ACID, PLASMA: Lactic Acid, Venous: 1.2 mmol/L (ref 0.5–1.9)

## 2018-10-17 MED ORDER — INSULIN ASPART 100 UNIT/ML ~~LOC~~ SOLN
2.0000 [IU] | SUBCUTANEOUS | Status: DC
  Administered 2018-10-17 – 2018-10-18 (×5): 6 [IU] via SUBCUTANEOUS

## 2018-10-17 MED ORDER — INSULIN GLARGINE 100 UNIT/ML ~~LOC~~ SOLN
5.0000 [IU] | Freq: Two times a day (BID) | SUBCUTANEOUS | Status: DC
  Filled 2018-10-17: qty 0.05

## 2018-10-17 MED ORDER — ADULT MULTIVITAMIN LIQUID CH
15.0000 mL | Freq: Every day | ORAL | Status: DC
  Administered 2018-10-17 – 2018-11-01 (×16): 15 mL
  Filled 2018-10-17 (×21): qty 15

## 2018-10-17 MED ORDER — SODIUM CHLORIDE 0.9 % IV SOLN
2.0000 g | Freq: Two times a day (BID) | INTRAVENOUS | Status: DC
  Administered 2018-10-18: 2 g via INTRAVENOUS
  Filled 2018-10-17: qty 2

## 2018-10-17 MED ORDER — NOREPINEPHRINE 16 MG/250ML-% IV SOLN
0.0000 ug/min | INTRAVENOUS | Status: DC
  Administered 2018-10-17: 24 ug/min via INTRAVENOUS
  Administered 2018-10-18: 6 ug/min via INTRAVENOUS
  Administered 2018-10-20: 10 ug/min via INTRAVENOUS
  Administered 2018-10-23: 2 ug/min via INTRAVENOUS
  Filled 2018-10-17 (×4): qty 250

## 2018-10-17 MED ORDER — NOREPINEPHRINE 4 MG/250ML-% IV SOLN
0.0000 ug/min | INTRAVENOUS | Status: DC
  Administered 2018-10-17: 8 ug/min via INTRAVENOUS
  Filled 2018-10-17: qty 250

## 2018-10-17 MED ORDER — INSULIN GLARGINE 100 UNIT/ML ~~LOC~~ SOLN
10.0000 [IU] | Freq: Every day | SUBCUTANEOUS | Status: DC
  Administered 2018-10-17 – 2018-10-18 (×2): 10 [IU] via SUBCUTANEOUS
  Filled 2018-10-17 (×3): qty 0.1

## 2018-10-17 NOTE — Progress Notes (Signed)
[facility] Progress Note    Assessment/ Plan:   57 year old black female with numerous medical problems including advanced CKD-status post AV fistula. She is now status post cardiac arrest and has had acute on chronic renal failure  1.Renal-acute on advanced chronic renal failure after cardiac arrest.Initially, she was making good urine and kidney function was stable. Now, after blood pressure is controlled possibly has had some relative hypotension giving some ATN with worsening numbers. CRRT initiated10/25- running well - continue fornow- pulling 100 per hour and pressorsnow off-working possibly toward extubation. Hopefully can transition to IHD very soon - No e/o renal recovery and she will be dialysis dependent now. - Had planned on giving a trial of iHD on Thur vs Friday but in like of the overnight events will plan on restarting CRRT Thursday AM unless she comes off Levophed with a stable and adequate BP. Currently MAP 60-65.   - Will eventually transition to a tunneled catheter; will ask VIR to see pt given she's not on CRRT.  2. Hypertension/volume-is volume overloaded but now blood pressure is better. Shewas only on a very low dose of Coregnow stopped. UF 100 per hour- off pressors 3.Hyperkalemia- improved  4. Anemia--supportive care for now, dropping- latest iron stores good- startedESA  5.Status post arrest-I guess thinking due to OSA-not sure if more significant cardiac work-up is required- now has happened again  Subjective:   Pt had PEA arrest this AM with desat + thick and bloody sputum -> Epi and CPR with ROSC achieved within 7 minutes. Levophed was started as well.   Objective:   BP (!) 156/17   Pulse 91   Temp (!) 100.6 F (38.1 C)   Resp (!) 24   Ht 5' 2" (1.575 m)   Wt 130.3 kg   SpO2 100%   BMI 52.54 kg/m   Intake/Output Summary (Last 24 hours) at 10/17/2018 0854 Last data filed at 10/17/2018 0800 Gross per 24 hour   Intake 1512.84 ml  Output 3703 ml  Net -2190.16 ml   Weight change: -2.6 kg  Physical Exam: General: sedated, intubated- obese Heart: RRR Lungs: dec BS at bases Abdomen: obese, soft non tender Extremities: pitting edema improved Dialysis Access: right IJ vascath placed 10/25- also left upper arm AVF- patentw/ good bruit  Imaging: Dg Chest Port 1 View  Result Date: 10/17/2018 CLINICAL DATA:  Hypoxia EXAM: PORTABLE CHEST 1 VIEW COMPARISON:  October 16, 2018 FINDINGS: Endotracheal tube tip is 4.3 cm above the carina. Nasogastric tube tip and side port below the diaphragm. Right jugular catheter tip is in the superior vena cava. Left jugular catheter tip is at the junction of the left innominate vein and superior vena cava. No pneumothorax. There is mild bibasilar atelectasis. Lungs elsewhere are clear. The heart is upper normal in size with pulmonary vascularity normal. No adenopathy. No bone lesions. IMPRESSION: Tube and catheter positions as described without pneumothorax. Mild bibasilar atelectasis. No consolidation. Stable cardiac silhouette. Electronically Signed   By: Lowella Grip III M.D.   On: 10/17/2018 07:10   Dg Chest Port 1 View  Result Date: 10/16/2018 CLINICAL DATA:  Acute respiratory failure EXAM: PORTABLE CHEST 1 VIEW COMPARISON:  10/13/2018 FINDINGS: Endotracheal tube in good position and unchanged. Right jugular central venous catheter tip in the SVC. Left jugular central venous catheter tip in the SVC unchanged. NG tube enters the stomach with the tip not visualized. Mild bibasilar airspace disease has improved in the interval. Negative for edema or effusion. IMPRESSION:  Support lines remain in good position. Bibasilar airspace disease with interval improvement. Electronically Signed   By: Franchot Gallo M.D.   On: 10/16/2018 09:40    Labs: BMET Recent Labs  Lab 10/14/18 1628 10/15/18 0405 10/15/18 1509 10/16/18 0519 10/16/18 1621 10/17/18 0414  10/17/18 0728  NA 140 138 139 137 137 138 137  K 4.5 4.4 4.6 4.4 4.4 4.4 4.2  CL 104 104 103 102 102 101 100  CO2 _0 GLUCOSE 166* 186* 199* 210* 191* 232* 245*  BUN 61* 53* 51* 48* 46* 44* 50*  CREATININE 3.04* 2.83* 2.65* 2.33* 2.26* 2.37* 2.69*  CALCIUM 10.1 10.3 10.4* 10.4* 10.4* 10.7* 10.6*  PHOS 3.4 2.9 3.2 3.3 2.8 3.5 3.8   CBC Recent Labs  Lab 10/14/18 0421 10/15/18 0405 10/16/18 0519 10/17/18 0414  WBC 12.3* 13.1* 12.7* 19.5*  NEUTROABS 9.4* 9.9* 10.3* 14.3*  HGB 8.3* 8.5* 9.0* 9.4*  HCT 31.3* 31.8* 33.7* 35.2*  MCV 90.7 90.9 92.6 92.4  PLT 201 196 203 214    Medications:    . aspirin  81 mg Oral Daily  . carvedilol  3.125 mg Oral BID WC  . chlorhexidine gluconate (MEDLINE KIT)  15 mL Mouth Rinse BID  . Chlorhexidine Gluconate Cloth  6 each Topical Daily  . darbepoetin (ARANESP) injection - DIALYSIS  100 mcg Intravenous Q Sat-HD  . docusate  100 mg Per Tube BID  . feeding supplement (PRO-STAT SUGAR FREE 64)  30 mL Per Tube TID  . feeding supplement (VITAL HIGH PROTEIN)  1,000 mL Per Tube Q24H  . heparin injection (subcutaneous)  5,000 Units Subcutaneous Q8H  . insulin aspart  1-3 Units Subcutaneous Q4H  . ipratropium-albuterol  3 mL Nebulization Q6H  . mouth rinse  15 mL Mouth Rinse 10 times per day  . pantoprazole sodium  40 mg Per Tube Daily  . rosuvastatin  10 mg Per Tube QPM  . sodium chloride flush  10-40 mL Intracatheter Q12H      Otelia Santee, MD 10/17/2018, 8:54 AM

## 2018-10-17 NOTE — Progress Notes (Signed)
PCCM INTERVAL PROGRESS NOTE   Called to bedside in the setting of cardiac arrest. She was admitted for this, then had additional arrest on 10/26 for what was felt to be mucous plugging. This morning she began to rapidly desaturate and suffered cardiac arrest. RT reports she was very difficult to bag until she was lavaged. Then they were able to suction out some thick "plug-like" secretions. She became easy to bag. ROSC was achieved. Total downtime estimated at about one minute.    Plan: Labs, CXR pending  Georgann Housekeeper, AGACNP-BC [place] Pager [phone] or [phone]  10/17/2018 6:54 AM

## 2018-10-17 NOTE — Code Documentation (Signed)
CODE BLUE NOTE  Patient Name: Catherine Williams   MRN: [id number]   Date of Birth/ Sex: 07-28-1961 , female      Admission Date: 10/07/2018  Attending Provider: Brand Males, MD  Primary Diagnosis: PEA- admitted 10/20 for PEA cardiac arrest requiring shock x2.    Indication: Pt was stable until this AM, when she was noted to be coughing, vomited and bradycardic down to asystole. Code blue was subsequently called. At the time of arrival on scene, ACLS protocol was underway. Patient received about 1 minute of chest compressions with 1 dose epi. Patient had SVT at [phone].  Technical Description:  - CPR performance duration:  1 minute  - Was defibrillation or cardioversion used? No   - Was external pacer placed? No  - Was patient intubated pre/post CPR? Yes- she was already intubated prior    Medications Administered: Y = Yes; Blank = No Amiodarone    Atropine    Calcium    Epinephrine  1  Lidocaine    Magnesium    Norepinephrine    Phenylephrine    Sodium bicarbonate    Vasopressin      Post CPR evaluation:  - Final Status - Was patient successfully resuscitated ? Yes - What is current rhythm? SVT - What is current hemodynamic status? Stable but critical   Miscellaneous Information:  - Labs sent, including: Renal panel, magnesium, troponins, lactic acid, glucose  - Primary team notified?  Yes  - Family Notified? Yes  - Additional notes/ transfer status: Patient was not transferred from [place], [place], DO  10/17/2018, 6:40 AM

## 2018-10-17 NOTE — Progress Notes (Addendum)
Nutrition Follow Up  DOCUMENTATION CODES:   Morbid obesity  INTERVENTION:    Vital HP at goal rate of 40 ml/h (960 ml per day) and Prostat 30 ml TID  Provides 1260 kcals, 129 gm protein, 802 ml free water daily   Add liquid MVI daily to meet RDIs  NUTRITION DIAGNOSIS:   Inadequate oral intake related to inability to eat as evidenced by NPO status, ongoing  GOAL:   Provide needs based on ASPEN/SCCM guidelines, met  MONITOR:   TF tolerance, Vent status, Labs, Skin, Weight trends, I & O's  ASSESSMENT:   57 yo [place] who presented to ED s/p witnessed arrest. Upon EMS's arrival patient was noted to be pulseless. ROSC achieved at 1900 after 2 shocks and CPR. On arrival to ED patient remained unresponsive.  10/30 s/p bronchoscopy   Patient is currently intubated on ventilator support Temp (24hrs), Avg:100 F (37.8 C), Min:99.1 F (37.3 C), Max:101.3 F (38.5 C)  Pt sustained another brief PEA arrest this AM. Spoke with Judson Roch, Therapist, sports. Pt vomited during code.  TF (Vital High Protein) restarted this AM via OGT.  CVVHD initiated 10/26. Likely needs long term HD. Cardiology note reviewed. Consider ischemia evaluation. Labs & medications reviewed. Mg 2.7 (H).  CBG's [phone].  Diet Order:   Diet Order    None     EDUCATION NEEDS:   No education needs have been identified at this time  Skin:  Skin Assessment: Skin Integrity Issues: Skin Integrity Issues:: Stage II Stage II: R arm & neck  Last BM:  10/30   Intake/Output Summary (Last 24 hours) at 10/17/2018 1117 Last data filed at 10/17/2018 1100 Gross per 24 hour  Intake 1496.52 ml  Output 3230 ml  Net -1733.48 ml   Height:   Ht Readings from Last 1 Encounters:  10/07/18 5' 2"  (1.575 m)   Weight:   Wt Readings from Last 1 Encounters:  10/17/18 130.3 kg   Ideal Body Weight:  50 kg  BMI:  Body mass index is 52.54 kg/m.  Estimated Nutritional Needs:   Kcal:  1100-1250  Protein:  >/= 125  gm  Fluid:  per MD  Arthur Holms, RD, LDN Pager #: [phone] After-Hours Pager #: [phone]

## 2018-10-17 NOTE — Progress Notes (Addendum)
NAME:  Catherine Williams, MRN:  [id number], DOB:  29-Mar-1961, LOS: 71 ADMISSION DATE:  10/07/2018, CONSULTATION DATE:  10/07/2018 REFERRING MD:  Dr. Benjamine Mola , CHIEF COMPLAINT:  Cardiac Arrest    Brief History   57 year old female presents to ED on 10/20 s/p Witnessed Arrest. Family reports that patient was of normal state of health, this afternoon she was sitting on couch with daughter when she was noted to be breathing "funny", when daughter tired to stimulate patient she was unresponsive at 1830. EMS arrived 1840 and patient was noted to be pulseless. ROSC achieved at 1900 after 2 shocks and CPR. On arrival to ED patient remained unresponsive. LA 1.38. K 4.2. WBC 10.3. BP 174/80. Cardiology consulted. PCCM asked to admit.   Past Medical History  Diastolic HF (EF 67-12, W5YK), COPD, PVD, Chronic Kidney Disease (Placement of Fistula on 10/17)  Significant Hospital Events   10/20 > Presents to ED s/p Cardiac Arrest  10/22 rewarmed at 1pm 10/24 - not awake enough to extubate but did follow commands  10/25 - sister from [place]. [place] at bedside. Daugther at bedside. Both very concerned she is not fully arousable. Per RN/family - can arouse to deep voice and track and nod and follow simple commands very briefly. Then falls asleep. Only 100cc./h urine so far. Per family - s/p LUE fistula recently in anticipation of future HD needs. Baseline creat 3.8s. Creat up at 4.84m%. Office renal doc -> Dr CMarval Regal    10/26 - 2 min PEA arrest with x  1 epi last night. Related to mucus plugging seen in ET tube via bronchoscopy. Sp BAL in RML Started on CRRT overnight -> This aM on CRRT, 40% fio2, PRVC and fentanyl and levophed gtt -> RASS -1 and followed simple commands. Seems weak 10/27 -  2L off with CRRT.  Off levophed 10/17/2018 status post second PEA arrest for most likely pulmonary  Consults: date of consult/date signed off & final recs:  Cardiology 10/20 reconsult on 10/17/2018 PCCM 10/20  Procedures  (surgical and bedside):  ETT 10/20 >> Left IJ 10/20 >>  Right IJ hemodialysis catheter 10/12/2018>> Significant Diagnostic Tests:  CXR 10/20 > Heart size is mildly enlarged. Mild vascular congestion. No confluent opacities, effusions or pneumothorax. Suspect left lateral rib fractures, but difficult to visualize. CT Head 10/20 >>neg ECHO 10/21 >> nml LVEF, gr 2 DD EEG 10/21 >> slowing. No epileptiform activity.  Venous doppler legs 10/21 > no evidence DVT HD cath 10/25 - start CRRT  Micro Data:  Blood 10/20 >>ng Sputum 10/20 >>ng Urine 10/28 >> 100,000 colonies of Serratia marcescens>> resp 10/25 >> ng   Antimicrobials:  Cefepime 10/28 >>   SUBJECTIVE/OVERNIGHT/INTERVAL HX  Status post second PEA arrest from pulmonary origin Fortunately she is neurologically intact She was to go to intermittent hemodialysis but will require CRRT due to hypotension  Objective   Blood pressure (!) 156/17, pulse 91, temperature (!) 100.6 F (38.1 C), resp. rate (!) 24, height _0  (1.575 m), weight 130.3 kg, SpO2 100 %. CVP:  [5 mmHg-39 mmHg] 10 mmHg  Vent Mode: PRVC FiO2 (%):  [40 %] 40 % Set Rate:  [20 bmp] 20 bmp Vt Set:  [380 mL] 380 mL PEEP:  [5 cmH20] 5 cmH20 Pressure Support:  [15 cmH20] 15 cmH20 Plateau Pressure:  [18 cmH20-25 cmH20] 18 cmH20   Intake/Output Summary (Last 24 hours) at 10/17/2018 0851 Last data filed at 10/17/2018 0800 Gross per 24 hour  Intake 1512.84  ml  Output 3703 ml  Net -2190.16 ml   Filed Weights   10/15/18 0500 10/16/18 0500 10/17/18 0600  Weight: 134.5 kg 132.9 kg 130.3 kg     General: Obese female who is awake and follows commands.  She is had intermittent fentanyl for discomfort. HEENT: Endotracheal tube in place, pupils equal reactive light Neuro: Pulse commands moves all extremities CV: Sounds regular regular rate and rhythm PULM: even/non-labored, lungs bilaterally rhonchi ID:[id number], non-tender, bsx4 active  Extremities: warm/dry, plus edema   Skin: no rashes or lesions     10/17/2018 chest x-ray reviewed postarrest: Endotracheal tube adequately placed, right IJ hemodialysis catheter left IJ central line in place.  Left effusion noted.  Otherwise unremarkable chest x-ray.  Personally reviewed by me.     Assessment & Plan:   Acute Hypercarbic Respiratory Failure suspected secondary to decompensated Heart Failure   H/O COPD, OSA on CPAP at HS  Suspected PAS secondary to blood clots and endotracheal tube on 10/17/2018 Hypotension post PEA arrest  -Currently day 10 of intubation with 2 PEA arrest most likely secondary to pulmonary origin.  Consideration should be given to tracheostomy -Continue ventilatory support -No weaning today -Pulmonary hygiene -Arterial blood gases and checks x-ray in a.m. -Vasopressor support as needed  V.Fib/PEA Cardiac Arrest - elevated troponin as expected. No evidence of DVT.  Decompensated Heart Failure  H/O Diastolic HF (EF 31-43, O8IL) S/p repeat arrest 10/12/18 due to mucus plug 10/17/2018 status post PEA arrest most likely from a pulmonary source -Check troponin -Vasopressor support -Cardiology consult called 10/17/2018 -Check troponins -Monitor telemetry -Status post fiberoptic bronchoscopy 10/17/2018 without any acute finding  Acute on Chronic Stage IV Kidney Disease  Baseline Crt high 3s. . Recent placement of UE Fistula . -Continue CRRT plan was to go to intermittent hemodialysis but she is status post cardiac arrest on 10/17/2018 and required vasopressor support   Fever ? HCAP , also has dirty urine, cx neg - add cefepime empiric, MRSA pcr neg  Hypoglycemia -resolved H/O DM CBG (last 3)  Recent Labs    10/17/18 0013 10/17/18 0411 10/17/18 0817  GLUCAP 177* 221* 226*    -10/17/2018 add low-dose Lantus insulin -Sliding scale insulin per protocol  Acute Encephalopathy  -Alert and orientated post PEA arrest on 10/17/2018  Constipation -Tube feeds -Laxative as  needed  Disposition / Summary of Today's Plan 10/17/18   10/17/2008 status post secondary PEA arrest most likely from blockage of endotracheal tube with blood clot.  She was to go to intermittent hemodialysis she will probably remain on CVVH at this time. Cardiology has been consulted since this is her second and possibly third PEA arrest. Status post fiberoptic bronchoscopy 10/17/2018 with no acute findings per Dr. Elsworth Soho       Code Status: FC Family Communication: 10/17/2018 daughter updated at bedside per NP and MD    App cct 45 min   Richardson Landry Hamad Whyte ACNP Maryanna Shape PCCM Pager [phone] till 1 pm If no answer page [phone] 10/17/2018, 8:52 AM

## 2018-10-17 NOTE — Procedures (Addendum)
Bronchoscopy Procedure Note Catherine Williams [id number] 1961-08-19  Procedure: Bronchoscopy Indications: Diagnostic evaluation of the airways and Remove secretions  Procedure Details Consent: Risks of procedure as well as the alternatives and risks of each were explained to the (patient/caregiver).  Consent for procedure obtained. Time Out: Verified patient identification, verified procedure, site/side was marked, verified correct patient position, special equipment/implants available, medications/allergies/relevent history reviewed, required imaging and test results available.  Performed  In preparation for procedure, patient was given 100% FiO2 and bronchoscope lubricated. Sedation: fentanyl 100 mcg  Airway entered and the following bronchi were examined: Bronchi.   Procedures performed:  Inspection. Minimal area of bleeding noted in trachea towards right suggesting suction trauma. Mucus with blood mixed suctioned out of RML. All other airways were patent , no significant bleeding or secretions noted Bronchoscope removed.  , Patient placed back on 100% FiO2 at conclusion of procedure.    Evaluation Hemodynamic Status: BP stable throughout; O2 sats: stable throughout Patient's Current Condition: stable Specimens:  None Complications: No apparent complications Patient did tolerate procedure well.   Pictures scanned into media tab  Catherine Williams 10/17/2018

## 2018-10-17 NOTE — Progress Notes (Signed)
RN was in with patient at 0600. Patient was taking very frequent and shallow breaths. RN attempted to inline suction patient and only minimal amounts of sputum were coming up. Sputum was very thick and was bloody. Patient's HR began to drop and she went Sinus brady. The patients arterial line waveform went flat and no pulse could be found at 0627(rhythm: PEA). RN started CPR and code blue was called. Epi 1mg  was given and CPR continued. ROSC achieved at 0634.   Patient groggy but responds to voice. Resident MDs at bedside and CCM E-Link called.   Patients family notified and states they will be on the way to the hospital.    Catherine Williams

## 2018-10-17 NOTE — Progress Notes (Signed)
   10/17/18 0600  Clinical Encounter Type  Visited With Patient  Visit Type Initial  Referral From Nurse  Consult/Referral To Chaplain  Spiritual Encounters  Spiritual Needs Prayer  Stress Factors  Patient Stress Factors None identified   Responded to a code blue. PT was unresponsive and being care for. No family present at this time. I offered spiritual care with silent prayer and ministry of presence.   Chaplain Fidel Levy [phone]

## 2018-10-17 NOTE — Progress Notes (Signed)
DAILY PROGRESS NOTE   Patient Name: Catherine Williams Date of Encounter: 10/17/2018  Chief Complaint   Intubated  Patient Profile   57 y.o. female with sudden arrest with long ROSC. No signs of acute MI with no ECG changes and negativeTroponin Preliminary TTE with normal EF severe LVH. She has had multiple, recurrent episodes of PEA arrest, including this morning - given EPI and noted to have NSVT/SVT afterwards on telemetry, but now in normal sinus rhythm.  Subjective   PEA arrest noted this morning - tele review demonstrates likely post-epi arryhthmias including SVT and NSVT, RBBB morphology, now sinus rhythm. On levophed and cefepime - low grade temps today. ?developing HCAP. Noted to be acidemic this morning at 4 am ABG - may have contributed to PEA. CVP 8 today. Echo on 10/21 showed LVEF 60-65% with moderate LVH and grade 2 DD.  Objective   Vitals:   10/17/18 0815 10/17/18 0855 10/17/18 0900 10/17/18 0902  BP: (!) 156/17  (!) 147/41 (!) 147/41  Pulse: 91  91 81  Resp: (!) 24  20 20   Temp: (!) 100.6 F (38.1 C)  (!) 100.9 F (38.3 C)   TempSrc:      SpO2: 100% 100% 100% 100%  Weight:      Height:        Intake/Output Summary (Last 24 hours) at 10/17/2018 0953 Last data filed at 10/17/2018 0900 Gross per 24 hour  Intake 1508.84 ml  Output 3558 ml  Net -2049.16 ml   Filed Weights   10/15/18 0500 10/16/18 0500 10/17/18 0600  Weight: 134.5 kg 132.9 kg 130.3 kg    Physical Exam   General appearance: intubated, lightly sedated on vent, opens eyes Neck: no carotid bruit, no JVD and thyroid not enlarged, symmetric, no tenderness/mass/nodules Lungs: diminished breath sounds bibasilar Heart: regular rate and rhythm Abdomen: soft, non-tender; bowel sounds normal; no masses,  no organomegaly and obese Extremities: extremities normal, atraumatic, no cyanosis or edema Pulses: 2+ and symmetric Skin: Skin color, texture, turgor normal. No rashes or lesions Neurologic:  Mental status: intubated, sedated Psych: Cannot assess  Inpatient Medications    Scheduled Meds: . aspirin  81 mg Oral Daily  . carvedilol  3.125 mg Oral BID WC  . chlorhexidine gluconate (MEDLINE KIT)  15 mL Mouth Rinse BID  . Chlorhexidine Gluconate Cloth  6 each Topical Daily  . darbepoetin (ARANESP) injection - DIALYSIS  100 mcg Intravenous Q Sat-HD  . docusate  100 mg Per Tube BID  . feeding supplement (PRO-STAT SUGAR FREE 64)  30 mL Per Tube TID  . feeding supplement (VITAL HIGH PROTEIN)  1,000 mL Per Tube Q24H  . heparin injection (subcutaneous)  5,000 Units Subcutaneous Q8H  . insulin aspart  1-3 Units Subcutaneous Q4H  . insulin glargine  10 Units Subcutaneous Daily  . ipratropium-albuterol  3 mL Nebulization Q6H  . mouth rinse  15 mL Mouth Rinse 10 times per day  . pantoprazole sodium  40 mg Per Tube Daily  . rosuvastatin  10 mg Per Tube QPM  . sodium chloride flush  10-40 mL Intracatheter Q12H    Continuous Infusions: . sodium chloride 10 mL/hr at 10/17/18 0900  . ceFEPime (MAXIPIME) IV 2 g (10/17/18 0941)  . heparin 10,000 units/ 20 mL infusion syringe 1,500 Units/hr (10/17/18 0307)  . heparin 999 mL/hr at 10/12/18 2229  . norepinephrine (LEVOPHED) Adult infusion 16 mcg/min (10/17/18 0900)  . prismasol BGK 4/2.5 Stopped (10/17/18 0445)  . prismasol BGK 4/2.5  Stopped (10/17/18 0445)  . prismasol BGK 4/2.5 Stopped (10/17/18 0445)    PRN Meds: acetaminophen (TYLENOL) oral liquid 160 mg/5 mL, bisacodyl, fentaNYL (SUBLIMAZE) injection, heparin, heparin, heparin, sodium chloride flush   Labs   Results for orders placed or performed during the hospital encounter of 10/07/18 (from the past 48 hour(s))  Culture, Urine     Status: Abnormal (Preliminary result)   Collection Time: 10/15/18 11:02 AM  Result Value Ref Range   Specimen Description URINE, CATHETERIZED    Special Requests      NONE Performed at [facility], [address] [place], [place] [address]     Culture >=100,000 COLONIES/mL SERRATIA MARCESCENS (A)    Report Status PENDING   Glucose, capillary     Status: Abnormal   Collection Time: 10/15/18 11:25 AM  Result Value Ref Range   Glucose-Capillary 188 (H) 70 - 99 mg/dL  POCT Activated clotting time     Status: None   Collection Time: 10/15/18 11:44 AM  Result Value Ref Range   Activated Clotting Time 197 seconds  Renal function panel (daily at 1600)     Status: Abnormal   Collection Time: 10/15/18  3:09 PM  Result Value Ref Range   Sodium 139 135 - 145 mmol/L   Potassium 4.6 3.5 - 5.1 mmol/L   Chloride 103 98 - 111 mmol/L   CO2 26 22 - 32 mmol/L   Glucose, Bld 199 (H) 70 - 99 mg/dL   BUN 51 (H) 6 - 20 mg/dL   Creatinine, Ser 2.65 (H) 0.44 - 1.00 mg/dL   Calcium 10.4 (H) 8.9 - 10.3 mg/dL   Phosphorus 3.2 2.5 - 4.6 mg/dL   Albumin 2.2 (L) 3.5 - 5.0 g/dL   GFR calc non Af Amer 19 (L) >60 mL/min   GFR calc Af Amer 22 (L) >60 mL/min    Comment: (NOTE) The eGFR has been calculated using the CKD EPI equation. This calculation has not been validated in all clinical situations. eGFR's persistently <60 mL/min signify possible Chronic Kidney Disease.    Anion gap 10 5 - 15    Comment: Performed at [facility] [address] [place], [place] [address]  Glucose, capillary     Status: Abnormal   Collection Time: 10/15/18  3:30 PM  Result Value Ref Range   Glucose-Capillary 181 (H) 70 - 99 mg/dL  POCT Activated clotting time     Status: None   Collection Time: 10/15/18  3:33 PM  Result Value Ref Range   Activated Clotting Time 219 seconds  Glucose, capillary     Status: Abnormal   Collection Time: 10/15/18  8:45 PM  Result Value Ref Range   Glucose-Capillary 190 (H) 70 - 99 mg/dL   Comment 1 Arterial Specimen   POCT Activated clotting time     Status: None   Collection Time: 10/15/18  8:48 PM  Result Value Ref Range   Activated Clotting Time 191 seconds  Glucose, capillary     Status: Abnormal   Collection Time:  10/16/18 12:03 AM  Result Value Ref Range   Glucose-Capillary 196 (H) 70 - 99 mg/dL   Comment 1 Arterial Specimen   POCT Activated clotting time     Status: None   Collection Time: 10/16/18  2:08 AM  Result Value Ref Range   Activated Clotting Time 208 seconds  Glucose, capillary     Status: Abnormal   Collection Time: 10/16/18  4:01 AM  Result Value Ref  Range   Glucose-Capillary 206 (H) 70 - 99 mg/dL   Comment 1 Arterial Specimen   Renal function panel (daily at 0500)     Status: Abnormal   Collection Time: 10/16/18  5:19 AM  Result Value Ref Range   Sodium 137 135 - 145 mmol/L   Potassium 4.4 3.5 - 5.1 mmol/L   Chloride 102 98 - 111 mmol/L   CO2 26 22 - 32 mmol/L   Glucose, Bld 210 (H) 70 - 99 mg/dL   BUN 48 (H) 6 - 20 mg/dL   Creatinine, Ser 2.33 (H) 0.44 - 1.00 mg/dL   Calcium 10.4 (H) 8.9 - 10.3 mg/dL   Phosphorus 3.3 2.5 - 4.6 mg/dL   Albumin 2.1 (L) 3.5 - 5.0 g/dL   GFR calc non Af Amer 22 (L) >60 mL/min   GFR calc Af Amer 25 (L) >60 mL/min    Comment: (NOTE) The eGFR has been calculated using the CKD EPI equation. This calculation has not been validated in all clinical situations. eGFR's persistently <60 mL/min signify possible Chronic Kidney Disease.    Anion gap 9 5 - 15    Comment: Performed at [facility] [address] [place], [place] [address]  Magnesium     Status: Abnormal   Collection Time: 10/16/18  5:19 AM  Result Value Ref Range   Magnesium 2.7 (H) 1.7 - 2.4 mg/dL    Comment: Performed at [facility] [address] [place], [place] [address]  APTT     Status: Abnormal   Collection Time: 10/16/18  5:19 AM  Result Value Ref Range   aPTT 167 (HH) 24 - 36 seconds    Comment:        IF BASELINE aPTT IS ELEVATED, SUGGEST PATIENT RISK ASSESSMENT BE USED TO DETERMINE APPROPRIATE ANTICOAGULANT THERAPY. REPEATED TO VERIFY CRITICAL RESULT CALLED TO, READ BACK BY AND VERIFIED WITH: KATIE ROBERTS,RN AT 0715 10/16/18 BY ZBEECH. Performed  at [facility], [place] [address], [place], [place] [address]   CBC with Differential/Platelet     Status: Abnormal   Collection Time: 10/16/18  5:19 AM  Result Value Ref Range   WBC 12.7 (H) 4.0 - 10.5 K/uL   RBC 3.64 (L) 3.87 - 5.11 MIL/uL   Hemoglobin 9.0 (L) 12.0 - 15.0 g/dL   HCT 33.7 (L) 36.0 - 46.0 %   MCV 92.6 80.0 - 100.0 fL   MCH 24.7 (L) 26.0 - 34.0 pg   MCHC 26.7 (L) 30.0 - 36.0 g/dL   RDW 15.7 (H) 11.5 - 15.5 %   Platelets 203 150 - 400 K/uL   nRBC 0.5 (H) 0.0 - 0.2 %   Neutrophils Relative % 81 %   Neutro Abs 10.3 (H) 1.7 - 7.7 K/uL   Lymphocytes Relative 12 %   Lymphs Abs 1.5 0.7 - 4.0 K/uL   Monocytes Relative 3 %   Monocytes Absolute 0.4 0.1 - 1.0 K/uL   Eosinophils Relative 2 %   Eosinophils Absolute 0.2 0.0 - 0.5 K/uL   Basophils Relative 0 %   Basophils Absolute 0.0 0.0 - 0.1 K/uL   Immature Granulocytes 2 %   Abs Immature Granulocytes 0.31 (H) 0.00 - 0.07 K/uL    Comment: Performed at [facility], [place] [address], [place],  [address]  POCT Activated clotting time     Status: None   Collection Time: 10/16/18  5:24 AM  Result Value Ref Range   Activated Clotting  Time 202 seconds  I-STAT 3, arterial blood gas (G3+)     Status: Abnormal   Collection Time: 10/16/18  5:39 AM  Result Value Ref Range   pH, Arterial 7.300 (L) 7.350 - 7.450   pCO2 arterial 56.7 (H) 32.0 - 48.0 mmHg   pO2, Arterial 84.0 83.0 - 108.0 mmHg   Bicarbonate 28.1 (H) 20.0 - 28.0 mmol/L   TCO2 30 22 - 32 mmol/L   O2 Saturation 95.0 %   Acid-Base Excess 1.0 0.0 - 2.0 mmol/L   Patient temperature 36.5 C    Sample type ARTERIAL   Glucose, capillary     Status: Abnormal   Collection Time: 10/16/18  8:18 AM  Result Value Ref Range   Glucose-Capillary 148 (H) 70 - 99 mg/dL   Comment 1 Notify RN   POCT Activated clotting time     Status: None   Collection Time: 10/16/18 10:23 AM  Result Value Ref Range   Activated Clotting Time 252 seconds  Glucose, capillary      Status: Abnormal   Collection Time: 10/16/18 11:45 AM  Result Value Ref Range   Glucose-Capillary 181 (H) 70 - 99 mg/dL   Comment 1 Notify RN   POCT Activated clotting time     Status: None   Collection Time: 10/16/18 12:04 PM  Result Value Ref Range   Activated Clotting Time 241 seconds  POCT Activated clotting time     Status: None   Collection Time: 10/16/18  1:21 PM  Result Value Ref Range   Activated Clotting Time 224 seconds  POCT Activated clotting time     Status: None   Collection Time: 10/16/18  2:24 PM  Result Value Ref Range   Activated Clotting Time 219 seconds  Glucose, capillary     Status: Abnormal   Collection Time: 10/16/18  4:17 PM  Result Value Ref Range   Glucose-Capillary 165 (H) 70 - 99 mg/dL   Comment 1 Notify RN   Renal function panel (daily at 1600)     Status: Abnormal   Collection Time: 10/16/18  4:21 PM  Result Value Ref Range   Sodium 137 135 - 145 mmol/L   Potassium 4.4 3.5 - 5.1 mmol/L   Chloride 102 98 - 111 mmol/L   CO2 23 22 - 32 mmol/L   Glucose, Bld 191 (H) 70 - 99 mg/dL   BUN 46 (H) 6 - 20 mg/dL   Creatinine, Ser 2.26 (H) 0.44 - 1.00 mg/dL   Calcium 10.4 (H) 8.9 - 10.3 mg/dL   Phosphorus 2.8 2.5 - 4.6 mg/dL   Albumin 2.3 (L) 3.5 - 5.0 g/dL   GFR calc non Af Amer 23 (L) >60 mL/min   GFR calc Af Amer 26 (L) >60 mL/min    Comment: (NOTE) The eGFR has been calculated using the CKD EPI equation. This calculation has not been validated in all clinical situations. eGFR's persistently <60 mL/min signify possible Chronic Kidney Disease.    Anion gap 12 5 - 15    Comment: Performed at [facility] [address], [place], [place] [address]  Glucose, capillary     Status: Abnormal   Collection Time: 10/16/18  8:16 PM  Result Value Ref Range   Glucose-Capillary 177 (H) 70 - 99 mg/dL   Comment 1 Arterial Specimen   POCT Activated clotting time     Status: None   Collection Time: 10/16/18  8:23 PM  Result Value Ref Range   Activated  Clotting Time  213 seconds  Glucose, capillary     Status: Abnormal   Collection Time: 10/17/18 12:13 AM  Result Value Ref Range   Glucose-Capillary 177 (H) 70 - 99 mg/dL   Comment 1 Arterial Specimen   POCT Activated clotting time     Status: None   Collection Time: 10/17/18 12:19 AM  Result Value Ref Range   Activated Clotting Time 208 seconds  Glucose, capillary     Status: Abnormal   Collection Time: 10/17/18  4:11 AM  Result Value Ref Range   Glucose-Capillary 221 (H) 70 - 99 mg/dL   Comment 1 Arterial Specimen   POCT Activated clotting time     Status: None   Collection Time: 10/17/18  4:12 AM  Result Value Ref Range   Activated Clotting Time 202 seconds  Renal function panel (daily at 0500)     Status: Abnormal   Collection Time: 10/17/18  4:14 AM  Result Value Ref Range   Sodium 138 135 - 145 mmol/L   Potassium 4.4 3.5 - 5.1 mmol/L   Chloride 101 98 - 111 mmol/L   CO2 24 22 - 32 mmol/L   Glucose, Bld 232 (H) 70 - 99 mg/dL   BUN 44 (H) 6 - 20 mg/dL   Creatinine, Ser 2.37 (H) 0.44 - 1.00 mg/dL   Calcium 10.7 (H) 8.9 - 10.3 mg/dL   Phosphorus 3.5 2.5 - 4.6 mg/dL   Albumin 2.3 (L) 3.5 - 5.0 g/dL   GFR calc non Af Amer 21 (L) >60 mL/min   GFR calc Af Amer 25 (L) >60 mL/min    Comment: (NOTE) The eGFR has been calculated using the CKD EPI equation. This calculation has not been validated in all clinical situations. eGFR's persistently <60 mL/min signify possible Chronic Kidney Disease.    Anion gap 13 5 - 15    Comment: Performed at [facility] [address] [place], [place] [address]  Magnesium     Status: Abnormal   Collection Time: 10/17/18  4:14 AM  Result Value Ref Range   Magnesium 2.6 (H) 1.7 - 2.4 mg/dL    Comment: Performed at [facility] [address] [place], [place] [address]  APTT     Status: Abnormal   Collection Time: 10/17/18  4:14 AM  Result Value Ref Range   aPTT 137 (H) 24 - 36 seconds    Comment:        IF BASELINE aPTT IS  ELEVATED, SUGGEST PATIENT RISK ASSESSMENT BE USED TO DETERMINE APPROPRIATE ANTICOAGULANT THERAPY. Performed at [facility], [place] [address], [place], [place] [address]   CBC with Differential/Platelet     Status: Abnormal   Collection Time: 10/17/18  4:14 AM  Result Value Ref Range   WBC 19.5 (H) 4.0 - 10.5 K/uL   RBC 3.81 (L) 3.87 - 5.11 MIL/uL   Hemoglobin 9.4 (L) 12.0 - 15.0 g/dL   HCT 35.2 (L) 36.0 - 46.0 %   MCV 92.4 80.0 - 100.0 fL   MCH 24.7 (L) 26.0 - 34.0 pg   MCHC 26.7 (L) 30.0 - 36.0 g/dL   RDW 15.6 (H) 11.5 - 15.5 %   Platelets 214 150 - 400 K/uL   nRBC 1.1 (H) 0.0 - 0.2 %   Neutrophils Relative % 73 %   Neutro Abs 14.3 (H) 1.7 - 7.7 K/uL   Lymphocytes Relative 12 %   Lymphs Abs 2.4 0.7 - 4.0 K/uL   Monocytes Relative 8 %  Monocytes Absolute 1.5 (H) 0.1 - 1.0 K/uL   Eosinophils Relative 1 %   Eosinophils Absolute 0.3 0.0 - 0.5 K/uL   Basophils Relative 1 %   Basophils Absolute 0.1 0.0 - 0.1 K/uL   Immature Granulocytes 5 %   Abs Immature Granulocytes 0.95 (H) 0.00 - 0.07 K/uL    Comment: Performed at [facility] [address], [place], [place] [address]  I-STAT 3, arterial blood gas (G3+)     Status: Abnormal   Collection Time: 10/17/18  4:19 AM  Result Value Ref Range   pH, Arterial 7.291 (L) 7.350 - 7.450   pCO2 arterial 58.7 (H) 32.0 - 48.0 mmHg   pO2, Arterial 74.0 (L) 83.0 - 108.0 mmHg   Bicarbonate 28.2 (H) 20.0 - 28.0 mmol/L   TCO2 30 22 - 32 mmol/L   O2 Saturation 92.0 %   Acid-Base Excess 1.0 0.0 - 2.0 mmol/L   Patient temperature 37.3 C    Sample type ARTERIAL   Renal function panel     Status: Abnormal   Collection Time: 10/17/18  7:28 AM  Result Value Ref Range   Sodium 137 135 - 145 mmol/L   Potassium 4.2 3.5 - 5.1 mmol/L   Chloride 100 98 - 111 mmol/L   CO2 24 22 - 32 mmol/L   Glucose, Bld 245 (H) 70 - 99 mg/dL   BUN 50 (H) 6 - 20 mg/dL   Creatinine, Ser 2.69 (H) 0.44 - 1.00 mg/dL   Calcium 10.6 (H) 8.9 - 10.3 mg/dL    Phosphorus 3.8 2.5 - 4.6 mg/dL   Albumin 2.3 (L) 3.5 - 5.0 g/dL   GFR calc non Af Amer 18 (L) >60 mL/min   GFR calc Af Amer 21 (L) >60 mL/min    Comment: (NOTE) The eGFR has been calculated using the CKD EPI equation. This calculation has not been validated in all clinical situations. eGFR's persistently <60 mL/min signify possible Chronic Kidney Disease.    Anion gap 13 5 - 15    Comment: Performed at [facility] [address] [place], [place] [address]  Magnesium     Status: Abnormal   Collection Time: 10/17/18  7:28 AM  Result Value Ref Range   Magnesium 2.7 (H) 1.7 - 2.4 mg/dL    Comment: Performed at [facility] [address], [place], [place] [address]  Lactic acid, plasma     Status: None   Collection Time: 10/17/18  7:28 AM  Result Value Ref Range   Lactic Acid, Venous 1.2 0.5 - 1.9 mmol/L    Comment: Performed at [facility] [address], [place], [place] [address]  Glucose, random     Status: Abnormal   Collection Time: 10/17/18  7:28 AM  Result Value Ref Range   Glucose, Bld 240 (H) 70 - 99 mg/dL    Comment: Performed at [facility] [address] [place], [place] [address]  Troponin I     Status: Abnormal   Collection Time: 10/17/18  7:28 AM  Result Value Ref Range   Troponin I 0.03 (HH) <0.03 ng/mL    Comment: CRITICAL RESULT CALLED TO, READ BACK BY AND VERIFIED WITH: SARA BURNHAM,RN AT 3009 10/17/18 BY ZBEECH. Performed at [facility], [place] [address] [place], [place] [address]   I-STAT 3, arterial blood gas (G3+)     Status: Abnormal   Collection Time: 10/17/18  7:39 AM  Result Value Ref  Range   pH, Arterial 7.312 (L) 7.350 - 7.450   pCO2 arterial 57.2 (H) 32.0 - 48.0 mmHg   pO2, Arterial 115.0 (H) 83.0 - 108.0 mmHg   Bicarbonate 28.6 (H) 20.0 - 28.0 mmol/L   TCO2 30 22 - 32 mmol/L   O2 Saturation 98.0 %   Acid-Base Excess 2.0 0.0 - 2.0 mmol/L   Patient temperature 37.9 C    Sample type ARTERIAL   Glucose,  capillary     Status: Abnormal   Collection Time: 10/17/18  8:17 AM  Result Value Ref Range   Glucose-Capillary 226 (H) 70 - 99 mg/dL   Comment 1 Capillary Specimen    Comment 2 Notify RN     ECG   Sinus tach at 124, no ischemia changes - personally reviewed (this was likely post-epi)  Telemetry   Sinus tach/SVT/NSVT around 6:30 am, likely related to epi for PEA - Personally Reviewed  Radiology    Dg Chest Port 1 View  Result Date: 10/17/2018 CLINICAL DATA:  Hypoxia EXAM: PORTABLE CHEST 1 VIEW COMPARISON:  October 16, 2018 FINDINGS: Endotracheal tube tip is 4.3 cm above the carina. Nasogastric tube tip and side port below the diaphragm. Right jugular catheter tip is in the superior vena cava. Left jugular catheter tip is at the junction of the left innominate vein and superior vena cava. No pneumothorax. There is mild bibasilar atelectasis. Lungs elsewhere are clear. The heart is upper normal in size with pulmonary vascularity normal. No adenopathy. No bone lesions. IMPRESSION: Tube and catheter positions as described without pneumothorax. Mild bibasilar atelectasis. No consolidation. Stable cardiac silhouette. Electronically Signed   By: Lowella Grip III M.D.   On: 10/17/2018 07:10   Dg Chest Port 1 View  Result Date: 10/16/2018 CLINICAL DATA:  Acute respiratory failure EXAM: PORTABLE CHEST 1 VIEW COMPARISON:  10/13/2018 FINDINGS: Endotracheal tube in good position and unchanged. Right jugular central venous catheter tip in the SVC. Left jugular central venous catheter tip in the SVC unchanged. NG tube enters the stomach with the tip not visualized. Mild bibasilar airspace disease has improved in the interval. Negative for edema or effusion. IMPRESSION: Support lines remain in good position. Bibasilar airspace disease with interval improvement. Electronically Signed   By: Franchot Gallo M.D.   On: 10/16/2018 09:40    Cardiac Studies   N/A  Assessment   1. Active  Problems: 2.   Acute respiratory failure ([facility]) 3.   Cardiac arrest ([facility]) 4.   AKI (acute kidney injury) ([facility]) 5.   CKD (chronic kidney disease) stage 5, GFR less than 15 ml/min ([facility]) 6.   Ventilator dependent (H[facility]) 7.   Endotracheal tube present 8.   Mucus plugging of bronchi 9.   Pressure injury of skin 10.   Plan   1. Recurrent respiratory failure with acidemia, suggestive of poor ventilation - probably contributed to PEA arrest. No s/s of CHF with normal CVP - echo recently with normal systolic function and moderate diastolic dysfunction. Troponins, including this morning post-arrest, have been normal. Reasonable to pursue ischemia evaluation, but will need to stabilize respiratory status first - possible progression to trach. Creatinine of 2.69 would not support cath with high risk of contrast nephropathy and progression to dialysis. Could consider non-invasive ischemia evaluation, but would have to accept risk of dialysis if the study was markedly abnormal.  Time Spent Directly with Patient:  I have spent a total of 25 minutes with the patient reviewing hospital notes, telemetry, EKGs, labs and  examining the patient as well as establishing an assessment and plan that was discussed personally with the patient.  > 50% of time was spent in direct patient care.  Length of Stay:  LOS: 10 days   Pixie Casino, MD, [facility], [place] Director of the Advanced Lipid Disorders &  Cardiovascular Risk Reduction Clinic Diplomate of the American Board of Clinical Lipidology Attending Cardiologist  Direct Dial: [phone]  Fax: [phone]  Website:  [email or web address] 10/17/2018, 9:53 AM

## 2018-10-17 NOTE — Progress Notes (Signed)
Pt bronched at bedside.  Vent settings returned to normal. Pt tolerated well.

## 2018-10-17 NOTE — Progress Notes (Signed)
57 year old obese woman with OSA and CKD stage V admitted 10/20 after PEA cardiac arrest.  Underwent hypothermia protocol and started on CRRT  10/26  sustained another brief PEA arrest  related to mucous plugging, underwent bronchoscopy.  Unfortunately she had another episode of brief PEA arrest this morning, review of her episode and speaking to overnight nurse seems to be difficulty bagging followed by bradycardia and PEA arrest which resolved once secretions were removed. I emergently performed bronchoscopy and minimal secretions noted in the right middle lobe, blood mixed with mucus but otherwise airways were clear of secretions  On exam-obese woman sedated but follows commands and moves hands and toes, appears weak and deconditioned, decreased breath sounds bilateral, S1-S2 distant, no rub or murmur, 1+ edema.  Chest x-ray shows new left lower lobe patchy infiltrate Labs show negative troponins and lactate, new leukocytosis and mild anemia. ABG shows acute respiratory acidosis consistent with inability to ventilate.  Impression/plan  Cardiac arrest-initial event of unclear cause, cardiac ischemia felt to be unlikely, 2 other episodes seem to be related to mucous plugging, no arrhythmias noted during this admit.  Cardiogenic shock postarrest-titrate Levophed to SBP 100 and above  Acute respiratory failure -hold on spontaneous breathing trials, due to repeated episodes of mucous plugging may need to proceed with tracheostomy early viral and extubation.  HCAP/Serratia UTI -continue cefepime, respiratory cultures negative  AKI -will likely need long-term dialysis per renal, and permacath being planned.  Daughter updated at bedside in detail  The patient is critically ill with multiple organ systems failure and requires high complexity decision making for assessment and support, frequent evaluation and titration of therapies, application of advanced monitoring technologies and extensive  interpretation of multiple databases. Critical Care Time devoted to patient care services described in this note independent of APP/resident  Time and procedures is 35 minutes.  Leanna Sato Elsworth Soho MD

## 2018-10-17 NOTE — Consult Note (Signed)
Chief Complaint: Patient was seen in consultation today for temporary dialysis catheter to tunneled dialysis catheter Chief Complaint  Patient presents with  . Post arrest   at the request of Dr Corliss Marcus   Supervising Physician: Marybelle Killings  Patient Status: [facility] - In-pt  History of Present Illness: Catherine Williams is a 57 y.o. female   OSA CKD 5 Sudden cardiac arrest Long ROSC Continued episodes PEA-- even this am  Acute renal failure CRRT started 10/12/18; temp cath placed CCM No evidence of renal recovery after cardiac issues  Request for temp cath conversion to tunneled catheter Tmax 101.3 today Wbc 19.5     Past Medical History:  Diagnosis Date  . Anemia   . Arthritis Dec. 2014   Gout  . Asthma   . CHF (congestive heart failure) ([place])   . COPD (chronic obstructive pulmonary disease) ([place])   . Diabetes mellitus   . GERD (gastroesophageal reflux disease)   . Heart failure   . Hyperlipidemia   . Hypertension   . Kidney disease   . Morbid obesity ([place])   . Peripheral vascular disease ([place])   . Pinched nerve   . Requires supplemental oxygen    as needed  . Sleep apnea    wears cpap  . Wears dentures     Past Surgical History:  Procedure Laterality Date  . Atherectomy and angioplasty  10/18/2011   left posterior tibial artery  . AV FISTULA PLACEMENT Left 10/04/2018   Procedure: ARTERIOVENOUS (AV) FISTULA CREATION LEFT ARM;  Surgeon: Serafina Mitchell, MD;  Location: [place];  Service: Vascular;  Laterality: Left;  . COLONOSCOPY    . HAND SURGERY     right  . MULTIPLE TOOTH EXTRACTIONS    . OVARY SURGERY     Left  . TOE AMPUTATION  Sept. 25,2012   Left 4th and 5th toes    Allergies: Omnipaque [iohexol]  Medications: Prior to Admission medications   Medication Sig Start Date End Date Taking? Authorizing Provider  amLODipine (NORVASC) 5 MG tablet Take 1 tablet (5 mg total) by mouth daily. 05/19/18  Yes Roxan Hockey, MD  carvedilol (COREG)  6.25 MG tablet Take 1 tablet (6.25 mg total) by mouth 2 (two) times daily with a meal. 05/18/18  Yes Emokpae, Courage, MD  furosemide (LASIX) 80 MG tablet Take 1 tablet (80 mg)  in the morning, and 1/2 tablet (40 mg)  in the evening Patient taking differently: Take 40-80 mg by mouth See admin instructions. Take 1 tablet (80 mg)  in the morning, and 1/2 tablet (40 mg)  in the evening 05/18/18  Yes Emokpae, Courage, MD  insulin aspart (NOVOLOG) 100 UNIT/ML injection Inject 20 Units into the skin 3 (three) times daily with meals.    Yes [provider]  insulin glargine (LANTUS) 100 UNIT/ML injection Inject 60 Units into the skin at bedtime.    Yes [provider]  isosorbide-hydrALAZINE (BIDIL) 20-37.5 MG tablet Take 2 tablets by mouth 3 (three) times daily. Patient taking differently: Take 1 tablet by mouth 3 (three) times daily.  05/18/18  Yes Emokpae, Courage, MD  rosuvastatin (CRESTOR) 40 MG tablet Take 40 mg by mouth every evening.    Yes [provider]  albuterol (PROVENTIL HFA;VENTOLIN HFA) 108 (90 BASE) MCG/ACT inhaler Inhale 1-2 puffs into the lungs every 6 (six) hours as needed for wheezing. 07/25/13   Harden Mo, MD  allopurinol (ZYLOPRIM) 100 MG tablet Take 1 tablet (100 mg total)  by mouth at bedtime. 07/16/18   Jessy Oto, MD  aspirin 81 MG tablet Take 81 mg by mouth daily.      [provider]  Darbepoetin Alfa (ARANESP) 60 MCG/0.3ML SOSY injection Inject 0.3 mLs (60 mcg total) into the skin every Thursday at 6pm. Patient taking differently: Inject 60 mcg into the skin every 30 (thirty) days.  11/03/16   Doreatha Lew, MD  ferrous sulfate 325 (65 FE) MG tablet Take 1 tablet (325 mg total) by mouth 2 (two) times daily with a meal. 11/03/16   Patrecia Pour, Christean Grief, MD  HYDROcodone-acetaminophen (NORCO/VICODIN) 5-325 MG tablet Take 1 tablet by mouth every 6 (six) hours as needed. Patient taking differently: Take 1 tablet by mouth every 6 (six) hours  as needed for moderate pain.  07/03/18   Loura Halt A, NP  loratadine (CLARITIN) 10 MG tablet Take 10 mg by mouth daily as needed for allergies.     [provider]  oxyCODONE (ROXICODONE) 5 MG immediate release tablet Take 1 tablet (5 mg total) by mouth every 8 (eight) hours as needed. 10/04/18 10/04/19  Serafina Mitchell, MD  pantoprazole (PROTONIX) 40 MG tablet Take 1 tablet (40 mg total) by mouth daily. 07/21/15   Barton Dubois, MD     Family History  Problem Relation Age of Onset  . Lung cancer Mother   . Clotting disorder Mother   . Heart disease Mother   . Cancer Mother        Lung  . Deep vein thrombosis Mother   . Diabetes Mother   . Hypertension Mother   . Varicose Veins Mother   . Cancer Father   . Clotting disorder Sister   . Heart attack Sister   . Diabetes Sister   . Clotting disorder Brother   . Deep vein thrombosis Brother   . Diabetes Brother   . Heart disease Brother   . Heart disease Sister     Social History   Socioeconomic History  . Marital status: Single    Spouse name: Not on file  . Number of children: Y  . Years of education: Not on file  . Highest education level: Not on file  Occupational History  . 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  Social Needs  . Financial resource strain: Not on file  . Food insecurity:    Worry: Not on file    Inability: Not on file  . Transportation needs:    Medical: Not on file    Non-medical: Not on file  Tobacco Use  . Smoking status: Never Smoker  . Smokeless tobacco: Never Used  Substance and Sexual Activity  . Alcohol use: No    Alcohol/week: 0.0 standard drinks  . Drug use: Not Currently    Types: Marijuana    Comment: FORMER>>smoked weed from age 50 to 38>> quit at age 46   . Sexual activity: Not on file  Lifestyle  . Physical activity:    Days per week: Not on file    Minutes per session: Not on file  . Stress: Not on file    Relationships  . Social connections:    Talks on phone: Not on file    Gets together: Not on file    Attends religious service: Not on file    Active member of club or organization: Not on file    Attends meetings of clubs or organizations: Not on  file    Relationship status: Not on file  Other Topics Concern  . Not on file  Social History Narrative   Lives with daughter          Review of Systems: A 12 point ROS discussed and pertinent positives are indicated in the HPI above.  All other systems are negative.  Review of Systems  Respiratory:       Vent    Vital Signs: BP (!) 174/22   Pulse 94   Temp (!) 101.3 F (38.5 C)   Resp (!) 33   Ht 5\' 2"  (1.575 m)   Wt 287 lb 4.2 oz (130.3 kg)   SpO2 100%   BMI 52.54 kg/m   Physical Exam  Neck:  Temp cath Rt IJ  Cardiovascular: Normal rate and regular rhythm.  Pulmonary/Chest:  vent  Abdominal: Soft.  Musculoskeletal:  No movement No response  Skin: Skin is warm and dry.  Psychiatric:  Consent for procedure obtained with Dtr at bedside  Vitals reviewed.   Imaging: Ct Head Wo Contrast  Result Date: 10/07/2018 CLINICAL DATA:  Altered mental status. EXAM: CT HEAD WITHOUT CONTRAST TECHNIQUE: Contiguous axial images were obtained from the base of the skull through the vertex without intravenous contrast. COMPARISON:  07/02/2015 FINDINGS: Brain: No acute intracranial abnormality. Specifically, no hemorrhage, hydrocephalus, mass lesion, acute infarction, or significant intracranial injury. Vascular: No hyperdense vessel or unexpected calcification. Skull: No acute calvarial abnormality. Sinuses/Orbits: No acute finding Other: None IMPRESSION: No acute intracranial abnormality. Electronically Signed   By: Rolm Baptise M.D.   On: 10/07/2018 22:24   Dg Chest Port 1 View  Result Date: 10/17/2018 CLINICAL DATA:  Hypoxia EXAM: PORTABLE CHEST 1 VIEW COMPARISON:  October 16, 2018 FINDINGS: Endotracheal tube tip is 4.3 cm above  the carina. Nasogastric tube tip and side port below the diaphragm. Right jugular catheter tip is in the superior vena cava. Left jugular catheter tip is at the junction of the left innominate vein and superior vena cava. No pneumothorax. There is mild bibasilar atelectasis. Lungs elsewhere are clear. The heart is upper normal in size with pulmonary vascularity normal. No adenopathy. No bone lesions. IMPRESSION: Tube and catheter positions as described without pneumothorax. Mild bibasilar atelectasis. No consolidation. Stable cardiac silhouette. Electronically Signed   By: Lowella Grip III M.D.   On: 10/17/2018 07:10   Dg Chest Port 1 View  Result Date: 10/16/2018 CLINICAL DATA:  Acute respiratory failure EXAM: PORTABLE CHEST 1 VIEW COMPARISON:  10/13/2018 FINDINGS: Endotracheal tube in good position and unchanged. Right jugular central venous catheter tip in the SVC. Left jugular central venous catheter tip in the SVC unchanged. NG tube enters the stomach with the tip not visualized. Mild bibasilar airspace disease has improved in the interval. Negative for edema or effusion. IMPRESSION: Support lines remain in good position. Bibasilar airspace disease with interval improvement. Electronically Signed   By: Franchot Gallo M.D.   On: 10/16/2018 09:40   Dg Chest Port 1 View  Result Date: 10/13/2018 CLINICAL DATA:  Hypoxia EXAM: PORTABLE CHEST 1 VIEW COMPARISON:  October 13, 2018 FINDINGS: Endotracheal tube tip is 3.7 cm above the carina. Nasogastric tube tip and side port are below the diaphragm. Right jugular catheter tip is in the superior vena cava. Left jugular catheter tip is in the left innominate vein near the junction with the superior vena cava. No pneumothorax. There is atelectatic change in the right mid lung. There is a small focus of airspace consolidation in  the medial right base. Lungs elsewhere are clear. There is cardiomegaly with pulmonary vascularity normal. No adenopathy. No bone  lesions. IMPRESSION: Tube and catheter positions as described without pneumothorax. Small focus of consolidation medial right base, felt to represent a focal area of pneumonia. Atelectasis right mid lung. Left lung clear. Heart mildly enlarged. Electronically Signed   By: Lowella Grip III M.D.   On: 10/13/2018 23:19   Dg Chest Port 1 View  Result Date: 10/13/2018 CLINICAL DATA:  57 year old female with respiratory failure status post cardiac arrest. EXAM: PORTABLE CHEST 1 VIEW COMPARISON:  10/12/2018 and earlier. FINDINGS: Portable AP semi upright view at 0504 hours. Stable endotracheal tube tip just below the clavicles. Enteric tube courses to the left abdomen, tip not included. Bilateral IJ approach central lines remain in place. Pacer or resuscitation pads project over the left chest. Mildly lower lung volumes and more kyphotic view. Stable cardiac size and mediastinal contours. Coarse and indistinct bilateral pulmonary interstitial opacity persists. No pneumothorax or definite pleural effusion. Paucity of bowel gas in the upper abdomen. IMPRESSION: 1.  Stable lines and tubes. 2. Persistent coarse bilateral pulmonary interstitial opacity. Top differential considerations include interstitial edema, atypical respiratory infection, ARDS. No pleural effusion is evident. Electronically Signed   By: Genevie Ann M.D.   On: 10/13/2018 07:45   Dg Chest Port 1 View  Result Date: 10/12/2018 CLINICAL DATA:  Hypoxia EXAM: PORTABLE CHEST 1 VIEW COMPARISON:  October 12, 2018 FINDINGS: Endotracheal tube tip is 5.5 cm above the carina. Nasogastric tube tip and side port are below the diaphragm. Right central catheter tip is in the superior vena cava. Left central catheter tip is in left innominate vein. No pneumothorax. There is no edema or consolidation. Heart is mildly enlarged with pulmonary vascularity normal. No adenopathy. No evident bone lesions. IMPRESSION: Tube and catheter positions as described without  pneumothorax. No edema or consolidation. Mild cardiac enlargement. Electronically Signed   By: Lowella Grip III M.D.   On: 10/12/2018 20:07   Dg Chest Port 1 View  Result Date: 10/12/2018 CLINICAL DATA:  Central line placement. EXAM: PORTABLE CHEST 1 VIEW COMPARISON:  10/12/2018. FINDINGS: Interim placement of right IJ line. Its tip is over the superior vena cava. Left IJ line in stable position. Endotracheal tube and NG tube in stable position. Cardiomegaly. Pulmonary vascular prominence. Mild right base infiltrate. No pleural effusion or pneumothorax. IMPRESSION: 1. Interim placement right IJ line, its tip is over the superior vena cava. Endotracheal tube, NG tube, left IJ line stable position. 2.  Cardiomegaly with pulmonary venous congestion. 3.  Right base infiltrate. Electronically Signed   By: Marcello Moores  Register   On: 10/12/2018 17:04   Dg Chest Port 1 View  Result Date: 10/12/2018 CLINICAL DATA:  Intubation.  Respiratory failure. EXAM: PORTABLE CHEST 1 VIEW COMPARISON:  10/09/2018. FINDINGS: Endotracheal tube has been withdrawn and its tip is now 2.7 cm above the carina. NG tube noted with tip below left hemidiaphragm. Left IJ line noted with tip over the proximal SVC. Cardiomegaly with bilateral pulmonary venous congestion and infiltrates/edema. No pleural effusion or pneumothorax. IMPRESSION: 1. Endotracheal tube has been withdrawn, its tip is now 2.7 cm above the carina. NG tube and left IJ line stable position. 2. Cardiomegaly with bilateral pulmonary infiltrates/edema. Small left pleural effusion. Similar findings noted on prior exam. Electronically Signed   By: [place]   On: 10/12/2018 10:51   Dg Chest Port 1 View  Result Date: 10/09/2018 CLINICAL DATA:  Respiratory failure EXAM: PORTABLE CHEST 1 VIEW COMPARISON:  Yesterday FINDINGS: Endotracheal tube tip projects 13 mm above the carina, lower than before. An orogastric tube at least reaches the diaphragm. Left IJ line with  tip the distal left brachiocephalic. Symmetric increased opacity in the bilateral perihilar and lower chest. No Kerley lines or effusion. No pneumothorax. There is cardiopericardial enlargement. IMPRESSION: Lower endotracheal tube, tip 13 mm above the carina. Symmetric worsening aeration, likely edema. Electronically Signed   By: Monte Fantasia M.D.   On: 10/09/2018 10:02   Dg Chest Port 1 View  Result Date: 10/07/2018 CLINICAL DATA:  Central line placement EXAM: PORTABLE CHEST 1 VIEW COMPARISON:  10/07/2018 FINDINGS: Left central line remains in the upper mediastinum near the confluence of the innominate veins, unchanged. No pneumothorax. Endotracheal tube is 4 cm above the carina. NG tube enters the stomach. Cardiomegaly. No confluent opacities or edema. No effusions or acute bony abnormality. IMPRESSION: Left central line tip remains near the confluence of the innominate veins. No pneumothorax. Cardiomegaly.  No overt edema. Electronically Signed   By: Rolm Baptise M.D.   On: 10/07/2018 21:29   Dg Chest Portable 1 View  Result Date: 10/07/2018 CLINICAL DATA:  Central line placement. EXAM: PORTABLE CHEST 1 VIEW COMPARISON:  Earlier this day at 2019 hour FINDINGS: Tip of the left internal jugular central venous catheter in the region of the brachiocephalic/SVC confluence. No visualized pneumothorax, however the lung apices are not entirely included in the field of view. Endotracheal tube tip at the thoracic inlet 4.7 cm from the carina. Enteric tube in place with tip below the diaphragm. Low lung volumes persist. Unchanged cardiomegaly. Vascular congestion is similar. No large pleural effusion. IMPRESSION: 1. Tip of the left internal jugular central venous catheter in the region of the brachiocephalic/SVC confluence. No visualized pneumothorax. 2. Low lung volumes with unchanged cardiomegaly and vascular congestion. Electronically Signed   By: Keith Rake M.D.   On: 10/07/2018 21:29   Dg Chest  Portable 1 View  Result Date: 10/07/2018 CLINICAL DATA:  Post intubation EXAM: PORTABLE CHEST 1 VIEW COMPARISON:  10/07/2018 FINDINGS: Endotracheal tube is 2 cm above the carina. NG tube enters the stomach. Cardiomegaly with vascular congestion, similar to prior study. No confluent opacities, effusions or pneumothorax. No definite visible rib fracture as questioned on prior chest x-ray. IMPRESSION: Endotracheal tube 2 cm above the carina. Mild cardiomegaly, vascular congestion. Electronically Signed   By: Rolm Baptise M.D.   On: 10/07/2018 20:39   Dg Chest Portable 1 View  Result Date: 10/07/2018 CLINICAL DATA:  CPR EXAM: PORTABLE CHEST 1 VIEW COMPARISON:  05/12/2018 FINDINGS: Heart size is mildly enlarged. Mild vascular congestion. No confluent opacities, effusions or pneumothorax. Suspect left lateral rib fractures, but difficult to visualize. IMPRESSION: Cardiomegaly, vascular congestion.  No effusion or pneumothorax. Suspect left lateral rib fractures, but difficult to visualize and characterize. Electronically Signed   By: Rolm Baptise M.D.   On: 10/07/2018 19:56   Vas [place] Upper Extremity Arterial Duplex  Result Date: 09/17/2018 UPPER EXTREMITY DUPLEX STUDY Indications: Preoperative exam.  Comparison Study: No prior exam Performing Technologist: Alvia Grove RVT  Examination Guidelines: A complete evaluation includes B-mode imaging, spectral Doppler, color Doppler, and power Doppler as needed of all accessible portions of each vessel. Bilateral testing is considered an integral part of a complete examination. Limited examinations for reoccurring indications may be performed as noted.  Right Pre-Dialysis Findings: +-----------------------+----------+--------------------+---------+--------+ Location  PSV (cm/s)Intralum. Diam. (cm)Waveform Comments +-----------------------+----------+--------------------+---------+--------+ Brachial Antecub. [id number]        0.47                 triphasic         +-----------------------+----------+--------------------+---------+--------+ Radial Art at Wrist    90        0.27                triphasic         +-----------------------+----------+--------------------+---------+--------+ Ulnar Art at Wrist     40        0.14                triphasic         +-----------------------+----------+--------------------+---------+--------+ +------------+-----------------+ Allen's Testbranches at fossa +------------+-----------------+ Left Pre-Dialysis Findings: +-----------------------+----------+--------------------+---------+---------+ Location               PSV (cm/s)Intralum. Diam. (cm)Waveform Comments  +-----------------------+----------+--------------------+---------+---------+ Brachial Antecub. fossa84        0.51                triphasic          +-----------------------+----------+--------------------+---------+---------+ Radial Art at Wrist    100       0.25                triphasic          +-----------------------+----------+--------------------+---------+---------+ Ulnar Art at Wrist                                   triphasictoo small +-----------------------+----------+--------------------+---------+---------+ +------------+-----------------+ Allen's Testbranches at fossa +------------+-----------------+  Final Interpretation:   Measurements above. *See table(s) above for measurements and observations. Electronically signed by Harold Barban MD on 09/17/2018 at 12:26:33 PM.    Final    Vas [place] Lower Extremity Venous (dvt)  Result Date: 10/08/2018  Lower Venous Study Indications: Edema.  Limitations: Poor ultrasound/tissue interface, body habitus and patient positioning. Performing Technologist: Abram Sander  Examination Guidelines: A complete evaluation includes B-mode imaging, spectral Doppler, color Doppler, and power Doppler as needed of all accessible portions of each vessel. Bilateral testing is  considered an integral part of a complete examination. Limited examinations for reoccurring indications may be performed as noted.  Right Venous Findings: +---------+---------------+---------+-----------+----------+--------------+          CompressibilityPhasicitySpontaneityPropertiesSummary        +---------+---------------+---------+-----------+----------+--------------+ CFV      Full           Yes      Yes                                 +---------+---------------+---------+-----------+----------+--------------+ SFJ      Full                                                        +---------+---------------+---------+-----------+----------+--------------+ FV Prox  Full                                                        +---------+---------------+---------+-----------+----------+--------------+ FV Mid   Full                                                        +---------+---------------+---------+-----------+----------+--------------+  FV DistalFull                                                        +---------+---------------+---------+-----------+----------+--------------+ PFV      Full                                                        +---------+---------------+---------+-----------+----------+--------------+ POP      Full           Yes      Yes                                 +---------+---------------+---------+-----------+----------+--------------+ PTV      Full                                                        +---------+---------------+---------+-----------+----------+--------------+ PERO                                                  not visualized +---------+---------------+---------+-----------+----------+--------------+  Left Venous Findings: +---------+---------------+---------+-----------+----------+--------------+          CompressibilityPhasicitySpontaneityPropertiesSummary         +---------+---------------+---------+-----------+----------+--------------+ CFV      Full           Yes      Yes                                 +---------+---------------+---------+-----------+----------+--------------+ SFJ                                                   not visualized +---------+---------------+---------+-----------+----------+--------------+ FV Prox  Full                                                        +---------+---------------+---------+-----------+----------+--------------+ FV Mid   Full                                                        +---------+---------------+---------+-----------+----------+--------------+ FV DistalFull                                                        +---------+---------------+---------+-----------+----------+--------------+  PFV      Full                                                        +---------+---------------+---------+-----------+----------+--------------+ POP      Full           Yes      Yes                                 +---------+---------------+---------+-----------+----------+--------------+ PTV      Full                                                        +---------+---------------+---------+-----------+----------+--------------+ PERO                                                  not visualized +---------+---------------+---------+-----------+----------+--------------+    Summary: Right: There is no evidence of deep vein thrombosis in the lower extremity. However, portions of this examination were limited- see technologist comments above. No cystic structure found in the popliteal fossa. Left: There is no evidence of deep vein thrombosis in the lower extremity. However, portions of this examination were limited- see technologist comments above. No cystic structure found in the popliteal fossa.  *See table(s) above for measurements and observations. Electronically  signed by Ruta Hinds MD on 10/08/2018 at 8:38:38 PM.    Final    Vas [place] Upper Ext Vein Mapping (pre-op Avf)  Result Date: 09/17/2018 UPPER EXTREMITY VEIN MAPPING  Indications: Pre-access. Performing Technologist: Alvia Grove RVT  Examination Guidelines: A complete evaluation includes B-mode imaging, spectral Doppler, color Doppler, and power Doppler as needed of all accessible portions of each vessel. Bilateral testing is considered an integral part of a complete examination. Limited examinations for reoccurring indications may be performed as noted. +-----------------+-------------+----------+--------+ Right Cephalic   Diameter (cm)Depth (cm)Findings +-----------------+-------------+----------+--------+ Shoulder             0.19                        +-----------------+-------------+----------+--------+ Prox upper arm       0.16                        +-----------------+-------------+----------+--------+ Mid upper arm        0.19                        +-----------------+-------------+----------+--------+ Dist upper arm    0.16 / .34                     +-----------------+-------------+----------+--------+ Antecubital fossa    0.62               thrombus +-----------------+-------------+----------+--------+ Prox forearm         0.22                        +-----------------+-------------+----------+--------+  Mid forearm          0.23                        +-----------------+-------------+----------+--------+ Dist forearm         0.19                        +-----------------+-------------+----------+--------+ +-----------------+-------------+----------+--------+ Right Basilic    Diameter (cm)Depth (cm)Findings +-----------------+-------------+----------+--------+ Mid upper arm        31.00               joins   +-----------------+-------------+----------+--------+ Dist upper arm       0.28                         +-----------------+-------------+----------+--------+ Antecubital fossa 0.20 / 0.28                    +-----------------+-------------+----------+--------+ +-----------------+-------------+----------+--------------+ Left Cephalic    Diameter (cm)Depth (cm)   Findings    +-----------------+-------------+----------+--------------+ Shoulder             0.37                              +-----------------+-------------+----------+--------------+ Prox upper arm       0.37                 branching    +-----------------+-------------+----------+--------------+ Mid upper arm        0.35                 branching    +-----------------+-------------+----------+--------------+ Dist upper arm       0.39                              +-----------------+-------------+----------+--------------+ Antecubital fossa0.29 / 0.513                          +-----------------+-------------+----------+--------------+ Prox forearm                            not visualized +-----------------+-------------+----------+--------------+ Mid forearm          0.25                              +-----------------+-------------+----------+--------------+ Dist forearm      0.17 / 0.24                          +-----------------+-------------+----------+--------------+ +-----------------+-------------+----------+--------------+ Left Basilic     Diameter (cm)Depth (cm)   Findings    +-----------------+-------------+----------+--------------+ Prox upper arm   0.61 / 0.267               joins      +-----------------+-------------+----------+--------------+ Mid upper arm        0.16                              +-----------------+-------------+----------+--------------+ Dist upper arm       0.16                              +-----------------+-------------+----------+--------------+  Antecubital fossa0.19 / 0.513                           +-----------------+-------------+----------+--------------+ Prox forearm                            not visualized +-----------------+-------------+----------+--------------+ Distal forearm      / 0.24                             +-----------------+-------------+----------+--------------+ Final Interpretation:   Measurements above. Thrombus visualized in the right cephalic vein  at the level of the antecubital fossa, age undetermined. *See table(s) above for measurements and observations.  Diagnosing physician: Harold Barban MD Electronically signed by Harold Barban MD on 09/17/2018 at 12:26:59 PM.    Final     Labs:  CBC: Recent Labs    10/14/18 0421 10/15/18 0405 10/16/18 0519 10/17/18 0414  WBC 12.3* 13.1* 12.7* 19.5*  HGB 8.3* 8.5* 9.0* 9.4*  HCT 31.3* 31.8* 33.7* 35.2*  PLT 201 196 203 214    COAGS: Recent Labs    10/07/18 1947  10/07/18 2256 10/08/18 0618  10/14/18 0421 10/15/18 0405 10/16/18 0519 10/17/18 0414  INR 1.08  --  1.13 1.13  --   --   --   --   --   APTT  --    < > 29 43*   < > 110* 110* 167* 137*   < > = values in this interval not displayed.    BMP: Recent Labs    10/16/18 0519 10/16/18 1621 10/17/18 0414 10/17/18 0728  NA 137 137 138 137  K 4.4 4.4 4.4 4.2  CL 102 102 101 100  CO2 26 23 24 24   GLUCOSE 210* 191* 232* 240*  245*  BUN 48* 46* 44* 50*  CALCIUM 10.4* 10.4* 10.7* 10.6*  CREATININE 2.33* 2.26* 2.37* 2.69*  GFRNONAA 22* 23* 21* 18*  GFRAA 25* 26* 25* 21*    LIVER FUNCTION TESTS: Recent Labs    05/14/18 0334  10/07/18 1947 10/12/18 2013  10/16/18 0519 10/16/18 1621 10/17/18 0414 10/17/18 0728  BILITOT 0.8  --  0.5 0.6  --   --   --   --   --   AST 12*  --  52* 75*  --   --   --   --   --   ALT 10*  --  25 42  --   --   --   --   --   ALKPHOS 72  --  79 94  --   --   --   --   --   PROT 7.3  --  7.4 6.8  --   --   --   --   --   ALBUMIN 2.9*   < > 3.2* 2.3*   < > 2.1* 2.3* 2.3* 2.3*   < > = values in this  interval not displayed.    TUMOR MARKERS: No results for input(s): AFPTM, CEA, CA199, CHROMGRNA in the last 8760 hours.  Assessment and Plan:  Cardiac arrest Continued PEAs--- even this am ARF- temp cath placed 10/25 CCM- Rt IJ Need ro long term use Request for tunneled conversion Tmax 101.3 and wbdc 19.5 Will keep on IR Radar We will place tunneled cath when appropriate Risks and benefits discussed with the patient's dtr at bedside including, but  not limited to bleeding, infection, vascular injury, pneumothorax which may require chest tube placement, air embolism or even death  All of the patient's daughter questions were answered, family is agreeable to proceed. Consent signed and in chart.   Thank you for this interesting consult.  I greatly enjoyed meeting Catherine Williams and look forward to participating in their care.  A copy of this report was sent to the requesting provider on this date.  Electronically Signed: Lavonia Drafts, PA-C 10/17/2018, 11:27 AM   I spent a total of 20 Minutes    in face to face in clinical consultation, greater than 50% of which was counseling/coordinating care for temp to tunneled HD conversion

## 2018-10-17 NOTE — Progress Notes (Signed)
At 0435 CRRT machine began to alarm high access pressure. TMP and filter pressure shot up into the 200s, RN tried multiple attempts to clear the alarms and get the machine to run smoothly. Machine pressures would not come down and filter clotted at 0445. Blood was not returned.   Patients VS stable. Will continue to monitor.   Catherine Williams

## 2018-10-17 NOTE — Plan of Care (Signed)
  Problem: Clinical Measurements: Goal: Ability to maintain clinical measurements within normal limits will improve Outcome: Progressing Goal: Will remain free from infection Outcome: Progressing Goal: Respiratory complications will improve Outcome: Progressing Goal: Cardiovascular complication will be avoided Outcome: Progressing   Problem: Nutrition: Goal: Adequate nutrition will be maintained Outcome: Progressing   Problem: Coping: Goal: Level of anxiety will decrease Outcome: Progressing   Problem: Elimination: Goal: Will not experience complications related to bowel motility Outcome: Progressing Goal: Will not experience complications related to urinary retention Outcome: Progressing   Problem: Pain Managment: Goal: General experience of comfort will improve Outcome: Progressing   Problem: Clinical Measurements: Goal: Ability to maintain clinical measurements within normal limits will improve 10/17/2018 2311 by Creig Hines, RN Outcome: Progressing 10/17/2018 2311 by Creig Hines, RN Outcome: Progressing Goal: Will remain free from infection 10/17/2018 2311 by Creig Hines, RN Outcome: Progressing 10/17/2018 2311 by Creig Hines, RN Outcome: Progressing Goal: Diagnostic test results will improve Outcome: Progressing Goal: Respiratory complications will improve 10/17/2018 2311 by Creig Hines, RN Outcome: Progressing 10/17/2018 2311 by Creig Hines, RN Outcome: Progressing Goal: Cardiovascular complication will be avoided 10/17/2018 2311 by Creig Hines, RN Outcome: Progressing 10/17/2018 2311 by Creig Hines, RN Outcome: Progressing   Problem: Nutrition: Goal: Adequate nutrition will be maintained 10/17/2018 2311 by Creig Hines, RN Outcome: Progressing 10/17/2018 2311 by Creig Hines, RN Outcome: Progressing   Problem: Coping: Goal: Level of anxiety will decrease 10/17/2018 2311 by Creig Hines, RN Outcome:  Progressing 10/17/2018 2311 by Creig Hines, RN Outcome: Progressing   Problem: Elimination: Goal: Will not experience complications related to bowel motility Outcome: Progressing Goal: Will not experience complications related to urinary retention Outcome: Progressing   Problem: Pain Managment: Goal: General experience of comfort will improve Outcome: Progressing

## 2018-10-18 ENCOUNTER — Inpatient Hospital Stay (HOSPITAL_COMMUNITY): Payer: Medicare Other

## 2018-10-18 LAB — RENAL FUNCTION PANEL
ALBUMIN: 2.1 g/dL — AB (ref 3.5–5.0)
ALBUMIN: 2 g/dL — AB (ref 3.5–5.0)
ANION GAP: 14 (ref 5–15)
Anion gap: 11 (ref 5–15)
BUN: 80 mg/dL — AB (ref 6–20)
BUN: 98 mg/dL — ABNORMAL HIGH (ref 6–20)
CALCIUM: 10.4 mg/dL — AB (ref 8.9–10.3)
CHLORIDE: 100 mmol/L (ref 98–111)
CO2: 24 mmol/L (ref 22–32)
CO2: 23 mmol/L (ref 22–32)
CREATININE: 4.24 mg/dL — AB (ref 0.44–1.00)
Calcium: 10.8 mg/dL — ABNORMAL HIGH (ref 8.9–10.3)
Chloride: 100 mmol/L (ref 98–111)
Creatinine, Ser: 5.21 mg/dL — ABNORMAL HIGH (ref 0.44–1.00)
GFR calc Af Amer: 12 mL/min — ABNORMAL LOW (ref >60)
GFR calc Af Amer: 10 mL/min — ABNORMAL LOW (ref >60)
GFR calc non Af Amer: 8 mL/min — ABNORMAL LOW (ref >60)
GFR, EST NON AFRICAN AMERICAN: 11 mL/min — AB (ref >60)
GLUCOSE: 277 mg/dL — AB (ref 70–99)
Glucose, Bld: 143 mg/dL — ABNORMAL HIGH (ref 70–99)
PHOSPHORUS: 3.2 mg/dL (ref 2.5–4.6)
PHOSPHORUS: 3.4 mg/dL (ref 2.5–4.6)
POTASSIUM: 4.1 mmol/L (ref 3.5–5.1)
Potassium: 3.9 mmol/L (ref 3.5–5.1)
SODIUM: 135 mmol/L (ref 135–145)
Sodium: 137 mmol/L (ref 135–145)

## 2018-10-18 LAB — GLUCOSE, CAPILLARY
GLUCOSE-CAPILLARY: 234 mg/dL — AB (ref 70–99)
GLUCOSE-CAPILLARY: 235 mg/dL — AB (ref 70–99)
GLUCOSE-CAPILLARY: 210 mg/dL — AB (ref 70–99)
GLUCOSE-CAPILLARY: 233 mg/dL — AB (ref 70–99)
GLUCOSE-CAPILLARY: 220 mg/dL — AB (ref 70–99)
GLUCOSE-CAPILLARY: 157 mg/dL — AB (ref 70–99)
GLUCOSE-CAPILLARY: 147 mg/dL — AB (ref 70–99)
Glucose-Capillary: 122 mg/dL — ABNORMAL HIGH (ref 70–99)
Glucose-Capillary: 145 mg/dL — ABNORMAL HIGH (ref 70–99)
Glucose-Capillary: 150 mg/dL — ABNORMAL HIGH (ref 70–99)
Glucose-Capillary: 155 mg/dL — ABNORMAL HIGH (ref 70–99)
Glucose-Capillary: 164 mg/dL — ABNORMAL HIGH (ref 70–99)
Glucose-Capillary: 186 mg/dL — ABNORMAL HIGH (ref 70–99)
Glucose-Capillary: 197 mg/dL — ABNORMAL HIGH (ref 70–99)
Glucose-Capillary: 244 mg/dL — ABNORMAL HIGH (ref 70–99)
Glucose-Capillary: 245 mg/dL — ABNORMAL HIGH (ref 70–99)

## 2018-10-18 LAB — APTT: aPTT: 32 seconds (ref 24–36)

## 2018-10-18 LAB — CBC WITH DIFFERENTIAL/PLATELET
Abs Immature Granulocytes: 1.15 10*3/uL — ABNORMAL HIGH (ref 0.00–0.07)
Basophils Absolute: 0.1 10*3/uL (ref 0.0–0.1)
Basophils Relative: 0 %
EOS ABS: 0.3 10*3/uL (ref 0.0–0.5)
EOS PCT: 2 %
HEMATOCRIT: 31.5 % — AB (ref 36.0–46.0)
HEMOGLOBIN: 8.8 g/dL — AB (ref 12.0–15.0)
Immature Granulocytes: 5 %
LYMPHS ABS: 3.2 10*3/uL (ref 0.7–4.0)
LYMPHS PCT: 14 %
MCH: 25.5 pg — ABNORMAL LOW (ref 26.0–34.0)
MCHC: 27.9 g/dL — AB (ref 30.0–36.0)
MCV: 91.3 fL (ref 80.0–100.0)
MONOS PCT: 9 %
Monocytes Absolute: 2 10*3/uL — ABNORMAL HIGH (ref 0.1–1.0)
NRBC: 1.1 % — AB (ref 0.0–0.2)
Neutro Abs: 15.5 10*3/uL — ABNORMAL HIGH (ref 1.7–7.7)
Neutrophils Relative %: 70 %
Platelets: 196 10*3/uL (ref 150–400)
RBC: 3.45 MIL/uL — ABNORMAL LOW (ref 3.87–5.11)
RDW: 15.5 % (ref 11.5–15.5)
WBC: 22.2 10*3/uL — ABNORMAL HIGH (ref 4.0–10.5)

## 2018-10-18 LAB — BLOOD GAS, ARTERIAL
Acid-base deficit: 1.4 mmol/L (ref 0.0–2.0)
BICARBONATE: 24.4 mmol/L (ref 20.0–28.0)
Drawn by: 252031
FIO2: 40
LHR: 20 {breaths}/min
O2 SAT: 99 %
PATIENT TEMPERATURE: 98.6
PEEP/CPAP: 5 cmH2O
VT: 380 mL
pCO2 arterial: 52.9 mmHg — ABNORMAL HIGH (ref 32.0–48.0)
pH, Arterial: 7.286 — ABNORMAL LOW (ref 7.350–7.450)
pO2, Arterial: 161 mmHg — ABNORMAL HIGH (ref 83.0–108.0)

## 2018-10-18 LAB — MAGNESIUM: MAGNESIUM: 2.7 mg/dL — AB (ref 1.7–2.4)

## 2018-10-18 LAB — PHOSPHORUS: Phosphorus: 3.2 mg/dL (ref 2.5–4.6)

## 2018-10-18 MED ORDER — INSULIN ASPART 100 UNIT/ML ~~LOC~~ SOLN
1.0000 [IU] | SUBCUTANEOUS | Status: DC
  Administered 2018-10-18: 1 [IU] via SUBCUTANEOUS
  Administered 2018-10-18: 2 [IU] via SUBCUTANEOUS
  Administered 2018-10-19 (×2): 3 [IU] via SUBCUTANEOUS

## 2018-10-18 MED ORDER — SODIUM CHLORIDE 0.9 % IV SOLN
1.0000 g | INTRAVENOUS | Status: DC
  Administered 2018-10-18: 1 g via INTRAVENOUS
  Filled 2018-10-18 (×2): qty 10

## 2018-10-18 MED ORDER — INSULIN REGULAR(HUMAN) IN NACL 100-0.9 UT/100ML-% IV SOLN
INTRAVENOUS | Status: DC
  Administered 2018-10-18: 1.9 [IU]/h via INTRAVENOUS
  Filled 2018-10-18: qty 100

## 2018-10-18 MED ORDER — SODIUM CHLORIDE 0.9 % IV SOLN
1.0000 g | INTRAVENOUS | Status: DC
  Filled 2018-10-18: qty 10

## 2018-10-18 NOTE — Plan of Care (Signed)
  Problem: Education: Goal: Knowledge of General Education information will improve Description Including pain rating scale, medication(s)/side effects and non-pharmacologic comfort measures Outcome: Progressing   Problem: Clinical Measurements: Goal: Ability to maintain clinical measurements within normal limits will improve Outcome: Progressing Goal: Diagnostic test results will improve Outcome: Progressing Goal: Respiratory complications will improve Outcome: Progressing Goal: Cardiovascular complication will be avoided Outcome: Progressing   Problem: Nutrition: Goal: Adequate nutrition will be maintained Outcome: Progressing   Problem: Coping: Goal: Level of anxiety will decrease Outcome: Progressing   Problem: Elimination: Goal: Will not experience complications related to bowel motility Outcome: Progressing   Problem: Pain Managment: Goal: General experience of comfort will improve Outcome: Progressing   Problem: Safety: Goal: Ability to remain free from injury will improve Outcome: Progressing  Patient denies pain by nodding no when asked if she is hurting. Patient now showing signs of pain. Patient is tolerating ventilator well.

## 2018-10-18 NOTE — Progress Notes (Signed)
No arrythmias noted overnight - creatinine continues to climb. Plans for tunneled HD cath. No plans for cath in this situation. If there is no chance of renal recovery at some point, could consider ischemia evaluation.  Will be available as needed and follow peripherally.  Pixie Casino, MD, [facility], [place] Director of the Advanced Lipid Disorders &  Cardiovascular Risk Reduction Clinic Diplomate of the American Board of Clinical Lipidology Attending Cardiologist  Direct Dial: [phone]  Fax: [phone]  Website:  [email or web address]

## 2018-10-18 NOTE — Brief Op Note (Signed)
Notified Dr. Elsworth Soho of pt stable glucose for the past 4 hours ranging in the 150's to 140's, Dr. Elsworth Soho to place new orders for insulin adminstration, This RN will wait until new orders are placed to change insulin settings

## 2018-10-18 NOTE — Progress Notes (Signed)
NAME:  Catherine Williams, MRN:  [id number], DOB:  10/23/1961, LOS: 73 ADMISSION DATE:  10/07/2018, CONSULTATION DATE:  10/07/2018 REFERRING MD:  Dr. Benjamine Mola , CHIEF COMPLAINT:  Cardiac Arrest    Brief History   57 year old female presents to ED on 10/20 s/p Witnessed Arrest. Family reports that patient was of normal state of health, this afternoon she was sitting on couch with daughter when she was noted to be breathing "funny", when daughter tired to stimulate patient she was unresponsive at 1830. EMS arrived 1840 and patient was noted to be pulseless. ROSC achieved at 1900 after 2 shocks and CPR. On arrival to ED patient remained unresponsive. LA 1.38. K 4.2. WBC 10.3. BP 174/80. Cardiology consulted. PCCM asked to admit.   Past Medical History  Diastolic HF (EF 81-01, B5ZW), COPD, PVD, Chronic Kidney Disease (Placement of Fistula on 10/17)   Significant Hospital Events   10/20 > Presents to ED s/p Cardiac Arrest  10/22 rewarmed at 1pm 10/24 - not awake enough to extubate but did follow commands  10/25 - sister from [place]. [place] at bedside. Daugther at bedside. Both very concerned she is not fully arousable. Per RN/family - can arouse to deep voice and track and nod and follow simple commands very briefly. Then falls asleep. Only 100cc./h urine so far. Per family - s/p LUE fistula recently in anticipation of future HD needs. Baseline creat 3.8s. Creat up at 4.41m%. Office renal doc -> Dr CMarval Regal    10/26 - 2 min PEA arrest with x  1 epi last night. Related to mucus plugging seen in ET tube via bronchoscopy. Sp BAL in RML Started on CRRT overnight -> This aM on CRRT, 40% fio2, PRVC and fentanyl and levophed gtt -> RASS -1 and followed simple commands. Seems weak 10/27 -  2L off with CRRT.  Off levophed 10/17/2018 status post second PEA arrest ? Mucus plugging , CRRT stopped, bronch unimpressive  Consults: date of consult/date signed off & final recs:  Cardiology 10/20 reconsult on  10/17/2018 PCCM 10/20  Procedures (surgical and bedside):  ETT 10/20 >> Left IJ 10/20 >>  Right IJ hemodialysis catheter 10/12/2018>> Significant Diagnostic Tests:  CXR 10/20 > Heart size is mildly enlarged. Mild vascular congestion. No confluent opacities, effusions or pneumothorax. Suspect left lateral rib fractures, but difficult to visualize. CT Head 10/20 >>neg ECHO 10/21 >> nml LVEF, gr 2 DD EEG 10/21 >> slowing. No epileptiform activity.  Venous doppler legs 10/21 > no evidence DVT HD cath 10/25 - start CRRT  Micro Data:  Blood 10/20 >>ng Sputum 10/20 >>ng Urine 10/28 >> 100,000 colonies of Serratia marcescens>> resp 10/25 >> ng   Antimicrobials:  Cefepime 10/28 >>10/31 ceftx 10/31 >>   SUBJECTIVE/OVERNIGHT/INTERVAL HX   Remains critically ill, on low-dose Levophed. CRRT is now off. On insulin drip due to persistent hyperglycemia   Objective   Blood pressure (!) 132/47, pulse 73, temperature 99 F (37.2 C), resp. rate (!) 24, height _0  (1.575 m), weight 130.4 kg, SpO2 100 %. CVP:  [6 mmHg-14 mmHg] 6 mmHg  Vent Mode: PRVC FiO2 (%):  [40 %] 40 % Set Rate:  [20 bmp-24 bmp] 24 bmp Vt Set:  [380 mL-400 mL] 400 mL PEEP:  [5 cmH20] 5 cmH20 Plateau Pressure:  [16 cmH20-20 cmH20] 20 cmH20   Intake/Output Summary (Last 24 hours) at 10/18/2018 1015 Last data filed at 10/18/2018 0900 Gross per 24 hour  Intake 1582.48 ml  Output -  Net  1582.48 ml   Filed Weights   10/16/18 0500 10/17/18 0600 10/18/18 0530  Weight: 132.9 kg 130.3 kg 130.4 kg   Obese, acutely ill, large tongue, orally intubated Decreased breath sounds bilateral S1-S2 normal, distant, no rub Soft obese abdomen Mild pallor, no icterus, no JVD 1+ edema Moves all 4 extremity, indicates she is thirsty and able to communicate.  Chest x-ray personally reviewed shows bibasilar atelectasis, no new infiltrates, some edema pattern       Assessment & Plan:   Acute Hypercarbic Respiratory  Failure suspected secondary to decompensated Heart Failure   H/O COPD, OSA on CPAP at HS  Recurrent respiratory arrest secondary to blood clots /mucous plugs -Plan for tracheostomy -Proceed with spontaneous breathing trials -Pulmonary hygiene -Hypertonic saline nebs   V.Fib/PEA Cardiac Arrest - elevated troponin as expected. No evidence of DVT.  Decompensated Heart Failure  H/O Diastolic HF (EF 45-80, D9IP) S/p repeat arrest 10/12/18 due to mucus plug Hypotension post PEA arrest --Titrate Levophed to off, systolic blood pressure goal 100 -May need ischemia evaluation eventually   Acute on Chronic Stage IV Kidney Disease  Baseline Crt high 3s. . Recent placement of UE Fistula . -Off CRRT, creatinine climbing, will likely need dialysis again, if off pressors in 24 hours consider intermittent dialysis   Fever ? HCAP , Serratia UTI -Rising leukocytosis -Change to ceftriaxone -DC central line  Hypoglycemia -resolved H/O DM CBG (last 3)  Recent Labs    10/18/18 0648 10/18/18 0810 10/18/18 0859  GLUCAP 245* 235* 210*    -On insulin drip, once CBGs less than 180, can transition off with Lantus and SSI resistant scale  Acute Encephalopathy  -Alert and orientated post PEA arrest on 10/17/2018  Constipation -Tube feeds -Laxative as needed  Disposition / Summary of Today's Plan 10/18/18   She has had respiratory arrest x2 and hence we will proceed with tracheostomy, this is the safest way forward.  On anticipate she will wean quickly to trach collar afterwards. Initial cause of cardiac arrest of unclear, may need ischemia evaluation eventually. She does appear to be dialysis dependent now, I have does not come off pressors in the next 24 we will need to reinstitute CRRT otherwise intermittent dialysis       Code Status: FC Family Communication: Daughters updated daily    The patient is critically ill with multiple organ systems failure and requires high complexity  decision making for assessment and support, frequent evaluation and titration of therapies, application of advanced monitoring technologies and extensive interpretation of multiple databases. Critical Care Time devoted to patient care services described in this note independent of APP/resident  time is 33 minutes.   Kara Mead MD. [place]. [place] [facility] Pager [phone] If no response call [phone]    10/18/2018, 10:15 AM

## 2018-10-18 NOTE — Progress Notes (Signed)
Patient ID: Catherine Williams, female   DOB: 1961-11-13, 57 y.o.   MRN: [id number]   Was seen and consented for conversion of Rt IJ temp cath to tunneled HD cath in IR But Leukocytosis and climbing Temp has come down to 99  Still has temp cath that can be used for dialysis  Cannot safely place tunneled cath with fever and high wbc Will keep on IR Radar

## 2018-10-19 ENCOUNTER — Encounter (HOSPITAL_COMMUNITY): Admission: RE | Admit: 2018-10-19 | Payer: Medicare Other | Source: Ambulatory Visit

## 2018-10-19 ENCOUNTER — Encounter (HOSPITAL_COMMUNITY)
Admission: EM | Disposition: A | Discharge status: Nursing Home (Includes ICF) | Payer: Self-pay | Source: Home / Self Care | Attending: Pulmonary Disease

## 2018-10-19 ENCOUNTER — Inpatient Hospital Stay (HOSPITAL_COMMUNITY): Payer: Medicare Other

## 2018-10-19 DIAGNOSIS — Z930 Tracheostomy status: Secondary | ICD-10-CM

## 2018-10-19 DIAGNOSIS — I2510 Atherosclerotic heart disease of native coronary artery without angina pectoris: Secondary | ICD-10-CM

## 2018-10-19 HISTORY — PX: LEFT HEART CATH AND CORONARY ANGIOGRAPHY: CATH118249

## 2018-10-19 LAB — MAGNESIUM
MAGNESIUM: 2.8 mg/dL — AB (ref 1.7–2.4)
Magnesium: 3.1 mg/dL — ABNORMAL HIGH (ref 1.7–2.4)

## 2018-10-19 LAB — CBC
HCT: 29.6 % — ABNORMAL LOW (ref 36.0–46.0)
HEMATOCRIT: 32 % — AB (ref 36.0–46.0)
HEMOGLOBIN: 8.7 g/dL — AB (ref 12.0–15.0)
Hemoglobin: 8.4 g/dL — ABNORMAL LOW (ref 12.0–15.0)
MCH: 25.4 pg — ABNORMAL LOW (ref 26.0–34.0)
MCH: 25.1 pg — AB (ref 26.0–34.0)
MCHC: 28.4 g/dL — ABNORMAL LOW (ref 30.0–36.0)
MCHC: 27.2 g/dL — ABNORMAL LOW (ref 30.0–36.0)
MCV: 89.4 fL (ref 80.0–100.0)
MCV: 92.2 fL (ref 80.0–100.0)
NRBC: 0.8 % — AB (ref 0.0–0.2)
NRBC: 2.5 % — AB (ref 0.0–0.2)
PLATELETS: 203 10*3/uL (ref 150–400)
Platelets: 228 10*3/uL (ref 150–400)
RBC: 3.31 MIL/uL — ABNORMAL LOW (ref 3.87–5.11)
RBC: 3.47 MIL/uL — AB (ref 3.87–5.11)
RDW: 15.3 % (ref 11.5–15.5)
RDW: 15.6 % — ABNORMAL HIGH (ref 11.5–15.5)
WBC: 21.2 10*3/uL — ABNORMAL HIGH (ref 4.0–10.5)
WBC: 23.7 10*3/uL — ABNORMAL HIGH (ref 4.0–10.5)

## 2018-10-19 LAB — GLUCOSE, CAPILLARY
GLUCOSE-CAPILLARY: 117 mg/dL — AB (ref 70–99)
Glucose-Capillary: 153 mg/dL — ABNORMAL HIGH (ref 70–99)
Glucose-Capillary: 217 mg/dL — ABNORMAL HIGH (ref 70–99)
Glucose-Capillary: 236 mg/dL — ABNORMAL HIGH (ref 70–99)

## 2018-10-19 LAB — POCT I-STAT 3, ART BLOOD GAS (G3+)
ACID-BASE DEFICIT: 3 mmol/L — AB (ref 0.0–2.0)
ACID-BASE DEFICIT: 10 mmol/L — AB (ref 0.0–2.0)
Acid-base deficit: 5 mmol/L — ABNORMAL HIGH (ref 0.0–2.0)
BICARBONATE: 24.2 mmol/L (ref 20.0–28.0)
Bicarbonate: 20.9 mmol/L (ref 20.0–28.0)
Bicarbonate: 14.7 mmol/L — ABNORMAL LOW (ref 20.0–28.0)
O2 SAT: 100 %
O2 Saturation: 100 %
O2 Saturation: 100 %
PO2 ART: 287 mmHg — AB (ref 83.0–108.0)
PO2 ART: 350 mmHg — AB (ref 83.0–108.0)
TCO2: 22 mmol/L (ref 22–32)
TCO2: 26 mmol/L (ref 22–32)
TCO2: 16 mmol/L — AB (ref 22–32)
pCO2 arterial: 41.7 mmHg (ref 32.0–48.0)
pCO2 arterial: 55.4 mmHg — ABNORMAL HIGH (ref 32.0–48.0)
pCO2 arterial: 28.8 mmHg — ABNORMAL LOW (ref 32.0–48.0)
pH, Arterial: 7.307 — ABNORMAL LOW (ref 7.350–7.450)
pH, Arterial: 7.249 — ABNORMAL LOW (ref 7.350–7.450)
pH, Arterial: 7.318 — ABNORMAL LOW (ref 7.350–7.450)
pO2, Arterial: 257 mmHg — ABNORMAL HIGH (ref 83.0–108.0)

## 2018-10-19 LAB — COMPREHENSIVE METABOLIC PANEL
ALK PHOS: 200 U/L — AB (ref 38–126)
ALT: 70 U/L — AB (ref 0–44)
AST: 72 U/L — ABNORMAL HIGH (ref 15–41)
Albumin: 2 g/dL — ABNORMAL LOW (ref 3.5–5.0)
Anion gap: 14 (ref 5–15)
BILIRUBIN TOTAL: 0.5 mg/dL (ref 0.3–1.2)
BUN: 108 mg/dL — ABNORMAL HIGH (ref 6–20)
CALCIUM: 10.3 mg/dL (ref 8.9–10.3)
CO2: 21 mmol/L — AB (ref 22–32)
CREATININE: 5.73 mg/dL — AB (ref 0.44–1.00)
Chloride: 99 mmol/L (ref 98–111)
GFR calc non Af Amer: 7 mL/min — ABNORMAL LOW (ref >60)
GFR, EST AFRICAN AMERICAN: 8 mL/min — AB (ref >60)
GLUCOSE: 342 mg/dL — AB (ref 70–99)
Potassium: 3.9 mmol/L (ref 3.5–5.1)
SODIUM: 134 mmol/L — AB (ref 135–145)
TOTAL PROTEIN: 7.2 g/dL (ref 6.5–8.1)

## 2018-10-19 LAB — RENAL FUNCTION PANEL
Albumin: 2 g/dL — ABNORMAL LOW (ref 3.5–5.0)
Anion gap: 15 (ref 5–15)
BUN: 109 mg/dL — ABNORMAL HIGH (ref 6–20)
CO2: 22 mmol/L (ref 22–32)
CREATININE: 5.59 mg/dL — AB (ref 0.44–1.00)
Calcium: 10.6 mg/dL — ABNORMAL HIGH (ref 8.9–10.3)
Chloride: 98 mmol/L (ref 98–111)
GFR calc Af Amer: 9 mL/min — ABNORMAL LOW (ref >60)
GFR calc non Af Amer: 8 mL/min — ABNORMAL LOW (ref >60)
GLUCOSE: 214 mg/dL — AB (ref 70–99)
PHOSPHORUS: 3.5 mg/dL (ref 2.5–4.6)
Potassium: 4 mmol/L (ref 3.5–5.1)
SODIUM: 135 mmol/L (ref 135–145)

## 2018-10-19 LAB — PROTIME-INR
INR: 1.21
INR: 1.24
PROTHROMBIN TIME: 15.2 s (ref 11.4–15.2)
PROTHROMBIN TIME: 15.5 s — AB (ref 11.4–15.2)

## 2018-10-19 LAB — TRIGLYCERIDES: TRIGLYCERIDES: 247 mg/dL — AB (ref <150)

## 2018-10-19 LAB — APTT: APTT: 31 s (ref 24–36)

## 2018-10-19 SURGERY — LEFT HEART CATH AND CORONARY ANGIOGRAPHY
Anesthesia: LOCAL

## 2018-10-19 MED ORDER — LIDOCAINE-PRILOCAINE 2.5-2.5 % EX CREA
1.0000 "application " | TOPICAL_CREAM | CUTANEOUS | Status: DC | PRN
  Filled 2018-10-19: qty 25

## 2018-10-19 MED ORDER — VECURONIUM BROMIDE 10 MG IV SOLR
10.0000 mg | Freq: Once | INTRAVENOUS | Status: AC
  Administered 2018-10-19: 10 mg via INTRAVENOUS
  Filled 2018-10-19: qty 10

## 2018-10-19 MED ORDER — PROPOFOL 500 MG/50ML IV EMUL
5.0000 ug/kg/min | Freq: Once | INTRAVENOUS | Status: AC
  Administered 2018-10-19: 5 ug/kg/min via INTRAVENOUS
  Filled 2018-10-19: qty 50

## 2018-10-19 MED ORDER — ALTEPLASE 2 MG IJ SOLR: 2.0000 mg | Freq: Once | INTRAMUSCULAR | Status: DC | PRN

## 2018-10-19 MED ORDER — SODIUM CHLORIDE 0.9 % IV SOLN: 250.0000 mL | INTRAVENOUS | Status: DC | PRN

## 2018-10-19 MED ORDER — SODIUM CHLORIDE 0.9% FLUSH: 3.0000 mL | Freq: Two times a day (BID) | INTRAVENOUS | Status: DC

## 2018-10-19 MED ORDER — SODIUM CHLORIDE 0.9 % IV SOLN
250.0000 mL | INTRAVENOUS | Status: DC | PRN
  Administered 2018-10-22: 250 mL via INTRAVENOUS

## 2018-10-19 MED ORDER — INSULIN REGULAR(HUMAN) IN NACL 100-0.9 UT/100ML-% IV SOLN
INTRAVENOUS | Status: DC
  Filled 2018-10-19: qty 100

## 2018-10-19 MED ORDER — SODIUM CHLORIDE 0.9 % WEIGHT BASED INFUSION: 1.0000 mL/kg/h | INTRAVENOUS | Status: DC

## 2018-10-19 MED ORDER — ETOMIDATE 2 MG/ML IV SOLN
40.0000 mg | Freq: Once | INTRAVENOUS | Status: AC
  Administered 2018-10-19: 20 mg via INTRAVENOUS
  Filled 2018-10-19: qty 20

## 2018-10-19 MED ORDER — SODIUM CHLORIDE 0.9 % IV SOLN: 100.0000 mL | INTRAVENOUS | Status: DC | PRN

## 2018-10-19 MED ORDER — INSULIN GLARGINE 100 UNIT/ML ~~LOC~~ SOLN
30.0000 [IU] | Freq: Every day | SUBCUTANEOUS | Status: DC
  Administered 2018-10-20 – 2018-11-01 (×13): 30 [IU] via SUBCUTANEOUS
  Filled 2018-10-19 (×14): qty 0.3

## 2018-10-19 MED ORDER — HEPARIN SODIUM (PORCINE) 1000 UNIT/ML DIALYSIS: 1000.0000 [IU] | INTRAMUSCULAR | Status: DC | PRN

## 2018-10-19 MED ORDER — SODIUM CHLORIDE 0.9 % IV SOLN
INTRAVENOUS | Status: AC
  Administered 2018-10-19: 19:00:00 via INTRAVENOUS

## 2018-10-19 MED ORDER — PROPOFOL 1000 MG/100ML IV EMUL
5.0000 ug/kg/min | INTRAVENOUS | Status: DC
  Administered 2018-10-19: 5 ug/kg/min via INTRAVENOUS
  Filled 2018-10-19: qty 1000

## 2018-10-19 MED ORDER — FENTANYL CITRATE (PF) 100 MCG/2ML IJ SOLN
200.0000 ug | Freq: Once | INTRAMUSCULAR | Status: AC
  Administered 2018-10-19: 100 ug via INTRAVENOUS
  Filled 2018-10-19: qty 4

## 2018-10-19 MED ORDER — LIDOCAINE HCL (PF) 1 % IJ SOLN
INTRAMUSCULAR | Status: AC
  Filled 2018-10-19: qty 30

## 2018-10-19 MED ORDER — ASPIRIN 81 MG PO CHEW: 81.0000 mg | CHEWABLE_TABLET | ORAL | Status: DC

## 2018-10-19 MED ORDER — HEPARIN (PORCINE) IN NACL 1000-0.9 UT/500ML-% IV SOLN
INTRAVENOUS | Status: DC | PRN
  Administered 2018-10-19 (×2): 500 mL

## 2018-10-19 MED ORDER — IOHEXOL 350 MG/ML SOLN
INTRAVENOUS | Status: DC | PRN
  Administered 2018-10-19: 80 mL via INTRA_ARTERIAL

## 2018-10-19 MED ORDER — SODIUM CHLORIDE 0.9% FLUSH: 3.0000 mL | INTRAVENOUS | Status: DC | PRN

## 2018-10-19 MED ORDER — FENTANYL CITRATE (PF) 100 MCG/2ML IJ SOLN
INTRAMUSCULAR | Status: DC | PRN
  Administered 2018-10-19: 25 ug via INTRAVENOUS

## 2018-10-19 MED ORDER — LIDOCAINE HCL (PF) 1 % IJ SOLN: 5.0000 mL | INTRAMUSCULAR | Status: DC | PRN

## 2018-10-19 MED ORDER — PROPOFOL 1000 MG/100ML IV EMUL
INTRAVENOUS | Status: AC
  Filled 2018-10-19: qty 100

## 2018-10-19 MED ORDER — SODIUM CHLORIDE 0.9 % IV SOLN
INTRAVENOUS | Status: AC | PRN
  Administered 2018-10-19: 10 mL/h via INTRAVENOUS

## 2018-10-19 MED ORDER — PENTAFLUOROPROP-TETRAFLUOROETH EX AERO: 1.0000 "application " | INHALATION_SPRAY | CUTANEOUS | Status: DC | PRN

## 2018-10-19 MED ORDER — LIDOCAINE HCL (PF) 1 % IJ SOLN
INTRAMUSCULAR | Status: DC | PRN
  Administered 2018-10-19: 20 mL

## 2018-10-19 MED ORDER — HEPARIN (PORCINE) IN NACL 1000-0.9 UT/500ML-% IV SOLN
INTRAVENOUS | Status: AC
  Filled 2018-10-19: qty 1000

## 2018-10-19 MED ORDER — MIDAZOLAM HCL 2 MG/2ML IJ SOLN
INTRAMUSCULAR | Status: AC
  Filled 2018-10-19: qty 2

## 2018-10-19 MED ORDER — MIDAZOLAM HCL 2 MG/2ML IJ SOLN
INTRAMUSCULAR | Status: DC | PRN
  Administered 2018-10-19: 1 mg via INTRAVENOUS

## 2018-10-19 MED ORDER — INSULIN ASPART 100 UNIT/ML ~~LOC~~ SOLN
6.0000 [IU] | Freq: Four times a day (QID) | SUBCUTANEOUS | Status: DC
  Administered 2018-10-20 – 2018-10-24 (×17): 6 [IU] via SUBCUTANEOUS

## 2018-10-19 MED ORDER — FENTANYL CITRATE (PF) 100 MCG/2ML IJ SOLN
INTRAMUSCULAR | Status: AC
  Filled 2018-10-19: qty 2

## 2018-10-19 MED ORDER — MIDAZOLAM HCL 2 MG/2ML IJ SOLN
4.0000 mg | Freq: Once | INTRAMUSCULAR | Status: AC
  Administered 2018-10-19: 2 mg via INTRAVENOUS
  Filled 2018-10-19: qty 4

## 2018-10-19 MED ORDER — SODIUM CHLORIDE 0.9% FLUSH
3.0000 mL | Freq: Two times a day (BID) | INTRAVENOUS | Status: DC
  Administered 2018-10-19 – 2018-10-22 (×6): 3 mL via INTRAVENOUS

## 2018-10-19 MED ORDER — INSULIN ASPART 100 UNIT/ML ~~LOC~~ SOLN
0.0000 [IU] | SUBCUTANEOUS | Status: DC
  Administered 2018-10-19: 7 [IU] via SUBCUTANEOUS
  Administered 2018-10-19: 4 [IU] via SUBCUTANEOUS
  Administered 2018-10-19: 7 [IU] via SUBCUTANEOUS
  Administered 2018-10-20: 4 [IU] via SUBCUTANEOUS
  Administered 2018-10-20 (×2): 3 [IU] via SUBCUTANEOUS
  Administered 2018-10-20 (×2): 4 [IU] via SUBCUTANEOUS
  Administered 2018-10-21: 7 [IU] via SUBCUTANEOUS
  Administered 2018-10-21: 4 [IU] via SUBCUTANEOUS
  Administered 2018-10-21 (×2): 7 [IU] via SUBCUTANEOUS
  Administered 2018-10-21 – 2018-10-22 (×5): 4 [IU] via SUBCUTANEOUS
  Administered 2018-10-22: 2 [IU] via SUBCUTANEOUS
  Administered 2018-10-22: 7 [IU] via SUBCUTANEOUS
  Administered 2018-10-22 – 2018-10-23 (×6): 4 [IU] via SUBCUTANEOUS
  Administered 2018-10-23 – 2018-10-24 (×3): 3 [IU] via SUBCUTANEOUS
  Administered 2018-10-24: 4 [IU] via SUBCUTANEOUS
  Administered 2018-10-25 – 2018-10-27 (×6): 3 [IU] via SUBCUTANEOUS
  Administered 2018-10-28: 4 [IU] via SUBCUTANEOUS
  Administered 2018-10-28 – 2018-10-29 (×5): 3 [IU] via SUBCUTANEOUS
  Administered 2018-10-30: 4 [IU] via SUBCUTANEOUS
  Administered 2018-10-31 – 2018-11-01 (×6): 3 [IU] via SUBCUTANEOUS

## 2018-10-19 SURGICAL SUPPLY — 9 items
CATH INFINITI 5FR MULTPACK ANG (CATHETERS) ×2 IMPLANT
HOVERMATT SINGLE USE (MISCELLANEOUS) ×2 IMPLANT
KIT HEART LEFT (KITS) ×2 IMPLANT
PACK CARDIAC CATHETERIZATION (CUSTOM PROCEDURE TRAY) ×2 IMPLANT
SHEATH PINNACLE 5F 10CM (SHEATH) ×2 IMPLANT
SHEATH PROBE COVER 6X72 (BAG) ×2 IMPLANT
TRANSDUCER W/STOPCOCK (MISCELLANEOUS) ×2 IMPLANT
TUBING CIL FLEX 10 FLL-RA (TUBING) ×2 IMPLANT
WIRE EMERALD 3MM-J .035X150CM (WIRE) ×2 IMPLANT

## 2018-10-19 NOTE — Progress Notes (Signed)
Spoke with Dr. Justin Mend with Nephrology concerning restarting CRRT tonight. The plan is to continue current care and hold off on CRRT tonight and will reassess in the morning.

## 2018-10-19 NOTE — Progress Notes (Signed)
Pt back to unit from cath lab, tolerated well

## 2018-10-19 NOTE — Procedures (Signed)
Bronchoscopy Procedure Note Catherine Williams [id number] Jun 02, 1961  Procedure: Bronchoscopy Indications: assist w/ Perc trach placement   Procedure Details Consent: Risks of procedure as well as the alternatives and risks of each were explained to the (patient/caregiver).  Consent for procedure obtained. Time Out: Verified patient identification, verified procedure, site/side was marked, verified correct patient position, special equipment/implants available, medications/allergies/relevent history reviewed, required imaging and test results available.  Performed  In preparation for procedure, patient was given 100% FiO2. Sedation: Benzodiazepines, Muscle relaxants and Etomidate  Procedure done by P Christan Defranco ACNP-BC, under direct supervision of Dr Nelda Marseille.  At first bronch was introduce through ET tube and structures of tracheal rings, carina identified for operator of tracheostomy who was Dr Nelda Marseille. Light of bronch passed through trachea and skin for indentification of tracheal rings for tracheostomy puncture. After this, under bronchoscopy guidance,  ET tube was pulled back sufficiently and very carefully. The ET tube was  pulled back enough to give room for tracheostomy operator and yet at same time to to ensure a secured airway. After this was accomplished, bronchoscope was withdrawn into the ET tube. After this,  Dr Nelda Marseille then performed tracheostomy under video visual provided by flexible video bronchoscopy. Followng introduction of tracheostomy,  the bronchoscope was removed from ET tube and introduced through tracheostomy. Correct position of tracheostomy was ensured, with enough room between carina and distal tracheostomy and no evidence of bleeding. The bronchoscope was then withdrawn. Respiratory therapist was then instructed to remove the ET tube.  Dr Nelda Marseille then proceeded to complete the tracheostomy with stay sutures   No complications   Evaluation Hemodynamic Status: BP stable  throughout; O2 sats: stable throughout Patient's Current Condition: stable Specimens:  None Complications: No apparent complications Patient did tolerate procedure well.   Catherine Williams 10/19/2018 Erick Colace ACNP-BC [place] Pager # [phone] OR # [phone] if no answer

## 2018-10-19 NOTE — Care Management Note (Addendum)
Case Management Note  Patient Details  Name: Catherine Williams MRN: [id number] Date of Birth: [birth year]  Subjective/Objective: 57yo female presented with s/p witnessed arrest.                 Action/Plan: CM consult acknowledged for home health. Patient was independent and lived at home PTA; has 3 adult daughters. Patient POD#0 Bronchoscopy with trach placement. Nephrology following with patient dialysis dependent per progress note; patient may benefit from an [facility] referral. Please enter order for [facility] referral if recommended post transition. CM will continue following for transitional needs.  Expected Discharge Date:  11/01/18               Expected Discharge Plan:  Long Term Acute Care (LTAC)  In-House Referral:  NA  Discharge planning Services  CM Consult  Post Acute Care Choice:  Long Term Acute Care [facility]) Choice offered to:  Adult Children  DME Arranged:  N/A DME Agency:  NA  HH Arranged:  NA HH Agency:  NA  Status of Service:  Completed, signed off  If discussed at [place] of Stay Meetings, dates discussed:  10/18/18, 10/23/18, 10/25/18, 10/30/18, 11/01/18  Additional Comments: 11/01/18 @ 1430-Nekeshia Lenhardt RNCM-CM received room information from [employer], Physicist, medical. Patient will transition to: Room 5e16; receiving physician: Dr. Laren Everts; Report to be called by nurse to: [phone]. Updates provided to [place].  11/01/18 @ 1036-Sherril Heyward RNCM-CM spoke to [employer], [employer], patient has an LTACH bed with HD available later today. Per Corrina, the primary nurse was made aware, with Dr. Daleen Bo to be updated to request DC summary. Corrina will update patients daughter and obtain consent.   10/30/18 @ 1017-Camylle Whicker RNCM-CM spoke to [employer], Physicist, medical. Patient has been approved for [facility] awaiting bed availability. Patient is #3 on their waiting list, with a tentative bed available on 10/31/18. CM updated Industrial/product designer [facility] Charge  Nurse) and will continue to follow.  10/26/18 @ 1350-Musa Rewerts RNCM-CM spoke to [employer], Physicist, medical. [facility] has no bed availability for this weekend; will f/u for an available bed early next week. CM updated Dr. Elsworth Soho via Epic secure chat. CM provided an update to patient's daughter [place]. CM will continue to follow.    10/25/18 @ 1507-Angelis Gates RNCM-CM spoke to [employer], Physicist, medical, with patient accepted once MD agreeable to transfer. POC discussed with Dr. Elsworth Soho with patient to trial 1 session of intermittent dialysis prior to transfer. CM will continue to follow.   10/23/18 @1226 -Midge Minium RNCM-CM consult for [facility] acknowledged. CM spoke to patient's daughter, Sherle Mello, to discuss LTACH options and provided preference with [facility] selected. CM answered daughters questions and provided support. LTACH referral given to [place], [place] liaison, to review for possible bed offer; updated liaison with daughter's contact number. CM will continue to follow.   Midge Minium RN, BSN, NCM-BC, ACM-RN [phone] 10/19/2018, 2:16 PM

## 2018-10-19 NOTE — Progress Notes (Signed)
RT responded to a code blue.  Pt manually ventilated until pulses returned.  Pt placed back on full vent support.

## 2018-10-19 NOTE — Procedures (Signed)
Percutaneous Tracheostomy Placement  Consent from family.  Patient sedated, paralyzed and position.  Placed on 100% FiO2 and RR matched.  Area cleaned and draped.  Lidocaine/epi injected.  Skin incision done followed by blunt dissection.  Trachea palpated then punctured, catheter passed and visualized bronchoscopically.  Wire placed and visualized.  Catheter removed.  Airway then entered and dilated.  Size 6 cuffed shiley trach placed and visualized bronchoscopically well above carina.  Good volume returns.  Patient tolerated the procedure well without complications.  Minimal blood loss.  CXR ordered and pending.  Jeryl Wilbourn G. Ioma Chismar, M.D. [place] [facility]. Pager: [phone]. After hours pager: [phone].  

## 2018-10-19 NOTE — Progress Notes (Signed)
Trach care done per RRT. IC was changed, reinserted, and locked back into place. Patient tolerated trach care without complications.

## 2018-10-19 NOTE — H&P (View-Only) (Signed)
DAILY PROGRESS NOTE   Patient Name: Catherine Williams Date of Encounter: 10/19/2018  Chief Complaint   Brady-asystolic arrest  Patient Profile   57 y.o. female with sudden arrest with long ROSC. No signs of acute MI with no ECG changes and negativeTroponin Preliminary TTE with normal EF severe LVH. She has had multiple, recurrent episodes of PEA arrest, including this morning - given EPI and noted to have NSVT/SVT afterwards on telemetry, but now in normal sinus rhythm.  Subjective   Called to the bedside for patient with recurrent arrest. This incident involved bradycardia and the patient was noted to have polymorphic VT. Reviewed with Dr. Curt Bears of EP - we are recommending an ischemia evaluation for possible coronary disease.  Objective   Vitals:   10/19/18 1430 10/19/18 1520 10/19/18 1548 10/19/18 1600  BP: (!) 130/42 (!) 154/74 (!) 111/40 110/61  Pulse: 76 (!) 147 73 76  Resp: (!) 30 (!) 30 (!) 38 (!) 35  Temp:      TempSrc:      SpO2: 100% 100% 100% 100%  Weight:      Height:        Intake/Output Summary (Last 24 hours) at 10/19/2018 1632 Last data filed at 10/19/2018 1600 Gross per 24 hour  Intake 1156.7 ml  Output 100 ml  Net 1056.7 ml   Filed Weights   10/18/18 0530 10/19/18 0500 10/19/18 1027  Weight: 130.4 kg 130.3 kg 130.3 kg    Physical Exam   General appearance: intubated, sedated, trached on vent Neck: no carotid bruit, no JVD and thyroid not enlarged, symmetric, no tenderness/mass/nodules Lungs: clear to auscultation bilaterally Heart: regular rate and rhythm Abdomen: soft, non-tender; bowel sounds normal; no masses,  no organomegaly Extremities: extremities normal, atraumatic, no cyanosis or edema Pulses: 2+ and symmetric Skin: Skin color, texture, turgor normal. No rashes or lesions Neurologic: Mental status: intubated, sedated, trached Psych: Cannot assess  Inpatient Medications    Scheduled Meds: . aspirin  81 mg Oral Daily  .  chlorhexidine gluconate (MEDLINE KIT)  15 mL Mouth Rinse BID  . Chlorhexidine Gluconate Cloth  6 each Topical Daily  . heparin injection (subcutaneous)  5,000 Units Subcutaneous Q8H  . insulin aspart  0-20 Units Subcutaneous Q4H  . insulin aspart  6 Units Subcutaneous Q6H  . insulin glargine  30 Units Subcutaneous Daily  . ipratropium-albuterol  3 mL Nebulization Q6H  . mouth rinse  15 mL Mouth Rinse 10 times per day  . multivitamin  15 mL Per Tube Daily  . pantoprazole sodium  40 mg Per Tube Daily  . rosuvastatin  10 mg Per Tube QPM  . sodium chloride flush  10-40 mL Intracatheter Q12H    Continuous Infusions: . sodium chloride Stopped (10/19/18 1445)  . sodium chloride    . sodium chloride    . cefTRIAXone (ROCEPHIN)  IV Stopped (10/18/18 2055)  . norepinephrine (LEVOPHED) Adult infusion 20 mcg/min (10/19/18 1600)    PRN Meds: sodium chloride, sodium chloride, acetaminophen (TYLENOL) oral liquid 160 mg/5 mL, alteplase, bisacodyl, fentaNYL (SUBLIMAZE) injection, heparin, lidocaine (PF), lidocaine-prilocaine, pentafluoroprop-tetrafluoroeth, sodium chloride flush   Labs   Results for orders placed or performed during the hospital encounter of 10/07/18 (from the past 48 hour(s))  Renal function panel (daily at 1600)     Status: Abnormal   Collection Time: 10/17/18  4:47 PM  Result Value Ref Range   Sodium 136 135 - 145 mmol/L   Potassium 4.1 3.5 - 5.1 mmol/L  Chloride 98 98 - 111 mmol/L   CO2 23 22 - 32 mmol/L   Glucose, Bld 303 (H) 70 - 99 mg/dL   BUN 65 (H) 6 - 20 mg/dL   Creatinine, Ser 3.42 (H) 0.44 - 1.00 mg/dL   Calcium 10.9 (H) 8.9 - 10.3 mg/dL   Phosphorus 3.1 2.5 - 4.6 mg/dL   Albumin 2.1 (L) 3.5 - 5.0 g/dL   GFR calc non Af Amer 14 (L) >60 mL/min   GFR calc Af Amer 16 (L) >60 mL/min    Comment: (NOTE) The eGFR has been calculated using the CKD EPI equation. This calculation has not been validated in all clinical situations. eGFR's persistently <60 mL/min signify  possible Chronic Kidney Disease.    Anion gap 15 5 - 15    Comment: Performed at [facility] [address], [place], [place] [address]  Glucose, capillary     Status: Abnormal   Collection Time: 10/17/18  8:48 PM  Result Value Ref Range   Glucose-Capillary 233 (H) 70 - 99 mg/dL   Comment 1 Arterial Specimen   Glucose, capillary     Status: Abnormal   Collection Time: 10/18/18 12:08 AM  Result Value Ref Range   Glucose-Capillary 244 (H) 70 - 99 mg/dL   Comment 1 Arterial Specimen   Renal function panel (daily at 0500)     Status: Abnormal   Collection Time: 10/18/18  2:58 AM  Result Value Ref Range   Sodium 135 135 - 145 mmol/L   Potassium 3.9 3.5 - 5.1 mmol/L   Chloride 100 98 - 111 mmol/L   CO2 24 22 - 32 mmol/L   Glucose, Bld 277 (H) 70 - 99 mg/dL   BUN 80 (H) 6 - 20 mg/dL   Creatinine, Ser 4.24 (H) 0.44 - 1.00 mg/dL   Calcium 10.4 (H) 8.9 - 10.3 mg/dL   Phosphorus 3.2 2.5 - 4.6 mg/dL   Albumin 2.1 (L) 3.5 - 5.0 g/dL   GFR calc non Af Amer 11 (L) >60 mL/min   GFR calc Af Amer 12 (L) >60 mL/min    Comment: (NOTE) The eGFR has been calculated using the CKD EPI equation. This calculation has not been validated in all clinical situations. eGFR's persistently <60 mL/min signify possible Chronic Kidney Disease.    Anion gap 11 5 - 15    Comment: Performed at [facility] [address], [place], [place] [address]  Magnesium     Status: Abnormal   Collection Time: 10/18/18  2:58 AM  Result Value Ref Range   Magnesium 2.7 (H) 1.7 - 2.4 mg/dL    Comment: Performed at [facility] [address], [place], [place] [address]  APTT     Status: None   Collection Time: 10/18/18  2:58 AM  Result Value Ref Range   aPTT 32 24 - 36 seconds    Comment: Performed at [facility] [address] [place], [place] [address]  CBC with Differential/Platelet     Status: Abnormal   Collection Time: 10/18/18  2:58 AM  Result Value Ref Range   WBC 22.2 (H) 4.0 -  10.5 K/uL   RBC 3.45 (L) 3.87 - 5.11 MIL/uL   Hemoglobin 8.8 (L) 12.0 - 15.0 g/dL   HCT 31.5 (L) 36.0 - 46.0 %   MCV 91.3 80.0 - 100.0 fL   MCH 25.5 (L) 26.0 - 34.0 pg   MCHC 27.9 (L) 30.0 - 36.0 g/dL  RDW 15.5 11.5 - 15.5 %   Platelets 196 150 - 400 K/uL   nRBC 1.1 (H) 0.0 - 0.2 %   Neutrophils Relative % 70 %   Neutro Abs 15.5 (H) 1.7 - 7.7 K/uL   Lymphocytes Relative 14 %   Lymphs Abs 3.2 0.7 - 4.0 K/uL   Monocytes Relative 9 %   Monocytes Absolute 2.0 (H) 0.1 - 1.0 K/uL   Eosinophils Relative 2 %   Eosinophils Absolute 0.3 0.0 - 0.5 K/uL   Basophils Relative 0 %   Basophils Absolute 0.1 0.0 - 0.1 K/uL   Immature Granulocytes 5 %   Abs Immature Granulocytes 1.15 (H) 0.00 - 0.07 K/uL    Comment: Performed at [facility], [address] [place], [place] [address]  Phosphorus     Status: None   Collection Time: 10/18/18  2:58 AM  Result Value Ref Range   Phosphorus 3.2 2.5 - 4.6 mg/dL    Comment: Performed at [facility], [address] [place], [place] [address]  Blood gas, arterial     Status: Abnormal   Collection Time: 10/18/18  3:35 AM  Result Value Ref Range   FIO2 40.00    Delivery systems VENTILATOR    Mode PRESSURE REGULATED VOLUME CONTROL    VT 380 mL   LHR 20 resp/min   Peep/cpap 5.0 cm H20   pH, Arterial 7.286 (L) 7.350 - 7.450   pCO2 arterial 52.9 (H) 32.0 - 48.0 mmHg   pO2, Arterial 161 (H) 83.0 - 108.0 mmHg   Bicarbonate 24.4 20.0 - 28.0 mmol/L   Acid-base deficit 1.4 0.0 - 2.0 mmol/L   O2 Saturation 99.0 %   Patient temperature 98.6    Collection site A-LINE    Drawn by [id number]    Sample type ARTERIAL DRAW    Allens test (pass/fail) PASS PASS  Glucose, capillary     Status: Abnormal   Collection Time: 10/18/18  3:57 AM  Result Value Ref Range   Glucose-Capillary 234 (H) 70 - 99 mg/dL   Comment 1 Arterial Specimen   Glucose, capillary     Status: Abnormal   Collection Time: 10/18/18  6:48 AM  Result Value Ref Range    Glucose-Capillary 245 (H) 70 - 99 mg/dL   Comment 1 Arterial Specimen   Glucose, capillary     Status: Abnormal   Collection Time: 10/18/18  8:10 AM  Result Value Ref Range   Glucose-Capillary 235 (H) 70 - 99 mg/dL   Comment 1 Capillary Specimen    Comment 2 Notify RN   Glucose, capillary     Status: Abnormal   Collection Time: 10/18/18  8:59 AM  Result Value Ref Range   Glucose-Capillary 210 (H) 70 - 99 mg/dL  Glucose, capillary     Status: Abnormal   Collection Time: 10/18/18 10:06 AM  Result Value Ref Range   Glucose-Capillary 233 (H) 70 - 99 mg/dL  Glucose, capillary     Status: Abnormal   Collection Time: 10/18/18 11:12 AM  Result Value Ref Range   Glucose-Capillary 220 (H) 70 - 99 mg/dL  Glucose, capillary     Status: Abnormal   Collection Time: 10/18/18 12:16 PM  Result Value Ref Range   Glucose-Capillary 197 (H) 70 - 99 mg/dL  Glucose, capillary     Status: Abnormal   Collection Time: 10/18/18  1:20 PM  Result Value Ref Range   Glucose-Capillary 155 (H) 70 - 99 mg/dL  Glucose, capillary       Status: Abnormal   Collection Time: 10/18/18  1:38 PM  Result Value Ref Range   Glucose-Capillary 157 (H) 70 - 99 mg/dL  Glucose, capillary     Status: Abnormal   Collection Time: 10/18/18  2:25 PM  Result Value Ref Range   Glucose-Capillary 145 (H) 70 - 99 mg/dL  Glucose, capillary     Status: Abnormal   Collection Time: 10/18/18  3:28 PM  Result Value Ref Range   Glucose-Capillary 153 (H) 70 - 99 mg/dL  Glucose, capillary     Status: Abnormal   Collection Time: 10/18/18  4:39 PM  Result Value Ref Range   Glucose-Capillary 147 (H) 70 - 99 mg/dL  Glucose, capillary     Status: Abnormal   Collection Time: 10/18/18  6:04 PM  Result Value Ref Range   Glucose-Capillary 164 (H) 70 - 99 mg/dL  Glucose, capillary     Status: Abnormal   Collection Time: 10/18/18  7:09 PM  Result Value Ref Range   Glucose-Capillary 150 (H) 70 - 99 mg/dL  Glucose, capillary     Status: Abnormal    Collection Time: 10/18/18  8:18 PM  Result Value Ref Range   Glucose-Capillary 122 (H) 70 - 99 mg/dL   Comment 1 Arterial Specimen   Renal function panel (daily at 1600)     Status: Abnormal   Collection Time: 10/18/18  9:06 PM  Result Value Ref Range   Sodium 137 135 - 145 mmol/L   Potassium 4.1 3.5 - 5.1 mmol/L   Chloride 100 98 - 111 mmol/L   CO2 23 22 - 32 mmol/L   Glucose, Bld 143 (H) 70 - 99 mg/dL   BUN 98 (H) 6 - 20 mg/dL   Creatinine, Ser 5.21 (H) 0.44 - 1.00 mg/dL   Calcium 10.8 (H) 8.9 - 10.3 mg/dL   Phosphorus 3.4 2.5 - 4.6 mg/dL   Albumin 2.0 (L) 3.5 - 5.0 g/dL   GFR calc non Af Amer 8 (L) >60 mL/min   GFR calc Af Amer 10 (L) >60 mL/min    Comment: (NOTE) The eGFR has been calculated using the CKD EPI equation. This calculation has not been validated in all clinical situations. eGFR's persistently <60 mL/min signify possible Chronic Kidney Disease.    Anion gap 14 5 - 15    Comment: Performed at [facility] [address] [place], [place] [address]  Glucose, capillary     Status: Abnormal   Collection Time: 10/18/18 11:47 PM  Result Value Ref Range   Glucose-Capillary 186 (H) 70 - 99 mg/dL   Comment 1 Arterial Specimen   Renal function panel (daily at 0500)     Status: Abnormal   Collection Time: 10/19/18  2:22 AM  Result Value Ref Range   Sodium 135 135 - 145 mmol/L   Potassium 4.0 3.5 - 5.1 mmol/L   Chloride 98 98 - 111 mmol/L   CO2 22 22 - 32 mmol/L   Glucose, Bld 214 (H) 70 - 99 mg/dL   BUN 109 (H) 6 - 20 mg/dL   Creatinine, Ser 5.59 (H) 0.44 - 1.00 mg/dL   Calcium 10.6 (H) 8.9 - 10.3 mg/dL   Phosphorus 3.5 2.5 - 4.6 mg/dL   Albumin 2.0 (L) 3.5 - 5.0 g/dL   GFR calc non Af Amer 8 (L) >60 mL/min   GFR calc Af Amer 9 (L) >60 mL/min    Comment: (NOTE) The eGFR has been calculated using the CKD EPI equation. This calculation  has not been validated in all clinical situations. eGFR's persistently <60 mL/min signify possible Chronic  Kidney Disease.    Anion gap 15 5 - 15    Comment: Performed at [facility] [address], [place], [place] [address]  APTT     Status: None   Collection Time: 10/19/18  2:22 AM  Result Value Ref Range   aPTT 31 24 - 36 seconds    Comment: Performed at [facility] [address] [place], [place] [address]  CBC     Status: Abnormal   Collection Time: 10/19/18  2:22 AM  Result Value Ref Range   WBC 21.2 (H) 4.0 - 10.5 K/uL   RBC 3.31 (L) 3.87 - 5.11 MIL/uL   Hemoglobin 8.4 (L) 12.0 - 15.0 g/dL   HCT 29.6 (L) 36.0 - 46.0 %   MCV 89.4 80.0 - 100.0 fL   MCH 25.4 (L) 26.0 - 34.0 pg   MCHC 28.4 (L) 30.0 - 36.0 g/dL   RDW 15.3 11.5 - 15.5 %   Platelets 203 150 - 400 K/uL   nRBC 0.8 (H) 0.0 - 0.2 %    Comment: Performed at [facility], [place] [address] [place], [place] [address]  Protime-INR     Status: None   Collection Time: 10/19/18  2:22 AM  Result Value Ref Range   Prothrombin Time 15.2 11.4 - 15.2 seconds   INR 1.21     Comment: Performed at [facility] [address] [place],  [address]  Magnesium     Status: Abnormal   Collection Time: 10/19/18  2:22 AM  Result Value Ref Range   Magnesium 2.8 (H) 1.7 - 2.4 mg/dL    Comment: Performed at [facility] [address], [place], [place] [address]  Glucose, capillary     Status: Abnormal   Collection Time: 10/19/18  3:56 AM  Result Value Ref Range   Glucose-Capillary 217 (H) 70 - 99 mg/dL   Comment 1 Capillary Specimen   Glucose, capillary     Status: Abnormal   Collection Time: 10/19/18  8:04 AM  Result Value Ref Range   Glucose-Capillary 236 (H) 70 - 99 mg/dL   Comment 1 Arterial Specimen   Protime-INR     Status: Abnormal   Collection Time: 10/19/18 11:47 AM  Result Value Ref Range   Prothrombin Time 15.5 (H) 11.4 - 15.2 seconds   INR 1.24     Comment: Performed at [facility] [address], [place],  [address]  I-STAT 3, arterial blood gas (G3+)     Status:  Abnormal   Collection Time: 10/19/18  3:35 PM  Result Value Ref Range   pH, Arterial 7.307 (L) 7.350 - 7.450   pCO2 arterial 41.7 32.0 - 48.0 mmHg   pO2, Arterial 287.0 (H) 83.0 - 108.0 mmHg   Bicarbonate 20.9 20.0 - 28.0 mmol/L   TCO2 22 22 - 32 mmol/L   O2 Saturation 100.0 %   Acid-base deficit 5.0 (H) 0.0 - 2.0 mmol/L   Patient temperature HIDE    Sample type ARTERIAL   CBC     Status: Abnormal   Collection Time: 10/19/18  3:55 PM  Result Value Ref Range   WBC 23.7 (H) 4.0 - 10.5 K/uL   RBC 3.47 (L) 3.87 - 5.11 MIL/uL   Hemoglobin 8.7 (L) 12.0 - 15.0 g/dL   HCT 32.0 (L) 36.0 - 46.0 %  MCV 92.2 80.0 - 100.0 fL   MCH 25.1 (L) 26.0 - 34.0 pg   MCHC 27.2 (L) 30.0 - 36.0 g/dL   RDW 15.6 (H) 11.5 - 15.5 %   Platelets 228 150 - 400 K/uL   nRBC 2.5 (H) 0.0 - 0.2 %    Comment: Performed at [facility] [address], [place], [place] [address]    ECG   N/A  Telemetry   Sinus rhythm - period of polymorphic VT noted - Personally Reviewed  Radiology    Dg Chest Port 1 View  Result Date: 10/19/2018 CLINICAL DATA:  Cardiac arrest. EXAM: PORTABLE CHEST 1 VIEW COMPARISON:  Chest x-ray from same day at 14:56. FINDINGS: Unchanged tracheostomy tube, enteric tube, and right internal jugular catheter. Stable cardiomegaly and central pulmonary vascular congestion. New hazy opacities throughout both lungs. No pleural effusion or pneumothorax. No acute osseous abnormality. IMPRESSION: 1. New hazy opacities throughout both lungs, suspicious for pulmonary edema. Electronically Signed   By: Titus Dubin M.D.   On: 10/19/2018 16:20   Dg Chest Port 1 View  Result Date: 10/19/2018 CLINICAL DATA:  Post tracheostomy. EXAM: PORTABLE CHEST 1 VIEW COMPARISON:  None. FINDINGS: New tracheostomy tube projects over the upper trachea. No evidence of pneumomediastinum. No pneumothorax. Right IJ catheter tip terminates in the distal SVC. The left IJ approach central line has been removed. Gastric  tube extends below the left hemidiaphragm. Lung volumes remain low. There is stable cardiomegaly with central vascular congestion and left basilar atelectasis. IMPRESSION: Low lung volumes with stable cardiomegaly and central vascular congestion. Support lines and tubes as above in satisfactory position. No pneumothorax or pneumomediastinum. Electronically Signed   By: Ashley Royalty M.D.   On: 10/19/2018 15:13   Dg Chest Port 1 View  Result Date: 10/18/2018 CLINICAL DATA:  Respiratory distress EXAM: PORTABLE CHEST 1 VIEW COMPARISON:  10/17/2018 FINDINGS: Endotracheal tube, nasogastric catheter and right and left jugular lines are again identified and stable. Increased vascular congestion is noted when compare with the prior exam. Mild left basilar atelectasis is noted increased from the prior study. No pneumothorax is seen. No bony abnormality is noted. IMPRESSION: Tubes and lines stable from the prior exam. No pneumothorax is noted. Increasing left basilar atelectasis. Increased vascular congestion is noted. Electronically Signed   By: Inez Catalina M.D.   On: 10/18/2018 08:42    Cardiac Studies   N/A  Assessment   Active Problems:   Respiratory failure (HCC)   Cardiac arrest (HCC)   AKI (acute kidney injury) (HCC)   CKD (chronic kidney disease) stage 5, GFR less than 15 ml/min (HCC)   Ventilator dependent (HCC)   Endotracheal tube present   Mucus plugging of bronchi   Pressure injury of skin   Tracheostomy status ([place])   Plan   Witnessed brady-asystolic arrest. Polymorphic VT - now back in sinus. High concern for CAD, although troponins have been negative. D/w Dr. Curt Bears with cardiac EP and with Dr. Ellyn Hack (interventional) - will plan urgent left heart catheterization. I discussed the risks, benefits and alternatives with the patient's 2 daughters who were present in the ICU and they agreed to proceed with the case, since Mrs. Budai cannot provide consent.  Time Spent Directly with  Patient:  I have spent a total of 35 minutes with the patient reviewing hospital notes, telemetry, EKGs, labs and examining the patient as well as establishing an assessment and plan that was discussed personally with the patient.  > 50%  of time was spent in direct patient care.  Length of Stay:  LOS: 12 days   Pixie Casino, MD, [facility], [place] Director of the [facility] Lipid Disorders &  Cardiovascular Risk Reduction Clinic Diplomate of the American Board of Clinical Lipidology Attending Cardiologist  Direct Dial: [phone]  Fax: [phone]  Website:  [email or web address] Lanzo 10/19/2018, 4:32 PM

## 2018-10-19 NOTE — Progress Notes (Signed)
Technical Description:  - CPR performance duration:  6 mins  - Was defibrillation or cardioversion used? yes  - Was external pacer placed? No  - Was patient intubated pre/post CPR? Trach pre-CPR    Post CPR evaluation:  - Final Status - Was patient successfully resuscitated ? Yes - What is current rhythm?  sinus - What is current hemodynamic status?  On levophed  Miscellaneous Information:  - Labs sent, including: CBC, CMET,  ABG, lactic   - Primary team notified?    - Family Notified? Yes  - Additional notes/ transfer status:   Initial rhythm - brady PEA  Epi x2, amio x1, shock x1  Jozy Mcphearson V. Elsworth Soho MD

## 2018-10-19 NOTE — Progress Notes (Signed)
Spoke with RN Joellen Jersey, about the DC central line order, RN states she is aware of the order and will take care of it

## 2018-10-19 NOTE — Progress Notes (Signed)
Called emergently to bedside for PEA arrest post trach. Procedure uneventful Achieved ROSC after brief CPR and epi x1. Rhythm sinus tach, wide-complex 150s While I was present at bedside, she again had another bradycardia arrest.  Achieved ROSC after 6 minutes of CPR, epi x2 and amnio x1. EKG shows sinus rhythm with left axis deviation with right bundle branch block, junctional rhythm. Started on Levophed Emergent bronchoscopy performed  which only showed small amount of blood in the proximal airways, distal airways clear, consistent with bleeding after tracheostomy, easily able to be suctioned out, no mucous plugs. Chest x-ray repeated which did not show any evidence of pneumothorax. I discussed with cardiology and EP.  Discussed with daughter who is agreeable to cardiac cath if needed to define coronary disease even at the risk of permanent renal injury.  Have discussed with renal earlier and they feel that she is going to be dialysis dependent anyways.  Additional critical care time x 20m  Ondrea Dow V. Elsworth Soho MD

## 2018-10-19 NOTE — Progress Notes (Signed)
RN at bedside, pt HR brady down to 30s, pulseless. Code blue called, cpr initiated. CCM at bedside. Epi x2 given. ROSC achieved with HR in 170s. Pt family on unit, updated by staff.

## 2018-10-19 NOTE — Interval H&P Note (Signed)
History and Physical Interval Note:  10/19/2018 4:50 PM  Catherine Williams  has presented today for surgery, with the diagnosis of recurrent PEA Arrest.    The various methods of treatment have been discussed with the patient and family. After consideration of risks, benefits and other options for treatment, the patient has consented to  Procedure(s): LEFT HEART CATH AND CORONARY ANGIOGRAPHY (N/A) with possible PERCUTANEOUS CORONARY INTERVENTION as a surgical intervention .  The patient's history has been reviewed, patient examined, no change in status, stable for surgery.  I have reviewed the patient's chart and labs.  Questions were answered to the patient's satisfaction.    Cath Lab Visit (complete for each Cath Lab visit)  Clinical Evaluation Leading to the Procedure:   ACS: No.  Non-ACS:    Anginal Classification: No Symptoms - intubated & sedated.  Polymorphic VT/PEA  Anti-ischemic medical therapy: No Therapy  Non-Invasive Test Results: No non-invasive testing performed  Prior CABG: No previous CABG  Glenetta Hew

## 2018-10-19 NOTE — Progress Notes (Signed)
NAME:  Catherine Williams, MRN:  [id number], DOB:  Oct 06, 1961, LOS: 25 ADMISSION DATE:  10/07/2018, CONSULTATION DATE:  10/07/2018 REFERRING MD:  Dr. Benjamine Mola , CHIEF COMPLAINT:  Cardiac Arrest    Brief History   57 year old obese woman with OSA and CKD stage V admitted 10/20 after PEA cardiac arrest. Underwent hypothermia protocol and started on CRRT  10/26  sustained  brief PEA arrest  related to mucous plugging, underwent bronchoscopy 10/30 brief resp/PEA arrest -mucus plugging again , bronchoscopy minimal blood + mucus suctioned out  Past Medical History  Diastolic HF (EF 65-46, T0PT), COPD, PVD, Chronic Kidney Disease (Placement of Fistula on 10/17)   Significant Hospital Events   10/20 > Presents to ED s/p Cardiac Arrest  10/22 rewarmed at 1pm 10/24 - not awake enough to extubate but did follow commands  10/25 - sister from [place]. [place] at bedside. Daugther at bedside. Both very concerned she is not fully arousable. Per RN/family - can arouse to deep voice and track and nod and follow simple commands very briefly. Then falls asleep. Only 100cc./h urine so far. Per family - s/p LUE fistula recently in anticipation of future HD needs. Baseline creat 3.8s. Creat up at 4.22m%. Office renal doc -> Dr CMarval Regal    10/26 - 2 min PEA arrest with x  1 epi last night. Related to mucus plugging seen in ET tube via bronchoscopy. Sp BAL in RML Started on CRRT overnight -> This aM on CRRT, 40% fio2, PRVC and fentanyl and levophed gtt -> RASS -1 and followed simple commands. Seems weak 10/27 -  2L off with CRRT.  Off levophed 10/17/2018 status post second PEA arrest ? Mucus plugging , CRRT stopped, bronch unimpressive  Consults: date of consult/date signed off & final recs:  Cardiology 10/20 reconsult on 10/17/2018 PCCM 10/20  Procedures (surgical and bedside):  ETT 10/20 >> Left IJ 10/20 >>  Right IJ hemodialysis catheter 10/12/2018>>  Significant Diagnostic Tests:   CT Head 10/20  >>neg ECHO 10/21 >> nml LVEF, gr 2 DD EEG 10/21 >> slowing. No epileptiform activity.  Venous doppler legs 10/21 > no evidence DVT HD cath 10/25 - start CRRT  Micro Data:  Blood 10/20 >>ng Sputum 10/20 >>ng Urine 10/28 >>  Serratia marcescens resp 10/25 >> ng   Antimicrobials:  Cefepime 10/28 >>10/31 ceftx 10/31 >>   SUBJECTIVE/OVERNIGHT/INTERVAL HX   Remains critically ill, on low-dose Levophed. Off CRRT &  insulin drip    Objective   Blood pressure (!) 51/42, pulse 79, temperature 98.4 F (36.9 C), resp. rate (!) 35, height _0  (1.575 m), weight 130.3 kg, SpO2 99 %. CVP:  [4 mmHg-15 mmHg] 11 mmHg  Vent Mode: CPAP;PSV FiO2 (%):  [40 %] 40 % Set Rate:  [24 bmp] 24 bmp Vt Set:  [400 mL] 400 mL PEEP:  [5 cmH20] 5 cmH20 Pressure Support:  [12 cmH20] 12 cmH20 Plateau Pressure:  [19 cmH20-20 cmH20] 20 cmH20   Intake/Output Summary (Last 24 hours) at 10/19/2018 0906 Last data filed at 10/19/2018 0600 Gross per 24 hour  Intake 1123.53 ml  Output -  Net 1123.53 ml   Filed Weights   10/17/18 0600 10/18/18 0530 10/19/18 0500  Weight: 130.3 kg 130.4 kg 130.3 kg   Obese, acutely ill, large tongue, orally intubated Decreased breath sounds bilateral S1-S2 normal, distant, no rub Soft obese abdomen Mild pallor, no icterus, no JVD 1+ edema Awake and interactive, grossly nonfocal Lines exit sites appear clean, no erythema  Assessment & Plan:   Acute Hypercarbic Respiratory Failure suspected secondary to decompensated Heart Failure   H/O COPD, OSA on CPAP at HS  Recurrent respiratory arrest secondary to blood clots /mucous plugs -Plan for tracheostomy -Proceed with spontaneous breathing trials after and hope to liberate from the vent soon -Pulmonary hygiene -Hypertonic saline nebs   V.Fib/PEA Cardiac Arrest - elevated troponin as expected. No evidence of DVT.  Decompensated Heart Failure  H/O Diastolic HF (EF 88-89, V6XI) S/p repeat arrest 10/12/18  due to mucus plug Hypotension post PEA arrest --Titrate Levophed to off, systolic blood pressure goal 100 -May need ischemia evaluation eventually   Acute on Chronic Stage IV Kidney Disease  Baseline Crt high 3s. . Recent placement of UE Fistula . -Off CRRT, creatinine and BUN climbing, will likely need dialysis again, if off pressors in 24 hours consider intermittent dialysis -Will need permacath placement once WBC count decreases and afebrile, probably next week   Fever ? HCAP , Serratia UTI -WBC count rose to 22 and now decreasing again -Changed to ceftriaxone -DC central line  Hypoglycemia -resolved H/O DM, now uncontrolled CBG (last 3)  Recent Labs    10/18/18 2347 10/19/18 0356 10/19/18 0804  GLUCAP 186* 217* 236*    -Off insulin drip, increase Lantus to 30 units daily -on 60 units at home SSI resistant scale  Acute Encephalopathy  -Alert and orientated post PEA arrest on 10/17/2018  Constipation -Tube feeds -Laxative as needed  Disposition / Summary of Today's Plan 10/19/18   She has had respiratory arrest x2 and hence we will proceed with tracheostomy, this is the safest way forward.  Daughter updated and in agreement. Anticipate she will wean quickly to trach collar afterwards. Initial cause of cardiac arrest of unclear, may need ischemia evaluation eventually. She does appear to be dialysis dependent now,        Code Status: FC Family Communication: Daughters updated daily   The patient is critically ill with multiple organ systems failure and requires high complexity decision making for assessment and support, frequent evaluation and titration of therapies, application of advanced monitoring technologies and extensive interpretation of multiple databases. Critical Care Time devoted to patient care services described in this note independent of APP/resident  time is 33 minutes.   Kara Mead MD. [place]. [facility] Pager [phone] If no response call [phone]   10/19/2018     10/19/2018, 9:06 AM

## 2018-10-19 NOTE — Progress Notes (Signed)
DAILY PROGRESS NOTE   Patient Name: Catherine Williams Date of Encounter: 10/19/2018  Chief Complaint   Brady-asystolic arrest  Patient Profile   57 y.o. female with sudden arrest with long ROSC. No signs of acute MI with no ECG changes and negativeTroponin Preliminary TTE with normal EF severe LVH. She has had multiple, recurrent episodes of PEA arrest, including this morning - given EPI and noted to have NSVT/SVT afterwards on telemetry, but now in normal sinus rhythm.  Subjective   Called to the bedside for patient with recurrent arrest. This incident involved bradycardia and the patient was noted to have polymorphic VT. Reviewed with Dr. Curt Bears of EP - we are recommending an ischemia evaluation for possible coronary disease.  Objective   Vitals:   10/19/18 1430 10/19/18 1520 10/19/18 1548 10/19/18 1600  BP: (!) 130/42 (!) 154/74 (!) 111/40 110/61  Pulse: 76 (!) 147 73 76  Resp: (!) 30 (!) 30 (!) 38 (!) 35  Temp:      TempSrc:      SpO2: 100% 100% 100% 100%  Weight:      Height:        Intake/Output Summary (Last 24 hours) at 10/19/2018 1632 Last data filed at 10/19/2018 1600 Gross per 24 hour  Intake 1156.7 ml  Output 100 ml  Net 1056.7 ml   Filed Weights   10/18/18 0530 10/19/18 0500 10/19/18 1027  Weight: 130.4 kg 130.3 kg 130.3 kg    Physical Exam   General appearance: intubated, sedated, trached on vent Neck: no carotid bruit, no JVD and thyroid not enlarged, symmetric, no tenderness/mass/nodules Lungs: clear to auscultation bilaterally Heart: regular rate and rhythm Abdomen: soft, non-tender; bowel sounds normal; no masses,  no organomegaly Extremities: extremities normal, atraumatic, no cyanosis or edema Pulses: 2+ and symmetric Skin: Skin color, texture, turgor normal. No rashes or lesions Neurologic: Mental status: intubated, sedated, trached Psych: Cannot assess  Inpatient Medications    Scheduled Meds: . aspirin  81 mg Oral Daily  .  chlorhexidine gluconate (MEDLINE KIT)  15 mL Mouth Rinse BID  . Chlorhexidine Gluconate Cloth  6 each Topical Daily  . heparin injection (subcutaneous)  5,000 Units Subcutaneous Q8H  . insulin aspart  0-20 Units Subcutaneous Q4H  . insulin aspart  6 Units Subcutaneous Q6H  . insulin glargine  30 Units Subcutaneous Daily  . ipratropium-albuterol  3 mL Nebulization Q6H  . mouth rinse  15 mL Mouth Rinse 10 times per day  . multivitamin  15 mL Per Tube Daily  . pantoprazole sodium  40 mg Per Tube Daily  . rosuvastatin  10 mg Per Tube QPM  . sodium chloride flush  10-40 mL Intracatheter Q12H    Continuous Infusions: . sodium chloride Stopped (10/19/18 1445)  . sodium chloride    . sodium chloride    . cefTRIAXone (ROCEPHIN)  IV Stopped (10/18/18 2055)  . norepinephrine (LEVOPHED) Adult infusion 20 mcg/min (10/19/18 1600)    PRN Meds: sodium chloride, sodium chloride, acetaminophen (TYLENOL) oral liquid 160 mg/5 mL, alteplase, bisacodyl, fentaNYL (SUBLIMAZE) injection, heparin, lidocaine (PF), lidocaine-prilocaine, pentafluoroprop-tetrafluoroeth, sodium chloride flush   Labs   Results for orders placed or performed during the hospital encounter of 10/07/18 (from the past 48 hour(s))  Renal function panel (daily at 1600)     Status: Abnormal   Collection Time: 10/17/18  4:47 PM  Result Value Ref Range   Sodium 136 135 - 145 mmol/L   Potassium 4.1 3.5 - 5.1 mmol/L  Chloride 98 98 - 111 mmol/L   CO2 23 22 - 32 mmol/L   Glucose, Bld 303 (H) 70 - 99 mg/dL   BUN 65 (H) 6 - 20 mg/dL   Creatinine, Ser 3.42 (H) 0.44 - 1.00 mg/dL   Calcium 10.9 (H) 8.9 - 10.3 mg/dL   Phosphorus 3.1 2.5 - 4.6 mg/dL   Albumin 2.1 (L) 3.5 - 5.0 g/dL   GFR calc non Af Amer 14 (L) >60 mL/min   GFR calc Af Amer 16 (L) >60 mL/min    Comment: (NOTE) The eGFR has been calculated using the CKD EPI equation. This calculation has not been validated in all clinical situations. eGFR's persistently <60 mL/min signify  possible Chronic Kidney Disease.    Anion gap 15 5 - 15    Comment: Performed at [facility] [address], [place], [place] [address]  Glucose, capillary     Status: Abnormal   Collection Time: 10/17/18  8:48 PM  Result Value Ref Range   Glucose-Capillary 233 (H) 70 - 99 mg/dL   Comment 1 Arterial Specimen   Glucose, capillary     Status: Abnormal   Collection Time: 10/18/18 12:08 AM  Result Value Ref Range   Glucose-Capillary 244 (H) 70 - 99 mg/dL   Comment 1 Arterial Specimen   Renal function panel (daily at 0500)     Status: Abnormal   Collection Time: 10/18/18  2:58 AM  Result Value Ref Range   Sodium 135 135 - 145 mmol/L   Potassium 3.9 3.5 - 5.1 mmol/L   Chloride 100 98 - 111 mmol/L   CO2 24 22 - 32 mmol/L   Glucose, Bld 277 (H) 70 - 99 mg/dL   BUN 80 (H) 6 - 20 mg/dL   Creatinine, Ser 4.24 (H) 0.44 - 1.00 mg/dL   Calcium 10.4 (H) 8.9 - 10.3 mg/dL   Phosphorus 3.2 2.5 - 4.6 mg/dL   Albumin 2.1 (L) 3.5 - 5.0 g/dL   GFR calc non Af Amer 11 (L) >60 mL/min   GFR calc Af Amer 12 (L) >60 mL/min    Comment: (NOTE) The eGFR has been calculated using the CKD EPI equation. This calculation has not been validated in all clinical situations. eGFR's persistently <60 mL/min signify possible Chronic Kidney Disease.    Anion gap 11 5 - 15    Comment: Performed at [facility] [address], [place], [place] [address]  Magnesium     Status: Abnormal   Collection Time: 10/18/18  2:58 AM  Result Value Ref Range   Magnesium 2.7 (H) 1.7 - 2.4 mg/dL    Comment: Performed at [facility] [address], [place], [place] [address]  APTT     Status: None   Collection Time: 10/18/18  2:58 AM  Result Value Ref Range   aPTT 32 24 - 36 seconds    Comment: Performed at [facility] [address] [place], [place] [address]  CBC with Differential/Platelet     Status: Abnormal   Collection Time: 10/18/18  2:58 AM  Result Value Ref Range   WBC 22.2 (H) 4.0 -  10.5 K/uL   RBC 3.45 (L) 3.87 - 5.11 MIL/uL   Hemoglobin 8.8 (L) 12.0 - 15.0 g/dL   HCT 31.5 (L) 36.0 - 46.0 %   MCV 91.3 80.0 - 100.0 fL   MCH 25.5 (L) 26.0 - 34.0 pg   MCHC 27.9 (L) 30.0 - 36.0 g/dL  RDW 15.5 11.5 - 15.5 %   Platelets 196 150 - 400 K/uL   nRBC 1.1 (H) 0.0 - 0.2 %   Neutrophils Relative % 70 %   Neutro Abs 15.5 (H) 1.7 - 7.7 K/uL   Lymphocytes Relative 14 %   Lymphs Abs 3.2 0.7 - 4.0 K/uL   Monocytes Relative 9 %   Monocytes Absolute 2.0 (H) 0.1 - 1.0 K/uL   Eosinophils Relative 2 %   Eosinophils Absolute 0.3 0.0 - 0.5 K/uL   Basophils Relative 0 %   Basophils Absolute 0.1 0.0 - 0.1 K/uL   Immature Granulocytes 5 %   Abs Immature Granulocytes 1.15 (H) 0.00 - 0.07 K/uL    Comment: Performed at [facility] [address] [place], [place] [address]  Phosphorus     Status: None   Collection Time: 10/18/18  2:58 AM  Result Value Ref Range   Phosphorus 3.2 2.5 - 4.6 mg/dL    Comment: Performed at [facility], [place] [address], [place], [place] [address]  Blood gas, arterial     Status: Abnormal   Collection Time: 10/18/18  3:35 AM  Result Value Ref Range   FIO2 40.00    Delivery systems VENTILATOR    Mode PRESSURE REGULATED VOLUME CONTROL    VT 380 mL   LHR 20 resp/min   Peep/cpap 5.0 cm H20   pH, Arterial 7.286 (L) 7.350 - 7.450   pCO2 arterial 52.9 (H) 32.0 - 48.0 mmHg   pO2, Arterial 161 (H) 83.0 - 108.0 mmHg   Bicarbonate 24.4 20.0 - 28.0 mmol/L   Acid-base deficit 1.4 0.0 - 2.0 mmol/L   O2 Saturation 99.0 %   Patient temperature 98.6    Collection site A-LINE    Drawn by [phone]    Sample type ARTERIAL DRAW    Allens test (pass/fail) PASS PASS  Glucose, capillary     Status: Abnormal   Collection Time: 10/18/18  3:57 AM  Result Value Ref Range   Glucose-Capillary 234 (H) 70 - 99 mg/dL   Comment 1 Arterial Specimen   Glucose, capillary     Status: Abnormal   Collection Time: 10/18/18  6:48 AM  Result Value Ref Range    Glucose-Capillary 245 (H) 70 - 99 mg/dL   Comment 1 Arterial Specimen   Glucose, capillary     Status: Abnormal   Collection Time: 10/18/18  8:10 AM  Result Value Ref Range   Glucose-Capillary 235 (H) 70 - 99 mg/dL   Comment 1 Capillary Specimen    Comment 2 Notify RN   Glucose, capillary     Status: Abnormal   Collection Time: 10/18/18  8:59 AM  Result Value Ref Range   Glucose-Capillary 210 (H) 70 - 99 mg/dL  Glucose, capillary     Status: Abnormal   Collection Time: 10/18/18 10:06 AM  Result Value Ref Range   Glucose-Capillary 233 (H) 70 - 99 mg/dL  Glucose, capillary     Status: Abnormal   Collection Time: 10/18/18 11:12 AM  Result Value Ref Range   Glucose-Capillary 220 (H) 70 - 99 mg/dL  Glucose, capillary     Status: Abnormal   Collection Time: 10/18/18 12:16 PM  Result Value Ref Range   Glucose-Capillary 197 (H) 70 - 99 mg/dL  Glucose, capillary     Status: Abnormal   Collection Time: 10/18/18  1:20 PM  Result Value Ref Range   Glucose-Capillary 155 (H) 70 - 99 mg/dL  Glucose, capillary  Status: Abnormal   Collection Time: 10/18/18  1:38 PM  Result Value Ref Range   Glucose-Capillary 157 (H) 70 - 99 mg/dL  Glucose, capillary     Status: Abnormal   Collection Time: 10/18/18  2:25 PM  Result Value Ref Range   Glucose-Capillary 145 (H) 70 - 99 mg/dL  Glucose, capillary     Status: Abnormal   Collection Time: 10/18/18  3:28 PM  Result Value Ref Range   Glucose-Capillary 153 (H) 70 - 99 mg/dL  Glucose, capillary     Status: Abnormal   Collection Time: 10/18/18  4:39 PM  Result Value Ref Range   Glucose-Capillary 147 (H) 70 - 99 mg/dL  Glucose, capillary     Status: Abnormal   Collection Time: 10/18/18  6:04 PM  Result Value Ref Range   Glucose-Capillary 164 (H) 70 - 99 mg/dL  Glucose, capillary     Status: Abnormal   Collection Time: 10/18/18  7:09 PM  Result Value Ref Range   Glucose-Capillary 150 (H) 70 - 99 mg/dL  Glucose, capillary     Status: Abnormal    Collection Time: 10/18/18  8:18 PM  Result Value Ref Range   Glucose-Capillary 122 (H) 70 - 99 mg/dL   Comment 1 Arterial Specimen   Renal function panel (daily at 1600)     Status: Abnormal   Collection Time: 10/18/18  9:06 PM  Result Value Ref Range   Sodium 137 135 - 145 mmol/L   Potassium 4.1 3.5 - 5.1 mmol/L   Chloride 100 98 - 111 mmol/L   CO2 23 22 - 32 mmol/L   Glucose, Bld 143 (H) 70 - 99 mg/dL   BUN 98 (H) 6 - 20 mg/dL   Creatinine, Ser 5.21 (H) 0.44 - 1.00 mg/dL   Calcium 10.8 (H) 8.9 - 10.3 mg/dL   Phosphorus 3.4 2.5 - 4.6 mg/dL   Albumin 2.0 (L) 3.5 - 5.0 g/dL   GFR calc non Af Amer 8 (L) >60 mL/min   GFR calc Af Amer 10 (L) >60 mL/min    Comment: (NOTE) The eGFR has been calculated using the CKD EPI equation. This calculation has not been validated in all clinical situations. eGFR's persistently <60 mL/min signify possible Chronic Kidney Disease.    Anion gap 14 5 - 15    Comment: Performed at [facility] [address] [place], [place] [address]  Glucose, capillary     Status: Abnormal   Collection Time: 10/18/18 11:47 PM  Result Value Ref Range   Glucose-Capillary 186 (H) 70 - 99 mg/dL   Comment 1 Arterial Specimen   Renal function panel (daily at 0500)     Status: Abnormal   Collection Time: 10/19/18  2:22 AM  Result Value Ref Range   Sodium 135 135 - 145 mmol/L   Potassium 4.0 3.5 - 5.1 mmol/L   Chloride 98 98 - 111 mmol/L   CO2 22 22 - 32 mmol/L   Glucose, Bld 214 (H) 70 - 99 mg/dL   BUN 109 (H) 6 - 20 mg/dL   Creatinine, Ser 5.59 (H) 0.44 - 1.00 mg/dL   Calcium 10.6 (H) 8.9 - 10.3 mg/dL   Phosphorus 3.5 2.5 - 4.6 mg/dL   Albumin 2.0 (L) 3.5 - 5.0 g/dL   GFR calc non Af Amer 8 (L) >60 mL/min   GFR calc Af Amer 9 (L) >60 mL/min    Comment: (NOTE) The eGFR has been calculated using the CKD EPI equation. This calculation  has not been validated in all clinical situations. eGFR's persistently <60 mL/min signify possible Chronic  Kidney Disease.    Anion gap 15 5 - 15    Comment: Performed at [facility] [address], [place], [place] [address]  APTT     Status: None   Collection Time: 10/19/18  2:22 AM  Result Value Ref Range   aPTT 31 24 - 36 seconds    Comment: Performed at [facility] [address] [place], [place] [address]  CBC     Status: Abnormal   Collection Time: 10/19/18  2:22 AM  Result Value Ref Range   WBC 21.2 (H) 4.0 - 10.5 K/uL   RBC 3.31 (L) 3.87 - 5.11 MIL/uL   Hemoglobin 8.4 (L) 12.0 - 15.0 g/dL   HCT 29.6 (L) 36.0 - 46.0 %   MCV 89.4 80.0 - 100.0 fL   MCH 25.4 (L) 26.0 - 34.0 pg   MCHC 28.4 (L) 30.0 - 36.0 g/dL   RDW 15.3 11.5 - 15.5 %   Platelets 203 150 - 400 K/uL   nRBC 0.8 (H) 0.0 - 0.2 %    Comment: Performed at [facility], [place] [address] [place], [place] [address]  Protime-INR     Status: None   Collection Time: 10/19/18  2:22 AM  Result Value Ref Range   Prothrombin Time 15.2 11.4 - 15.2 seconds   INR 1.21     Comment: Performed at [facility] [address] [place], [place] [address]  Magnesium     Status: Abnormal   Collection Time: 10/19/18  2:22 AM  Result Value Ref Range   Magnesium 2.8 (H) 1.7 - 2.4 mg/dL    Comment: Performed at [facility] [address], [place], [place] [address]  Glucose, capillary     Status: Abnormal   Collection Time: 10/19/18  3:56 AM  Result Value Ref Range   Glucose-Capillary 217 (H) 70 - 99 mg/dL   Comment 1 Capillary Specimen   Glucose, capillary     Status: Abnormal   Collection Time: 10/19/18  8:04 AM  Result Value Ref Range   Glucose-Capillary 236 (H) 70 - 99 mg/dL   Comment 1 Arterial Specimen   Protime-INR     Status: Abnormal   Collection Time: 10/19/18 11:47 AM  Result Value Ref Range   Prothrombin Time 15.5 (H) 11.4 - 15.2 seconds   INR 1.24     Comment: Performed at [facility] [address], [place],  [address]  I-STAT 3, arterial blood gas (G3+)     Status:  Abnormal   Collection Time: 10/19/18  3:35 PM  Result Value Ref Range   pH, Arterial 7.307 (L) 7.350 - 7.450   pCO2 arterial 41.7 32.0 - 48.0 mmHg   pO2, Arterial 287.0 (H) 83.0 - 108.0 mmHg   Bicarbonate 20.9 20.0 - 28.0 mmol/L   TCO2 22 22 - 32 mmol/L   O2 Saturation 100.0 %   Acid-base deficit 5.0 (H) 0.0 - 2.0 mmol/L   Patient temperature HIDE    Sample type ARTERIAL   CBC     Status: Abnormal   Collection Time: 10/19/18  3:55 PM  Result Value Ref Range   WBC 23.7 (H) 4.0 - 10.5 K/uL   RBC 3.47 (L) 3.87 - 5.11 MIL/uL   Hemoglobin 8.7 (L) 12.0 - 15.0 g/dL   HCT 32.0 (L) 36.0 - 46.0 %  MCV 92.2 80.0 - 100.0 fL   MCH 25.1 (L) 26.0 - 34.0 pg   MCHC 27.2 (L) 30.0 - 36.0 g/dL   RDW 15.6 (H) 11.5 - 15.5 %   Platelets 228 150 - 400 K/uL   nRBC 2.5 (H) 0.0 - 0.2 %    Comment: Performed at [facility] [address], [place], [place] [address]    ECG   N/A  Telemetry   Sinus rhythm - period of polymorphic VT noted - Personally Reviewed  Radiology    Dg Chest Port 1 View  Result Date: 10/19/2018 CLINICAL DATA:  Cardiac arrest. EXAM: PORTABLE CHEST 1 VIEW COMPARISON:  Chest x-ray from same day at 14:56. FINDINGS: Unchanged tracheostomy tube, enteric tube, and right internal jugular catheter. Stable cardiomegaly and central pulmonary vascular congestion. New hazy opacities throughout both lungs. No pleural effusion or pneumothorax. No acute osseous abnormality. IMPRESSION: 1. New hazy opacities throughout both lungs, suspicious for pulmonary edema. Electronically Signed   By: Titus Dubin M.D.   On: 10/19/2018 16:20   Dg Chest Port 1 View  Result Date: 10/19/2018 CLINICAL DATA:  Post tracheostomy. EXAM: PORTABLE CHEST 1 VIEW COMPARISON:  None. FINDINGS: New tracheostomy tube projects over the upper trachea. No evidence of pneumomediastinum. No pneumothorax. Right IJ catheter tip terminates in the distal SVC. The left IJ approach central line has been removed. Gastric  tube extends below the left hemidiaphragm. Lung volumes remain low. There is stable cardiomegaly with central vascular congestion and left basilar atelectasis. IMPRESSION: Low lung volumes with stable cardiomegaly and central vascular congestion. Support lines and tubes as above in satisfactory position. No pneumothorax or pneumomediastinum. Electronically Signed   By: Ashley Royalty M.D.   On: 10/19/2018 15:13   Dg Chest Port 1 View  Result Date: 10/18/2018 CLINICAL DATA:  Respiratory distress EXAM: PORTABLE CHEST 1 VIEW COMPARISON:  10/17/2018 FINDINGS: Endotracheal tube, nasogastric catheter and right and left jugular lines are again identified and stable. Increased vascular congestion is noted when compare with the prior exam. Mild left basilar atelectasis is noted increased from the prior study. No pneumothorax is seen. No bony abnormality is noted. IMPRESSION: Tubes and lines stable from the prior exam. No pneumothorax is noted. Increasing left basilar atelectasis. Increased vascular congestion is noted. Electronically Signed   By: Inez Catalina M.D.   On: 10/18/2018 08:42    Cardiac Studies   N/A  Assessment   Active Problems:   Respiratory failure (HCC)   Cardiac arrest (HCC)   AKI (acute kidney injury) (HCC)   CKD (chronic kidney disease) stage 5, GFR less than 15 ml/min (HCC)   Ventilator dependent (HCC)   Endotracheal tube present   Mucus plugging of bronchi   Pressure injury of skin   Tracheostomy status ([place])   Plan   Witnessed brady-asystolic arrest. Polymorphic VT - now back in sinus. High concern for CAD, although troponins have been negative. D/w Dr. Curt Bears with cardiac EP and with Dr. Ellyn Hack (interventional) - will plan urgent left heart catheterization. I discussed the risks, benefits and alternatives with the patient's 2 daughters who were present in the ICU and they agreed to proceed with the case, since Mrs. Pett cannot provide consent.  Time Spent Directly with  Patient:  I have spent a total of 35 minutes with the patient reviewing hospital notes, telemetry, EKGs, labs and examining the patient as well as establishing an assessment and plan that was discussed personally with the patient.  > 50%  of time was spent in direct patient care.  Length of Stay:  LOS: 12 days   Pixie Casino, MD, [facility], [place] Director of the Advanced Lipid Disorders &  Cardiovascular Risk Reduction Clinic Diplomate of the American Board of Clinical Lipidology Attending Cardiologist  Direct Dial: [phone]  Fax: [phone]  Website:  [email or web address] [email or web address] Greta Yung 10/19/2018, 4:32 PM

## 2018-10-19 NOTE — Progress Notes (Signed)
Chaplain responded to Code Blue. Chaplain provided ministry of presence to daughter in waiting area.  Chaplain will refer patient to incoming on-call night chaplain. Tamsen Snider Pager [phone]

## 2018-10-19 NOTE — Progress Notes (Signed)
RT responded to code blue alert.  Pt taken off vent and manually ventilated until pulses returned.  Pt placed back on full vent support on 100% FIO2.

## 2018-10-19 NOTE — Code Documentation (Signed)
  Patient Name: Catherine Williams   MRN: [id number]   Date of Birth/ Sex: 28-Sep-1961 , female      Admission Date: 10/07/2018  Attending Provider: Rigoberto Noel, MD  Primary Diagnosis: <principal problem not specified>   Indication: Pt was in her usual state of health until this PM, when she was noted to be in PEA arrest. Code blue was subsequently called. At the time of arrival on scene, ACLS performed and ROSC underway.    Technical Description:  - CPR performance duration:  6 minutes  - Was defibrillation or cardioversion used? No   - Was external pacer placed? No  - Was patient intubated pre/post CPR? Yes, trach prior to CPR   Medications Administered: Y = Yes; Blank = No Amiodarone    Atropine    Calcium    Epinephrine  Y  Lidocaine    Magnesium    Norepinephrine    Phenylephrine    Sodium bicarbonate    Vasopressin     Post CPR evaluation:  - Final Status - Was patient successfully resuscitated ? Yes - What is current rhythm? SVT - What is current hemodynamic status? Unstable, placed on levophed  Miscellaneous Information:  - Labs sent, including: Renal function panel, CBC, LA, ABG  - Primary team notified?  Yes  - Family Notified? Yes  - Additional notes/ transfer status: Per primary team     Molli Hazard A, DO  10/19/2018, 3:32 PM

## 2018-10-19 NOTE — Progress Notes (Signed)
Pt in vtach, compressions started. CCM at bedside. Pt shocked x2,  Epi x2 and amio 300mg  x1 given. ROSC achieved with HR in 70s. Family on unit, updated by MD. Pt to go to cath lab.

## 2018-10-19 NOTE — Progress Notes (Signed)
Pt transported to Cath lab and returned to 1T86 without complications.

## 2018-10-19 NOTE — Progress Notes (Signed)
Pt off unit at this time to cath lab

## 2018-10-19 NOTE — Procedures (Signed)
Bronchoscopy Procedure Note AILYNN GOW [id number] Oct 20, 1961  Procedure: Bronchoscopy Indications: Diagnostic evaluation of the airways  Procedure Details Consent: Unable to obtain consent because of emergent medical necessity. Time Out: Verified patient identification, verified procedure, site/side was marked, verified correct patient position, special equipment/implants available, medications/allergies/relevent history reviewed, required imaging and test results available.  Performed  In preparation for procedure, patient was given 100% FiO2 and bronchoscope lubricated. Sedation: none  Airway entered and the following bronchi were examined: Bronchi.   Procedures performed: inspection after cardiac arrest to ensure no mucus plugs. Blood seen in proximal airways due to trach, distal airways noted to be clean after flushing with saline Bronchoscope removed.  , Patient placed back on 100% FiO2 at conclusion of procedure.    Evaluation Hemodynamic Status: Persistent hypotension treated with pressors; O2 sats: stable throughout Patient's Current Condition: unstable Specimens:  None Complications: No apparent complications Patient did tolerate procedure well.   Leanna Sato Elsworth Soho 10/19/2018

## 2018-10-19 NOTE — Progress Notes (Signed)
Patient more alert and attempting to communicate. Patient was able to communicate that her back was itching. She also mouthed the words thank you after her face was washed.

## 2018-10-19 NOTE — Progress Notes (Signed)
[facility] Progress Note    Assessment/ Plan:   57 year old black female with numerous medical problems including advanced CKD-status post AV fistula. She is now status post cardiac arrest and has had acute on chronic renal failure  1.Renal-acute on advanced chronic renal failure after cardiac arrest.Initially, she was making good urine and kidney function was stable. Now, after blood pressure is controlled possibly has had some relative hypotension giving some ATN with worsening numbers. CRRT initiated10/25-10/30 (AM) ; working possibly toward extubation but has had 2nd PEA arrest. - No e/o renal recovery and she will be dialysis dependent now.  - Very low dose Levophed with MAP 60-65; will give trial of iHD today and hopefully she tolerates.   - Will  Cancel VIR conversion to TC with the leukocytosis.  2. Hypertension/volume-is volume overloaded but now blood pressure is better. Shewas only on a very low dose of Coregnow stopped.  3.Hyperkalemia- improved  4. Anemia--supportive care for now, dropping- latest iron stores good- startedESA  5.Status post arrest-I guess thinking due to OSA-not sure if more significant cardiac work-up is required- now has happened again  Subjective:   No events overnight.  Denies CP/n/v; able to communicate but limited bec mouthing words with tube in mouth and unable to write clearly.   Objective:   BP (!) 51/42   Pulse 79   Temp 98.4 F (36.9 C)   Resp (!) 35   Ht 5' 2"  (1.575 m)   Wt 130.3 kg   SpO2 99%   BMI 52.54 kg/m   Intake/Output Summary (Last 24 hours) at 10/19/2018 0915 Last data filed at 10/19/2018 0600 Gross per 24 hour  Intake 1123.53 ml  Output -  Net 1123.53 ml   Weight change: -0.1 kg  Physical Exam: General: sedated, intubated- obese, pleasant Heart: RRR Lungs: dec BS at bases Abdomen: obese, soft non tender Extremities: pitting edemaimproved Dialysis Access: right IJ vascath  placed 10/25  Imaging: Dg Chest Port 1 View  Result Date: 10/18/2018 CLINICAL DATA:  Respiratory distress EXAM: PORTABLE CHEST 1 VIEW COMPARISON:  10/17/2018 FINDINGS: Endotracheal tube, nasogastric catheter and right and left jugular lines are again identified and stable. Increased vascular congestion is noted when compare with the prior exam. Mild left basilar atelectasis is noted increased from the prior study. No pneumothorax is seen. No bony abnormality is noted. IMPRESSION: Tubes and lines stable from the prior exam. No pneumothorax is noted. Increasing left basilar atelectasis. Increased vascular congestion is noted. Electronically Signed   By: Inez Catalina M.D.   On: 10/18/2018 08:42    Labs: BMET Recent Labs  Lab 10/16/18 1621 10/17/18 0414 10/17/18 0728 10/17/18 1647 10/18/18 0258 10/18/18 2106 10/19/18 0222  NA 137 138 137 136 135 137 135  K 4.4 4.4 4.2 4.1 3.9 4.1 4.0  CL 102 101 100 98 100 100 98  CO2 23 24 24 23 24 23 22   GLUCOSE 191* 232* 240*  245* 303* 277* 143* 214*  BUN 46* 44* 50* 65* 80* 98* 109*  CREATININE 2.26* 2.37* 2.69* 3.42* 4.24* 5.21* 5.59*  CALCIUM 10.4* 10.7* 10.6* 10.9* 10.4* 10.8* 10.6*  PHOS 2.8 3.5 3.8 3.1 3.2  3.2 3.4 3.5   CBC Recent Labs  Lab 10/15/18 0405 10/16/18 0519 10/17/18 0414 10/18/18 0258 10/19/18 0222  WBC 13.1* 12.7* 19.5* 22.2* 21.2*  NEUTROABS 9.9* 10.3* 14.3* 15.5*  --   HGB 8.5* 9.0* 9.4* 8.8* 8.4*  HCT 31.8* 33.7* 35.2* 31.5* 29.6*  MCV 90.9 92.6 92.4  91.3 89.4  PLT 196 203 214 196 203    Medications:    . aspirin  81 mg Oral Daily  . chlorhexidine gluconate (MEDLINE KIT)  15 mL Mouth Rinse BID  . Chlorhexidine Gluconate Cloth  6 each Topical Daily  . feeding supplement (PRO-STAT SUGAR FREE 64)  30 mL Per Tube TID  . feeding supplement (VITAL HIGH PROTEIN)  1,000 mL Per Tube Q24H  . heparin injection (subcutaneous)  5,000 Units Subcutaneous Q8H  . insulin aspart  0-20 Units Subcutaneous Q4H  . insulin  aspart  6 Units Subcutaneous Q6H  . insulin glargine  30 Units Subcutaneous Daily  . ipratropium-albuterol  3 mL Nebulization Q6H  . mouth rinse  15 mL Mouth Rinse 10 times per day  . multivitamin  15 mL Per Tube Daily  . pantoprazole sodium  40 mg Per Tube Daily  . rosuvastatin  10 mg Per Tube QPM  . sodium chloride flush  10-40 mL Intracatheter Q12H      Otelia Santee, MD 10/19/2018, 9:15 AM

## 2018-10-19 NOTE — Progress Notes (Signed)
Dr. Nelda Marseille at bedside, trach placed. Pt tolerated procedure well.

## 2018-10-19 NOTE — Progress Notes (Signed)
Dialysis RN at bedside for pt hemodialysis treatment

## 2018-10-19 NOTE — Progress Notes (Signed)
Pt on the hemodialysis after 15 mins. Pt BP 91/40 mmhg. Code blue active and blood returned. Dr. Augustin Coupe notified.

## 2018-10-20 DIAGNOSIS — R570 Cardiogenic shock: Secondary | ICD-10-CM

## 2018-10-20 DIAGNOSIS — J9622 Acute and chronic respiratory failure with hypercapnia: Secondary | ICD-10-CM

## 2018-10-20 LAB — RENAL FUNCTION PANEL
ALBUMIN: 1.9 g/dL — AB (ref 3.5–5.0)
ANION GAP: 14 (ref 5–15)
Albumin: 2 g/dL — ABNORMAL LOW (ref 3.5–5.0)
Anion gap: 12 (ref 5–15)
BUN: 119 mg/dL — ABNORMAL HIGH (ref 6–20)
BUN: 125 mg/dL — AB (ref 6–20)
CALCIUM: 10.5 mg/dL — AB (ref 8.9–10.3)
CO2: 22 mmol/L (ref 22–32)
CO2: 22 mmol/L (ref 22–32)
Calcium: 10.4 mg/dL — ABNORMAL HIGH (ref 8.9–10.3)
Chloride: 100 mmol/L (ref 98–111)
Chloride: 99 mmol/L (ref 98–111)
Creatinine, Ser: 6.52 mg/dL — ABNORMAL HIGH (ref 0.44–1.00)
Creatinine, Ser: 7.19 mg/dL — ABNORMAL HIGH (ref 0.44–1.00)
GFR calc Af Amer: 7 mL/min — ABNORMAL LOW (ref >60)
GFR calc Af Amer: 6 mL/min — ABNORMAL LOW (ref >60)
GFR calc non Af Amer: 6 mL/min — ABNORMAL LOW (ref >60)
GFR calc non Af Amer: 6 mL/min — ABNORMAL LOW (ref >60)
GLUCOSE: 196 mg/dL — AB (ref 70–99)
Glucose, Bld: 203 mg/dL — ABNORMAL HIGH (ref 70–99)
PHOSPHORUS: 5.1 mg/dL — AB (ref 2.5–4.6)
POTASSIUM: 4.6 mmol/L (ref 3.5–5.1)
Phosphorus: 4.8 mg/dL — ABNORMAL HIGH (ref 2.5–4.6)
Potassium: 4.6 mmol/L (ref 3.5–5.1)
SODIUM: 135 mmol/L (ref 135–145)
Sodium: 134 mmol/L — ABNORMAL LOW (ref 135–145)

## 2018-10-20 LAB — CBC
HCT: 28.9 % — ABNORMAL LOW (ref 36.0–46.0)
HEMOGLOBIN: 8.1 g/dL — AB (ref 12.0–15.0)
MCH: 24.9 pg — ABNORMAL LOW (ref 26.0–34.0)
MCHC: 28 g/dL — ABNORMAL LOW (ref 30.0–36.0)
MCV: 88.9 fL (ref 80.0–100.0)
NRBC: 0.5 % — AB (ref 0.0–0.2)
Platelets: 251 10*3/uL (ref 150–400)
RBC: 3.25 MIL/uL — AB (ref 3.87–5.11)
RDW: 15.5 % (ref 11.5–15.5)
WBC: 22.9 10*3/uL — ABNORMAL HIGH (ref 4.0–10.5)

## 2018-10-20 LAB — GLUCOSE, CAPILLARY
GLUCOSE-CAPILLARY: 150 mg/dL — AB (ref 70–99)
GLUCOSE-CAPILLARY: 184 mg/dL — AB (ref 70–99)
Glucose-Capillary: 142 mg/dL — ABNORMAL HIGH (ref 70–99)
Glucose-Capillary: 166 mg/dL — ABNORMAL HIGH (ref 70–99)
Glucose-Capillary: 168 mg/dL — ABNORMAL HIGH (ref 70–99)
Glucose-Capillary: 170 mg/dL — ABNORMAL HIGH (ref 70–99)
Glucose-Capillary: 182 mg/dL — ABNORMAL HIGH (ref 70–99)
Glucose-Capillary: 184 mg/dL — ABNORMAL HIGH (ref 70–99)

## 2018-10-20 LAB — MAGNESIUM: Magnesium: 2.9 mg/dL — ABNORMAL HIGH (ref 1.7–2.4)

## 2018-10-20 LAB — HEPATITIS B SURFACE ANTIGEN

## 2018-10-20 LAB — APTT: APTT: 33 s (ref 24–36)

## 2018-10-20 MED ORDER — PRO-STAT SUGAR FREE PO LIQD: 30.0000 mL | Freq: Two times a day (BID) | ORAL | Status: DC

## 2018-10-20 MED ORDER — VITAL HIGH PROTEIN PO LIQD
1000.0000 mL | ORAL | Status: DC
  Administered 2018-10-20 – 2018-11-01 (×10): 1000 mL

## 2018-10-20 MED ORDER — DEXMEDETOMIDINE HCL IN NACL 400 MCG/100ML IV SOLN
0.4000 ug/kg/h | INTRAVENOUS | Status: DC
  Administered 2018-10-20: 0.4 ug/kg/h via INTRAVENOUS
  Administered 2018-10-21 – 2018-10-22 (×3): 0.2 ug/kg/h via INTRAVENOUS
  Administered 2018-10-22 – 2018-10-23 (×3): 0.4 ug/kg/h via INTRAVENOUS
  Administered 2018-10-23: 0.2 ug/kg/h via INTRAVENOUS
  Filled 2018-10-20 (×8): qty 100

## 2018-10-20 MED FILL — Medication: Qty: 1 | Status: AC

## 2018-10-20 NOTE — Progress Notes (Signed)
Dr. Nelda Marseille at bedside to assess pt. Chart reviewed. MD updated on pt condition. MD to restart tube feeds, d/c propofol and start precedex for sedation. Restarting CRRT being delayed at this time, will be reassessed tomorrow. MD also ordering wound consult for pt chest wound.

## 2018-10-20 NOTE — Progress Notes (Signed)
[facility] Progress Note    Assessment/ Plan:   57 year old black female with numerous medical problems including advanced CKD-status post AV fistula. She is now status post cardiac arrest and has had acute on chronic renal failure  1.Renal-acute on advanced chronic renal failure after cardiac arrest.Initially, she was making good urine and kidney function was stable. Now, after blood pressure is controlled possibly has had some relative hypotension giving some ATN with worsening numbers. CRRT initiated10/25-10/30 (AM) ; working possibly toward extubation but has had 2nd PEA arrest. - No e/o renal recovery and she will be dialysis dependent now.  -  Currently on Levophed 10  - did not tolerate iHD 11/1 -> only 15 min and went into PEA arrest x2 + VT's --> emergent cath  - Would like to hold off on restarting CRRT till Sun or Mon -> no acute indication.  -Holding off on  VIR conversion to TC with the leukocytosis but eventually will need to change location of temp cath after ~10 days.  2. Hypertension/volume-is volume overloaded but now blood pressure is better. Shewas only on a very low dose of Coregnow stopped.  3.Hyperkalemia- improved  4. Anemia--supportive care for now, dropping- latest iron stores good- startedESA  5.Status post arrest-I guess thinking due to OSA-not sure if more significant cardiac work-up is required- now has happened again - cath prox to mid RCA 55% not enough to explain PEA arrest per cards.   Subjective:   No events overnight.  Denies CP/n/v; able to communicate but limited bec mouthing words with tube in mouth and unable to write clearly.   Objective:   BP (!) 103/34   Pulse 77   Temp 99 F (37.2 C) (Oral)   Resp (!) 24   Ht 5' 2"  (1.575 m)   Wt 133 kg   SpO2 98%   BMI 53.63 kg/m   Intake/Output Summary (Last 24 hours) at 10/20/2018 0528 Last data filed at 10/20/2018 0400 Gross per 24 hour  Intake  929.82 ml  Output 100 ml  Net 829.82 ml   Weight change: 0 kg  Physical Exam: General: sedated, intubated- obese, pleasant Heart: RRR Lungs: dec BS at bases Abdomen: obese, soft non tender Extremities: pitting edemaimproved Dialysis Access: right IJ vascath placed 10/25  Imaging: Dg Chest Port 1 View  Result Date: 10/19/2018 CLINICAL DATA:  Cardiac arrest. EXAM: PORTABLE CHEST 1 VIEW COMPARISON:  Chest x-ray from same day at 14:56. FINDINGS: Unchanged tracheostomy tube, enteric tube, and right internal jugular catheter. Stable cardiomegaly and central pulmonary vascular congestion. New hazy opacities throughout both lungs. No pleural effusion or pneumothorax. No acute osseous abnormality. IMPRESSION: 1. New hazy opacities throughout both lungs, suspicious for pulmonary edema. Electronically Signed   By: Titus Dubin M.D.   On: 10/19/2018 16:20   Dg Chest Port 1 View  Result Date: 10/19/2018 CLINICAL DATA:  Post tracheostomy. EXAM: PORTABLE CHEST 1 VIEW COMPARISON:  None. FINDINGS: New tracheostomy tube projects over the upper trachea. No evidence of pneumomediastinum. No pneumothorax. Right IJ catheter tip terminates in the distal SVC. The left IJ approach central line has been removed. Gastric tube extends below the left hemidiaphragm. Lung volumes remain low. There is stable cardiomegaly with central vascular congestion and left basilar atelectasis. IMPRESSION: Low lung volumes with stable cardiomegaly and central vascular congestion. Support lines and tubes as above in satisfactory position. No pneumothorax or pneumomediastinum. Electronically Signed   By: Ashley Royalty M.D.   On: 10/19/2018 15:13  Dg Chest Port 1 View  Result Date: 10/18/2018 CLINICAL DATA:  Respiratory distress EXAM: PORTABLE CHEST 1 VIEW COMPARISON:  10/17/2018 FINDINGS: Endotracheal tube, nasogastric catheter and right and left jugular lines are again identified and stable. Increased vascular congestion is noted  when compare with the prior exam. Mild left basilar atelectasis is noted increased from the prior study. No pneumothorax is seen. No bony abnormality is noted. IMPRESSION: Tubes and lines stable from the prior exam. No pneumothorax is noted. Increasing left basilar atelectasis. Increased vascular congestion is noted. Electronically Signed   By: Inez Catalina M.D.   On: 10/18/2018 08:42    Labs: BMET Recent Labs  Lab 10/16/18 1621 10/17/18 0414 10/17/18 0728 10/17/18 1647 10/18/18 0258 10/18/18 2106 10/19/18 0222 10/19/18 1555  NA 137 138 137 136 135 137 135 134*  K 4.4 4.4 4.2 4.1 3.9 4.1 4.0 3.9  CL 102 101 100 98 100 100 98 99  CO2 23 24 24 23 24 23 22  21*  GLUCOSE 191* 232* 240*  245* 303* 277* 143* 214* 342*  BUN 46* 44* 50* 65* 80* 98* 109* 108*  CREATININE 2.26* 2.37* 2.69* 3.42* 4.24* 5.21* 5.59* 5.73*  CALCIUM 10.4* 10.7* 10.6* 10.9* 10.4* 10.8* 10.6* 10.3  PHOS 2.8 3.5 3.8 3.1 3.2  3.2 3.4 3.5  --    CBC Recent Labs  Lab 10/15/18 0405 10/16/18 0519 10/17/18 0414 10/18/18 0258 10/19/18 0222 10/19/18 1555 10/20/18 0433  WBC 13.1* 12.7* 19.5* 22.2* 21.2* 23.7* 22.9*  NEUTROABS 9.9* 10.3* 14.3* 15.5*  --   --   --   HGB 8.5* 9.0* 9.4* 8.8* 8.4* 8.7* 8.1*  HCT 31.8* 33.7* 35.2* 31.5* 29.6* 32.0* 28.9*  MCV 90.9 92.6 92.4 91.3 89.4 92.2 88.9  PLT 196 203 214 196 203 228 251    Medications:    . aspirin  81 mg Oral Daily  . chlorhexidine gluconate (MEDLINE KIT)  15 mL Mouth Rinse BID  . Chlorhexidine Gluconate Cloth  6 each Topical Daily  . heparin injection (subcutaneous)  5,000 Units Subcutaneous Q8H  . insulin aspart  0-20 Units Subcutaneous Q4H  . insulin aspart  6 Units Subcutaneous Q6H  . insulin glargine  30 Units Subcutaneous Daily  . ipratropium-albuterol  3 mL Nebulization Q6H  . mouth rinse  15 mL Mouth Rinse 10 times per day  . multivitamin  15 mL Per Tube Daily  . pantoprazole sodium  40 mg Per Tube Daily  . rosuvastatin  10 mg Per Tube QPM  .  sodium chloride flush  3 mL Intravenous Q12H      Otelia Santee, MD 10/20/2018, 5:28 AM

## 2018-10-20 NOTE — Progress Notes (Signed)
Progress Note  Patient Name: Catherine Williams Date of Encounter: 10/20/2018  Primary Cardiologist: No primary care provider on file.   Subjective   Remains intubated/sedated. Responds to some commands when sedation is weaned. No major arrhythmic events overnight. Coronary angio yesterday with minor CAD, no severe stenoses. Did not tolerate intermittent HD. Not yet restarted on CRRT. Last ABG showed persistent metabolic acidosis despite hyperventilation (pH 7.318, pCO2 28.8). Excellent oxygenation. Lytes OK.  Inpatient Medications    Scheduled Meds: . aspirin  81 mg Oral Daily  . chlorhexidine gluconate (MEDLINE KIT)  15 mL Mouth Rinse BID  . Chlorhexidine Gluconate Cloth  6 each Topical Daily  . heparin injection (subcutaneous)  5,000 Units Subcutaneous Q8H  . insulin aspart  0-20 Units Subcutaneous Q4H  . insulin aspart  6 Units Subcutaneous Q6H  . insulin glargine  30 Units Subcutaneous Daily  . ipratropium-albuterol  3 mL Nebulization Q6H  . mouth rinse  15 mL Mouth Rinse 10 times per day  . multivitamin  15 mL Per Tube Daily  . pantoprazole sodium  40 mg Per Tube Daily  . rosuvastatin  10 mg Per Tube QPM  . sodium chloride flush  3 mL Intravenous Q12H   Continuous Infusions: . sodium chloride 10 mL/hr at 10/20/18 0118  . norepinephrine (LEVOPHED) Adult infusion 10 mcg/min (10/20/18 0800)  . propofol (DIPRIVAN) infusion 5 mcg/kg/min (10/20/18 0800)   PRN Meds: sodium chloride, acetaminophen (TYLENOL) oral liquid 160 mg/5 mL, alteplase, bisacodyl, fentaNYL (SUBLIMAZE) injection, heparin, lidocaine (PF), lidocaine-prilocaine, pentafluoroprop-tetrafluoroeth, sodium chloride flush   Vital Signs    Vitals:   10/20/18 0700 10/20/18 0730 10/20/18 0759 10/20/18 0800  BP:   (!) 160/47   Pulse:  77 79 79  Resp: (!) 24 (!) 24 (!) 25 (!) 24  Temp:      TempSrc:      SpO2:  100% 100% 99%  Weight:      Height:        Intake/Output Summary (Last 24 hours) at 10/20/2018  0828 Last data filed at 10/20/2018 0800 Gross per 24 hour  Intake 888.67 ml  Output 450 ml  Net 438.67 ml   Filed Weights   10/19/18 1027 10/19/18 1530 10/20/18 0500  Weight: 130.3 kg 133 kg 134.5 kg    Telemetry    SR - Personally Reviewed  ECG    10/19/2018 1541h - Accelerated idioventricular or junctional rhythm, RBBB/LAFB (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   Neck: No JVD Cardiac: RRR, no murmurs, rubs, or gallops.  Respiratory: Clear to auscultation bilaterally. GI: Soft, nontender, non-distended  MS: No edema; No deformity. Neuro:  limited assessment, nonfocal Psych: cannot assess  Labs    Chemistry Recent Labs  Lab 10/19/18 0222 10/19/18 1555 10/20/18 0433  NA 135 134* 134*  K 4.0 3.9 4.6  CL 98 99 100  CO2 22 21* 22  GLUCOSE 214* 342* 196*  BUN 109* 108* 119*  CREATININE 5.59* 5.73* 6.52*  CALCIUM 10.6* 10.3 10.4*  PROT  --  7.2  --   ALBUMIN 2.0* 2.0* 2.0*  AST  --  72*  --   ALT  --  70*  --   ALKPHOS  --  200*  --   BILITOT  --  0.5  --   GFRNONAA 8* 7* 6*  GFRAA 9* 8* 7*  ANIONGAP 15 14 12  Hematology Recent Labs  Lab 10/19/18 0222 10/19/18 1555 10/20/18 0433  WBC 21.2* 23.7* 22.9*  RBC 3.31* 3.47* 3.25*  HGB 8.4* 8.7* 8.1*  HCT 29.6* 32.0* 28.9*  MCV 89.4 92.2 88.9  MCH 25.4* 25.1* 24.9*  MCHC 28.4* 27.2* 28.0*  RDW 15.3 15.6* 15.5  PLT 203 228 251    Cardiac Enzymes Recent Labs  Lab 10/17/18 0728  TROPONINI 0.03*   No results for input(s): TROPIPOC in the last 168 hours.   BNPNo results for input(s): BNP, PROBNP in the last 168 hours.   DDimer No results for input(s): DDIMER in the last 168 hours.   Radiology    Dg Chest Port 1 View  Result Date: 10/19/2018 CLINICAL DATA:  Cardiac arrest. EXAM: PORTABLE CHEST 1 VIEW COMPARISON:  Chest x-ray from same day at 14:56. FINDINGS: Unchanged tracheostomy tube,  enteric tube, and right internal jugular catheter. Stable cardiomegaly and central pulmonary vascular congestion. New hazy opacities throughout both lungs. No pleural effusion or pneumothorax. No acute osseous abnormality. IMPRESSION: 1. New hazy opacities throughout both lungs, suspicious for pulmonary edema. Electronically Signed   By: Titus Dubin M.D.   On: 10/19/2018 16:20   Dg Chest Port 1 View  Result Date: 10/19/2018 CLINICAL DATA:  Post tracheostomy. EXAM: PORTABLE CHEST 1 VIEW COMPARISON:  None. FINDINGS: New tracheostomy tube projects over the upper trachea. No evidence of pneumomediastinum. No pneumothorax. Right IJ catheter tip terminates in the distal SVC. The left IJ approach central line has been removed. Gastric tube extends below the left hemidiaphragm. Lung volumes remain low. There is stable cardiomegaly with central vascular congestion and left basilar atelectasis. IMPRESSION: Low lung volumes with stable cardiomegaly and central vascular congestion. Support lines and tubes as above in satisfactory position. No pneumothorax or pneumomediastinum. Electronically Signed   By: Ashley Royalty M.D.   On: 10/19/2018 15:13    Cardiac Studies   10/19/2018 ECHO - Left ventricle: The cavity size was normal. There was moderate   concentric hypertrophy. Systolic function was normal. The   estimated ejection fraction was in the range of 60% to 65%.   Although no diagnostic regional wall motion abnormality was   identified, this possibility cannot be completely excluded on the   basis of this study. Features are consistent with a pseudonormal   left ventricular filling pattern, with concomitant abnormal   relaxation and increased filling pressure (grade 2 diastolic   dysfunction). - Mitral valve: Calcified annulus.  10/19/2018 CATH  Prox RCA to Mid RCA lesion is 55% stenosed.  Prox LAD to Mid LAD lesion is 30% stenosed.  LV end diastolic pressure is mildly elevated.    SUMMARY:  Angiographically moderate single vessel CAD - pRCA.  Not enough to explain PEA arrest.  Hyperdymanic LV with jack-hammer pulse.  LVEDP 16 mmhg despite appearance of volume depletion.  Respiratory Acidosis - pH 2.49, pCO2 55 -> (Vent rate increased by RT).  RECOMMENDATIONS:   Return to CCU for continued care - consider non-ischemic etiology for PEA.  Consider volume resuscitation / transfusion.  Recommend Aspirin 80m daily for moderate CAD.   Patient Profile     57y.o. female with advanced CKD with recurrent PEA arrest, acute hypercapnic respiratory failure, transient episode of polymorphic VT  Assessment & Plan    1. PEA arrest: no meaningful CAD or structural cardiac problem other than LVH. Suspect was due to metabolic causes, primarily acidosis (combined respiratory and metabolic). 2. ARF:  Still with substantial metabolic acidosis despite hyperventilation  as of last evening ECG. No ABG done yet today. 3. Acute on chronic hypercapnic failure:  Now low CO2 levels with increased minute ventilation. 4. Morbid obesity: probable obesity-hypoventilation sd.  [facility] will sign off.   Medication Recommendations:  ASA 81 mg daily, rosuvastatin 10 mg daily Other recommendations (labs, testing, etc):  Aggressive correction of metabolic abnormalities (via HD) Follow up as an outpatient:  Please call when making DC plans  For questions or updates, please contact [facility] Please consult [email or web address] for contact info under        Signed, Sanda Klein, MD  10/20/2018, 8:28 AM

## 2018-10-20 NOTE — Progress Notes (Signed)
NAME:  Catherine Williams, MRN:  [id number], DOB:  August 15, 1961, LOS: 31 ADMISSION DATE:  10/07/2018, CONSULTATION DATE:  10/07/2018 REFERRING MD:  Dr. Benjamine Mola , CHIEF COMPLAINT:  Cardiac Arrest    Brief History   57 year old obese woman with OSA and CKD stage V admitted 10/20 after PEA cardiac arrest. Underwent hypothermia protocol and started on CRRT  10/26  sustained  brief PEA arrest  related to mucous plugging, underwent bronchoscopy 10/30 brief resp/PEA arrest -mucus plugging again , bronchoscopy minimal blood + mucus suctioned out  Past Medical History  Diastolic HF (EF 23-76, E8BT), COPD, PVD, Chronic Kidney Disease (Placement of Fistula on 10/17)   Significant Hospital Events   10/20 > Presents to ED s/p Cardiac Arrest  10/22 rewarmed at 1pm 10/24 - not awake enough to extubate but did follow commands  10/25 - sister from [place]. [place] at bedside. Daugther at bedside. Both very concerned she is not fully arousable. Per RN/family - can arouse to deep voice and track and nod and follow simple commands very briefly. Then falls asleep. Only 100cc./h urine so far. Per family - s/p LUE fistula recently in anticipation of future HD needs. Baseline creat 3.8s. Creat up at 4.75m%. Office renal doc -> Dr CMarval Regal    10/26 - 2 min PEA arrest with x  1 epi last night. Related to mucus plugging seen in ET tube via bronchoscopy. Sp BAL in RML Started on CRRT overnight -> This aM on CRRT, 40% fio2, PRVC and fentanyl and levophed gtt -> RASS -1 and followed simple commands. Seems weak 10/27 -  2L off with CRRT.  Off levophed 10/17/2018 status post second PEA arrest ? Mucus plugging , CRRT stopped, bronch unimpressive  Consults: date of consult/date signed off & final recs:  Cardiology 10/20 reconsult on 10/17/2018 PCCM 10/20  Procedures (surgical and bedside):  ETT 10/20 >> Left IJ 10/20 >>  Right IJ hemodialysis catheter 10/12/2018>>  Significant Diagnostic Tests:   CT Head 10/20  >>neg ECHO 10/21 >> nml LVEF, gr 2 DD EEG 10/21 >> slowing. No epileptiform activity.  Venous doppler legs 10/21 > no evidence DVT HD cath 10/25 - start CRRT  Micro Data:  Blood 10/20 >>ng Sputum 10/20 >>ng Urine 10/28 >>  Serratia marcescens resp 10/25 >> ng   Antimicrobials:  Cefepime 10/28 >>10/31 ceftx 10/31 >>   SUBJECTIVE/OVERNIGHT/INTERVAL HX   Cardiac arrest yesterday No events overnight No new complaints  Objective   Blood pressure (!) 160/47, pulse 80, temperature 99.2 F (37.3 C), temperature source Oral, resp. rate (!) 21, height _0  (1.575 m), weight 134.5 kg, SpO2 100 %. CVP:  [9 mmHg-30 mmHg] 11 mmHg  Vent Mode: PRVC FiO2 (%):  [40 %-100 %] 50 % Set Rate:  [24 bmp-30 bmp] 24 bmp Vt Set:  [400 mL] 400 mL PEEP:  [5 cmH20] 5 cmH20 Plateau Pressure:  [20 cDVV61-60cmH20] 21 cmH20   Intake/Output Summary (Last 24 hours) at 10/20/2018 1202 Last data filed at 10/20/2018 1100 Gross per 24 hour  Intake 938.09 ml  Output 400 ml  Net 538.09 ml   Filed Weights   10/19/18 1027 10/19/18 1530 10/20/18 0500  Weight: 130.3 kg 133 kg 134.5 kg   General: acutely ill appearing female, NAD, sedated on vent HEENT: [place]/AT, PERRL, EOM-I and MMM Heart: RRR, Nl S1/S2 and -M/R/G Lung: Coarse BS diffusely Abdomen: Soft, NT, ND and +BS Ext: -edema and -tenderness Skin: Intact  Assessment & Plan:   Acute Hypercarbic Respiratory  Failure suspected secondary to decompensated Heart Failure   H/O COPD, OSA on CPAP at HS  Recurrent respiratory arrest secondary to blood clots /mucous plugs - Hold off weaning for today given arrest from yesterday - Titrate O2 for sat of 88-92% - Pulmonary hygiene - Hypertonic saline  V.Fib/PEA Cardiac Arrest - elevated troponin as expected. No evidence of DVT.  Decompensated Heart Failure  H/O Diastolic HF (EF 98-33, A2NK) S/p repeat arrest 10/12/18 due to mucus plug Hypotension post PEA arrest - Titrate levophed for BP support - May need  cath after more stable on Monday  Acute on Chronic Stage IV Kidney Disease  Baseline Crt high 3s. . Recent placement of UE Fistula . - CRRT per renal - Will need a perm cath when more stable  Fever ? HCAP , Serratia UTI - WBC count rose to 22 and now decreasing again - Continue Rocephin  - DC central line  Hypoglycemia -resolved H/O DM, now uncontrolled CBG (last 3)  Recent Labs    10/20/18 0409 10/20/18 0732 10/20/18 1128  GLUCAP 168* 170* 150*   -Off insulin drip, increase Lantus to 30 units daily -on 60 units at home - SSI resistant scale  Acute Encephalopathy  - Arousable and following command  Constipation - Restart TF - Laxative as needed  Disposition / Summary of Today's Plan 10/20/18   Hold weaning today, CRRT and resume TF.     Code Status: FC Family Communication: Daughters updated daily  The patient is critically ill with multiple organ systems failure and requires high complexity decision making for assessment and support, frequent evaluation and titration of therapies, application of advanced monitoring technologies and extensive interpretation of multiple databases.   Critical Care Time devoted to patient care services described in this note is  34  Minutes. This time reflects time of care of this signee Dr Jennet Maduro. This critical care time does not reflect procedure time, or teaching time or supervisory time of PA/NP/Med student/Med Resident etc but could involve care discussion time.  Rush Farmer, M.D. [facility] Pulmonary/Critical Care Medicine. Pager: [phone]. After hours pager: [phone].  10/20/2018, 12:02 PM

## 2018-10-20 NOTE — Plan of Care (Signed)
Care plan reviewed.

## 2018-10-21 LAB — RENAL FUNCTION PANEL
ANION GAP: 16 — AB (ref 5–15)
Albumin: 1.9 g/dL — ABNORMAL LOW (ref 3.5–5.0)
Albumin: 1.8 g/dL — ABNORMAL LOW (ref 3.5–5.0)
Anion gap: 15 (ref 5–15)
BUN: 137 mg/dL — ABNORMAL HIGH (ref 6–20)
BUN: 146 mg/dL — ABNORMAL HIGH (ref 6–20)
CALCIUM: 10.2 mg/dL (ref 8.9–10.3)
CHLORIDE: 97 mmol/L — AB (ref 98–111)
CO2: 21 mmol/L — ABNORMAL LOW (ref 22–32)
CO2: 23 mmol/L (ref 22–32)
CREATININE: 7.93 mg/dL — AB (ref 0.44–1.00)
Calcium: 10.2 mg/dL (ref 8.9–10.3)
Chloride: 98 mmol/L (ref 98–111)
Creatinine, Ser: 7.64 mg/dL — ABNORMAL HIGH (ref 0.44–1.00)
GFR, EST AFRICAN AMERICAN: 6 mL/min — AB (ref >60)
GFR, EST AFRICAN AMERICAN: 6 mL/min — AB (ref >60)
GFR, EST NON AFRICAN AMERICAN: 5 mL/min — AB (ref >60)
GFR, EST NON AFRICAN AMERICAN: 5 mL/min — AB (ref >60)
Glucose, Bld: 234 mg/dL — ABNORMAL HIGH (ref 70–99)
Glucose, Bld: 222 mg/dL — ABNORMAL HIGH (ref 70–99)
PHOSPHORUS: 5.5 mg/dL — AB (ref 2.5–4.6)
Phosphorus: 5.2 mg/dL — ABNORMAL HIGH (ref 2.5–4.6)
Potassium: 4.6 mmol/L (ref 3.5–5.1)
Potassium: 5 mmol/L (ref 3.5–5.1)
SODIUM: 136 mmol/L (ref 135–145)
Sodium: 134 mmol/L — ABNORMAL LOW (ref 135–145)

## 2018-10-21 LAB — CBC
HCT: 26.4 % — ABNORMAL LOW (ref 36.0–46.0)
Hemoglobin: 7.4 g/dL — ABNORMAL LOW (ref 12.0–15.0)
MCH: 24.7 pg — ABNORMAL LOW (ref 26.0–34.0)
MCHC: 28 g/dL — ABNORMAL LOW (ref 30.0–36.0)
MCV: 88 fL (ref 80.0–100.0)
PLATELETS: 271 10*3/uL (ref 150–400)
RBC: 3 MIL/uL — AB (ref 3.87–5.11)
RDW: 15.6 % — ABNORMAL HIGH (ref 11.5–15.5)
WBC: 18.5 10*3/uL — ABNORMAL HIGH (ref 4.0–10.5)
nRBC: 0.3 % — ABNORMAL HIGH (ref 0.0–0.2)

## 2018-10-21 LAB — GLUCOSE, CAPILLARY
GLUCOSE-CAPILLARY: 152 mg/dL — AB (ref 70–99)
GLUCOSE-CAPILLARY: 180 mg/dL — AB (ref 70–99)
GLUCOSE-CAPILLARY: 201 mg/dL — AB (ref 70–99)
Glucose-Capillary: 181 mg/dL — ABNORMAL HIGH (ref 70–99)
Glucose-Capillary: 219 mg/dL — ABNORMAL HIGH (ref 70–99)
Glucose-Capillary: 236 mg/dL — ABNORMAL HIGH (ref 70–99)

## 2018-10-21 LAB — MAGNESIUM: MAGNESIUM: 2.9 mg/dL — AB (ref 1.7–2.4)

## 2018-10-21 LAB — APTT: APTT: 33 s (ref 24–36)

## 2018-10-21 MED ORDER — SODIUM CHLORIDE 0.9 % IV SOLN
1.0000 g | INTRAVENOUS | Status: DC
  Administered 2018-10-21: 1 g via INTRAVENOUS
  Filled 2018-10-21 (×2): qty 1

## 2018-10-21 NOTE — Progress Notes (Signed)
Pharmacy Antibiotic Note  Catherine Williams is a 57 y.o. female admitted on 10/07/2018 with witnessed arrest. Pharmacy now being consulted for cefepime dosing for possible sepsis.  Tmax 100.2, wbc 18.5. Previously on CRRT the iHD, following off CRRT now. May start tomorrow.  Plan: Cefepime 1g q24h Follow nephrology plans regarding CRRT vs iHD. Adjust dose accordingly  Height: 5\' 2"  (157.5 cm) Weight: (!) 303 lb 2.1 oz (137.5 kg) IBW/kg (Calculated) : 50.1  Temp (24hrs), Avg:99.5 F (37.5 C), Min:98.8 F (37.1 C), Max:100.2 F (37.9 C)  Recent Labs  Lab 10/17/18 0728  10/18/18 0258  10/19/18 0222 10/19/18 1555 10/20/18 0433 10/20/18 1604 10/21/18 0450  WBC  --   --  22.2*  --  21.2* 23.7* 22.9*  --  18.5*  CREATININE 2.69*   < > 4.24*   < > 5.59* 5.73* 6.52* 7.19* 7.64*  LATICACIDVEN 1.2  --   --   --   --   --   --   --   --    < > = values in this interval not displayed.    Estimated Creatinine Clearance: 10.7 mL/min (A) (by C-G formula based on SCr of 7.64 mg/dL (H)).    No Known Allergies  Antimicrobials this admission: 10/28 cefepime>>10/31, 11/3>> 10/31 CTX>> 11/1   Microbiology results: 10/20 Blood cx: NGTD 10/20 Sputum cx: normal resp flora 10/25 sputum -normal flora 10/28 urine - Serratia  Thank you for allowing pharmacy to be a part of this patient's care.   Claiborne Billings, PharmD PGY2 Cardiology Pharmacy Resident Phone [phone] Please check AMION for all Pharmacist numbers by unit 10/21/2018 12:42 PM

## 2018-10-21 NOTE — Progress Notes (Signed)
Manually adjusted for daylight savings time.

## 2018-10-21 NOTE — Progress Notes (Signed)
Dr. Nelda Marseille at bedside to assess pt, updated on pt condition. MD informed at that antibiotics were d/c, pt with continual high WBC and low grade fever. MD to restart antibiotics. MD also made aware of HD cath day 10, no action to be taken at this time, will be reassessed.

## 2018-10-21 NOTE — Plan of Care (Signed)
Care plan reviewed.

## 2018-10-21 NOTE — Progress Notes (Addendum)
NAME:  Catherine Williams, MRN:  [id number], DOB:  1961-04-14, LOS: 4 ADMISSION DATE:  10/07/2018, CONSULTATION DATE:  10/07/2018 REFERRING MD:  Dr. Benjamine Mola , CHIEF COMPLAINT:  Cardiac Arrest    Brief History   57 year old obese woman with OSA and CKD stage V admitted 10/20 after PEA cardiac arrest. Underwent hypothermia protocol and started on CRRT  10/26  sustained  brief PEA arrest  related to mucous plugging, underwent bronchoscopy 10/30 brief resp/PEA arrest -mucus plugging again , bronchoscopy minimal blood + mucus suctioned out  Past Medical History  Diastolic HF (EF 52-77, O2UM), COPD, PVD, Chronic Kidney Disease (Placement of Fistula on 10/17)   Significant Hospital Events   10/20 > Presents to ED s/p Cardiac Arrest  10/22 rewarmed at 1pm 10/24 - not awake enough to extubate but did follow commands  10/25 - sister from [place]. [place] at bedside. Daugther at bedside. Both very concerned she is not fully arousable. Per RN/family - can arouse to deep voice and track and nod and follow simple commands very briefly. Then falls asleep. Only 100cc./h urine so far. Per family - s/p LUE fistula recently in anticipation of future HD needs. Baseline creat 3.8s. Creat up at 4.97m%. Office renal doc -> Dr CMarval Regal    10/26 - 2 min PEA arrest with x  1 epi last night. Related to mucus plugging seen in ET tube via bronchoscopy. Sp BAL in RML Started on CRRT overnight -> This aM on CRRT, 40% fio2, PRVC and fentanyl and levophed gtt -> RASS -1 and followed simple commands. Seems weak 10/27 -  2L off with CRRT.  Off levophed 10/17/2018 status post second PEA arrest ? Mucus plugging , CRRT stopped, bronch unimpressive  Consults: date of consult/date signed off & final recs:  Cardiology 10/20 reconsult on 10/17/2018 PCCM 10/20  Procedures (surgical and bedside):  ETT 10/20 >> Left IJ 10/20 >>  Right IJ hemodialysis catheter 10/12/2018>>  Significant Diagnostic Tests:   CT Head 10/20  >>neg ECHO 10/21 >> nml LVEF, gr 2 DD EEG 10/21 >> slowing. No epileptiform activity.  Venous doppler legs 10/21 > no evidence DVT HD cath 10/25 - start CRRT  Micro Data:  Blood 10/20 >>ng Sputum 10/20 >>ng Urine 10/28 >>  Serratia marcescens resp 10/25 >> ng  Antimicrobials:  Cefepime 10/28 >>10/31>>>11/3 ceftx 10/31 >>11/2  SUBJECTIVE/OVERNIGHT/INTERVAL HX   No events overnight, no new complaints Tolerating some PS weaning this AM  Objective   Blood pressure (!) 138/49, pulse 90, temperature 98.8 F (37.1 C), temperature source Axillary, resp. rate (!) 33, height _0  (1.575 m), weight (!) 137.5 kg, SpO2 100 %. CVP:  [9 mmHg-12 mmHg] 9 mmHg  Vent Mode: PRVC FiO2 (%):  [40 %] 40 % Set Rate:  [24 bmp] 24 bmp Vt Set:  [400 mL] 400 mL PEEP:  [5 cmH20] 5 cmH20 Plateau Pressure:  [17 cmH20-22 cmH20] 22 cmH20   Intake/Output Summary (Last 24 hours) at 10/21/2018 1224 Last data filed at 10/21/2018 1200 Gross per 24 hour  Intake 1532.74 ml  Output 50 ml  Net 1482.74 ml   Filed Weights   10/19/18 1530 10/20/18 0500 10/21/18 0500  Weight: 133 kg 134.5 kg (!) 137.5 kg   General: Acutely on chronically ill appearing female, NAD HEENT: [place]/AT, PERRL, EOM-I and MMM Heart: RRR, Nl S1/S2 and -M/R/G Lung: Coarse BS diffusely Abdomen: Soft, NT, ND and +BS Ext: -edema and -tenderness Skin: Intact  Assessment & Plan:   Acute Hypercarbic Respiratory  Failure suspected secondary to decompensated Heart Failure   H/O COPD, OSA on CPAP at HS  Recurrent respiratory arrest secondary to blood clots /mucous plugs - Begin PS trials today - Titrate O2 for sat of 88-92% - Hold off TC for today - Pulmonary hygienes  - Hold hypertonic saline  V.Fib/PEA Cardiac Arrest - elevated troponin as expected. No evidence of DVT.  Decompensated Heart Failure  H/O Diastolic HF (EF 29-79, G9QJ) S/p repeat arrest 10/12/18 due to mucus plug Hypotension post PEA arrest - Titrate levophed for BP  support - Cath clean per cards  Acute on Chronic Stage IV Kidney Disease  Baseline Crt high 3s. . Recent placement of UE Fistula . - CRRT per renal - Will need a perm cath when more stable - Today is day 10 of temp cath  Fever ? HCAP , Serratia UTI - WBC count rose to 22 and now decreasing again - D/C rocephin and start cefepime - Hold of line placement for now  Hypoglycemia -resolved H/O DM, now uncontrolled CBG (last 3)  Recent Labs    10/21/18 0356 10/21/18 0750 10/21/18 1139  GLUCAP 181* 219* 236*   - Off insulin drip, increased Lantus to 30 units daily -on 60 units at home - SSI resistant scale  Acute Encephalopathy  - Arousable and following command  Constipation - Restart TF - Laxative as needed  Disposition / Summary of Today's Plan 10/21/18   Begin weaning trials today, HD per renal, hold in the ICU     Code Status: FC Family Communication: Daughters updated daily  The patient is critically ill with multiple organ systems failure and requires high complexity decision making for assessment and support, frequent evaluation and titration of therapies, application of advanced monitoring technologies and extensive interpretation of multiple databases.   Critical Care Time devoted to patient care services described in this note is  32  Minutes. This time reflects time of care of this signee Dr Jennet Maduro. This critical care time does not reflect procedure time, or teaching time or supervisory time of PA/NP/Med student/Med Resident etc but could involve care discussion time.  Rush Farmer, M.D. [facility] Pulmonary/Critical Care Medicine. Pager: [phone]. After hours pager: [phone].

## 2018-10-21 NOTE — Progress Notes (Signed)
[facility] Progress Note    Assessment/ Plan:   57 year old black female with numerous medical problems including advanced CKD-status post AV fistula. She is now status post cardiac arrest and has had acute on chronic renal failure  1.Renal-acute on advanced chronic renal failure after cardiac arrest.Initially, she was making good urine and kidney function was stable. Now, after blood pressure is controlled possibly has had some relative hypotension giving some ATN with worsening numbers. CRRT initiated10/25-10/30 (AM) ;working possibly toward extubation but has had 2nd PEA arrest. - No e/o renal recovery and she will be dialysis dependent now.  -  Still  on Levophed   - did not tolerate iHD 11/1 -> only 15 min and went into PEA arrest x2 + VT's --> emergent cath (no PCI as not severe enough to explain the arrest)  - Plan on restarting CRRT  Mon -> no acute indication today. I am very hesitant to challenge with iHD and certainly wouldn't try again until she's off Levophed.   -Holding off on VIRconversion to TC with the leukocytosis but may  need to change location of temp cath (today is day 9)  2. Hypertension/volume- fortunately not getting much volume; consider euvolemic for now. She is actually hypotensive now. 3.Hyperkalemia- improved but will trend up off RRT 4. Anemia--supportive care for now, dropping- latest iron stores good- startedESA  5.Status post arrest-I guess thinking due to OSA-not sure if more significant cardiac work-up is required- now has happened again - cath prox to mid RCA 55% not enough to explain PEA arrest per cards.  Subjective:   No events overnight; sister bedside.  On Precedex.  Still on Levo.   Objective:   BP (!) 138/49   Pulse 80   Temp 98.8 F (37.1 C) (Axillary)   Resp (!) 24   Ht 5' 2"  (1.575 m)   Wt (!) 137.5 kg   SpO2 100%   BMI 55.44 kg/m   Intake/Output Summary (Last 24 hours) at 10/21/2018  4235 Last data filed at 10/21/2018 0700 Gross per 24 hour  Intake 1436.58 ml  Output 100 ml  Net 1336.58 ml   Weight change: 7.2 kg  Physical Exam: General: sedated, intubated- obese, pleasant Heart: RRR Lungs: dec BS at bases Abdomen: obese, soft non tender Extremities: pitting edemaimproved Dialysis Access: right IJ vascath (placed 10/25), bruit still present in LUA AVF  Imaging: Dg Chest Port 1 View  Result Date: 10/19/2018 CLINICAL DATA:  Cardiac arrest. EXAM: PORTABLE CHEST 1 VIEW COMPARISON:  Chest x-ray from same day at 14:56. FINDINGS: Unchanged tracheostomy tube, enteric tube, and right internal jugular catheter. Stable cardiomegaly and central pulmonary vascular congestion. New hazy opacities throughout both lungs. No pleural effusion or pneumothorax. No acute osseous abnormality. IMPRESSION: 1. New hazy opacities throughout both lungs, suspicious for pulmonary edema. Electronically Signed   By: Titus Dubin M.D.   On: 10/19/2018 16:20   Dg Chest Port 1 View  Result Date: 10/19/2018 CLINICAL DATA:  Post tracheostomy. EXAM: PORTABLE CHEST 1 VIEW COMPARISON:  None. FINDINGS: New tracheostomy tube projects over the upper trachea. No evidence of pneumomediastinum. No pneumothorax. Right IJ catheter tip terminates in the distal SVC. The left IJ approach central line has been removed. Gastric tube extends below the left hemidiaphragm. Lung volumes remain low. There is stable cardiomegaly with central vascular congestion and left basilar atelectasis. IMPRESSION: Low lung volumes with stable cardiomegaly and central vascular congestion. Support lines and tubes as above in satisfactory position. No pneumothorax or  pneumomediastinum. Electronically Signed   By: Ashley Royalty M.D.   On: 10/19/2018 15:13    Labs: BMET Recent Labs  Lab 10/17/18 1647 10/18/18 0258 10/18/18 2106 10/19/18 0222 10/19/18 1555 10/20/18 0433 10/20/18 1604 10/21/18 0450  NA 136 135 137 135 134* 134* 135  134*  K 4.1 3.9 4.1 4.0 3.9 4.6 4.6 4.6  CL 98 100 100 98 99 100 99 97*  CO2 23 24 23 22  21* 22 22 21*  GLUCOSE 303* 277* 143* 214* 342* 196* 203* 234*  BUN 65* 80* 98* 109* 108* 119* 125* 137*  CREATININE 3.42* 4.24* 5.21* 5.59* 5.73* 6.52* 7.19* 7.64*  CALCIUM 10.9* 10.4* 10.8* 10.6* 10.3 10.4* 10.5* 10.2  PHOS 3.1 3.2  3.2 3.4 3.5  --  4.8* 5.1* 5.5*   CBC Recent Labs  Lab 10/15/18 0405 10/16/18 0519 10/17/18 0414 10/18/18 0258 10/19/18 0222 10/19/18 1555 10/20/18 0433 10/21/18 0450  WBC 13.1* 12.7* 19.5* 22.2* 21.2* 23.7* 22.9* 18.5*  NEUTROABS 9.9* 10.3* 14.3* 15.5*  --   --   --   --   HGB 8.5* 9.0* 9.4* 8.8* 8.4* 8.7* 8.1* 7.4*  HCT 31.8* 33.7* 35.2* 31.5* 29.6* 32.0* 28.9* 26.4*  MCV 90.9 92.6 92.4 91.3 89.4 92.2 88.9 88.0  PLT 196 203 214 196 203 228 251 271    Medications:    . aspirin  81 mg Oral Daily  . chlorhexidine gluconate (MEDLINE KIT)  15 mL Mouth Rinse BID  . Chlorhexidine Gluconate Cloth  6 each Topical Daily  . heparin injection (subcutaneous)  5,000 Units Subcutaneous Q8H  . insulin aspart  0-20 Units Subcutaneous Q4H  . insulin aspart  6 Units Subcutaneous Q6H  . insulin glargine  30 Units Subcutaneous Daily  . ipratropium-albuterol  3 mL Nebulization Q6H  . mouth rinse  15 mL Mouth Rinse 10 times per day  . multivitamin  15 mL Per Tube Daily  . pantoprazole sodium  40 mg Per Tube Daily  . rosuvastatin  10 mg Per Tube QPM  . sodium chloride flush  3 mL Intravenous Q12H      Otelia Santee, MD 10/21/2018, 8:26 AM

## 2018-10-21 NOTE — Progress Notes (Signed)
Nephrology at bedside to assess pt, updated MD on pt condition. MD updated pt daughter on plan of care, CRRT to be reassessed tomorrow for restart. Pt to be bladder scanned per MD, cath to be placed if >400cc urine in bladder.

## 2018-10-22 ENCOUNTER — Encounter (HOSPITAL_COMMUNITY): Payer: Self-pay | Admitting: Cardiology

## 2018-10-22 LAB — RENAL FUNCTION PANEL
Albumin: 1.9 g/dL — ABNORMAL LOW (ref 3.5–5.0)
Albumin: 1.9 g/dL — ABNORMAL LOW (ref 3.5–5.0)
Anion gap: 16 — ABNORMAL HIGH (ref 5–15)
Anion gap: 9 (ref 5–15)
BUN: 157 mg/dL — AB (ref 6–20)
BUN: 149 mg/dL — AB (ref 6–20)
CALCIUM: 9.6 mg/dL (ref 8.9–10.3)
CHLORIDE: 107 mmol/L (ref 98–111)
CO2: 21 mmol/L — AB (ref 22–32)
CO2: 19 mmol/L — AB (ref 22–32)
CREATININE: 7.91 mg/dL — AB (ref 0.44–1.00)
Calcium: 10.1 mg/dL (ref 8.9–10.3)
Chloride: 98 mmol/L (ref 98–111)
Creatinine, Ser: 8.4 mg/dL — ABNORMAL HIGH (ref 0.44–1.00)
GFR calc Af Amer: 5 mL/min — ABNORMAL LOW (ref >60)
GFR calc non Af Amer: 5 mL/min — ABNORMAL LOW (ref >60)
GFR calc non Af Amer: 5 mL/min — ABNORMAL LOW (ref >60)
GFR, EST AFRICAN AMERICAN: 6 mL/min — AB (ref >60)
GLUCOSE: 186 mg/dL — AB (ref 70–99)
Glucose, Bld: 196 mg/dL — ABNORMAL HIGH (ref 70–99)
Phosphorus: 5.6 mg/dL — ABNORMAL HIGH (ref 2.5–4.6)
Phosphorus: 5.8 mg/dL — ABNORMAL HIGH (ref 2.5–4.6)
Potassium: 5 mmol/L (ref 3.5–5.1)
Potassium: 4.9 mmol/L (ref 3.5–5.1)
SODIUM: 135 mmol/L (ref 135–145)
SODIUM: 135 mmol/L (ref 135–145)

## 2018-10-22 LAB — POCT ACTIVATED CLOTTING TIME
ACTIVATED CLOTTING TIME: 136 s
ACTIVATED CLOTTING TIME: 136 s
ACTIVATED CLOTTING TIME: 142 s
ACTIVATED CLOTTING TIME: 147 s
Activated Clotting Time: 158 seconds
Activated Clotting Time: 147 seconds

## 2018-10-22 LAB — GLUCOSE, CAPILLARY
GLUCOSE-CAPILLARY: 161 mg/dL — AB (ref 70–99)
GLUCOSE-CAPILLARY: 175 mg/dL — AB (ref 70–99)
Glucose-Capillary: 211 mg/dL — ABNORMAL HIGH (ref 70–99)
Glucose-Capillary: 272 mg/dL — ABNORMAL HIGH (ref 70–99)
Glucose-Capillary: 161 mg/dL — ABNORMAL HIGH (ref 70–99)
Glucose-Capillary: 178 mg/dL — ABNORMAL HIGH (ref 70–99)
Glucose-Capillary: 198 mg/dL — ABNORMAL HIGH (ref 70–99)
Glucose-Capillary: 207 mg/dL — ABNORMAL HIGH (ref 70–99)

## 2018-10-22 LAB — MAGNESIUM: MAGNESIUM: 2.7 mg/dL — AB (ref 1.7–2.4)

## 2018-10-22 LAB — APTT: APTT: 32 s (ref 24–36)

## 2018-10-22 MED ORDER — DEXTROSE 5 % IV SOLN
500.0000 mg | INTRAVENOUS | Status: DC
  Filled 2018-10-22: qty 0.5

## 2018-10-22 MED ORDER — HEPARIN SODIUM (PORCINE) 1000 UNIT/ML DIALYSIS
1000.0000 [IU] | INTRAMUSCULAR | Status: DC | PRN
  Filled 2018-10-22: qty 4
  Filled 2018-10-22: qty 6

## 2018-10-22 MED ORDER — PRISMASOL B22GK 4/0 22-4 MEQ/L IV SOLN
INTRAVENOUS | Status: DC
  Administered 2018-10-22 – 2018-10-23 (×7): via INTRAVENOUS_CENTRAL
  Filled 2018-10-22 (×15): qty 5000

## 2018-10-22 MED ORDER — HEPARIN (PORCINE) 2000 UNITS/L FOR CRRT
INTRAVENOUS_CENTRAL | Status: DC | PRN
  Administered 2018-10-22 – 2018-10-23 (×2): via INTRAVENOUS_CENTRAL
  Filled 2018-10-22: qty 1000

## 2018-10-22 MED ORDER — PRISMASOL B22GK 4/0 22-4 MEQ/L REPLACEMENT SOLN
Status: DC
  Administered 2018-10-22 – 2018-10-23 (×2): via INTRAVENOUS_CENTRAL
  Filled 2018-10-22 (×6): qty 5000

## 2018-10-22 MED ORDER — SODIUM CHLORIDE 0.9 % IJ SOLN
250.0000 [IU]/h | INTRAMUSCULAR | Status: DC
  Administered 2018-10-22: 250 [IU]/h via INTRAVENOUS_CENTRAL
  Administered 2018-10-23: 2500 [IU]/h via INTRAVENOUS_CENTRAL
  Administered 2018-10-23: 1350 [IU]/h via INTRAVENOUS_CENTRAL
  Administered 2018-10-23: 2500 [IU]/h via INTRAVENOUS_CENTRAL
  Administered 2018-10-23: 1950 [IU]/h via INTRAVENOUS_CENTRAL
  Administered 2018-10-23: 2300 [IU]/h via INTRAVENOUS_CENTRAL
  Administered 2018-10-24: 2500 [IU]/h via INTRAVENOUS_CENTRAL
  Filled 2018-10-22 (×9): qty 2

## 2018-10-22 MED ORDER — HEPARIN BOLUS VIA INFUSION (CRRT)
1000.0000 [IU] | INTRAVENOUS | Status: DC | PRN
  Administered 2018-10-22 (×4): 1000 [IU] via INTRAVENOUS_CENTRAL
  Filled 2018-10-22: qty 1000

## 2018-10-22 MED ORDER — PATIROMER SORBITEX CALCIUM 8.4 G PO PACK
16.8000 g | PACK | Freq: Once | ORAL | Status: AC
  Administered 2018-10-22: 16.8 g via ORAL
  Filled 2018-10-22: qty 2

## 2018-10-22 MED ORDER — PRISMASOL B22GK 4/0 22-4 MEQ/L REPLACEMENT SOLN
Status: DC
  Administered 2018-10-22: 16:00:00 via INTRAVENOUS_CENTRAL
  Filled 2018-10-22 (×5): qty 5000

## 2018-10-22 MED ORDER — SODIUM CHLORIDE 0.9 % IV SOLN
2.0000 g | Freq: Two times a day (BID) | INTRAVENOUS | Status: DC
  Administered 2018-10-22 – 2018-10-23 (×3): 2 g via INTRAVENOUS
  Filled 2018-10-22 (×4): qty 2

## 2018-10-22 MED ORDER — DARBEPOETIN ALFA 100 MCG/0.5ML IJ SOSY
100.0000 ug | PREFILLED_SYRINGE | Freq: Once | INTRAMUSCULAR | Status: AC
  Administered 2018-10-22: 100 ug via INTRAVENOUS
  Filled 2018-10-22: qty 0.5

## 2018-10-22 NOTE — Progress Notes (Signed)
RT note: attempted SBT on patient this AM however RR increased to high 40s-50s.  Placed patient back on full support ventilator settings.  Will continue to monitor.

## 2018-10-22 NOTE — Progress Notes (Addendum)
[facility] NEPHROLOGY PROGRESS NOTE  Assessment/ Plan: Pt is a 57 y.o. yo female with advanced CKD status post AV fistula creation, admitted with cardiac arrest and acute on chronic renal failure.  #Acute on advance CKD after cardiac arrest: Patient now with borderline blood pressure, on and off pressors.  She has no urine output with worsening serum creatinine level.  Potassium level is borderline.  She required CRRT from 10/25-10/30 (AM).  Patient did not tolerate intermittent hemodialysis on 11/1, only 15 minutes and went into PEA arrest x2.  Patient underwent emergent cardiac cath with no PCI. I will hold off on CRRT today as volume status and electrolytes acceptable.  Monitor urine output, BMP and blood pressure.  If no improvement in renal function by tomorrow she will likely needs dialysis.  She currently has temporary catheter.   -If improvement in leukocytosis by tomorrow consider tunneled dialysis catheter. I will consult IR. -She has left AV fistula.  #Hypotension: She is off Levophed this morning.  Blood pressure borderline.  Continue to monitor.  #Hyperkalemia: Serum potassium level is borderline.  I will order a dose of patiromer.  #Anemia, multifactorial: Iron saturation 38%, hemoglobin 7.4.  I will order a dose of Aranesp.  #Cardiac arrest status post cardiac cath, mid RCA 55% not enough to explain PEA arrest per cardiology.  Currently intubated.  Addendum: discussed with ICU team, plan to possibly extubate the patient. High BUN, BP borderline. Pt is likely not able to tolerate intermittent HD. I will start with CRRT.  Subjective: Seen and examined at bedside.  On mechanical ventilation.  Review of systems limited. Objective Vital signs in last 24 hours: Vitals:   10/22/18 0630 10/22/18 0700 10/22/18 0744 10/22/18 0802  BP:   (!) 115/40   Pulse: 78 77 76   Resp: (!) 30 (!) 23 (!) 31   Temp:    99.3 F (37.4 C)  TempSrc:    Oral  SpO2: 100% 100% 99%    Weight:      Height:       Weight change: -3.2 kg  Intake/Output Summary (Last 24 hours) at 10/22/2018 0906 Last data filed at 10/22/2018 0700 Gross per 24 hour  Intake 1421.69 ml  Output -  Net 1421.69 ml       Labs: Basic Metabolic Panel: Recent Labs  Lab 10/21/18 0450 10/21/18 1542 10/22/18 0406  NA 134* 136 135  K 4.6 5.0 5.0  CL 97* 98 98  CO2 21* 23 21*  GLUCOSE 234* 222* 186*  BUN 137* 146* 157*  CREATININE 7.64* 7.93* 8.40*  CALCIUM 10.2 10.2 10.1  PHOS 5.5* 5.2* 5.6*   Liver Function Tests: Recent Labs  Lab 10/19/18 1555  10/21/18 0450 10/21/18 1542 10/22/18 0406  AST 72*  --   --   --   --   ALT 70*  --   --   --   --   ALKPHOS 200*  --   --   --   --   BILITOT 0.5  --   --   --   --   PROT 7.2  --   --   --   --   ALBUMIN 2.0*   < > 1.9* 1.8* 1.9*   < > = values in this interval not displayed.   No results for input(s): LIPASE, AMYLASE in the last 168 hours. No results for input(s): AMMONIA in the last 168 hours. CBC: Recent Labs  Lab 10/16/18 0519 10/17/18 0414 10/18/18  1308 10/19/18 0222 10/19/18 1555 10/20/18 0433 10/21/18 0450  WBC 12.7* 19.5* 22.2* 21.2* 23.7* 22.9* 18.5*  NEUTROABS 10.3* 14.3* 15.5*  --   --   --   --   HGB 9.0* 9.4* 8.8* 8.4* 8.7* 8.1* 7.4*  HCT 33.7* 35.2* 31.5* 29.6* 32.0* 28.9* 26.4*  MCV 92.6 92.4 91.3 89.4 92.2 88.9 88.0  PLT 203 214 196 203 228 251 271   Cardiac Enzymes: Recent Labs  Lab 10/17/18 0728  TROPONINI 0.03*   CBG: Recent Labs  Lab 10/21/18 1540 10/21/18 1948 10/21/18 2358 10/22/18 0358 10/22/18 0804  GLUCAP 201* 180* 175* 161* 178*    Iron Studies: No results for input(s): IRON, TIBC, TRANSFERRIN, FERRITIN in the last 72 hours. Studies/Results: No results found.  Medications: Infusions: . sodium chloride 10 mL/hr at 10/22/18 0700  . ceFEPime (MAXIPIME) IV Stopped (10/21/18 1458)  . dexmedetomidine (PRECEDEX) IV infusion 0.2 mcg/kg/hr (10/22/18 0700)  . feeding supplement  (VITAL HIGH PROTEIN) 1,000 mL (10/21/18 1322)  . norepinephrine (LEVOPHED) Adult infusion Stopped (10/22/18 0442)  . propofol (DIPRIVAN) infusion Stopped (10/20/18 1243)    Scheduled Medications: . aspirin  81 mg Oral Daily  . chlorhexidine gluconate (MEDLINE KIT)  15 mL Mouth Rinse BID  . Chlorhexidine Gluconate Cloth  6 each Topical Daily  . heparin injection (subcutaneous)  5,000 Units Subcutaneous Q8H  . insulin aspart  0-20 Units Subcutaneous Q4H  . insulin aspart  6 Units Subcutaneous Q6H  . insulin glargine  30 Units Subcutaneous Daily  . ipratropium-albuterol  3 mL Nebulization Q6H  . mouth rinse  15 mL Mouth Rinse 10 times per day  . multivitamin  15 mL Per Tube Daily  . pantoprazole sodium  40 mg Per Tube Daily  . rosuvastatin  10 mg Per Tube QPM  . sodium chloride flush  3 mL Intravenous Q12H    have reviewed scheduled and prn medications.  Physical Exam: General: Intubated, not following commands Heart:RRR, s1s2 nl Lungs: Coarse breath sound, no wheezing Abdomen:soft, Non-tender, non-distended Extremities:No edema Dialysis Access: Temporary dialysis catheter.  Yared Barefoot Prasad Selen Smucker 10/22/2018,9:06 AM  LOS: 15 days

## 2018-10-22 NOTE — Progress Notes (Signed)
NAME:  Catherine Williams, MRN:  [id number], DOB:  07-04-1961, LOS: 25 ADMISSION DATE:  10/07/2018, CONSULTATION DATE:  10/07/2018 REFERRING MD:  Dr. Benjamine Mola , CHIEF COMPLAINT:  Cardiac Arrest    Brief History   57 year old obese woman with OSA and CKD stage V admitted 10/20 after PEA cardiac arrest. Underwent hypothermia protocol and started on CRRT  10/26  sustained  brief PEA arrest  related to mucous plugging, underwent bronchoscopy 10/30 brief resp/PEA arrest -mucus plugging again , bronchoscopy minimal blood + mucus suctioned out  Past Medical History  Diastolic HF (EF 38-10, F7PZ), COPD, PVD, Chronic Kidney Disease (Placement of Fistula on 10/17)   Significant Hospital Events   10/20 > Presents to ED s/p Cardiac Arrest  10/22 rewarmed at 1pm 10/24 - not awake enough to extubate but did follow commands  10/25 - sister from [place]. [place] at bedside. Daugther at bedside. Both very concerned she is not fully arousable. Per RN/family - can arouse to deep voice and track and nod and follow simple commands very briefly. Then falls asleep. Only 100cc./h urine so far. Per family - s/p LUE fistula recently in anticipation of future HD needs. Baseline creat 3.8s. Creat up at 4.72m%. Office renal doc -> Dr CMarval Regal    10/26 - 2 min PEA arrest with x  1 epi last night. Related to mucus plugging seen in ET tube via bronchoscopy. Sp BAL in RML Started on CRRT overnight -> This aM on CRRT, 40% fio2, PRVC and fentanyl and levophed gtt -> RASS -1 and followed simple commands. Seems weak 10/27 -  2L off with CRRT.  Off levophed 10/17/2018 status post second PEA arrest ? Mucus plugging , CRRT stopped, bronch unimpressive 11/1 trach , PEA /brady arrest p-trach , no sig bleeding , cath neg  Consults: date of consult/date signed off & final recs:  Cardiology 10/20 reconsult on 10/17/2018 PCCM 10/20  Procedures (surgical and bedside):  ETT 10/20 >> Left IJ 10/20 >> 11/1 Right IJ hemodialysis catheter  10/12/2018>>  Significant Diagnostic Tests:   CT Head 10/20 >>neg ECHO 10/21 >> nml LVEF, gr 2 DD EEG 10/21 >> slowing. No epileptiform activity.  Venous doppler legs 10/21 > no evidence DVT HD cath 10/25 - start CRRT  Micro Data:  Blood 10/20 >>ng Sputum 10/20 >>ng Urine 10/28 >>  Serratia marcescens resp 10/25 >> ng  Antimicrobials:  Cefepime 10/28 >>10/31, 11/3 >> ceftx 10/31 >>11/2  SUBJECTIVE/OVERNIGHT/INTERVAL HX  Critically ill, off levophed, remains vented Afebrile On low dose precedex gtt  Objective   Blood pressure (!) 115/40, pulse 77, temperature 99.3 F (37.4 C), temperature source Oral, resp. rate (!) 30, height _0  (1.575 m), weight 134.3 kg, SpO2 100 %. CVP:  [9 mmHg-13 mmHg] 11 mmHg  Vent Mode: PRVC FiO2 (%):  [40 %] 40 % Set Rate:  [24 bmp] 24 bmp Vt Set:  [400 mL] 400 mL PEEP:  [5 cmH20] 5 cmH20 Pressure Support:  [16 cmH20] 16 cmH20 Plateau Pressure:  [18 cmH20-23 cmH20] 18 cmH20   Intake/Output Summary (Last 24 hours) at 10/22/2018 1001 Last data filed at 10/22/2018 0900 Gross per 24 hour  Intake 1448.83 ml  Output -  Net 1448.83 ml   Filed Weights   10/20/18 0500 10/21/18 0500 10/22/18 0600  Weight: 134.5 kg (!) 137.5 kg 134.3 kg   Acutely ill-appearing, obese woman, awake Follows commands, moves all 4 extremities Bandage on left neck, right IJ dialysis catheter, no bleeding S1-S2 distant Decreased breath  sounds bilateral 1+ edema Soft and obese abdomen  Chest x-ray 11/1 personally reviewed, bilateral infiltrates?  Airspace disease versus edema  Assessment & Plan:   Acute Hypercarbic Respiratory Failure suspected secondary to decompensated Heart Failure   H/O COPD, OSA on CPAP at HS  Recurrent respiratory arrest secondary to blood clots /mucous plugs -Continue spontaneous breathing trials   V.Fib/PEA Cardiac Arrest - elevated troponin as expected. No evidence of DVT.  Decompensated Heart Failure  H/O Diastolic HF (EF 52-77,  O2UM) Recurrent PEA/cardiac arrest x2 - cath Negative Hypotension post PEA arrest - Off levophed   Acute on Chronic Stage IV Kidney Disease  Baseline Crt high 3s. . Recent placement of UE Fistula . -  Discussed, resume CRRT per renal - Will need a perm cath if WBC decreases vs replace temp cath  Fever ? HCAP , Serratia UTI - WBC count rose to 22 and now decreasing again - on  cefepime   Hypoglycemia -resolved H/O DM, now uncontrolled CBG (last 3)  Recent Labs    10/21/18 2358 10/22/18 0358 10/22/18 0804  GLUCAP 175* 161* 178*   - Off insulin drip, increased Lantus to 30 units daily -on 60 units at home - SSI resistant scale - add TF coverage  Acute Encephalopathy  - Arousable and following command   Disposition / Summary of Today's Plan 10/22/18   BUN is rising and she will need resumption of dialysis. Cause of her recurrent arrest remains unclear, cath was nondiagnostic, mucous plugs were not impressive. Hopeful to liberate her from the vent eventually     Code Status: FC Family Communication: Daughters updated daily  The patient is critically ill with multiple organ systems failure and requires high complexity decision making for assessment and support, frequent evaluation and titration of therapies, application of advanced monitoring technologies and extensive interpretation of multiple databases. Critical Care Time devoted to patient care services described in this note independent of APP/resident  time is 31 minutes.   Kara Mead MD. Shade Flood. [place] [facility] Pager [phone] If no response call [phone]   10/22/2018

## 2018-10-22 NOTE — Progress Notes (Signed)
Set respiratory rate decreased from 24 to 20 by MD at 10:00AM.  Tolerating well.

## 2018-10-22 NOTE — Consult Note (Signed)
[place] Nurse wound consult note Patient evaluated in [place].  No family present. Reason for Consult: Sternal wound Wound type: Presumably from sternal defibrillator pads per her primary RN Measurement: 4 cm x 7 cm Wound bed: Some dried yellow discoloration with a darkened border in the triangularly shaped wound at the base of the sternum and between her breasts. Drainage (amount, consistency, odor) None on existing foam dressing Periwound: Intact, normal color and texture Dressing procedure/placement/frequency: Vaseline gauze covered with dry gauze, change daily. Additional care items include a SizeWise bariatric bed with air mattress and linen instructions. Monitor the wound area(s) for worsening of condition such as: Signs/symptoms of infection,  Increase in size,  Development of or worsening of odor, Development of pain, or increased pain at the affected locations.  Notify the medical team if any of these develop.  Thank you for the consult.  Discussed plan of care with the patient and bedside nurse.  [place] nurse will not follow at this time.  Please re-consult the [place] team if needed.  Val Riles, RN, MSN, CWOCN, CNS-BC, pager [phone]

## 2018-10-22 NOTE — Progress Notes (Addendum)
Pharmacy Antibiotic Note  Catherine Williams is a 57 y.o. female admitted on 10/07/2018 with witnessed arrest. Pharmacy consulted for cefepime dosing for possible sepsis. Pt previously on CVVHD, transitioned to iHD and did not tolerate. Per nephrology, no RRT today, will likely start CVVHD tomorrow. SCr rising and pt anuric.   Plan: -Adjust cefepime to 500mg  IV q24h -Monitor plans to initiate RRT  ADDENDUM: Initiating CVVHD this morning, will adjust cefepime to 2g IV q12h starting tonight.  Height: 5\' 2"  (157.5 cm) Weight: 296 lb 1.2 oz (134.3 kg) IBW/kg (Calculated) : 50.1  Temp (24hrs), Avg:99.2 F (37.3 C), Min:98.7 F (37.1 C), Max:99.6 F (37.6 C)  Recent Labs  Lab 10/17/18 0728  10/18/18 0258  10/19/18 0222 10/19/18 1555 10/20/18 0433 10/20/18 1604 10/21/18 0450 10/21/18 1542 10/22/18 0406  WBC  --   --  22.2*  --  21.2* 23.7* 22.9*  --  18.5*  --   --   CREATININE 2.69*   < > 4.24*   < > 5.59* 5.73* 6.52* 7.19* 7.64* 7.93* 8.40*  LATICACIDVEN 1.2  --   --   --   --   --   --   --   --   --   --    < > = values in this interval not displayed.    Estimated Creatinine Clearance: 9.5 mL/min (A) (by C-G formula based on SCr of 8.4 mg/dL (H)).    No Known Allergies  Antimicrobials this admission: 10/28 cefepime>>10/31, 11/3>> 10/31 CTX>> 11/1   Microbiology results: 10/20 Blood cx: NGTD 10/20 Sputum cx: normal resp flora 10/25 sputum -normal flora 10/28 urine - Serratia  Thank you for allowing pharmacy to be a part of this patient's care.  Arrie Senate, PharmD, BCPS Clinical Pharmacist [phone] Please check AMION for all [place] numbers 10/22/2018

## 2018-10-23 ENCOUNTER — Inpatient Hospital Stay (HOSPITAL_COMMUNITY): Payer: Medicare Other

## 2018-10-23 LAB — CBC
HEMATOCRIT: 25.5 % — AB (ref 36.0–46.0)
Hemoglobin: 7.1 g/dL — ABNORMAL LOW (ref 12.0–15.0)
MCH: 24.8 pg — ABNORMAL LOW (ref 26.0–34.0)
MCHC: 27.8 g/dL — ABNORMAL LOW (ref 30.0–36.0)
MCV: 89.2 fL (ref 80.0–100.0)
Platelets: 313 10*3/uL (ref 150–400)
RBC: 2.86 MIL/uL — ABNORMAL LOW (ref 3.87–5.11)
RDW: 15.8 % — AB (ref 11.5–15.5)
WBC: 15.6 10*3/uL — ABNORMAL HIGH (ref 4.0–10.5)
nRBC: 0.3 % — ABNORMAL HIGH (ref 0.0–0.2)

## 2018-10-23 LAB — RENAL FUNCTION PANEL
ALBUMIN: 1.9 g/dL — AB (ref 3.5–5.0)
ANION GAP: 10 (ref 5–15)
Albumin: 2 g/dL — ABNORMAL LOW (ref 3.5–5.0)
Anion gap: 10 (ref 5–15)
BUN: 97 mg/dL — ABNORMAL HIGH (ref 6–20)
BUN: 65 mg/dL — AB (ref 6–20)
CHLORIDE: 105 mmol/L (ref 98–111)
CHLORIDE: 104 mmol/L (ref 98–111)
CO2: 19 mmol/L — AB (ref 22–32)
CO2: 22 mmol/L (ref 22–32)
Calcium: 7.4 mg/dL — ABNORMAL LOW (ref 8.9–10.3)
Calcium: 8.4 mg/dL — ABNORMAL LOW (ref 8.9–10.3)
Creatinine, Ser: 5.1 mg/dL — ABNORMAL HIGH (ref 0.44–1.00)
Creatinine, Ser: 3.61 mg/dL — ABNORMAL HIGH (ref 0.44–1.00)
GFR calc Af Amer: 10 mL/min — ABNORMAL LOW (ref >60)
GFR calc Af Amer: 15 mL/min — ABNORMAL LOW (ref >60)
GFR calc non Af Amer: 8 mL/min — ABNORMAL LOW (ref >60)
GFR, EST NON AFRICAN AMERICAN: 13 mL/min — AB (ref >60)
Glucose, Bld: 182 mg/dL — ABNORMAL HIGH (ref 70–99)
Glucose, Bld: 159 mg/dL — ABNORMAL HIGH (ref 70–99)
POTASSIUM: 4.8 mmol/L (ref 3.5–5.1)
POTASSIUM: 4.6 mmol/L (ref 3.5–5.1)
Phosphorus: 3.5 mg/dL (ref 2.5–4.6)
Phosphorus: 3.2 mg/dL (ref 2.5–4.6)
Sodium: 134 mmol/L — ABNORMAL LOW (ref 135–145)
Sodium: 136 mmol/L (ref 135–145)

## 2018-10-23 LAB — POCT ACTIVATED CLOTTING TIME
ACTIVATED CLOTTING TIME: 164 s
ACTIVATED CLOTTING TIME: 169 s
ACTIVATED CLOTTING TIME: 169 s
ACTIVATED CLOTTING TIME: 175 s
ACTIVATED CLOTTING TIME: 175 s
ACTIVATED CLOTTING TIME: 175 s
ACTIVATED CLOTTING TIME: 180 s
ACTIVATED CLOTTING TIME: 197 s
Activated Clotting Time: 158 seconds
Activated Clotting Time: 175 seconds
Activated Clotting Time: 169 seconds
Activated Clotting Time: 175 seconds
Activated Clotting Time: 186 seconds
Activated Clotting Time: 180 seconds
Activated Clotting Time: 186 seconds
Activated Clotting Time: 186 seconds
Activated Clotting Time: 175 seconds
Activated Clotting Time: 180 seconds
Activated Clotting Time: 202 seconds

## 2018-10-23 LAB — GLUCOSE, CAPILLARY
GLUCOSE-CAPILLARY: 123 mg/dL — AB (ref 70–99)
GLUCOSE-CAPILLARY: 152 mg/dL — AB (ref 70–99)
GLUCOSE-CAPILLARY: 158 mg/dL — AB (ref 70–99)
GLUCOSE-CAPILLARY: 161 mg/dL — AB (ref 70–99)
GLUCOSE-CAPILLARY: 188 mg/dL — AB (ref 70–99)
Glucose-Capillary: 168 mg/dL — ABNORMAL HIGH (ref 70–99)

## 2018-10-23 LAB — APTT: aPTT: 58 seconds — ABNORMAL HIGH (ref 24–36)

## 2018-10-23 LAB — MAGNESIUM: MAGNESIUM: 2.6 mg/dL — AB (ref 1.7–2.4)

## 2018-10-23 MED ORDER — PRISMASOL BGK 4/2.5 32-4-2.5 MEQ/L IV SOLN
INTRAVENOUS | Status: DC
  Administered 2018-10-23 – 2018-10-24 (×9): via INTRAVENOUS_CENTRAL
  Filled 2018-10-23 (×13): qty 5000

## 2018-10-23 MED ORDER — PRISMASOL BGK 4/2.5 32-4-2.5 MEQ/L REPLACEMENT SOLN
Status: DC
  Administered 2018-10-23 (×2): via INTRAVENOUS_CENTRAL
  Filled 2018-10-23 (×2): qty 5000

## 2018-10-23 MED ORDER — SODIUM CHLORIDE 0.9 % IV SOLN
INTRAVENOUS | Status: DC | PRN
  Administered 2018-10-23: 250 mL via INTRAVENOUS

## 2018-10-23 MED ORDER — PRISMASOL BGK 4/2.5 32-4-2.5 MEQ/L REPLACEMENT SOLN
Status: DC
  Administered 2018-10-23 (×2): via INTRAVENOUS_CENTRAL
  Filled 2018-10-23 (×4): qty 5000

## 2018-10-23 MED ORDER — HEPARIN SODIUM (PORCINE) 1000 UNIT/ML DIALYSIS: 1000.0000 [IU] | INTRAMUSCULAR | Status: DC | PRN

## 2018-10-23 NOTE — Progress Notes (Signed)
[facility] NEPHROLOGY PROGRESS NOTE  Assessment/ Plan: Pt is a 57 y.o. yo female with advanced CKD status post AV fistula creation, admitted with cardiac arrest and acute on chronic renal failure.  #Acute on advance CKD after cardiac arrest: Patient is on low-dose levo and trying to wean today.  She remains anuric.  She required CRRT from 10/25-10/30 (AM).  Patient did not tolerate intermittent hemodialysis on 11/1, only 15 minutes and went into PEA arrest x2.  Patient underwent emergent cardiac cath with no PCI. -Restarted CRRT on 10/22/2018. Changed to 4K, 2.5 ca.  Patient is tolerating well, plan for 50 cc an hour of ultrafiltration.  BUN is improving.  IR was consulted for placement of permanent tunneled catheter.  Leukocytosis improving.  Patient will be dialysis dependent.  Continue CRRT today. -She has left AV fistula maturing.  #Hypotension: She is off Levophed this morning.  Blood pressure borderline.  Continue to monitor.  #Hyperkalemia: Serum potassium level improved to 4.8 today.  Received a dose of patiromer yesterday.  #Anemia, multifactorial: Iron saturation 38%, hemoglobin 7.4.Received aranesp on 11/4.  #Cardiac arrest status post cardiac cath, mid RCA 55% not enough to explain PEA arrest per cardiology.  Currently intubated.   Subjective: Seen and examined at bedside.  Trach and on pain.  Tolerating CRRT well.  Review of systems limited.  Objective Vital signs in last 24 hours: Vitals:   10/23/18 0630 10/23/18 0700 10/23/18 0759 10/23/18 0800  BP:   (!) 149/81   Pulse: 68 68 71   Resp: (!) 31 (!) 23 (!) 35   Temp:    99.4 F (37.4 C)  TempSrc:    Oral  SpO2: 100% 100% 100%   Weight:      Height:       Weight change: -2.303 kg  Intake/Output Summary (Last 24 hours) at 10/23/2018 0842 Last data filed at 10/23/2018 0800 Gross per 24 hour  Intake 1747.39 ml  Output 1834 ml  Net -86.61 ml       Labs: Basic Metabolic Panel: Recent Labs  Lab  10/22/18 0406 10/22/18 1702 10/23/18 0413  NA 135 135 134*  K 5.0 4.9 4.8  CL 98 107 105  CO2 21* 19* 19*  GLUCOSE 186* 196* 182*  BUN 157* 149* 97*  CREATININE 8.40* 7.91* 5.10*  CALCIUM 10.1 9.6 7.4*  PHOS 5.6* 5.8* 3.5   Liver Function Tests: Recent Labs  Lab 10/19/18 1555  10/22/18 0406 10/22/18 1702 10/23/18 0413  AST 72*  --   --   --   --   ALT 70*  --   --   --   --   ALKPHOS 200*  --   --   --   --   BILITOT 0.5  --   --   --   --   PROT 7.2  --   --   --   --   ALBUMIN 2.0*   < > 1.9* 1.9* 1.9*   < > = values in this interval not displayed.   No results for input(s): LIPASE, AMYLASE in the last 168 hours. No results for input(s): AMMONIA in the last 168 hours. CBC: Recent Labs  Lab 10/17/18 0414 10/18/18 0258 10/19/18 0222 10/19/18 1555 10/20/18 0433 10/21/18 0450 10/23/18 0413  WBC 19.5* 22.2* 21.2* 23.7* 22.9* 18.5* 15.6*  NEUTROABS 14.3* 15.5*  --   --   --   --   --   HGB 9.4* 8.8* 8.4* 8.7* 8.1* 7.4*  7.1*  HCT 35.2* 31.5* 29.6* 32.0* 28.9* 26.4* 25.5*  MCV 92.4 91.3 89.4 92.2 88.9 88.0 89.2  PLT 214 196 203 228 251 271 313   Cardiac Enzymes: Recent Labs  Lab 10/17/18 0728  TROPONINI 0.03*   CBG: Recent Labs  Lab 10/22/18 1427 10/22/18 1626 10/22/18 1942 10/23/18 0005 10/23/18 0409  GLUCAP 198* 207* 161* 158* 188*    Iron Studies: No results for input(s): IRON, TIBC, TRANSFERRIN, FERRITIN in the last 72 hours. Studies/Results: Dg Chest Port 1 View  Result Date: 10/23/2018 CLINICAL DATA:  Acute respiratory failure. EXAM: PORTABLE CHEST 1 VIEW COMPARISON:  Radiograph of October 19, 2018. FINDINGS: Stable cardiomegaly. Tracheostomy and nasogastric tubes are unchanged in position. Stable right internal jugular catheter is noted. Stable central pulmonary vascular congestion and possible pulmonary edema is noted. No pneumothorax or pleural effusion is noted. Bony thorax is unremarkable. IMPRESSION: Stable support apparatus. Stable  cardiomegaly with central pulmonary vascular congestion and possible pulmonary edema. Electronically Signed   By: Marijo Conception, M.D.   On: 10/23/2018 07:31    Medications: Infusions: . ceFEPime (MAXIPIME) IV 2 g (10/22/18 1729)  . dexmedetomidine (PRECEDEX) IV infusion 0.4 mcg/kg/hr (10/23/18 0800)  . feeding supplement (VITAL HIGH PROTEIN) 1,000 mL (10/22/18 1657)  . heparin 10,000 units/ 20 mL infusion syringe 2,150 Units/hr (10/23/18 0808)  . heparin 999 mL/hr at 10/22/18 1601  . norepinephrine (LEVOPHED) Adult infusion 2 mcg/min (10/23/18 0800)  . prismasol B22GK 4/0 300 mL/hr at 10/22/18 1626  . prismasol B22GK 4/0 500 mL/hr at 10/23/18 0256  . prismasol B22GK 4/0 2,000 mL/hr at 10/23/18 0805    Scheduled Medications: . aspirin  81 mg Oral Daily  . chlorhexidine gluconate (MEDLINE KIT)  15 mL Mouth Rinse BID  . Chlorhexidine Gluconate Cloth  6 each Topical Daily  . heparin injection (subcutaneous)  5,000 Units Subcutaneous Q8H  . insulin aspart  0-20 Units Subcutaneous Q4H  . insulin aspart  6 Units Subcutaneous Q6H  . insulin glargine  30 Units Subcutaneous Daily  . ipratropium-albuterol  3 mL Nebulization Q6H  . mouth rinse  15 mL Mouth Rinse 10 times per day  . multivitamin  15 mL Per Tube Daily  . pantoprazole sodium  40 mg Per Tube Daily  . rosuvastatin  10 mg Per Tube QPM    have reviewed scheduled and prn medications.  Physical Exam: General: Trach and on vent, not really following commands Heart:RRR, s1s2 nl Lungs: Coarse breath sound bilateral, no wheezing Abdomen:soft, Non-tender, non-distended Extremities:No edema Dialysis Access: Temporary dialysis catheter.  Catherine Williams 10/23/2018,8:42 AM  LOS: 16 days

## 2018-10-23 NOTE — Progress Notes (Signed)
Patient ID: Catherine Williams, female   DOB: 03-19-1961, 57 y.o.   MRN: [id number]   Spoke to daughter Catherine Williams via phone Consent signed and in chart  Risks and benefits discussed with the patient's daughter including, but not limited to bleeding, infection, vascular injury, pneumothorax which may require chest tube placement, air embolism or even death All of the her questions were answered, she is agreeable to proceed. Consent signed and in IR.

## 2018-10-23 NOTE — Progress Notes (Addendum)
Nutrition Follow Up  DOCUMENTATION CODES:   Morbid obesity  INTERVENTION:    Vital HP at goal rate of 40 ml/h (960 ml per day) and Prostat 30 ml TID  Provides 1260 kcals, 129 gm protein, 802 ml free water daily   Liquid MVI daily   NUTRITION DIAGNOSIS:   Inadequate oral intake related to inability to eat as evidenced by NPO status, ongoing  GOAL:   Provide needs based on ASPEN/SCCM guidelines, met  MONITOR:   TF tolerance, Vent status, Labs, Skin, Weight trends, I & O's  ASSESSMENT:   57 yo [place] who presented to ED s/p witnessed arrest. Upon EMS's arrival patient was noted to be pulseless. ROSC achieved at 1900 after 2 shocks and CPR. On arrival to ED patient remained unresponsive.  10/25--10/30 CVVHD   Patient is currently on ventilator support Temp (24hrs), Avg:98.1 F (36.7 C), Min:97.5 F (36.4 C), Max:99.4 F (37.4 C)  Vital High Protein infusing at goal rate of 40 ml/hr via large bore NGT. Advanced CKD. Did not tolerate iHD >> went into PEA arrest x 2. She is s/p perc trach placement 11/1.  Coronary angiography showed minor CAD. Labs & medications reviewed. Na 134 (L). CBG's [phone].  CVVHD restarted today. Plan for perm HD catheter per IR.  Diet Order:   Diet Order            Diet NPO time specified Except for: Sips with Meds  Diet effective midnight             EDUCATION NEEDS:   No education needs have been identified at this time  Skin:  Skin Assessment: Skin Integrity Issues: Skin Integrity Issues:: Other (Comment) Stage II: R arm & neck Other: MDRPI sternal wound (from defibrillator pads)  Last BM:  11/4   Intake/Output Summary (Last 24 hours) at 10/23/2018 1324 Last data filed at 10/23/2018 1300 Gross per 24 hour  Intake 1943.51 ml  Output 2497 ml  Net -553.49 ml   Height:   Ht Readings from Last 1 Encounters:  10/07/18 _0  (1.575 m)   Weight:   Wt Readings from Last 1 Encounters:  10/23/18 132 kg   Ideal Body  Weight:  50 kg  BMI:  Body mass index is 53.22 kg/m.  Estimated Nutritional Needs:   Kcal:  1100-1250  Protein:  >/= 125 gm  Fluid:  per MD  Arthur Holms, RD, LDN Pager #: [phone] After-Hours Pager #: [phone]

## 2018-10-23 NOTE — Plan of Care (Signed)
  Problem: Respiratory: Goal: Ability to maintain a clear airway and adequate ventilation will improve Outcome: Not Progressing Note:  Patient not tolerating weaning on vent due to increased respiratory rate.

## 2018-10-23 NOTE — Progress Notes (Signed)
57 year old obese woman with OSA and CKD stage V admitted 10/20 after PEA cardiac arrest. Underwent hypothermia protocol and started on CRRT  10/26  sustained another brief PEA arrest  related to mucous plugging, underwent bronchoscopy She had 2 more arrest on 10/30 again attributed to mucous plugging and on 11/1 post tracheostomy placement, cardiac cath was negative for any obstructive CAD.  EP was consulted She was back on levo fed last 3 days and this is being titrated off, CRRT was resumed 11/4 and she seems to have tolerated this well with removal of 50 cc an hour. She is lethargic and sleepy this morning but was following commands yesterday and per RN Obese woman, decreased breath sounds bilateral, no bleeding around trach site, warm extremities.  Labs show decreasing leukocytosis, mild hyponatremia and decreasing BUN with dialysis, mild anemia with an albumin 7.1.  Chest x-ray personally reviewed shows bilateral congestive pattern.  Impression/plan Recurrent cardiac arrest-her primary event seems to have been cardiac as well as the event post tracheostomy.  However no cause has been defined.  She has been seen by cardiology as well as by EP service.  I had extensive discussion with the daughter trying to explain the situation.  She had mild metabolic and respiratory acidosis during these events.  Acute respiratory failure/tracheostomy-hope to make some progress with spontaneous breathing trials as we remove volume with goal of trach collar eventually  AKI on CKD 5-she will likely be dialysis dependent, noted leukocytosis is decreased, can proceed with placement of permacath.  Serratia UTI-plan for total 10 days of antibiotics until 11/6.  Acute encephalopathy-appears to be neurologically intact postarrest, initiate PT but limited by CRRT   Social work to start looking at disposition  The patient is critically ill with multiple organ systems failure and requires high complexity  decision making for assessment and support, frequent evaluation and titration of therapies, application of advanced monitoring technologies and extensive interpretation of multiple databases. Critical Care Time devoted to patient care services described in this note independent of APP/resident  time is 31 minutes.   Kara Mead MD. Shade Flood. [place] [facility] Pager [phone] If no response call [phone]   10/23/2018

## 2018-10-23 NOTE — Progress Notes (Signed)
Patient's ACTs subtherapeutic and CRRT machine max limit for heparin syringe is 5 mL/hour (2500 units/hr) so unable to continue increasing rate.  Spoke with MD Justin Mend, patient hopefully due to obtain tunneled HD catheter 11/6 but may be 11/7 so MD advised to continue with current treatment with no additional heparin orders.  MD Justin Mend advised if CRRT filter clots, do not restart CRRT until discussion with treatment team on 11/6 to determine plan for tunneled HD catheter to determine plan for heparin.

## 2018-10-23 NOTE — Consult Note (Signed)
Chief Complaint: Patient was seen in consultation today for conversion of right IJ temp dialysis catheter for tunneled dialysis catheter placement Chief Complaint  Patient presents with  . Post arrest   at the request of Dr Katheran James  Supervising Physician: Corrie Mckusick  [facility]  History of Present Illness: Catherine Williams is a 57 y.o. female   Admitted after sudden cardiac arrest- long ROSC Acute renal failure CRRT started 10/25; temp cath placed by CCM  No evidence of recovery  Was scheduled for conversion of temp to tunneled dialysis catheter 10/31 Developed fever and leukocytosis Now afeb and wbc trending down Dialysis dependent Left AV fistula maturing  Rescheduled to 11/6 New consent for procedure Labs checked   Past Medical History:  Diagnosis Date  . Anemia   . Arthritis Dec. 2014   Gout  . Asthma   . CHF (congestive heart failure) ([place])   . COPD (chronic obstructive pulmonary disease) ([place])   . Diabetes mellitus   . GERD (gastroesophageal reflux disease)   . Heart failure   . Hyperlipidemia   . Hypertension   . Kidney disease   . Morbid obesity ([place])   . Peripheral vascular disease ([place])   . Pinched nerve   . Requires supplemental oxygen    as needed  . Sleep apnea    wears cpap  . Wears dentures     Past Surgical History:  Procedure Laterality Date  . Atherectomy and angioplasty  10/18/2011   left posterior tibial artery  . AV FISTULA PLACEMENT Left 10/04/2018   Procedure: ARTERIOVENOUS (AV) FISTULA CREATION LEFT ARM;  Surgeon: Serafina Mitchell, MD;  Location: [facility];  Service: Vascular;  Laterality: Left;  . COLONOSCOPY    . HAND SURGERY     right  . LEFT HEART CATH AND CORONARY ANGIOGRAPHY N/A 10/19/2018   Procedure: LEFT HEART CATH AND CORONARY ANGIOGRAPHY;  Surgeon: Leonie Man, MD;  Location: [facility];  Service: Cardiovascular;  Laterality: N/A;  . MULTIPLE TOOTH EXTRACTIONS    . OVARY SURGERY       Left  . TOE AMPUTATION  Sept. 25,2012   Left 4th and 5th toes    Allergies: Patient has no known allergies.  Medications: Prior to Admission medications   Medication Sig Start Date End Date Taking? Authorizing Provider  amLODipine (NORVASC) 5 MG tablet Take 1 tablet (5 mg total) by mouth daily. 05/19/18  Yes Roxan Hockey, MD  carvedilol (COREG) 6.25 MG tablet Take 1 tablet (6.25 mg total) by mouth 2 (two) times daily with a meal. 05/18/18  Yes Emokpae, Courage, MD  furosemide (LASIX) 80 MG tablet Take 1 tablet (80 mg)  in the morning, and 1/2 tablet (40 mg)  in the evening Patient taking differently: Take 40-80 mg by mouth See admin instructions. Take 1 tablet (80 mg)  in the morning, and 1/2 tablet (40 mg)  in the evening 05/18/18  Yes Emokpae, Courage, MD  insulin aspart (NOVOLOG) 100 UNIT/ML injection Inject 20 Units into the skin 3 (three) times daily with meals.    Yes [provider]  insulin glargine (LANTUS) 100 UNIT/ML injection Inject 60 Units into the skin at bedtime.    Yes [provider]  isosorbide-hydrALAZINE (BIDIL) 20-37.5 MG tablet Take 2 tablets by mouth 3 (three) times daily. Patient taking differently: Take 1 tablet by mouth 3 (three) times daily.  05/18/18  Yes Emokpae, Courage, MD  rosuvastatin (CRESTOR) 40 MG tablet  Take 40 mg by mouth every evening.    Yes [provider]  albuterol (PROVENTIL HFA;VENTOLIN HFA) 108 (90 BASE) MCG/ACT inhaler Inhale 1-2 puffs into the lungs every 6 (six) hours as needed for wheezing. 07/25/13   Harden Mo, MD  allopurinol (ZYLOPRIM) 100 MG tablet Take 1 tablet (100 mg total) by mouth at bedtime. 07/16/18   Jessy Oto, MD  aspirin 81 MG tablet Take 81 mg by mouth daily.      [provider]  Darbepoetin Alfa (ARANESP) 60 MCG/0.3ML SOSY injection Inject 0.3 mLs (60 mcg total) into the skin every Thursday at 6pm. Patient taking differently: Inject 60 mcg into the skin every 30 (thirty) days.   11/03/16   Doreatha Lew, MD  ferrous sulfate 325 (65 FE) MG tablet Take 1 tablet (325 mg total) by mouth 2 (two) times daily with a meal. 11/03/16   Patrecia Pour, Christean Grief, MD  HYDROcodone-acetaminophen (NORCO/VICODIN) 5-325 MG tablet Take 1 tablet by mouth every 6 (six) hours as needed. Patient taking differently: Take 1 tablet by mouth every 6 (six) hours as needed for moderate pain.  07/03/18   Loura Halt A, NP  loratadine (CLARITIN) 10 MG tablet Take 10 mg by mouth daily as needed for allergies.     [provider]  oxyCODONE (ROXICODONE) 5 MG immediate release tablet Take 1 tablet (5 mg total) by mouth every 8 (eight) hours as needed. 10/04/18 10/04/19  Serafina Mitchell, MD  pantoprazole (PROTONIX) 40 MG tablet Take 1 tablet (40 mg total) by mouth daily. 07/21/15   Barton Dubois, MD     Family History  Problem Relation Age of Onset  . Lung cancer Mother   . Clotting disorder Mother   . Heart disease Mother   . Cancer Mother        Lung  . Deep vein thrombosis Mother   . Diabetes Mother   . Hypertension Mother   . Varicose Veins Mother   . Cancer Father   . Clotting disorder Sister   . Heart attack Sister   . Diabetes Sister   . Clotting disorder Brother   . Deep vein thrombosis Brother   . Diabetes Brother   . Heart disease Brother   . Heart disease Sister     Social History   Socioeconomic History  . Marital status: Single    Spouse name: Not on file  . Number of children: Y  . Years of education: Not on file  . Highest education level: Not on file  Occupational History  . 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  Social Needs  . Financial resource strain: Not on file  . Food insecurity:    Worry: Not on file    Inability: Not on file  . Transportation needs:    Medical: Not on file    Non-medical: Not on file  Tobacco Use  . Smoking status: Never Smoker  . Smokeless tobacco: Never Used  Substance and  Sexual Activity  . Alcohol use: No    Alcohol/week: 0.0 standard drinks  . Drug use: Not Currently    Types: Marijuana    Comment: FORMER>>smoked weed from age 89 to 38>> quit at age 66   . Sexual activity: Not on file  Lifestyle  . Physical activity:    Days per week: Not on file    Minutes per session: Not on file  . Stress: Not on  file  Relationships  . Social connections:    Talks on phone: Not on file    Gets together: Not on file    Attends religious service: Not on file    Active member of club or organization: Not on file    Attends meetings of clubs or organizations: Not on file    Relationship status: Not on file  Other Topics Concern  . Not on file  Social History Narrative   Lives with daughter           Review of Systems: A 12 point ROS discussed and pertinent positives are indicated in the HPI above.  All other systems are negative.  Review of Systems  Constitutional: Positive for activity change and appetite change. Negative for fever.  Respiratory:       Vent    Vital Signs: BP (!) 149/81   Pulse 80   Temp 99.4 F (37.4 C) (Oral)   Resp (!) 23   Ht 5\' 2"  (1.575 m)   Wt 291 lb (132 kg)   SpO2 100%   BMI 53.22 kg/m   Physical Exam  Cardiovascular: Regular rhythm.  Abdominal: Soft.  Neurological:  More alert Trying to communicate  Skin: Skin is warm and dry.  Psychiatric:  Will gain consent from family  Vitals reviewed.   Imaging: Ct Head Wo Contrast  Result Date: 10/07/2018 CLINICAL DATA:  Altered mental status. EXAM: CT HEAD WITHOUT CONTRAST TECHNIQUE: Contiguous axial images were obtained from the base of the skull through the vertex without intravenous contrast. COMPARISON:  07/02/2015 FINDINGS: Brain: No acute intracranial abnormality. Specifically, no hemorrhage, hydrocephalus, mass lesion, acute infarction, or significant intracranial injury. Vascular: No hyperdense vessel or unexpected calcification. Skull: No acute calvarial  abnormality. Sinuses/Orbits: No acute finding Other: None IMPRESSION: No acute intracranial abnormality. Electronically Signed   By: Rolm Baptise M.D.   On: 10/07/2018 22:24   Dg Chest Port 1 View  Result Date: 10/23/2018 CLINICAL DATA:  Acute respiratory failure. EXAM: PORTABLE CHEST 1 VIEW COMPARISON:  Radiograph of October 19, 2018. FINDINGS: Stable cardiomegaly. Tracheostomy and nasogastric tubes are unchanged in position. Stable right internal jugular catheter is noted. Stable central pulmonary vascular congestion and possible pulmonary edema is noted. No pneumothorax or pleural effusion is noted. Bony thorax is unremarkable. IMPRESSION: Stable support apparatus. Stable cardiomegaly with central pulmonary vascular congestion and possible pulmonary edema. Electronically Signed   By: Marijo Conception, M.D.   On: 10/23/2018 07:31   Dg Chest Port 1 View  Result Date: 10/19/2018 CLINICAL DATA:  Cardiac arrest. EXAM: PORTABLE CHEST 1 VIEW COMPARISON:  Chest x-ray from same day at 14:56. FINDINGS: Unchanged tracheostomy tube, enteric tube, and right internal jugular catheter. Stable cardiomegaly and central pulmonary vascular congestion. New hazy opacities throughout both lungs. No pleural effusion or pneumothorax. No acute osseous abnormality. IMPRESSION: 1. New hazy opacities throughout both lungs, suspicious for pulmonary edema. Electronically Signed   By: Titus Dubin M.D.   On: 10/19/2018 16:20   Dg Chest Port 1 View  Result Date: 10/19/2018 CLINICAL DATA:  Post tracheostomy. EXAM: PORTABLE CHEST 1 VIEW COMPARISON:  None. FINDINGS: New tracheostomy tube projects over the upper trachea. No evidence of pneumomediastinum. No pneumothorax. Right IJ catheter tip terminates in the distal SVC. The left IJ approach central line has been removed. Gastric tube extends below the left hemidiaphragm. Lung volumes remain low. There is stable cardiomegaly with central vascular congestion and left basilar  atelectasis. IMPRESSION: Low lung volumes with stable  cardiomegaly and central vascular congestion. Support lines and tubes as above in satisfactory position. No pneumothorax or pneumomediastinum. Electronically Signed   By: Ashley Royalty M.D.   On: 10/19/2018 15:13   Dg Chest Port 1 View  Result Date: 10/18/2018 CLINICAL DATA:  Respiratory distress EXAM: PORTABLE CHEST 1 VIEW COMPARISON:  10/17/2018 FINDINGS: Endotracheal tube, nasogastric catheter and right and left jugular lines are again identified and stable. Increased vascular congestion is noted when compare with the prior exam. Mild left basilar atelectasis is noted increased from the prior study. No pneumothorax is seen. No bony abnormality is noted. IMPRESSION: Tubes and lines stable from the prior exam. No pneumothorax is noted. Increasing left basilar atelectasis. Increased vascular congestion is noted. Electronically Signed   By: Inez Catalina M.D.   On: 10/18/2018 08:42   Dg Chest Port 1 View  Result Date: 10/17/2018 CLINICAL DATA:  Hypoxia EXAM: PORTABLE CHEST 1 VIEW COMPARISON:  October 16, 2018 FINDINGS: Endotracheal tube tip is 4.3 cm above the carina. Nasogastric tube tip and side port below the diaphragm. Right jugular catheter tip is in the superior vena cava. Left jugular catheter tip is at the junction of the left innominate vein and superior vena cava. No pneumothorax. There is mild bibasilar atelectasis. Lungs elsewhere are clear. The heart is upper normal in size with pulmonary vascularity normal. No adenopathy. No bone lesions. IMPRESSION: Tube and catheter positions as described without pneumothorax. Mild bibasilar atelectasis. No consolidation. Stable cardiac silhouette. Electronically Signed   By: Lowella Grip III M.D.   On: 10/17/2018 07:10   Dg Chest Port 1 View  Result Date: 10/16/2018 CLINICAL DATA:  Acute respiratory failure EXAM: PORTABLE CHEST 1 VIEW COMPARISON:  10/13/2018 FINDINGS: Endotracheal tube in good  position and unchanged. Right jugular central venous catheter tip in the SVC. Left jugular central venous catheter tip in the SVC unchanged. NG tube enters the stomach with the tip not visualized. Mild bibasilar airspace disease has improved in the interval. Negative for edema or effusion. IMPRESSION: Support lines remain in good position. Bibasilar airspace disease with interval improvement. Electronically Signed   By: Franchot Gallo M.D.   On: 10/16/2018 09:40   Dg Chest Port 1 View  Result Date: 10/13/2018 CLINICAL DATA:  Hypoxia EXAM: PORTABLE CHEST 1 VIEW COMPARISON:  October 13, 2018 FINDINGS: Endotracheal tube tip is 3.7 cm above the carina. Nasogastric tube tip and side port are below the diaphragm. Right jugular catheter tip is in the superior vena cava. Left jugular catheter tip is in the left innominate vein near the junction with the superior vena cava. No pneumothorax. There is atelectatic change in the right mid lung. There is a small focus of airspace consolidation in the medial right base. Lungs elsewhere are clear. There is cardiomegaly with pulmonary vascularity normal. No adenopathy. No bone lesions. IMPRESSION: Tube and catheter positions as described without pneumothorax. Small focus of consolidation medial right base, felt to represent a focal area of pneumonia. Atelectasis right mid lung. Left lung clear. Heart mildly enlarged. Electronically Signed   By: Lowella Grip III M.D.   On: 10/13/2018 23:19   Dg Chest Port 1 View  Result Date: 10/13/2018 CLINICAL DATA:  57 year old female with respiratory failure status post cardiac arrest. EXAM: PORTABLE CHEST 1 VIEW COMPARISON:  10/12/2018 and earlier. FINDINGS: Portable AP semi upright view at 0504 hours. Stable endotracheal tube tip just below the clavicles. Enteric tube courses to the left abdomen, tip not included. Bilateral IJ approach central lines  remain in place. Pacer or resuscitation pads project over the left chest. Mildly  lower lung volumes and more kyphotic view. Stable cardiac size and mediastinal contours. Coarse and indistinct bilateral pulmonary interstitial opacity persists. No pneumothorax or definite pleural effusion. Paucity of bowel gas in the upper abdomen. IMPRESSION: 1.  Stable lines and tubes. 2. Persistent coarse bilateral pulmonary interstitial opacity. Top differential considerations include interstitial edema, atypical respiratory infection, ARDS. No pleural effusion is evident. Electronically Signed   By: Genevie Ann M.D.   On: 10/13/2018 07:45   Dg Chest Port 1 View  Result Date: 10/12/2018 CLINICAL DATA:  Hypoxia EXAM: PORTABLE CHEST 1 VIEW COMPARISON:  October 12, 2018 FINDINGS: Endotracheal tube tip is 5.5 cm above the carina. Nasogastric tube tip and side port are below the diaphragm. Right central catheter tip is in the superior vena cava. Left central catheter tip is in left innominate vein. No pneumothorax. There is no edema or consolidation. Heart is mildly enlarged with pulmonary vascularity normal. No adenopathy. No evident bone lesions. IMPRESSION: Tube and catheter positions as described without pneumothorax. No edema or consolidation. Mild cardiac enlargement. Electronically Signed   By: Lowella Grip III M.D.   On: 10/12/2018 20:07   Dg Chest Port 1 View  Result Date: 10/12/2018 CLINICAL DATA:  Central line placement. EXAM: PORTABLE CHEST 1 VIEW COMPARISON:  10/12/2018. FINDINGS: Interim placement of right IJ line. Its tip is over the superior vena cava. Left IJ line in stable position. Endotracheal tube and NG tube in stable position. Cardiomegaly. Pulmonary vascular prominence. Mild right base infiltrate. No pleural effusion or pneumothorax. IMPRESSION: 1. Interim placement right IJ line, its tip is over the superior vena cava. Endotracheal tube, NG tube, left IJ line stable position. 2.  Cardiomegaly with pulmonary venous congestion. 3.  Right base infiltrate. Electronically Signed   By:  Marcello Moores  Register   On: 10/12/2018 17:04   Dg Chest Port 1 View  Result Date: 10/12/2018 CLINICAL DATA:  Intubation.  Respiratory failure. EXAM: PORTABLE CHEST 1 VIEW COMPARISON:  10/09/2018. FINDINGS: Endotracheal tube has been withdrawn and its tip is now 2.7 cm above the carina. NG tube noted with tip below left hemidiaphragm. Left IJ line noted with tip over the proximal SVC. Cardiomegaly with bilateral pulmonary venous congestion and infiltrates/edema. No pleural effusion or pneumothorax. IMPRESSION: 1. Endotracheal tube has been withdrawn, its tip is now 2.7 cm above the carina. NG tube and left IJ line stable position. 2. Cardiomegaly with bilateral pulmonary infiltrates/edema. Small left pleural effusion. Similar findings noted on prior exam. Electronically Signed   By: [place]   On: 10/12/2018 10:51   Dg Chest Port 1 View  Result Date: 10/09/2018 CLINICAL DATA:  Respiratory failure EXAM: PORTABLE CHEST 1 VIEW COMPARISON:  Yesterday FINDINGS: Endotracheal tube tip projects 13 mm above the carina, lower than before. An orogastric tube at least reaches the diaphragm. Left IJ line with tip the distal left brachiocephalic. Symmetric increased opacity in the bilateral perihilar and lower chest. No Kerley lines or effusion. No pneumothorax. There is cardiopericardial enlargement. IMPRESSION: Lower endotracheal tube, tip 13 mm above the carina. Symmetric worsening aeration, likely edema. Electronically Signed   By: Monte Fantasia M.D.   On: 10/09/2018 10:02   Dg Chest Port 1 View  Result Date: 10/07/2018 CLINICAL DATA:  Central line placement EXAM: PORTABLE CHEST 1 VIEW COMPARISON:  10/07/2018 FINDINGS: Left central line remains in the upper mediastinum near the confluence of the innominate veins, unchanged. No pneumothorax.  Endotracheal tube is 4 cm above the carina. NG tube enters the stomach. Cardiomegaly. No confluent opacities or edema. No effusions or acute bony abnormality.  IMPRESSION: Left central line tip remains near the confluence of the innominate veins. No pneumothorax. Cardiomegaly.  No overt edema. Electronically Signed   By: Rolm Baptise M.D.   On: 10/07/2018 21:29   Dg Chest Portable 1 View  Result Date: 10/07/2018 CLINICAL DATA:  Central line placement. EXAM: PORTABLE CHEST 1 VIEW COMPARISON:  Earlier this day at 2019 hour FINDINGS: Tip of the left internal jugular central venous catheter in the region of the brachiocephalic/SVC confluence. No visualized pneumothorax, however the lung apices are not entirely included in the field of view. Endotracheal tube tip at the thoracic inlet 4.7 cm from the carina. Enteric tube in place with tip below the diaphragm. Low lung volumes persist. Unchanged cardiomegaly. Vascular congestion is similar. No large pleural effusion. IMPRESSION: 1. Tip of the left internal jugular central venous catheter in the region of the brachiocephalic/SVC confluence. No visualized pneumothorax. 2. Low lung volumes with unchanged cardiomegaly and vascular congestion. Electronically Signed   By: Keith Rake M.D.   On: 10/07/2018 21:29   Dg Chest Portable 1 View  Result Date: 10/07/2018 CLINICAL DATA:  Post intubation EXAM: PORTABLE CHEST 1 VIEW COMPARISON:  10/07/2018 FINDINGS: Endotracheal tube is 2 cm above the carina. NG tube enters the stomach. Cardiomegaly with vascular congestion, similar to prior study. No confluent opacities, effusions or pneumothorax. No definite visible rib fracture as questioned on prior chest x-ray. IMPRESSION: Endotracheal tube 2 cm above the carina. Mild cardiomegaly, vascular congestion. Electronically Signed   By: Rolm Baptise M.D.   On: 10/07/2018 20:39   Dg Chest Portable 1 View  Result Date: 10/07/2018 CLINICAL DATA:  CPR EXAM: PORTABLE CHEST 1 VIEW COMPARISON:  05/12/2018 FINDINGS: Heart size is mildly enlarged. Mild vascular congestion. No confluent opacities, effusions or pneumothorax. Suspect left  lateral rib fractures, but difficult to visualize. IMPRESSION: Cardiomegaly, vascular congestion.  No effusion or pneumothorax. Suspect left lateral rib fractures, but difficult to visualize and characterize. Electronically Signed   By: Rolm Baptise M.D.   On: 10/07/2018 19:56   Vas [place] Lower Extremity Venous (dvt)  Result Date: 10/08/2018  Lower Venous Study Indications: Edema.  Limitations: Poor ultrasound/tissue interface, body habitus and patient positioning. Performing Technologist: Abram Sander  Examination Guidelines: A complete evaluation includes B-mode imaging, spectral Doppler, color Doppler, and power Doppler as needed of all accessible portions of each vessel. Bilateral testing is considered an integral part of a complete examination. Limited examinations for reoccurring indications may be performed as noted.  Right Venous Findings: +---------+---------------+---------+-----------+----------+--------------+          CompressibilityPhasicitySpontaneityPropertiesSummary        +---------+---------------+---------+-----------+----------+--------------+ CFV      Full           Yes      Yes                                 +---------+---------------+---------+-----------+----------+--------------+ SFJ      Full                                                        +---------+---------------+---------+-----------+----------+--------------+ FV Prox  Full                                                        +---------+---------------+---------+-----------+----------+--------------+  FV Mid   Full                                                        +---------+---------------+---------+-----------+----------+--------------+ FV DistalFull                                                        +---------+---------------+---------+-----------+----------+--------------+ PFV      Full                                                         +---------+---------------+---------+-----------+----------+--------------+ POP      Full           Yes      Yes                                 +---------+---------------+---------+-----------+----------+--------------+ PTV      Full                                                        +---------+---------------+---------+-----------+----------+--------------+ PERO                                                  not visualized +---------+---------------+---------+-----------+----------+--------------+  Left Venous Findings: +---------+---------------+---------+-----------+----------+--------------+          CompressibilityPhasicitySpontaneityPropertiesSummary        +---------+---------------+---------+-----------+----------+--------------+ CFV      Full           Yes      Yes                                 +---------+---------------+---------+-----------+----------+--------------+ SFJ                                                   not visualized +---------+---------------+---------+-----------+----------+--------------+ FV Prox  Full                                                        +---------+---------------+---------+-----------+----------+--------------+ FV Mid   Full                                                        +---------+---------------+---------+-----------+----------+--------------+  FV DistalFull                                                        +---------+---------------+---------+-----------+----------+--------------+ PFV      Full                                                        +---------+---------------+---------+-----------+----------+--------------+ POP      Full           Yes      Yes                                 +---------+---------------+---------+-----------+----------+--------------+ PTV      Full                                                         +---------+---------------+---------+-----------+----------+--------------+ PERO                                                  not visualized +---------+---------------+---------+-----------+----------+--------------+    Summary: Right: There is no evidence of deep vein thrombosis in the lower extremity. However, portions of this examination were limited- see technologist comments above. No cystic structure found in the popliteal fossa. Left: There is no evidence of deep vein thrombosis in the lower extremity. However, portions of this examination were limited- see technologist comments above. No cystic structure found in the popliteal fossa.  *See table(s) above for measurements and observations. Electronically signed by Ruta Hinds MD on 10/08/2018 at 8:38:38 PM.    Final     Labs:  CBC: Recent Labs    10/19/18 1555 10/20/18 0433 10/21/18 0450 10/23/18 0413  WBC 23.7* 22.9* 18.5* 15.6*  HGB 8.7* 8.1* 7.4* 7.1*  HCT 32.0* 28.9* 26.4* 25.5*  PLT 228 251 271 313    COAGS: Recent Labs    10/07/18 2256 10/08/18 0618  10/19/18 0222 10/19/18 1147 10/20/18 0433 10/21/18 0450 10/22/18 0406 10/23/18 0413  INR 1.13 1.13  --  1.21 1.24  --   --   --   --   APTT 29 43*   < > 31  --  33 33 32 58*   < > = values in this interval not displayed.    BMP: Recent Labs    10/21/18 1542 10/22/18 0406 10/22/18 1702 10/23/18 0413  NA 136 135 135 134*  K 5.0 5.0 4.9 4.8  CL 98 98 107 105  CO2 23 21* 19* 19*  GLUCOSE 222* 186* 196* 182*  BUN 146* 157* 149* 97*  CALCIUM 10.2 10.1 9.6 7.4*  CREATININE 7.93* 8.40* 7.91* 5.10*  GFRNONAA 5* 5* 5* 8*  GFRAA 6* 5* 6* 10*    LIVER FUNCTION TESTS: Recent Labs    05/14/18 0334  10/07/18 1947 10/12/18 2013  10/19/18 1555  10/21/18 1542 10/22/18 0406 10/22/18 1702 10/23/18 0413  BILITOT 0.8  --  0.5 0.6  --  0.5  --   --   --   --   --   AST 12*  --  52* 75*  --  72*  --   --   --   --   --   ALT 10*  --  25 42  --  70*  --    --   --   --   --   ALKPHOS 72  --  79 94  --  200*  --   --   --   --   --   PROT 7.3  --  7.4 6.8  --  7.2  --   --   --   --   --   ALBUMIN 2.9*   < > 3.2* 2.3*   < > 2.0*   < > 1.8* 1.9* 1.9* 1.9*   < > = values in this interval not displayed.    TUMOR MARKERS: No results for input(s): AFPTM, CEA, CA199, CHROMGRNA in the last 8760 hours.  Assessment and Plan:  ARF Temp cath placed 10/25-CCM Now for temp to tunneled catheter placement L AVF placed 10/04/18-- not yet mature Pt is scheduled for Rad procedure 11/6 Will gain consent from family.  Thank you for this interesting consult.  I greatly enjoyed meeting Catherine Williams and look forward to participating in their care.  A copy of this report was sent to the requesting provider on this date.  Electronically Signed: Lavonia Drafts, PA-C 10/23/2018, 11:10 AM   I spent a total of 20 Minutes    in face to face in clinical consultation, greater than 50% of which was counseling/coordinating care for temp to tunneled dialysis catheter placement

## 2018-10-24 ENCOUNTER — Encounter (HOSPITAL_COMMUNITY): Payer: Medicare Other

## 2018-10-24 LAB — CBC WITH DIFFERENTIAL/PLATELET
ABS IMMATURE GRANULOCYTES: 0.91 10*3/uL — AB (ref 0.00–0.07)
Basophils Absolute: 0.1 10*3/uL (ref 0.0–0.1)
Basophils Relative: 0 %
Eosinophils Absolute: 0.3 10*3/uL (ref 0.0–0.5)
Eosinophils Relative: 2 %
HEMATOCRIT: 25.9 % — AB (ref 36.0–46.0)
HEMOGLOBIN: 7.2 g/dL — AB (ref 12.0–15.0)
IMMATURE GRANULOCYTES: 5 %
LYMPHS ABS: 2.4 10*3/uL (ref 0.7–4.0)
LYMPHS PCT: 13 %
MCH: 25.2 pg — ABNORMAL LOW (ref 26.0–34.0)
MCHC: 27.8 g/dL — ABNORMAL LOW (ref 30.0–36.0)
MCV: 90.6 fL (ref 80.0–100.0)
MONOS PCT: 8 %
Monocytes Absolute: 1.4 10*3/uL — ABNORMAL HIGH (ref 0.1–1.0)
NEUTROS PCT: 72 %
NRBC: 1.2 % — AB (ref 0.0–0.2)
Neutro Abs: 13.1 10*3/uL — ABNORMAL HIGH (ref 1.7–7.7)
Platelets: 341 10*3/uL (ref 150–400)
RBC: 2.86 MIL/uL — ABNORMAL LOW (ref 3.87–5.11)
RDW: 16.1 % — ABNORMAL HIGH (ref 11.5–15.5)
WBC: 18.2 10*3/uL — ABNORMAL HIGH (ref 4.0–10.5)

## 2018-10-24 LAB — GLUCOSE, CAPILLARY
GLUCOSE-CAPILLARY: 123 mg/dL — AB (ref 70–99)
GLUCOSE-CAPILLARY: 77 mg/dL (ref 70–99)
GLUCOSE-CAPILLARY: 102 mg/dL — AB (ref 70–99)
Glucose-Capillary: 108 mg/dL — ABNORMAL HIGH (ref 70–99)
Glucose-Capillary: 115 mg/dL — ABNORMAL HIGH (ref 70–99)
Glucose-Capillary: 135 mg/dL — ABNORMAL HIGH (ref 70–99)
Glucose-Capillary: 160 mg/dL — ABNORMAL HIGH (ref 70–99)

## 2018-10-24 LAB — MAGNESIUM: MAGNESIUM: 2.8 mg/dL — AB (ref 1.7–2.4)

## 2018-10-24 LAB — RENAL FUNCTION PANEL
Albumin: 2.1 g/dL — ABNORMAL LOW (ref 3.5–5.0)
Anion gap: 9 (ref 5–15)
BUN: 44 mg/dL — AB (ref 6–20)
CHLORIDE: 102 mmol/L (ref 98–111)
CO2: 24 mmol/L (ref 22–32)
Calcium: 9.5 mg/dL (ref 8.9–10.3)
Creatinine, Ser: 2.66 mg/dL — ABNORMAL HIGH (ref 0.44–1.00)
GFR calc non Af Amer: 19 mL/min — ABNORMAL LOW (ref >60)
GFR, EST AFRICAN AMERICAN: 21 mL/min — AB (ref >60)
Glucose, Bld: 141 mg/dL — ABNORMAL HIGH (ref 70–99)
Phosphorus: 3.2 mg/dL (ref 2.5–4.6)
Potassium: 4.6 mmol/L (ref 3.5–5.1)
Sodium: 135 mmol/L (ref 135–145)

## 2018-10-24 LAB — PROCALCITONIN: Procalcitonin: 2.22 ng/mL

## 2018-10-24 LAB — APTT: APTT: 93 s — AB (ref 24–36)

## 2018-10-24 LAB — POCT ACTIVATED CLOTTING TIME
Activated Clotting Time: 175 seconds
Activated Clotting Time: 197 seconds

## 2018-10-24 LAB — PROTIME-INR
INR: 1.19
Prothrombin Time: 15 seconds (ref 11.4–15.2)

## 2018-10-24 MED ORDER — HEPARIN SODIUM (PORCINE) 1000 UNIT/ML DIALYSIS
1000.0000 [IU] | INTRAMUSCULAR | Status: DC | PRN
  Administered 2018-10-24: 4000 [IU] via INTRAVENOUS_CENTRAL
  Filled 2018-10-24 (×2): qty 6

## 2018-10-24 MED ORDER — DEXTROSE 5 % IV SOLN
500.0000 mg | INTRAVENOUS | Status: AC
  Administered 2018-10-24: 500 mg via INTRAVENOUS
  Filled 2018-10-24: qty 0.5

## 2018-10-24 NOTE — Progress Notes (Signed)
[place] Progress Note Patient Name: Catherine Williams DOB: Mar 12, 1961 MRN: [id number]   Date of Service  10/24/2018  HPI/Events of Note  Repeat Hgb = 7.2 which has increased from 7.1.  eICU Interventions  Continue present management.      Intervention Category Major Interventions: Other:  Lysle Dingwall 10/24/2018, 3:54 AM

## 2018-10-24 NOTE — Progress Notes (Signed)
[facility] Progress Note Patient Name: Catherine Williams DOB: 03/22/1961 MRN: [id number]   Date of Service  10/24/2018  HPI/Events of Note  Vaginal bleeding persists. Now has soaked through pad and bed.   eICU Interventions  Will order: 1. Send AM labs including CBC with Platelets now.  2. D/C Heparin in CRRT. Notify Renal Service.  3. PT/INR and PTT now.  4. Type and Hold for PRBC     Intervention Category Major Interventions: Other:  [name] 10/24/2018, 2:46 AM

## 2018-10-24 NOTE — Progress Notes (Signed)
Patient ID: Catherine Williams, female   DOB: 01/04/1961, 57 y.o.   MRN: [id number]    Was tentatively scheduled for tunneled HD catheter placement today Wbc up to 18 today Low grade temp  Discussed with Dr Barbie Banner Will HOLD tunneled cath until wbc wnl  RN aware Will recheck cbc in am

## 2018-10-24 NOTE — Progress Notes (Signed)
While bathing pt, blood clot in vaginal area measuring 10cm(length) x 3.5cm(width) was found. Went through several washcloths that contained bright red blood. While blood was fresh, there was no trickle of blood noted. Notified CCM MD.  Menstrual pad was placed & will check pad frequently & monitor pt. No other signs of frank bleeding noted.

## 2018-10-24 NOTE — Progress Notes (Addendum)
Rechecked pt's menstrual pad. Pt had soaked through menstrual pad & bed pad beneath. Clots x2 the size of quarters found. CCM MD called. Verbally gave orders to turn off heparin in CRRT & let Nephro know in the AM. Placed orders to draw AM labs early & type&screen patient.    VSS. Will continue to monitor pt closely.

## 2018-10-24 NOTE — Progress Notes (Signed)
57 year old obese woman with OSA and CKD stage V admitted 10/20 after PEA cardiac arrest. Underwent hypothermia protocol andstarted on CRRT  10/26sustained another brief PEA arrest related to mucous plugging,underwent bronchoscopy She had 2 more arrest on 10/30 again attributed to mucous plugging and on 11/1 post tracheostomy placement, cardiac cath was negative for any obstructive CAD.  EP was consulted  Levophed is off this morning, CRRT was resumed 11/4 but stopped this morning since filter clotted.  She is awake and follows commands, obese appears weak and deconditioned, no bleeding around trach site, small 1 cm pressure wound on her sacrum, stage 2  Labs show mild leukocytosis WBC count back up to 18, decreasing BUN, stable anemia with hemoglobin of 7.2.  Impression/plan  AKI on CKD stage V-CRRT has been stopped, we will give her a break, due to increasing leukocytosis will discontinue HD catheter, eventually will need placement of permacath.  Acute respiratory failure/tracheostomy-spontaneous breathing trials with goal of trach collar, she is weaning on pressure support 15/5.  Recurrent cardiac arrest-no cause defined, no obstructive CAD, no indication for PPM  Serratia UTI -can stop antibiotics after today if procalcitonin low, otherwise will continue for 14 days.  Acute encephalopathy with severe deconditioning-start PT, appears to be neurologically intact.  The patient is critically ill with multiple organ systems failure and requires high complexity decision making for assessment and support, frequent evaluation and titration of therapies, application of advanced monitoring technologies and extensive interpretation of multiple databases. Critical Care Time devoted to patient care services described in this note independent of APP/resident  time is 32 minutes.   Leanna Sato Elsworth Soho MD

## 2018-10-24 NOTE — Progress Notes (Signed)
[facility] NEPHROLOGY PROGRESS NOTE  Assessment/ Plan: Pt is a 57 y.o. yo female with advanced CKD status post AV fistula creation, admitted with cardiac arrest and acute on chronic renal failure.  #Acute on advance CKD after cardiac arrest:  She remains anuric.  She required CRRT from 10/25-10/30 (AM).  Patient did not tolerate intermittent hemodialysis on 11/1, only 15 minutes and went into PEA arrest x2.  Patient underwent emergent cardiac cath with no PCI. -Restarted CRRT on 10/22/2018.  -IV heparin was discontinued last night because of vaginal bleed.  The filter clotted therefore CRRT was discontinued this morning.  Patient is currently off of pressors.  Volume status acceptable.  Hold off on CRRT today and monitor closely.  Likely try intermittent hemodialysis either tomorrow or on Friday if blood pressure permits. -IR was consulted for tunneled catheter placement.  The procedure was held today because of worsening leukocytosis and low-grade temperature.  I discussed with Dr Elsworth Soho to discontinue the temporary dialysis catheter and monitor leukocytosis and fever curve. -She has left AV fistula maturing.  #Hypotension: She is off Levophed this morning.  Blood pressure improving.  Continue to monitor.  #Hyperkalemia: Serum potassium level improved to improved.   #Anemia, multifactorial, ABLA due to vagina bleed: Iron saturation 38%, hemoglobin >7. Received aranesp on 11/4.  #Cardiac arrest status post cardiac cath, mid RCA 55% not enough to explain PEA arrest per cardiology.  Currently intubated.   Subjective: Seen and examined at bedside. Overnight event noted.  CRRT was discontinued.  Patient is on trach and vent.  Objective Vital signs in last 24 hours: Vitals:   10/24/18 0600 10/24/18 0700 10/24/18 0801 10/24/18 0810  BP:    (!) 91/44  Pulse: 84 84  85  Resp: (!) 29 (!) 25  (!) 25  Temp:   99.1 F (37.3 C)   TempSrc:   Axillary   SpO2: 100% 100%  100%  Weight:       Height:       Weight change: 2.268 kg  Intake/Output Summary (Last 24 hours) at 10/24/2018 0859 Last data filed at 10/24/2018 0700 Gross per 24 hour  Intake 1450.13 ml  Output 2721 ml  Net -1270.87 ml       Labs: Basic Metabolic Panel: Recent Labs  Lab 10/23/18 0413 10/23/18 1552 10/24/18 0304  NA 134* 136 135  K 4.8 4.6 4.6  CL 105 104 102  CO2 19* 22 24  GLUCOSE 182* 159* 141*  BUN 97* 65* 44*  CREATININE 5.10* 3.61* 2.66*  CALCIUM 7.4* 8.4* 9.5  PHOS 3.5 3.2 3.2   Liver Function Tests: Recent Labs  Lab 10/19/18 1555  10/23/18 0413 10/23/18 1552 10/24/18 0304  AST 72*  --   --   --   --   ALT 70*  --   --   --   --   ALKPHOS 200*  --   --   --   --   BILITOT 0.5  --   --   --   --   PROT 7.2  --   --   --   --   ALBUMIN 2.0*   < > 1.9* 2.0* 2.1*   < > = values in this interval not displayed.   No results for input(s): LIPASE, AMYLASE in the last 168 hours. No results for input(s): AMMONIA in the last 168 hours. CBC: Recent Labs  Lab 10/18/18 0258  10/19/18 1555 10/20/18 0433 10/21/18 0450 10/23/18 0413 10/24/18 0244  WBC 22.2*   < > 23.7* 22.9* 18.5* 15.6* 18.2*  NEUTROABS 15.5*  --   --   --   --   --  13.1*  HGB 8.8*   < > 8.7* 8.1* 7.4* 7.1* 7.2*  HCT 31.5*   < > 32.0* 28.9* 26.4* 25.5* 25.9*  MCV 91.3   < > 92.2 88.9 88.0 89.2 90.6  PLT 196   < > 228 251 271 313 341   < > = values in this interval not displayed.   Cardiac Enzymes: No results for input(s): CKTOTAL, CKMB, CKMBINDEX, TROPONINI in the last 168 hours. CBG: Recent Labs  Lab 10/23/18 1159 10/23/18 1556 10/23/18 2024 10/23/18 2353 10/24/18 0759  GLUCAP 168* 152* 123* 115* 77    Iron Studies: No results for input(s): IRON, TIBC, TRANSFERRIN, FERRITIN in the last 72 hours. Studies/Results: Dg Chest Port 1 View  Result Date: 10/23/2018 CLINICAL DATA:  Acute respiratory failure. EXAM: PORTABLE CHEST 1 VIEW COMPARISON:  Radiograph of October 19, 2018. FINDINGS: Stable  cardiomegaly. Tracheostomy and nasogastric tubes are unchanged in position. Stable right internal jugular catheter is noted. Stable central pulmonary vascular congestion and possible pulmonary edema is noted. No pneumothorax or pleural effusion is noted. Bony thorax is unremarkable. IMPRESSION: Stable support apparatus. Stable cardiomegaly with central pulmonary vascular congestion and possible pulmonary edema. Electronically Signed   By: Marijo Conception, M.D.   On: 10/23/2018 07:31    Medications: Infusions: . sodium chloride 10 mL/hr at 10/24/18 0700  . dexmedetomidine (PRECEDEX) IV infusion 0.2 mcg/kg/hr (10/24/18 0700)  . feeding supplement (VITAL HIGH PROTEIN) 1,000 mL (10/24/18 0854)  . norepinephrine (LEVOPHED) Adult infusion Stopped (10/24/18 0601)    Scheduled Medications: . aspirin  81 mg Oral Daily  . ceFEPime (MAXIPIME) IV  500 mg Intravenous Q24H  . chlorhexidine gluconate (MEDLINE KIT)  15 mL Mouth Rinse BID  . Chlorhexidine Gluconate Cloth  6 each Topical Daily  . insulin aspart  0-20 Units Subcutaneous Q4H  . insulin aspart  6 Units Subcutaneous Q6H  . insulin glargine  30 Units Subcutaneous Daily  . ipratropium-albuterol  3 mL Nebulization Q6H  . mouth rinse  15 mL Mouth Rinse 10 times per day  . multivitamin  15 mL Per Tube Daily  . pantoprazole sodium  40 mg Per Tube Daily  . rosuvastatin  10 mg Per Tube QPM    have reviewed scheduled and prn medications.  Physical Exam: General: Trach and on vent, following simple commands Heart:RRR, s1s2 nl Lungs: Coarse breath sound bilateral, no wheezing Abdomen:soft, Non-tender, non-distended Extremities:No edema Dialysis Access: Temporary dialysis catheter site looks clean.  Glynis Hunsucker Prasad Chade Pitner 10/24/2018,8:59 AM  LOS: 17 days

## 2018-10-24 NOTE — Progress Notes (Addendum)
  Tracheostomy Team Patient Details Name: Catherine Williams MRN: [id number] DOB: Oct 02, 1961 Today's Date: 10/24/2018 Time:  -     SLP following as part of trach team. Pt on ventilator; having procedure for HD catheter placement today. Will follow for appropriateness for PMV (either in-line vent or trach collar).                        Houston Siren 10/24/2018, 9:05 AM   Orbie Pyo Colvin Caroli.Ed Risk analyst [phone] Office [phone]

## 2018-10-24 NOTE — Progress Notes (Signed)
[place] Progress Note Patient Name: Catherine Williams DOB: 07/09/1961 MRN: [id number]   Date of Service  10/24/2018  HPI/Events of Note  Notified of vaginal clot with BRB discovered while bathing patient. No increase in vasopressor requirements. Last Hgb = 7.1. BP = 117/47 with MAP = 66.   eICU Interventions  Plan: 1. Continue CRRT with Heparin for now.  2. Monitor Hgb, BP, vasopressor requirements closely for indication that Heparin needs to be D/Ced.     Intervention Category Major Interventions: Other:  , Cornelia Copa 10/24/2018, 2:18 AM

## 2018-10-24 NOTE — Evaluation (Signed)
Physical Therapy Evaluation Patient Details Name: Catherine Williams MRN: [id number] DOB: [birth year] Today's Date: 10/24/2018   History of Present Illness  Pt is a 57 y.o. F with significant PMH of obesity with OSA and CKD stage V admitted on 10/20 after PEA cardiac arrest and intubated. Underwent hypothermia protocol and started on CRRT. 10/26 sustained another brief PEA arrest and underwent bronchoscopy. Had 2 more arrests on 10/30 and 11/1 post tracheostomy placement, cardiac cath negative for obstructive CAD. Also has sacral stage 2 ulcer.  Clinical Impression  Pt admitted with above diagnosis. Pt currently with functional limitations due to the deficits listed below (see PT Problem List). On PT evaluation, patient presenting with decreased functional mobility secondary to pulmonary issues (currently mechanically ventilated), weakness, poor balance, and decreased activity tolerance. Performing bed mobility with three person maximal assistance. HR 95 bpm, 38 RR, 90/52 BP, 100% SpO2 via ventilator, 40% FiO2, 5 PEEP.  Will trial Clarise Cruz Plus next session for out of bed mobility.  Pt will benefit from skilled PT to increase their independence and safety with mobility to allow discharge to the venue listed below.       Follow Up Recommendations LTACH    Equipment Recommendations  Other (comment)(TBD)    Recommendations for Other Services OT consult     Precautions / Restrictions Precautions Precautions: Fall Precaution Comments: Vent, NG tube Restrictions Weight Bearing Restrictions: No      Mobility  Bed Mobility Overal bed mobility: Needs Assistance Bed Mobility: Supine to Sit;Sit to Supine     Supine to sit: Max assist(+3) Sit to supine: Max assist(+3)   General bed mobility comments: MaxA + 3 for supine <> sit. pt with some BLE initiation but ultimately requiring assist at trunk and with BLE's off of bed with use of bed pad to bring hips forward.   Transfers                  General transfer comment: deferred  Ambulation/Gait                Stairs            Wheelchair Mobility    Modified Rankin (Stroke Patients Only)       Balance Overall balance assessment: Needs assistance Sitting-balance support: Bilateral upper extremity supported;Feet supported Sitting balance-Leahy Scale: Poor Sitting balance - Comments: Requiring up to maximal assistance for balance with increased difficulty achieving anterior shift. Use of chair placed in front of patient to encourage forward lean.                                      Pertinent Vitals/Pain Pain Assessment: Faces Faces Pain Scale: Hurts little more Pain Location: bottom? Pain Descriptors / Indicators: Grimacing    Home Living Family/patient expects to be discharged to:: Unsure                      Prior Function Level of Independence: Needs assistance   Gait / Transfers Assistance Needed: ambulates with SPC PRN  ADL's / Homemaking Assistance Needed: Family does grocery shopping and assists with BADL as needed  Comments: PLOF obtained from previous inpatient admission stay     Hand Dominance   Dominant Hand: Right    Extremity/Trunk Assessment   Upper Extremity Assessment Upper Extremity Assessment: RUE deficits/detail;LUE deficits/detail RUE Deficits / Details: Grossly 3/5 LUE Deficits / Details: Grossly 2+/5  Lower Extremity Assessment Lower Extremity Assessment: RLE deficits/detail;LLE deficits/detail RLE Deficits / Details: Grossly 2+/5 LLE Deficits / Details: At least anti gravity    Cervical / Trunk Assessment Cervical / Trunk Assessment: Other exceptions Cervical / Trunk Exceptions: increased body habitus  Communication   Communication: Tracheostomy  Cognition Arousal/Alertness: Awake/alert Behavior During Therapy: WFL for tasks assessed/performed Overall Cognitive Status: Difficult to assess                                  General Comments: Very pleasant and participatory in therapy. Following simple commands > 75% of the time.      General Comments      Exercises     Assessment/Plan    PT Assessment Patient needs continued PT services  PT Problem List Decreased strength;Decreased activity tolerance;Decreased balance;Decreased mobility;Cardiopulmonary status limiting activity;Obesity       PT Treatment Interventions DME instruction;Gait training;Functional mobility training;Therapeutic activities;Therapeutic exercise;Balance training;Patient/family education    PT Goals (Current goals can be found in the Care Plan section)  Acute Rehab PT Goals Patient Stated Goal: "go to [employer]." PT Goal Formulation: With patient Time For Goal Achievement: 11/07/18 Potential to Achieve Goals: Fair    Frequency Min 2X/week   Barriers to discharge        Co-evaluation               AM-PAC PT "6 Clicks" Daily Activity  Outcome Measure Difficulty turning over in bed (including adjusting bedclothes, sheets and blankets)?: Unable Difficulty moving from lying on back to sitting on the side of the bed? : Unable Difficulty sitting down on and standing up from a chair with arms (e.g., wheelchair, bedside commode, etc,.)?: Unable Help needed moving to and from a bed to chair (including a wheelchair)?: A Lot Help needed walking in hospital room?: Total Help needed climbing 3-5 steps with a railing? : Total 6 Click Score: 7    End of Session   Activity Tolerance: Patient limited by fatigue Patient left: in bed;with call bell/phone within reach Nurse Communication: Mobility status PT Visit Diagnosis: Other abnormalities of gait and mobility (R26.89);Muscle weakness (generalized) (M62.81)    Time: 4098-1191 PT Time Calculation (min) (ACUTE ONLY): 31 min   Charges:   PT Evaluation $PT Eval High Complexity: 1 High PT Treatments $Therapeutic Activity: 8-22 mins        Ellamae Sia, PT,  DPT Acute Rehabilitation Services Pager [phone] Office [phone]   Willy Eddy 10/24/2018, 4:26 PM

## 2018-10-25 ENCOUNTER — Inpatient Hospital Stay (HOSPITAL_COMMUNITY): Payer: Medicare Other

## 2018-10-25 ENCOUNTER — Telehealth: Payer: Self-pay | Admitting: Adult Health

## 2018-10-25 LAB — CBC
HEMATOCRIT: 24.3 % — AB (ref 36.0–46.0)
Hemoglobin: 6.5 g/dL — CL (ref 12.0–15.0)
MCH: 24.7 pg — ABNORMAL LOW (ref 26.0–34.0)
MCHC: 26.7 g/dL — AB (ref 30.0–36.0)
MCV: 92.4 fL (ref 80.0–100.0)
NRBC: 2.2 % — AB (ref 0.0–0.2)
Platelets: 334 10*3/uL (ref 150–400)
RBC: 2.63 MIL/uL — ABNORMAL LOW (ref 3.87–5.11)
RDW: 16.4 % — ABNORMAL HIGH (ref 11.5–15.5)
WBC: 16.8 10*3/uL — ABNORMAL HIGH (ref 4.0–10.5)

## 2018-10-25 LAB — RENAL FUNCTION PANEL
ANION GAP: 10 (ref 5–15)
Albumin: 1.9 g/dL — ABNORMAL LOW (ref 3.5–5.0)
BUN: 56 mg/dL — ABNORMAL HIGH (ref 6–20)
CALCIUM: 10.1 mg/dL (ref 8.9–10.3)
CO2: 23 mmol/L (ref 22–32)
CREATININE: 3.99 mg/dL — AB (ref 0.44–1.00)
Chloride: 103 mmol/L (ref 98–111)
GFR, EST AFRICAN AMERICAN: 13 mL/min — AB (ref >60)
GFR, EST NON AFRICAN AMERICAN: 11 mL/min — AB (ref >60)
Glucose, Bld: 125 mg/dL — ABNORMAL HIGH (ref 70–99)
Phosphorus: 5.1 mg/dL — ABNORMAL HIGH (ref 2.5–4.6)
Potassium: 4.6 mmol/L (ref 3.5–5.1)
SODIUM: 136 mmol/L (ref 135–145)

## 2018-10-25 LAB — GLUCOSE, CAPILLARY
GLUCOSE-CAPILLARY: 116 mg/dL — AB (ref 70–99)
GLUCOSE-CAPILLARY: 119 mg/dL — AB (ref 70–99)
GLUCOSE-CAPILLARY: 107 mg/dL — AB (ref 70–99)
GLUCOSE-CAPILLARY: 120 mg/dL — AB (ref 70–99)
Glucose-Capillary: 117 mg/dL — ABNORMAL HIGH (ref 70–99)
Glucose-Capillary: 115 mg/dL — ABNORMAL HIGH (ref 70–99)

## 2018-10-25 LAB — PREPARE RBC (CROSSMATCH)

## 2018-10-25 LAB — MAGNESIUM: Magnesium: 2.5 mg/dL — ABNORMAL HIGH (ref 1.7–2.4)

## 2018-10-25 LAB — PROCALCITONIN: PROCALCITONIN: 2.34 ng/mL

## 2018-10-25 MED ORDER — IOPAMIDOL (ISOVUE-370) INJECTION 76%
100.0000 mL | Freq: Once | INTRAVENOUS | Status: AC | PRN
  Administered 2018-10-25: 75 mL via INTRAVENOUS

## 2018-10-25 MED ORDER — HEPARIN SODIUM (PORCINE) 5000 UNIT/ML IJ SOLN
5000.0000 [IU] | Freq: Three times a day (TID) | INTRAMUSCULAR | Status: DC
  Administered 2018-10-25 – 2018-10-26 (×3): 5000 [IU] via SUBCUTANEOUS
  Filled 2018-10-25 (×3): qty 1

## 2018-10-25 MED ORDER — SODIUM CHLORIDE 0.9% IV SOLUTION: Freq: Once | INTRAVENOUS | Status: AC

## 2018-10-25 MED ORDER — DEXTROSE 5 % IV SOLN
500.0000 mg | INTRAVENOUS | Status: DC
  Administered 2018-10-25 – 2018-10-26 (×2): 500 mg via INTRAVENOUS
  Filled 2018-10-25 (×3): qty 0.5

## 2018-10-25 MED ORDER — ONDANSETRON HCL 4 MG/2ML IJ SOLN: 4.0000 mg | Freq: Four times a day (QID) | INTRAMUSCULAR | Status: DC | PRN

## 2018-10-25 NOTE — Progress Notes (Signed)
Physical Therapy Treatment Patient Details Name: Catherine Williams MRN: [id number] DOB: [birth year] Today's Date: 10/25/2018    History of Present Illness Pt is a 57 y.o. F with significant PMH of obesity with OSA and CKD stage V admitted on 10/20 after PEA cardiac arrest and intubated. Underwent hypothermia protocol and started on CRRT. 10/26 sustained another brief PEA arrest and underwent bronchoscopy. Had 2 more arrests on 10/30 and 11/1 post tracheostomy placement, cardiac cath negative for obstructive CAD. Also has sacral stage 2 ulcer.    PT Comments    Session focused on continued progression of activity tolerance, balance functional mobility. Pt more lethargic compared to yesterday and on full vent support via tracheostomy. Requiring two person total assistance for bed mobility. Able to progress to intermittent min guard assist with static sitting EOB with multimodal cueing. D/c plan remains appropriate.     Follow Up Recommendations  LTACH     Equipment Recommendations  Other (comment)(defer)    Recommendations for Other Services OT consult     Precautions / Restrictions Precautions Precautions: Fall Precaution Comments: Vent Restrictions Weight Bearing Restrictions: No    Mobility  Bed Mobility Overal bed mobility: Needs Assistance Bed Mobility: Rolling;Sidelying to Sit;Sit to Supine Rolling: +2 for physical assistance;Total assist Sidelying to sit: +2 for physical assistance;Total assist   Sit to supine: Total assist(+3)   General bed mobility comments: assist for all aspects, minimal ability to participate  Transfers                 General transfer comment: deferred  Ambulation/Gait                 Stairs             Wheelchair Mobility    Modified Rankin (Stroke Patients Only)       Balance Overall balance assessment: Needs assistance Sitting-balance support: Bilateral upper extremity supported;Feet supported Sitting  balance-Leahy Scale: Poor Sitting balance - Comments: max assist initially, progressed to min guard for up to 2 minutes. Postural control: Posterior lean                                  Cognition Arousal/Alertness: Lethargic Behavior During Therapy: Flat affect(will smile when prompted) Overall Cognitive Status: Difficult to assess                                        Exercises Other Exercises Other Exercises: Static and dynamic seated balance exercises focusing on anterior weight shift with and without bilateral hand support. Performed lateral leans onto right and left elbows but required assist back to upright. Cues required for keeping eyes open and neutral cervical alignment as patient tends to display left cervical rotation/flexion    General Comments        Pertinent Vitals/Pain Pain Assessment: Faces Faces Pain Scale: Hurts even more Pain Location: neck with gentle ROM, trach site Pain Descriptors / Indicators: Grimacing Pain Intervention(s): Monitored during session  Vitals: 40% FiO2, 5 PEEP, 31 RR on vent support via tracheostomy    Home Living Family/patient expects to be discharged to:: Unsure                    Prior Function Level of Independence: Needs assistance  Gait / Transfers Assistance Needed: ambulates with SPC PRN ADL's /  Homemaking Assistance Needed: Family does grocery shopping and assists with BADL as needed Comments: PLOF obtained from previous inpatient admission stay   PT Goals (current goals can now be found in the care plan section) Acute Rehab PT Goals Patient Stated Goal: "go to [employer]." Potential to Achieve Goals: Fair    Frequency    Min 2X/week      PT Plan Current plan remains appropriate    Co-evaluation PT/OT/SLP Co-Evaluation/Treatment: Yes Reason for Co-Treatment: For patient/therapist safety;To address functional/ADL transfers;Complexity of the patient's impairments (multi-system  involvement) PT goals addressed during session: Mobility/safety with mobility;Balance OT goals addressed during session: Strengthening/ROM      AM-PAC PT "6 Clicks" Daily Activity  Outcome Measure  Difficulty turning over in bed (including adjusting bedclothes, sheets and blankets)?: Unable Difficulty moving from lying on back to sitting on the side of the bed? : Unable Difficulty sitting down on and standing up from a chair with arms (e.g., wheelchair, bedside commode, etc,.)?: Unable Help needed moving to and from a bed to chair (including a wheelchair)?: Total Help needed walking in hospital room?: Total Help needed climbing 3-5 steps with a railing? : Total 6 Click Score: 6    End of Session   Activity Tolerance: Patient limited by fatigue Patient left: in bed;with call bell/phone within reach Nurse Communication: Mobility status PT Visit Diagnosis: Other abnormalities of gait and mobility (R26.89);Muscle weakness (generalized) (M62.81)     Time: 8403-7543 PT Time Calculation (min) (ACUTE ONLY): 40 min  Charges:  $Therapeutic Activity: 8-22 mins                     Ellamae Sia, PT, DPT Acute Rehabilitation Services Pager [phone] Office [phone]   Willy Eddy 10/25/2018, 4:49 PM

## 2018-10-25 NOTE — Progress Notes (Signed)
RT note: RT and RN transported patient to CT and back to 2H09. Vital signs stable through out.

## 2018-10-25 NOTE — Progress Notes (Signed)
Patient ID: Catherine Williams, female   DOB: 15-Jan-1961, 57 y.o.   MRN: [id number]  Wbc trending down 16.8 today  Will plan for tunneled HD 11/8 in IR Will recheck cbc in am

## 2018-10-25 NOTE — Telephone Encounter (Signed)
Called [employer]. Spoke with Shirlean Mylar she stated that neither the patient nor the patients daughter was in their system with verified names or DOB. Called patient. Unable to reach left message to give [place] a call back.

## 2018-10-25 NOTE — Progress Notes (Signed)
57 year old obese woman with OSA and CKD stage V admitted 10/20 after PEA cardiac arrest. Underwent hypothermia protocol andstarted on CRRT  10/26sustained another brief PEA arrest related to mucous plugging,underwent bronchoscopy She had 2 more arrest on 10/30 again attributed to mucous plugging and on 11/1 post tracheostomy placement, cardiac cath was negative for any obstructive CAD. EP was consulted  CRRT was stopped 11/6, she is off Levophed now.  On exam-awake and follows commands, appears weak and deconditioned, obese, S1-S2 distant, 1+ edema, soft abdomen.  Labs show hemoglobin dropped down to 6.5, electrolytes normal, BUN slight rising to 56.  Impression/plan  Acute respiratory failure/tracheostomy-she is tolerating pressure support 14/5, goal would be trach collar when she tolerates lower pressure support.  AKI on CKD stage V-getting a break on dialysis, WBC count is decreasing now and permacath can be placed and then we can trial intermittent dialysis again.  Serratia UTI-since procalcitonin still slight high, will continue cefepime for 14 days total.  Recurrent cardiac arrest/PEA-no cause defined, no obstructive CAD, no indication for PPM.  Daughter requests to speak to cardiologist and I will try to facilitate.  She needs aggressive rehab and weaning.  She has been accepted to select LTAC.  Would like to trial 1 session of intermittent dialysis before we transfer.  The patient is critically ill with multiple organ systems failure and requires high complexity decision making for assessment and support, frequent evaluation and titration of therapies, application of advanced monitoring technologies and extensive interpretation of multiple databases. Critical Care Time devoted to patient care services described in this note independent of APP/resident  time is 32 minutes.   Leanna Sato Elsworth Soho MD [phone]

## 2018-10-25 NOTE — Progress Notes (Addendum)
Progress Note  Patient Name: WILLINE SCHWALBE Date of Encounter: 10/25/2018  Primary Cardiologist: Dr Burt Knack  Subjective   On ventilator via trach; no Chest pain or dyspnea  Inpatient Medications    Scheduled Meds: . aspirin  81 mg Oral Daily  . ceFEPime (MAXIPIME) IV  500 mg Intravenous Q24H  . chlorhexidine gluconate (MEDLINE KIT)  15 mL Mouth Rinse BID  . insulin aspart  0-20 Units Subcutaneous Q4H  . insulin aspart  6 Units Subcutaneous Q6H  . insulin glargine  30 Units Subcutaneous Daily  . ipratropium-albuterol  3 mL Nebulization Q6H  . mouth rinse  15 mL Mouth Rinse 10 times per day  . multivitamin  15 mL Per Tube Daily  . pantoprazole sodium  40 mg Per Tube Daily  . rosuvastatin  10 mg Per Tube QPM   Continuous Infusions: . sodium chloride 10 mL/hr at 10/25/18 0730  . dexmedetomidine (PRECEDEX) IV infusion Stopped (10/24/18 0823)  . feeding supplement (VITAL HIGH PROTEIN) Stopped (10/24/18 2345)  . norepinephrine (LEVOPHED) Adult infusion Stopped (10/24/18 0601)   PRN Meds: sodium chloride, acetaminophen (TYLENOL) oral liquid 160 mg/5 mL, bisacodyl, fentaNYL (SUBLIMAZE) injection, heparin, ondansetron (ZOFRAN) IV   Vital Signs    Vitals:   10/25/18 0801 10/25/18 1120 10/25/18 1135 10/25/18 1200  BP:  (!) 133/53  (!) 99/47  Pulse:  87 88 86  Resp:  (!) 27 (!) 8 14  Temp: 98.9 F (37.2 C) 99.1 F (37.3 C) 99.2 F (37.3 C)   TempSrc: Oral Oral Oral   SpO2:   100% 100%  Weight:      Height:        Intake/Output Summary (Last 24 hours) at 10/25/2018 1255 Last data filed at 10/25/2018 1200 Gross per 24 hour  Intake 634.98 ml  Output -  Net 634.98 ml   Filed Weights   10/23/18 0600 10/24/18 0409 10/25/18 0417  Weight: 132 kg 134.3 kg 134.3 kg    Telemetry    Sinus with 10 beats NSVT - Personally Reviewed  Physical Exam   GEN: No acute distress.   Neck:  Status post tracheostomy Cardiac: RRR Respiratory: CTA anteriorly GI: Soft, nontender,  non-distended  MS: No edema Neuro: Moves all ext  Labs    Chemistry Recent Labs  Lab 10/19/18 1555  10/23/18 1552 10/24/18 0304 10/25/18 0416  NA 134*   < > 136 135 136  K 3.9   < > 4.6 4.6 4.6  CL 99   < > 104 102 103  CO2 21*   < > 22 24 23   GLUCOSE 342*   < > 159* 141* 125*  BUN 108*   < > 65* 44* 56*  CREATININE 5.73*   < > 3.61* 2.66* 3.99*  CALCIUM 10.3   < > 8.4* 9.5 10.1  PROT 7.2  --   --   --   --   ALBUMIN 2.0*   < > 2.0* 2.1* 1.9*  AST 72*  --   --   --   --   ALT 70*  --   --   --   --   ALKPHOS 200*  --   --   --   --   BILITOT 0.5  --   --   --   --   GFRNONAA 7*   < > 13* 19* 11*  GFRAA 8*   < > 15* 21* 13*  ANIONGAP 14   < > 10 9 10    < > =  values in this interval not displayed.     Hematology Recent Labs  Lab 10/23/18 0413 10/24/18 0244 10/25/18 0640  WBC 15.6* 18.2* 16.8*  RBC 2.86* 2.86* 2.63*  HGB 7.1* 7.2* 6.5*  HCT 25.5* 25.9* 24.3*  MCV 89.2 90.6 92.4  MCH 24.8* 25.2* 24.7*  MCHC 27.8* 27.8* 26.7*  RDW 15.8* 16.1* 16.4*  PLT 313 341 334    Patient Profile     57 y.o. female with obstructive sleep apnea, chronic kidney disease, morbid obesity, hypertension, diabetes mellitus admitted following PEA cardiac arrest.  She has had subsequent PEA cardiac arrests in hospital felt to be related to respiratory distress and mucus plugging.  Echocardiogram October 21 showed normal LV function and moderate diastolic dysfunction.  Cardiac catheterization November 2019 showed 55% proximal to mid right coronary artery and 30% mid LAD.  Mildly elevated left ventricular end-diastolic pressure.  Assessment & Plan    1 status post cardiac arrest-I was contacted today by Dr. Elsworth Soho to discuss this admission with patient's daughter.  I reviewed the chart and had extensive discussions with Dr. Elsworth Soho.  Her arrests have been felt to be PEA arrests and possibly secondary to mucous plugging and respiratory distress.  There is also a question of metabolic  abnormalities/acidosis.  Would also consider CTA to R/O pulmonary embolus now that she is dialysis dependent. Her LV function is normal and she does not have obstructive coronary disease on her cardiac catheterization.  Given that these were PEA arrests no further cardiac intervention indicated. Need to continue to optimize respiratory and metabolic status. I did call the patient's daughter by phone as well.  I explained the above to her.  She asked if patient could have future episodes.  I explained that this is unknown at present.  I spent greater than 60 minutes reviewing chart, discussing patient with Dr. Elsworth Soho and patient's daughter by phone (12:00-1:00 PM).  2 acute on chronic stage V kidney disease-nephrology following.  3 respiratory failure-status post tracheostomy; per critical care medicine.  4 NSVT-noted on telemetry; BP will not allow beta blocker; can consider amiodarone for suppression if more prolonged arrhythmia. As outlined, LV function normal. Discussed with Dr Lovena Le.   Would arrange follow-up with Dr. Burt Knack when she recovers from present illness.   For questions or updates, please contact [place] Please consult [email or web address] for contact info under        Signed, Kirk Ruths, MD  10/25/2018, 12:55 PM

## 2018-10-25 NOTE — Progress Notes (Signed)
[facility] NEPHROLOGY PROGRESS NOTE  Assessment/ Plan: Pt is a 57 y.o. yo female with advanced CKD status post AV fistula creation, admitted with cardiac arrest and acute on chronic renal failure.  #Acute on advance CKD after cardiac arrest:  She remains anuric.  She required CRRT from 10/25-10/30 (AM).  Patient did not tolerate intermittent hemodialysis on 11/1, only 15 minutes and went into PEA arrest x2.  Patient underwent emergent cardiac cath with no PCI. -Restarted CRRT on 10/22/2018 to 10/24/18.  -The temporary dialysis catheter was removed yesterday because of leukocytosis.  Now leukocytosis is trending down.  Plan for tunneled catheter either today or tomorrow by IR. -Noted creatinine level is trending up and no urine output.  Volume status and potassium is acceptable.  No plan for dialysis today and will assess tomorrow when patient has access.  Hopefully patient will tolerate intermittent dialysis tomorrow. -She has left AV fistula maturing.  #Hypotension: Blood pressure is improving off pressors.  Continue to monitor.  She is off Levophed this morning.  Blood pressure improving.  Continue to monitor.  #Hyperkalemia: Serum potassium level improved.   #Anemia, multifactorial, ABLA due to vagina bleed: Iron saturation 38%, hemoglobin >7. Received aranesp on 11/4.  Plan for blood transfusion by ICU team today.  #Cardiac arrest status post cardiac cath, mid RCA 55% not enough to explain PEA arrest per cardiology.     Subjective: Seen and examined at bedside.  Patient is on trach and vent.  Following simple commands.  Denies chest pain or shortness of breath.  Objective Vital signs in last 24 hours: Vitals:   10/25/18 0600 10/25/18 0700 10/25/18 0800 10/25/18 0801  BP: (!) 102/51 (!) 97/50 (!) (P) 100/46   Pulse: 89 87    Resp: (!) 28 (!) 23    Temp:    98.9 F (37.2 C)  TempSrc:    Oral  SpO2: 100% 100%    Weight:      Height:       Weight change: 0  kg  Intake/Output Summary (Last 24 hours) at 10/25/2018 0927 Last data filed at 10/24/2018 2345 Gross per 24 hour  Intake 505.26 ml  Output -  Net 505.26 ml       Labs: Basic Metabolic Panel: Recent Labs  Lab 10/23/18 1552 10/24/18 0304 10/25/18 0416  NA 136 135 136  K 4.6 4.6 4.6  CL 104 102 103  CO2 22 24 23   GLUCOSE 159* 141* 125*  BUN 65* 44* 56*  CREATININE 3.61* 2.66* 3.99*  CALCIUM 8.4* 9.5 10.1  PHOS 3.2 3.2 5.1*   Liver Function Tests: Recent Labs  Lab 10/19/18 1555  10/23/18 1552 10/24/18 0304 10/25/18 0416  AST 72*  --   --   --   --   ALT 70*  --   --   --   --   ALKPHOS 200*  --   --   --   --   BILITOT 0.5  --   --   --   --   PROT 7.2  --   --   --   --   ALBUMIN 2.0*   < > 2.0* 2.1* 1.9*   < > = values in this interval not displayed.   No results for input(s): LIPASE, AMYLASE in the last 168 hours. No results for input(s): AMMONIA in the last 168 hours. CBC: Recent Labs  Lab 10/20/18 0433 10/21/18 0450 10/23/18 0413 10/24/18 0244 10/25/18 0640  WBC 22.9* 18.5*  15.6* 18.2* 16.8*  NEUTROABS  --   --   --  13.1*  --   HGB 8.1* 7.4* 7.1* 7.2* 6.5*  HCT 28.9* 26.4* 25.5* 25.9* 24.3*  MCV 88.9 88.0 89.2 90.6 92.4  PLT 251 271 313 341 334   Cardiac Enzymes: No results for input(s): CKTOTAL, CKMB, CKMBINDEX, TROPONINI in the last 168 hours. CBG: Recent Labs  Lab 10/24/18 1541 10/24/18 2013 10/24/18 2347 10/25/18 0439 10/25/18 0756  GLUCAP 160* 102* 135* 116* 117*    Iron Studies: No results for input(s): IRON, TIBC, TRANSFERRIN, FERRITIN in the last 72 hours. Studies/Results: No results found.  Medications: Infusions: . sodium chloride Stopped (10/24/18 0739)  . dexmedetomidine (PRECEDEX) IV infusion Stopped (10/24/18 0823)  . feeding supplement (VITAL HIGH PROTEIN) Stopped (10/24/18 2345)  . norepinephrine (LEVOPHED) Adult infusion Stopped (10/24/18 0601)    Scheduled Medications: . sodium chloride   Intravenous Once  .  aspirin  81 mg Oral Daily  . ceFEPime (MAXIPIME) IV  500 mg Intravenous Q24H  . chlorhexidine gluconate (MEDLINE KIT)  15 mL Mouth Rinse BID  . insulin aspart  0-20 Units Subcutaneous Q4H  . insulin aspart  6 Units Subcutaneous Q6H  . insulin glargine  30 Units Subcutaneous Daily  . ipratropium-albuterol  3 mL Nebulization Q6H  . mouth rinse  15 mL Mouth Rinse 10 times per day  . multivitamin  15 mL Per Tube Daily  . pantoprazole sodium  40 mg Per Tube Daily  . rosuvastatin  10 mg Per Tube QPM    have reviewed scheduled and prn medications.  Physical Exam: General: Trach and on vent, following simple commands Heart:RRR, s1s2 nl Lungs: Coarse breath sound bilateral, no wheezing Abdomen:soft, Non-tender, non-distended Extremities: No edema Dialysis Access: Catheter removed.  Diva Lemberger Prasad Quaid Yeakle 10/25/2018,9:27 AM  LOS: 18 days

## 2018-10-25 NOTE — Evaluation (Signed)
Occupational Therapy Evaluation Patient Details Name: Catherine Williams MRN: [id number] DOB: 1961-12-02 Today's Date: 10/25/2018    History of Present Illness Pt is a 57 y.o. F with significant PMH of obesity with OSA and CKD stage V admitted on 10/20 after PEA cardiac arrest and intubated. Underwent hypothermia protocol and started on CRRT. 10/26 sustained another brief PEA arrest and underwent bronchoscopy. Had 2 more arrests on 10/30 and 11/1 post tracheostomy placement, cardiac cath negative for obstructive CAD. Also has sacral stage 2 ulcer.   Clinical Impression   Pt was ambulating with a cane and assisted for IADL and ADL as needed prior to admission. Pt evaluated while on full vent support via tracheostomy. Presents with significant weakness, decreased activity tolerance and poor sitting balance. She is dependent in all ADL and requires 2 person assist for bed level mobility. Pt will need extensive rehab upon discharge. Recommending LTACH. Will follow acutely.    Follow Up Recommendations  LTACH;Supervision/Assistance - 24 hour    Equipment Recommendations       Recommendations for Other Services       Precautions / Restrictions Precautions Precautions: Fall Precaution Comments: Vent Restrictions Weight Bearing Restrictions: No      Mobility Bed Mobility   Bed Mobility: Rolling;Sidelying to Sit;Sit to Supine Rolling: +2 for physical assistance;Total assist Sidelying to sit: +2 for physical assistance;Total assist   Sit to supine: +2 for safety/equipment;Total assist   General bed mobility comments: assist for all aspects, minimal ability to participate  Transfers                 General transfer comment: deferred    Balance Overall balance assessment: Needs assistance Sitting-balance support: Bilateral upper extremity supported;Feet supported Sitting balance-Leahy Scale: Poor Sitting balance - Comments: max assist initially, progressed to min guard for up  to 2 minutes. Postural control: Posterior lean                                 ADL either performed or assessed with clinical judgement   ADL                                         General ADL Comments: Requires total assist, can bring R hand to face.     Vision Patient Visual Report: No change from baseline       Perception     Praxis      Pertinent Vitals/Pain Pain Assessment: Faces Faces Pain Scale: Hurts even more Pain Location: neck with gentle ROM Pain Descriptors / Indicators: Grimacing Pain Intervention(s): Monitored during session;Repositioned     Hand Dominance Right   Extremity/Trunk Assessment Upper Extremity Assessment Upper Extremity Assessment: Generalized weakness RUE Deficits / Details: grossly 2 to 2+/5 LUE Deficits / Details: grossly 2/5   Lower Extremity Assessment Lower Extremity Assessment: Defer to PT evaluation   Cervical / Trunk Assessment Cervical / Trunk Assessment: Other exceptions Cervical / Trunk Exceptions: increased body habitus, significant weakness   Communication Communication Communication: Tracheostomy   Cognition Arousal/Alertness: Lethargic Behavior During Therapy: Flat affect(will smile when prompted) Overall Cognitive Status: Difficult to assess                                     General  Comments       Exercises     Shoulder Instructions      Home Living Family/patient expects to be discharged to:: Unsure                                        Prior Functioning/Environment Level of Independence: Needs assistance  Gait / Transfers Assistance Needed: ambulates with SPC PRN ADL's / Homemaking Assistance Needed: Family does grocery shopping and assists with BADL as needed   Comments: PLOF obtained from previous inpatient admission stay        OT Problem List: Decreased strength;Decreased activity tolerance;Impaired balance (sitting and/or  standing);Decreased coordination;Decreased knowledge of use of DME or AE;Impaired UE functional use;Obesity;Cardiopulmonary status limiting activity      OT Treatment/Interventions: Self-care/ADL training;DME and/or AE instruction;Patient/family education;Balance training;Therapeutic exercise    OT Goals(Current goals can be found in the care plan section) Acute Rehab OT Goals Patient Stated Goal: "go to [employer]." OT Goal Formulation: Patient unable to participate in goal setting Time For Goal Achievement: 11/08/18 Potential to Achieve Goals: Fair ADL Goals Pt Will Perform Grooming: with mod assist;sitting Pt Will Perform Upper Body Dressing: with mod assist;sitting Pt Will Transfer to Toilet: with +2 assist;with mod assist;stand pivot transfer;bedside commode Pt/caregiver will Perform Home Exercise Program: Increased strength;Both right and left upper extremity(A/AROM) Additional ADL Goal #1: Pt will perform bed mobility with +2 moderate assistance in preparation for ADL.  OT Frequency: Min 2X/week   Barriers to D/C:            Co-evaluation PT/OT/SLP Co-Evaluation/Treatment: Yes Reason for Co-Treatment: For patient/therapist safety;Complexity of the patient's impairments (multi-system involvement)   OT goals addressed during session: Strengthening/ROM      AM-PAC PT "6 Clicks" Daily Activity     Outcome Measure Help from another person eating meals?: Total Help from another person taking care of personal grooming?: Total Help from another person toileting, which includes using toliet, bedpan, or urinal?: Total Help from another person bathing (including washing, rinsing, drying)?: Total Help from another person to put on and taking off regular upper body clothing?: Total Help from another person to put on and taking off regular lower body clothing?: Total 6 Click Score: 6   End of Session Nurse Communication: Mobility status  Activity Tolerance: Patient limited by  fatigue(on full vent support) Patient left: in bed;with call bell/phone within reach  OT Visit Diagnosis: Pain;Muscle weakness (generalized) (M62.81)                Time: 1421-1501 OT Time Calculation (min): 40 min Charges:  OT General Charges $OT Visit: 1 Visit OT Evaluation $OT Eval High Complexity: 1 High OT Treatments $Therapeutic Activity: 8-22 mins  Malka So 10/25/2018, 3:38 PM  Nestor Lewandowsky, OTR/L Acute Rehabilitation Services Pager: [phone] Office: [phone]

## 2018-10-25 NOTE — Progress Notes (Signed)
CRITICAL VALUE ALERT  Critical Value: Hgb 6.5  Date & Time Notied: 10/25/18 @ 0720  Provider Notified: Dr. Elsworth Soho, CCM  Orders Received/Actions taken: Orders received to transfuse 1 unit PRBCs

## 2018-10-26 ENCOUNTER — Inpatient Hospital Stay (HOSPITAL_COMMUNITY): Payer: Medicare Other

## 2018-10-26 ENCOUNTER — Encounter (HOSPITAL_COMMUNITY): Payer: Self-pay | Admitting: Interventional Radiology

## 2018-10-26 DIAGNOSIS — I472 Ventricular tachycardia: Secondary | ICD-10-CM

## 2018-10-26 HISTORY — PX: IR FLUORO GUIDE CV LINE LEFT: IMG2282

## 2018-10-26 HISTORY — PX: IR US GUIDE VASC ACCESS LEFT: IMG2389

## 2018-10-26 LAB — CBC
HEMATOCRIT: 25.7 % — AB (ref 36.0–46.0)
Hemoglobin: 7 g/dL — ABNORMAL LOW (ref 12.0–15.0)
MCH: 24.6 pg — ABNORMAL LOW (ref 26.0–34.0)
MCHC: 27.2 g/dL — AB (ref 30.0–36.0)
MCV: 90.2 fL (ref 80.0–100.0)
Platelets: 361 10*3/uL (ref 150–400)
RBC: 2.85 MIL/uL — ABNORMAL LOW (ref 3.87–5.11)
RDW: 17.5 % — AB (ref 11.5–15.5)
WBC: 12.5 10*3/uL — AB (ref 4.0–10.5)
nRBC: 1.8 % — ABNORMAL HIGH (ref 0.0–0.2)

## 2018-10-26 LAB — PROCALCITONIN: Procalcitonin: 1.77 ng/mL

## 2018-10-26 LAB — GLUCOSE, CAPILLARY
GLUCOSE-CAPILLARY: 85 mg/dL (ref 70–99)
Glucose-Capillary: 96 mg/dL (ref 70–99)
Glucose-Capillary: 107 mg/dL — ABNORMAL HIGH (ref 70–99)
Glucose-Capillary: 118 mg/dL — ABNORMAL HIGH (ref 70–99)
Glucose-Capillary: 121 mg/dL — ABNORMAL HIGH (ref 70–99)
Glucose-Capillary: 104 mg/dL — ABNORMAL HIGH (ref 70–99)

## 2018-10-26 LAB — TYPE AND SCREEN
ABO/RH(D): O POS
Antibody Screen: NEGATIVE
Unit division: 0

## 2018-10-26 LAB — RENAL FUNCTION PANEL
ANION GAP: 15 (ref 5–15)
Albumin: 1.9 g/dL — ABNORMAL LOW (ref 3.5–5.0)
BUN: 66 mg/dL — ABNORMAL HIGH (ref 6–20)
CALCIUM: 9.9 mg/dL (ref 8.9–10.3)
CHLORIDE: 100 mmol/L (ref 98–111)
CO2: 21 mmol/L — ABNORMAL LOW (ref 22–32)
Creatinine, Ser: 5.48 mg/dL — ABNORMAL HIGH (ref 0.44–1.00)
GFR calc Af Amer: 9 mL/min — ABNORMAL LOW (ref >60)
GFR calc non Af Amer: 8 mL/min — ABNORMAL LOW (ref >60)
GLUCOSE: 105 mg/dL — AB (ref 70–99)
PHOSPHORUS: 6.4 mg/dL — AB (ref 2.5–4.6)
POTASSIUM: 4.7 mmol/L (ref 3.5–5.1)
SODIUM: 136 mmol/L (ref 135–145)

## 2018-10-26 LAB — BPAM RBC
BLOOD PRODUCT EXPIRATION DATE: 201912012359
ISSUE DATE / TIME: 201911071111
UNIT TYPE AND RH: 5100

## 2018-10-26 LAB — MAGNESIUM: Magnesium: 2.7 mg/dL — ABNORMAL HIGH (ref 1.7–2.4)

## 2018-10-26 MED ORDER — LIDOCAINE HCL 1 % IJ SOLN
INTRAMUSCULAR | Status: AC
  Filled 2018-10-26: qty 20

## 2018-10-26 MED ORDER — MIDAZOLAM HCL 2 MG/2ML IJ SOLN
INTRAMUSCULAR | Status: AC | PRN
  Administered 2018-10-26 (×2): 0.5 mg via INTRAVENOUS

## 2018-10-26 MED ORDER — HEPARIN SODIUM (PORCINE) 1000 UNIT/ML IJ SOLN
INTRAMUSCULAR | Status: AC
  Filled 2018-10-26: qty 1

## 2018-10-26 MED ORDER — FENTANYL CITRATE (PF) 100 MCG/2ML IJ SOLN
INTRAMUSCULAR | Status: AC
  Filled 2018-10-26: qty 2

## 2018-10-26 MED ORDER — MIDODRINE HCL 5 MG PO TABS
10.0000 mg | ORAL_TABLET | Freq: Once | ORAL | Status: AC
  Administered 2018-10-26: 10 mg via ORAL
  Filled 2018-10-26: qty 2

## 2018-10-26 MED ORDER — MIDAZOLAM HCL 2 MG/2ML IJ SOLN
INTRAMUSCULAR | Status: AC
  Filled 2018-10-26: qty 2

## 2018-10-26 MED ORDER — GELATIN ABSORBABLE 12-7 MM EX MISC
CUTANEOUS | Status: AC
  Filled 2018-10-26: qty 1

## 2018-10-26 MED ORDER — DEXTROSE 5 % IV SOLN
500.0000 mg | INTRAVENOUS | Status: AC
  Administered 2018-10-27 – 2018-10-28 (×2): 500 mg via INTRAVENOUS
  Filled 2018-10-26 (×3): qty 0.5

## 2018-10-26 MED ORDER — CHLORHEXIDINE GLUCONATE CLOTH 2 % EX PADS
6.0000 | MEDICATED_PAD | Freq: Every day | CUTANEOUS | Status: DC
  Administered 2018-10-26 – 2018-10-28 (×3): 6 via TOPICAL

## 2018-10-26 MED ORDER — FENTANYL CITRATE (PF) 100 MCG/2ML IJ SOLN
INTRAMUSCULAR | Status: AC | PRN
  Administered 2018-10-26 (×2): 25 ug via INTRAVENOUS

## 2018-10-26 MED ORDER — HEPARIN SODIUM (PORCINE) 5000 UNIT/ML IJ SOLN
5000.0000 [IU] | Freq: Three times a day (TID) | INTRAMUSCULAR | Status: DC
  Administered 2018-10-27 – 2018-11-01 (×17): 5000 [IU] via SUBCUTANEOUS
  Filled 2018-10-26 (×17): qty 1

## 2018-10-26 MED ORDER — HEPARIN SODIUM (PORCINE) 1000 UNIT/ML IJ SOLN
4.2000 mL | Freq: Once | INTRAMUSCULAR | Status: AC
  Administered 2018-10-29: 4200 [IU]
  Filled 2018-10-26: qty 4.2
  Filled 2018-10-26: qty 5

## 2018-10-26 MED ORDER — METOPROLOL TARTRATE 12.5 MG HALF TABLET
12.5000 mg | ORAL_TABLET | Freq: Two times a day (BID) | ORAL | Status: DC
  Administered 2018-10-26 – 2018-10-29 (×5): 12.5 mg via NASOGASTRIC
  Filled 2018-10-26 (×6): qty 1

## 2018-10-26 MED ORDER — LIDOCAINE HCL (PF) 1 % IJ SOLN
INTRAMUSCULAR | Status: AC | PRN
  Administered 2018-10-26: 10 mL

## 2018-10-26 NOTE — Telephone Encounter (Signed)
LVM for Ulysees Barns [phone] regarding pt's FMLA paperwork. X1 LVM for pt to return call regarding below message. X1

## 2018-10-26 NOTE — Progress Notes (Signed)
Pt still with moderate vaginal bleeding. Pericare preformed. Will pass along to pm shift.

## 2018-10-26 NOTE — Sedation Documentation (Signed)
Patient is resting comfortably. 

## 2018-10-26 NOTE — Sedation Documentation (Signed)
Patient is resting comfortably. 

## 2018-10-26 NOTE — Progress Notes (Signed)
57 year old obese woman with OSA and CKD stage V admitted 10/20 after PEA cardiac arrest. Underwent hypothermia protocol andstarted on CRRT  10/26sustained another brief PEA arrest related to mucous plugging,underwent bronchoscopy She had 2 more arrest on 10/30 again attributed to mucous plugging and on 11/1 post tracheostomy placement, cardiac cath was negative for any obstructive CAD. EP was consulted  CRRT was stopped 11/6, low-dose metoprolol started by cardiology.  On exam-appears weak and deconditioned, soft obese abdomen, awake follows commands, S1-S2 distant, 1+ edema.  Labs show BUN and creatinine rising off dialysis, procalcitonin decreasing and WBC count decreasing.Hemoglobin at 7.  CT angiogram personally reviewed- does not show pulmonary embolus, bilateral lower lobe atelectasis.  Impression/plan  Acute respiratory failure/tracheostomy-try to lower pressure support on spontaneous breathing trials with goal of trach collar.  AKI on CKD stage V-hemodialysis dependent, permacath to be placed, then would like to see if she tolerates regular dialysis.  Serratia UTI-continue cefepime until procalcitonin drops less than 1, and another 2 days  Recurrent cardiac arrest/PEA-no cause defined, no obstructive CAD, no indication for PPM. Appreciate cardiology input, low-dose metoprolol added  She needs aggressive rehab and weaning, has been accepted to select LTAC, would like to trial 1 session of intermittent dialysis before transfer  The patient is critically ill with multiple organ systems failure and requires high complexity decision making for assessment and support, frequent evaluation and titration of therapies, application of advanced monitoring technologies and extensive interpretation of multiple databases. Critical Care Time devoted to patient care services described in this note independent of APP/resident  time is 31 minutes.    Leanna Sato Elsworth Soho MD

## 2018-10-26 NOTE — Progress Notes (Signed)
Progress Note  Patient Name: Catherine Williams Date of Encounter: 10/26/2018  Primary Cardiologist: Dr Burt Knack  Subjective   Alert; no CP or dyspnea  Inpatient Medications    Scheduled Meds: . aspirin  81 mg Oral Daily  . ceFEPime (MAXIPIME) IV  500 mg Intravenous Q24H  . chlorhexidine gluconate (MEDLINE KIT)  15 mL Mouth Rinse BID  . [START ON 10/27/2018] heparin injection (subcutaneous)  5,000 Units Subcutaneous Q8H  . insulin aspart  0-20 Units Subcutaneous Q4H  . insulin aspart  6 Units Subcutaneous Q6H  . insulin glargine  30 Units Subcutaneous Daily  . ipratropium-albuterol  3 mL Nebulization Q6H  . mouth rinse  15 mL Mouth Rinse 10 times per day  . multivitamin  15 mL Per Tube Daily  . pantoprazole sodium  40 mg Per Tube Daily  . rosuvastatin  10 mg Per Tube QPM   Continuous Infusions: . sodium chloride 10 mL/hr at 10/25/18 0730  . feeding supplement (VITAL HIGH PROTEIN) 1,000 mL (10/25/18 1756)   PRN Meds: sodium chloride, acetaminophen (TYLENOL) oral liquid 160 mg/5 mL, bisacodyl, fentaNYL (SUBLIMAZE) injection, ondansetron (ZOFRAN) IV   Vital Signs    Vitals:   10/26/18 0400 10/26/18 0500 10/26/18 0600 10/26/18 0700  BP: (!) 106/49 (!) 99/48 (!) 108/51 (!) 117/47  Pulse: 85 86 84 84  Resp: (!) 26 11 12  (!) 21  Temp: 98.5 F (36.9 C)     TempSrc: Axillary     SpO2: 100% 94% 92% 90%  Weight:  136.1 kg    Height:        Intake/Output Summary (Last 24 hours) at 10/26/2018 0733 Last data filed at 10/26/2018 0400 Gross per 24 hour  Intake 512.67 ml  Output 0 ml  Net 512.67 ml   Filed Weights   10/24/18 0409 10/25/18 0417 10/26/18 0500  Weight: 134.3 kg 134.3 kg 136.1 kg    Telemetry    Sinus with 3 beats NSVT - Personally Reviewed  Physical Exam   GEN: No acute distress.  Morbidly obese Neck: Trach in place Cardiac: RRR, no murmur Respiratory: CTA anteriorly; no wheeze GI: Soft, NT, no masses MS: No edema Neuro: shakes head appropriately to  questions  Labs    Chemistry Recent Labs  Lab 10/19/18 1555  10/24/18 0304 10/25/18 0416 10/26/18 0506  NA 134*   < > 135 136 136  K 3.9   < > 4.6 4.6 4.7  CL 99   < > 102 103 100  CO2 21*   < > 24 23 21*  GLUCOSE 342*   < > 141* 125* 105*  BUN 108*   < > 44* 56* 66*  CREATININE 5.73*   < > 2.66* 3.99* 5.48*  CALCIUM 10.3   < > 9.5 10.1 9.9  PROT 7.2  --   --   --   --   ALBUMIN 2.0*   < > 2.1* 1.9* 1.9*  AST 72*  --   --   --   --   ALT 70*  --   --   --   --   ALKPHOS 200*  --   --   --   --   BILITOT 0.5  --   --   --   --   GFRNONAA 7*   < > 19* 11* 8*  GFRAA 8*   < > 21* 13* 9*  ANIONGAP 14   < > 9 10 15    < > = values  in this interval not displayed.     Hematology Recent Labs  Lab 10/24/18 0244 10/25/18 0640 10/26/18 0505  WBC 18.2* 16.8* 12.5*  RBC 2.86* 2.63* 2.85*  HGB 7.2* 6.5* 7.0*  HCT 25.9* 24.3* 25.7*  MCV 90.6 92.4 90.2  MCH 25.2* 24.7* 24.6*  MCHC 27.8* 26.7* 27.2*  RDW 16.1* 16.4* 17.5*  PLT 341 334 361    Patient Profile     57 y.o. female with obstructive sleep apnea, chronic kidney disease, morbid obesity, hypertension, diabetes mellitus admitted following PEA cardiac arrest.  She has had subsequent PEA cardiac arrests in hospital felt to be related to respiratory distress and mucus plugging.  Echocardiogram October 21 showed normal LV function and moderate diastolic dysfunction.  Cardiac catheterization November 2019 showed 55% proximal to mid right coronary artery and 30% mid LAD.  Mildly elevated left ventricular end-diastolic pressure.  Assessment & Plan    1 status post cardiac arrest-as outlined in previous note patient has had recurrent PEA arrest.  This is likely secondary to respiratory distress/mucous plugging with contribution from metabolic abnormalities.  CTA not suggestive of pulmonary embolus.  Echocardiogram showed normal LV function and no pericardial effusion.  Cardiac catheterization revealed moderate nonobstructive coronary  disease.  No plans for further cardiac evaluation.   2 acute on chronic stage V kidney disease-dialysis per nephrology  3 respiratory failure-trach in place; per CCM.  4 NSVT-we will add low-dose metoprolol and follow blood pressure closely.  If she has more frequent episodes in the future could add amiodarone.    Would arrange follow-up with Dr. Burt Knack when she recovers from present illness.   For questions or updates, please contact [place] Please consult [email or web address] for contact info under        Signed, Kirk Ruths, MD  10/26/2018, 7:33 AM

## 2018-10-26 NOTE — Progress Notes (Signed)
[facility] NEPHROLOGY PROGRESS NOTE  Assessment/ Plan: Pt is a 57 y.o. yo female with advanced CKD status post AV fistula creation, admitted with cardiac arrest and acute on chronic renal failure.  #Acute on advance CKD after cardiac arrest:  She remains anuric.  She required CRRT from 10/25-10/30 (AM).  Patient did not tolerate intermittent hemodialysis on 11/1, only 15 minutes and went into PEA arrest x2.  Patient underwent emergent cardiac cath with no PCI. -Restarted CRRT on 10/22/2018 to 10/24/18.  -The temporary dialysis catheter was removed on 11/6 because of leukocytosis.  Patient is now afebrile and leukocytosis improving.  Plan for tunneled catheter placement by IR today.  We will do intermittent hemodialysis in ICU today.  Use midodrine for hypotension. -She has left AV fistula maturing.  #Hypotension: Blood pressure is improving off pressors.  Continue to monitor.  She is off pressors.  #Hyperkalemia: Serum potassium level improved.   #Anemia, multifactorial, ABLA due to vagina bleed: Iron saturation 38%, hemoglobin >7. Received aranesp on 11/4.  Received blood transfusion yesterday.  #Cardiac arrest status post cardiac cath, mid RCA 55% not enough to explain PEA arrest per cardiology.  Started metoprolol by cardiology.   Subjective: Seen and examined at bedside.  She was on trach, following simple commands.  Review of systems limited. Objective Vital signs in last 24 hours: Vitals:   10/26/18 0700 10/26/18 0751 10/26/18 0757 10/26/18 0758  BP: (!) 117/47     Pulse: 84     Resp: (!) 21     Temp:    98.5 F (36.9 C)  TempSrc:      SpO2: 90% 99% 98%   Weight:      Height:       Weight change: 1.814 kg  Intake/Output Summary (Last 24 hours) at 10/26/2018 0906 Last data filed at 10/26/2018 0400 Gross per 24 hour  Intake 512.67 ml  Output 0 ml  Net 512.67 ml       Labs: Basic Metabolic Panel: Recent Labs  Lab 10/24/18 0304 10/25/18 0416  10/26/18 0506  NA 135 136 136  K 4.6 4.6 4.7  CL 102 103 100  CO2 24 23 21*  GLUCOSE 141* 125* 105*  BUN 44* 56* 66*  CREATININE 2.66* 3.99* 5.48*  CALCIUM 9.5 10.1 9.9  PHOS 3.2 5.1* 6.4*   Liver Function Tests: Recent Labs  Lab 10/19/18 1555  10/24/18 0304 10/25/18 0416 10/26/18 0506  AST 72*  --   --   --   --   ALT 70*  --   --   --   --   ALKPHOS 200*  --   --   --   --   BILITOT 0.5  --   --   --   --   PROT 7.2  --   --   --   --   ALBUMIN 2.0*   < > 2.1* 1.9* 1.9*   < > = values in this interval not displayed.   No results for input(s): LIPASE, AMYLASE in the last 168 hours. No results for input(s): AMMONIA in the last 168 hours. CBC: Recent Labs  Lab 10/21/18 0450 10/23/18 0413 10/24/18 0244 10/25/18 0640 10/26/18 0505  WBC 18.5* 15.6* 18.2* 16.8* 12.5*  NEUTROABS  --   --  13.1*  --   --   HGB 7.4* 7.1* 7.2* 6.5* 7.0*  HCT 26.4* 25.5* 25.9* 24.3* 25.7*  MCV 88.0 89.2 90.6 92.4 90.2  PLT 271 313 341 334  361   Cardiac Enzymes: No results for input(s): CKTOTAL, CKMB, CKMBINDEX, TROPONINI in the last 168 hours. CBG: Recent Labs  Lab 10/25/18 1610 10/25/18 1952 10/25/18 2215 10/26/18 0335 10/26/18 0755  GLUCAP 115* 107* 120* 96 107*    Iron Studies: No results for input(s): IRON, TIBC, TRANSFERRIN, FERRITIN in the last 72 hours. Studies/Results: Ct Angio Chest Pe W Or Wo Contrast  Result Date: 10/25/2018 CLINICAL DATA:  Followup for cardiac arrest. Patient with a tracheostomy tube. EXAM: CT ANGIOGRAPHY CHEST WITH CONTRAST TECHNIQUE: Multidetector CT imaging of the chest was performed using the standard protocol during bolus administration of intravenous contrast. Multiplanar CT image reconstructions and MIPs were obtained to evaluate the vascular anatomy. CONTRAST:  53m ISOVUE-370 IOPAMIDOL (ISOVUE-370) INJECTION 76% COMPARISON:  Chest radiograph, 10/23/2018 and older exams. FINDINGS: Cardiovascular: The opacification of the pulmonary arteries beyond  the branch points into the segmental pulmonary arteries is suboptimal. Pulmonary emboli cannot be excluded in the segmental and smaller branches. Allowing for this, there is no evidence of a large, central pulmonary embolus. Heart is mildly enlarged. No pericardial effusion. There are coronary artery calcifications. No aortic dissection. No aortic atherosclerosis. Mediastinum/Nodes: Well-positioned tracheostomy tube. Nasal/orogastric tube passes into the stomach. No neck base, axillary, mediastinal or hilar masses or enlarged lymph nodes. Trachea is patent. Lungs/Pleura: There is dependent lower lobe atelectasis. Mild bilateral hazy ground-glass opacities are noted most evident in the upper lobes. This is likely due to the expiratory nature of fell to air trapping. Bilateral infection/inflammation is not excluded but felt less likely. 8 x 6 mm, mean 7 mm, pleural-based left upper lobe nodule, image 24, series 9. No other nodules. No pleural effusion.  No pneumothorax. Upper Abdomen: No acute abnormality. Musculoskeletal: No fracture or acute finding. There are degenerative changes along the thoracic spine. There are ill-defined areas of relative lucency, with multiple Schmorl's nodes. No definite lytic bone lesion. Review of the MIP images confirms the above findings. IMPRESSION: 1. Limited exam. Segmental and smaller pulmonary arteries not assessed for pulmonary emboli. Allowing for this, there is no large/central pulmonary embolus. 2. Dependent atelectasis in the lower lobes. Patchy areas of ground-glass opacity most evident in the upper lobes, most likely shunting related small airways disease in this expiratory study. Consider infection/inflammation if there are consistent clinical findings. No evidence of pulmonary edema. Electronically Signed   By: DLajean ManesM.D.   On: 10/25/2018 18:10    Medications: Infusions: . sodium chloride 10 mL/hr at 10/25/18 0730  . feeding supplement (VITAL HIGH PROTEIN)  1,000 mL (10/25/18 1756)    Scheduled Medications: . aspirin  81 mg Oral Daily  . ceFEPime (MAXIPIME) IV  500 mg Intravenous Q24H  . chlorhexidine gluconate (MEDLINE KIT)  15 mL Mouth Rinse BID  . Chlorhexidine Gluconate Cloth  6 each Topical Q0600  . [START ON 10/27/2018] heparin injection (subcutaneous)  5,000 Units Subcutaneous Q8H  . insulin aspart  0-20 Units Subcutaneous Q4H  . insulin aspart  6 Units Subcutaneous Q6H  . insulin glargine  30 Units Subcutaneous Daily  . ipratropium-albuterol  3 mL Nebulization Q6H  . mouth rinse  15 mL Mouth Rinse 10 times per day  . metoprolol tartrate  12.5 mg Per NG tube BID  . multivitamin  15 mL Per Tube Daily  . pantoprazole sodium  40 mg Per Tube Daily  . rosuvastatin  10 mg Per Tube QPM    have reviewed scheduled and prn medications.  Physical Exam: General: Trach and on vent,  following simple commands Heart:RRR, s1s2 nl Lungs: Worse breath sound bilateral, no wheezing Abdomen:soft, Non-tender, non-distended Extremities: No edema Dialysis Access: Catheter removed.   Prasad  10/26/2018,9:06 AM  LOS: 19 days

## 2018-10-27 LAB — RENAL FUNCTION PANEL
ANION GAP: 12 (ref 5–15)
Albumin: 1.9 g/dL — ABNORMAL LOW (ref 3.5–5.0)
BUN: 40 mg/dL — AB (ref 6–20)
CO2: 24 mmol/L (ref 22–32)
Calcium: 9 mg/dL (ref 8.9–10.3)
Chloride: 99 mmol/L (ref 98–111)
Creatinine, Ser: 4.07 mg/dL — ABNORMAL HIGH (ref 0.44–1.00)
GFR, EST AFRICAN AMERICAN: 13 mL/min — AB (ref >60)
GFR, EST NON AFRICAN AMERICAN: 11 mL/min — AB (ref >60)
Glucose, Bld: 129 mg/dL — ABNORMAL HIGH (ref 70–99)
PHOSPHORUS: 4.8 mg/dL — AB (ref 2.5–4.6)
Potassium: 4 mmol/L (ref 3.5–5.1)
SODIUM: 135 mmol/L (ref 135–145)

## 2018-10-27 LAB — GLUCOSE, CAPILLARY
GLUCOSE-CAPILLARY: 127 mg/dL — AB (ref 70–99)
GLUCOSE-CAPILLARY: 126 mg/dL — AB (ref 70–99)
Glucose-Capillary: 109 mg/dL — ABNORMAL HIGH (ref 70–99)
Glucose-Capillary: 123 mg/dL — ABNORMAL HIGH (ref 70–99)
Glucose-Capillary: 123 mg/dL — ABNORMAL HIGH (ref 70–99)
Glucose-Capillary: 131 mg/dL — ABNORMAL HIGH (ref 70–99)

## 2018-10-27 LAB — HEPATITIS B SURFACE ANTIGEN: Hepatitis B Surface Ag: NEGATIVE

## 2018-10-27 LAB — MAGNESIUM: MAGNESIUM: 2.4 mg/dL (ref 1.7–2.4)

## 2018-10-27 LAB — HEPATITIS B CORE ANTIBODY, TOTAL: Hep B Core Total Ab: NEGATIVE

## 2018-10-27 LAB — APTT: APTT: 35 s (ref 24–36)

## 2018-10-27 MED ORDER — PRO-STAT SUGAR FREE PO LIQD
30.0000 mL | Freq: Three times a day (TID) | ORAL | Status: DC
  Administered 2018-10-27 – 2018-11-01 (×15): 30 mL
  Filled 2018-10-27 (×15): qty 30

## 2018-10-27 NOTE — Progress Notes (Signed)
   Telemetry reviewed. Rare episodes of brief 3-4 beats NSVT. Stable.   Keep K > 4.0. MG > 2.0. No role for amio currently.   We will sign off. Please call with questions.   Arrange f/u with Dr. Burt Knack on d/c.   Glori Bickers, MD  9:08 AM

## 2018-10-27 NOTE — Progress Notes (Signed)
Nutrition Follow-up  DOCUMENTATION CODES:   Morbid obesity  INTERVENTION:   Continue tube feeding via NG tube: - Vital High Protein @ 40 ml/hr (960 ml/day) - Will reorder Pro-stat 30 ml TID - Liquid MVI daily  Tube feeding regimen provides 1260 kcal, 129 grams of protein, and 802 ml of H2O (100% of needs).  If pt to remain on tube feeding, recommend switching NG tube to Cortrak for pt comfort.  NUTRITION DIAGNOSIS:   Inadequate oral intake related to inability to eat as evidenced by NPO status.  Ongoing  GOAL:   Provide needs based on ASPEN/SCCM guidelines  Met  MONITOR:   TF tolerance, Vent status, Labs, Skin, Weight trends, I & O's  REASON FOR ASSESSMENT:   Consult Enteral/tube feeding initiation and management, Assessment of nutrition requirement/status  ASSESSMENT:   57 yo female who presented to ED s/p witnessed arrest. Upon EMS's arrival patient was noted to be pulseless. ROSC achieved at 1900 after 2 shocks and CPR. On arrival to ED patient remained unresponsive.  10/25-10/30 - CVVHD 11/1 - trach placed 11/4-11/6 - CVVHD 11/8 - s/p IR placement of tunneled cath, iHD w/ net UF 2 L  Pt with several PEA arrests since admission, suspected secondary to respiratory distress/mucous plugging.  Per CCM note, goal is for trach collar.  Per Nephrology note, plan for next iHD on Monday.  Discussed pt with RN. Per RN, pt weaned for 4 hours on Thursday and has been weaning for 1 hour today. RN unsure if pt will be weaned completely from the vent today. Will follow up and adjust tube feeding as needed once pt weaned.  Noted 19 lb weight loss since admission. Suspect this is related to negative fluid balance and CVVHD.  Patient is on ventilator support via trach. MVe: 8.3 L/min Temp (24hrs), Avg:99.2 F (37.3 C), Min:98.6 F (37 C), Max:100.5 F (38.1 C) BP: 97/54 MAP: 67  Current tube feeding: Vital High Protein @ 40 ml/hr (infusing via pump at time of  visit), noted Pro-stat has been d/c - will reorder  Medications reviewed and include: SSI, Lantus, liquid MVI, Protonix  Labs reviewed: phosphorus 4.8 (H) CBG's: 123, 127, 109, 85, 104, 118 x 24 hours  I/O's: -4.7 L since admit  Diet Order:   Diet Order            Diet NPO time specified Except for: Sips with Meds  Diet effective midnight              EDUCATION NEEDS:   No education needs have been identified at this time  Skin:  Skin Assessment: Skin Integrity Issues: Stage II: neck, sacrum, back Incisions: chest, left arm  Last BM:  11/7 (smear type 5)  Height:   Ht Readings from Last 1 Encounters:  10/07/18 5' 2"  (1.575 m)    Weight:   Wt Readings from Last 1 Encounters:  10/27/18 134.3 kg    Ideal Body Weight:  50 kg  BMI:  Body mass index is 54.15 kg/m.  Estimated Nutritional Needs:   Kcal:  1100-1250   Protein:  >/= 125 gm  Fluid:  per MD    Gaynell Face, MS, RD, LDN Inpatient Clinical Dietitian Pager: [phone] Weekend/After Hours: [phone]

## 2018-10-27 NOTE — Progress Notes (Signed)
NAME:  Catherine Williams, MRN:  [id number], DOB:  May 26, 1961, LOS: 1 ADMISSION DATE:  10/07/2018, CONSULTATION DATE:  10/07/2018 REFERRING MD:  Dr. Benjamine Mola , CHIEF COMPLAINT:  Cardiac Arrest    Brief History   57 year old obese woman with OSA and CKD stage V admitted 10/20 after PEA cardiac arrest. Underwent hypothermia protocol and started on CRRT  10/26  sustained  brief PEA arrest  related to mucous plugging, underwent bronchoscopy 10/30 brief resp/PEA arrest -mucus plugging again , bronchoscopy minimal blood + mucus suctioned out  Past Medical History  Diastolic HF (EF 56-38, V5IE), COPD, PVD, Chronic Kidney Disease (Placement of Fistula on 10/17)   Significant Hospital Events   10/20 > Presents to ED s/p Cardiac Arrest  10/22 rewarmed at 1pm 10/24 - not awake enough to extubate but did follow commands  10/25 - sister from [place]. [place] at bedside. Daugther at bedside. Both very concerned she is not fully arousable. Per RN/family - can arouse to deep voice and track and nod and follow simple commands very briefly. Then falls asleep. Only 100cc./h urine so far. Per family - s/p LUE fistula recently in anticipation of future HD needs. Baseline creat 3.8s. Creat up at 4.43m%. Office renal doc -> Dr CMarval Regal    10/26 - 2 min PEA arrest with x  1 epi last night. Related to mucus plugging seen in ET tube via bronchoscopy. Sp BAL in RML Started on CRRT overnight -> This aM on CRRT, 40% fio2, PRVC and fentanyl and levophed gtt -> RASS -1 and followed simple commands. Seems weak 10/27 -  2L off with CRRT.  Off levophed 10/17/2018 status post second PEA arrest ? Mucus plugging , CRRT stopped, bronch unimpressive 11/1 trach , PEA /brady arrest p-trach , no sig bleeding , cath neg 11/6 CRRT stopped 11/7 low dose metoprolol started 11/8 iHD  Consults: date of consult/date signed off & final recs:  Cardiology 10/20 reconsult on 10/17/2018 PCCM 10/20  Procedures (surgical and bedside):  ETT  10/20 >> 11/1 Left IJ 10/20 >> 11/1 11/1 Tstomy >> Right IJ hemodialysis catheter 10/12/2018>>11/8 permacth 11/8 (IR)  Significant Diagnostic Tests:   CT Head 10/20 >>neg ECHO 10/21 >> nml LVEF, gr 2 DD EEG 10/21 >> slowing. No epileptiform activity.  Venous doppler legs 10/21 > no evidence DVT CT angio 11/7 >> neg  Micro Data:  Blood 10/20 >>ng Sputum 10/20 >>ng Urine 10/28 >>  Serratia marcescens resp 10/25 >> ng  Antimicrobials:  Cefepime 10/28 >>10/31, 11/3 >> ceftx 10/31 >>11/2  SUBJECTIVE/OVERNIGHT/INTERVAL HX  Chronic Critically ill Off pressors Tolerate iHD yesterday Dark vaginal bleeding per RN  Objective   Blood pressure (!) 127/46, pulse 85, temperature 99.7 F (37.6 C), temperature source Oral, resp. rate (!) 24, height _0  (1.575 m), weight 134.3 kg, SpO2 100 %.    Vent Mode: PRVC FiO2 (%):  [40 %-100 %] 40 % Set Rate:  [20 bmp] 20 bmp Vt Set:  [400 mL] 400 mL PEEP:  [5 cmH20] 5 cmH20 Plateau Pressure:  [18 cmH20-22 cmH20] 22 cmH20   Intake/Output Summary (Last 24 hours) at 10/27/2018 0917 Last data filed at 10/27/2018 0700 Gross per 24 hour  Intake 1253.88 ml  Output 2000 ml  Net -746.12 ml   Filed Weights   10/25/18 0417 10/26/18 0500 10/27/18 0400  Weight: 134.3 kg 136.1 kg 134.3 kg   Acutely ill-appearing, obese woman, awake Follows commands, moves all 4 extremities New permacath S1-S2 distant Decreased breath sounds bilateral  1+ edema Soft and obese abdomen  CXR 11/5 edema pattern  Assessment & Plan:   Acute Hypercarbic Respiratory Failure suspected secondary to decompensated Heart Failure   H/O COPD, OSA on CPAP at HS  Recurrent respiratory arrest secondary to blood clots /mucous plugs -Continue spontaneous breathing trials with goal ATC   V.Fib/PEA Cardiac Arrest - elevated troponin as expected. No evidence of DVT.  Decompensated Heart Failure  H/O Diastolic HF (EF 91-79, X5AV) Recurrent PEA/cardiac arrest x2 - cath  Negative Hypotension post PEA arrest - Low dose metoprolol tolerating -out pt FU with cardiology -dr cooper per s/o note  Acute on Chronic Stage IV Kidney Disease  Baseline Crt high 3s. . Recent placement of UE Fistula . -  iHD per renal   Fever ? HCAP , Serratia UTI -   Cefepime until 11/10 total 14 days   Hypoglycemia -resolved H/O DM, now uncontrolled CBG (last 3)  Recent Labs    10/26/18 2346 10/27/18 0359 10/27/18 0812  GLUCAP 109* 127* 123*   - On Lantus to 30 units daily -on 60 units at home - SSI resistant scale - TF coverage  Acute Encephalopathy  - resolved  Vaginal bleeding - if persists, consider CT abdomen    Disposition / Summary of Today's Plan 10/27/18   Appears dialysis dependent now Cause of her recurrent arrest remains unclear, cath was nondiagnostic, mucous plugs were not impressive.  These were PEA arrests , now tolerating low-dose beta-blockade Hopeful to liberate her from the vent eventually Hopefully to LTAC next week. Can transfer to vent stepdown bed today, discontinue A-line     Code Status: FC Family Communication: Daughters updated daily  The patient is critically ill with multiple organ systems failure and requires high complexity decision making for assessment and support, frequent evaluation and titration of therapies, application of advanced monitoring technologies and extensive interpretation of multiple databases. Critical Care Time devoted to patient care services described in this note independent of APP/resident  time is 31 minutes.    Kara Mead MD. Shade Flood. [facility] Pager [phone] If no response call [phone]   10/27/2018

## 2018-10-27 NOTE — Progress Notes (Signed)
[facility] NEPHROLOGY PROGRESS NOTE  Assessment/ Plan: Pt is a 57 y.o. yo female with advanced CKD status post AV fistula creation, admitted with cardiac arrest and acute on chronic renal failure.  #Acute on advance CKD after cardiac arrest:  She remains anuric.  She required CRRT from 10/25-10/30 (AM).  Patient did not tolerate intermittent hemodialysis on 11/1, only 15 minutes and went into PEA arrest x2.  Patient underwent emergent cardiac cath with no PCI. -Restarted CRRT on 10/22/2018 to 10/24/18.  -The temporary dialysis catheter was removed on 11/6 because of leukocytosis. The [facility] was placed by IR on 11/8 and patient tolerated intermittent HD.  Total UF 2 L.  Volume status acceptable.  Plan for next dialysis on Monday.  Discussed with ICU team. -She has left AV fistula maturing.  #Hypotension: Blood pressure is improved, off pressor.    #Hyperkalemia: Serum potassium level improved.   #Anemia, multifactorial, ABLA due to vagina bleed: Iron saturation 38%, hemoglobin 7. Received aranesp on 11/4.  Received blood transfusion on 11/ 7.  #Cardiac arrest status post cardiac cath, mid RCA 55% not enough to explain PEA arrest per cardiology.  Started metoprolol by cardiology.   Subjective: Seen and examined at bedside.  On vent, trach.  Following commands able to lie flat. Objective Vital signs in last 24 hours: Vitals:   10/27/18 0600 10/27/18 0700 10/27/18 0752 10/27/18 0841  BP:  (!) 94/27  (!) 127/46  Pulse: 90 85  85  Resp: (!) 24 (!) 28  (!) 24  Temp:   99.7 F (37.6 C)   TempSrc:   Oral   SpO2: 100% 100%    Weight:      Height:       Weight change: -1.779 kg  Intake/Output Summary (Last 24 hours) at 10/27/2018 0901 Last data filed at 10/27/2018 0700 Gross per 24 hour  Intake 1253.88 ml  Output 2000 ml  Net -746.12 ml       Labs: Basic Metabolic Panel: Recent Labs  Lab 10/25/18 0416 10/26/18 0506 10/27/18 0402  NA 136 136 135  K 4.6 4.7 4.0   CL 103 100 99  CO2 23 21* 24  GLUCOSE 125* 105* 129*  BUN 56* 66* 40*  CREATININE 3.99* 5.48* 4.07*  CALCIUM 10.1 9.9 9.0  PHOS 5.1* 6.4* 4.8*   Liver Function Tests: Recent Labs  Lab 10/25/18 0416 10/26/18 0506 10/27/18 0402  ALBUMIN 1.9* 1.9* 1.9*   No results for input(s): LIPASE, AMYLASE in the last 168 hours. No results for input(s): AMMONIA in the last 168 hours. CBC: Recent Labs  Lab 10/21/18 0450 10/23/18 0413 10/24/18 0244 10/25/18 0640 10/26/18 0505  WBC 18.5* 15.6* 18.2* 16.8* 12.5*  NEUTROABS  --   --  13.1*  --   --   HGB 7.4* 7.1* 7.2* 6.5* 7.0*  HCT 26.4* 25.5* 25.9* 24.3* 25.7*  MCV 88.0 89.2 90.6 92.4 90.2  PLT 271 313 341 334 361   Cardiac Enzymes: No results for input(s): CKTOTAL, CKMB, CKMBINDEX, TROPONINI in the last 168 hours. CBG: Recent Labs  Lab 10/26/18 1559 10/26/18 2016 10/26/18 2346 10/27/18 0359 10/27/18 0812  GLUCAP 104* 85 109* 127* 123*    Iron Studies: No results for input(s): IRON, TIBC, TRANSFERRIN, FERRITIN in the last 72 hours. Studies/Results: Ct Angio Chest Pe W Or Wo Contrast  Result Date: 10/25/2018 CLINICAL DATA:  Followup for cardiac arrest. Patient with a tracheostomy tube. EXAM: CT ANGIOGRAPHY CHEST WITH CONTRAST TECHNIQUE: Multidetector CT imaging of the  chest was performed using the standard protocol during bolus administration of intravenous contrast. Multiplanar CT image reconstructions and MIPs were obtained to evaluate the vascular anatomy. CONTRAST:  24m ISOVUE-370 IOPAMIDOL (ISOVUE-370) INJECTION 76% COMPARISON:  Chest radiograph, 10/23/2018 and older exams. FINDINGS: Cardiovascular: The opacification of the pulmonary arteries beyond the branch points into the segmental pulmonary arteries is suboptimal. Pulmonary emboli cannot be excluded in the segmental and smaller branches. Allowing for this, there is no evidence of a large, central pulmonary embolus. Heart is mildly enlarged. No pericardial effusion. There  are coronary artery calcifications. No aortic dissection. No aortic atherosclerosis. Mediastinum/Nodes: Well-positioned tracheostomy tube. Nasal/orogastric tube passes into the stomach. No neck base, axillary, mediastinal or hilar masses or enlarged lymph nodes. Trachea is patent. Lungs/Pleura: There is dependent lower lobe atelectasis. Mild bilateral hazy ground-glass opacities are noted most evident in the upper lobes. This is likely due to the expiratory nature of fell to air trapping. Bilateral infection/inflammation is not excluded but felt less likely. 8 x 6 mm, mean 7 mm, pleural-based left upper lobe nodule, image 24, series 9. No other nodules. No pleural effusion.  No pneumothorax. Upper Abdomen: No acute abnormality. Musculoskeletal: No fracture or acute finding. There are degenerative changes along the thoracic spine. There are ill-defined areas of relative lucency, with multiple Schmorl's nodes. No definite lytic bone lesion. Review of the MIP images confirms the above findings. IMPRESSION: 1. Limited exam. Segmental and smaller pulmonary arteries not assessed for pulmonary emboli. Allowing for this, there is no large/central pulmonary embolus. 2. Dependent atelectasis in the lower lobes. Patchy areas of ground-glass opacity most evident in the upper lobes, most likely shunting related small airways disease in this expiratory study. Consider infection/inflammation if there are consistent clinical findings. No evidence of pulmonary edema. Electronically Signed   By: DLajean ManesM.D.   On: 10/25/2018 18:10   Ir Fluoro Guide Cv Line Left  Result Date: 10/26/2018 INDICATION: 57year old female with end-stage renal disease in need of hemodialysis. She presents for placement of a tunneled hemodialysis catheter. EXAM: TUNNELED CENTRAL VENOUS HEMODIALYSIS CATHETER PLACEMENT WITH ULTRASOUND AND FLUOROSCOPIC GUIDANCE MEDICATIONS: Patient currently receiving cefepime as an inpatient. No additional antibiotic  prophylaxis was administered. ANESTHESIA/SEDATION: Moderate (conscious) sedation was employed during this procedure. A total of Versed 1 mg and Fentanyl 50 mcg was administered intravenously. Moderate Sedation Time: 20 minutes. The patient's level of consciousness and vital signs were monitored continuously by radiology nursing throughout the procedure under my direct supervision. FLUOROSCOPY TIME:  Fluoroscopy Time: 0 minutes 42 seconds (9 mGy). COMPLICATIONS: None immediate. PROCEDURE: Informed written consent was obtained from the patient after a discussion of the risks, benefits, and alternatives to treatment. Questions regarding the procedure were encouraged and answered. The left neck and chest were prepped with chlorhexidine in a sterile fashion, and a sterile drape was applied covering the operative field. Maximum barrier sterile technique with sterile gowns and gloves were used for the procedure. A timeout was performed prior to the initiation of the procedure. After creating a small venotomy incision, a micropuncture kit was utilized to access the left internal jugular vein under direct, real-time ultrasound guidance after the overlying soft tissues were anesthetized with 1% lidocaine with epinephrine. Ultrasound image documentation was performed. The microwire was kinked to measure appropriate catheter length. A stiff Glidewire was advanced to the level of the IVC and the micropuncture sheath was exchanged for a peel-away sheath. A palindrome tunneled hemodialysis catheter measuring 28 cm from tip to cuff was tunneled  in a retrograde fashion from the anterior chest wall to the venotomy incision. The catheter was then placed through the peel-away sheath with tips ultimately positioned within the superior aspect of the right atrium. Final catheter positioning was confirmed and documented with a spot radiographic image. The catheter aspirates and flushes normally. The catheter was flushed with appropriate  volume heparin dwells. The catheter exit site was secured with a 0-Prolene retention suture. The venotomy incision was sealed with Dermabond. Dressings were applied. The patient tolerated the procedure well without immediate post procedural complication. IMPRESSION: Successful placement of 28 cm tip to cuff tunneled hemodialysis catheter via the left internal jugular vein with tips terminating within the superior aspect of the right atrium. The catheter is ready for immediate use. Signed, Criselda Peaches, MD, [facility] Radiology Electronically Signed   By: Jacqulynn Cadet M.D.   On: 10/26/2018 13:38   Ir US Guide Vasc Access Left  Result Date: 10/26/2018 INDICATION: 57 year old female with end-stage renal disease in need of hemodialysis. She presents for placement of a tunneled hemodialysis catheter. EXAM: TUNNELED CENTRAL VENOUS HEMODIALYSIS CATHETER PLACEMENT WITH ULTRASOUND AND FLUOROSCOPIC GUIDANCE MEDICATIONS: Patient currently receiving cefepime as an inpatient. No additional antibiotic prophylaxis was administered. ANESTHESIA/SEDATION: Moderate (conscious) sedation was employed during this procedure. A total of Versed 1 mg and Fentanyl 50 mcg was administered intravenously. Moderate Sedation Time: 20 minutes. The patient's level of consciousness and vital signs were monitored continuously by radiology nursing throughout the procedure under my direct supervision. FLUOROSCOPY TIME:  Fluoroscopy Time: 0 minutes 42 seconds (9 mGy). COMPLICATIONS: None immediate. PROCEDURE: Informed written consent was obtained from the patient after a discussion of the risks, benefits, and alternatives to treatment. Questions regarding the procedure were encouraged and answered. The left neck and chest were prepped with chlorhexidine in a sterile fashion, and a sterile drape was applied covering the operative field. Maximum barrier sterile technique with sterile  gowns and gloves were used for the procedure. A timeout was performed prior to the initiation of the procedure. After creating a small venotomy incision, a micropuncture kit was utilized to access the left internal jugular vein under direct, real-time ultrasound guidance after the overlying soft tissues were anesthetized with 1% lidocaine with epinephrine. Ultrasound image documentation was performed. The microwire was kinked to measure appropriate catheter length. A stiff Glidewire was advanced to the level of the IVC and the micropuncture sheath was exchanged for a peel-away sheath. A palindrome tunneled hemodialysis catheter measuring 28 cm from tip to cuff was tunneled in a retrograde fashion from the anterior chest wall to the venotomy incision. The catheter was then placed through the peel-away sheath with tips ultimately positioned within the superior aspect of the right atrium. Final catheter positioning was confirmed and documented with a spot radiographic image. The catheter aspirates and flushes normally. The catheter was flushed with appropriate volume heparin dwells. The catheter exit site was secured with a 0-Prolene retention suture. The venotomy incision was sealed with Dermabond. Dressings were applied. The patient tolerated the procedure well without immediate post procedural complication. IMPRESSION: Successful placement of 28 cm tip to cuff tunneled hemodialysis catheter via the left internal jugular vein with tips terminating within the superior aspect of the right atrium. The catheter is ready for immediate use. Signed, Criselda Peaches, MD, [facility] Radiology Electronically Signed   By: Jacqulynn Cadet M.D.   On: 10/26/2018 13:38    Medications: Infusions: .  sodium chloride 10 mL/hr at 10/26/18 2300  . feeding supplement (VITAL HIGH PROTEIN) 40 mL/hr at 10/26/18 1400    Scheduled Medications: . aspirin  81 mg Oral Daily   . ceFEPime (MAXIPIME) IV  500 mg Intravenous Q24H  . chlorhexidine gluconate (MEDLINE KIT)  15 mL Mouth Rinse BID  . Chlorhexidine Gluconate Cloth  6 each Topical Q0600  . heparin  4.2 mL Intracatheter Once  . heparin injection (subcutaneous)  5,000 Units Subcutaneous Q8H  . insulin aspart  0-20 Units Subcutaneous Q4H  . insulin aspart  6 Units Subcutaneous Q6H  . insulin glargine  30 Units Subcutaneous Daily  . ipratropium-albuterol  3 mL Nebulization Q6H  . mouth rinse  15 mL Mouth Rinse 10 times per day  . metoprolol tartrate  12.5 mg Per NG tube BID  . multivitamin  15 mL Per Tube Daily  . pantoprazole sodium  40 mg Per Tube Daily  . rosuvastatin  10 mg Per Tube QPM    have reviewed scheduled and prn medications.  Physical Exam: General: Trach and on vent, following simple commands Heart:RRR, s1s2 nl Lungs: Coarse breath sound bilateral, no wheezing Abdomen:soft, Non-tender, non-distended Extremities: No edema Dialysis Access: Left tunnel catheter was placed on 11/8.  Site looks clean.  Kerrilyn Azbill Prasad Karee Forge 10/27/2018,9:01 AM  LOS: 20 days

## 2018-10-28 DIAGNOSIS — N186 End stage renal disease: Secondary | ICD-10-CM

## 2018-10-28 LAB — RENAL FUNCTION PANEL
ALBUMIN: 2 g/dL — AB (ref 3.5–5.0)
Anion gap: 15 (ref 5–15)
BUN: 64 mg/dL — AB (ref 6–20)
CALCIUM: 9.6 mg/dL (ref 8.9–10.3)
CO2: 21 mmol/L — ABNORMAL LOW (ref 22–32)
Chloride: 100 mmol/L (ref 98–111)
Creatinine, Ser: 5.46 mg/dL — ABNORMAL HIGH (ref 0.44–1.00)
GFR calc Af Amer: 9 mL/min — ABNORMAL LOW (ref >60)
GFR calc non Af Amer: 8 mL/min — ABNORMAL LOW (ref >60)
Glucose, Bld: 122 mg/dL — ABNORMAL HIGH (ref 70–99)
PHOSPHORUS: 5.9 mg/dL — AB (ref 2.5–4.6)
POTASSIUM: 4.3 mmol/L (ref 3.5–5.1)
Sodium: 136 mmol/L (ref 135–145)

## 2018-10-28 LAB — GLUCOSE, CAPILLARY
GLUCOSE-CAPILLARY: 114 mg/dL — AB (ref 70–99)
GLUCOSE-CAPILLARY: 155 mg/dL — AB (ref 70–99)
GLUCOSE-CAPILLARY: 124 mg/dL — AB (ref 70–99)
Glucose-Capillary: 121 mg/dL — ABNORMAL HIGH (ref 70–99)
Glucose-Capillary: 129 mg/dL — ABNORMAL HIGH (ref 70–99)
Glucose-Capillary: 133 mg/dL — ABNORMAL HIGH (ref 70–99)

## 2018-10-28 LAB — CBC
HCT: 25 % — ABNORMAL LOW (ref 36.0–46.0)
HEMOGLOBIN: 7 g/dL — AB (ref 12.0–15.0)
MCH: 25.1 pg — AB (ref 26.0–34.0)
MCHC: 28 g/dL — AB (ref 30.0–36.0)
MCV: 89.6 fL (ref 80.0–100.0)
PLATELETS: 280 10*3/uL (ref 150–400)
RBC: 2.79 MIL/uL — ABNORMAL LOW (ref 3.87–5.11)
RDW: 17.1 % — AB (ref 11.5–15.5)
WBC: 10.8 10*3/uL — ABNORMAL HIGH (ref 4.0–10.5)
nRBC: 1.1 % — ABNORMAL HIGH (ref 0.0–0.2)

## 2018-10-28 LAB — APTT: aPTT: 32 seconds (ref 24–36)

## 2018-10-28 LAB — MAGNESIUM: MAGNESIUM: 2.3 mg/dL (ref 1.7–2.4)

## 2018-10-28 MED ORDER — PENTAFLUOROPROP-TETRAFLUOROETH EX AERO: 1.0000 "application " | INHALATION_SPRAY | CUTANEOUS | Status: DC | PRN

## 2018-10-28 MED ORDER — CHLORHEXIDINE GLUCONATE CLOTH 2 % EX PADS
6.0000 | MEDICATED_PAD | Freq: Every day | CUTANEOUS | Status: DC
  Administered 2018-10-28 – 2018-10-31 (×5): 6 via TOPICAL

## 2018-10-28 MED ORDER — LIDOCAINE-PRILOCAINE 2.5-2.5 % EX CREA: 1.0000 "application " | TOPICAL_CREAM | CUTANEOUS | Status: DC | PRN

## 2018-10-28 MED ORDER — INSULIN ASPART 100 UNIT/ML ~~LOC~~ SOLN: 4.0000 [IU] | Freq: Four times a day (QID) | SUBCUTANEOUS | Status: DC

## 2018-10-28 MED ORDER — ASPIRIN 81 MG PO CHEW
81.0000 mg | CHEWABLE_TABLET | Freq: Every day | ORAL | Status: DC
  Administered 2018-10-29: 81 mg
  Filled 2018-10-28: qty 1

## 2018-10-28 MED ORDER — LIDOCAINE HCL (PF) 1 % IJ SOLN: 5.0000 mL | INTRAMUSCULAR | Status: DC | PRN

## 2018-10-28 MED ORDER — SODIUM CHLORIDE 0.9 % IV SOLN: 100.0000 mL | INTRAVENOUS | Status: DC | PRN

## 2018-10-28 MED ORDER — ALTEPLASE 2 MG IJ SOLR: 2.0000 mg | Freq: Once | INTRAMUSCULAR | Status: DC | PRN

## 2018-10-28 MED ORDER — ACETAMINOPHEN 160 MG/5ML PO SOLN
650.0000 mg | Freq: Four times a day (QID) | ORAL | Status: DC | PRN
  Administered 2018-10-29 – 2018-10-30 (×2): 650 mg
  Filled 2018-10-28 (×3): qty 20.3

## 2018-10-28 MED ORDER — HEPARIN SODIUM (PORCINE) 1000 UNIT/ML DIALYSIS: 1000.0000 [IU] | INTRAMUSCULAR | Status: DC | PRN

## 2018-10-28 NOTE — Progress Notes (Signed)
NAME:  MALAYSHIA ALL, MRN:  [id number], DOB:  [birth year], LOS: 58 ADMISSION DATE:  10/07/2018, CONSULTATION DATE:  10/07/2018 REFERRING MD:  Dr. Benjamine Mola , CHIEF COMPLAINT:  Cardiac Arrest    Brief History   57 year old obese woman with OSA and CKD stage V admitted 10/20 after PEA cardiac arrest. Underwent hypothermia protocol and started on CRRT  10/26  sustained  brief PEA arrest  related to mucous plugging, underwent bronchoscopy 10/30 brief resp/PEA arrest -mucus plugging again , bronchoscopy minimal blood + mucus suctioned out  Past Medical History  Diastolic HF (EF 99-83, J8SN), COPD, PVD, Chronic Kidney Disease (Placement of Fistula on 10/17)   Significant Hospital Events   10/20 > Presents to ED s/p Cardiac Arrest  10/22 rewarmed at 1pm 10/24 - not awake enough to extubate but did follow commands  10/25 - sister from [place]. [place] at bedside. Daugther at bedside. Both very concerned she is not fully arousable. Per RN/family - can arouse to deep voice and track and nod and follow simple commands very briefly. Then falls asleep. Only 100cc./h urine so far. Per family - s/p LUE fistula recently in anticipation of future HD needs. Baseline creat 3.8s. Creat up at 4.61m%. Office renal doc -> Dr CMarval Regal    10/26 - 2 min PEA arrest with x  1 epi last night. Related to mucus plugging seen in ET tube via bronchoscopy. Sp BAL in RML Started on CRRT overnight -> This aM on CRRT, 40% fio2, PRVC and fentanyl and levophed gtt -> RASS -1 and followed simple commands. Seems weak 10/27 -  2L off with CRRT.  Off levophed 10/17/2018 status post second PEA arrest ? Mucus plugging , CRRT stopped, bronch unimpressive 11/1 trach , PEA /brady arrest p-trach , no sig bleeding , cath neg 11/6 CRRT stopped 11/7 low dose metoprolol started 11/8 iHD  Consults: date of consult/date signed off & final recs:  Cardiology 10/20 reconsult on 10/17/2018 PCCM 10/20  Procedures (surgical and bedside):  ETT  10/20 >> 11/1 Left IJ 10/20 >> 11/1 11/1 Tstomy >> Right IJ hemodialysis catheter 10/12/2018>>11/8 permacth 11/8 (IR)  Significant Diagnostic Tests:   CT Head 10/20 >>neg ECHO 10/21 >> nml LVEF, gr 2 DD EEG 10/21 >> slowing. No epileptiform activity.  Venous doppler legs 10/21 > no evidence DVT CT angio 11/7 >> neg  Micro Data:  Blood 10/20 >>ng Sputum 10/20 >>ng Urine 10/28 >>  Serratia marcescens resp 10/25 >> ng  Antimicrobials:  Cefepime 10/28 >>10/31, 11/3 >>11/10 ceftx 10/31 >>11/2  SUBJECTIVE/OVERNIGHT/INTERVAL HX  Awake alert no acute distress No complaints at this time Vaginal bleeding less therefore no need for CT the abdomen.Dr. MThereasa Soloaware  Objective   Blood pressure (!) 105/27, pulse 83, temperature 99.1 F (37.3 C), temperature source Oral, resp. rate (!) 25, height _0  (1.575 m), weight 134.7 kg, SpO2 100 %.    Vent Mode: CPAP;PSV FiO2 (%):  [40 %] 40 % Set Rate:  [20 bmp] 20 bmp Vt Set:  [400 mL] 400 mL PEEP:  [5 cmH20] 5 cmH20 Pressure Support:  [15 cmH20] 15 cmH20 Plateau Pressure:  [17 cmH20-25 cmH20] 17 cmH20   Intake/Output Summary (Last 24 hours) at 10/28/2018 1017 Last data filed at 10/28/2018 0700 Gross per 24 hour  Intake 1249.94 ml  Output -  Net 1249.94 ml   Filed Weights   10/26/18 0500 10/27/18 0400 10/28/18 0400  Weight: 136.1 kg 134.3 kg 134.7 kg   General: Obese female who is  awake alert currently on full mechanical ventilatory support HEENT tracheostomy in place Neuro: Pulse commands moves all extremities CV sounds are distant PULM: Decreased breath sounds in the bases BE:MLJQ, non-tender, bsx4 active  GU: Decreased vaginal bleeding Extremities: warm/dry, plus edema  Skin: no rashes or lesions   CXR On 10/28/2017  Assessment & Plan:   Acute Hypercarbic Respiratory Failure suspected secondary to decompensated Heart Failure   H/O COPD, OSA on CPAP at HS  Recurrent respiratory arrest secondary to blood clots  /mucous plugs -Wean per protocol   V.Fib/PEA Cardiac Arrest - elevated troponin as expected. No evidence of DVT.  Decompensated Heart Failure  H/O Diastolic HF (EF 49-20, F0OF) Recurrent PEA/cardiac arrest x2 - cath Negative Hypotension post PEA arrest -She is on low-dose metoprolol -She will need cardiology follow-up  Acute on Chronic Stage IV Kidney Disease  Baseline Crt high 3s. . Recent placement of UE Fistula . -Hemodialysis per renal  Fever ? HCAP , Serratia UTI -   Cefepime completed 10/28/2018   Hypoglycemia -resolved H/O DM, now uncontrolled CBG (last 3)  Recent Labs    10/27/18 2350 10/28/18 0354 10/28/18 0743  GLUCAP 121* 114* 129*   -Continue current Lantus -Sliding scale insulin protocol   Acute Encephalopathy  -Resolved she is awake and follows commands  Vaginal bleeding  -Continue to monitor   Disposition / Summary of Today's Plan 10/28/18   Continues to require intermittent hemodialysis Etiology of recurrent cardiac arrest remains unclear,  cardiac catheterization remarkable Wean from ventilator per protocol Proceed with LTAC as tolerated Now a stepdown unit patient we will follow for vent and trach. Vaginal bleeding decreased therefore no need for CT of the abdomen     Code Status: FC Family Communication: Daughters updated daily  Richardson Landry Katasha Riga ACNP Maryanna Shape PCCM Pager [phone] till 1 pm If no answer page [phone] 10/28/2018, 10:17 AM

## 2018-10-28 NOTE — Plan of Care (Signed)
  Problem: Clinical Measurements: Goal: Ability to maintain clinical measurements within normal limits will improve Outcome: Progressing Goal: Will remain free from infection Outcome: Progressing Goal: Diagnostic test results will improve Outcome: Progressing Goal: Respiratory complications will improve Outcome: Progressing Goal: Cardiovascular complication will be avoided Outcome: Progressing   Problem: Activity: Goal: Risk for activity intolerance will decrease Outcome: Progressing   Problem: Nutrition: Goal: Adequate nutrition will be maintained Outcome: Progressing   Problem: Coping: Goal: Level of anxiety will decrease Outcome: Progressing   Problem: Elimination: Goal: Will not experience complications related to bowel motility Outcome: Progressing Goal: Will not experience complications related to urinary retention Outcome: Progressing   Problem: Pain Managment: Goal: General experience of comfort will improve Outcome: Progressing   Problem: Safety: Goal: Ability to remain free from injury will improve Outcome: Progressing   Problem: Cardiac: Goal: Ability to achieve and maintain adequate cardiopulmonary perfusion will improve Outcome: Progressing   Problem: Neurologic: Goal: Promote progressive neurologic recovery Outcome: Progressing

## 2018-10-28 NOTE — Progress Notes (Signed)
Pt placed back on full support at this time due to increased RR >35.  Pt tolerating well, RT will monitor

## 2018-10-28 NOTE — Progress Notes (Signed)
[facility] NEPHROLOGY PROGRESS NOTE  Assessment/ Plan: Pt is a 57 y.o. yo female with advanced CKD status post AV fistula creation, admitted with cardiac arrest and acute on chronic renal failure.  #Acute on advance CKD after cardiac arrest:  She remains anuric.  She required CRRT from 10/25-10/30 (AM).  Patient did not tolerate intermittent hemodialysis on 11/1, only 15 minutes and went into PEA arrest x2.  Patient underwent emergent cardiac cath with no PCI. -Restarted CRRT on 10/22/2018 to 10/24/18.  -The temporary dialysis catheter was removed on 11/6 because of leukocytosis. The [facility] was placed by IR on 11/8 and patient tolerated intermittent HD.  Plan for next dialysis tomorrow. Needs outpatient dialysis arrangement.  -She has left AV fistula maturing.  #Hypotension: Blood pressure is improved, off pressor.    #Hyperkalemia: Serum potassium level improved.   #Anemia, multifactorial, ABLA due to vagina bleed: Iron saturation 38%, hemoglobin 7. Received aranesp on 11/4.  Received blood transfusion on 11/ 7.  Monitor CBC.  #Cardiac arrest status post cardiac cath, mid RCA 55% not enough to explain PEA arrest per cardiology.  Started metoprolol by cardiology.   Subjective: Seen and examined at bedside.  On vent, trach.  Following simple commands, no significant clinical change. Objective Vital signs in last 24 hours: Vitals:   10/28/18 0500 10/28/18 0600 10/28/18 0745 10/28/18 0822  BP: 119/61   (!) 105/27  Pulse: 83 84  83  Resp: (!) 28 (!) 24  (!) 25  Temp:   99.1 F (37.3 C)   TempSrc:   Oral   SpO2: 100% 100%    Weight:      Height:       Weight change: 0.4 kg  Intake/Output Summary (Last 24 hours) at 10/28/2018 0922 Last data filed at 10/28/2018 0700 Gross per 24 hour  Intake 1249.94 ml  Output -  Net 1249.94 ml       Labs: Basic Metabolic Panel: Recent Labs  Lab 10/26/18 0506 10/27/18 0402 10/28/18 0632  NA 136 135 136  K 4.7 4.0 4.3  CL  100 99 100  CO2 21* 24 21*  GLUCOSE 105* 129* 122*  BUN 66* 40* 64*  CREATININE 5.48* 4.07* 5.46*  CALCIUM 9.9 9.0 9.6  PHOS 6.4* 4.8* 5.9*   Liver Function Tests: Recent Labs  Lab 10/26/18 0506 10/27/18 0402 10/28/18 0632  ALBUMIN 1.9* 1.9* 2.0*   No results for input(s): LIPASE, AMYLASE in the last 168 hours. No results for input(s): AMMONIA in the last 168 hours. CBC: Recent Labs  Lab 10/23/18 0413 10/24/18 0244 10/25/18 0640 10/26/18 0505 10/28/18 0632  WBC 15.6* 18.2* 16.8* 12.5* 10.8*  NEUTROABS  --  13.1*  --   --   --   HGB 7.1* 7.2* 6.5* 7.0* 7.0*  HCT 25.5* 25.9* 24.3* 25.7* 25.0*  MCV 89.2 90.6 92.4 90.2 89.6  PLT 313 341 334 361 280   Cardiac Enzymes: No results for input(s): CKTOTAL, CKMB, CKMBINDEX, TROPONINI in the last 168 hours. CBG: Recent Labs  Lab 10/27/18 1623 10/27/18 2114 10/27/18 2350 10/28/18 0354 10/28/18 0743  GLUCAP 123* 126* 121* 114* 129*    Iron Studies: No results for input(s): IRON, TIBC, TRANSFERRIN, FERRITIN in the last 72 hours. Studies/Results: Ir Fluoro Guide Cv Line Left  Result Date: 10/26/2018 INDICATION: 57 year old female with end-stage renal disease in need of hemodialysis. She presents for placement of a tunneled hemodialysis catheter. EXAM: TUNNELED CENTRAL VENOUS HEMODIALYSIS CATHETER PLACEMENT WITH ULTRASOUND AND FLUOROSCOPIC GUIDANCE MEDICATIONS: Patient  currently receiving cefepime as an inpatient. No additional antibiotic prophylaxis was administered. ANESTHESIA/SEDATION: Moderate (conscious) sedation was employed during this procedure. A total of Versed 1 mg and Fentanyl 50 mcg was administered intravenously. Moderate Sedation Time: 20 minutes. The patient's level of consciousness and vital signs were monitored continuously by radiology nursing throughout the procedure under my direct supervision. FLUOROSCOPY TIME:  Fluoroscopy Time: 0 minutes 42 seconds (9 mGy). COMPLICATIONS: None immediate. PROCEDURE: Informed  written consent was obtained from the patient after a discussion of the risks, benefits, and alternatives to treatment. Questions regarding the procedure were encouraged and answered. The left neck and chest were prepped with chlorhexidine in a sterile fashion, and a sterile drape was applied covering the operative field. Maximum barrier sterile technique with sterile gowns and gloves were used for the procedure. A timeout was performed prior to the initiation of the procedure. After creating a small venotomy incision, a micropuncture kit was utilized to access the left internal jugular vein under direct, real-time ultrasound guidance after the overlying soft tissues were anesthetized with 1% lidocaine with epinephrine. Ultrasound image documentation was performed. The microwire was kinked to measure appropriate catheter length. A stiff Glidewire was advanced to the level of the IVC and the micropuncture sheath was exchanged for a peel-away sheath. A palindrome tunneled hemodialysis catheter measuring 28 cm from tip to cuff was tunneled in a retrograde fashion from the anterior chest wall to the venotomy incision. The catheter was then placed through the peel-away sheath with tips ultimately positioned within the superior aspect of the right atrium. Final catheter positioning was confirmed and documented with a spot radiographic image. The catheter aspirates and flushes normally. The catheter was flushed with appropriate volume heparin dwells. The catheter exit site was secured with a 0-Prolene retention suture. The venotomy incision was sealed with Dermabond. Dressings were applied. The patient tolerated the procedure well without immediate post procedural complication. IMPRESSION: Successful placement of 28 cm tip to cuff tunneled hemodialysis catheter via the left internal jugular vein with tips terminating within the superior aspect of the right atrium. The catheter is ready for immediate use. Signed, Criselda Peaches, MD, [facility] Radiology Electronically Signed   By: Jacqulynn Cadet M.D.   On: 10/26/2018 13:38   Ir US Guide Vasc Access Left  Result Date: 10/26/2018 INDICATION: 57 year old female with end-stage renal disease in need of hemodialysis. She presents for placement of a tunneled hemodialysis catheter. EXAM: TUNNELED CENTRAL VENOUS HEMODIALYSIS CATHETER PLACEMENT WITH ULTRASOUND AND FLUOROSCOPIC GUIDANCE MEDICATIONS: Patient currently receiving cefepime as an inpatient. No additional antibiotic prophylaxis was administered. ANESTHESIA/SEDATION: Moderate (conscious) sedation was employed during this procedure. A total of Versed 1 mg and Fentanyl 50 mcg was administered intravenously. Moderate Sedation Time: 20 minutes. The patient's level of consciousness and vital signs were monitored continuously by radiology nursing throughout the procedure under my direct supervision. FLUOROSCOPY TIME:  Fluoroscopy Time: 0 minutes 42 seconds (9 mGy). COMPLICATIONS: None immediate. PROCEDURE: Informed written consent was obtained from the patient after a discussion of the risks, benefits, and alternatives to treatment. Questions regarding the procedure were encouraged and answered. The left neck and chest were prepped with chlorhexidine in a sterile fashion, and a sterile drape was applied covering the operative field. Maximum barrier sterile technique with sterile gowns and gloves were used for the procedure. A timeout was performed prior to the initiation of the procedure. After creating a small venotomy incision, a micropuncture kit was utilized to  access the left internal jugular vein under direct, real-time ultrasound guidance after the overlying soft tissues were anesthetized with 1% lidocaine with epinephrine. Ultrasound image documentation was performed. The microwire was kinked to measure appropriate catheter length. A stiff Glidewire was advanced to  the level of the IVC and the micropuncture sheath was exchanged for a peel-away sheath. A palindrome tunneled hemodialysis catheter measuring 28 cm from tip to cuff was tunneled in a retrograde fashion from the anterior chest wall to the venotomy incision. The catheter was then placed through the peel-away sheath with tips ultimately positioned within the superior aspect of the right atrium. Final catheter positioning was confirmed and documented with a spot radiographic image. The catheter aspirates and flushes normally. The catheter was flushed with appropriate volume heparin dwells. The catheter exit site was secured with a 0-Prolene retention suture. The venotomy incision was sealed with Dermabond. Dressings were applied. The patient tolerated the procedure well without immediate post procedural complication. IMPRESSION: Successful placement of 28 cm tip to cuff tunneled hemodialysis catheter via the left internal jugular vein with tips terminating within the superior aspect of the right atrium. The catheter is ready for immediate use. Signed, Criselda Peaches, MD, [facility] Radiology Electronically Signed   By: Jacqulynn Cadet M.D.   On: 10/26/2018 13:38    Medications: Infusions: . sodium chloride 10 mL/hr at 10/26/18 2300  . sodium chloride    . sodium chloride    . feeding supplement (VITAL HIGH PROTEIN) 1,000 mL (10/27/18 1328)    Scheduled Medications: . aspirin  81 mg Oral Daily  . ceFEPime (MAXIPIME) IV  500 mg Intravenous Q24H  . chlorhexidine gluconate (MEDLINE KIT)  15 mL Mouth Rinse BID  . Chlorhexidine Gluconate Cloth  6 each Topical Q0600  . feeding supplement (PRO-STAT SUGAR FREE 64)  30 mL Per Tube TID  . heparin  4.2 mL Intracatheter Once  . heparin injection (subcutaneous)  5,000 Units Subcutaneous Q8H  . insulin aspart  0-20 Units Subcutaneous Q4H  . insulin aspart  6 Units Subcutaneous Q6H  . insulin glargine   30 Units Subcutaneous Daily  . ipratropium-albuterol  3 mL Nebulization Q6H  . mouth rinse  15 mL Mouth Rinse 10 times per day  . metoprolol tartrate  12.5 mg Per NG tube BID  . multivitamin  15 mL Per Tube Daily  . pantoprazole sodium  40 mg Per Tube Daily  . rosuvastatin  10 mg Per Tube QPM    have reviewed scheduled and prn medications.  Physical Exam: General: Trach and on vent, following simple commands Heart: Regular rate rhythm S1-S2 normal, no rubs Lungs: Breath sound bilateral, no wheezing Abdomen:soft, Non-tender, non-distended Extremities: No edema Dialysis Access: Left tunnel catheter was placed on 11/8.  Site looks clean.  Dron Prasad Bhandari 10/28/2018,9:22 AM  LOS: 21 days

## 2018-10-28 NOTE — Progress Notes (Signed)
[place] TEAM 1 - Stepdown/ICU TEAM  Catherine Williams  IHW:[id number] DOB: Apr 18, 1961 DOA: 10/07/2018 PCP: Vonna Drafts, FNP    Brief Narrative:  57yo F w/ a hx of obesity, OSA, and CKD stage V who was admitted 10/20 after a PEA cardiac arrest. Underwent hypothermia protocol andstarted on CRRT. On  10/26 she sustained a brief PEA arrest related to mucous plugging,and underwent bronchoscopy. Again on 10/30 she suffered another brief resp/PEA arrest w/ mucus plugging. A second bronchoscopy noted minimal blood + mucus suctioned out.  Significant Events: 10/20 ED s/p Cardiac Arrest - hypothermia protocol 10/22 rewarmed at 1pm 10/25 very sedate - minimally responsive 10/26 2 min PEA arrest with x 1 epi > mucus plugging in ET per cronch 10/26 CRRT overnight - levophed gtt  10/27 Off levophed 10/30 second PEA arrest > mucus plugging - bronch unimpressive 11/1 trach - PEA/brady arrest during iHD - emergent cardiac cath neg 11/6 CRRT stopped 11/8 permacath placed in IR > iHD 11/9 vaginal bleeding dark blood  11/10 TRH assume care   Subjective: In good spirits. Tells me she wants to go home. Denies cp, n/v, or abdom pain. RN is going to help her sit up in a chair today. Vaginal bleeding appears to have nearly stopped per RN.   Assessment & Plan:  Recurring PEA arrest  ?due to mucous plugging - etiology not fully clear - cardiac cath unremarkable   Recurrent acute respiratory arrest  Now trach/vent dependant - weaning per PCCM - appears she will benefit from [facility]  COPD Well compensated at this time  OSA on CPAP at HS previously   Decompensated Diastolic Heart Failure  EF 55-60, G1DD - volume management per HD   Acute on Chronic Stage IV Kidney Disease - anuric  recent placement of UE Fistula - now requiring iHD per Nephrology    Multifactorial anemia Some low grade acute blood loss w/ vaginal bleeding + anemia of CKD + anemia of critical illness - epo and Fe per  Nephrology - s/p 1U PRBC   ?HCAP + Serratia UTI Cefepime through 11/10 (~total 14 days)  DM CBG controlled   Vaginal bleeding - if persists, consider CT abdomen   Morbid obesity - Body mass index is 54.31 kg/m.  DVT prophylaxis: SCDs Code Status: FULL CODE Family Communication: no family present at time of exam  Disposition Plan:   Consultants:  Cardiology PCCM  Antimicrobials:  Cefepime 10/28  >10/31 + 11/3 > Ceftx 10/31 > 11/2  Objective: Blood pressure (!) 105/27, pulse 83, temperature 99.1 F (37.3 C), temperature source Oral, resp. rate (!) 25, height 5' 2"  (1.575 m), weight 134.7 kg, SpO2 100 %.  Intake/Output Summary (Last 24 hours) at 10/28/2018 1017 Last data filed at 10/28/2018 0700 Gross per 24 hour  Intake 1249.94 ml  Output -  Net 1249.94 ml   Filed Weights   10/26/18 0500 10/27/18 0400 10/28/18 0400  Weight: 136.1 kg 134.3 kg 134.7 kg    Examination: General: No acute respiratory distress on vent in bed  Lungs: very distant BS th/o all fields - no wheezing  Cardiovascular: distant HS - no M or rub - RRR Abdomen: Obese, soft, bs+, no rebound, nontender  Extremities: trace B LE edema   CBC: Recent Labs  Lab 10/24/18 0244 10/25/18 0640 10/26/18 0505 10/28/18 0632  WBC 18.2* 16.8* 12.5* 10.8*  NEUTROABS 13.1*  --   --   --   HGB 7.2* 6.5* 7.0* 7.0*  HCT 25.9* 24.3*  25.7* 25.0*  MCV 90.6 92.4 90.2 89.6  PLT 341 334 361 202   Basic Metabolic Panel: Recent Labs  Lab 10/26/18 0505 10/26/18 0506 10/27/18 0402 10/28/18 0632  NA  --  136 135 136  K  --  4.7 4.0 4.3  CL  --  100 99 100  CO2  --  21* 24 21*  GLUCOSE  --  105* 129* 122*  BUN  --  66* 40* 64*  CREATININE  --  5.48* 4.07* 5.46*  CALCIUM  --  9.9 9.0 9.6  MG 2.7*  --  2.4 2.3  PHOS  --  6.4* 4.8* 5.9*   GFR: Estimated Creatinine Clearance: 14.7 mL/min (A) (by C-G formula based on SCr of 5.46 mg/dL (H)).  Liver Function Tests: Recent Labs  Lab 10/25/18 0416  10/26/18 0506 10/27/18 0402 10/28/18 0632  ALBUMIN 1.9* 1.9* 1.9* 2.0*    Coagulation Profile: Recent Labs  Lab 10/24/18 0243  INR 1.19    HbA1C: Hgb A1c MFr Bld  Date/Time Value Ref Range Status  10/23/2016 02:54 PM 8.0 (H) 4.8 - 5.6 % Final    Comment:    (NOTE)         Pre-diabetes: 5.7 - 6.4         Diabetes: >6.4         Glycemic control for adults with diabetes: <7.0   07/17/2015 02:09 AM 9.4 (H) 4.8 - 5.6 % Final    Comment:    (NOTE)         Pre-diabetes: 5.7 - 6.4         Diabetes: >6.4         Glycemic control for adults with diabetes: <7.0     CBG: Recent Labs  Lab 10/27/18 1623 10/27/18 2114 10/27/18 2350 10/28/18 0354 10/28/18 0743  GLUCAP 123* 126* 121* 114* 129*    Scheduled Meds: . aspirin  81 mg Oral Daily  . ceFEPime (MAXIPIME) IV  500 mg Intravenous Q24H  . chlorhexidine gluconate (MEDLINE KIT)  15 mL Mouth Rinse BID  . Chlorhexidine Gluconate Cloth  6 each Topical Q0600  . feeding supplement (PRO-STAT SUGAR FREE 64)  30 mL Per Tube TID  . heparin  4.2 mL Intracatheter Once  . heparin injection (subcutaneous)  5,000 Units Subcutaneous Q8H  . insulin aspart  0-20 Units Subcutaneous Q4H  . insulin aspart  6 Units Subcutaneous Q6H  . insulin glargine  30 Units Subcutaneous Daily  . ipratropium-albuterol  3 mL Nebulization Q6H  . mouth rinse  15 mL Mouth Rinse 10 times per day  . metoprolol tartrate  12.5 mg Per NG tube BID  . multivitamin  15 mL Per Tube Daily  . pantoprazole sodium  40 mg Per Tube Daily  . rosuvastatin  10 mg Per Tube QPM      LOS: 21 days   Cherene Altes, MD [facility] Office  [phone] Pager - Text Page per Amion  If 7PM-7AM, please contact night-coverage per Amion 10/28/2018, 10:17 AM

## 2018-10-29 LAB — RENAL FUNCTION PANEL
ANION GAP: 14 (ref 5–15)
Albumin: 2 g/dL — ABNORMAL LOW (ref 3.5–5.0)
BUN: 81 mg/dL — ABNORMAL HIGH (ref 6–20)
CALCIUM: 9.9 mg/dL (ref 8.9–10.3)
CHLORIDE: 99 mmol/L (ref 98–111)
CO2: 25 mmol/L (ref 22–32)
CREATININE: 6.62 mg/dL — AB (ref 0.44–1.00)
GFR calc Af Amer: 7 mL/min — ABNORMAL LOW (ref >60)
GFR calc non Af Amer: 6 mL/min — ABNORMAL LOW (ref >60)
GLUCOSE: 101 mg/dL — AB (ref 70–99)
Phosphorus: 7.2 mg/dL — ABNORMAL HIGH (ref 2.5–4.6)
Potassium: 4.5 mmol/L (ref 3.5–5.1)
SODIUM: 138 mmol/L (ref 135–145)

## 2018-10-29 LAB — GLUCOSE, CAPILLARY
GLUCOSE-CAPILLARY: 101 mg/dL — AB (ref 70–99)
GLUCOSE-CAPILLARY: 112 mg/dL — AB (ref 70–99)
GLUCOSE-CAPILLARY: 142 mg/dL — AB (ref 70–99)
Glucose-Capillary: 89 mg/dL (ref 70–99)
Glucose-Capillary: 94 mg/dL (ref 70–99)

## 2018-10-29 LAB — CBC
HEMATOCRIT: 25.1 % — AB (ref 36.0–46.0)
HEMOGLOBIN: 7.1 g/dL — AB (ref 12.0–15.0)
MCH: 25.3 pg — ABNORMAL LOW (ref 26.0–34.0)
MCHC: 28.3 g/dL — ABNORMAL LOW (ref 30.0–36.0)
MCV: 89.3 fL (ref 80.0–100.0)
NRBC: 0.9 % — AB (ref 0.0–0.2)
Platelets: 275 10*3/uL (ref 150–400)
RBC: 2.81 MIL/uL — ABNORMAL LOW (ref 3.87–5.11)
RDW: 17 % — AB (ref 11.5–15.5)
WBC: 9.1 10*3/uL (ref 4.0–10.5)

## 2018-10-29 LAB — MAGNESIUM: Magnesium: 2.4 mg/dL (ref 1.7–2.4)

## 2018-10-29 LAB — APTT: APTT: 33 s (ref 24–36)

## 2018-10-29 MED ORDER — HEPARIN SODIUM (PORCINE) 1000 UNIT/ML IJ SOLN
INTRAMUSCULAR | Status: AC
  Filled 2018-10-29: qty 5

## 2018-10-29 NOTE — Progress Notes (Signed)
Pt placed back on full support due to increased WOB & RR >35.  Pt tolerating full support well.   Sutures removed from trach per MD order.

## 2018-10-29 NOTE — Progress Notes (Signed)
S: awake and no complaints O:BP (!) 101/31   Pulse 84   Temp 99.4 F (37.4 C) (Oral)   Resp (!) 22   Ht 5' 2"  (1.575 m)   Wt 134.6 kg   SpO2 100%   BMI 54.27 kg/m   Intake/Output Summary (Last 24 hours) at 10/29/2018 0912 Last data filed at 10/29/2018 0600 Gross per 24 hour  Intake 800 ml  Output -  Net 800 ml   Intake/Output: I/O last 3 completed shifts: In: 1646.5 [I.V.:240; NG/GT:1400; IV Piggyback:6.5] Out: -   Intake/Output this shift:  No intake/output data recorded. Weight change: -0.1 kg Gen: obese AAF on vent via trach CVS: no rub Resp: decreased BS Abd: obese +BS, soft, NT Ext: trace edema, LUE AVF +T/B (faint)  Recent Labs  Lab 10/23/18 1552 10/24/18 0304 10/25/18 0416 10/26/18 0506 10/27/18 0402 10/28/18 0632 10/29/18 0327  NA 136 135 136 136 135 136 138  K 4.6 4.6 4.6 4.7 4.0 4.3 4.5  CL 104 102 103 100 99 100 99  CO2 22 24 23  21* 24 21* 25  GLUCOSE 159* 141* 125* 105* 129* 122* 101*  BUN 65* 44* 56* 66* 40* 64* 81*  CREATININE 3.61* 2.66* 3.99* 5.48* 4.07* 5.46* 6.62*  ALBUMIN 2.0* 2.1* 1.9* 1.9* 1.9* 2.0* 2.0*  CALCIUM 8.4* 9.5 10.1 9.9 9.0 9.6 9.9  PHOS 3.2 3.2 5.1* 6.4* 4.8* 5.9* 7.2*   Liver Function Tests: Recent Labs  Lab 10/27/18 0402 10/28/18 0632 10/29/18 0327  ALBUMIN 1.9* 2.0* 2.0*   No results for input(s): LIPASE, AMYLASE in the last 168 hours. No results for input(s): AMMONIA in the last 168 hours. CBC: Recent Labs  Lab 10/24/18 0244 10/25/18 0640 10/26/18 0505 10/28/18 0632 10/29/18 0327  WBC 18.2* 16.8* 12.5* 10.8* 9.1  NEUTROABS 13.1*  --   --   --   --   HGB 7.2* 6.5* 7.0* 7.0* 7.1*  HCT 25.9* 24.3* 25.7* 25.0* 25.1*  MCV 90.6 92.4 90.2 89.6 89.3  PLT 341 334 361 280 275   Cardiac Enzymes: No results for input(s): CKTOTAL, CKMB, CKMBINDEX, TROPONINI in the last 168 hours. CBG: Recent Labs  Lab 10/28/18 1632 10/28/18 2034 10/28/18 2357 10/29/18 0404 10/29/18 0811  GLUCAP 133* 124* 89 101* 94     Iron Studies: No results for input(s): IRON, TIBC, TRANSFERRIN, FERRITIN in the last 72 hours. Studies/Results: No results found. Marland Kitchen aspirin  81 mg Per Tube Daily  . chlorhexidine gluconate (MEDLINE KIT)  15 mL Mouth Rinse BID  . Chlorhexidine Gluconate Cloth  6 each Topical Q0600  . feeding supplement (PRO-STAT SUGAR FREE 64)  30 mL Per Tube TID  . heparin  4.2 mL Intracatheter Once  . heparin injection (subcutaneous)  5,000 Units Subcutaneous Q8H  . insulin aspart  0-20 Units Subcutaneous Q4H  . insulin glargine  30 Units Subcutaneous Daily  . ipratropium-albuterol  3 mL Nebulization Q6H  . mouth rinse  15 mL Mouth Rinse 10 times per day  . metoprolol tartrate  12.5 mg Per NG tube BID  . multivitamin  15 mL Per Tube Daily  . pantoprazole sodium  40 mg Per Tube Daily  . rosuvastatin  10 mg Per Tube QPM    BMET    Component Value Date/Time   NA 138 10/29/2018 0327   K 4.5 10/29/2018 0327   CL 99 10/29/2018 0327   CO2 25 10/29/2018 0327   GLUCOSE 101 (H) 10/29/2018 0327   BUN 81 (H) 10/29/2018 0327  CREATININE 6.62 (H) 10/29/2018 0327   CALCIUM 9.9 10/29/2018 0327   CALCIUM 9.7 08/01/2018 1113   GFRNONAA 6 (L) 10/29/2018 0327   GFRAA 7 (L) 10/29/2018 0327   CBC    Component Value Date/Time   WBC 9.1 10/29/2018 0327   RBC 2.81 (L) 10/29/2018 0327   HGB 7.1 (L) 10/29/2018 0327   HCT 25.1 (L) 10/29/2018 0327   PLT 275 10/29/2018 0327   MCV 89.3 10/29/2018 0327   MCH 25.3 (L) 10/29/2018 0327   MCHC 28.3 (L) 10/29/2018 0327   RDW 17.0 (H) 10/29/2018 0327   LYMPHSABS 2.4 10/24/2018 0244   MONOABS 1.4 (H) 10/24/2018 0244   EOSABS 0.3 10/24/2018 0244   BASOSABS 0.1 10/24/2018 0244     Assessment/Plan:  1. AKI/CKD stage 4 in setting of cardiac arrest and has now been dialysis dependent since 10/12/18 (initially on CRRT until 10/30 and transitioned to IHD but complicated by PEA arrest x2).  Tolerated last IHD and plan for HD today.  2. V fib/PEA arrest- cardiac cath  without findings for intervention. Has had multiple arrests to date, no clear etiology 3. Vascular access- [facility] TDC placed 10/26/18 and LUE AVF  4. Hypotension- may need midodrine for HD prn 5. Anemia of ckd and ABLA due to vaginal bleed.  On Aranesp, transfuse prn 6. VDRF- s/p trach per PCCM 7. Vaginal bleeding- consider gyn consult. 8. Disposition- will need LTC placement   Donetta Potts, MD [facility] [phone]

## 2018-10-29 NOTE — Progress Notes (Signed)
[place] TEAM 1 - Stepdown/ICU TEAM  Catherine Williams  ZOX:[id number] DOB: [birth year] DOA: 10/07/2018 PCP: Vonna Drafts, FNP    Brief Narrative:  58yo F w/ a hx of obesity, OSA, and CKD stage V who was admitted 10/20 after a PEA cardiac arrest. Underwent hypothermia protocol andstarted on CRRT. On  10/26 she sustained a brief PEA arrest related to mucous plugging,and underwent bronchoscopy. Again on 10/30 she suffered another brief resp/PEA arrest w/ mucus plugging. A second bronchoscopy noted minimal blood + mucus suctioned out.  Significant Events: 10/20 ED s/p Cardiac Arrest - hypothermia protocol 10/22 rewarmed at 1pm 10/25 very sedate - minimally responsive 10/26 2 min PEA arrest with x 1 epi > mucus plugging in ET per cronch 10/26 CRRT overnight - levophed gtt  10/27 Off levophed 10/30 second PEA arrest > mucus plugging - bronch unimpressive 11/1 trach - PEA/brady arrest during iHD - emergent cardiac cath neg 11/6 CRRT stopped 11/8 permacath placed in IR > iHD 11/9 vaginal bleeding dark blood  11/10 TRH assume care   Subjective: Resting comfortably. Tolerating dialysis presently. RN reports a vaginal blood clot was passed this morning, but no other evidence of ongoing bleeding has been noted. She denies cp or sob.   Assessment & Plan:  Recurring PEA arrest  ?due to mucous plugging - etiology not fully clear - cardiac cath unremarkable   Recurrent acute respiratory arrest  Now trach/vent dependant - weaning per PCCM - appears she will benefit from [facility]  COPD Well compensated at this time  OSA on CPAP at HS previously   Decompensated Diastolic Heart Failure  EF 55-60, G1DD - volume management per HD  [facility] Weights   10/28/18 0400 10/29/18 0500 10/29/18 1350  Weight: 134.7 kg 134.6 kg 133 kg    Acute on Chronic Stage IV Kidney Disease - anuric  recent placement of UE Fistula - now requiring iHD per Nephrology    Multifactorial anemia Some low grade  acute blood loss w/ vaginal bleeding + anemia of CKD + anemia of critical illness - epo and Fe per Nephrology - s/p 1U PRBC this admit - follow trend   Recent Labs  Lab 10/24/18 0244 10/25/18 0640 10/26/18 0505 10/28/18 0632 10/29/18 0327  HGB 7.2* 6.5* 7.0* 7.0* 7.1*    ?HCAP + Serratia UTI Cefepime through 11/10 (~total 14 days)  DM CBG controlled   Vaginal bleeding Unclear if this is a chronic issue or not - had a normal CT abdom for this indication in 2012 - appears this may be self-limited/stopping now - follow clinically - if recurs will have to consider [place] v/s CT +/- GYN consult   Morbid obesity - Body mass index is 53.63 kg/m.  DVT prophylaxis: SCDs Code Status: FULL CODE Family Communication: no family present at time of exam  Disposition Plan: SDU  Consultants:  Cardiology PCCM  Antimicrobials:  Cefepime 10/28  >10/31 + 11/3 > 11/10 Ceftx 10/31 > 11/2  Objective: Blood pressure (!) 112/52, pulse 84, temperature 99.2 F (37.3 C), temperature source Oral, resp. rate 20, height _0  (1.575 m), weight 133 kg, SpO2 100 %.  Intake/Output Summary (Last 24 hours) at 10/29/2018 1433 Last data filed at 10/29/2018 1300 Gross per 24 hour  Intake 960 ml  Output -  Net 960 ml   Filed Weights   10/28/18 0400 10/29/18 0500 10/29/18 1350  Weight: 134.7 kg 134.6 kg 133 kg    Examination: General: No acute respiratory distress - alert and  interactive  Lungs: very distant BS th/o all fields Cardiovascular: distant HS w/o M or rub - RRR Abdomen: Obese, soft, bs+, no rebound, nontender  Extremities: trace B LE edema w/o change    CBC: Recent Labs  Lab 10/24/18 0244  10/26/18 0505 10/28/18 0632 10/29/18 0327  WBC 18.2*   < > 12.5* 10.8* 9.1  NEUTROABS 13.1*  --   --   --   --   HGB 7.2*   < > 7.0* 7.0* 7.1*  HCT 25.9*   < > 25.7* 25.0* 25.1*  MCV 90.6   < > 90.2 89.6 89.3  PLT 341   < > 361 280 275   < > = values in this interval not displayed.   Basic  Metabolic Panel: Recent Labs  Lab 10/27/18 0402 10/28/18 0632 10/29/18 0327  NA 135 136 138  K 4.0 4.3 4.5  CL 99 100 99  CO2 24 21* 25  GLUCOSE 129* 122* 101*  BUN 40* 64* 81*  CREATININE 4.07* 5.46* 6.62*  CALCIUM 9.0 9.6 9.9  MG 2.4 2.3 2.4  PHOS 4.8* 5.9* 7.2*   GFR: Estimated Creatinine Clearance: 12 mL/min (A) (by C-G formula based on SCr of 6.62 mg/dL (H)).  Liver Function Tests: Recent Labs  Lab 10/26/18 0506 10/27/18 0402 10/28/18 0632 10/29/18 0327  ALBUMIN 1.9* 1.9* 2.0* 2.0*    Coagulation Profile: Recent Labs  Lab 10/24/18 0243  INR 1.19    HbA1C: Hgb A1c MFr Bld  Date/Time Value Ref Range Status  10/23/2016 02:54 PM 8.0 (H) 4.8 - 5.6 % Final    Comment:    (NOTE)         Pre-diabetes: 5.7 - 6.4         Diabetes: >6.4         Glycemic control for adults with diabetes: <7.0   07/17/2015 02:09 AM 9.4 (H) 4.8 - 5.6 % Final    Comment:    (NOTE)         Pre-diabetes: 5.7 - 6.4         Diabetes: >6.4         Glycemic control for adults with diabetes: <7.0     CBG: Recent Labs  Lab 10/28/18 2034 10/28/18 2357 10/29/18 0404 10/29/18 0811 10/29/18 1230  GLUCAP 124* 89 101* 94 112*    Scheduled Meds: . aspirin  81 mg Per Tube Daily  . chlorhexidine gluconate (MEDLINE KIT)  15 mL Mouth Rinse BID  . Chlorhexidine Gluconate Cloth  6 each Topical Q0600  . feeding supplement (PRO-STAT SUGAR FREE 64)  30 mL Per Tube TID  . heparin      . heparin  4.2 mL Intracatheter Once  . heparin injection (subcutaneous)  5,000 Units Subcutaneous Q8H  . insulin aspart  0-20 Units Subcutaneous Q4H  . insulin glargine  30 Units Subcutaneous Daily  . ipratropium-albuterol  3 mL Nebulization Q6H  . mouth rinse  15 mL Mouth Rinse 10 times per day  . metoprolol tartrate  12.5 mg Per NG tube BID  . multivitamin  15 mL Per Tube Daily  . pantoprazole sodium  40 mg Per Tube Daily  . rosuvastatin  10 mg Per Tube QPM     LOS: 22 days   Cherene Altes,  MD [facility] Office  [phone] Pager - Text Page per Amion  If 7PM-7AM, please contact night-coverage per Amion 10/29/2018, 2:33 PM

## 2018-10-29 NOTE — Progress Notes (Signed)
PT Cancellation Note  Patient Details Name: DEBRAANN LIVINGSTONE MRN: [id number] DOB: 04-29-1961   Cancelled Treatment:    Reason Eval/Treat Not Completed: Patient at procedure or test/unavailable. Pt undergoing HD in room. PT to re-attempt as time allows.    Lorriane Shire 10/29/2018, 1:04 PM   Lorrin Goodell, PT  Office # [phone] Pager [phone]

## 2018-10-29 NOTE — Procedures (Signed)
Cortrak  Person Inserting Tube:  Catherine Williams, RD Tube Type:  Cortrak - 43 inches Tube Location:  Right nare Initial Placement:  Postpyloric Secured by: Bridle Technique Used to Measure Tube Placement:  Documented cm marking at nare/ corner of mouth Cortrak Secured At:  95 cm   No x-ray is required. RN may begin using tube.   If the tube becomes dislodged please keep the tube and contact the Cortrak team at [email or web address] (password [email or web address]) for replacement.  If after hours and replacement cannot be delayed, place a NG tube and confirm placement with an abdominal x-ray.    Catherine Williams RD, LDN Clinical Nutrition Pager # [phone]

## 2018-10-30 ENCOUNTER — Inpatient Hospital Stay (HOSPITAL_COMMUNITY): Payer: Medicare Other

## 2018-10-30 LAB — CBC
HCT: 22.4 % — ABNORMAL LOW (ref 36.0–46.0)
HEMATOCRIT: 25.4 % — AB (ref 36.0–46.0)
HEMOGLOBIN: 6.3 g/dL — AB (ref 12.0–15.0)
HEMOGLOBIN: 7.1 g/dL — AB (ref 12.0–15.0)
MCH: 25.3 pg — AB (ref 26.0–34.0)
MCH: 25.2 pg — AB (ref 26.0–34.0)
MCHC: 28.1 g/dL — ABNORMAL LOW (ref 30.0–36.0)
MCHC: 28 g/dL — ABNORMAL LOW (ref 30.0–36.0)
MCV: 90 fL (ref 80.0–100.0)
MCV: 90.1 fL (ref 80.0–100.0)
NRBC: 0.3 % — AB (ref 0.0–0.2)
Platelets: 212 10*3/uL (ref 150–400)
Platelets: 220 10*3/uL (ref 150–400)
RBC: 2.49 MIL/uL — AB (ref 3.87–5.11)
RBC: 2.82 MIL/uL — ABNORMAL LOW (ref 3.87–5.11)
RDW: 17.2 % — ABNORMAL HIGH (ref 11.5–15.5)
RDW: 16.6 % — ABNORMAL HIGH (ref 11.5–15.5)
WBC: 10.4 10*3/uL (ref 4.0–10.5)
WBC: 10.8 10*3/uL — AB (ref 4.0–10.5)
nRBC: 0.3 % — ABNORMAL HIGH (ref 0.0–0.2)

## 2018-10-30 LAB — GLUCOSE, CAPILLARY
GLUCOSE-CAPILLARY: 106 mg/dL — AB (ref 70–99)
GLUCOSE-CAPILLARY: 107 mg/dL — AB (ref 70–99)
GLUCOSE-CAPILLARY: 111 mg/dL — AB (ref 70–99)
GLUCOSE-CAPILLARY: 112 mg/dL — AB (ref 70–99)
Glucose-Capillary: 158 mg/dL — ABNORMAL HIGH (ref 70–99)
Glucose-Capillary: 108 mg/dL — ABNORMAL HIGH (ref 70–99)
Glucose-Capillary: 115 mg/dL — ABNORMAL HIGH (ref 70–99)

## 2018-10-30 LAB — RENAL FUNCTION PANEL
ALBUMIN: 1.9 g/dL — AB (ref 3.5–5.0)
ANION GAP: 12 (ref 5–15)
BUN: 49 mg/dL — AB (ref 6–20)
CHLORIDE: 96 mmol/L — AB (ref 98–111)
CO2: 27 mmol/L (ref 22–32)
Calcium: 9.2 mg/dL (ref 8.9–10.3)
Creatinine, Ser: 4.26 mg/dL — ABNORMAL HIGH (ref 0.44–1.00)
GFR, EST AFRICAN AMERICAN: 12 mL/min — AB (ref >60)
GFR, EST NON AFRICAN AMERICAN: 10 mL/min — AB (ref >60)
Glucose, Bld: 129 mg/dL — ABNORMAL HIGH (ref 70–99)
PHOSPHORUS: 4 mg/dL (ref 2.5–4.6)
POTASSIUM: 3.7 mmol/L (ref 3.5–5.1)
Sodium: 135 mmol/L (ref 135–145)

## 2018-10-30 LAB — PREPARE RBC (CROSSMATCH)

## 2018-10-30 LAB — APTT: aPTT: 33 seconds (ref 24–36)

## 2018-10-30 LAB — MAGNESIUM: MAGNESIUM: 2.2 mg/dL (ref 1.7–2.4)

## 2018-10-30 MED ORDER — SODIUM CHLORIDE 0.9% IV SOLUTION
Freq: Once | INTRAVENOUS | Status: AC
  Administered 2018-10-30: 07:00:00 via INTRAVENOUS

## 2018-10-30 MED ORDER — DIPHENHYDRAMINE HCL 50 MG/ML IJ SOLN
12.5000 mg | Freq: Four times a day (QID) | INTRAMUSCULAR | Status: DC | PRN
  Administered 2018-10-30: 12.5 mg via INTRAVENOUS
  Filled 2018-10-30: qty 1

## 2018-10-30 MED ORDER — SODIUM CHLORIDE 0.9% IV SOLUTION: Freq: Once | INTRAVENOUS | Status: DC

## 2018-10-30 NOTE — Progress Notes (Addendum)
[facility] Progress Note Patient Name: Catherine Williams DOB: 1961/03/09 MRN: [id number]   Date of Service  10/30/2018  HPI/Events of Note  Multiple issues: 1. Anemia - Hgb = 6.3 and 2. Patient c/o itching.   eICU Interventions  Will order: 1. Transfuse 1 unit PRBC now.  2. Benadryl 12.5 mg IV Q 6 hours PRN itching.     Intervention Category Major Interventions: Other:  Lysle Dingwall 10/30/2018, 4:17 AM

## 2018-10-30 NOTE — Progress Notes (Signed)
[place] TEAM 1 - Stepdown/ICU TEAM  Catherine Williams  DXA:[id number] DOB: July 10, 1961 DOA: 10/07/2018 PCP: Vonna Drafts, FNP    Brief Narrative:  57yo F w/ a hx of obesity, OSA, and CKD stage V who was admitted 10/20 after a PEA cardiac arrest. Underwent hypothermia protocol andstarted on CRRT. On  10/26 she sustained a brief PEA arrest related to mucous plugging,and underwent bronchoscopy. Again on 10/30 she suffered another brief resp/PEA arrest w/ mucus plugging. A second bronchoscopy noted minimal blood + mucus suctioned out.  Significant Events: 10/20 ED s/p Cardiac Arrest - hypothermia protocol 10/22 rewarmed at 1pm 10/25 very sedate - minimally responsive 10/26 2 min PEA arrest with x 1 epi > mucus plugging in ET per cronch 10/26 CRRT overnight - levophed gtt  10/27 Off levophed 10/30 second PEA arrest > mucus plugging - bronch unimpressive 11/1 trach - PEA/brady arrest during iHD - emergent cardiac cath neg 11/6 CRRT stopped 11/8 permacath placed in IR > iHD 11/9 vaginal bleeding dark blood  11/10 TRH assume care   Subjective: Pt's vaginal bleeding increased over night, with tennis ball sized clots noted by staff. Pt herself denies abdom pain, or sob. She is in good spirits.   Assessment & Plan:  Recurring PEA arrest  ?due to mucous plugging - etiology not fully clear - cardiac cath unremarkable - appears stable at this time   Recurrent acute respiratory arrest  Now trach/vent dependent - weaning per PCCM - appears she will benefit from [facility]  COPD Well compensated at this time  OSA on CPAP at HS previously   Decompensated Diastolic Heart Failure  EF 55-60, G1DD - volume management per HD - wgt stable  Filed Weights   10/29/18 1350 10/29/18 1758 10/30/18 0500  Weight: 133 kg 132 kg 130 kg    Acute on Chronic Stage IV Kidney Disease - anuric  recent placement of UE Fistula - now requiring iHD per Nephrology    Multifactorial anemia acute blood loss  w/ vaginal bleeding + anemia of CKD + anemia of critical illness - epo and Fe per Nephrology - s/p 2U PRBC this admit - follow trend   Recent Labs  Lab 10/25/18 0640 10/26/18 0505 10/28/18 0632 10/29/18 0327 10/30/18 0247  HGB 6.5* 7.0* 7.0* 7.1* 6.3*    ?HCAP + Serratia UTI Cefepime through 11/10 (~total 14 days) - no active signs of infection presently   DM CBG controlled   Vaginal bleeding / Dysfunctional uterine bleeding Unclear if this is a chronic issue or not - had a normal CT abdom for this indication in 2012 -  Check CT abd/pelvis today - may have to consider GYN consult if persists   Morbid obesity - Body mass index is 52.42 kg/m.  DVT prophylaxis: SCDs Code Status: FULL CODE Family Communication: no family present at time of exam  Disposition Plan: SDU  Consultants:  Cardiology PCCM  Antimicrobials:  Cefepime 10/28  >10/31 + 11/3 > 11/10 Ceftx 10/31 > 11/2  Objective: Blood pressure (!) 94/27, pulse 82, temperature 99.8 F (37.7 C), temperature source Oral, resp. rate (!) 24, height 5' 2"  (1.575 m), weight 130 kg, SpO2 100 %.  Intake/Output Summary (Last 24 hours) at 10/30/2018 1109 Last data filed at 10/30/2018 0845 Gross per 24 hour  Intake 1075 ml  Output 1000 ml  Net 75 ml   Filed Weights   10/29/18 1350 10/29/18 1758 10/30/18 0500  Weight: 133 kg 132 kg 130 kg    Examination:  General: No acute respiratory distress  Lungs: very distant BS th/o all fields - no wheezing  Cardiovascular: distant HS w/o M or rub - RRR  Abdomen: Obese, soft, bs+, nontender  Extremities: trace B LE edema     CBC: Recent Labs  Lab 10/24/18 0244  10/28/18 0632 10/29/18 0327 10/30/18 0247  WBC 18.2*   < > 10.8* 9.1 10.4  NEUTROABS 13.1*  --   --   --   --   HGB 7.2*   < > 7.0* 7.1* 6.3*  HCT 25.9*   < > 25.0* 25.1* 22.4*  MCV 90.6   < > 89.6 89.3 90.0  PLT 341   < > 280 275 212   < > = values in this interval not displayed.   Basic Metabolic  Panel: Recent Labs  Lab 10/28/18 0632 10/29/18 0327 10/30/18 0247  NA 136 138 135  K 4.3 4.5 3.7  CL 100 99 96*  CO2 21* 25 27  GLUCOSE 122* 101* 129*  BUN 64* 81* 49*  CREATININE 5.46* 6.62* 4.26*  CALCIUM 9.6 9.9 9.2  MG 2.3 2.4 2.2  PHOS 5.9* 7.2* 4.0   GFR: Estimated Creatinine Clearance: 18.4 mL/min (A) (by C-G formula based on SCr of 4.26 mg/dL (H)).  Liver Function Tests: Recent Labs  Lab 10/27/18 0402 10/28/18 0632 10/29/18 0327 10/30/18 0247  ALBUMIN 1.9* 2.0* 2.0* 1.9*    Coagulation Profile: Recent Labs  Lab 10/24/18 0243  INR 1.19    HbA1C: Hgb A1c MFr Bld  Date/Time Value Ref Range Status  10/23/2016 02:54 PM 8.0 (H) 4.8 - 5.6 % Final    Comment:    (NOTE)         Pre-diabetes: 5.7 - 6.4         Diabetes: >6.4         Glycemic control for adults with diabetes: <7.0   07/17/2015 02:09 AM 9.4 (H) 4.8 - 5.6 % Final    Comment:    (NOTE)         Pre-diabetes: 5.7 - 6.4         Diabetes: >6.4         Glycemic control for adults with diabetes: <7.0     CBG: Recent Labs  Lab 10/29/18 1537 10/29/18 2059 10/29/18 2347 10/30/18 0405 10/30/18 0816  GLUCAP 111* 142* 106* 112* 158*    Scheduled Meds: . chlorhexidine gluconate (MEDLINE KIT)  15 mL Mouth Rinse BID  . Chlorhexidine Gluconate Cloth  6 each Topical Q0600  . feeding supplement (PRO-STAT SUGAR FREE 64)  30 mL Per Tube TID  . heparin injection (subcutaneous)  5,000 Units Subcutaneous Q8H  . insulin aspart  0-20 Units Subcutaneous Q4H  . insulin glargine  30 Units Subcutaneous Daily  . ipratropium-albuterol  3 mL Nebulization Q6H  . mouth rinse  15 mL Mouth Rinse 10 times per day  . metoprolol tartrate  12.5 mg Per NG tube BID  . multivitamin  15 mL Per Tube Daily  . pantoprazole sodium  40 mg Per Tube Daily  . rosuvastatin  10 mg Per Tube QPM     LOS: 23 days   Cherene Altes, MD [facility] Office  [phone] Pager - Text Page per Amion  If 7PM-7AM, please  contact night-coverage per Amion 10/30/2018, 11:09 AM

## 2018-10-30 NOTE — Treatment Plan (Cosign Needed)
SLP and respiratory therapist will assess pt for in-line [place] Speaking Valve at 1:00PM.   Jettie Booze, Student SLP

## 2018-10-30 NOTE — Progress Notes (Signed)
Physical Therapy Treatment Patient Details Name: Catherine Williams MRN: [id number] DOB: [birth year] Today's Date: 10/30/2018    History of Present Illness Pt is a 57 y.o. F with significant PMH of obesity with OSA and CKD stage V admitted on 10/20 after PEA cardiac arrest and intubated. Underwent hypothermia protocol and started on CRRT. 10/26 sustained another brief PEA arrest and underwent bronchoscopy. Had 2 more arrests on 10/30 and 11/1 post tracheostomy placement, cardiac cath negative for obstructive CAD. Also has sacral stage 2 ulcer.    PT Comments    Patient tolerated extended time at EOB this session. Performed assisted functional tasks and exercise activities with use of music. Patient appears in better spirits and tolerated well. Continues to required max to total assist 2+ persons. Current POC remains appropriate.   Follow Up Recommendations  LTACH     Equipment Recommendations  Other (comment)(defer)    Recommendations for Other Services OT consult     Precautions / Restrictions Precautions Precautions: Fall Precaution Comments: Vent Restrictions Weight Bearing Restrictions: No    Mobility  Bed Mobility Overal bed mobility: Needs Assistance Bed Mobility: Rolling;Sidelying to Sit;Sit to Supine Rolling: +2 for physical assistance;Total assist   Supine to sit: Max assist(+3) Sit to supine: Total assist(+3)   General bed mobility comments: Patient with limited ability to enage in activity, some reaching and attempts to move LEs.  Transfers                 General transfer comment: deferred  Ambulation/Gait                 Stairs             Wheelchair Mobility    Modified Rankin (Stroke Patients Only)       Balance Overall balance assessment: Needs assistance Sitting-balance support: Bilateral upper extremity supported;Feet supported Sitting balance-Leahy Scale: Poor Sitting balance - Comments: max assist for functional task  performance, but able to progress to min guard. Toelrated dynamic activities in sitting (see exercise) Postural control: Posterior lean                                  Cognition Arousal/Alertness: Lethargic(initially lethargic) Behavior During Therapy: Flat affect(will smile when prompted) Overall Cognitive Status: Difficult to assess                                 General Comments: Very pleasant and participatory in therapy. Following simple commands > 75% of the time.      Exercises Other Exercises Other Exercises: Static sitt Other Exercises: dynamic UE activities in conjunction with music intervention at EOB Other Exercises: Reaching and kicking bilateral extremities Other Exercises: AAROM bilateral LEs    General Comments        Pertinent Vitals/Pain Pain Assessment: Faces Faces Pain Scale: Hurts even more Pain Location: neck with gentle ROM, trach site Pain Descriptors / Indicators: Grimacing Pain Intervention(s): Monitored during session    Home Living                      Prior Function            PT Goals (current goals can now be found in the care plan section) Acute Rehab PT Goals PT Goal Formulation: With patient Time For Goal Achievement: 11/07/18 Potential to Achieve Goals: Fair Progress  towards PT goals: Progressing toward goals    Frequency    Min 2X/week      PT Plan Current plan remains appropriate    Co-evaluation PT/OT/SLP Co-Evaluation/Treatment: Yes Reason for Co-Treatment: For patient/therapist safety PT goals addressed during session: Mobility/safety with mobility OT goals addressed during session: Strengthening/ROM;ADL's and self-care      AM-PAC PT "6 Clicks" Daily Activity  Outcome Measure  Difficulty turning over in bed (including adjusting bedclothes, sheets and blankets)?: Unable Difficulty moving from lying on back to sitting on the side of the bed? : Unable Difficulty sitting down  on and standing up from a chair with arms (e.g., wheelchair, bedside commode, etc,.)?: Unable Help needed moving to and from a bed to chair (including a wheelchair)?: Total Help needed walking in hospital room?: Total Help needed climbing 3-5 steps with a railing? : Total 6 Click Score: 6    End of Session Equipment Utilized During Treatment: Oxygen Activity Tolerance: Patient limited by fatigue Patient left: in bed;with call bell/phone within reach Nurse Communication: Mobility status PT Visit Diagnosis: Other abnormalities of gait and mobility (R26.89);Muscle weakness (generalized) (M62.81)     Time: 4360-6770 PT Time Calculation (min) (ACUTE ONLY): 24 min  Charges:  $Therapeutic Activity: 8-22 mins                     Alben Deeds, PT DPT  Board Certified Neurologic Specialist [place] Pager [phone] Office [phone]    Duncan Dull 10/30/2018, 12:37 PM

## 2018-10-30 NOTE — Progress Notes (Signed)
S: No complaints this morning and tolerated HD well yesterday O:BP (!) 94/27   Pulse 82   Temp 99.8 F (37.7 C) (Oral)   Resp (!) 24   Ht _0  (1.575 m)   Wt 130 kg   SpO2 100%   BMI 52.42 kg/m   Intake/Output Summary (Last 24 hours) at 10/30/2018 1132 Last data filed at 10/30/2018 1100 Gross per 24 hour  Intake 1195 ml  Output 1000 ml  Net 195 ml   Intake/Output: I/O last 3 completed shifts: In: 1560 [NG/GT:1560] Out: 1000 [Other:1000]  Intake/Output this shift:  Total I/O In: 475 [Blood:315; NG/GT:160] Out: -  Weight change: -1.6 kg Gen: NAD CVS: no rub Resp: cta Abd: obese Ext: trace edema, LUE AVF +T/B  Recent Labs  Lab 10/24/18 0304 10/25/18 0416 10/26/18 0506 10/27/18 0402 10/28/18 0632 10/29/18 0327 10/30/18 0247  NA 135 136 136 135 136 138 135  K 4.6 4.6 4.7 4.0 4.3 4.5 3.7  CL 102 103 100 99 100 99 96*  CO2 24 23 21* 24 21* 25 27  GLUCOSE 141* 125* 105* 129* 122* 101* 129*  BUN 44* 56* 66* 40* 64* 81* 49*  CREATININE 2.66* 3.99* 5.48* 4.07* 5.46* 6.62* 4.26*  ALBUMIN 2.1* 1.9* 1.9* 1.9* 2.0* 2.0* 1.9*  CALCIUM 9.5 10.1 9.9 9.0 9.6 9.9 9.2  PHOS 3.2 5.1* 6.4* 4.8* 5.9* 7.2* 4.0   Liver Function Tests: Recent Labs  Lab 10/28/18 0632 10/29/18 0327 10/30/18 0247  ALBUMIN 2.0* 2.0* 1.9*   No results for input(s): LIPASE, AMYLASE in the last 168 hours. No results for input(s): AMMONIA in the last 168 hours. CBC: Recent Labs  Lab 10/24/18 0244 10/25/18 0640 10/26/18 0505 10/28/18 0632 10/29/18 0327 10/30/18 0247  WBC 18.2* 16.8* 12.5* 10.8* 9.1 10.4  NEUTROABS 13.1*  --   --   --   --   --   HGB 7.2* 6.5* 7.0* 7.0* 7.1* 6.3*  HCT 25.9* 24.3* 25.7* 25.0* 25.1* 22.4*  MCV 90.6 92.4 90.2 89.6 89.3 90.0  PLT 341 334 361 280 275 212   Cardiac Enzymes: No results for input(s): CKTOTAL, CKMB, CKMBINDEX, TROPONINI in the last 168 hours. CBG: Recent Labs  Lab 10/29/18 1537 10/29/18 2059 10/29/18 2347 10/30/18 0405 10/30/18 0816   GLUCAP 111* 142* 106* 112* 158*    Iron Studies: No results for input(s): IRON, TIBC, TRANSFERRIN, FERRITIN in the last 72 hours. Studies/Results: No results found. . chlorhexidine gluconate (MEDLINE KIT)  15 mL Mouth Rinse BID  . Chlorhexidine Gluconate Cloth  6 each Topical Q0600  . feeding supplement (PRO-STAT SUGAR FREE 64)  30 mL Per Tube TID  . heparin injection (subcutaneous)  5,000 Units Subcutaneous Q8H  . insulin aspart  0-20 Units Subcutaneous Q4H  . insulin glargine  30 Units Subcutaneous Daily  . ipratropium-albuterol  3 mL Nebulization Q6H  . mouth rinse  15 mL Mouth Rinse 10 times per day  . multivitamin  15 mL Per Tube Daily  . pantoprazole sodium  40 mg Per Tube Daily  . rosuvastatin  10 mg Per Tube QPM    BMET    Component Value Date/Time   NA 135 10/30/2018 0247   K 3.7 10/30/2018 0247   CL 96 (L) 10/30/2018 0247   CO2 27 10/30/2018 0247   GLUCOSE 129 (H) 10/30/2018 0247   BUN 49 (H) 10/30/2018 0247   CREATININE 4.26 (H) 10/30/2018 0247   CALCIUM 9.2 10/30/2018 0247   CALCIUM 9.7 08/01/2018 1113  GFRNONAA 10 (L) 10/30/2018 0247   GFRAA 12 (L) 10/30/2018 0247   CBC    Component Value Date/Time   WBC 10.4 10/30/2018 0247   RBC 2.49 (L) 10/30/2018 0247   HGB 6.3 (LL) 10/30/2018 0247   HCT 22.4 (L) 10/30/2018 0247   PLT 212 10/30/2018 0247   MCV 90.0 10/30/2018 0247   MCH 25.3 (L) 10/30/2018 0247   MCHC 28.1 (L) 10/30/2018 0247   RDW 17.2 (H) 10/30/2018 0247   LYMPHSABS 2.4 10/24/2018 0244   MONOABS 1.4 (H) 10/24/2018 0244   EOSABS 0.3 10/24/2018 0244   BASOSABS 0.1 10/24/2018 0244    Assessment/Plan:  1. AKI/CKD stage 4 in setting of cardiac arrest and has now been dialysis dependent since 10/12/18 (initially on CRRT until 10/30 and transitioned to IHD but complicated by PEA arrest x2).  Tolerated last IHD and plan for HD tomorrow and cont with MWF schedule.  2. V fib/PEA arrest- cardiac cath without findings for intervention. Has had  multiple arrests to date, no clear etiology 3. Vascular access- [facility] TDC placed 10/26/18 and LUE AVF  4. Hypotension- may need midodrine for HD prn 5. Anemia of ckd and ABLA due to vaginal bleed.  On Aranesp, but dropped to 6.3, would recommend transfusing 2 units.  Can give one today and second on Hd tomorrow. 6. VDRF- s/p trach per PCCM 7. Vaginal bleeding- consider gyn consult. 8. Disposition- will need LTC placement   Donetta Potts, MD [facility] [phone]

## 2018-10-30 NOTE — Progress Notes (Signed)
Occupational Therapy Treatment Patient Details Name: Catherine Williams MRN: [id number] DOB: [birth year] Today's Date: 10/30/2018    History of present illness Pt is a 57 y.o. F with significant PMH of obesity with OSA and CKD stage V admitted on 10/20 after PEA cardiac arrest and intubated. Underwent hypothermia protocol and started on CRRT. 10/26 sustained another brief PEA arrest and underwent bronchoscopy. Had 2 more arrests on 10/30 and 11/1 post tracheostomy placement, cardiac cath negative for obstructive CAD. Also has sacral stage 2 ulcer.   OT comments  Upon arrival, pt sleeping and supine in bed. Pt agreeable to sitting at EOB for grooming and exercises. Pt performing oral care at EOB with Max A for sitting balance. Pt participating in BUE/BLE AROM with use of music. Pt requiring Max-Total A +2 for bed mobility. VSS. Continue to recommend post-acute rehab and will continue to follow acutely as admitted.    Follow Up Recommendations  LTACH;Supervision/Assistance - 24 hour    Equipment Recommendations  Other (comment)(Defer to next venue)    Recommendations for Other Services      Precautions / Restrictions Precautions Precautions: Fall Precaution Comments: Vent Restrictions Weight Bearing Restrictions: No       Mobility Bed Mobility Overal bed mobility: Needs Assistance Bed Mobility: Rolling;Sidelying to Sit;Sit to Supine Rolling: +2 for physical assistance;Total assist   Supine to sit: Max assist;+2 for physical assistance;+2 for safety/equipment;HOB elevated Sit to supine: Total assist;+2 for physical assistance;+2 for safety/equipment   General bed mobility comments: Patient with limited ability to enage in activity, some reaching and attempts to move LEs.  Transfers                 General transfer comment: deferred    Balance Overall balance assessment: Needs assistance Sitting-balance support: Bilateral upper extremity supported;Feet supported Sitting  balance-Leahy Scale: Poor Sitting balance - Comments: max assist for functional task performance, but able to progress to min guard. Toelrated dynamic activities in sitting (see exercise) Postural control: Posterior lean                                 ADL either performed or assessed with clinical judgement   ADL Overall ADL's : Needs assistance/impaired     Grooming: Oral care;Maximal assistance;Sitting Grooming Details (indicate cue type and reason): During ADL activity at EOB, pt requiring Max A for sitting balance. Pt performing oral care with cues and increased time.                                General ADL Comments: Pt participating in oral care at EOB with Max A. Pt participating in exercises at EOB as well     Vision       Perception     Praxis      Cognition Arousal/Alertness: Lethargic(initially lethargic) Behavior During Therapy: Flat affect(will smile when prompted) Overall Cognitive Status: Difficult to assess                                 General Comments: Very pleasant and participatory in therapy. Following simple commands > 75% of the time.        Exercises Exercises: Other exercises Other Exercises Other Exercises: Static sitt Other Exercises: dynamic UE activities in conjunction with music intervention at EOB Other Exercises: Reaching  and kicking bilateral extremities Other Exercises: AAROM bilateral LEs   Shoulder Instructions       General Comments      Pertinent Vitals/ Pain       Pain Assessment: Faces Faces Pain Scale: Hurts even more Pain Location: neck with gentle ROM, trach site Pain Descriptors / Indicators: Grimacing Pain Intervention(s): Monitored during session;Limited activity within patient's tolerance;Repositioned  Home Living                                          Prior Functioning/Environment              Frequency  Min 2X/week        Progress  Toward Goals  OT Goals(current goals can now be found in the care plan section)  Progress towards OT goals: Progressing toward goals  Acute Rehab OT Goals Patient Stated Goal: "go to [employer]." OT Goal Formulation: Patient unable to participate in goal setting Time For Goal Achievement: 11/08/18 Potential to Achieve Goals: Fair ADL Goals Pt Will Perform Grooming: with mod assist;sitting Pt Will Perform Upper Body Dressing: with mod assist;sitting Pt Will Transfer to Toilet: with +2 assist;with mod assist;stand pivot transfer;bedside commode Pt/caregiver will Perform Home Exercise Program: Increased strength;Both right and left upper extremity(A/AROM) Additional ADL Goal #1: Pt will perform bed mobility with +2 moderate assistance in preparation for ADL.  Plan Discharge plan remains appropriate    Co-evaluation    PT/OT/SLP Co-Evaluation/Treatment: Yes Reason for Co-Treatment: Complexity of the patient's impairments (multi-system involvement);Necessary to address cognition/behavior during functional activity;For patient/therapist safety;To address functional/ADL transfers PT goals addressed during session: Mobility/safety with mobility OT goals addressed during session: ADL's and self-care      AM-PAC PT "6 Clicks" Daily Activity     Outcome Measure   Help from another person eating meals?: Total Help from another person taking care of personal grooming?: A Lot Help from another person toileting, which includes using toliet, bedpan, or urinal?: Total Help from another person bathing (including washing, rinsing, drying)?: Total Help from another person to put on and taking off regular upper body clothing?: Total Help from another person to put on and taking off regular lower body clothing?: Total 6 Click Score: 7    End of Session Equipment Utilized During Treatment: Oxygen(Vent)  OT Visit Diagnosis: Pain;Muscle weakness (generalized) (M62.81)   Activity Tolerance Patient  limited by lethargy;Patient limited by fatigue   Patient Left in bed;with call bell/phone within reach   Nurse Communication Mobility status        Time: 9735-3299 OT Time Calculation (min): 30 min  Charges: OT General Charges $OT Visit: 1 Visit OT Treatments $Self Care/Home Management : 23-37 mins  [place], OTR/L Acute Rehab Pager: [phone] Office: [place] 10/30/2018, 1:55 PM

## 2018-10-30 NOTE — Progress Notes (Signed)
Transported to and from CT with RT, transport, and additional RN without event. Pt resting comfortably in bed, linens changed, VSS. Will continue to monitor patient closely.

## 2018-10-30 NOTE — Evaluation (Signed)
Passy-Muir Speaking Valve - Evaluation Patient Details  Name: Catherine Williams MRN: [id number] Date of Birth: 23-Nov-1961  Today's Date: 10/30/2018 Time: 1319-1340 SLP Time Calculation (min) (ACUTE ONLY): 21 min  Past Medical History:  Past Medical History:  Diagnosis Date  . Anemia   . Arthritis Dec. 2014   Gout  . Asthma   . CHF (congestive heart failure) ([place])   . COPD (chronic obstructive pulmonary disease) ([place])   . Diabetes mellitus   . GERD (gastroesophageal reflux disease)   . Heart failure   . Hyperlipidemia   . Hypertension   . Kidney disease   . Morbid obesity ([place])   . Peripheral vascular disease ([place])   . Pinched nerve   . Requires supplemental oxygen    as needed  . Sleep apnea    wears cpap  . Wears dentures    Past Surgical History:  Past Surgical History:  Procedure Laterality Date  . Atherectomy and angioplasty  10/18/2011   left posterior tibial artery  . AV FISTULA PLACEMENT Left 10/04/2018   Procedure: ARTERIOVENOUS (AV) FISTULA CREATION LEFT ARM;  Surgeon: Serafina Mitchell, MD;  Location: [facility];  Service: Vascular;  Laterality: Left;  . COLONOSCOPY    . HAND SURGERY     right  . IR FLUORO GUIDE CV LINE LEFT  10/26/2018  . IR US GUIDE VASC ACCESS LEFT  10/26/2018  . LEFT HEART CATH AND CORONARY ANGIOGRAPHY N/A 10/19/2018   Procedure: LEFT HEART CATH AND CORONARY ANGIOGRAPHY;  Surgeon: Leonie Man, MD;  Location: [facility];  Service: Cardiovascular;  Laterality: N/A;  . MULTIPLE TOOTH EXTRACTIONS    . OVARY SURGERY     Left  . TOE AMPUTATION  Sept. 25,2012   Left 4th and 5th toes   HPI:  Pt is a 57 y.o. female with PMH significant for OSA, morbid obesity, GERD, COPD, asthma, DM, HTN, CHF, CKD, and recent placement of LAVF on 10/04/18. She [facility] ED 10/07/18 following sudden cardiac arrest witnessed by family at home. Per chart, pt has had multiple, recurrent episodes of PEA arrest during this admission. ETT 10/07/18 with trach  placement 10/19/18. Chest CT 10/25/18 showed dependent atelectasis in the lower lobes, patchy areas of ground-glass opacity most evident in the upper lobes, no evidence of pulmonary edema. Head CT no acute findings.    Assessment / Plan / Recommendation Clinical Impression  Pt seen by SLP and Respiratory Therapist for in-line Passy Muir Speaking Valve (PMSV) evaluation today on PRVC setting. She tolerated tracheostomy suctioning and slow cuff deflation very well. Pt's exhaled volume did not decrease significantly as expected and reduced upper airway patency was suspected. RT reported cuff insufficiently inflated at baseline in addition to increased pharyngeal adipose tissue accounting for static Vte (exhaled volume) reading . With PMSV placed for approximately 12 minutes, pt achieved limited instances of true phonation; although vocal quality was hoarse and low in intensity, pt was responsive to questions and remained approximately 50-60% intelligible at the sentence level. Pt most intelligible when cued by SLP to take deep inhalations prior to speaking, use slow rate and exaggerated articulatory movements. Vital signs remained stable without observable signs of distress from pt during assessment or indications of air trapping. Recommend PMSV use only with SLP at this time; ST will continue to follow to provide treatment with PMSV and to determine appropriateness for more extended use.  SLP Visit Diagnosis: Aphonia (R49.1)    SLP Assessment  Patient needs  continued Speech Lanaguage Pathology Services    Follow Up Recommendations  LTACH    Frequency and Duration min 2x/week  2 weeks    PMSV Trial PMSV was placed for: 12 min Able to redirect subglottic air through upper airway: Yes Able to Attain Phonation: Yes(limited ability) Voice Quality: Hoarse;Low vocal intensity Able to Expectorate Secretions: Yes Level of Secretion Expectoration with PMSV: Oral Breath Support for Phonation: Moderately  decreased Intelligibility: Intelligibility reduced Word: 75-100% accurate Phrase: 50-74% accurate Sentence: 75-100% accurate Conversation: 50-74% accurate Respirations During Trial: (28-32) SpO2 During Trial: 100 % Behavior: Alert;Controlled;Cooperative;Responsive to questions   Tracheostomy Tube       Vent Dependency  Vent Dependent: Yes Vent Mode: PRVC Set Rate: 20 bmp PEEP: 5 cmH20 FiO2 (%): 30 % Vt Set: 400 mL    Cuff Deflation Trial  GO Tolerated Cuff Deflation: Yes Length of Time for Cuff Deflation Trial: 20 min Behavior: Alert;Cooperative;Responsive to questions;Quiet;Controlled        Houston Siren 10/30/2018, 2:25 PM   Orbie Pyo Colvin Caroli.Ed Risk analyst [phone] Office [phone]

## 2018-10-30 NOTE — Progress Notes (Signed)
Transported pt to and from CT without event. RT will continue to monitor as needed.

## 2018-10-30 NOTE — Progress Notes (Signed)
CRITICAL VALUE ALERT  Critical Value:  Hemoglobin 6.3, pt also complaints of itching.  Date & Time Notied:  10/30/18 4:00 AM    Provider Notified: Warren Lacy RN, Abby  Orders Received/Actions taken: Awaiting new orders.

## 2018-10-30 NOTE — Telephone Encounter (Signed)
[place] and spoke with Charmia stating to her the information from [employer] in regards to the FMLA paperwork that pt's daughter was requesting in regards to pt.  Per Charmia, she was not able to locate the pt or her daughter in their system, even by trying pt's last four digits of social security number.  Attempted to call pt but unable to reach her. Left message for pt to return call.

## 2018-10-30 NOTE — Progress Notes (Signed)
Called regarding patient's pre-blood admin temp 100.4. Orders to give PRN tylenol. Will continue to remain with patient 15 minutes post start and continue to monitor patient closely.

## 2018-10-31 DIAGNOSIS — Z430 Encounter for attention to tracheostomy: Secondary | ICD-10-CM

## 2018-10-31 LAB — GLUCOSE, CAPILLARY
GLUCOSE-CAPILLARY: 135 mg/dL — AB (ref 70–99)
GLUCOSE-CAPILLARY: 117 mg/dL — AB (ref 70–99)
GLUCOSE-CAPILLARY: 141 mg/dL — AB (ref 70–99)
Glucose-Capillary: 109 mg/dL — ABNORMAL HIGH (ref 70–99)
Glucose-Capillary: 119 mg/dL — ABNORMAL HIGH (ref 70–99)
Glucose-Capillary: 122 mg/dL — ABNORMAL HIGH (ref 70–99)

## 2018-10-31 LAB — RENAL FUNCTION PANEL
Albumin: 1.9 g/dL — ABNORMAL LOW (ref 3.5–5.0)
Anion gap: 13 (ref 5–15)
BUN: 70 mg/dL — AB (ref 6–20)
CO2: 27 mmol/L (ref 22–32)
CREATININE: 5.48 mg/dL — AB (ref 0.44–1.00)
Calcium: 9.7 mg/dL (ref 8.9–10.3)
Chloride: 97 mmol/L — ABNORMAL LOW (ref 98–111)
GFR calc Af Amer: 9 mL/min — ABNORMAL LOW (ref >60)
GFR calc non Af Amer: 8 mL/min — ABNORMAL LOW (ref >60)
Glucose, Bld: 119 mg/dL — ABNORMAL HIGH (ref 70–99)
Phosphorus: 5.4 mg/dL — ABNORMAL HIGH (ref 2.5–4.6)
Potassium: 4.3 mmol/L (ref 3.5–5.1)
Sodium: 137 mmol/L (ref 135–145)

## 2018-10-31 LAB — CBC
HEMATOCRIT: 24.7 % — AB (ref 36.0–46.0)
HEMOGLOBIN: 7 g/dL — AB (ref 12.0–15.0)
MCH: 25.5 pg — ABNORMAL LOW (ref 26.0–34.0)
MCHC: 28.3 g/dL — AB (ref 30.0–36.0)
MCV: 89.8 fL (ref 80.0–100.0)
Platelets: 245 10*3/uL (ref 150–400)
RBC: 2.75 MIL/uL — AB (ref 3.87–5.11)
RDW: 16.8 % — ABNORMAL HIGH (ref 11.5–15.5)
WBC: 11 10*3/uL — AB (ref 4.0–10.5)
nRBC: 0.3 % — ABNORMAL HIGH (ref 0.0–0.2)

## 2018-10-31 LAB — APTT: APTT: 31 s (ref 24–36)

## 2018-10-31 LAB — MAGNESIUM: Magnesium: 2.2 mg/dL (ref 1.7–2.4)

## 2018-10-31 MED ORDER — HEPARIN SODIUM (PORCINE) 1000 UNIT/ML DIALYSIS: 1000.0000 [IU] | INTRAMUSCULAR | Status: DC | PRN

## 2018-10-31 MED ORDER — HEPARIN SODIUM (PORCINE) 1000 UNIT/ML IJ SOLN
INTRAMUSCULAR | Status: AC
  Filled 2018-10-31: qty 3

## 2018-10-31 MED ORDER — ALTEPLASE 2 MG IJ SOLR: 2.0000 mg | Freq: Once | INTRAMUSCULAR | Status: DC | PRN

## 2018-10-31 MED ORDER — PENTAFLUOROPROP-TETRAFLUOROETH EX AERO: 1.0000 "application " | INHALATION_SPRAY | CUTANEOUS | Status: DC | PRN

## 2018-10-31 MED ORDER — LIDOCAINE HCL (PF) 1 % IJ SOLN: 5.0000 mL | INTRAMUSCULAR | Status: DC | PRN

## 2018-10-31 MED ORDER — SODIUM CHLORIDE 0.9 % IV SOLN: 100.0000 mL | INTRAVENOUS | Status: DC | PRN

## 2018-10-31 MED ORDER — HEPARIN SODIUM (PORCINE) 1000 UNIT/ML IJ SOLN
INTRAMUSCULAR | Status: AC
  Filled 2018-10-31: qty 4

## 2018-10-31 MED ORDER — LIDOCAINE-PRILOCAINE 2.5-2.5 % EX CREA: 1.0000 "application " | TOPICAL_CREAM | CUTANEOUS | Status: DC | PRN

## 2018-10-31 MED ORDER — HEPARIN SODIUM (PORCINE) 1000 UNIT/ML DIALYSIS
20.0000 [IU]/kg | INTRAMUSCULAR | Status: DC | PRN
  Administered 2018-10-31: 2700 [IU] via INTRAVENOUS_CENTRAL

## 2018-10-31 NOTE — Procedures (Signed)
I was present at this dialysis session. I have reviewed the session itself and made appropriate changes.  She is currently receiving 2 units PRBC's with HD and BP has improved.   Vital signs in last 24 hours:  Temp:  [98.5 F (36.9 C)-99.9 F (37.7 C)] 98.5 F (36.9 C) (11/13 0815) Pulse Rate:  [76-88] 88 (11/13 0815) Resp:  [20-30] 28 (11/13 0815) BP: (94-140)/(27-116) 123/47 (11/13 0815) SpO2:  [93 %-100 %] 95 % (11/13 0815) FiO2 (%):  [30 %] 30 % (11/13 0755) Weight:  [135 kg] 135 kg (11/13 0655) Weight change: 2 kg Filed Weights   10/30/18 0500 10/31/18 0416 10/31/18 0655  Weight: 130 kg 135 kg 135 kg    Recent Labs  Lab 10/31/18 0319  NA 137  K 4.3  CL 97*  CO2 27  GLUCOSE 119*  BUN 70*  CREATININE 5.48*  CALCIUM 9.7  PHOS 5.4*    Recent Labs  Lab 10/30/18 0247 10/30/18 1320 10/31/18 0319  WBC 10.4 10.8* 11.0*  HGB 6.3* 7.1* 7.0*  HCT 22.4* 25.4* 24.7*  MCV 90.0 90.1 89.8  PLT 212 220 245    Scheduled Meds: . sodium chloride   Intravenous Once  . chlorhexidine gluconate (MEDLINE KIT)  15 mL Mouth Rinse BID  . Chlorhexidine Gluconate Cloth  6 each Topical Q0600  . feeding supplement (PRO-STAT SUGAR FREE 64)  30 mL Per Tube TID  . heparin injection (subcutaneous)  5,000 Units Subcutaneous Q8H  . insulin aspart  0-20 Units Subcutaneous Q4H  . insulin glargine  30 Units Subcutaneous Daily  . ipratropium-albuterol  3 mL Nebulization Q6H  . mouth rinse  15 mL Mouth Rinse 10 times per day  . multivitamin  15 mL Per Tube Daily  . pantoprazole sodium  40 mg Per Tube Daily  . rosuvastatin  10 mg Per Tube QPM   Continuous Infusions: . sodium chloride Stopped (10/30/18 1900)  . sodium chloride    . feeding supplement (VITAL HIGH PROTEIN) 1,000 mL (10/31/18 0029)   PRN Meds:.sodium chloride, sodium chloride, acetaminophen (TYLENOL) oral liquid 160 mg/5 mL, alteplase, bisacodyl, diphenhydrAMINE, fentaNYL (SUBLIMAZE) injection, heparin, heparin, lidocaine (PF),  lidocaine-prilocaine, ondansetron (ZOFRAN) IV, pentafluoroprop-tetrafluoroeth   Donetta Potts,  MD 10/31/2018, 8:33 AM

## 2018-10-31 NOTE — Progress Notes (Signed)
NAME:  Catherine Williams, MRN:  [id number], DOB:  1961/05/22, LOS: 61 ADMISSION DATE:  10/07/2018, CONSULTATION DATE:  10/07/2018 REFERRING MD:  Dr. Benjamine Mola , CHIEF COMPLAINT:  Cardiac Arrest    Brief History   57 year old obese woman with OSA and CKD stage V admitted 10/20 after PEA cardiac arrest. Underwent hypothermia protocol and started on CRRT  10/26  sustained  brief PEA arrest  related to mucous plugging, underwent bronchoscopy 10/30 brief resp/PEA arrest -mucus plugging again , bronchoscopy minimal blood + mucus suctioned out  Past Medical History  Diastolic HF (EF 76-19, J0DT), COPD, PVD, Chronic Kidney Disease (Placement of Fistula on 10/17)   Significant Hospital Events   10/20 > Presents to ED s/p Cardiac Arrest  10/22 rewarmed at 1pm 10/24 - not awake enough to extubate but did follow commands  10/25 - sister from [place]. [place] at bedside. Daugther at bedside. Both very concerned she is not fully arousable. Per RN/family - can arouse to deep voice and track and nod and follow simple commands very briefly. Then falls asleep. Only 100cc./h urine so far. Per family - s/p LUE fistula recently in anticipation of future HD needs. Baseline creat 3.8s. Creat up at 4.47m%. Office renal doc -> Dr CMarval Regal    10/26 - 2 min PEA arrest with x  1 epi last night. Related to mucus plugging seen in ET tube via bronchoscopy. Sp BAL in RML Started on CRRT overnight -> This aM on CRRT, 40% fio2, PRVC and fentanyl and levophed gtt -> RASS -1 and followed simple commands. Seems weak 10/27 -  2L off with CRRT.  Off levophed 10/17/2018 status post second PEA arrest ? Mucus plugging , CRRT stopped, bronch unimpressive 11/1 trach , PEA /brady arrest p-trach , no sig bleeding , cath neg 11/6 CRRT stopped 11/7 low dose metoprolol started 11/8 iHD  Consults: date of consult/date signed off & final recs:  Cardiology 10/20 reconsult on 10/17/2018 PCCM 10/20  Procedures (surgical and bedside):  ETT  10/20 >> 11/1 Left IJ 10/20 >> 11/1 11/1 Tstomy >> Right IJ hemodialysis catheter 10/12/2018>>11/8 permacth 11/8 (IR)  Significant Diagnostic Tests:   CT Head 10/20 >>neg ECHO 10/21 >> nml LVEF, gr 2 DD EEG 10/21 >> slowing. No epileptiform activity.  Venous doppler legs 10/21 > no evidence DVT CT angio 11/7 >> neg  Micro Data:  Blood 10/20 >>ng Sputum 10/20 >>ng Urine 10/28 >>  Serratia marcescens resp 10/25 >> ng  Antimicrobials:  Cefepime 10/28 >>10/31, 11/3 >>11/10 ceftx 10/31 >>11/2  SUBJECTIVE/OVERNIGHT/INTERVAL HX  No acute distress  Objective   Blood pressure (!) 137/53, pulse 86, temperature 98.6 F (37 C), temperature source Oral, resp. rate (!) 26, height _0  (1.575 m), weight 135 kg, SpO2 99 %.    Vent Mode: PRVC FiO2 (%):  [30 %] 30 % Set Rate:  [20 bmp] 20 bmp Vt Set:  [400 mL] 400 mL PEEP:  [5 cmH20] 5[place]levillePressure:  [18 cmH20-26 cmH20] 26 cmH20   Intake/Output Summary (Last 24 hours) at 10/31/2018 0939 Last data filed at 10/31/2018 0932 Gross per 24 hour  Intake 2230 ml  Output -  Net 2230 ml   Filed Weights   10/30/18 0500 10/31/18 0416 10/31/18 0655  Weight: 130 kg 135 kg 135 kg   General: Morbidly obese female who is currently on dialysis and pressure support ventilation HEENT: The ostomy in place Neuro: Awake alert moves right foot trouble shoulder CV: s1s2 rrr, no m/r/g  PULM: even/non-labored, lungs bilaterally diminished in bases XV:QMGQ, non-tender, bsx4 active  Extremities: warm/dry, 2+ edema  Skin: no rashes or lesions    CXR No chest x-ray 10/31/2018  Assessment & Plan:   Acute Hypercarbic Respiratory Failure suspected secondary to decompensated Heart Failure   H/O COPD, OSA on CPAP at HS  Recurrent respiratory arrest secondary to blood clots /mucous plugs -Per protocol   V.Fib/PEA Cardiac Arrest - elevated troponin as expected. No evidence of DVT.  Decompensated Heart Failure  H/O Diastolic HF (EF 67-61,  P5KD) Recurrent PEA/cardiac arrest x2 - cath Negative Hypotension post PEA arrest -Per primary -Cardiology follow-up in the future  Acute on Chronic Stage IV Kidney Disease  Baseline Crt high 3s. . Recent placement of UE Fistula . -Dialysis per nephrology  Fever ? HCAP , Serratia UTI -   Tamoxifen completed   Hypoglycemia -resolved H/O DM, now uncontrolled CBG (last 3)  Recent Labs    10/31/18 0021 10/31/18 0348 10/31/18 0803  GLUCAP 119* 135* 117*   -Lantus -Sliding scale insulin   Acute Encephalopathy  -Resolved she follows some commands  Vaginal bleeding  -Resolving   Disposition / Summary of Today's Plan 10/31/18   Intermittent dialysis Knowledge of recurrent cardiac arrest remains unclear Currently wean per protocol Await LTAC placement [facility] critical care will see as needed      Code Status: [facility] Family Communication: #13 2019 no family at bedside  [place] PCCM Pager [phone] till 1 pm If no answer page [phone] 10/31/2018, 9:39 AM

## 2018-10-31 NOTE — Progress Notes (Signed)
RT note: patient meets criteria for daily AM SBT however patient currently getting beside dialysis.  Will hold SBT until after dialysis.  Currently tolerating well.  Will continue to monitor.

## 2018-10-31 NOTE — Progress Notes (Signed)
[facility] PROGRESS NOTE  Catherine Williams FYB:[id number] DOB: 1961/07/06 DOA: 10/07/2018 PCP: Vonna Drafts, FNP  Brief summary   [phone] F w/ a hx of obesity, OSA, and CKD stage V who was admitted 10/20 after a PEA cardiac arrest. Underwent hypothermia protocol andstarted on CRRT. On  10/26 she sustained abrief PEA arrest related to mucous plugging,and underwent bronchoscopy. Again on 10/30 she suffered another brief resp/PEA arrest w/ mucus plugging. A second bronchoscopy noted minimal blood + mucus suctioned out.  Assessment/Plan:  Recurring PEA arrest ?due to mucous plugging - etiology not fully clear - cardiac cath unremarkable - appears stable at this time   Recurrent acute respiratory arrest . Thought due to HF, Renal failure, Pneumonia. Now trach/vent dependent - weaning per PCCM - appears she will benefit from [facility]. On tube feeding   COPD. Well compensated at this time  OSA. on CPAP at HS previously   Decompensated Diastolic Heart Failure. Improving. Cont HD per nephrology.   Acute on Chronic Stage IV Kidney Disease - anuric . recent placement of UE Fistula -now requiringiHD per Nephrology   Multifactorial anemia. acute blood loss w/ vaginal bleeding + anemia of CKD + anemia of critical illness - epo and Fe per Nephrology - s/p 2U PRBC. Then another 1U on 11/13. follow trend, TF as needed  ?HCAP + Serratia UTI. Cefepime through 11/10 (~total 14 days) - no active signs of infection presently   DM. CBG controlled   Vaginal bleeding / Dysfunctional uterine bleeding. Unclear if this is a chronic issue or not - had a normal CT abdom for this indication in 2012. Repeat CT abd/pelvis on 11/13 showed fibroid appearing uterus. Will refer for GYN as outpatient   Morbid obesity - Body mass index is 52.42 kg/m.   Code Status: full Family Communication:  D/w patient, RN (indicate person spoken with, relationship, and if by phone, the number) Disposition  Plan: needs LTEC   Consultants:  Critical care  Nephrology   Procedures:  10/20 ED s/p Cardiac Arrest - hypothermia protocol 10/22 rewarmed at 1pm 10/25 very sedate - minimally responsive 10/26 2 min PEA arrest with x 1 epi > mucus plugging in ET per cronch 10/26 CRRT overnight - levophed gtt  10/27 Off levophed 10/30 second PEA arrest > mucus plugging - bronch unimpressive 11/1 trach - PEA/brady arrest during iHD - emergent cardiac cath neg 11/6 CRRT stopped 11/8 permacath placed in IR > iHD 11/9 vaginal bleeding dark blood  11/10 TRH assume care   Antibiotics: Cefepime 10/28  >10/31 + 11/3 > 11/10  Ceftx 10/31 > 11/2 (indicate start date, and stop date if known)  HPI/Subjective: On vent. No acute distress. HD is in process. Afebrile.   Objective: Vitals:   10/31/18 0945 10/31/18 1000  BP: (!) 142/41 (!) 150/34  Pulse: 88 86  Resp: (!) 28   Temp:  98.5 F (36.9 C)  SpO2: 97% 100%    Intake/Output Summary (Last 24 hours) at 10/31/2018 1004 Last data filed at 10/31/2018 0932 Gross per 24 hour  Intake 2190 ml  Output -  Net 2190 ml   Filed Weights   10/30/18 0500 10/31/18 0416 10/31/18 0655  Weight: 130 kg 135 kg 135 kg    Exam:   General:  No acute distress   Cardiovascular: s1,s2 rrr  Respiratory: diminished in LL  Abdomen: soft,nt   Musculoskeletal: mild edema in ankles    Data Reviewed: Basic Metabolic Panel: Recent Labs  Lab 10/27/18 0402 10/28/18  1224 10/29/18 0327 10/30/18 0247 10/31/18 0319  NA 135 136 138 135 137  K 4.0 4.3 4.5 3.7 4.3  CL 99 100 99 96* 97*  CO2 24 21* 25 27 27   GLUCOSE 129* 122* 101* 129* 119*  BUN 40* 64* 81* 49* 70*  CREATININE 4.07* 5.46* 6.62* 4.26* 5.48*  CALCIUM 9.0 9.6 9.9 9.2 9.7  MG 2.4 2.3 2.4 2.2 2.2  PHOS 4.8* 5.9* 7.2* 4.0 5.4*   Liver Function Tests: Recent Labs  Lab 10/27/18 0402 10/28/18 8250 10/29/18 0327 10/30/18 0247 10/31/18 0319  ALBUMIN 1.9* 2.0* 2.0* 1.9* 1.9*   No results  for input(s): LIPASE, AMYLASE in the last 168 hours. No results for input(s): AMMONIA in the last 168 hours. CBC: Recent Labs  Lab 10/28/18 0632 10/29/18 0327 10/30/18 0247 10/30/18 1320 10/31/18 0319  WBC 10.8* 9.1 10.4 10.8* 11.0*  HGB 7.0* 7.1* 6.3* 7.1* 7.0*  HCT 25.0* 25.1* 22.4* 25.4* 24.7*  MCV 89.6 89.3 90.0 90.1 89.8  PLT 280 275 212 220 245   Cardiac Enzymes: No results for input(s): CKTOTAL, CKMB, CKMBINDEX, TROPONINI in the last 168 hours. BNP (last 3 results) Recent Labs    05/13/18 0333  BNP 78.9    ProBNP (last 3 results) No results for input(s): PROBNP in the last 8760 hours.  CBG: Recent Labs  Lab 10/30/18 1611 10/30/18 1954 10/31/18 0021 10/31/18 0348 10/31/18 0803  GLUCAP 115* 107* 119* 135* 117*    No results found for this or any previous visit (from the past 240 hour(s)).   Studies: Ct Pelvis Wo Contrast  Result Date: 10/31/2018 CLINICAL DATA:  Postmenopausal bleeding. EXAM: CT PELVIS WITHOUT CONTRAST TECHNIQUE: Multidetector CT imaging of the pelvis was performed following the standard protocol without intravenous contrast. COMPARISON:  None. FINDINGS: Urinary Tract: No hydroureteronephrosis. The urinary bladder is unremarkable for the degree of distention. Bowel: Enteric contrast reaches the rectum. No bowel obstruction is seen. Small and large bowel loops are seen within the patient's pannus pannus. Vascular/Lymphatic: Nonaneurysmal distal abdominal aorta with atherosclerosis of the common iliac arteries and branch vessels. Reproductive: Fibroid appearing uterus with intramural scattered coarse calcifications likely representing areas of fibroid degeneration. No adnexal mass. Other: Mild soft tissue anasarca. Layering gallstones of the included gallbladder without wall thickening or pericholecystic fluid. Musculoskeletal: No suspicious bone lesions identified. IMPRESSION: 1. No acute bowel obstruction or inflammation. 2. Fibroid appearing uterus.  3. Mild soft tissue anasarca. 4. Cholelithiasis without acute cholecystitis. Electronically Signed   By: Ashley Royalty M.D.   On: 10/31/2018 00:02    Scheduled Meds: . sodium chloride   Intravenous Once  . chlorhexidine gluconate (MEDLINE KIT)  15 mL Mouth Rinse BID  . Chlorhexidine Gluconate Cloth  6 each Topical Q0600  . feeding supplement (PRO-STAT SUGAR FREE 64)  30 mL Per Tube TID  . heparin injection (subcutaneous)  5,000 Units Subcutaneous Q8H  . insulin aspart  0-20 Units Subcutaneous Q4H  . insulin glargine  30 Units Subcutaneous Daily  . ipratropium-albuterol  3 mL Nebulization Q6H  . mouth rinse  15 mL Mouth Rinse 10 times per day  . multivitamin  15 mL Per Tube Daily  . pantoprazole sodium  40 mg Per Tube Daily  . rosuvastatin  10 mg Per Tube QPM   Continuous Infusions: . sodium chloride Stopped (10/30/18 1900)  . sodium chloride    . feeding supplement (VITAL HIGH PROTEIN) 1,000 mL (10/31/18 0029)    Active Problems:   Respiratory failure ([place])  Cardiac arrest ([place])   Acute kidney injury ([place])   CKD (chronic kidney disease) stage 5, GFR less than 15 ml/min (HCC)   Ventilator dependent (HCC)   Endotracheal tube present   Mucus plugging of bronchi   Pressure injury of skin   Tracheostomy status ([place])   Cardiogenic shock ([place])    Time spent: >35 minutes     Kinnie Feil  [facility] Pager [phone]. If 7PM-7AM, please contact night-coverage at [email or web address], password [facility] 10/31/2018, 10:04 AM  LOS: 24 days

## 2018-10-31 NOTE — Telephone Encounter (Signed)
Attempted to contact pt. I did not receive an answer. I have left a message for pt to return our call.  

## 2018-11-01 ENCOUNTER — Other Ambulatory Visit (HOSPITAL_COMMUNITY): Payer: Self-pay

## 2018-11-01 ENCOUNTER — Inpatient Hospital Stay
Admission: RE | Admit: 2018-11-01 | Discharge: 2018-11-08 | Disposition: A | Discharge status: Nursing Home (Includes ICF) | Payer: Medicare Other | Source: Other Acute Inpatient Hospital | Attending: Internal Medicine | Admitting: Internal Medicine

## 2018-11-01 DIAGNOSIS — K8020 Calculus of gallbladder without cholecystitis without obstruction: Secondary | ICD-10-CM | POA: Diagnosis not present

## 2018-11-01 DIAGNOSIS — D72829 Elevated white blood cell count, unspecified: Secondary | ICD-10-CM | POA: Diagnosis not present

## 2018-11-01 DIAGNOSIS — Z4659 Encounter for fitting and adjustment of other gastrointestinal appliance and device: Secondary | ICD-10-CM

## 2018-11-01 DIAGNOSIS — J189 Pneumonia, unspecified organism: Secondary | ICD-10-CM

## 2018-11-01 DIAGNOSIS — I5030 Unspecified diastolic (congestive) heart failure: Secondary | ICD-10-CM | POA: Diagnosis not present

## 2018-11-01 DIAGNOSIS — J440 Chronic obstructive pulmonary disease with acute lower respiratory infection: Secondary | ICD-10-CM | POA: Diagnosis present

## 2018-11-01 DIAGNOSIS — I5031 Acute diastolic (congestive) heart failure: Secondary | ICD-10-CM | POA: Diagnosis not present

## 2018-11-01 DIAGNOSIS — Z992 Dependence on renal dialysis: Secondary | ICD-10-CM

## 2018-11-01 DIAGNOSIS — Z794 Long term (current) use of insulin: Secondary | ICD-10-CM | POA: Diagnosis not present

## 2018-11-01 DIAGNOSIS — J90 Pleural effusion, not elsewhere classified: Secondary | ICD-10-CM | POA: Diagnosis not present

## 2018-11-01 DIAGNOSIS — R2689 Other abnormalities of gait and mobility: Secondary | ICD-10-CM | POA: Diagnosis not present

## 2018-11-01 DIAGNOSIS — N390 Urinary tract infection, site not specified: Secondary | ICD-10-CM | POA: Diagnosis present

## 2018-11-01 DIAGNOSIS — J9690 Respiratory failure, unspecified, unspecified whether with hypoxia or hypercapnia: Secondary | ICD-10-CM | POA: Diagnosis not present

## 2018-11-01 DIAGNOSIS — R1031 Right lower quadrant pain: Secondary | ICD-10-CM | POA: Diagnosis not present

## 2018-11-01 DIAGNOSIS — E119 Type 2 diabetes mellitus without complications: Secondary | ICD-10-CM | POA: Diagnosis not present

## 2018-11-01 DIAGNOSIS — K8000 Calculus of gallbladder with acute cholecystitis without obstruction: Secondary | ICD-10-CM | POA: Diagnosis not present

## 2018-11-01 DIAGNOSIS — I5032 Chronic diastolic (congestive) heart failure: Secondary | ICD-10-CM | POA: Diagnosis not present

## 2018-11-01 DIAGNOSIS — Z978 Presence of other specified devices: Secondary | ICD-10-CM | POA: Diagnosis not present

## 2018-11-01 DIAGNOSIS — Z8674 Personal history of sudden cardiac arrest: Secondary | ICD-10-CM | POA: Diagnosis not present

## 2018-11-01 DIAGNOSIS — I129 Hypertensive chronic kidney disease with stage 1 through stage 4 chronic kidney disease, or unspecified chronic kidney disease: Secondary | ICD-10-CM | POA: Diagnosis not present

## 2018-11-01 DIAGNOSIS — B9689 Other specified bacterial agents as the cause of diseases classified elsewhere: Secondary | ICD-10-CM | POA: Diagnosis present

## 2018-11-01 DIAGNOSIS — J9600 Acute respiratory failure, unspecified whether with hypoxia or hypercapnia: Secondary | ICD-10-CM | POA: Diagnosis not present

## 2018-11-01 DIAGNOSIS — D62 Acute posthemorrhagic anemia: Secondary | ICD-10-CM | POA: Diagnosis not present

## 2018-11-01 DIAGNOSIS — Z4682 Encounter for fitting and adjustment of non-vascular catheter: Secondary | ICD-10-CM | POA: Diagnosis not present

## 2018-11-01 DIAGNOSIS — J9621 Acute and chronic respiratory failure with hypoxia: Secondary | ICD-10-CM | POA: Diagnosis present

## 2018-11-01 DIAGNOSIS — I110 Hypertensive heart disease with heart failure: Secondary | ICD-10-CM | POA: Diagnosis not present

## 2018-11-01 DIAGNOSIS — E46 Unspecified protein-calorie malnutrition: Secondary | ICD-10-CM | POA: Diagnosis present

## 2018-11-01 DIAGNOSIS — I5022 Chronic systolic (congestive) heart failure: Secondary | ICD-10-CM | POA: Diagnosis not present

## 2018-11-01 DIAGNOSIS — E662 Morbid (severe) obesity with alveolar hypoventilation: Secondary | ICD-10-CM | POA: Diagnosis not present

## 2018-11-01 DIAGNOSIS — N2581 Secondary hyperparathyroidism of renal origin: Secondary | ICD-10-CM | POA: Diagnosis present

## 2018-11-01 DIAGNOSIS — J9620 Acute and chronic respiratory failure, unspecified whether with hypoxia or hypercapnia: Secondary | ICD-10-CM | POA: Diagnosis not present

## 2018-11-01 DIAGNOSIS — D631 Anemia in chronic kidney disease: Secondary | ICD-10-CM | POA: Diagnosis not present

## 2018-11-01 DIAGNOSIS — I469 Cardiac arrest, cause unspecified: Secondary | ICD-10-CM | POA: Diagnosis not present

## 2018-11-01 DIAGNOSIS — N186 End stage renal disease: Secondary | ICD-10-CM | POA: Diagnosis present

## 2018-11-01 DIAGNOSIS — N184 Chronic kidney disease, stage 4 (severe): Secondary | ICD-10-CM | POA: Diagnosis not present

## 2018-11-01 DIAGNOSIS — Z9049 Acquired absence of other specified parts of digestive tract: Secondary | ICD-10-CM | POA: Diagnosis not present

## 2018-11-01 DIAGNOSIS — N185 Chronic kidney disease, stage 5: Secondary | ICD-10-CM | POA: Diagnosis not present

## 2018-11-01 DIAGNOSIS — M6281 Muscle weakness (generalized): Secondary | ICD-10-CM | POA: Diagnosis not present

## 2018-11-01 DIAGNOSIS — J449 Chronic obstructive pulmonary disease, unspecified: Secondary | ICD-10-CM | POA: Diagnosis not present

## 2018-11-01 DIAGNOSIS — N179 Acute kidney failure, unspecified: Secondary | ICD-10-CM | POA: Diagnosis present

## 2018-11-01 DIAGNOSIS — Z452 Encounter for adjustment and management of vascular access device: Secondary | ICD-10-CM | POA: Diagnosis not present

## 2018-11-01 DIAGNOSIS — J398 Other specified diseases of upper respiratory tract: Secondary | ICD-10-CM | POA: Diagnosis present

## 2018-11-01 DIAGNOSIS — E875 Hyperkalemia: Secondary | ICD-10-CM | POA: Diagnosis not present

## 2018-11-01 DIAGNOSIS — I5033 Acute on chronic diastolic (congestive) heart failure: Secondary | ICD-10-CM | POA: Diagnosis present

## 2018-11-01 DIAGNOSIS — N189 Chronic kidney disease, unspecified: Secondary | ICD-10-CM | POA: Diagnosis not present

## 2018-11-01 DIAGNOSIS — G4733 Obstructive sleep apnea (adult) (pediatric): Secondary | ICD-10-CM | POA: Diagnosis present

## 2018-11-01 DIAGNOSIS — J9809 Other diseases of bronchus, not elsewhere classified: Secondary | ICD-10-CM | POA: Diagnosis not present

## 2018-11-01 DIAGNOSIS — Y95 Nosocomial condition: Secondary | ICD-10-CM | POA: Diagnosis not present

## 2018-11-01 DIAGNOSIS — K810 Acute cholecystitis: Secondary | ICD-10-CM | POA: Diagnosis present

## 2018-11-01 DIAGNOSIS — R6510 Systemic inflammatory response syndrome (SIRS) of non-infectious origin without acute organ dysfunction: Secondary | ICD-10-CM | POA: Diagnosis not present

## 2018-11-01 DIAGNOSIS — R1310 Dysphagia, unspecified: Secondary | ICD-10-CM | POA: Diagnosis not present

## 2018-11-01 DIAGNOSIS — Z6841 Body Mass Index (BMI) 40.0 and over, adult: Secondary | ICD-10-CM | POA: Diagnosis not present

## 2018-11-01 DIAGNOSIS — R140 Abdominal distension (gaseous): Secondary | ICD-10-CM | POA: Diagnosis not present

## 2018-11-01 DIAGNOSIS — L97419 Non-pressure chronic ulcer of right heel and midfoot with unspecified severity: Secondary | ICD-10-CM | POA: Diagnosis present

## 2018-11-01 DIAGNOSIS — K819 Cholecystitis, unspecified: Secondary | ICD-10-CM | POA: Diagnosis not present

## 2018-11-01 DIAGNOSIS — Z430 Encounter for attention to tracheostomy: Secondary | ICD-10-CM | POA: Diagnosis not present

## 2018-11-01 DIAGNOSIS — R092 Respiratory arrest: Secondary | ICD-10-CM | POA: Diagnosis not present

## 2018-11-01 DIAGNOSIS — T85598A Other mechanical complication of other gastrointestinal prosthetic devices, implants and grafts, initial encounter: Secondary | ICD-10-CM

## 2018-11-01 DIAGNOSIS — E1122 Type 2 diabetes mellitus with diabetic chronic kidney disease: Secondary | ICD-10-CM | POA: Diagnosis present

## 2018-11-01 DIAGNOSIS — N938 Other specified abnormal uterine and vaginal bleeding: Secondary | ICD-10-CM | POA: Diagnosis present

## 2018-11-01 DIAGNOSIS — Z23 Encounter for immunization: Secondary | ICD-10-CM | POA: Diagnosis not present

## 2018-11-01 DIAGNOSIS — I132 Hypertensive heart and chronic kidney disease with heart failure and with stage 5 chronic kidney disease, or end stage renal disease: Secondary | ICD-10-CM | POA: Diagnosis present

## 2018-11-01 DIAGNOSIS — R05 Cough: Secondary | ICD-10-CM | POA: Diagnosis not present

## 2018-11-01 DIAGNOSIS — R570 Cardiogenic shock: Secondary | ICD-10-CM | POA: Diagnosis not present

## 2018-11-01 DIAGNOSIS — E8770 Fluid overload, unspecified: Secondary | ICD-10-CM | POA: Diagnosis not present

## 2018-11-01 DIAGNOSIS — R0602 Shortness of breath: Secondary | ICD-10-CM | POA: Diagnosis not present

## 2018-11-01 DIAGNOSIS — Z9911 Dependence on respirator [ventilator] status: Secondary | ICD-10-CM | POA: Diagnosis not present

## 2018-11-01 DIAGNOSIS — R0989 Other specified symptoms and signs involving the circulatory and respiratory systems: Secondary | ICD-10-CM | POA: Diagnosis not present

## 2018-11-01 DIAGNOSIS — E669 Obesity, unspecified: Secondary | ICD-10-CM | POA: Diagnosis present

## 2018-11-01 HISTORY — DX: Other specified diseases of upper respiratory tract: J39.8

## 2018-11-01 HISTORY — DX: End stage renal disease: N18.6

## 2018-11-01 HISTORY — DX: Chronic diastolic (congestive) heart failure: I50.32

## 2018-11-01 HISTORY — DX: Pneumonia, unspecified organism: J18.9

## 2018-11-01 HISTORY — DX: Acute and chronic respiratory failure with hypoxia: J96.21

## 2018-11-01 HISTORY — DX: Cardiac arrest, cause unspecified: I46.9

## 2018-11-01 HISTORY — DX: End stage renal disease: Z99.2

## 2018-11-01 LAB — BASIC METABOLIC PANEL
Anion gap: 14 (ref 5–15)
BUN: 54 mg/dL — ABNORMAL HIGH (ref 6–20)
CO2: 26 mmol/L (ref 22–32)
Calcium: 9.9 mg/dL (ref 8.9–10.3)
Chloride: 93 mmol/L — ABNORMAL LOW (ref 98–111)
Creatinine, Ser: 4.4 mg/dL — ABNORMAL HIGH (ref 0.44–1.00)
GFR calc Af Amer: 12 mL/min — ABNORMAL LOW (ref >60)
GFR calc non Af Amer: 10 mL/min — ABNORMAL LOW (ref >60)
Glucose, Bld: 130 mg/dL — ABNORMAL HIGH (ref 70–99)
Potassium: 3.8 mmol/L (ref 3.5–5.1)
Sodium: 133 mmol/L — ABNORMAL LOW (ref 135–145)

## 2018-11-01 LAB — BPAM RBC
BLOOD PRODUCT EXPIRATION DATE: 201912112359
Blood Product Expiration Date: 201912032359
Blood Product Expiration Date: 201912112359
ISSUE DATE / TIME: 201911120650
ISSUE DATE / TIME: 201911130745
ISSUE DATE / TIME: 201911130900
UNIT TYPE AND RH: 5100
Unit Type and Rh: 5100
Unit Type and Rh: 5100

## 2018-11-01 LAB — TYPE AND SCREEN
ABO/RH(D): O POS
ANTIBODY SCREEN: NEGATIVE
UNIT DIVISION: 0
Unit division: 0
Unit division: 0

## 2018-11-01 LAB — GLUCOSE, CAPILLARY
GLUCOSE-CAPILLARY: 144 mg/dL — AB (ref 70–99)
Glucose-Capillary: 112 mg/dL — ABNORMAL HIGH (ref 70–99)
Glucose-Capillary: 125 mg/dL — ABNORMAL HIGH (ref 70–99)
Glucose-Capillary: 130 mg/dL — ABNORMAL HIGH (ref 70–99)

## 2018-11-01 LAB — CBC
HCT: 29.3 % — ABNORMAL LOW (ref 36.0–46.0)
Hemoglobin: 8.5 g/dL — ABNORMAL LOW (ref 12.0–15.0)
MCH: 25.8 pg — ABNORMAL LOW (ref 26.0–34.0)
MCHC: 29 g/dL — ABNORMAL LOW (ref 30.0–36.0)
MCV: 88.8 fL (ref 80.0–100.0)
Platelets: 200 10*3/uL (ref 150–400)
RBC: 3.3 MIL/uL — ABNORMAL LOW (ref 3.87–5.11)
RDW: 16.8 % — ABNORMAL HIGH (ref 11.5–15.5)
WBC: 11.1 10*3/uL — ABNORMAL HIGH (ref 4.0–10.5)
nRBC: 0.4 % — ABNORMAL HIGH (ref 0.0–0.2)

## 2018-11-01 MED ORDER — PRO-STAT SUGAR FREE PO LIQD: 30.0000 mL | Freq: Three times a day (TID) | ORAL | 0 refills | Status: DC

## 2018-11-01 MED ORDER — VITAL HIGH PROTEIN PO LIQD: 1000.0000 mL | ORAL | Status: AC

## 2018-11-01 MED ORDER — MIDODRINE HCL 2.5 MG PO TABS: 2.5000 mg | ORAL_TABLET | Freq: Three times a day (TID) | ORAL | Status: AC

## 2018-11-01 MED ORDER — CHLORHEXIDINE GLUCONATE 0.12% ORAL RINSE (MEDLINE KIT): 15.0000 mL | Freq: Two times a day (BID) | OROMUCOSAL | 0 refills | Status: DC

## 2018-11-01 MED ORDER — INSULIN GLARGINE 100 UNIT/ML ~~LOC~~ SOLN: 30.0000 [IU] | Freq: Every day | SUBCUTANEOUS | 11 refills | Status: AC

## 2018-11-01 MED ORDER — ASPIRIN 81 MG PO TABS: 81.0000 mg | ORAL_TABLET | Freq: Every day | ORAL | Status: DC

## 2018-11-01 MED ORDER — INSULIN ASPART 100 UNIT/ML ~~LOC~~ SOLN: 0.0000 [IU] | SUBCUTANEOUS | 11 refills | Status: DC

## 2018-11-01 MED ORDER — PANTOPRAZOLE SODIUM 40 MG PO PACK: 40.0000 mg | PACK | Freq: Every day | ORAL | Status: DC

## 2018-11-01 MED ORDER — ACETAMINOPHEN 160 MG/5ML PO SOLN: 650.0000 mg | Freq: Four times a day (QID) | ORAL | 0 refills | Status: AC | PRN

## 2018-11-01 MED ORDER — IPRATROPIUM-ALBUTEROL 0.5-2.5 (3) MG/3ML IN SOLN
3.0000 mL | Freq: Three times a day (TID) | RESPIRATORY_TRACT | Status: DC
  Administered 2018-11-01 (×2): 3 mL via RESPIRATORY_TRACT
  Filled 2018-11-01 (×2): qty 3

## 2018-11-01 MED ORDER — MIDODRINE HCL 5 MG PO TABS
2.5000 mg | ORAL_TABLET | Freq: Three times a day (TID) | ORAL | Status: DC
  Administered 2018-11-01: 2.5 mg via ORAL
  Filled 2018-11-01: qty 1

## 2018-11-01 MED ORDER — IPRATROPIUM-ALBUTEROL 0.5-2.5 (3) MG/3ML IN SOLN: 3.0000 mL | Freq: Three times a day (TID) | RESPIRATORY_TRACT | Status: AC

## 2018-11-01 MED ORDER — ALLOPURINOL 100 MG PO TABS: 100.0000 mg | ORAL_TABLET | Freq: Every day | ORAL | 6 refills | Status: DC

## 2018-11-01 MED ORDER — ADULT MULTIVITAMIN LIQUID CH: 15.0000 mL | Freq: Every day | ORAL | Status: DC

## 2018-11-01 MED ORDER — ROSUVASTATIN CALCIUM 10 MG PO TABS: 10.0000 mg | ORAL_TABLET | Freq: Every evening | ORAL | Status: DC

## 2018-11-01 NOTE — Progress Notes (Signed)
  Speech Language Pathology Treatment: Catherine Williams Speaking valve  Patient Details Name: Catherine Williams MRN: [id number] DOB: June 05, 1961 Today's Date: 11/01/2018 Time: [id number] SLP Time Calculation (min) (ACUTE ONLY): 15 min  Assessment / Plan / Recommendation Clinical Impression  Pt seen with RT and initiated PMV treatment with pt on PS/CPAP. RT suctioned, PEEP turned to 0 and PMV and pt verbalizing in a whisper rather than usage of true vocal cords. SLP sensed air from nasal cavity indicating some degree of patent upper airway. Pt appeared restless with facial grimace and expressed difficulty breathing. RT switched mode to [facility] which did not assist respiratory effort and pt mouthed she felt as though she was "suffocating" although vitals appeared stable. Pt's tidal volume at 650 and RT did not think increasing would assist. Valve removed and RT readjusted settings. Although she did not appear anxious perhaps degree of anxiety present. Pt also appeared slightly less lucid than previous visit. Plans for transfer to [facility] today and recommend continued ST.    HPI HPI: Pt is a 57 y.o. female with PMH significant for OSA, morbid obesity, GERD, COPD, asthma, DM, HTN, CHF, CKD, and recent placement of LAVF on 10/04/18. She presented to [facility] ED 10/07/18 following sudden cardiac arrest witnessed by family at home. Per chart, pt has had multiple, recurrent episodes of PEA arrest during this admission. ETT 10/07/18 with trach placement 10/19/18. Chest CT 10/25/18 showed dependent atelectasis in the lower lobes, patchy areas of ground-glass opacity most evident in the upper lobes, no evidence of pulmonary edema. Head CT no acute findings.       SLP Plan  Continue with current plan of care       Recommendations         Patient may use Passy-Muir Speech Valve: with SLP only PMSV Supervision: Full         Oral Care Recommendations: Oral care QID Follow up Recommendations: LTACH SLP Visit Diagnosis:  Aphonia (R49.1) Plan: Continue with current plan of care       GO                Catherine Williams 11/01/2018, 3:27 PM  Catherine Williams Risk analyst [phone] Office [phone]

## 2018-11-01 NOTE — Progress Notes (Signed)
Patient transported to [facility], 5E16. No complications.

## 2018-11-01 NOTE — Progress Notes (Signed)
S:tolerated hd well yesterday with blood transfusion.  No complaints O:BP (!) 97/36   Pulse 80   Temp 98.7 F (37.1 C) (Oral)   Resp (!) 29   Ht 5' 2"  (1.575 m)   Wt 135 kg   SpO2 97%   BMI 54.44 kg/m   Intake/Output Summary (Last 24 hours) at 11/01/2018 0906 Last data filed at 11/01/2018 0800 Gross per 24 hour  Intake 1676 ml  Output 2000 ml  Net -324 ml   Intake/Output: I/O last 3 completed shifts: In: 2500 [Blood:630; NG/GT:1870] Out: 2000 [Other:2000]  Intake/Output this shift:  Total I/O In: 70 [NG/GT:70] Out: -  Weight change: 0 kg Gen: NAD, obese, on vent via trach CVS: no rub Resp: cta Abd: obese, +BS, soft, NT Ext: no edema, LUE AVF +T/B  Recent Labs  Lab 10/26/18 0506 10/27/18 0402 10/28/18 0632 10/29/18 0327 10/30/18 0247 10/31/18 0319 11/01/18 0600  NA 136 135 136 138 135 137 133*  K 4.7 4.0 4.3 4.5 3.7 4.3 3.8  CL 100 99 100 99 96* 97* 93*  CO2 21* 24 21* 25 27 27 26   GLUCOSE 105* 129* 122* 101* 129* 119* 130*  BUN 66* 40* 64* 81* 49* 70* 54*  CREATININE 5.48* 4.07* 5.46* 6.62* 4.26* 5.48* 4.40*  ALBUMIN 1.9* 1.9* 2.0* 2.0* 1.9* 1.9*  --   CALCIUM 9.9 9.0 9.6 9.9 9.2 9.7 9.9  PHOS 6.4* 4.8* 5.9* 7.2* 4.0 5.4*  --    Liver Function Tests: Recent Labs  Lab 10/29/18 0327 10/30/18 0247 10/31/18 0319  ALBUMIN 2.0* 1.9* 1.9*   No results for input(s): LIPASE, AMYLASE in the last 168 hours. No results for input(s): AMMONIA in the last 168 hours. CBC: Recent Labs  Lab 10/29/18 0327 10/30/18 0247 10/30/18 1320 10/31/18 0319 11/01/18 0600  WBC 9.1 10.4 10.8* 11.0* 11.1*  HGB 7.1* 6.3* 7.1* 7.0* 8.5*  HCT 25.1* 22.4* 25.4* 24.7* 29.3*  MCV 89.3 90.0 90.1 89.8 88.8  PLT 275 212 220 245 200   Cardiac Enzymes: No results for input(s): CKTOTAL, CKMB, CKMBINDEX, TROPONINI in the last 168 hours. CBG: Recent Labs  Lab 10/31/18 1607 10/31/18 1953 11/01/18 0030 11/01/18 0425 11/01/18 0803  GLUCAP 122* 109* 125* 144* 112*    Iron  Studies: No results for input(s): IRON, TIBC, TRANSFERRIN, FERRITIN in the last 72 hours. Studies/Results: Ct Pelvis Wo Contrast  Result Date: 10/31/2018 CLINICAL DATA:  Postmenopausal bleeding. EXAM: CT PELVIS WITHOUT CONTRAST TECHNIQUE: Multidetector CT imaging of the pelvis was performed following the standard protocol without intravenous contrast. COMPARISON:  None. FINDINGS: Urinary Tract: No hydroureteronephrosis. The urinary bladder is unremarkable for the degree of distention. Bowel: Enteric contrast reaches the rectum. No bowel obstruction is seen. Small and large bowel loops are seen within the patient's pannus pannus. Vascular/Lymphatic: Nonaneurysmal distal abdominal aorta with atherosclerosis of the common iliac arteries and branch vessels. Reproductive: Fibroid appearing uterus with intramural scattered coarse calcifications likely representing areas of fibroid degeneration. No adnexal mass. Other: Mild soft tissue anasarca. Layering gallstones of the included gallbladder without wall thickening or pericholecystic fluid. Musculoskeletal: No suspicious bone lesions identified. IMPRESSION: 1. No acute bowel obstruction or inflammation. 2. Fibroid appearing uterus. 3. Mild soft tissue anasarca. 4. Cholelithiasis without acute cholecystitis. Electronically Signed   By: Ashley Royalty M.D.   On: 10/31/2018 00:02   . sodium chloride   Intravenous Once  . chlorhexidine gluconate (MEDLINE KIT)  15 mL Mouth Rinse BID  . Chlorhexidine Gluconate Cloth  6 each Topical  H4035  . feeding supplement (PRO-STAT SUGAR FREE 64)  30 mL Per Tube TID  . heparin injection (subcutaneous)  5,000 Units Subcutaneous Q8H  . insulin aspart  0-20 Units Subcutaneous Q4H  . insulin glargine  30 Units Subcutaneous Daily  . ipratropium-albuterol  3 mL Nebulization TID  . mouth rinse  15 mL Mouth Rinse 10 times per day  . midodrine  2.5 mg Oral TID WC  . multivitamin  15 mL Per Tube Daily  . pantoprazole sodium  40 mg Per  Tube Daily  . rosuvastatin  10 mg Per Tube QPM    BMET    Component Value Date/Time   NA 133 (L) 11/01/2018 0600   K 3.8 11/01/2018 0600   CL 93 (L) 11/01/2018 0600   CO2 26 11/01/2018 0600   GLUCOSE 130 (H) 11/01/2018 0600   BUN 54 (H) 11/01/2018 0600   CREATININE 4.40 (H) 11/01/2018 0600   CALCIUM 9.9 11/01/2018 0600   CALCIUM 9.7 08/01/2018 1113   GFRNONAA 10 (L) 11/01/2018 0600   GFRAA 12 (L) 11/01/2018 0600   CBC    Component Value Date/Time   WBC 11.1 (H) 11/01/2018 0600   RBC 3.30 (L) 11/01/2018 0600   HGB 8.5 (L) 11/01/2018 0600   HCT 29.3 (L) 11/01/2018 0600   PLT 200 11/01/2018 0600   MCV 88.8 11/01/2018 0600   MCH 25.8 (L) 11/01/2018 0600   MCHC 29.0 (L) 11/01/2018 0600   RDW 16.8 (H) 11/01/2018 0600   LYMPHSABS 2.4 10/24/2018 0244   MONOABS 1.4 (H) 10/24/2018 0244   EOSABS 0.3 10/24/2018 0244   BASOSABS 0.1 10/24/2018 0244    Assessment/Plan:  1. AKI/CKD stage 4 in setting of cardiac arrest and has now been dialysis dependent since 10/12/18 (initially on CRRT until 10/30 and transitioned to IHD but complicated by PEA arrest x2). Tolerated last IHD (with blood transfusion) and plan for HD tomorrow and cont with MWF schedule.  2. V fib/PEA arrest- cardiac cath without findings for intervention. Has had multiple arrests to date, no clear etiology 3. Vascular access- LIJ TDC placed 10/26/18 and LUE AVF  4. Hypotension- may need midodrine for HD prn 5. Anemia of ckd and ABLA due to vaginal bleed. On Aranesp, but dropped to 6.3, s/p blood transfusion and Hgb 8.5 today. 6. VDRF- s/p trach per PCCM 7. Vaginal bleeding- consider gyn consult. 8. Disposition- will need LTC placement  Donetta Potts, MD [facility] [phone]

## 2018-11-01 NOTE — Progress Notes (Signed)
[facility] PROGRESS NOTE  Catherine Williams BDZ:[id number] DOB: 1961-01-28 DOA: 10/07/2018 PCP: Vonna Drafts, FNP  Brief summary   [phone] F w/ a hx of obesity, OSA, and CKD stage V who was admitted 10/20 after a PEA cardiac arrest. Underwent hypothermia protocol andstarted on CRRT. On  10/26 she sustained abrief PEA arrest related to mucous plugging,and underwent bronchoscopy. Again on 10/30 she suffered another brief resp/PEA arrest w/ mucus plugging. A second bronchoscopy noted minimal blood + mucus suctioned out.  Assessment/Plan:  Recurring PEA arrest ?due to mucous plugging - etiology not fully clear - cardiac cath unremarkable - appears stable at this time   Recurrent acute respiratory arrest . Thought due to HF, Renal failure, Pneumonia. Now trach/vent dependent - weaning per PCCM - appears she will benefit from [facility]. On tube feeding   COPD. compensated at this time. Cont bronchodilators prn   OSA. on CPAP at HS previously   Decompensated Diastolic Heart Failure. Improving. Cont HD per nephrology.   Acute on Chronic Stage IV Kidney Disease - anuric . recent placement of UE Fistula -now requiringiHD per Nephrology   Multifactorial anemia. acute blood loss w/ vaginal bleeding + anemia of CKD + anemia of critical illness - epo and Fe per Nephrology - s/p 2U PRBC. Then another 1U on 11/13. follow trend, TF as needed  ?HCAP + Serratia UTI. Cefepime through 11/10 (~total 14 days) - no active signs of infection presently   DM. Cont lantus+ISS. CBG controlled   Vaginal bleeding / Dysfunctional uterine bleeding. Unclear if this is a chronic issue or not - had a normal CT abdom for this indication in 2012. Repeat CT abd/pelvis on 11/13 showed fibroid appearing uterus. Will refer for GYN as outpatient   Morbid obesity - Body mass index is 52.42 kg/m.   Code Status: full Family Communication:  D/w patient, RN (indicate person spoken with, relationship, and  if by phone, the number) Disposition Plan: needs LTEC   Consultants:  Critical care  Nephrology   Procedures:  10/20 ED s/p Cardiac Arrest - hypothermia protocol 10/22 rewarmed at 1pm 10/25 very sedate - minimally responsive 10/26 2 min PEA arrest with x 1 epi > mucus plugging in ET per cronch 10/26 CRRT overnight - levophed gtt  10/27 Off levophed 10/30 second PEA arrest > mucus plugging - bronch unimpressive 11/1 trach - PEA/brady arrest during iHD - emergent cardiac cath neg 11/6 CRRT stopped 11/8 permacath placed in IR > iHD 11/9 vaginal bleeding dark blood  11/10 TRH assume care   Antibiotics: Cefepime 10/28  >10/31 + 11/3 > 11/10  Ceftx 10/31 > 11/2 (indicate start date, and stop date if known)  HPI/Subjective: BP is soft. No acute distress.  Afebrile.   Objective: Vitals:   11/01/18 0805 11/01/18 0830  BP:  (!) 104/51  Pulse:  78  Resp:    Temp: 98.7 F (37.1 C)   SpO2:  100%    Intake/Output Summary (Last 24 hours) at 11/01/2018 0859 Last data filed at 11/01/2018 0800 Gross per 24 hour  Intake 1746 ml  Output 2000 ml  Net -254 ml   Filed Weights   10/31/18 0416 10/31/18 0655 10/31/18 1140  Weight: 135 kg 135 kg 135 kg    Exam:   General:  No acute distress   Cardiovascular: s1,s2 rrr  Respiratory: diminished in LL  Abdomen: soft,nt   Musculoskeletal: mild edema in ankles    Data Reviewed: Basic Metabolic Panel: Recent Labs  Lab 10/27/18  7616 10/28/18 0737 10/29/18 0327 10/30/18 0247 10/31/18 0319 11/01/18 0600  NA 135 136 138 135 137 133*  K 4.0 4.3 4.5 3.7 4.3 3.8  CL 99 100 99 96* 97* 93*  CO2 24 21* _0 GLUCOSE 129* 122* 101* 129* 119* 130*  BUN 40* 64* 81* 49* 70* 54*  CREATININE 4.07* 5.46* 6.62* 4.26* 5.48* 4.40*  CALCIUM 9.0 9.6 9.9 9.2 9.7 9.9  MG 2.4 2.3 2.4 2.2 2.2  --   PHOS 4.8* 5.9* 7.2* 4.0 5.4*  --    Liver Function Tests: Recent Labs  Lab 10/27/18 0402 10/28/18 0632 10/29/18 0327  10/30/18 0247 10/31/18 0319  ALBUMIN 1.9* 2.0* 2.0* 1.9* 1.9*   No results for input(s): LIPASE, AMYLASE in the last 168 hours. No results for input(s): AMMONIA in the last 168 hours. CBC: Recent Labs  Lab 10/29/18 0327 10/30/18 0247 10/30/18 1320 10/31/18 0319 11/01/18 0600  WBC 9.1 10.4 10.8* 11.0* 11.1*  HGB 7.1* 6.3* 7.1* 7.0* 8.5*  HCT 25.1* 22.4* 25.4* 24.7* 29.3*  MCV 89.3 90.0 90.1 89.8 88.8  PLT 275 212 220 245 200   Cardiac Enzymes: No results for input(s): CKTOTAL, CKMB, CKMBINDEX, TROPONINI in the last 168 hours. BNP (last 3 results) Recent Labs    05/13/18 0333  BNP 78.9    ProBNP (last 3 results) No results for input(s): PROBNP in the last 8760 hours.  CBG: Recent Labs  Lab 10/31/18 1607 10/31/18 1953 11/01/18 0030 11/01/18 0425 11/01/18 0803  GLUCAP 122* 109* 125* 144* 112*    No results found for this or any previous visit (from the past 240 hour(s)).   Studies: Ct Pelvis Wo Contrast  Result Date: 10/31/2018 CLINICAL DATA:  Postmenopausal bleeding. EXAM: CT PELVIS WITHOUT CONTRAST TECHNIQUE: Multidetector CT imaging of the pelvis was performed following the standard protocol without intravenous contrast. COMPARISON:  None. FINDINGS: Urinary Tract: No hydroureteronephrosis. The urinary bladder is unremarkable for the degree of distention. Bowel: Enteric contrast reaches the rectum. No bowel obstruction is seen. Small and large bowel loops are seen within the patient's pannus pannus. Vascular/Lymphatic: Nonaneurysmal distal abdominal aorta with atherosclerosis of the common iliac arteries and branch vessels. Reproductive: Fibroid appearing uterus with intramural scattered coarse calcifications likely representing areas of fibroid degeneration. No adnexal mass. Other: Mild soft tissue anasarca. Layering gallstones of the included gallbladder without wall thickening or pericholecystic fluid. Musculoskeletal: No suspicious bone lesions identified.  IMPRESSION: 1. No acute bowel obstruction or inflammation. 2. Fibroid appearing uterus. 3. Mild soft tissue anasarca. 4. Cholelithiasis without acute cholecystitis. Electronically Signed   By: Ashley Royalty M.D.   On: 10/31/2018 00:02    Scheduled Meds: . sodium chloride   Intravenous Once  . chlorhexidine gluconate (MEDLINE KIT)  15 mL Mouth Rinse BID  . Chlorhexidine Gluconate Cloth  6 each Topical Q0600  . feeding supplement (PRO-STAT SUGAR FREE 64)  30 mL Per Tube TID  . heparin injection (subcutaneous)  5,000 Units Subcutaneous Q8H  . insulin aspart  0-20 Units Subcutaneous Q4H  . insulin glargine  30 Units Subcutaneous Daily  . ipratropium-albuterol  3 mL Nebulization TID  . mouth rinse  15 mL Mouth Rinse 10 times per day  . midodrine  2.5 mg Oral TID WC  . multivitamin  15 mL Per Tube Daily  . pantoprazole sodium  40 mg Per Tube Daily  . rosuvastatin  10 mg Per Tube QPM   Continuous Infusions: . sodium chloride Stopped (10/30/18 1900)  .  sodium chloride    . feeding supplement (VITAL HIGH PROTEIN) 1,000 mL (11/01/18 0000)    Active Problems:   Respiratory failure (HCC)   Cardiac arrest (HCC)   Acute kidney injury ([place])   CKD (chronic kidney disease) stage 5, GFR less than 15 ml/min (HCC)   Ventilator dependent (HCC)   Endotracheal tube present   Mucus plugging of bronchi   Pressure injury of skin   Tracheostomy status ([place])   Cardiogenic shock ([place])   Tracheostomy care ([place])    Time spent: >35 minutes     Kinnie Feil  [facility] Pager [phone]. If 7PM-7AM, please contact night-coverage at [email or web address], password [facility] 11/01/2018, 8:59 AM  LOS: 25 days

## 2018-11-01 NOTE — Discharge Summary (Signed)
Physician Discharge Summary  Catherine Williams:[id number] DOB: [birth year] DOA: 10/07/2018  PCP: Vonna Drafts, FNP  Admit date: 10/07/2018 Discharge date: 11/01/2018  Time spent: >35 minutes  Recommendations for Outpatient Follow-up:  [facility]  Pulmonology Nephrology  GYN Discharge Diagnoses:  Active Problems:   Respiratory failure (HCC)   Cardiac arrest (HCC)   Acute kidney injury ([place])   CKD (chronic kidney disease) stage 5, GFR less than 15 ml/min (HCC)   Ventilator dependent (HCC)   Endotracheal tube present   Mucus plugging of bronchi   Pressure injury of skin   Tracheostomy status ([place])   Cardiogenic shock ([place])   Tracheostomy care [facility])   Discharge Condition: stable   Diet recommendation: tube feeding. VITAL high protein liquid at 40 ml/hr continues. prostat sugar free 64 suppliment 30 ml TID  Filed Weights   10/31/18 0416 10/31/18 0655 10/31/18 1140  Weight: 135 kg 135 kg 135 kg    History of present illness:   57yo F w/ a hx of obesity, OSA, and CKD stage V who was admitted 10/20 after aPEA cardiac arrest. Underwent hypothermia protocol andstarted on CRRT. On  10/26 she sustained abrief PEA arrest related to mucous plugging,and underwent bronchoscopy. Again on 10/30 she suffered another brief resp/PEA arrest w/ mucus plugging. A second bronchoscopy noted minimal blood + mucus suctioned out.   Hospital Course:   Recurring PEA arrest thought due to mucous plugging - etiology not fully clear - cardiac cath unremarkable- appears stable at this time  Recurrent acute respiratory arrest . Thought due to HF, Renal failure, Pneumonia. Now trach/vent dependent - weaning per PCCM - appears she will benefit from [facility]. On tube feeding   COPD. compensated at this time. Cont bronchodilators prn   OSA. on CPAP at HS previously   Decompensated Diastolic Heart Failure. Improving. Cont HD per nephrology.   Acute on Chronic Stage IV Kidney Disease - anuric  . recent placement of UE Fistula -now requiringiHD per Nephrology   Multifactorial anemia. acute blood loss w/ vaginal bleeding + anemia of CKD + anemia of critical illness - epo and Fe per Nephrology - s/p2U PRBC. Then another 1U on 11/13. Hg is stable   ?HCAP + S[place] UTI. Cefepime through 11/10 (~total 14 days)- no active signs of infection presently  DM. Cont lantus+ISS. CBG controlled   Vaginal bleeding/ Dysfunctional uterine bleeding. Unclear if this is a chronic issue or not - had a normal CT abdom for this indication in 2012. Repeat CT abd/pelvis on 11/13 showed fibroid appearing uterus. May need GYN as outpatient if persistent   Morbid obesity -Body mass index is 52.42 kg/m.  Procedures:  10/20 ED s/p Cardiac Arrest - hypothermia protocol 10/22 rewarmed at 1pm 10/25 very sedate - minimally responsive 10/26 2 min PEA arrest with x 1 epi > mucus plugging in ET per cronch 10/26 CRRT overnight - levophed gtt  10/27 Off levophed 10/30 second PEA arrest > mucus plugging - bronch unimpressive 11/1 trach - PEA/brady arrest during iHD - emergent cardiac cath neg 11/6 CRRT stopped 11/8 permacath placed in IR > iHD 11/9 vaginal bleeding dark blood  Consultations:   Critical care  Nephrology    Discharge Exam: Vitals:   11/01/18 0830 11/01/18 0900  BP: (!) 104/51 (!) 97/36  Pulse: 78 80  Resp:    Temp:    SpO2: 100% 97%    General: alert. No acute distress  Cardiovascular: s1,s2 rrr Respiratory: no wheezing   Discharge Instructions  Discharge  Instructions    Diet - low sodium heart healthy   Complete by:  As directed    Increase activity slowly   Complete by:  As directed      Allergies as of 11/01/2018      Reactions   Omnipaque [iohexol] Nausea And Vomiting   Contrast Dye- Effects Kidney's      Medication List    STOP taking these medications   albuterol 108 (90 Base) MCG/ACT inhaler Commonly known as:  PROVENTIL HFA;VENTOLIN HFA    amLODipine 5 MG tablet Commonly known as:  NORVASC   carvedilol 6.25 MG tablet Commonly known as:  COREG   Darbepoetin Alfa 60 MCG/0.3ML Sosy injection Commonly known as:  ARANESP   ferrous sulfate 325 (65 FE) MG tablet   furosemide 80 MG tablet Commonly known as:  LASIX   HYDROcodone-acetaminophen 5-325 MG tablet Commonly known as:  NORCO/VICODIN   isosorbide-hydrALAZINE 20-37.5 MG tablet Commonly known as:  BIDIL   loratadine 10 MG tablet Commonly known as:  CLARITIN   oxyCODONE 5 MG immediate release tablet Commonly known as:  Oxy IR/ROXICODONE   pantoprazole 40 MG tablet Commonly known as:  PROTONIX Replaced by:  pantoprazole sodium 40 mg/20 mL Pack     TAKE these medications   acetaminophen 160 MG/5ML solution Commonly known as:  TYLENOL Place 20.3 mLs (650 mg total) into feeding tube every 6 (six) hours as needed for mild pain or fever.   allopurinol 100 MG tablet Commonly known as:  ZYLOPRIM Place 1 tablet (100 mg total) into feeding tube at bedtime. What changed:  how to take this   aspirin 81 MG tablet Place 1 tablet (81 mg total) into feeding tube daily. What changed:  how to take this   chlorhexidine gluconate (MEDLINE KIT) 0.12 % solution Commonly known as:  PERIDEX 15 mLs by Mouth Rinse route 2 (two) times daily.   feeding supplement (PRO-STAT SUGAR FREE 64) Liqd Place 30 mLs into feeding tube 3 (three) times daily.   feeding supplement (VITAL HIGH PROTEIN) Liqd liquid Place 1,000 mLs into feeding tube continuous.   insulin aspart 100 UNIT/ML injection Commonly known as:  novoLOG Inject 0-20 Units into the skin every 4 (four) hours. Insulin sliding scale What changed:    how much to take  when to take this  additional instructions   insulin glargine 100 UNIT/ML injection Commonly known as:  LANTUS Inject 0.3 mLs (30 Units total) into the skin daily. What changed:    how much to take  when to take this   ipratropium-albuterol  0.5-2.5 (3) MG/3ML Soln Commonly known as:  DUONEB Take 3 mLs by nebulization 3 (three) times daily.   midodrine 2.5 MG tablet Commonly known as:  PROAMATINE Take 1 tablet (2.5 mg total) by mouth 3 (three) times daily with meals.   multivitamin Liqd Place 15 mLs into feeding tube daily.   pantoprazole sodium 40 mg/20 mL Pack Commonly known as:  PROTONIX Place 20 mLs (40 mg total) into feeding tube daily. Replaces:  pantoprazole 40 MG tablet   rosuvastatin 10 MG tablet Commonly known as:  CRESTOR Place 1 tablet (10 mg total) into feeding tube every evening. What changed:    medication strength  how much to take  how to take this      Allergies  Allergen Reactions  . Omnipaque [Iohexol] Nausea And Vomiting    Contrast Dye- Effects Kidney's      The results of significant diagnostics from this hospitalization (including  imaging, microbiology, ancillary and laboratory) are listed below for reference.    Significant Diagnostic Studies: Ct Head Wo Contrast  Result Date: 10/07/2018 CLINICAL DATA:  Altered mental status. EXAM: CT HEAD WITHOUT CONTRAST TECHNIQUE: Contiguous axial images were obtained from the base of the skull through the vertex without intravenous contrast. COMPARISON:  07/02/2015 FINDINGS: Brain: No acute intracranial abnormality. Specifically, no hemorrhage, hydrocephalus, mass lesion, acute infarction, or significant intracranial injury. Vascular: No hyperdense vessel or unexpected calcification. Skull: No acute calvarial abnormality. Sinuses/Orbits: No acute finding Other: None IMPRESSION: No acute intracranial abnormality. Electronically Signed   By: Rolm Baptise M.D.   On: 10/07/2018 22:24   Ct Angio Chest Pe W Or Wo Contrast  Result Date: 10/25/2018 CLINICAL DATA:  Followup for cardiac arrest. Patient with a tracheostomy tube. EXAM: CT ANGIOGRAPHY CHEST WITH CONTRAST TECHNIQUE: Multidetector CT imaging of the chest was performed using the standard  protocol during bolus administration of intravenous contrast. Multiplanar CT image reconstructions and MIPs were obtained to evaluate the vascular anatomy. CONTRAST:  62m ISOVUE-370 IOPAMIDOL (ISOVUE-370) INJECTION 76% COMPARISON:  Chest radiograph, 10/23/2018 and older exams. FINDINGS: Cardiovascular: The opacification of the pulmonary arteries beyond the branch points into the segmental pulmonary arteries is suboptimal. Pulmonary emboli cannot be excluded in the segmental and smaller branches. Allowing for this, there is no evidence of a large, central pulmonary embolus. Heart is mildly enlarged. No pericardial effusion. There are coronary artery calcifications. No aortic dissection. No aortic atherosclerosis. Mediastinum/Nodes: Well-positioned tracheostomy tube. Nasal/orogastric tube passes into the stomach. No neck base, axillary, mediastinal or hilar masses or enlarged lymph nodes. Trachea is patent. Lungs/Pleura: There is dependent lower lobe atelectasis. Mild bilateral hazy ground-glass opacities are noted most evident in the upper lobes. This is likely due to the expiratory nature of fell to air trapping. Bilateral infection/inflammation is not excluded but felt less likely. 8 x 6 mm, mean 7 mm, pleural-based left upper lobe nodule, image 24, series 9. No other nodules. No pleural effusion.  No pneumothorax. Upper Abdomen: No acute abnormality. Musculoskeletal: No fracture or acute finding. There are degenerative changes along the thoracic spine. There are ill-defined areas of relative lucency, with multiple Schmorl's nodes. No definite lytic bone lesion. Review of the MIP images confirms the above findings. IMPRESSION: 1. Limited exam. Segmental and smaller pulmonary arteries not assessed for pulmonary emboli. Allowing for this, there is no large/central pulmonary embolus. 2. Dependent atelectasis in the lower lobes. Patchy areas of ground-glass opacity most evident in the upper lobes, most likely shunting  related small airways disease in this expiratory study. Consider infection/inflammation if there are consistent clinical findings. No evidence of pulmonary edema. Electronically Signed   By: DLajean ManesM.D.   On: 10/25/2018 18:10   Ct Pelvis Wo Contrast  Result Date: 10/31/2018 CLINICAL DATA:  Postmenopausal bleeding. EXAM: CT PELVIS WITHOUT CONTRAST TECHNIQUE: Multidetector CT imaging of the pelvis was performed following the standard protocol without intravenous contrast. COMPARISON:  None. FINDINGS: Urinary Tract: No hydroureteronephrosis. The urinary bladder is unremarkable for the degree of distention. Bowel: Enteric contrast reaches the rectum. No bowel obstruction is seen. Small and large bowel loops are seen within the patient's pannus pannus. Vascular/Lymphatic: Nonaneurysmal distal abdominal aorta with atherosclerosis of the common iliac arteries and branch vessels. Reproductive: Fibroid appearing uterus with intramural scattered coarse calcifications likely representing areas of fibroid degeneration. No adnexal mass. Other: Mild soft tissue anasarca. Layering gallstones of the included gallbladder without wall thickening or pericholecystic fluid. Musculoskeletal: No suspicious bone  lesions identified. IMPRESSION: 1. No acute bowel obstruction or inflammation. 2. Fibroid appearing uterus. 3. Mild soft tissue anasarca. 4. Cholelithiasis without acute cholecystitis. Electronically Signed   By: Ashley Royalty M.D.   On: 10/31/2018 00:02   Ir Fluoro Guide Cv Line Left  Result Date: 10/26/2018 INDICATION: 57 year old female with end-stage renal disease in need of hemodialysis. She presents for placement of a tunneled hemodialysis catheter. EXAM: TUNNELED CENTRAL VENOUS HEMODIALYSIS CATHETER PLACEMENT WITH ULTRASOUND AND FLUOROSCOPIC GUIDANCE MEDICATIONS: Patient currently receiving cefepime as an inpatient. No additional antibiotic prophylaxis was administered. ANESTHESIA/SEDATION: Moderate (conscious)  sedation was employed during this procedure. A total of Versed 1 mg and Fentanyl 50 mcg was administered intravenously. Moderate Sedation Time: 20 minutes. The patient's level of consciousness and vital signs were monitored continuously by radiology nursing throughout the procedure under my direct supervision. FLUOROSCOPY TIME:  Fluoroscopy Time: 0 minutes 42 seconds (9 mGy). COMPLICATIONS: None immediate. PROCEDURE: Informed written consent was obtained from the patient after a discussion of the risks, benefits, and alternatives to treatment. Questions regarding the procedure were encouraged and answered. The left neck and chest were prepped with chlorhexidine in a sterile fashion, and a sterile drape was applied covering the operative field. Maximum barrier sterile technique with sterile gowns and gloves were used for the procedure. A timeout was performed prior to the initiation of the procedure. After creating a small venotomy incision, a micropuncture kit was utilized to access the left internal jugular vein under direct, real-time ultrasound guidance after the overlying soft tissues were anesthetized with 1% lidocaine with epinephrine. Ultrasound image documentation was performed. The microwire was kinked to measure appropriate catheter length. A stiff Glidewire was advanced to the level of the IVC and the micropuncture sheath was exchanged for a peel-away sheath. A palindrome tunneled hemodialysis catheter measuring 28 cm from tip to cuff was tunneled in a retrograde fashion from the anterior chest wall to the venotomy incision. The catheter was then placed through the peel-away sheath with tips ultimately positioned within the superior aspect of the right atrium. Final catheter positioning was confirmed and documented with a spot radiographic image. The catheter aspirates and flushes normally. The catheter was flushed with appropriate volume heparin dwells. The catheter exit site was secured with a 0-Prolene  retention suture. The venotomy incision was sealed with Dermabond. Dressings were applied. The patient tolerated the procedure well without immediate post procedural complication. IMPRESSION: Successful placement of 28 cm tip to cuff tunneled hemodialysis catheter via the left internal jugular vein with tips terminating within the superior aspect of the right atrium. The catheter is ready for immediate use. Signed, Criselda Peaches, MD, [place] [facility] Radiology Electronically Signed   By: Jacqulynn Cadet M.D.   On: 10/26/2018 13:38   Ir US Guide Vasc Access Left  Result Date: 10/26/2018 INDICATION: 57 year old female with end-stage renal disease in need of hemodialysis. She presents for placement of a tunneled hemodialysis catheter. EXAM: TUNNELED CENTRAL VENOUS HEMODIALYSIS CATHETER PLACEMENT WITH ULTRASOUND AND FLUOROSCOPIC GUIDANCE MEDICATIONS: Patient currently receiving cefepime as an inpatient. No additional antibiotic prophylaxis was administered. ANESTHESIA/SEDATION: Moderate (conscious) sedation was employed during this procedure. A total of Versed 1 mg and Fentanyl 50 mcg was administered intravenously. Moderate Sedation Time: 20 minutes. The patient's level of consciousness and vital signs were monitored continuously by radiology nursing throughout the procedure under my direct supervision. FLUOROSCOPY TIME:  Fluoroscopy Time: 0 minutes 42 seconds (9 mGy). COMPLICATIONS: None immediate. PROCEDURE: Informed written consent  was obtained from the patient after a discussion of the risks, benefits, and alternatives to treatment. Questions regarding the procedure were encouraged and answered. The left neck and chest were prepped with chlorhexidine in a sterile fashion, and a sterile drape was applied covering the operative field. Maximum barrier sterile technique with sterile gowns and gloves were used for the procedure. A timeout was performed prior  to the initiation of the procedure. After creating a small venotomy incision, a micropuncture kit was utilized to access the left internal jugular vein under direct, real-time ultrasound guidance after the overlying soft tissues were anesthetized with 1% lidocaine with epinephrine. Ultrasound image documentation was performed. The microwire was kinked to measure appropriate catheter length. A stiff Glidewire was advanced to the level of the IVC and the micropuncture sheath was exchanged for a peel-away sheath. A palindrome tunneled hemodialysis catheter measuring 28 cm from tip to cuff was tunneled in a retrograde fashion from the anterior chest wall to the venotomy incision. The catheter was then placed through the peel-away sheath with tips ultimately positioned within the superior aspect of the right atrium. Final catheter positioning was confirmed and documented with a spot radiographic image. The catheter aspirates and flushes normally. The catheter was flushed with appropriate volume heparin dwells. The catheter exit site was secured with a 0-Prolene retention suture. The venotomy incision was sealed with Dermabond. Dressings were applied. The patient tolerated the procedure well without immediate post procedural complication. IMPRESSION: Successful placement of 28 cm tip to cuff tunneled hemodialysis catheter via the left internal jugular vein with tips terminating within the superior aspect of the right atrium. The catheter is ready for immediate use. Signed, Criselda Peaches, MD, [place] [facility] Radiology Electronically Signed   By: Jacqulynn Cadet M.D.   On: 10/26/2018 13:38   Dg Chest Port 1 View  Result Date: 10/23/2018 CLINICAL DATA:  Acute respiratory failure. EXAM: PORTABLE CHEST 1 VIEW COMPARISON:  Radiograph of October 19, 2018. FINDINGS: Stable cardiomegaly. Tracheostomy and nasogastric tubes are unchanged in position. Stable right internal  jugular catheter is noted. Stable central pulmonary vascular congestion and possible pulmonary edema is noted. No pneumothorax or pleural effusion is noted. Bony thorax is unremarkable. IMPRESSION: Stable support apparatus. Stable cardiomegaly with central pulmonary vascular congestion and possible pulmonary edema. Electronically Signed   By: Marijo Conception, M.D.   On: 10/23/2018 07:31   Dg Chest Port 1 View  Result Date: 10/19/2018 CLINICAL DATA:  Cardiac arrest. EXAM: PORTABLE CHEST 1 VIEW COMPARISON:  Chest x-ray from same day at 14:56. FINDINGS: Unchanged tracheostomy tube, enteric tube, and right internal jugular catheter. Stable cardiomegaly and central pulmonary vascular congestion. New hazy opacities throughout both lungs. No pleural effusion or pneumothorax. No acute osseous abnormality. IMPRESSION: 1. New hazy opacities throughout both lungs, suspicious for pulmonary edema. Electronically Signed   By: Titus Dubin M.D.   On: 10/19/2018 16:20   Dg Chest Port 1 View  Result Date: 10/19/2018 CLINICAL DATA:  Post tracheostomy. EXAM: PORTABLE CHEST 1 VIEW COMPARISON:  None. FINDINGS: New tracheostomy tube projects over the upper trachea. No evidence of pneumomediastinum. No pneumothorax. Right IJ catheter tip terminates in the distal SVC. The left IJ approach central line has been removed. Gastric tube extends below the left hemidiaphragm. Lung volumes remain low. There is stable cardiomegaly with central vascular congestion and left basilar atelectasis. IMPRESSION: Low lung volumes with stable cardiomegaly and central vascular congestion. Support lines and tubes as above in  satisfactory position. No pneumothorax or pneumomediastinum. Electronically Signed   By: Ashley Royalty M.D.   On: 10/19/2018 15:13   Dg Chest Port 1 View  Result Date: 10/18/2018 CLINICAL DATA:  Respiratory distress EXAM: PORTABLE CHEST 1 VIEW COMPARISON:  10/17/2018 FINDINGS: Endotracheal tube, nasogastric catheter and right  and left jugular lines are again identified and stable. Increased vascular congestion is noted when compare with the prior exam. Mild left basilar atelectasis is noted increased from the prior study. No pneumothorax is seen. No bony abnormality is noted. IMPRESSION: Tubes and lines stable from the prior exam. No pneumothorax is noted. Increasing left basilar atelectasis. Increased vascular congestion is noted. Electronically Signed   By: Inez Catalina M.D.   On: 10/18/2018 08:42   Dg Chest Port 1 View  Result Date: 10/17/2018 CLINICAL DATA:  Hypoxia EXAM: PORTABLE CHEST 1 VIEW COMPARISON:  October 16, 2018 FINDINGS: Endotracheal tube tip is 4.3 cm above the carina. Nasogastric tube tip and side port below the diaphragm. Right jugular catheter tip is in the superior vena cava. Left jugular catheter tip is at the junction of the left innominate vein and superior vena cava. No pneumothorax. There is mild bibasilar atelectasis. Lungs elsewhere are clear. The heart is upper normal in size with pulmonary vascularity normal. No adenopathy. No bone lesions. IMPRESSION: Tube and catheter positions as described without pneumothorax. Mild bibasilar atelectasis. No consolidation. Stable cardiac silhouette. Electronically Signed   By: Lowella Grip III M.D.   On: 10/17/2018 07:10   Dg Chest Port 1 View  Result Date: 10/16/2018 CLINICAL DATA:  Acute respiratory failure EXAM: PORTABLE CHEST 1 VIEW COMPARISON:  10/13/2018 FINDINGS: Endotracheal tube in good position and unchanged. Right jugular central venous catheter tip in the SVC. Left jugular central venous catheter tip in the SVC unchanged. NG tube enters the stomach with the tip not visualized. Mild bibasilar airspace disease has improved in the interval. Negative for edema or effusion. IMPRESSION: Support lines remain in good position. Bibasilar airspace disease with interval improvement. Electronically Signed   By: Franchot Gallo M.D.   On: 10/16/2018 09:40    Dg Chest Port 1 View  Result Date: 10/13/2018 CLINICAL DATA:  Hypoxia EXAM: PORTABLE CHEST 1 VIEW COMPARISON:  October 13, 2018 FINDINGS: Endotracheal tube tip is 3.7 cm above the carina. Nasogastric tube tip and side port are below the diaphragm. Right jugular catheter tip is in the superior vena cava. Left jugular catheter tip is in the left innominate vein near the junction with the superior vena cava. No pneumothorax. There is atelectatic change in the right mid lung. There is a small focus of airspace consolidation in the medial right base. Lungs elsewhere are clear. There is cardiomegaly with pulmonary vascularity normal. No adenopathy. No bone lesions. IMPRESSION: Tube and catheter positions as described without pneumothorax. Small focus of consolidation medial right base, felt to represent a focal area of pneumonia. Atelectasis right mid lung. Left lung clear. Heart mildly enlarged. Electronically Signed   By: Lowella Grip III M.D.   On: 10/13/2018 23:19   Dg Chest Port 1 View  Result Date: 10/13/2018 CLINICAL DATA:  57 year old female with respiratory failure status post cardiac arrest. EXAM: PORTABLE CHEST 1 VIEW COMPARISON:  10/12/2018 and earlier. FINDINGS: Portable AP semi upright view at 0504 hours. Stable endotracheal tube tip just below the clavicles. Enteric tube courses to the left abdomen, tip not included. Bilateral IJ approach central lines remain in place. Pacer or resuscitation pads project over the left  chest. Mildly lower lung volumes and more kyphotic view. Stable cardiac size and mediastinal contours. Coarse and indistinct bilateral pulmonary interstitial opacity persists. No pneumothorax or definite pleural effusion. Paucity of bowel gas in the upper abdomen. IMPRESSION: 1.  Stable lines and tubes. 2. Persistent coarse bilateral pulmonary interstitial opacity. Top differential considerations include interstitial edema, atypical respiratory infection, ARDS. No pleural  effusion is evident. Electronically Signed   By: Genevie Ann M.D.   On: 10/13/2018 07:45   Dg Chest Port 1 View  Result Date: 10/12/2018 CLINICAL DATA:  Hypoxia EXAM: PORTABLE CHEST 1 VIEW COMPARISON:  October 12, 2018 FINDINGS: Endotracheal tube tip is 5.5 cm above the carina. Nasogastric tube tip and side port are below the diaphragm. Right central catheter tip is in the superior vena cava. Left central catheter tip is in left innominate vein. No pneumothorax. There is no edema or consolidation. Heart is mildly enlarged with pulmonary vascularity normal. No adenopathy. No evident bone lesions. IMPRESSION: Tube and catheter positions as described without pneumothorax. No edema or consolidation. Mild cardiac enlargement. Electronically Signed   By: Lowella Grip III M.D.   On: 10/12/2018 20:07   Dg Chest Port 1 View  Result Date: 10/12/2018 CLINICAL DATA:  Central line placement. EXAM: PORTABLE CHEST 1 VIEW COMPARISON:  10/12/2018. FINDINGS: Interim placement of right IJ line. Its tip is over the superior vena cava. Left IJ line in stable position. Endotracheal tube and NG tube in stable position. Cardiomegaly. Pulmonary vascular prominence. Mild right base infiltrate. No pleural effusion or pneumothorax. IMPRESSION: 1. Interim placement right IJ line, its tip is over the superior vena cava. Endotracheal tube, NG tube, left IJ line stable position. 2.  Cardiomegaly with pulmonary venous congestion. 3.  Right base infiltrate. Electronically Signed   By: Marcello Moores  Register   On: 10/12/2018 17:04   Dg Chest Port 1 View  Result Date: 10/12/2018 CLINICAL DATA:  Intubation.  Respiratory failure. EXAM: PORTABLE CHEST 1 VIEW COMPARISON:  10/09/2018. FINDINGS: Endotracheal tube has been withdrawn and its tip is now 2.7 cm above the carina. NG tube noted with tip below left hemidiaphragm. Left IJ line noted with tip over the proximal SVC. Cardiomegaly with bilateral pulmonary venous congestion and  infiltrates/edema. No pleural effusion or pneumothorax. IMPRESSION: 1. Endotracheal tube has been withdrawn, its tip is now 2.7 cm above the carina. NG tube and left IJ line stable position. 2. Cardiomegaly with bilateral pulmonary infiltrates/edema. Small left pleural effusion. Similar findings noted on prior exam. Electronically Signed   By: Angelica   On: 10/12/2018 10:51   Dg Chest Port 1 View  Result Date: 10/09/2018 CLINICAL DATA:  Respiratory failure EXAM: PORTABLE CHEST 1 VIEW COMPARISON:  Yesterday FINDINGS: Endotracheal tube tip projects 13 mm above the carina, lower than before. An orogastric tube at least reaches the diaphragm. Left IJ line with tip the distal left brachiocephalic. Symmetric increased opacity in the bilateral perihilar and lower chest. No Kerley lines or effusion. No pneumothorax. There is cardiopericardial enlargement. IMPRESSION: Lower endotracheal tube, tip 13 mm above the carina. Symmetric worsening aeration, likely edema. Electronically Signed   By: Monte Fantasia M.D.   On: 10/09/2018 10:02   Dg Chest Port 1 View  Result Date: 10/07/2018 CLINICAL DATA:  Central line placement EXAM: PORTABLE CHEST 1 VIEW COMPARISON:  10/07/2018 FINDINGS: Left central line remains in the upper mediastinum near the confluence of the innominate veins, unchanged. No pneumothorax. Endotracheal tube is 4 cm above the carina. NG tube enters  the stomach. Cardiomegaly. No confluent opacities or edema. No effusions or acute bony abnormality. IMPRESSION: Left central line tip remains near the confluence of the innominate veins. No pneumothorax. Cardiomegaly.  No overt edema. Electronically Signed   By: Rolm Baptise M.D.   On: 10/07/2018 21:29   Dg Chest Portable 1 View  Result Date: 10/07/2018 CLINICAL DATA:  Central line placement. EXAM: PORTABLE CHEST 1 VIEW COMPARISON:  Earlier this day at 2019 hour FINDINGS: Tip of the left internal jugular central venous catheter in the region of  the brachiocephalic/SVC confluence. No visualized pneumothorax, however the lung apices are not entirely included in the field of view. Endotracheal tube tip at the thoracic inlet 4.7 cm from the carina. Enteric tube in place with tip below the diaphragm. Low lung volumes persist. Unchanged cardiomegaly. Vascular congestion is similar. No large pleural effusion. IMPRESSION: 1. Tip of the left internal jugular central venous catheter in the region of the brachiocephalic/SVC confluence. No visualized pneumothorax. 2. Low lung volumes with unchanged cardiomegaly and vascular congestion. Electronically Signed   By: Keith Rake M.D.   On: 10/07/2018 21:29   Dg Chest Portable 1 View  Result Date: 10/07/2018 CLINICAL DATA:  Post intubation EXAM: PORTABLE CHEST 1 VIEW COMPARISON:  10/07/2018 FINDINGS: Endotracheal tube is 2 cm above the carina. NG tube enters the stomach. Cardiomegaly with vascular congestion, similar to prior study. No confluent opacities, effusions or pneumothorax. No definite visible rib fracture as questioned on prior chest x-ray. IMPRESSION: Endotracheal tube 2 cm above the carina. Mild cardiomegaly, vascular congestion. Electronically Signed   By: Rolm Baptise M.D.   On: 10/07/2018 20:39   Dg Chest Portable 1 View  Result Date: 10/07/2018 CLINICAL DATA:  CPR EXAM: PORTABLE CHEST 1 VIEW COMPARISON:  05/12/2018 FINDINGS: Heart size is mildly enlarged. Mild vascular congestion. No confluent opacities, effusions or pneumothorax. Suspect left lateral rib fractures, but difficult to visualize. IMPRESSION: Cardiomegaly, vascular congestion.  No effusion or pneumothorax. Suspect left lateral rib fractures, but difficult to visualize and characterize. Electronically Signed   By: Rolm Baptise M.D.   On: 10/07/2018 19:56   Vas [place] Lower Extremity Venous (dvt)  Result Date: 10/08/2018  Lower Venous Study Indications: Edema.  Limitations: Poor ultrasound/tissue interface, body habitus and  patient positioning. Performing Technologist: Abram Sander  Examination Guidelines: A complete evaluation includes B-mode imaging, spectral Doppler, color Doppler, and power Doppler as needed of all accessible portions of each vessel. Bilateral testing is considered an integral part of a complete examination. Limited examinations for reoccurring indications may be performed as noted.  Right Venous Findings: +---------+---------------+---------+-----------+----------+--------------+          CompressibilityPhasicitySpontaneityPropertiesSummary        +---------+---------------+---------+-----------+----------+--------------+ CFV      Full           Yes      Yes                                 +---------+---------------+---------+-----------+----------+--------------+ SFJ      Full                                                        +---------+---------------+---------+-----------+----------+--------------+ FV Prox  Full                                                        +---------+---------------+---------+-----------+----------+--------------+  FV Mid   Full                                                        +---------+---------------+---------+-----------+----------+--------------+ FV DistalFull                                                        +---------+---------------+---------+-----------+----------+--------------+ PFV      Full                                                        +---------+---------------+---------+-----------+----------+--------------+ POP      Full           Yes      Yes                                 +---------+---------------+---------+-----------+----------+--------------+ PTV      Full                                                        +---------+---------------+---------+-----------+----------+--------------+ PERO                                                  not visualized  +---------+---------------+---------+-----------+----------+--------------+  Left Venous Findings: +---------+---------------+---------+-----------+----------+--------------+          CompressibilityPhasicitySpontaneityPropertiesSummary        +---------+---------------+---------+-----------+----------+--------------+ CFV      Full           Yes      Yes                                 +---------+---------------+---------+-----------+----------+--------------+ SFJ                                                   not visualized +---------+---------------+---------+-----------+----------+--------------+ FV Prox  Full                                                        +---------+---------------+---------+-----------+----------+--------------+ FV Mid   Full                                                        +---------+---------------+---------+-----------+----------+--------------+  FV DistalFull                                                        +---------+---------------+---------+-----------+----------+--------------+ PFV      Full                                                        +---------+---------------+---------+-----------+----------+--------------+ POP      Full           Yes      Yes                                 +---------+---------------+---------+-----------+----------+--------------+ PTV      Full                                                        +---------+---------------+---------+-----------+----------+--------------+ PERO                                                  not visualized +---------+---------------+---------+-----------+----------+--------------+    Summary: Right: There is no evidence of deep vein thrombosis in the lower extremity. However, portions of this examination were limited- see technologist comments above. No cystic structure found in the popliteal fossa. Left: There is no evidence of  deep vein thrombosis in the lower extremity. However, portions of this examination were limited- see technologist comments above. No cystic structure found in the popliteal fossa.  *See table(s) above for measurements and observations. Electronically signed by Ruta Hinds MD on 10/08/2018 at 8:38:38 PM.    Final     Microbiology: No results found for this or any previous visit (from the past 240 hour(s)).   Labs: Basic Metabolic Panel: Recent Labs  Lab 10/27/18 0402 10/28/18 5102 10/29/18 0327 10/30/18 0247 10/31/18 0319 11/01/18 0600  NA 135 136 138 135 137 133*  K 4.0 4.3 4.5 3.7 4.3 3.8  CL 99 100 99 96* 97* 93*  CO2 24 21* _0 GLUCOSE 129* 122* 101* 129* 119* 130*  BUN 40* 64* 81* 49* 70* 54*  CREATININE 4.07* 5.46* 6.62* 4.26* 5.48* 4.40*  CALCIUM 9.0 9.6 9.9 9.2 9.7 9.9  MG 2.4 2.3 2.4 2.2 2.2  --   PHOS 4.8* 5.9* 7.2* 4.0 5.4*  --    Liver Function Tests: Recent Labs  Lab 10/27/18 0402 10/28/18 0632 10/29/18 0327 10/30/18 0247 10/31/18 0319  ALBUMIN 1.9* 2.0* 2.0* 1.9* 1.9*   No results for input(s): LIPASE, AMYLASE in the last 168 hours. No results for input(s): AMMONIA in the last 168 hours. CBC: Recent Labs  Lab 10/29/18 0327 10/30/18 0247 10/30/18 1320 10/31/18 0319 11/01/18 0600  WBC 9.1 10.4 10.8* 11.0* 11.1*  HGB 7.1* 6.3* 7.1* 7.0* 8.5*  HCT 25.1* 22.4* 25.4* 24.7* 29.3*  MCV 89.3 90.0  90.1 89.8 88.8  PLT 275 212 220 245 200   Cardiac Enzymes: No results for input(s): CKTOTAL, CKMB, CKMBINDEX, TROPONINI in the last 168 hours. BNP: BNP (last 3 results) Recent Labs    05/13/18 0333  BNP 78.9    ProBNP (last 3 results) No results for input(s): PROBNP in the last 8760 hours.  CBG: Recent Labs  Lab 10/31/18 1607 10/31/18 1953 11/01/18 0030 11/01/18 0425 11/01/18 0803  GLUCAP 122* 109* 125* 144* 112*       Signed:  Rowe Clack N  [facility] 11/01/2018, 10:54 AM

## 2018-11-01 NOTE — Progress Notes (Signed)
Physical Therapy Treatment Patient Details Name: Catherine Williams MRN: [id number] DOB: 26-May-1961 Today's Date: 11/01/2018    History of Present Illness Pt is a 57 y.o. F with significant PMH of obesity with OSA and CKD stage V admitted on 10/20 after PEA cardiac arrest and intubated. Underwent hypothermia protocol and started on CRRT. 10/26 sustained another brief PEA arrest and underwent bronchoscopy. Had 2 more arrests on 10/30 and 11/1 post tracheostomy placement, cardiac cath negative for obstructive CAD. Also has sacral stage 2 ulcer.    PT Comments    Patient remains on ventilator. Session focused on transferring from bed to bariatric chair via maxisky. Requiring max assist for limited bed mobility (rolling to both sides to place lift pad). Pt able to now communicate via writing and was excited to be out of bed.     Follow Up Recommendations  LTACH     Equipment Recommendations  Other (comment)(TBA)    Recommendations for Other Services       Precautions / Restrictions Precautions Precautions: Fall Precaution Comments: Vent Restrictions Weight Bearing Restrictions: No    Mobility  Bed Mobility Overal bed mobility: Needs Assistance Bed Mobility: Rolling Rolling: Max assist         General bed mobility comments: assist to roll to both sides for placement of lift pad  Transfers                 General transfer comment: lift to bari chair with assist of RN managing vent  Ambulation/Gait                 Stairs             Wheelchair Mobility    Modified Rankin (Stroke Patients Only)       Balance Overall balance assessment: Needs assistance   Sitting balance-Leahy Scale: Fair Sitting balance - Comments: min guard assist with pt sitting at edge of bari chair                                    Cognition Arousal/Alertness: Awake/alert Behavior During Therapy: WFL for tasks assessed/performed Overall Cognitive Status:  Difficult to assess                                 General Comments: pt now able to write to communicate, following commands with increased time      Exercises      General Comments  VSS on vent      Pertinent Vitals/Pain Pain Assessment: Faces Faces Pain Scale: No hurt  Pertinent Vitals: 105/56 prior mobility, 113/55 post mobility    Home Living                      Prior Function            PT Goals (current goals can now be found in the care plan section) Acute Rehab PT Goals Patient Stated Goal: pt smiling once up in chair Potential to Achieve Goals: Fair Progress towards PT goals: Progressing toward goals    Frequency    Min 2X/week      PT Plan Current plan remains appropriate    Co-evaluation PT/OT/SLP Co-Evaluation/Treatment: Yes Reason for Co-Treatment: Complexity of the patient's impairments (multi-system involvement);For patient/therapist safety;To address functional/ADL transfers PT goals addressed during session: Mobility/safety with mobility OT goals  addressed during session: Strengthening/ROM      AM-PAC PT "6 Clicks" Daily Activity  Outcome Measure  Difficulty turning over in bed (including adjusting bedclothes, sheets and blankets)?: Unable Difficulty moving from lying on back to sitting on the side of the bed? : Unable Difficulty sitting down on and standing up from a chair with arms (e.g., wheelchair, bedside commode, etc,.)?: Unable Help needed moving to and from a bed to chair (including a wheelchair)?: Total Help needed walking in hospital room?: Total Help needed climbing 3-5 steps with a railing? : Total 6 Click Score: 6    End of Session Equipment Utilized During Treatment: Oxygen Activity Tolerance: Patient tolerated treatment well Patient left: in chair;with call bell/phone within reach;with nursing/sitter in room Nurse Communication: Mobility status PT Visit Diagnosis: Other abnormalities of gait and  mobility (R26.89);Muscle weakness (generalized) (M62.81)     Time: 1020-1057 PT Time Calculation (min) (ACUTE ONLY): 37 min  Charges:  $Therapeutic Activity: 8-22 mins                    Ellamae Sia, PT, DPT Acute Rehabilitation Services Pager [phone] Office [phone]   Willy Eddy 11/01/2018, 1:43 PM

## 2018-11-01 NOTE — Progress Notes (Signed)
Occupational Therapy Treatment Patient Details Name: Catherine Williams MRN: [id number] DOB: 1961-10-22 Today's Date: 11/01/2018    History of present illness Pt is a 57 y.o. F with significant PMH of obesity with OSA and CKD stage V admitted on 10/20 after PEA cardiac arrest and intubated. Underwent hypothermia protocol and started on CRRT. 10/26 sustained another brief PEA arrest and underwent bronchoscopy. Had 2 more arrests on 10/30 and 11/1 post tracheostomy placement, cardiac cath negative for obstructive CAD. Also has sacral stage 2 ulcer.   OT comments  Goal of session was to transfer pt to barichair using maxisky. VSS throughout on ventilator. Pt fatigued after lifted to chair, needing assist to position hips back in chair. Pt communicating through writing and pleased to be OOB.   Follow Up Recommendations  LTACH;Supervision/Assistance - 24 hour    Equipment Recommendations       Recommendations for Other Services      Precautions / Restrictions Precautions Precautions: Fall Precaution Comments: Vent Restrictions Weight Bearing Restrictions: No       Mobility Bed Mobility Overal bed mobility: Needs Assistance Bed Mobility: Rolling Rolling: Max assist         General bed mobility comments: assist to roll for placement of lift pad  Transfers                 General transfer comment: lift to bari chair with assist of RN managing vent    Balance Overall balance assessment: Needs assistance   Sitting balance-Leahy Scale: Fair Sitting balance - Comments: min guard assist with pt sitting at edge of bari chair                                   ADL either performed or assessed with clinical judgement   ADL Overall ADL's : Needs assistance/impaired                 Upper Body Dressing : Total assistance   Lower Body Dressing: Total assistance                 General ADL Comments: focus of session on getting pt up in chair with  lift     Vision       Perception     Praxis      Cognition Arousal/Alertness: Awake/alert Behavior During Therapy: WFL for tasks assessed/performed Overall Cognitive Status: Difficult to assess                                 General Comments: pt now able to write to communicate, following commands with increased time        Exercises     Shoulder Instructions       General Comments      Pertinent Vitals/ Pain       Pain Assessment: Faces Faces Pain Scale: No hurt  Home Living                                          Prior Functioning/Environment              Frequency  Min 2X/week        Progress Toward Goals  OT Goals(current goals can now be found in the care plan section)  Progress towards OT goals: Progressing toward goals  Acute Rehab OT Goals Patient Stated Goal: pt smiling once up in chair OT Goal Formulation: Patient unable to participate in goal setting Time For Goal Achievement: 11/08/18 Potential to Achieve Goals: [place] Discharge plan remains appropriate    Co-evaluation    PT/OT/SLP Co-Evaluation/Treatment: Yes Reason for Co-Treatment: Complexity of the patient's impairments (multi-system involvement);For patient/therapist safety   OT goals addressed during session: Strengthening/ROM      AM-PAC PT "6 Clicks" Daily Activity     Outcome Measure   Help from another person eating meals?: Total Help from another person taking care of personal grooming?: A Lot Help from another person toileting, which includes using toliet, bedpan, or urinal?: Total Help from another person bathing (including washing, rinsing, drying)?: Total Help from another person to put on and taking off regular upper body clothing?: Total Help from another person to put on and taking off regular lower body clothing?: Total 6 Click Score: 7    End of Session Equipment Utilized During Treatment: Other (comment)(vent,  lift equipment)  OT Visit Diagnosis: Pain;Muscle weakness (generalized) (M62.81)   Activity Tolerance Patient tolerated treatment well   Patient Left in chair;with call bell/phone within reach;with nursing/sitter in room   Nurse Communication Need for lift equipment        Time: 8350-7573 OT Time Calculation (min): 34 min  Charges: OT General Charges $OT Visit: 1 Visit OT Treatments $Therapeutic Activity: 8-22 mins  Nestor Lewandowsky, OTR/L Acute Rehabilitation Services Pager: [phone] Office: [phone]   Malka So 11/01/2018, 11:12 AM

## 2018-11-01 NOTE — Progress Notes (Signed)
Patient discharged to [facility] room 16.  Report called to [employer].  Patient's daughter [place] at bedside and aware of patient transfer.  All belongings transferred with patient.  Patient transferred with RN and RT with no complications.

## 2018-11-02 DIAGNOSIS — N186 End stage renal disease: Secondary | ICD-10-CM

## 2018-11-02 DIAGNOSIS — J189 Pneumonia, unspecified organism: Secondary | ICD-10-CM

## 2018-11-02 DIAGNOSIS — J398 Other specified diseases of upper respiratory tract: Secondary | ICD-10-CM

## 2018-11-02 DIAGNOSIS — I5032 Chronic diastolic (congestive) heart failure: Secondary | ICD-10-CM

## 2018-11-02 DIAGNOSIS — Z992 Dependence on renal dialysis: Secondary | ICD-10-CM

## 2018-11-02 DIAGNOSIS — J9621 Acute and chronic respiratory failure with hypoxia: Secondary | ICD-10-CM

## 2018-11-02 DIAGNOSIS — I469 Cardiac arrest, cause unspecified: Secondary | ICD-10-CM

## 2018-11-02 LAB — CBC
HCT: 30 % — ABNORMAL LOW (ref 36.0–46.0)
Hemoglobin: 8.8 g/dL — ABNORMAL LOW (ref 12.0–15.0)
MCH: 25.7 pg — ABNORMAL LOW (ref 26.0–34.0)
MCHC: 29.3 g/dL — ABNORMAL LOW (ref 30.0–36.0)
MCV: 87.5 fL (ref 80.0–100.0)
PLATELETS: 234 10*3/uL (ref 150–400)
RBC: 3.43 MIL/uL — AB (ref 3.87–5.11)
RDW: 16 % — ABNORMAL HIGH (ref 11.5–15.5)
WBC: 11.9 10*3/uL — AB (ref 4.0–10.5)
nRBC: 0 % (ref 0.0–0.2)

## 2018-11-02 LAB — BASIC METABOLIC PANEL
Anion gap: 16 — ABNORMAL HIGH (ref 5–15)
BUN: 80 mg/dL — ABNORMAL HIGH (ref 6–20)
CALCIUM: 10 mg/dL (ref 8.9–10.3)
CO2: 25 mmol/L (ref 22–32)
Chloride: 93 mmol/L — ABNORMAL LOW (ref 98–111)
Creatinine, Ser: 6.07 mg/dL — ABNORMAL HIGH (ref 0.44–1.00)
GFR calc non Af Amer: 7 mL/min — ABNORMAL LOW (ref >60)
GFR, EST AFRICAN AMERICAN: 8 mL/min — AB (ref >60)
GLUCOSE: 134 mg/dL — AB (ref 70–99)
POTASSIUM: 4 mmol/L (ref 3.5–5.1)
Sodium: 134 mmol/L — ABNORMAL LOW (ref 135–145)

## 2018-11-02 NOTE — Consult Note (Signed)
[facility] CONSULT NOTE    Date: 11/02/2018                  Patient Name:  Catherine Williams  MRN: [id number]  DOB: 1961-08-08  Age / Sex: 57 y.o., female         PCP: Vonna Drafts, FNP                 Service Requesting Consult: Hospitalist                 Reason for Consult: Acute renal failure/CKD IV            History of Present Illness: Patient is a 57 y.o. female with a PMHx of recurrent V. fib/PEA arrest, recent acute respiratory failure, COPD, obstructive sleep apnea, diastolic heart failure, multifactorial anemia, diabetes mellitus type 2, who was admitted to [facility] on 11/01/2018 for ongoing care.  Discharge summary from [facility] reviewed.  In brief patient unfortunately suffered multiple cardiac arrests which involved both ventricular fibrillation as well as PEA arrest.  Patient had cardiac catheterization without lesion that could be intervened upon.  She also had acute respiratory failure and now has tracheostomy in place.  She has known underlying acute renal failure with chronic kidney disease stage IV.  She was recently started on dialysis and appears to be tolerating this well.  Has multifactorial anemia that has required.   Medications: Outpatient medications: Medications Prior to Admission  Medication Sig Dispense Refill Last Dose  . acetaminophen (TYLENOL) 160 MG/5ML solution Place 20.3 mLs (650 mg total) into feeding tube every 6 (six) hours as needed for mild pain or fever. 120 mL 0   . allopurinol (ZYLOPRIM) 100 MG tablet Place 1 tablet (100 mg total) into feeding tube at bedtime. 30 tablet 6   . Amino Acids-Protein Hydrolys (FEEDING SUPPLEMENT, PRO-STAT SUGAR FREE 64,) LIQD Place 30 mLs into feeding tube 3 (three) times daily. 900 mL 0   . aspirin 81 MG tablet Place 1 tablet (81 mg total) into feeding tube daily. 30 tablet    . chlorhexidine gluconate, MEDLINE KIT, (PERIDEX) 0.12 % solution 15 mLs by Mouth Rinse route 2  (two) times daily. 120 mL 0   . insulin aspart (NOVOLOG) 100 UNIT/ML injection Inject 0-20 Units into the skin every 4 (four) hours. Insulin sliding scale 10 mL 11   . insulin glargine (LANTUS) 100 UNIT/ML injection Inject 0.3 mLs (30 Units total) into the skin daily. 10 mL 11   . ipratropium-albuterol (DUONEB) 0.5-2.5 (3) MG/3ML SOLN Take 3 mLs by nebulization 3 (three) times daily. 360 mL    . midodrine (PROAMATINE) 2.5 MG tablet Take 1 tablet (2.5 mg total) by mouth 3 (three) times daily with meals.     . Multiple Vitamin (MULTIVITAMIN) LIQD Place 15 mLs into feeding tube daily.     . Nutritional Supplements (FEEDING SUPPLEMENT, VITAL HIGH PROTEIN,) LIQD liquid Place 1,000 mLs into feeding tube continuous.     . pantoprazole sodium (PROTONIX) 40 mg/20 mL PACK Place 20 mLs (40 mg total) into feeding tube daily. 30 each    . rosuvastatin (CRESTOR) 10 MG tablet Place 1 tablet (10 mg total) into feeding tube every evening.       Current medications: Atorvastatin 10 mg nightly, Pepcid 20 mg daily, heparin 5000 units subcu every 8 hours, Lantus insulin 30 units daily, midodrine 2.5 mg 3 times daily, pro-stat 30 cc 3 times daily, multivitamin 1 tablet daily  Allergies: Allergies  Allergen Reactions  . Omnipaque [Iohexol] Nausea And Vomiting    Contrast Dye- Effects Kidney's      Past Medical History: Past Medical History:  Diagnosis Date  . Anemia   . Arthritis Dec. 2014   Gout  . Asthma   . CHF (congestive heart failure) ([place])   . COPD (chronic obstructive pulmonary disease) ([place])   . Diabetes mellitus   . GERD (gastroesophageal reflux disease)   . Heart failure   . Hyperlipidemia   . Hypertension   . Kidney disease   . Morbid obesity ([place])   . Peripheral vascular disease ([place])   . Pinched nerve   . Requires supplemental oxygen    as needed  . Sleep apnea    wears cpap  . Wears dentures      Past Surgical History: Past Surgical History:  Procedure Laterality Date  .  Atherectomy and angioplasty  10/18/2011   left posterior tibial artery  . AV FISTULA PLACEMENT Left 10/04/2018   Procedure: ARTERIOVENOUS (AV) FISTULA CREATION LEFT ARM;  Surgeon: Serafina Mitchell, MD;  Location: [facility];  Service: Vascular;  Laterality: Left;  . COLONOSCOPY    . HAND SURGERY     right  . IR FLUORO GUIDE CV LINE LEFT  10/26/2018  . IR US GUIDE VASC ACCESS LEFT  10/26/2018  . LEFT HEART CATH AND CORONARY ANGIOGRAPHY N/A 10/19/2018   Procedure: LEFT HEART CATH AND CORONARY ANGIOGRAPHY;  Surgeon: Leonie Man, MD;  Location: [facility];  Service: Cardiovascular;  Laterality: N/A;  . MULTIPLE TOOTH EXTRACTIONS    . OVARY SURGERY     Left  . TOE AMPUTATION  Sept. 25,2012   Left 4th and 5th toes     Family History: Family History  Problem Relation Age of Onset  . Lung cancer Mother   . Clotting disorder Mother   . Heart disease Mother   . Cancer Mother        Lung  . Deep vein thrombosis Mother   . Diabetes Mother   . Hypertension Mother   . Varicose Veins Mother   . Cancer Father   . Clotting disorder Sister   . Heart attack Sister   . Diabetes Sister   . Clotting disorder Brother   . Deep vein thrombosis Brother   . Diabetes Brother   . Heart disease Brother   . Heart disease Sister      Social History: Social History   Socioeconomic History  . Marital status: Single    Spouse name: Not on file  . Number of children: Y  . Years of education: Not on file  . Highest education level: Not on file  Occupational History  . 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  Social Needs  . Financial resource strain: Not on file  . Food insecurity:    Worry: Not on file    Inability: Not on file  . Transportation needs:    Medical: Not on file    Non-medical: Not on file  Tobacco Use  . Smoking status: Never Smoker  . Smokeless tobacco: Never Used  Substance and Sexual Activity  . Alcohol use: No     Alcohol/week: 0.0 standard drinks  . Drug use: Not Currently    Types: Marijuana    Comment: FORMER>>smoked weed from age 42 to 38>> quit at age 64   . Sexual activity: Not on file  Lifestyle  . Physical activity:    Days per week: Not on file    Minutes per session: Not on file  . Stress: Not on file  Relationships  . Social connections:    Talks on phone: Not on file    Gets together: Not on file    Attends religious service: Not on file    Active member of club or organization: Not on file    Attends meetings of clubs or organizations: Not on file    Relationship status: Not on file  . Intimate partner violence:    Fear of current or ex partner: Not on file    Emotionally abused: Not on file    Physically abused: Not on file    Forced sexual activity: Not on file  Other Topics Concern  . Not on file  Social History Narrative   Lives with daughter           Review of Systems: Cannot offer as she has a tracheostomy in place.  Vital Signs: Temperature 98.7 pulse 78 respiration 22 blood pressure 121/56 Weight trends: There were no vitals filed for this visit.  Physical Exam: General: NAD, laying in bed  Head: Normocephalic, atraumatic.  Eyes: Anicteric, EOMI  Nose: Mucous membranes moist, not inflammed, nonerythematous.  Throat: Oropharynx nonerythematous, no exudate appreciated.   Neck: Tracheostomy in place  Lungs:  Scattered rhonchi, vent assisted  Heart: RRR. S1 and S2 normal without gallop, murmur, or rubs.  Abdomen:  BS normoactive. Soft, Nondistended, non-tender.  No masses or organomegaly.  Extremities: Trace pretibial edema.  Neurologic: Awake, alert, follows commands  Skin: No visible rashes, scars.    Lab results: Basic Metabolic Panel: Recent Labs  Lab 10/27/18 0402 10/28/18 5597 10/29/18 0327 10/30/18 0247 10/31/18 0319 11/01/18 0600 11/02/18 1150  NA 135 136 138 135 137 133* 134*  K 4.0 4.3 4.5 3.7 4.3 3.8 4.0  CL 99 100 99 96* 97* 93*  93*  CO2 24 21* 25 27 27 26 25   GLUCOSE 129* 122* 101* 129* 119* 130* 134*  BUN 40* 64* 81* 49* 70* 54* 80*  CREATININE 4.07* 5.46* 6.62* 4.26* 5.48* 4.40* 6.07*  CALCIUM 9.0 9.6 9.9 9.2 9.7 9.9 10.0  MG 2.4 2.3 2.4 2.2 2.2  --   --   PHOS 4.8* 5.9* 7.2* 4.0 5.4*  --   --     Liver Function Tests: Recent Labs  Lab 10/29/18 0327 10/30/18 0247 10/31/18 0319  ALBUMIN 2.0* 1.9* 1.9*   No results for input(s): LIPASE, AMYLASE in the last 168 hours. No results for input(s): AMMONIA in the last 168 hours.  CBC: Recent Labs  Lab 10/30/18 0247 10/30/18 1320 10/31/18 0319 11/01/18 0600 11/02/18 1150  WBC 10.4 10.8* 11.0* 11.1* 11.9*  HGB 6.3* 7.1* 7.0* 8.5* 8.8*  HCT 22.4* 25.4* 24.7* 29.3* 30.0*  MCV 90.0 90.1 89.8 88.8 87.5  PLT 212 220 245 200 234    Cardiac Enzymes: No results for input(s): CKTOTAL, CKMB, CKMBINDEX, TROPONINI in the last 168 hours.  BNP: Invalid input(s): POCBNP  CBG: Recent Labs  Lab 10/31/18 1953 11/01/18 0030 11/01/18 0425 11/01/18 0803 11/01/18 1138  GLUCAP 109* 125* 144* 112* 130*    Microbiology: Results for orders placed or performed during the hospital encounter of 10/07/18  Culture, blood (routine x 2)     Status: None   Collection Time: 10/07/18  9:55 PM  Result Value Ref Range Status   Specimen Description BLOOD RIGHT HAND  Final  Special Requests   Final    BOTTLES DRAWN AEROBIC AND ANAEROBIC Blood Culture adequate volume   Culture   Final    NO GROWTH 5 DAYS Performed at [facility], [place] [address] [place], [place] [address]    Report Status 10/12/2018 FINAL  Final  Culture, blood (routine x 2)     Status: None   Collection Time: 10/08/18 12:11 AM  Result Value Ref Range Status   Specimen Description BLOOD CENTRAL LINE  Final   Special Requests   Final    BOTTLES DRAWN AEROBIC AND ANAEROBIC Blood Culture adequate volume   Culture   Final    NO GROWTH 5 DAYS Performed at [place] [address],  [place], [place] [address]    Report Status 10/13/2018 FINAL  Final  MRSA PCR Screening     Status: None   Collection Time: 10/08/18  3:26 AM  Result Value Ref Range Status   MRSA by PCR NEGATIVE NEGATIVE Final    Comment:        The GeneXpert MRSA Assay (FDA approved for NASAL specimens only), is one component of a comprehensive MRSA colonization surveillance program. It is not intended to diagnose MRSA infection nor to guide or monitor treatment for MRSA infections. Performed at [facility], [place] [address], [place], [place] [address]   Culture, respiratory (non-expectorated)     Status: None   Collection Time: 10/08/18  8:59 AM  Result Value Ref Range Status   Specimen Description TRACHEAL ASPIRATE  Final   Special Requests NONE  Final   Gram Stain   Final    RARE WBC PRESENT, PREDOMINANTLY PMN RARE GRAM POSITIVE COCCI RARE GRAM POSITIVE RODS    Culture   Final    Consistent with normal respiratory flora. Performed at [facility], [place] [address], [place], [place] [address]    Report Status 10/10/2018 FINAL  Final  Expectorated sputum assessment w rflx to resp cult     Status: None   Collection Time: 10/12/18  8:38 PM  Result Value Ref Range Status   Specimen Description EXPECTORATED SPUTUM  Final   Special Requests   Final    NONE Performed at [facility], [place] [address] [place], [place] [address]    Sputum evaluation THIS SPECIMEN IS ACCEPTABLE FOR SPUTUM CULTURE  Final   Report Status 10/12/2018 FINAL  Final  Culture, respiratory     Status: None   Collection Time: 10/12/18  8:38 PM  Result Value Ref Range Status   Specimen Description EXPECTORATED SPUTUM  Final   Special Requests NONE Reflexed from [id number]  Final   Gram Stain   Final    FEW WBC PRESENT, PREDOMINANTLY PMN RARE GRAM POSITIVE COCCI    Culture   Final    Consistent with normal respiratory flora. Performed at [facility], [place] [address], [place], [place] [address]     Report Status 10/15/2018 FINAL  Final  Culture, Urine     Status: Abnormal   Collection Time: 10/15/18 11:02 AM  Result Value Ref Range Status   Specimen Description URINE, CATHETERIZED  Final   Special Requests   Final    NONE Performed at [facility], [place] [address], [place], [place] [address]    Culture >=100,000 COLONIES/mL SERRATIA MARCESCENS (A)  Final   Report Status 10/17/2018 FINAL  Final   Organism ID, Bacteria SERRATIA MARCESCENS (A)  Final  Susceptibility   Serratia marcescens - MIC*    CEFAZOLIN >=64 RESISTANT Resistant     CEFTRIAXONE <=1 SENSITIVE Sensitive     CIPROFLOXACIN <=0.25 SENSITIVE Sensitive     GENTAMICIN <=1 SENSITIVE Sensitive     NITROFURANTOIN 128 RESISTANT Resistant     TRIMETH/SULFA <=20 SENSITIVE Sensitive     * >=100,000 COLONIES/mL SERRATIA MARCESCENS    Coagulation Studies: No results for input(s): LABPROT, INR in the last 72 hours.  Urinalysis: No results for input(s): COLORURINE, LABSPEC, PHURINE, GLUCOSEU, HGBUR, BILIRUBINUR, KETONESUR, PROTEINUR, UROBILINOGEN, NITRITE, LEUKOCYTESUR in the last 72 hours.  Invalid input(s): APPERANCEUR    Imaging: Dg Chest Port 1 View  Result Date: 11/01/2018 CLINICAL DATA:  Respiratory failure EXAM: PORTABLE CHEST 1 VIEW COMPARISON:  10/23/2018 FINDINGS: Endotracheal tube in good position. Right jugular central venous catheter removed. Interval placement of left jugular sent venous catheter tip at the cavoatrial junction. No pneumothorax Diffuse bilateral airspace disease unchanged, probable edema. IMPRESSION: Left jugular central venous catheter tip in the SVC at the cavoatrial junction. Diffuse bilateral airspace disease consistent with edema. Electronically Signed   By: Franchot Gallo M.D.   On: 11/01/2018 18:33   Dg Abd Portable 1v  Result Date: 11/01/2018 CLINICAL DATA:  Encounter for nasogastric tube placement. EXAM: PORTABLE ABDOMEN - 1 VIEW COMPARISON:  Chest radiograph 10/22/2018  FINDINGS: Single portable view of the abdomen was obtained. Patient is rotated on this examination. Feeding tube extends into the abdomen and appears to be coiled in the stomach. However, the tip of the tube is not visualized. IMPRESSION: Feeding tube extends into the abdomen but the tip is not visualized. Electronically Signed   By: Markus Daft M.D.   On: 11/01/2018 18:33      Assessment & Plan: Pt is a 57 y.o. female with a PMHx of recurrent V. fib/PEA arrest, recent acute respiratory failure, COPD, obstructive sleep apnea, diastolic heart failure, multifactorial anemia, diabetes mellitus type 2, who was admitted to [facility] on 11/01/2018 for ongoing care.    1.  Acute renal failure/chronic kidney disease stage IV.  Patient now dialysis dependent since late October.  Patient seen and evaluated during hemodialysis and tolerating well.  Next dialysis treatment will be on Monday.  2.  Acute respiratory failure.  Patient still maintained on the ventilator via tracheostomy.  Continue current ventilatory support.  3.  Anemia chronic kidney disease.  Start the patient on retacrit 10000 units IV with HD.   4.  Secondary hyperparathyroidism.  Check intact PTH and phosphorus with next dialysis treatment.  5.  Thanks for consultation.

## 2018-11-02 NOTE — Telephone Encounter (Signed)
Attempted to call pt but unable to reach her. Left message for pt to return call. 

## 2018-11-02 NOTE — Consult Note (Signed)
Pulmonary Critical Care Medicine [place]  PULMONARY SERVICE  Date of Service: 11/02/2018  PULMONARY CRITICAL CARE CONSULT   Catherine Williams  ZOX:[id number]  DOB: 01-30-1961   DOA: 11/01/2018  Referring Physician: Merton Border, MD  HPI: Catherine Williams is a 57 y.o. female seen for follow up of Acute on Chronic Respiratory Failure.  Patient is transferred to our facility for further management currently is on T collar.  History is as follows.  Patient has a past medical history significant for acute renal failure chronic kidney disease mucous plugging cardiac arrest presented to the hospital on October 20 after a pulseless electrical activity cardiac arrest.  It appears based on the history that the initial downtime was 30 minutes.  This CPR was started once EMS arrived and required about 20 minutes of CPR with 2 shocks.  Patient was evaluated in the emergency department and admitted to the ICU.  Hospital course was as follows.  Apparently patient developed a mucous plug and had a cardiac arrest.  Patient did undergo cardiac catheterization which was unremarkable.  Patient was also felt to have pneumonia treated with antibiotics.  Also suffered a kidney insult with acute on chronic stage IV kidney disease.  Patient had undergone hemodialysis and been seen by nephrology.  Review of Systems:  ROS performed and is unremarkable other than noted above.  Past Medical History:  Diagnosis Date  . Anemia   . Arthritis Dec. 2014   Gout  . Asthma   . CHF (congestive heart failure) ([place])   . COPD (chronic obstructive pulmonary disease) ([place])   . Diabetes mellitus   . GERD (gastroesophageal reflux disease)   . Heart failure   . Hyperlipidemia   . Hypertension   . Kidney disease   . Morbid obesity ([place])   . Peripheral vascular disease ([place])   . Pinched nerve   . Requires supplemental oxygen    as needed  . Sleep apnea    wears cpap  . Wears dentures     Past Surgical  History:  Procedure Laterality Date  . Atherectomy and angioplasty  10/18/2011   left posterior tibial artery  . AV FISTULA PLACEMENT Left 10/04/2018   Procedure: ARTERIOVENOUS (AV) FISTULA CREATION LEFT ARM;  Surgeon: Serafina Mitchell, MD;  Location: [facility];  Service: Vascular;  Laterality: Left;  . COLONOSCOPY    . HAND SURGERY     right  . IR FLUORO GUIDE CV LINE LEFT  10/26/2018  . IR US GUIDE VASC ACCESS LEFT  10/26/2018  . LEFT HEART CATH AND CORONARY ANGIOGRAPHY N/A 10/19/2018   Procedure: LEFT HEART CATH AND CORONARY ANGIOGRAPHY;  Surgeon: Leonie Man, MD;  Location: [facility];  Service: Cardiovascular;  Laterality: N/A;  . MULTIPLE TOOTH EXTRACTIONS    . OVARY SURGERY     Left  . TOE AMPUTATION  Sept. 25,2012   Left 4th and 5th toes    Social History:    reports that she has never smoked. She has never used smokeless tobacco. She reports that she has current or past drug history. Drug: Marijuana. She reports that she does not drink alcohol.  Family History: Non-Contributory to the present illness  Allergies  Allergen Reactions  . Omnipaque [Iohexol] Nausea And Vomiting    Contrast Dye- Effects Kidney's    Medications: Reviewed on Rounds  Physical Exam:  Vitals: Temperature 98.7 pulse 78 respiratory rate 22 blood pressure 121/56 saturations 98%  Ventilator Settings mode of  ventilation SIMV FiO2 28% tidal volume 440 pressure support 12 PEEP 5  . General: Comfortable at this time . Eyes: Grossly normal lids, irises & conjunctiva . ENT: grossly tongue is normal . Neck: no obvious mass . Cardiovascular: S1-S2 normal no gallop or rub . Respiratory: Coarse rhonchi expansion is equal . Abdomen: Soft and nontender . Skin: no rash seen on limited exam . Musculoskeletal: not rigid . Psychiatric:unable to assess . Neurologic: no seizure no involuntary movements         Labs on Admission:  Basic Metabolic Panel: Recent Labs  Lab 10/27/18 0402  10/28/18 0632 10/29/18 0327 10/30/18 0247 10/31/18 0319 11/01/18 0600 11/02/18 1150  NA 135 136 138 135 137 133* 134*  K 4.0 4.3 4.5 3.7 4.3 3.8 4.0  CL 99 100 99 96* 97* 93* 93*  CO2 24 21* 25 27 27 26 25   GLUCOSE 129* 122* 101* 129* 119* 130* 134*  BUN 40* 64* 81* 49* 70* 54* 80*  CREATININE 4.07* 5.46* 6.62* 4.26* 5.48* 4.40* 6.07*  CALCIUM 9.0 9.6 9.9 9.2 9.7 9.9 10.0  MG 2.4 2.3 2.4 2.2 2.2  --   --   PHOS 4.8* 5.9* 7.2* 4.0 5.4*  --   --     No results for input(s): PHART, PCO2ART, PO2ART, HCO3, O2SAT in the last 168 hours.  Liver Function Tests: Recent Labs  Lab 10/27/18 0402 10/28/18 3536 10/29/18 0327 10/30/18 0247 10/31/18 0319  ALBUMIN 1.9* 2.0* 2.0* 1.9* 1.9*   No results for input(s): LIPASE, AMYLASE in the last 168 hours. No results for input(s): AMMONIA in the last 168 hours.  CBC: Recent Labs  Lab 10/30/18 0247 10/30/18 1320 10/31/18 0319 11/01/18 0600 11/02/18 1150  WBC 10.4 10.8* 11.0* 11.1* 11.9*  HGB 6.3* 7.1* 7.0* 8.5* 8.8*  HCT 22.4* 25.4* 24.7* 29.3* 30.0*  MCV 90.0 90.1 89.8 88.8 87.5  PLT 212 220 245 200 234    Cardiac Enzymes: No results for input(s): CKTOTAL, CKMB, CKMBINDEX, TROPONINI in the last 168 hours.  BNP (last 3 results) Recent Labs    05/13/18 0333  BNP 78.9    ProBNP (last 3 results) No results for input(s): PROBNP in the last 8760 hours.   Radiological Exams on Admission: Ct Pelvis Wo Contrast  Result Date: 10/31/2018 CLINICAL DATA:  Postmenopausal bleeding. EXAM: CT PELVIS WITHOUT CONTRAST TECHNIQUE: Multidetector CT imaging of the pelvis was performed following the standard protocol without intravenous contrast. COMPARISON:  None. FINDINGS: Urinary Tract: No hydroureteronephrosis. The urinary bladder is unremarkable for the degree of distention. Bowel: Enteric contrast reaches the rectum. No bowel obstruction is seen. Small and large bowel loops are seen within the patient's pannus pannus. Vascular/Lymphatic:  Nonaneurysmal distal abdominal aorta with atherosclerosis of the common iliac arteries and branch vessels. Reproductive: Fibroid appearing uterus with intramural scattered coarse calcifications likely representing areas of fibroid degeneration. No adnexal mass. Other: Mild soft tissue anasarca. Layering gallstones of the included gallbladder without wall thickening or pericholecystic fluid. Musculoskeletal: No suspicious bone lesions identified. IMPRESSION: 1. No acute bowel obstruction or inflammation. 2. Fibroid appearing uterus. 3. Mild soft tissue anasarca. 4. Cholelithiasis without acute cholecystitis. Electronically Signed   By: Ashley Royalty M.D.   On: 10/31/2018 00:02   Dg Chest Port 1 View  Result Date: 11/01/2018 CLINICAL DATA:  Respiratory failure EXAM: PORTABLE CHEST 1 VIEW COMPARISON:  10/23/2018 FINDINGS: Endotracheal tube in good position. Right jugular central venous catheter removed. Interval placement of left jugular sent venous catheter tip at the  cavoatrial junction. No pneumothorax Diffuse bilateral airspace disease unchanged, probable edema. IMPRESSION: Left jugular central venous catheter tip in the SVC at the cavoatrial junction. Diffuse bilateral airspace disease consistent with edema. Electronically Signed   By: Franchot Gallo M.D.   On: 11/01/2018 18:33   Dg Abd Portable 1v  Result Date: 11/01/2018 CLINICAL DATA:  Encounter for nasogastric tube placement. EXAM: PORTABLE ABDOMEN - 1 VIEW COMPARISON:  Chest radiograph 10/22/2018 FINDINGS: Single portable view of the abdomen was obtained. Patient is rotated on this examination. Feeding tube extends into the abdomen and appears to be coiled in the stomach. However, the tip of the tube is not visualized. IMPRESSION: Feeding tube extends into the abdomen but the tip is not visualized. Electronically Signed   By: Markus Daft M.D.   On: 11/01/2018 18:33    Assessment/Plan Active Problems:   Acute on chronic respiratory failure with  hypoxia (HCC)   Cardiac arrest (HCC)   Increased tracheal secretions   End stage renal disease on dialysis (HCC)   Chronic diastolic heart failure of unknown etiology ([place])   Healthcare-associated pneumonia   1. Acute on chronic respiratory failure with hypoxia patient's been started on wean today we will switch over to pressure support mode.  Titrate down as tolerated continue pulmonary toilet secretion management. 2. Cardiac arrest we need to continue to monitor her rhythm currently rhythm is stable we will continue with supportive care. 3. Recurrent mucous plugging aggressive pulmonary toilet will be continued. 4. Chronic diastolic heart failure we will continue to monitor fluid status patient will be diuresed as possible. 5. Acute on chronic stage IV kidney disease seen by nephrology has been dependent on dialysis.  Dialysis will be started. 6. Healthcare associated pneumonia follow-up on x-rays patient was treated last chest x-ray shows diffuse bilateral airspace disease with consistency of low pulmonary edema.  Hopefully this will improve with the dialysis.  I have personally seen and evaluated the patient, evaluated laboratory and imaging results, formulated the assessment and plan and placed orders. The Patient requires high complexity decision making for assessment and support.  Case was discussed on Rounds with the Respiratory Therapy Staff Time Spent 58minutes  Allyne Gee, MD [facility] Pulmonary Critical Care Medicine Sleep Medicine

## 2018-11-03 ENCOUNTER — Encounter: Payer: Self-pay | Admitting: Internal Medicine

## 2018-11-03 DIAGNOSIS — I469 Cardiac arrest, cause unspecified: Secondary | ICD-10-CM | POA: Diagnosis present

## 2018-11-03 DIAGNOSIS — J398 Other specified diseases of upper respiratory tract: Secondary | ICD-10-CM | POA: Diagnosis present

## 2018-11-03 DIAGNOSIS — I5032 Chronic diastolic (congestive) heart failure: Secondary | ICD-10-CM | POA: Diagnosis present

## 2018-11-03 DIAGNOSIS — J189 Pneumonia, unspecified organism: Secondary | ICD-10-CM | POA: Diagnosis present

## 2018-11-03 DIAGNOSIS — N186 End stage renal disease: Secondary | ICD-10-CM

## 2018-11-03 DIAGNOSIS — J9621 Acute and chronic respiratory failure with hypoxia: Secondary | ICD-10-CM | POA: Diagnosis present

## 2018-11-03 DIAGNOSIS — Z992 Dependence on renal dialysis: Secondary | ICD-10-CM

## 2018-11-03 LAB — HEPATITIS B CORE ANTIBODY, IGM: HEP B C IGM: NEGATIVE

## 2018-11-03 LAB — HEPATITIS B SURFACE ANTIGEN: Hepatitis B Surface Ag: NEGATIVE

## 2018-11-03 LAB — HEPATITIS B SURFACE ANTIBODY, QUANTITATIVE: Hepatitis B-Post: <3.1 m[IU]/mL — ABNORMAL LOW (ref >9.9)

## 2018-11-03 NOTE — Progress Notes (Addendum)
Pulmonary Critical Care Medicine [place]   PULMONARY CRITICAL CARE SERVICE  PROGRESS NOTE  Date of Service: 11/03/2018  Catherine Williams  [id number]  DOB: 1961/08/25   DOA: 11/01/2018  Referring Physician: Merton Border, MD  HPI: Catherine Williams is a 57 y.o. female seen for follow up of Acute on Chronic Respiratory Failure.  Patient remains on the wean right now is on T collar.  Apparently tried to get out of bed and was on her knees.  For now is back in bed seems to be comfortable.  Patient did not actually take a fall or have any injuries  Medications: Reviewed on Rounds  Physical Exam:  Vitals: Temperature 98.7 pulse 72 respiratory rate 20 blood pressure 158/68 saturations 99%  Ventilator Settings currently is on T collar FiO2 28%  . General: Comfortable at this time . Eyes: Grossly normal lids, irises & conjunctiva . ENT: grossly tongue is normal . Neck: no obvious mass . Cardiovascular: S1 S2 normal no gallop . Respiratory: Coarse breath sounds with few rhonchi . Abdomen: soft . Skin: no rash seen on limited exam . Musculoskeletal: not rigid . Psychiatric:unable to assess . Neurologic: no seizure no involuntary movements         Lab Data:   Basic Metabolic Panel: Recent Labs  Lab 10/28/18 0632 10/29/18 0327 10/30/18 0247 10/31/18 0319 11/01/18 0600 11/02/18 1150  NA 136 138 135 137 133* 134*  K 4.3 4.5 3.7 4.3 3.8 4.0  CL 100 99 96* 97* 93* 93*  CO2 21* 25 27 27 26 25   GLUCOSE 122* 101* 129* 119* 130* 134*  BUN 64* 81* 49* 70* 54* 80*  CREATININE 5.46* 6.62* 4.26* 5.48* 4.40* 6.07*  CALCIUM 9.6 9.9 9.2 9.7 9.9 10.0  MG 2.3 2.4 2.2 2.2  --   --   PHOS 5.9* 7.2* 4.0 5.4*  --   --     ABG: No results for input(s): PHART, PCO2ART, PO2ART, HCO3, O2SAT in the last 168 hours.  Liver Function Tests: Recent Labs  Lab 10/28/18 5188 10/29/18 0327 10/30/18 0247 10/31/18 0319  ALBUMIN 2.0* 2.0* 1.9* 1.9*   No results for input(s):  LIPASE, AMYLASE in the last 168 hours. No results for input(s): AMMONIA in the last 168 hours.  CBC: Recent Labs  Lab 10/30/18 0247 10/30/18 1320 10/31/18 0319 11/01/18 0600 11/02/18 1150  WBC 10.4 10.8* 11.0* 11.1* 11.9*  HGB 6.3* 7.1* 7.0* 8.5* 8.8*  HCT 22.4* 25.4* 24.7* 29.3* 30.0*  MCV 90.0 90.1 89.8 88.8 87.5  PLT 212 220 245 200 234    Cardiac Enzymes: No results for input(s): CKTOTAL, CKMB, CKMBINDEX, TROPONINI in the last 168 hours.  BNP (last 3 results) Recent Labs    05/13/18 0333  BNP 78.9    ProBNP (last 3 results) No results for input(s): PROBNP in the last 8760 hours.  Radiological Exams: Dg Chest Port 1 View  Result Date: 11/01/2018 CLINICAL DATA:  Respiratory failure EXAM: PORTABLE CHEST 1 VIEW COMPARISON:  10/23/2018 FINDINGS: Endotracheal tube in good position. Right jugular central venous catheter removed. Interval placement of left jugular sent venous catheter tip at the cavoatrial junction. No pneumothorax Diffuse bilateral airspace disease unchanged, probable edema. IMPRESSION: Left jugular central venous catheter tip in the SVC at the cavoatrial junction. Diffuse bilateral airspace disease consistent with edema. Electronically Signed   By: Franchot Gallo M.D.   On: 11/01/2018 18:33   Dg Abd Portable 1v  Result Date: 11/01/2018 CLINICAL DATA:  Encounter  for nasogastric tube placement. EXAM: PORTABLE ABDOMEN - 1 VIEW COMPARISON:  Chest radiograph 10/22/2018 FINDINGS: Single portable view of the abdomen was obtained. Patient is rotated on this examination. Feeding tube extends into the abdomen and appears to be coiled in the stomach. However, the tip of the tube is not visualized. IMPRESSION: Feeding tube extends into the abdomen but the tip is not visualized. Electronically Signed   By: Markus Daft M.D.   On: 11/01/2018 18:33    Assessment/Plan Active Problems:   Acute on chronic respiratory failure with hypoxia (HCC)   Cardiac arrest (HCC)    Increased tracheal secretions   End stage renal disease on dialysis (HCC)   Chronic diastolic heart failure of unknown etiology ([place])   Healthcare-associated pneumonia   1. Acute on chronic respiratory failure with hypoxia we will continue with T collar trials continue pulmonary toilet secretion management.  Patient should advance on T collar wean as tolerated 2. Cardiac arrest so far rhythm is been stable we will continue to monitor. 3. Retained tracheal secretions continue aggressive pulmonary toilet supportive care. 4. End-stage renal disease on dialysis seen by nephrology for hemodialysis 5. Chronic diastolic heart failure continue to monitor radiologically diuresis tolerated. 6. Healthcare associated pneumonia has been treated we will continue to monitor radiologically   I have personally seen and evaluated the patient, evaluated laboratory and imaging results, formulated the assessment and plan and placed orders. The Patient requires high complexity decision making for assessment and support.  Time spent 35 minutes review of the radiological studies and discussion with primary care team Case was discussed on Rounds with the Respiratory Therapy Staff  Catherine Gee, MD [facility] Pulmonary Critical Care Medicine Sleep Medicine

## 2018-11-04 ENCOUNTER — Other Ambulatory Visit (HOSPITAL_COMMUNITY): Payer: Self-pay

## 2018-11-04 DIAGNOSIS — R1031 Right lower quadrant pain: Secondary | ICD-10-CM | POA: Diagnosis not present

## 2018-11-04 NOTE — Progress Notes (Signed)
Pulmonary Critical Care Medicine [place]   PULMONARY CRITICAL CARE SERVICE  PROGRESS NOTE  Date of Service: 11/04/2018  Catherine Williams  IOE:[id number]  DOB: 02/07/1961   DOA: 11/01/2018  Referring Physician: Merton Border, MD  HPI: Catherine Williams is a 57 y.o. female seen for follow up of Acute on Chronic Respiratory Failure.  Patient is on pressure support at this time has a goal of 8 hours  Medications: Reviewed on Rounds  Physical Exam:  Vitals: Temperature 98.8 pulse 82 respiratory rate 24 blood pressure 92/46 saturation 99%  Ventilator Settings mode of ventilation pressure support FiO2 28% per support 12 PEEP 5  . General: Comfortable at this time . Eyes: Grossly normal lids, irises & conjunctiva . ENT: grossly tongue is normal . Neck: no obvious mass . Cardiovascular: S1 S2 normal no gallop . Respiratory: No rhonchi or rales are noted . Abdomen: soft . Skin: no rash seen on limited exam . Musculoskeletal: not rigid . Psychiatric:unable to assess . Neurologic: no seizure no involuntary movements         Lab Data:   Basic Metabolic Panel: Recent Labs  Lab 10/29/18 0327 10/30/18 0247 10/31/18 0319 11/01/18 0600 11/02/18 1150  NA 138 135 137 133* 134*  K 4.5 3.7 4.3 3.8 4.0  CL 99 96* 97* 93* 93*  CO2 25 27 27 26 25   GLUCOSE 101* 129* 119* 130* 134*  BUN 81* 49* 70* 54* 80*  CREATININE 6.62* 4.26* 5.48* 4.40* 6.07*  CALCIUM 9.9 9.2 9.7 9.9 10.0  MG 2.4 2.2 2.2  --   --   PHOS 7.2* 4.0 5.4*  --   --     ABG: No results for input(s): PHART, PCO2ART, PO2ART, HCO3, O2SAT in the last 168 hours.  Liver Function Tests: Recent Labs  Lab 10/29/18 0327 10/30/18 0247 10/31/18 0319  ALBUMIN 2.0* 1.9* 1.9*   No results for input(s): LIPASE, AMYLASE in the last 168 hours. No results for input(s): AMMONIA in the last 168 hours.  CBC: Recent Labs  Lab 10/30/18 0247 10/30/18 1320 10/31/18 0319 11/01/18 0600 11/02/18 1150  WBC 10.4  10.8* 11.0* 11.1* 11.9*  HGB 6.3* 7.1* 7.0* 8.5* 8.8*  HCT 22.4* 25.4* 24.7* 29.3* 30.0*  MCV 90.0 90.1 89.8 88.8 87.5  PLT 212 220 245 200 234    Cardiac Enzymes: No results for input(s): CKTOTAL, CKMB, CKMBINDEX, TROPONINI in the last 168 hours.  BNP (last 3 results) Recent Labs    05/13/18 0333  BNP 78.9    ProBNP (last 3 results) No results for input(s): PROBNP in the last 8760 hours.  Radiological Exams: No results found.  Assessment/Plan Active Problems:   Acute on chronic respiratory failure with hypoxia (HCC)   Cardiac arrest (HCC)   Increased tracheal secretions   End stage renal disease on dialysis (HCC)   Chronic diastolic heart failure of unknown etiology ([place])   Healthcare-associated pneumonia   1. Acute on chronic respiratory failure with hypoxia continue with the wean on pressure support as mentioned above the goal is for 8 hours we will continue to advance. 2. Cardiac arrest rhythm is stable at this time 3. End-stage renal disease on dialysis followed by nephrology 4. Retained tracheal secretions continue aggressive pulmonary toilet 5. Chronic diastolic heart failure stable continue to monitor 6. Healthcare associated pneumonia treated we will continue to monitor   I have personally seen and evaluated the patient, evaluated laboratory and imaging results, formulated the assessment and plan and placed  orders. The Patient requires high complexity decision making for assessment and support.  Case was discussed on Rounds with the Respiratory Therapy Staff  Allyne Gee, MD [facility] Pulmonary Critical Care Medicine Sleep Medicine

## 2018-11-05 ENCOUNTER — Other Ambulatory Visit (HOSPITAL_COMMUNITY): Payer: Self-pay

## 2018-11-05 DIAGNOSIS — J189 Pneumonia, unspecified organism: Secondary | ICD-10-CM | POA: Diagnosis not present

## 2018-11-05 LAB — RENAL FUNCTION PANEL
ALBUMIN: 1.9 g/dL — AB (ref 3.5–5.0)
Anion gap: 13 (ref 5–15)
BUN: 74 mg/dL — AB (ref 6–20)
CO2: 25 mmol/L (ref 22–32)
CREATININE: 7 mg/dL — AB (ref 0.44–1.00)
Calcium: 9.6 mg/dL (ref 8.9–10.3)
Chloride: 94 mmol/L — ABNORMAL LOW (ref 98–111)
GFR, EST AFRICAN AMERICAN: 7 mL/min — AB (ref >60)
GFR, EST NON AFRICAN AMERICAN: 6 mL/min — AB (ref >60)
Glucose, Bld: 136 mg/dL — ABNORMAL HIGH (ref 70–99)
PHOSPHORUS: 5.4 mg/dL — AB (ref 2.5–4.6)
POTASSIUM: 4.2 mmol/L (ref 3.5–5.1)
Sodium: 132 mmol/L — ABNORMAL LOW (ref 135–145)

## 2018-11-05 LAB — CBC
HEMATOCRIT: 26.4 % — AB (ref 36.0–46.0)
Hemoglobin: 7.6 g/dL — ABNORMAL LOW (ref 12.0–15.0)
MCH: 25.2 pg — ABNORMAL LOW (ref 26.0–34.0)
MCHC: 28.8 g/dL — ABNORMAL LOW (ref 30.0–36.0)
MCV: 87.7 fL (ref 80.0–100.0)
NRBC: 0 % (ref 0.0–0.2)
Platelets: 284 10*3/uL (ref 150–400)
RBC: 3.01 MIL/uL — ABNORMAL LOW (ref 3.87–5.11)
RDW: 15.7 % — AB (ref 11.5–15.5)
WBC: 26 10*3/uL — AB (ref 4.0–10.5)

## 2018-11-05 NOTE — Progress Notes (Signed)
[facility]  ROUNDING NOTE   Subjective:  Patient seen at bedside. Tracheostomy in place. Patient due for hemodialysis today.    Objective:  Vital signs in last 24 hours:  Temperature 98.9 pulse 81 respirations 31 blood pressure 100/66 Physical Exam: General: Critically ill appearing  Head: Normocephalic, atraumatic. Moist oral mucosal membranes  Eyes: Anicteric  Neck: Tracheostomy in place  Lungs:  Scattered rhonchi, normal effort  Heart: S1S2 no rubs  Abdomen:  Soft, nontender, bowel sounds present  Extremities: Trace peripheral edema.  Neurologic: Awake, alert, following commands  Skin: No lesions  Access: LUE AVF    Basic Metabolic Panel: Recent Labs  Lab 10/30/18 0247 10/31/18 0319 11/01/18 0600 11/02/18 1150 11/05/18 0647  NA 135 137 133* 134* 132*  K 3.7 4.3 3.8 4.0 4.2  CL 96* 97* 93* 93* 94*  CO2 27 27 26 25 25   GLUCOSE 129* 119* 130* 134* 136*  BUN 49* 70* 54* 80* 74*  CREATININE 4.26* 5.48* 4.40* 6.07* 7.00*  CALCIUM 9.2 9.7 9.9 10.0 9.6  MG 2.2 2.2  --   --   --   PHOS 4.0 5.4*  --   --  5.4*    Liver Function Tests: Recent Labs  Lab 10/30/18 0247 10/31/18 0319 11/05/18 0647  ALBUMIN 1.9* 1.9* 1.9*   No results for input(s): LIPASE, AMYLASE in the last 168 hours. No results for input(s): AMMONIA in the last 168 hours.  CBC: Recent Labs  Lab 10/30/18 1320 10/31/18 0319 11/01/18 0600 11/02/18 1150 11/05/18 0647  WBC 10.8* 11.0* 11.1* 11.9* 26.0*  HGB 7.1* 7.0* 8.5* 8.8* 7.6*  HCT 25.4* 24.7* 29.3* 30.0* 26.4*  MCV 90.1 89.8 88.8 87.5 87.7  PLT 220 245 200 234 284    Cardiac Enzymes: No results for input(s): CKTOTAL, CKMB, CKMBINDEX, TROPONINI in the last 168 hours.  BNP: Invalid input(s): POCBNP  CBG: Recent Labs  Lab 10/31/18 1953 11/01/18 0030 11/01/18 0425 11/01/18 0803 11/01/18 1138  GLUCAP 109* 125* 144* 112* 130*    Microbiology: Results for orders placed or performed during the hospital encounter  of 10/07/18  Culture, blood (routine x 2)     Status: None   Collection Time: 10/07/18  9:55 PM  Result Value Ref Range Status   Specimen Description BLOOD RIGHT HAND  Final   Special Requests   Final    BOTTLES DRAWN AEROBIC AND ANAEROBIC Blood Culture adequate volume   Culture   Final    NO GROWTH 5 DAYS Performed at [facility], [place] [address], [place], [place] [address]    Report Status 10/12/2018 FINAL  Final  Culture, blood (routine x 2)     Status: None   Collection Time: 10/08/18 12:11 AM  Result Value Ref Range Status   Specimen Description BLOOD CENTRAL LINE  Final   Special Requests   Final    BOTTLES DRAWN AEROBIC AND ANAEROBIC Blood Culture adequate volume   Culture   Final    NO GROWTH 5 DAYS Performed at [place] [address] [place], [place] [address]    Report Status 10/13/2018 FINAL  Final  MRSA PCR Screening     Status: None   Collection Time: 10/08/18  3:26 AM  Result Value Ref Range Status   MRSA by PCR NEGATIVE NEGATIVE Final    Comment:        The GeneXpert MRSA Assay (FDA approved for NASAL specimens only), is one component of a comprehensive MRSA colonization  surveillance program. It is not intended to diagnose MRSA infection nor to guide or monitor treatment for MRSA infections. Performed at [facility], [place] [address], [place], [place] [address]   Culture, respiratory (non-expectorated)     Status: None   Collection Time: 10/08/18  8:59 AM  Result Value Ref Range Status   Specimen Description TRACHEAL ASPIRATE  Final   Special Requests NONE  Final   Gram Stain   Final    RARE WBC PRESENT, PREDOMINANTLY PMN RARE GRAM POSITIVE COCCI RARE GRAM POSITIVE RODS    Culture   Final    Consistent with normal respiratory flora. Performed at [facility], [place] [address] [place], [place] [address]    Report Status 10/10/2018 FINAL  Final  Expectorated sputum assessment w rflx to resp cult     Status: None    Collection Time: 10/12/18  8:38 PM  Result Value Ref Range Status   Specimen Description EXPECTORATED SPUTUM  Final   Special Requests   Final    NONE Performed at [facility], [place] [address], [place], [place] [address]    Sputum evaluation THIS SPECIMEN IS ACCEPTABLE FOR SPUTUM CULTURE  Final   Report Status 10/12/2018 FINAL  Final  Culture, respiratory     Status: None   Collection Time: 10/12/18  8:38 PM  Result Value Ref Range Status   Specimen Description EXPECTORATED SPUTUM  Final   Special Requests NONE Reflexed from [id number]  Final   Gram Stain   Final    FEW WBC PRESENT, PREDOMINANTLY PMN RARE GRAM POSITIVE COCCI    Culture   Final    Consistent with normal respiratory flora. Performed at [facility], [place] [address] [place], [place] [address]    Report Status 10/15/2018 FINAL  Final  Culture, Urine     Status: Abnormal   Collection Time: 10/15/18 11:02 AM  Result Value Ref Range Status   Specimen Description URINE, CATHETERIZED  Final   Special Requests   Final    NONE Performed at [facility], [place] [address], [place], [place] [address]    Culture >=100,000 COLONIES/mL SERRATIA MARCESCENS (A)  Final   Report Status 10/17/2018 FINAL  Final   Organism ID, Bacteria SERRATIA MARCESCENS (A)  Final      Susceptibility   Serratia marcescens - MIC*    CEFAZOLIN >=64 RESISTANT Resistant     CEFTRIAXONE <=1 SENSITIVE Sensitive     CIPROFLOXACIN <=0.25 SENSITIVE Sensitive     GENTAMICIN <=1 SENSITIVE Sensitive     NITROFURANTOIN 128 RESISTANT Resistant     TRIMETH/SULFA <=20 SENSITIVE Sensitive     * >=100,000 COLONIES/mL SERRATIA MARCESCENS    Coagulation Studies: No results for input(s): LABPROT, INR in the last 72 hours.  Urinalysis: No results for input(s): COLORURINE, LABSPEC, PHURINE, GLUCOSEU, HGBUR, BILIRUBINUR, KETONESUR, PROTEINUR, UROBILINOGEN, NITRITE, LEUKOCYTESUR in the last 72 hours.  Invalid input(s): APPERANCEUR    Imaging: Dg  Abd 1 View  Result Date: 11/04/2018 CLINICAL DATA:  Right lower quadrant abdominal pain EXAM: ABDOMEN - 1 VIEW COMPARISON:  11/01/2018 FINDINGS: Feeding tube appears to loop within the stomach. The left upper quadrant is not visualized and the tip of the feeding tube is not visualized. Nonobstructive bowel gas pattern. Moderate stool burden throughout the colon. Study limited by body habitus. No visible free air. IMPRESSION: Moderate stool burden. No evidence of bowel obstruction or free air. Electronically Signed   By: Rolm Baptise  M.D.   On: 11/04/2018 18:34     Medications:       Assessment/ Plan:  57 y.o. female with a PMHx of recurrent V. fib/PEA arrest, recent acute respiratory failure, COPD, obstructive sleep apnea, diastolic heart failure, multifactorial anemia, diabetes mellitus type 2, who was admitted to [facility] on 11/01/2018 for ongoing care.    1.  Acute renal failure/chronic kidney disease stage IV.  Patient now dialysis dependent since late October.   -Creatinine remains high at the moment at 6.1.  Therefore we will continue the patient on hemodialysis.  She is due for dialysis treatment today.  Orders have been prepared.  2.  Acute respiratory failure.  Continue current ventilator support.  Weaning as per pulmonary/critical care.  3.  Anemia chronic kidney disease.     Hemoglobin down to 7.6.  Consider transfusion for hemoglobin of 7 or less.  Otherwise continue erythropoietin.  4.  Secondary hyperparathyroidism.  Phosphorus at target of 5.4.  Continue to monitor bone mineral metabolism parameters.   LOS: 0 Didi Ganaway 11/18/20197:59 AM

## 2018-11-05 NOTE — Telephone Encounter (Signed)
Attempted to call pt but line went straight to VM. Left message for pt to return call. Due to multiple attempts trying to reach pt, per triage protocol message will be closed.

## 2018-11-05 NOTE — Progress Notes (Signed)
Pulmonary Critical Care Medicine [place]   PULMONARY CRITICAL CARE SERVICE  PROGRESS NOTE  Date of Service: 11/05/2018  Catherine Williams  GYJ:[id number]  DOB: May 28, 1961   DOA: 11/01/2018  Referring Physician: Merton Border, MD  HPI: Catherine Williams is a 57 y.o. female seen for follow up of Acute on Chronic Respiratory Failure.  Patient is on assist control mode currently has been requiring 28% oxygen with a PEEP of 5  Medications: Reviewed on Rounds  Physical Exam:  Vitals: Temperature 98.9 pulse 87 respiratory rate 31 blood pressure 100/66 saturations 98%  Ventilator Settings mode of ventilation assist control FiO2 28% tidal volume 400 PEEP 5  . General: Comfortable at this time . Eyes: Grossly normal lids, irises & conjunctiva . ENT: grossly tongue is normal . Neck: no obvious mass . Cardiovascular: S1 S2 normal no gallop . Respiratory: Coarse breath sounds with a few scattered rhonchi . Abdomen: soft . Skin: no rash seen on limited exam . Musculoskeletal: not rigid . Psychiatric:unable to assess . Neurologic: no seizure no involuntary movements         Lab Data:   Basic Metabolic Panel: Recent Labs  Lab 10/30/18 0247 10/31/18 0319 11/01/18 0600 11/02/18 1150 11/05/18 0647  NA 135 137 133* 134* 132*  K 3.7 4.3 3.8 4.0 4.2  CL 96* 97* 93* 93* 94*  CO2 27 27 26 25 25   GLUCOSE 129* 119* 130* 134* 136*  BUN 49* 70* 54* 80* 74*  CREATININE 4.26* 5.48* 4.40* 6.07* 7.00*  CALCIUM 9.2 9.7 9.9 10.0 9.6  MG 2.2 2.2  --   --   --   PHOS 4.0 5.4*  --   --  5.4*    ABG: No results for input(s): PHART, PCO2ART, PO2ART, HCO3, O2SAT in the last 168 hours.  Liver Function Tests: Recent Labs  Lab 10/30/18 0247 10/31/18 0319 11/05/18 0647  ALBUMIN 1.9* 1.9* 1.9*   No results for input(s): LIPASE, AMYLASE in the last 168 hours. No results for input(s): AMMONIA in the last 168 hours.  CBC: Recent Labs  Lab 10/30/18 1320 10/31/18 0319  11/01/18 0600 11/02/18 1150 11/05/18 0647  WBC 10.8* 11.0* 11.1* 11.9* 26.0*  HGB 7.1* 7.0* 8.5* 8.8* 7.6*  HCT 25.4* 24.7* 29.3* 30.0* 26.4*  MCV 90.1 89.8 88.8 87.5 87.7  PLT 220 245 200 234 284    Cardiac Enzymes: No results for input(s): CKTOTAL, CKMB, CKMBINDEX, TROPONINI in the last 168 hours.  BNP (last 3 results) Recent Labs    05/13/18 0333  BNP 78.9    ProBNP (last 3 results) No results for input(s): PROBNP in the last 8760 hours.  Radiological Exams: Dg Abd 1 View  Result Date: 11/04/2018 CLINICAL DATA:  Right lower quadrant abdominal pain EXAM: ABDOMEN - 1 VIEW COMPARISON:  11/01/2018 FINDINGS: Feeding tube appears to loop within the stomach. The left upper quadrant is not visualized and the tip of the feeding tube is not visualized. Nonobstructive bowel gas pattern. Moderate stool burden throughout the colon. Study limited by body habitus. No visible free air. IMPRESSION: Moderate stool burden. No evidence of bowel obstruction or free air. Electronically Signed   By: Rolm Baptise M.D.   On: 11/04/2018 18:34    Assessment/Plan Active Problems:   Acute on chronic respiratory failure with hypoxia (HCC)   Cardiac arrest (HCC)   Increased tracheal secretions   End stage renal disease on dialysis (HCC)   Chronic diastolic heart failure of unknown etiology ([place])  Healthcare-associated pneumonia   1. Acute on chronic respiratory failure with hypoxia continue with assist control right now patient is on dialysis after dialysis is completed we will reassess again for weaning. 2. Cardiac arrest rhythm is stable continue to monitor 3. Retained secretions continue aggressive pulmonary toilet 4. End-stage renal disease on dialysis continue to monitor. 5. Chronic diastolic heart failure monitor fluid status 6. Healthcare associated pneumonia treated we will continue to follow along   I have personally seen and evaluated the patient, evaluated laboratory and imaging  results, formulated the assessment and plan and placed orders. The Patient requires high complexity decision making for assessment and support.  Case was discussed on Rounds with the Respiratory Therapy Staff  Allyne Gee, MD [facility] Pulmonary Critical Care Medicine Sleep Medicine

## 2018-11-06 ENCOUNTER — Telehealth: Payer: Self-pay | Admitting: Adult Health

## 2018-11-06 LAB — HEPATITIS B CORE ANTIBODY, IGM: HEP B C IGM: NEGATIVE

## 2018-11-06 LAB — CBC
HCT: 27 % — ABNORMAL LOW (ref 36.0–46.0)
HEMOGLOBIN: 7.7 g/dL — AB (ref 12.0–15.0)
MCH: 25.6 pg — ABNORMAL LOW (ref 26.0–34.0)
MCHC: 28.5 g/dL — AB (ref 30.0–36.0)
MCV: 89.7 fL (ref 80.0–100.0)
NRBC: 0 % (ref 0.0–0.2)
Platelets: 263 10*3/uL (ref 150–400)
RBC: 3.01 MIL/uL — ABNORMAL LOW (ref 3.87–5.11)
RDW: 16 % — ABNORMAL HIGH (ref 11.5–15.5)
WBC: 25.9 10*3/uL — AB (ref 4.0–10.5)

## 2018-11-06 LAB — PTH, INTACT AND CALCIUM
Calcium, Total (PTH): 9.7 mg/dL (ref 8.7–10.2)
PTH: 271 pg/mL — ABNORMAL HIGH (ref 15–65)

## 2018-11-06 LAB — HEPATITIS B SURFACE ANTIGEN: Hepatitis B Surface Ag: NEGATIVE

## 2018-11-06 NOTE — Telephone Encounter (Signed)
Note from  10/30/18 where pt has been attemtped to be contacted.   [place] and spoke with Charmia stating to her the information from [employer] in regards to the FMLA paperwork that pt's daughter was requesting in regards to pt.  Per Charmia, she was not able to locate the pt or her daughter in their system, even by trying pt's last four digits of social security number.  Attempted to call pt but unable to reach her. Left message for pt to return call.     Pt had been attempted to be contacted numerous times and the message had been closed due to not being able to reach her.    Attempted to contact pt's daughter Shara Blazing but unable to reach her. Left message for Akiva to return call.

## 2018-11-06 NOTE — Telephone Encounter (Signed)
Pt daughter returning call concerning FMLA paperwork CB # [phone]

## 2018-11-06 NOTE — Progress Notes (Signed)
Pulmonary Critical Care Medicine [place]   PULMONARY CRITICAL CARE SERVICE  PROGRESS NOTE  Date of Service: 11/06/2018  Catherine Williams  WCB:[id number]  DOB: Dec 28, 1960   DOA: 11/01/2018  Referring Physician: Merton Border, MD  HPI: Catherine Williams is a 57 y.o. female seen for follow up of Acute on Chronic Respiratory Failure.  Patient is currently on pressure support mode has been on 28% oxygen with a pressure support of 12 PEEP 5  Medications: Reviewed on Rounds  Physical Exam:  Vitals: Temperature 98.3 pulse 86 respiratory 25 blood pressure 103/76  Ventilator Settings mode ventilation pressure support FiO2 28% tidal volume 337 per support 12 PEEP 5  . General: Comfortable at this time . Eyes: Grossly normal lids, irises & conjunctiva . ENT: grossly tongue is normal . Neck: no obvious mass . Cardiovascular: S1 S2 normal no gallop . Respiratory: No rhonchi no rales are noted at this time . Abdomen: soft . Skin: no rash seen on limited exam . Musculoskeletal: not rigid . Psychiatric:unable to assess . Neurologic: no seizure no involuntary movements         Lab Data:   Basic Metabolic Panel: Recent Labs  Lab 10/31/18 0319 11/01/18 0600 11/02/18 1150 11/05/18 0647  NA 137 133* 134* 132*  K 4.3 3.8 4.0 4.2  CL 97* 93* 93* 94*  CO2 27 26 25 25   GLUCOSE 119* 130* 134* 136*  BUN 70* 54* 80* 74*  CREATININE 5.48* 4.40* 6.07* 7.00*  CALCIUM 9.7 9.9 10.0 9.6  9.7  MG 2.2  --   --   --   PHOS 5.4*  --   --  5.4*    ABG: No results for input(s): PHART, PCO2ART, PO2ART, HCO3, O2SAT in the last 168 hours.  Liver Function Tests: Recent Labs  Lab 10/31/18 0319 11/05/18 0647  ALBUMIN 1.9* 1.9*   No results for input(s): LIPASE, AMYLASE in the last 168 hours. No results for input(s): AMMONIA in the last 168 hours.  CBC: Recent Labs  Lab 10/31/18 0319 11/01/18 0600 11/02/18 1150 11/05/18 0647 11/06/18 0502  WBC 11.0* 11.1* 11.9* 26.0*  25.9*  HGB 7.0* 8.5* 8.8* 7.6* 7.7*  HCT 24.7* 29.3* 30.0* 26.4* 27.0*  MCV 89.8 88.8 87.5 87.7 89.7  PLT 245 200 234 284 263    Cardiac Enzymes: No results for input(s): CKTOTAL, CKMB, CKMBINDEX, TROPONINI in the last 168 hours.  BNP (last 3 results) Recent Labs    05/13/18 0333  BNP 78.9    ProBNP (last 3 results) No results for input(s): PROBNP in the last 8760 hours.  Radiological Exams: Dg Abd 1 View  Result Date: 11/04/2018 CLINICAL DATA:  Right lower quadrant abdominal pain EXAM: ABDOMEN - 1 VIEW COMPARISON:  11/01/2018 FINDINGS: Feeding tube appears to loop within the stomach. The left upper quadrant is not visualized and the tip of the feeding tube is not visualized. Nonobstructive bowel gas pattern. Moderate stool burden throughout the colon. Study limited by body habitus. No visible free air. IMPRESSION: Moderate stool burden. No evidence of bowel obstruction or free air. Electronically Signed   By: Rolm Baptise M.D.   On: 11/04/2018 18:34   Dg Chest Port 1 View  Result Date: 11/05/2018 CLINICAL DATA:  Pneumonia EXAM: PORTABLE CHEST 1 VIEW COMPARISON:  11/01/2018 FINDINGS: Tracheostomy tube, feeding tube, and left subclavian dialysis catheter remain in place. Low lung volumes are present, causing crowding of the pulmonary vasculature. Interstitial accentuation persists. Indistinct right perihilar airspace opacity. Subsegmental  atelectasis at the left lung base. No blunting of the costophrenic angles. IMPRESSION: 1. Indistinct perihilar airspace opacity on the right, potentially from resolving edema. Interstitial accentuation noted bilaterally. Overall the appearance is mildly improved from 11/01/2018. 2. Low lung volumes. Electronically Signed   By: Van Clines M.D.   On: 11/05/2018 17:06    Assessment/Plan Active Problems:   Acute on chronic respiratory failure with hypoxia (HCC)   Cardiac arrest (HCC)   Increased tracheal secretions   End stage renal disease  on dialysis (HCC)   Chronic diastolic heart failure of unknown etiology ([place])   Healthcare-associated pneumonia   1. Acute on chronic respiratory failure with hypoxia continue with the weaning pressure support mode patient's goal is 8 hours continue to advance as tolerated. 2. Cardiac arrest rhythm is stable continue to monitor 3. Retained tracheal secretions continue aggressive pulmonary toilet. 4. End-stage renal disease on hemodialysis continue to follow. 5. Chronic diastolic heart failure stable at this time. 6. Healthcare associated pneumonia treated chest x-ray looks like resolving interstitial edema we will continue to monitor closely   I have personally seen and evaluated the patient, evaluated laboratory and imaging results, formulated the assessment and plan and placed orders. The Patient requires high complexity decision making for assessment and support.  Case was discussed on Rounds with the Respiratory Therapy Staff  Allyne Gee, MD [facility] Pulmonary Critical Care Medicine Sleep Medicine

## 2018-11-06 NOTE — Telephone Encounter (Signed)
ATC pt, no answer. Left message for pt to call back.  

## 2018-11-07 ENCOUNTER — Other Ambulatory Visit (HOSPITAL_COMMUNITY): Payer: Self-pay

## 2018-11-07 DIAGNOSIS — R140 Abdominal distension (gaseous): Secondary | ICD-10-CM | POA: Diagnosis not present

## 2018-11-07 DIAGNOSIS — Z4682 Encounter for fitting and adjustment of non-vascular catheter: Secondary | ICD-10-CM | POA: Diagnosis not present

## 2018-11-07 NOTE — Telephone Encounter (Signed)
Called patients daughter, unable to reach left message to give us a call back.  

## 2018-11-07 NOTE — Progress Notes (Signed)
[facility]  ROUNDING NOTE   Subjective:  Patient seen and evaluated during hemodialysis. Tolerating quite well. Left IJ PermCath being used.   Objective:  Vital signs in last 24 hours:  99.9 pulse 89 respiration 33 blood pressure 128/72  Physical Exam: General: Critically ill appearing  Head: Normocephalic, atraumatic. Moist oral mucosal membranes  Eyes: Anicteric  Neck: Tracheostomy in place  Lungs:  Scattered rhonchi, normal effort  Heart: S1S2 no rubs  Abdomen:  Soft, nontender, bowel sounds present  Extremities: Trace peripheral edema.  Neurologic: Awake, alert, following commands  Skin: No lesions  Access: LUE AVF, L IJ PC    Basic Metabolic Panel: Recent Labs  Lab 11/01/18 0600 11/02/18 1150 11/05/18 0647  NA 133* 134* 132*  K 3.8 4.0 4.2  CL 93* 93* 94*  CO2 26 25 25   GLUCOSE 130* 134* 136*  BUN 54* 80* 74*  CREATININE 4.40* 6.07* 7.00*  CALCIUM 9.9 10.0 9.6  9.7  PHOS  --   --  5.4*    Liver Function Tests: Recent Labs  Lab 11/05/18 0647  ALBUMIN 1.9*   No results for input(s): LIPASE, AMYLASE in the last 168 hours. No results for input(s): AMMONIA in the last 168 hours.  CBC: Recent Labs  Lab 11/01/18 0600 11/02/18 1150 11/05/18 0647 11/06/18 0502  WBC 11.1* 11.9* 26.0* 25.9*  HGB 8.5* 8.8* 7.6* 7.7*  HCT 29.3* 30.0* 26.4* 27.0*  MCV 88.8 87.5 87.7 89.7  PLT 200 234 284 263    Cardiac Enzymes: No results for input(s): CKTOTAL, CKMB, CKMBINDEX, TROPONINI in the last 168 hours.  BNP: Invalid input(s): POCBNP  CBG: Recent Labs  Lab 10/31/18 1953 11/01/18 0030 11/01/18 0425 11/01/18 0803 11/01/18 1138  GLUCAP 109* 125* 144* 112* 130*    Microbiology: Results for orders placed or performed during the hospital encounter of 10/07/18  Culture, blood (routine x 2)     Status: None   Collection Time: 10/07/18  9:55 PM  Result Value Ref Range Status   Specimen Description BLOOD RIGHT HAND  Final   Special Requests    Final    BOTTLES DRAWN AEROBIC AND ANAEROBIC Blood Culture adequate volume   Culture   Final    NO GROWTH 5 DAYS Performed at [facility], [place] [address], [place], [place] [address]    Report Status 10/12/2018 FINAL  Final  Culture, blood (routine x 2)     Status: None   Collection Time: 10/08/18 12:11 AM  Result Value Ref Range Status   Specimen Description BLOOD CENTRAL LINE  Final   Special Requests   Final    BOTTLES DRAWN AEROBIC AND ANAEROBIC Blood Culture adequate volume   Culture   Final    NO GROWTH 5 DAYS Performed at [facility] [address], [place], [place] [address]    Report Status 10/13/2018 FINAL  Final  MRSA PCR Screening     Status: None   Collection Time: 10/08/18  3:26 AM  Result Value Ref Range Status   MRSA by PCR NEGATIVE NEGATIVE Final    Comment:        The GeneXpert MRSA Assay (FDA approved for NASAL specimens only), is one component of a comprehensive MRSA colonization surveillance program. It is not intended to diagnose MRSA infection nor to guide or monitor treatment for MRSA infections. Performed at [facility], [place] [address], [place], [place] [address]   Culture, respiratory (non-expectorated)     Status:  None   Collection Time: 10/08/18  8:59 AM  Result Value Ref Range Status   Specimen Description TRACHEAL ASPIRATE  Final   Special Requests NONE  Final   Gram Stain   Final    RARE WBC PRESENT, PREDOMINANTLY PMN RARE GRAM POSITIVE COCCI RARE GRAM POSITIVE RODS    Culture   Final    Consistent with normal respiratory flora. Performed at [facility], [place] [address], [place], [place] [address]    Report Status 10/10/2018 FINAL  Final  Expectorated sputum assessment w rflx to resp cult     Status: None   Collection Time: 10/12/18  8:38 PM  Result Value Ref Range Status   Specimen Description EXPECTORATED SPUTUM  Final   Special Requests   Final    NONE Performed at [facility], [place]  [address] [place], [place] [address]    Sputum evaluation THIS SPECIMEN IS ACCEPTABLE FOR SPUTUM CULTURE  Final   Report Status 10/12/2018 FINAL  Final  Culture, respiratory     Status: None   Collection Time: 10/12/18  8:38 PM  Result Value Ref Range Status   Specimen Description EXPECTORATED SPUTUM  Final   Special Requests NONE Reflexed from [id number]  Final   Gram Stain   Final    FEW WBC PRESENT, PREDOMINANTLY PMN RARE GRAM POSITIVE COCCI    Culture   Final    Consistent with normal respiratory flora. Performed at [facility], [place] [address], [place], [place] [address]    Report Status 10/15/2018 FINAL  Final  Culture, Urine     Status: Abnormal   Collection Time: 10/15/18 11:02 AM  Result Value Ref Range Status   Specimen Description URINE, CATHETERIZED  Final   Special Requests   Final    NONE Performed at [facility], [place] [address] [place], [place] [address]    Culture >=100,000 COLONIES/mL SERRATIA MARCESCENS (A)  Final   Report Status 10/17/2018 FINAL  Final   Organism ID, Bacteria SERRATIA MARCESCENS (A)  Final      Susceptibility   Serratia marcescens - MIC*    CEFAZOLIN >=64 RESISTANT Resistant     CEFTRIAXONE <=1 SENSITIVE Sensitive     CIPROFLOXACIN <=0.25 SENSITIVE Sensitive     GENTAMICIN <=1 SENSITIVE Sensitive     NITROFURANTOIN 128 RESISTANT Resistant     TRIMETH/SULFA <=20 SENSITIVE Sensitive     * >=100,000 COLONIES/mL SERRATIA MARCESCENS    Coagulation Studies: No results for input(s): LABPROT, INR in the last 72 hours.  Urinalysis: No results for input(s): COLORURINE, LABSPEC, PHURINE, GLUCOSEU, HGBUR, BILIRUBINUR, KETONESUR, PROTEINUR, UROBILINOGEN, NITRITE, LEUKOCYTESUR in the last 72 hours.  Invalid input(s): APPERANCEUR    Imaging: Dg Chest Port 1 View  Result Date: 11/05/2018 CLINICAL DATA:  Pneumonia EXAM: PORTABLE CHEST 1 VIEW COMPARISON:  11/01/2018 FINDINGS: Tracheostomy tube, feeding tube, and left subclavian dialysis  catheter remain in place. Low lung volumes are present, causing crowding of the pulmonary vasculature. Interstitial accentuation persists. Indistinct right perihilar airspace opacity. Subsegmental atelectasis at the left lung base. No blunting of the costophrenic angles. IMPRESSION: 1. Indistinct perihilar airspace opacity on the right, potentially from resolving edema. Interstitial accentuation noted bilaterally. Overall the appearance is mildly improved from 11/01/2018. 2. Low lung volumes. Electronically Signed   By: Van Clines M.D.   On: 11/05/2018 17:06   Dg Abd Portable 1v  Result Date: 11/07/2018 CLINICAL DATA:  57 y/o  F; feeding tube position. EXAM: PORTABLE  ABDOMEN - 1 VIEW COMPARISON:  11/04/2018 abdomen radiographs. FINDINGS: Enteric tube tip projects over the proximal gastric body. Mild gastric distention. Moderate volume of stool in the colon. Bones are unremarkable. IMPRESSION: Enteric tube tip projects over the proximal gastric body. Electronically Signed   By: Kristine Garbe M.D.   On: 11/07/2018 06:17     Medications:       Assessment/ Plan:  57 y.o. female with a PMHx of recurrent V. fib/PEA arrest, recent acute respiratory failure, COPD, obstructive sleep apnea, diastolic heart failure, multifactorial anemia, diabetes mellitus type 2, who was admitted to [facility] on 11/01/2018 for ongoing care.    1.  Acute renal failure/chronic kidney disease stage IV.  Patient now dialysis dependent since late October.   -Patient seen and evaluated during hemodialysis.  Tolerating well.  Complete dialysis today.  Still using left IJ PermCath.  2.  Acute respiratory failure.  Continue ventilatory support at this time.  3.  Anemia chronic kidney disease.     Hemoglobin currently 7.7.  Maintain the patient on erythropoietin with dialysis.  4.  Secondary hyperparathyroidism.  Recheck serum phosphorus today.   LOS: 0 Tywanna Seifer 11/20/201910:51 AM

## 2018-11-07 NOTE — Telephone Encounter (Signed)
Pt daughter is calling back. States she will not be able to answer the phone after 11:30 CB# [phone]

## 2018-11-07 NOTE — Progress Notes (Signed)
Pulmonary Critical Care Medicine [place]   PULMONARY CRITICAL CARE SERVICE  PROGRESS NOTE  Date of Service: 11/07/2018  Catherine Williams  LAG:[id number]  DOB: December 29, 1960   DOA: 11/01/2018  Referring Physician: Merton Border, MD  HPI: Catherine Williams is a 57 y.o. female seen for follow up of Acute on Chronic Respiratory Failure.  Patient is on assist control mode has been on 28% oxygen was attempted on pressure support today and did not tolerate  Medications: Reviewed on Rounds  Physical Exam:  Vitals: Temperature 99.9 pulse 89 respiratory rate 30 blood pressure 120/72 saturations 97%  Ventilator Settings mode of ventilation assist control FiO2 28% tidal volume 396 PEEP 5  . General: Comfortable at this time . Eyes: Grossly normal lids, irises & conjunctiva . ENT: grossly tongue is normal . Neck: no obvious mass . Cardiovascular: S1 S2 normal no gallop . Respiratory: Coarse breath sounds no rhonchi . Abdomen: soft . Skin: no rash seen on limited exam . Musculoskeletal: not rigid . Psychiatric:unable to assess . Neurologic: no seizure no involuntary movements         Lab Data:   Basic Metabolic Panel: Recent Labs  Lab 11/01/18 0600 11/02/18 1150 11/05/18 0647  NA 133* 134* 132*  K 3.8 4.0 4.2  CL 93* 93* 94*  CO2 26 25 25   GLUCOSE 130* 134* 136*  BUN 54* 80* 74*  CREATININE 4.40* 6.07* 7.00*  CALCIUM 9.9 10.0 9.6  9.7  PHOS  --   --  5.4*    ABG: No results for input(s): PHART, PCO2ART, PO2ART, HCO3, O2SAT in the last 168 hours.  Liver Function Tests: Recent Labs  Lab 11/05/18 0647  ALBUMIN 1.9*   No results for input(s): LIPASE, AMYLASE in the last 168 hours. No results for input(s): AMMONIA in the last 168 hours.  CBC: Recent Labs  Lab 11/01/18 0600 11/02/18 1150 11/05/18 0647 11/06/18 0502  WBC 11.1* 11.9* 26.0* 25.9*  HGB 8.5* 8.8* 7.6* 7.7*  HCT 29.3* 30.0* 26.4* 27.0*  MCV 88.8 87.5 87.7 89.7  PLT 200 234 284 263     Cardiac Enzymes: No results for input(s): CKTOTAL, CKMB, CKMBINDEX, TROPONINI in the last 168 hours.  BNP (last 3 results) Recent Labs    05/13/18 0333  BNP 78.9    ProBNP (last 3 results) No results for input(s): PROBNP in the last 8760 hours.  Radiological Exams: Dg Chest Port 1 View  Result Date: 11/05/2018 CLINICAL DATA:  Pneumonia EXAM: PORTABLE CHEST 1 VIEW COMPARISON:  11/01/2018 FINDINGS: Tracheostomy tube, feeding tube, and left subclavian dialysis catheter remain in place. Low lung volumes are present, causing crowding of the pulmonary vasculature. Interstitial accentuation persists. Indistinct right perihilar airspace opacity. Subsegmental atelectasis at the left lung base. No blunting of the costophrenic angles. IMPRESSION: 1. Indistinct perihilar airspace opacity on the right, potentially from resolving edema. Interstitial accentuation noted bilaterally. Overall the appearance is mildly improved from 11/01/2018. 2. Low lung volumes. Electronically Signed   By: Van Clines M.D.   On: 11/05/2018 17:06   Dg Abd Portable 1v  Result Date: 11/07/2018 CLINICAL DATA:  57 y/o  F; feeding tube position. EXAM: PORTABLE ABDOMEN - 1 VIEW COMPARISON:  11/04/2018 abdomen radiographs. FINDINGS: Enteric tube tip projects over the proximal gastric body. Mild gastric distention. Moderate volume of stool in the colon. Bones are unremarkable. IMPRESSION: Enteric tube tip projects over the proximal gastric body. Electronically Signed   By: Kristine Garbe M.D.   On: 11/07/2018  06:17    Assessment/Plan Active Problems:   Acute on chronic respiratory failure with hypoxia (HCC)   Cardiac arrest (HCC)   Increased tracheal secretions   End stage renal disease on dialysis (HCC)   Chronic diastolic heart failure of unknown etiology ([place])   Healthcare-associated pneumonia   1. Acute on chronic respiratory failure with hypoxia we will continue with full vent support right now  patient was attempted on weaning failed 2. Cardiac arrest rhythm is stable 3. Increased secretions continue aggressive pulmonary toilet 4. End-stage renal disease on dialysis we will follow 5. Chronic diastolic heart failure at baseline 6. Healthcare associated pneumonia treated we will continue to monitor   I have personally seen and evaluated the patient, evaluated laboratory and imaging results, formulated the assessment and plan and placed orders. The Patient requires high complexity decision making for assessment and support.  Case was discussed on Rounds with the Respiratory Therapy Staff  Allyne Gee, MD [facility]monary Critical Care Medicine Sleep Medicine

## 2018-11-07 NOTE — Telephone Encounter (Signed)
Called patients daughter, unable to reach. Left message to give us a call back. 

## 2018-11-07 NOTE — Telephone Encounter (Signed)
Patient's daughter returning call, CB [phone].  States she will be right by her phone until 11:30 am today.

## 2018-11-08 ENCOUNTER — Encounter (HOSPITAL_COMMUNITY): Payer: Self-pay | Admitting: Emergency Medicine

## 2018-11-08 ENCOUNTER — Other Ambulatory Visit: Payer: Self-pay

## 2018-11-08 ENCOUNTER — Inpatient Hospital Stay (HOSPITAL_COMMUNITY)
Admission: EM | Admit: 2018-11-08 | Discharge: 2018-11-09 | DRG: 444 | Disposition: A | Discharge status: Other Acute Inpatient Hospital | Payer: Medicare Other | Source: Other Acute Inpatient Hospital | Attending: Internal Medicine | Admitting: Internal Medicine

## 2018-11-08 ENCOUNTER — Other Ambulatory Visit (HOSPITAL_COMMUNITY): Payer: Self-pay

## 2018-11-08 ENCOUNTER — Inpatient Hospital Stay (HOSPITAL_COMMUNITY): Payer: Medicare Other

## 2018-11-08 DIAGNOSIS — K8000 Calculus of gallbladder with acute cholecystitis without obstruction: Principal | ICD-10-CM | POA: Diagnosis present

## 2018-11-08 DIAGNOSIS — E1122 Type 2 diabetes mellitus with diabetic chronic kidney disease: Secondary | ICD-10-CM | POA: Diagnosis present

## 2018-11-08 DIAGNOSIS — Z91041 Radiographic dye allergy status: Secondary | ICD-10-CM

## 2018-11-08 DIAGNOSIS — Z79891 Long term (current) use of opiate analgesic: Secondary | ICD-10-CM | POA: Diagnosis not present

## 2018-11-08 DIAGNOSIS — Z794 Long term (current) use of insulin: Secondary | ICD-10-CM | POA: Diagnosis not present

## 2018-11-08 DIAGNOSIS — Z792 Long term (current) use of antibiotics: Secondary | ICD-10-CM

## 2018-11-08 DIAGNOSIS — J9690 Respiratory failure, unspecified, unspecified whether with hypoxia or hypercapnia: Secondary | ICD-10-CM | POA: Diagnosis not present

## 2018-11-08 DIAGNOSIS — K810 Acute cholecystitis: Secondary | ICD-10-CM | POA: Diagnosis present

## 2018-11-08 DIAGNOSIS — I959 Hypotension, unspecified: Secondary | ICD-10-CM | POA: Diagnosis present

## 2018-11-08 DIAGNOSIS — M109 Gout, unspecified: Secondary | ICD-10-CM | POA: Diagnosis present

## 2018-11-08 DIAGNOSIS — Z8674 Personal history of sudden cardiac arrest: Secondary | ICD-10-CM | POA: Diagnosis not present

## 2018-11-08 DIAGNOSIS — N186 End stage renal disease: Secondary | ICD-10-CM | POA: Diagnosis present

## 2018-11-08 DIAGNOSIS — IMO0001 Reserved for inherently not codable concepts without codable children: Secondary | ICD-10-CM

## 2018-11-08 DIAGNOSIS — Z992 Dependence on renal dialysis: Secondary | ICD-10-CM | POA: Diagnosis not present

## 2018-11-08 DIAGNOSIS — Z6841 Body Mass Index (BMI) 40.0 and over, adult: Secondary | ICD-10-CM | POA: Diagnosis not present

## 2018-11-08 DIAGNOSIS — Z832 Family history of diseases of the blood and blood-forming organs and certain disorders involving the immune mechanism: Secondary | ICD-10-CM

## 2018-11-08 DIAGNOSIS — Z930 Tracheostomy status: Secondary | ICD-10-CM

## 2018-11-08 DIAGNOSIS — E1151 Type 2 diabetes mellitus with diabetic peripheral angiopathy without gangrene: Secondary | ICD-10-CM | POA: Diagnosis present

## 2018-11-08 DIAGNOSIS — G4733 Obstructive sleep apnea (adult) (pediatric): Secondary | ICD-10-CM | POA: Diagnosis present

## 2018-11-08 DIAGNOSIS — K819 Cholecystitis, unspecified: Secondary | ICD-10-CM | POA: Diagnosis present

## 2018-11-08 DIAGNOSIS — Z79899 Other long term (current) drug therapy: Secondary | ICD-10-CM

## 2018-11-08 DIAGNOSIS — K8020 Calculus of gallbladder without cholecystitis without obstruction: Secondary | ICD-10-CM | POA: Diagnosis not present

## 2018-11-08 DIAGNOSIS — D631 Anemia in chronic kidney disease: Secondary | ICD-10-CM | POA: Diagnosis present

## 2018-11-08 DIAGNOSIS — Z9911 Dependence on respirator [ventilator] status: Secondary | ICD-10-CM

## 2018-11-08 DIAGNOSIS — K219 Gastro-esophageal reflux disease without esophagitis: Secondary | ICD-10-CM | POA: Diagnosis present

## 2018-11-08 DIAGNOSIS — Z89422 Acquired absence of other left toe(s): Secondary | ICD-10-CM

## 2018-11-08 DIAGNOSIS — J449 Chronic obstructive pulmonary disease, unspecified: Secondary | ICD-10-CM | POA: Diagnosis present

## 2018-11-08 DIAGNOSIS — Z801 Family history of malignant neoplasm of trachea, bronchus and lung: Secondary | ICD-10-CM

## 2018-11-08 DIAGNOSIS — Z833 Family history of diabetes mellitus: Secondary | ICD-10-CM

## 2018-11-08 DIAGNOSIS — J9811 Atelectasis: Secondary | ICD-10-CM | POA: Diagnosis present

## 2018-11-08 DIAGNOSIS — I5032 Chronic diastolic (congestive) heart failure: Secondary | ICD-10-CM | POA: Diagnosis present

## 2018-11-08 DIAGNOSIS — E119 Type 2 diabetes mellitus without complications: Secondary | ICD-10-CM

## 2018-11-08 DIAGNOSIS — Z8249 Family history of ischemic heart disease and other diseases of the circulatory system: Secondary | ICD-10-CM

## 2018-11-08 DIAGNOSIS — E785 Hyperlipidemia, unspecified: Secondary | ICD-10-CM | POA: Diagnosis present

## 2018-11-08 DIAGNOSIS — I132 Hypertensive heart and chronic kidney disease with heart failure and with stage 5 chronic kidney disease, or end stage renal disease: Secondary | ICD-10-CM | POA: Diagnosis present

## 2018-11-08 LAB — COMPREHENSIVE METABOLIC PANEL
ALBUMIN: 1.6 g/dL — AB (ref 3.5–5.0)
ALT: 101 U/L — AB (ref 0–44)
AST: 115 U/L — AB (ref 15–41)
Alkaline Phosphatase: 354 U/L — ABNORMAL HIGH (ref 38–126)
Anion gap: 14 (ref 5–15)
BUN: 47 mg/dL — AB (ref 6–20)
CHLORIDE: 96 mmol/L — AB (ref 98–111)
CO2: 24 mmol/L (ref 22–32)
Calcium: 9.6 mg/dL (ref 8.9–10.3)
Creatinine, Ser: 4.83 mg/dL — ABNORMAL HIGH (ref 0.44–1.00)
GFR calc Af Amer: 10 mL/min — ABNORMAL LOW (ref >60)
GFR calc non Af Amer: 9 mL/min — ABNORMAL LOW (ref >60)
GLUCOSE: 168 mg/dL — AB (ref 70–99)
POTASSIUM: 4.8 mmol/L (ref 3.5–5.1)
Sodium: 134 mmol/L — ABNORMAL LOW (ref 135–145)
TOTAL PROTEIN: 7.4 g/dL (ref 6.5–8.1)
Total Bilirubin: 0.9 mg/dL (ref 0.3–1.2)

## 2018-11-08 LAB — POCT I-STAT 3, ART BLOOD GAS (G3+)
Acid-Base Excess: 2 mmol/L (ref 0.0–2.0)
BICARBONATE: 26.8 mmol/L (ref 20.0–28.0)
O2 Saturation: 98 %
PCO2 ART: 43.1 mmHg (ref 32.0–48.0)
PH ART: 7.403 (ref 7.350–7.450)
PO2 ART: 107 mmHg (ref 83.0–108.0)
Patient temperature: 99.2
TCO2: 28 mmol/L (ref 22–32)

## 2018-11-08 LAB — CBC WITH DIFFERENTIAL/PLATELET
ABS IMMATURE GRANULOCYTES: 0.75 10*3/uL — AB (ref 0.00–0.07)
BASOS ABS: 0.1 10*3/uL (ref 0.0–0.1)
Basophils Relative: 0 %
EOS ABS: 0.2 10*3/uL (ref 0.0–0.5)
Eosinophils Relative: 1 %
HEMATOCRIT: 25.9 % — AB (ref 36.0–46.0)
Hemoglobin: 7.4 g/dL — ABNORMAL LOW (ref 12.0–15.0)
IMMATURE GRANULOCYTES: 5 %
LYMPHS ABS: 1.6 10*3/uL (ref 0.7–4.0)
LYMPHS PCT: 11 %
MCH: 25.3 pg — ABNORMAL LOW (ref 26.0–34.0)
MCHC: 28.6 g/dL — ABNORMAL LOW (ref 30.0–36.0)
MCV: 88.7 fL (ref 80.0–100.0)
Monocytes Absolute: 1.1 10*3/uL — ABNORMAL HIGH (ref 0.1–1.0)
Monocytes Relative: 7 %
NEUTROS ABS: 11.3 10*3/uL — AB (ref 1.7–7.7)
NEUTROS PCT: 76 %
NRBC: 0.7 % — AB (ref 0.0–0.2)
Platelets: 259 10*3/uL (ref 150–400)
RBC: 2.92 MIL/uL — ABNORMAL LOW (ref 3.87–5.11)
RDW: 16.6 % — AB (ref 11.5–15.5)
WBC: 15 10*3/uL — ABNORMAL HIGH (ref 4.0–10.5)

## 2018-11-08 LAB — AMYLASE: Amylase: 44 U/L (ref 28–100)

## 2018-11-08 LAB — GLUCOSE, CAPILLARY
GLUCOSE-CAPILLARY: 73 mg/dL (ref 70–99)
Glucose-Capillary: 95 mg/dL (ref 70–99)

## 2018-11-08 LAB — PROTIME-INR
INR: 1.19
Prothrombin Time: 15 seconds (ref 11.4–15.2)

## 2018-11-08 LAB — LIPASE, BLOOD: Lipase: 23 U/L (ref 11–51)

## 2018-11-08 MED ORDER — HYDROCORTISONE NA SUCCINATE PF 100 MG IJ SOLR
100.0000 mg | Freq: Four times a day (QID) | INTRAMUSCULAR | Status: DC
  Administered 2018-11-08 – 2018-11-09 (×2): 100 mg via INTRAVENOUS
  Filled 2018-11-08 (×2): qty 2

## 2018-11-08 MED ORDER — ROSUVASTATIN CALCIUM 5 MG PO TABS: 10.0000 mg | ORAL_TABLET | Freq: Every evening | ORAL | Status: DC

## 2018-11-08 MED ORDER — MORPHINE SULFATE 2 MG/ML IJ SOLN: 2.0000 mg | Freq: Three times a day (TID) | INTRAMUSCULAR | Status: DC | PRN

## 2018-11-08 MED ORDER — CIPROFLOXACIN IN D5W 400 MG/200ML IV SOLN: 400.0000 mg | Freq: Two times a day (BID) | INTRAVENOUS | Status: DC

## 2018-11-08 MED ORDER — FENTANYL CITRATE (PF) 100 MCG/2ML IJ SOLN
100.0000 ug | Freq: Once | INTRAMUSCULAR | Status: AC
  Administered 2018-11-08: 100 ug via INTRAVENOUS
  Filled 2018-11-08: qty 2

## 2018-11-08 MED ORDER — POLYETHYLENE GLYCOL 3350 17 G PO PACK: 17.0000 g | PACK | Freq: Every day | ORAL | Status: DC | PRN

## 2018-11-08 MED ORDER — SODIUM CHLORIDE 0.9% FLUSH
3.0000 mL | Freq: Two times a day (BID) | INTRAVENOUS | Status: DC
  Administered 2018-11-08: 3 mL via INTRAVENOUS

## 2018-11-08 MED ORDER — ONDANSETRON HCL 4 MG/2ML IJ SOLN: 4.0000 mg | Freq: Four times a day (QID) | INTRAMUSCULAR | Status: DC | PRN

## 2018-11-08 MED ORDER — PANTOPRAZOLE SODIUM 40 MG PO PACK
40.0000 mg | PACK | Freq: Two times a day (BID) | ORAL | Status: DC
  Administered 2018-11-08: 40 mg
  Filled 2018-11-08: qty 20

## 2018-11-08 MED ORDER — INSULIN GLARGINE 100 UNIT/ML ~~LOC~~ SOLN: 15.0000 [IU] | Freq: Every day | SUBCUTANEOUS | Status: DC

## 2018-11-08 MED ORDER — ONDANSETRON HCL 4 MG/5ML PO SOLN
4.0000 mg | Freq: Three times a day (TID) | ORAL | Status: DC | PRN
  Filled 2018-11-08: qty 5

## 2018-11-08 MED ORDER — FENTANYL CITRATE (PF) 100 MCG/2ML IJ SOLN
25.0000 ug | INTRAMUSCULAR | Status: DC | PRN
  Administered 2018-11-09: 50 ug via INTRAVENOUS
  Filled 2018-11-08: qty 2

## 2018-11-08 MED ORDER — SODIUM CHLORIDE 0.9 % IV SOLN: 250.0000 mL | INTRAVENOUS | Status: DC | PRN

## 2018-11-08 MED ORDER — ADULT MULTIVITAMIN LIQUID CH: 15.0000 mL | Freq: Every day | ORAL | Status: DC

## 2018-11-08 MED ORDER — ACETAMINOPHEN 650 MG RE SUPP: 650.0000 mg | Freq: Four times a day (QID) | RECTAL | Status: DC | PRN

## 2018-11-08 MED ORDER — MIDODRINE HCL 5 MG PO TABS: 2.5000 mg | ORAL_TABLET | Freq: Three times a day (TID) | ORAL | Status: DC

## 2018-11-08 MED ORDER — INSULIN ASPART 100 UNIT/ML ~~LOC~~ SOLN
0.0000 [IU] | SUBCUTANEOUS | Status: DC
  Administered 2018-11-09: 4 [IU] via SUBCUTANEOUS

## 2018-11-08 MED ORDER — PHENYLEPHRINE HCL-NACL 20-0.9 MG/250ML-% IV SOLN
0.0000 ug/min | INTRAVENOUS | Status: DC
  Administered 2018-11-08: 20 ug/min via INTRAVENOUS
  Administered 2018-11-09: 65 ug/min via INTRAVENOUS
  Administered 2018-11-09: 75 ug/min via INTRAVENOUS
  Administered 2018-11-09: 65 ug/min via INTRAVENOUS
  Filled 2018-11-08 (×4): qty 250

## 2018-11-08 MED ORDER — ORAL CARE MOUTH RINSE
15.0000 mL | OROMUCOSAL | Status: DC
  Administered 2018-11-09 (×4): 15 mL via OROMUCOSAL

## 2018-11-08 MED ORDER — ONDANSETRON HCL 4 MG PO TABS: 4.0000 mg | ORAL_TABLET | Freq: Four times a day (QID) | ORAL | Status: DC | PRN

## 2018-11-08 MED ORDER — HYDROCODONE-ACETAMINOPHEN 5-325 MG PO TABS: 1.0000 | ORAL_TABLET | Freq: Four times a day (QID) | ORAL | Status: DC | PRN

## 2018-11-08 MED ORDER — IPRATROPIUM-ALBUTEROL 0.5-2.5 (3) MG/3ML IN SOLN: 3.0000 mL | RESPIRATORY_TRACT | Status: DC | PRN

## 2018-11-08 MED ORDER — HEPARIN SODIUM (PORCINE) PF 5000 UNIT/ML IJ SOLN: 5000.0000 [IU] | Freq: Two times a day (BID) | INTRAMUSCULAR | Status: DC

## 2018-11-08 MED ORDER — MIDODRINE HCL 5 MG PO TABS
10.0000 mg | ORAL_TABLET | Freq: Three times a day (TID) | ORAL | Status: DC
  Filled 2018-11-08: qty 2

## 2018-11-08 MED ORDER — VITAL HIGH PROTEIN PO LIQD: 1000.0000 mL | ORAL | Status: DC

## 2018-11-08 MED ORDER — TRAZODONE HCL 50 MG PO TABS: 50.0000 mg | ORAL_TABLET | Freq: Every evening | ORAL | Status: DC | PRN

## 2018-11-08 MED ORDER — SODIUM CHLORIDE 0.9 % IV SOLN
2.0000 g | INTRAVENOUS | Status: DC
  Administered 2018-11-08 – 2018-11-09 (×2): 2 g via INTRAVENOUS
  Filled 2018-11-08 (×2): qty 20

## 2018-11-08 MED ORDER — HEPARIN SODIUM (PORCINE) 5000 UNIT/ML IJ SOLN: 5000.0000 [IU] | Freq: Three times a day (TID) | INTRAMUSCULAR | Status: DC

## 2018-11-08 MED ORDER — ACETAMINOPHEN 160 MG/5ML PO SOLN: 650.0000 mg | Freq: Four times a day (QID) | ORAL | Status: DC | PRN

## 2018-11-08 MED ORDER — CHLORHEXIDINE GLUCONATE 0.12% ORAL RINSE (MEDLINE KIT)
15.0000 mL | Freq: Two times a day (BID) | OROMUCOSAL | Status: DC
  Administered 2018-11-09 (×2): 15 mL via OROMUCOSAL

## 2018-11-08 MED ORDER — HYDROCORTISONE NA SUCCINATE PF 100 MG IJ SOLR: 100.0000 mg | Freq: Four times a day (QID) | INTRAMUSCULAR | Status: DC

## 2018-11-08 MED ORDER — SODIUM CHLORIDE 0.9 % IV SOLN
25.0000 mg | Freq: Four times a day (QID) | INTRAVENOUS | Status: DC | PRN
  Filled 2018-11-08: qty 0.5

## 2018-11-08 MED ORDER — THERA M PLUS PO TABS: 1.0000 | ORAL_TABLET | Freq: Every day | ORAL | Status: DC

## 2018-11-08 MED ORDER — MIDODRINE HCL 5 MG PO TABS
2.5000 mg | ORAL_TABLET | Freq: Three times a day (TID) | ORAL | Status: DC
  Administered 2018-11-08: 2.5 mg via ORAL
  Filled 2018-11-08: qty 1

## 2018-11-08 MED ORDER — MAGNESIUM OXIDE 400 (241.3 MG) MG PO TABS: 400.0000 mg | ORAL_TABLET | Freq: Every day | ORAL | Status: DC

## 2018-11-08 MED ORDER — PRO-STAT SUGAR FREE PO LIQD: 30.0000 mL | Freq: Three times a day (TID) | ORAL | Status: DC

## 2018-11-08 MED ORDER — METRONIDAZOLE IN NACL 5-0.79 MG/ML-% IV SOLN
500.0000 mg | Freq: Three times a day (TID) | INTRAVENOUS | Status: DC
  Administered 2018-11-08 – 2018-11-09 (×3): 500 mg via INTRAVENOUS
  Filled 2018-11-08 (×6): qty 100

## 2018-11-08 MED ORDER — ASPIRIN 81 MG PO CHEW: 81.0000 mg | CHEWABLE_TABLET | Freq: Every day | ORAL | Status: DC

## 2018-11-08 MED ORDER — SODIUM CHLORIDE 0.9% FLUSH: 3.0000 mL | INTRAVENOUS | Status: DC | PRN

## 2018-11-08 MED ORDER — HEPARIN SODIUM (PORCINE) 5000 UNIT/ML IJ SOLN
5000.0000 [IU] | Freq: Three times a day (TID) | INTRAMUSCULAR | Status: DC
  Administered 2018-11-08: 5000 [IU] via SUBCUTANEOUS
  Filled 2018-11-08 (×3): qty 1

## 2018-11-08 MED ORDER — ATORVASTATIN CALCIUM 10 MG PO TABS
10.0000 mg | ORAL_TABLET | Freq: Every day | ORAL | Status: DC
  Administered 2018-11-08: 10 mg via ORAL
  Filled 2018-11-08: qty 1

## 2018-11-08 MED ORDER — SODIUM CHLORIDE 0.9 % IV SOLN: INTRAVENOUS | Status: DC

## 2018-11-08 MED ORDER — POLYETHYLENE GLYCOL 3350 17 G PO PACK: 17.0000 g | PACK | Freq: Every day | ORAL | Status: DC

## 2018-11-08 MED ORDER — ACETAMINOPHEN 325 MG PO TABS: 650.0000 mg | ORAL_TABLET | Freq: Four times a day (QID) | ORAL | Status: DC | PRN

## 2018-11-08 MED ORDER — POTASSIUM CHLORIDE 10 MEQ/100ML IV SOLN: 10.0000 meq | INTRAVENOUS | Status: DC | PRN

## 2018-11-08 MED ORDER — ALBUTEROL SULFATE (2.5 MG/3ML) 0.083% IN NEBU: 2.5000 mg | INHALATION_SOLUTION | RESPIRATORY_TRACT | Status: DC | PRN

## 2018-11-08 NOTE — ED Provider Notes (Addendum)
[facility] EMERGENCY DEPARTMENT Provider Note   CSN: [id number] Arrival date & time: 11/08/18  1258     History   Chief Complaint Chief Complaint  Patient presents with  . Abdominal Pain    HPI Catherine Williams is a 57 y.o. female with history of diabetes mellitus, ESRD now on dialysis Monday Wednesday Friday, CHF, COPD now trach/vent dependent [facility] of acute onset, progressively worsening right-sided abdominal pain.  She was recently seen in the ED on 10/07/2018 after witnessed cardiac arrest.  Had recently had dialysis graft placed but had not yet started dialysis.  She was admitted with multiple episodes of PEA cardiac arrest thought to be due to mucous plugging.  She is now vent dependent, with plan to wean her off if possible.  [facility]  She notes that for the past several days she has had intermittent severe right-sided abdominal pains.  She has had a few episodes of nonbloody emesis in the past few days.  She states that the pain worsens with any movement.  Of note, CT of the pelvis without contrast performed on 10/30/2018 shows cholelithiasis with no evidence of acute cholecystitis.  [facility] worsening LFTs and CT scan shows evidence of acute cholecystitis. She is a full code.  The history is provided by the patient.    Past Medical History:  Diagnosis Date  . Acute on chronic respiratory failure with hypoxia ([place])   . Anemia   . Arthritis Dec. 2014   Gout  . Asthma   . Cardiac arrest ([place])   . CHF (congestive heart failure) ([place])   . Chronic diastolic heart failure of unknown etiology ([place])   . COPD (chronic obstructive pulmonary disease) ([place])   . Diabetes mellitus   . End stage renal disease on dialysis ([place])   . GERD (gastroesophageal reflux disease)   . Healthcare-associated pneumonia   . Heart  failure   . Hyperlipidemia   . Hypertension   . Increased tracheal secretions   . Kidney disease   . Morbid obesity ([place])   . Peripheral vascular disease ([place])   . Pinched nerve   . Requires supplemental oxygen    as needed  . Sleep apnea    wears cpap  . Wears dentures     Patient Active Problem List   Diagnosis Date Noted  . Acute cholecystitis 11/08/2018  . Acute on chronic respiratory failure with hypoxia ([place])   . Cardiac arrest ([place])   . Increased tracheal secretions   . End stage renal disease on dialysis ([place])   . Chronic diastolic heart failure of unknown etiology ([place])   . Healthcare-associated pneumonia   . Tracheostomy care ([place])   . Cardiogenic shock ([place])   . Tracheostomy status ([place])   . Pressure injury of skin 10/16/2018  . Ventilator dependent ([place])   . Endotracheal tube present   . Mucus plugging of bronchi   . Acute kidney injury ([place])   . CKD (chronic kidney disease) stage 5, GFR less than 15 ml/min (H[facility])   . Cardiac arrest ([place]) 10/07/2018  . Hypertensive urgency 05/13/2018  . Fall 02/08/2018  . Chronic respiratory failure with hypoxia ([place]) 02/08/2018  . Respiratory failure ([place]) 11/01/2016  . Neuropathy 10/31/2016  . Shortness of breath 10/23/2016  . Acute on chronic diastolic CHF (congestive heart failure) ([place]) 10/23/2016  . GERD (gastroesophageal reflux disease)  10/23/2016  . Severe obesity (BMI >= 40) ([place]) 08/28/2015  . Dyspnea 08/27/2015  . CKD (chronic kidney disease), stage IV ([place]) 07/17/2015  . Insulin-requiring or dependent type II diabetes mellitus ([place]) 07/17/2015  . Chronic anemia 07/17/2015  . Hypertension 07/17/2015  . Peripheral vascular disease, unspecified ([place]) 05/07/2012  . OSA (obstructive sleep apnea) 04/05/2011    Past Surgical History:  Procedure Laterality Date  . Atherectomy and angioplasty  10/18/2011   left posterior tibial artery  . AV FISTULA PLACEMENT Left 10/04/2018   Procedure: ARTERIOVENOUS (AV) FISTULA  CREATION LEFT ARM;  Surgeon: Serafina Mitchell, MD;  Location: [facility];  Service: Vascular;  Laterality: Left;  . COLONOSCOPY    . HAND SURGERY     right  . IR FLUORO GUIDE CV LINE LEFT  10/26/2018  . IR US GUIDE VASC ACCESS LEFT  10/26/2018  . LEFT HEART CATH AND CORONARY ANGIOGRAPHY N/A 10/19/2018   Procedure: LEFT HEART CATH AND CORONARY ANGIOGRAPHY;  Surgeon: Leonie Man, MD;  Location: [facility];  Service: Cardiovascular;  Laterality: N/A;  . MULTIPLE TOOTH EXTRACTIONS    . OVARY SURGERY     Left  . TOE AMPUTATION  Sept. 25,2012   Left 4th and 5th toes     OB History   None      Home Medications    Prior to Admission medications   Medication Sig Start Date End Date Taking? Authorizing Provider  acetaminophen (TYLENOL) 325 MG tablet Take 650 mg by mouth every 6 (six) hours as needed.   Yes [provider]  atorvastatin (LIPITOR) 10 MG tablet Take 10 mg by mouth daily.   Yes [provider]  ciprofloxacin (CIPRO IN D5W) 400 MG/200ML SOLN Inject 400 mg into the vein every 12 (twelve) hours.   Yes [provider]  dextrose 50 % solution Inject 50 mLs into the vein as needed for low blood sugar.   Yes [provider]  Dextrose-Sodium Chloride (DEXTROSE 5 % AND 0.45% NACL) infusion Inject 1,000 mLs into the vein as needed.   Yes [provider]  diphenhydrAMINE 25 mg in sodium chloride 0.9 % 50 mL Inject 25 mg into the vein every 6 (six) hours as needed (Itching).   Yes [provider]  famotidine (PEPCID) 20 MG tablet Take 20 mg by mouth 2 (two) times daily.   Yes [provider]  Heparin Sodium, Porcine, PF 5000 UNIT/ML SOLN Inject 5,000 Units as directed 2 (two) times daily.   Yes [provider]  HYDROcodone-acetaminophen (NORCO/VICODIN) 5-325 MG tablet Take 1 tablet by mouth every 6 (six) hours as needed for moderate pain.   Yes [provider]  insulin glargine (LANTUS) 100 UNIT/ML  injection Inject 0.3 mLs (30 Units total) into the skin daily. 11/01/18  Yes Buriev, Arie Sabina, MD  insulin lispro (HUMALOG) 100 UNIT/ML injection Inject 2-10 Units into the skin every 6 (six) hours. Sliding Scale\\   From 70-150 = 0 units           151-200= 2 units           201-250 = 4 units           251-300 = 6 units           301-350 = 8 units            351-400 = 10 units For Blood Glucose above 400, give 12 units CALL MD and obtain stat lab verification  Yes [provider]  ipratropium-albuterol (DUONEB) 0.5-2.5 (3) MG/3ML SOLN Take 3 mLs by nebulization 3 (three) times daily. Patient taking differently: Take 3 mLs by nebulization as needed.  11/01/18  Yes Buriev, Arie Sabina, MD  magnesium oxide (MAG-OX) 400 MG tablet Take 400 mg by mouth as needed.   Yes [provider]  Magnesium Sulfate 1000 MG/1.6ML SOLN Inject 1 g into the vein as needed. In 100 Ml Drip rate 200 ml/h1   Yes [provider]  metronidazole (FLAGYL) 500-0.74 MG/100ML-% Inject 500 mg into the vein every 8 (eight) hours. NaCl 0.79% Drip Rate : 100 ml/hr Give after dialysis and dialysis days   Yes [provider]  midodrine (PROAMATINE) 2.5 MG tablet Take 1 tablet (2.5 mg total) by mouth 3 (three) times daily with meals. 11/01/18  Yes Buriev, Arie Sabina, MD  morphine 2 MG/ML injection Inject 2 mg into the vein every 8 (eight) hours as needed.   Yes [provider]  Multiple Vitamins-Minerals (MULTIVITAMINS THER. W/MINERALS) TABS tablet Take 1 tablet by mouth daily.   Yes [provider]  Nutritional Supplements (FEEDING SUPPLEMENT, VITAL HIGH PROTEIN,) LIQD liquid Place 1,000 mLs into feeding tube continuous. 11/01/18  Yes Buriev, Arie Sabina, MD  ondansetron [facility]) 4 MG/5ML solution Take 4 mg by mouth every 8 (eight) hours as needed for nausea or vomiting.   Yes [provider]  polyethylene glycol (MIRALAX / GLYCOLAX) packet Take 17 g by mouth daily.   Yes  [provider]  Potassium Bicarb & Chloride (EFFERVESCENT POT CHLORIDE PO) Take 80 mEq by mouth as needed.   Yes [provider]  potassium chloride 10 MEQ/100ML Inject 10 mEq into the vein as needed (Drip Rate 4.167).   Yes [provider]  sodium polystyrene (KAYEXALATE) 15 GM/60ML suspension Take 15-30 g by mouth as needed.   Yes [provider]  acetaminophen (TYLENOL) 160 MG/5ML solution Place 20.3 mLs (650 mg total) into feeding tube every 6 (six) hours as needed for mild pain or fever. Patient not taking: Reported on 11/08/2018 11/01/18   Kinnie Feil, MD  allopurinol (ZYLOPRIM) 100 MG tablet Place 1 tablet (100 mg total) into feeding tube at bedtime. Patient not taking: Reported on 11/08/2018 11/01/18   Kinnie Feil, MD  Amino Acids-Protein Hydrolys (FEEDING SUPPLEMENT, PRO-STAT SUGAR FREE 64,) LIQD Place 30 mLs into feeding tube 3 (three) times daily. Patient not taking: Reported on 11/08/2018 11/01/18   Kinnie Feil, MD  aspirin 81 MG tablet Place 1 tablet (81 mg total) into feeding tube daily. Patient not taking: Reported on 11/08/2018 11/01/18   Kinnie Feil, MD  chlorhexidine gluconate, MEDLINE KIT, (PERIDEX) 0.12 % solution 15 mLs by Mouth Rinse route 2 (two) times daily. Patient not taking: Reported on 11/08/2018 11/01/18   Kinnie Feil, MD  insulin aspart (NOVOLOG) 100 UNIT/ML injection Inject 0-20 Units into the skin every 4 (four) hours. Insulin sliding scale Patient not taking: Reported on 11/08/2018 11/01/18   Kinnie Feil, MD  Multiple Vitamin (MULTIVITAMIN) LIQD Place 15 mLs into feeding tube daily. Patient not taking: Reported on 11/08/2018 11/01/18   Kinnie Feil, MD  pantoprazole sodium (PROTONIX) 40 mg/20 mL PACK Place 20 mLs (40 mg total) into feeding tube daily. Patient not taking: Reported on 11/08/2018 11/01/18   Kinnie Feil, MD  rosuvastatin (CRESTOR) 10 MG tablet Place 1 tablet (10 mg  total) into feeding tube every evening. Patient not taking: Reported on 11/08/2018  11/01/18   Kinnie Feil, MD    Family History Family History  Problem Relation Age of Onset  . Lung cancer Mother   . Clotting disorder Mother   . Heart disease Mother   . Cancer Mother        Lung  . Deep vein thrombosis Mother   . Diabetes Mother   . Hypertension Mother   . Varicose Veins Mother   . Cancer Father   . Clotting disorder Sister   . Heart attack Sister   . Diabetes Sister   . Clotting disorder Brother   . Deep vein thrombosis Brother   . Diabetes Brother   . Heart disease Brother   . Heart disease Sister     Social History Social History   Tobacco Use  . Smoking status: Never Smoker  . Smokeless tobacco: Never Used  Substance Use Topics  . Alcohol use: No    Alcohol/week: 0.0 standard drinks  . Drug use: Not Currently    Types: Marijuana    Comment: FORMER>>smoked weed from age 84 to 38>> quit at age 72      Allergies   Omnipaque [iohexol]   Review of Systems Review of Systems  Constitutional: Negative for fever.  Gastrointestinal: Positive for abdominal pain, nausea and vomiting.  All other systems reviewed and are negative.    Physical Exam Updated Vital Signs BP (!) 103/46   Pulse 80   Temp 99.9 F (37.7 C) (Oral)   Resp 20   SpO2 99%   Physical Exam  Constitutional: She appears well-developed and well-nourished. No distress.  HENT:  Head: Normocephalic and atraumatic.  Eyes: Conjunctivae are normal. Right eye exhibits no discharge. Left eye exhibits no discharge.  Neck: No JVD present. No tracheal deviation present.  Cardiovascular: Normal rate and intact distal pulses.  Distant heart sounds.   Pulmonary/Chest: Effort normal.  Trach in place.  Diffuse rhonchi and wheezes noted  Abdominal: Soft. She exhibits no distension. Bowel sounds are decreased. There is tenderness in the right upper quadrant and right lower quadrant. There is guarding  and positive Murphy's sign. There is no CVA tenderness.  Musculoskeletal: She exhibits no edema.  Neurological: She is alert.  Skin: Skin is warm and dry. No erythema.  Psychiatric: She has a normal mood and affect. Her behavior is normal.  Nursing note and vitals reviewed.    ED Treatments / Results  Labs (all labs ordered are listed, but only abnormal results are displayed) Labs Reviewed  PROTIME-INR    EKG None  Radiology Ct Abdomen Pelvis Wo Contrast  Result Date: 11/08/2018 CLINICAL DATA:  Unspecified abdominal pain. EXAM: CT ABDOMEN AND PELVIS WITHOUT CONTRAST TECHNIQUE: Multidetector CT imaging of the abdomen and pelvis was performed following the standard protocol without IV contrast. COMPARISON:  CT pelvis 10/30/2018 FINDINGS: Lower chest: Consolidation or atelectasis in the right lung base. Small right pleural effusion. Cardiac enlargement. Coronary artery calcifications. Hepatobiliary: Multiple stones in the gallbladder. Gallbladder wall thickening with stranding in the pericholecystic fat. Changes likely to represent cholecystitis. No bile duct dilatation. No focal liver lesions. Pancreas: Unremarkable. No pancreatic ductal dilatation or surrounding inflammatory changes. Spleen: Normal in size without focal abnormality. Adrenals/Urinary Tract: No adrenal gland nodules. Multiple renal cysts on the right. No hydronephrosis or hydroureter. Bladder wall is not thickened. Stomach/Bowel: Stomach, small bowel, and colon are not abnormally distended. Enteric tube with tip in the stomach. Stool fills the colon. Vascular/Lymphatic: Prominent mesenteric and celiac axis vascular calcifications. No aneurysm.  Reproductive: Uterus and ovaries are not enlarged. Gonadal vein phleboliths. Other: No free air or free fluid in the abdomen. Abdominal wall musculature is lax without any discrete hernia. Musculoskeletal: Degenerative changes in the spine. No destructive bone lesions. IMPRESSION: 1.  Cholelithiasis with inflammatory changes in the gallbladder consistent with acute cholecystitis. 2. Consolidation or atelectasis in the right lung base with small right pleural effusion. Aortic Atherosclerosis (ICD10-I70.0). Electronically Signed   By: Lucienne Capers M.D.   On: 11/08/2018 01:32   Dg Abd Portable 1v  Result Date: 11/07/2018 CLINICAL DATA:  57 y/o  F; feeding tube position. EXAM: PORTABLE ABDOMEN - 1 VIEW COMPARISON:  11/04/2018 abdomen radiographs. FINDINGS: Enteric tube tip projects over the proximal gastric body. Mild gastric distention. Moderate volume of stool in the colon. Bones are unremarkable. IMPRESSION: Enteric tube tip projects over the proximal gastric body. Electronically Signed   By: Kristine Garbe M.D.   On: 11/07/2018 06:17    Procedures Procedures (including critical care time)  Medications Ordered in ED Medications  cefTRIAXone (ROCEPHIN) 2 g in sodium chloride 0.9 % 100 mL IVPB (2 g Intravenous New Bag/Given 11/08/18 1546)  fentaNYL (SUBLIMAZE) injection 100 mcg (100 mcg Intravenous Given 11/08/18 1415)     Initial Impression / Assessment and Plan / ED Course  I have reviewed the triage vital signs and the nursing notes.  Pertinent labs & imaging results that were available during my care of the patient were reviewed by me and considered in my medical decision making (see chart for details).   [facility]ole[facility]stitis.  Patient is afebrile, soft blood pressures but does not appear to be septic at this time.  She is alert, answering questions appropriately.  Lab work performed earlier today shows leukocytosis although this is actually improved from more recent labs.  LFTs are worsening, bilirubin within normal limits.  CT scan shows evidence of acute cholecystitis. Patient has apparently already received IV Cipro and Flagyl [facility] today. 1:30 PM Spoke with  Margie Billet with general surgery. They will evaluate the patient and recommend hospitalist admission.  2:33 PM General surgery has seen and evaluated the patient and feel that she is not a good surgical candidate.  I spoke with Dr. Anselm Pancoast with interventional radiology who will see and assess the patient.  Likely plan for IR procedure with either hospitalist admission or [facility]. 4:00PM Plan for IR percutaneous cholecystostomy, hospitalist admission, with general surgery to follow in consultation while patient is in the hospital.  Spoke with Dr. Denton Brick with [facility] hospitalist service who agrees to assume care of patient and bring her into the hospital for further evaluation and management.  Spoke with Dr. Lynetta Mare with PCCM with regards to vent management.   5:08 PM Addendum: Dr. Denton Brick unable to admit patient to SDU due to hospital policy. PCCM to admit to ICU for further management.   Final Clinical Impressions(s) / ED Diagnoses   Final diagnoses:  Acute cholecystitis    ED Discharge Orders    None       Renita Papa, PA-C 11/08/18 1649    Renita Papa, PA-C 11/08/18 1709    Maudie Flakes, MD 11/09/18 [phone]

## 2018-11-08 NOTE — H&P (Signed)
NAME:  Catherine Williams, MRN:  [id number], DOB:  [birth year], LOS: 0 ADMISSION DATE:  11/08/2018, CONSULTATION DATE:  11/08/2018 REFERRING MD:  Dr. Sedonia Small, CHIEF COMPLAINT:  VDRF  Brief History   57 yoF VDRF from elect sent admitted with acute cholecystitis.   History of present illness   57 year old with PMH significant for but not limited to VDRF s/p trach, COPD, diastolic HF, DM, ESRD (MWF), morbidly obese, anemia of chronic disease, and recent multiple cardiac arrest (Etiology not completely clear but felt related to mucous plugging, had cardiac cath on prior admit that was non-diagnostic) presenting from [facility] 11/08/18 for progressive worsening of right abdominal pain for 1-2 weeks with associated nausea, vomiting, and diarrhea.  No reports of fever.  CT abd showed acute cholecystitis.  Patient hemodynamically stable.  Surgery consulted and felt to be too high risk for surgery with recommendations for percutaneous drain by IR.  PCCM asked to admit to ICU.  Past Medical History  VDRF s/p trach, COPD, diastolic HF, DM, ESRD (MWF), morbidly obese, anemia of chronic disease, multiple cardiac arrest, OSA  Significant Hospital Events   11/21 Admit  Consults:  11/21 CCS 11/21 PCCM -vent management 11/21 IR  Procedures:   Significant Diagnostic Tests:  11/21 CT abd/pelvis > Cholelithiasis with inflammatory changes in the gallbladder consistent with acute cholecystitis. Consolidation or atelectasis in the right lung base with small right pleural effusion.  Micro Data:  None  Antimicrobials:  Cipro 11/21 > 11/21. Ceftriaxone 11/21 >  Flagyl 11/21 >  Interim history/subjective:  Sleepy but arouses easily to voice.  Objective   Blood pressure (!) 103/46, pulse 80, temperature 99.9 F (37.7 C), temperature source Oral, resp. rate 20, SpO2 99 %.    Vent Mode: PRVC FiO2 (%):  [28 %] 28 % Set Rate:  [20 bmp] 20 bmp Vt Set:  [400 mL] 400 mL PEEP:  [5 cmH20] 5 cmH20    No intake or output data in the 24 hours ending 11/08/18 1636 There were no vitals filed for this visit.  Examination: General: Adult female, resting in bed, in NAD. Neuro: Sleepy but easily arouses to voice. HEENT: [place]/AT. Sclerae anicteric, Trach C/D/I. Cardiovascular: RRR, no M/R/G.  Lungs: Respirations even and unlabored.  CTA bilaterally, No W/R/R. Abdomen: Obese, BS x 4, soft.  Mild tenderness RUQ and epigastrium.  Musculoskeletal: No gross deformities, no edema.  Skin: Intact, warm, no rashes.   Assessment & Plan:   Acute cholecystitis. - Per surgery not a surgical candidate; therefore, they will consult IR for percutaneous cholecystostomy tube.  Chronic respiratory failure/ VDRF s/p trach (10/19/2018). - Continue full MV support- PRVC TV 400/ rate 20/ PEEP 5/ 28% FIO2. - VAP protocol.  - Trach care per protocol.  - CXR intermittently.  COPD without acute exacerbation. - Continue DuoNebs / Albuterol.   AoCKD - now dialysis dependent (since October 2019). - Day team to please consult nephrology for HD.  Anemia of chronic disease. - Transfuse for Hgb < 7.  Hx hypotension (on midodrine), HLD, CHF, multiple cardiac arrests. - Continue preadmission midodrine, atorvastatin, ASA.  Hx DM. - SSI.   Best practice:  Diet: NPO Pain/Anxiety/Delirium protocol (if indicated): Fentanyl PRN. VAP protocol (if indicated): In place. DVT prophylaxis: SCD's / heparin. GI prophylaxis: Pantoprazole. Glucose control: SSI. Mobility: Bedrest. Code Status: Full. Family Communication: None available. Disposition: ICU for tonight.  Will ask TRH to assume care starting in AM 11/22 and can transfer to SDU.  Labs   CBC: Recent Labs  Lab 11/02/18 1150 11/05/18 0647 11/06/18 0502 11/08/18 1121  WBC 11.9* 26.0* 25.9* 15.0*  NEUTROABS  --   --   --  11.3*  HGB 8.8* 7.6* 7.7* 7.4*  HCT 30.0* 26.4* 27.0* 25.9*  MCV 87.5 87.7 89.7 88.7  PLT 234 284 263 802    Basic Metabolic  Panel: Recent Labs  Lab 11/02/18 1150 11/05/18 0647 11/08/18 1121  NA 134* 132* 134*  K 4.0 4.2 4.8  CL 93* 94* 96*  CO2 25 25 24   GLUCOSE 134* 136* 168*  BUN 80* 74* 47*  CREATININE 6.07* 7.00* 4.83*  CALCIUM 10.0 9.6  9.7 9.6  PHOS  --  5.4*  --    GFR: Estimated Creatinine Clearance: 16.7 mL/min (A) (by C-G formula based on SCr of 4.83 mg/dL (H)). Recent Labs  Lab 11/02/18 1150 11/05/18 0647 11/06/18 0502 11/08/18 1121  WBC 11.9* 26.0* 25.9* 15.0*    Liver Function Tests: Recent Labs  Lab 11/05/18 0647 11/08/18 1121  AST  --  115*  ALT  --  101*  ALKPHOS  --  354*  BILITOT  --  0.9  PROT  --  7.4  ALBUMIN 1.9* 1.6*   Recent Labs  Lab 11/08/18 1121  LIPASE 23  AMYLASE 44   No results for input(s): AMMONIA in the last 168 hours.  ABG    Component Value Date/Time   PHART 7.318 (L) 10/19/2018 1953   PCO2ART 28.8 (L) 10/19/2018 1953   PO2ART 257.0 (H) 10/19/2018 1953   HCO3 14.7 (L) 10/19/2018 1953   TCO2 16 (L) 10/19/2018 1953   ACIDBASEDEF 10.0 (H) 10/19/2018 1953   O2SAT 100.0 10/19/2018 1953     Coagulation Profile: Recent Labs  Lab 11/08/18 1546  INR 1.19    Cardiac Enzymes: No results for input(s): CKTOTAL, CKMB, CKMBINDEX, TROPONINI in the last 168 hours.  HbA1C: Hgb A1c MFr Bld  Date/Time Value Ref Range Status  10/23/2016 02:54 PM 8.0 (H) 4.8 - 5.6 % Final    Comment:    (NOTE)         Pre-diabetes: 5.7 - 6.4         Diabetes: >6.4         Glycemic control for adults with diabetes: <7.0   07/17/2015 02:09 AM 9.4 (H) 4.8 - 5.6 % Final    Comment:    (NOTE)         Pre-diabetes: 5.7 - 6.4         Diabetes: >6.4         Glycemic control for adults with diabetes: <7.0     CBG: No results for input(s): GLUCAP in the last 168 hours.  Review of Systems:   All negative; except for those that are bolded, which indicate positives.  Constitutional: weight loss, weight gain, night sweats, fevers, chills, fatigue, weakness.    HEENT: headaches, sore throat, sneezing, nasal congestion, post nasal drip, difficulty swallowing, tooth/dental problems, visual complaints, visual changes, ear aches. Neuro: difficulty with speech, weakness, numbness, ataxia. CV:  chest pain, orthopnea, PND, swelling in lower extremities, dizziness, palpitations, syncope.  Resp: cough, hemoptysis, dyspnea, wheezing. GI: heartburn, indigestion, abdominal pain, nausea, vomiting, diarrhea, constipation, change in bowel habits, loss of appetite, hematemesis, melena, hematochezia.  GU: dysuria, change in color of urine, urgency or frequency, flank pain, hematuria. MSK: joint pain or swelling, decreased range of motion. Psych: change in mood or affect, depression, anxiety, suicidal ideations, homicidal ideations. Skin: rash,  itching, bruising.   Past Medical History  She,  has a past medical history of Acute on chronic respiratory failure with hypoxia ([place]), Anemia, Arthritis (Dec. 2014), Asthma, Cardiac arrest [facility]), CHF (congestive heart failure) ([place]), Chronic diastolic heart failure of unknown etiology ([place]), COPD (chronic obstructive pulmonary disease) ([place]), Diabetes mellitus, End stage renal disease on dialysis ([place]), GERD (gastroesophageal reflux disease), Healthcare-associated pneumonia, Heart failure, Hyperlipidemia, Hypertension, Increased tracheal secretions, Kidney disease, Morbid obesity ([place]), Peripheral vascular disease ([place]), Pinched nerve, Requires supplemental oxygen, Sleep apnea, and Wears dentures.   Surgical History    Past Surgical History:  Procedure Laterality Date  . Atherectomy and angioplasty  10/18/2011   left posterior tibial artery  . AV FISTULA PLACEMENT Left 10/04/2018   Procedure: ARTERIOVENOUS (AV) FISTULA CREATION LEFT ARM;  Surgeon: Serafina Mitchell, MD;  Location: [facility];  Service: Vascular;  Laterality: Left;  . COLONOSCOPY    . HAND SURGERY     right  . IR FLUORO GUIDE CV LINE LEFT  10/26/2018  . IR US  GUIDE VASC ACCESS LEFT  10/26/2018  . LEFT HEART CATH AND CORONARY ANGIOGRAPHY N/A 10/19/2018   Procedure: LEFT HEART CATH AND CORONARY ANGIOGRAPHY;  Surgeon: Leonie Man, MD;  Location: [facility];  Service: Cardiovascular;  Laterality: N/A;  . MULTIPLE TOOTH EXTRACTIONS    . OVARY SURGERY     Left  . TOE AMPUTATION  Sept. 25,2012   Left 4th and 5th toes     Social History   reports that she has never smoked. She has never used smokeless tobacco. She reports that she has current or past drug history. Drug: Marijuana. She reports that she does not drink alcohol.   Family History   Her family history includes Cancer in her father and mother; Clotting disorder in her brother, mother, and sister; Deep vein thrombosis in her brother and mother; Diabetes in her brother, mother, and sister; Heart attack in her sister; Heart disease in her brother, mother, and sister; Hypertension in her mother; Lung cancer in her mother; Varicose Veins in her mother.   Allergies Allergies  Allergen Reactions  . Omnipaque [Iohexol] Nausea And Vomiting    Contrast Dye- Effects Kidney's     Home Medications  Prior to Admission medications   Medication Sig Start Date End Date Taking? Authorizing Provider  acetaminophen (TYLENOL) 325 MG tablet Take 650 mg by mouth every 6 (six) hours as needed.   Yes [provider]  atorvastatin (LIPITOR) 10 MG tablet Take 10 mg by mouth daily.   Yes [provider]  ciprofloxacin (CIPRO IN D5W) 400 MG/200ML SOLN Inject 400 mg into the vein every 12 (twelve) hours.   Yes [provider]  dextrose 50 % solution Inject 50 mLs into the vein as needed for low blood sugar.   Yes [provider]  Dextrose-Sodium Chloride (DEXTROSE 5 % AND 0.45% NACL) infusion Inject 1,000 mLs into the vein as needed.   Yes [provider]  diphenhydrAMINE 25 mg in sodium chloride 0.9 % 50 mL Inject 25 mg into the vein every 6 (six) hours as  needed (Itching).   Yes [provider]  famotidine (PEPCID) 20 MG tablet Take 20 mg by mouth 2 (two) times daily.   Yes [provider]  Heparin Sodium, Porcine, PF 5000 UNIT/ML SOLN Inject 5,000 Units as directed 2 (two) times daily.   Yes [provider]  HYDROcodone-acetaminophen (NORCO/VICODIN) 5-325 MG tablet Take 1 tablet by mouth  every 6 (six) hours as needed for moderate pain.   Yes [provider]  insulin glargine (LANTUS) 100 UNIT/ML injection Inject 0.3 mLs (30 Units total) into the skin daily. 11/01/18  Yes Buriev, Arie Sabina, MD  insulin lispro (HUMALOG) 100 UNIT/ML injection Inject 2-10 Units into the skin every 6 (six) hours. Sliding Scale\\   From 70-150 = 0 units           151-200= 2 units           201-250 = 4 units           251-300 = 6 units           301-350 = 8 units            351-400 = 10 units For Blood Glucose above 400, give 12 units CALL MD and obtain stat lab verification   Yes [provider]  ipratropium-albuterol (DUONEB) 0.5-2.5 (3) MG/3ML SOLN Take 3 mLs by nebulization 3 (three) times daily. Patient taking differently: Take 3 mLs by nebulization as needed.  11/01/18  Yes Buriev, Arie Sabina, MD  magnesium oxide (MAG-OX) 400 MG tablet Take 400 mg by mouth as needed.   Yes [provider]  Magnesium Sulfate 1000 MG/1.6ML SOLN Inject 1 g into the vein as needed. In 100 Ml Drip rate 200 ml/h1   Yes [provider]  metronidazole (FLAGYL) 500-0.74 MG/100ML-% Inject 500 mg into the vein every 8 (eight) hours. NaCl 0.79% Drip Rate : 100 ml/hr Give after dialysis and dialysis days   Yes [provider]  midodrine (PROAMATINE) 2.5 MG tablet Take 1 tablet (2.5 mg total) by mouth 3 (three) times daily with meals. 11/01/18  Yes Buriev, Arie Sabina, MD  morphine 2 MG/ML injection Inject 2 mg into the vein every 8 (eight) hours as needed.   Yes [provider]  Multiple Vitamins-Minerals  (MULTIVITAMINS THER. W/MINERALS) TABS tablet Take 1 tablet by mouth daily.   Yes [provider]  Nutritional Supplements (FEEDING SUPPLEMENT, VITAL HIGH PROTEIN,) LIQD liquid Place 1,000 mLs into feeding tube continuous. 11/01/18  Yes Buriev, Arie Sabina, MD  ondansetron [facility]) 4 MG/5ML solution Take 4 mg by mouth every 8 (eight) hours as needed for nausea or vomiting.   Yes [provider]  polyethylene glycol (MIRALAX / GLYCOLAX) packet Take 17 g by mouth daily.   Yes [provider]  Potassium Bicarb & Chloride (EFFERVESCENT POT CHLORIDE PO) Take 80 mEq by mouth as needed.   Yes [provider]  potassium chloride 10 MEQ/100ML Inject 10 mEq into the vein as needed (Drip Rate 4.167).   Yes [provider]  sodium polystyrene (KAYEXALATE) 15 GM/60ML suspension Take 15-30 g by mouth as needed.   Yes [provider]  acetaminophen (TYLENOL) 160 MG/5ML solution Place 20.3 mLs (650 mg total) into feeding tube every 6 (six) hours as needed for mild pain or fever. Patient not taking: Reported on 11/08/2018 11/01/18   Kinnie Feil, MD  allopurinol (ZYLOPRIM) 100 MG tablet Place 1 tablet (100 mg total) into feeding tube at bedtime. Patient not taking: Reported on 11/08/2018 11/01/18   Kinnie Feil, MD  Amino Acids-Protein Hydrolys (FEEDING SUPPLEMENT, PRO-STAT SUGAR FREE 64,) LIQD Place 30 mLs into feeding tube 3 (three) times daily. Patient not taking: Reported on 11/08/2018 11/01/18   Kinnie Feil, MD  aspirin 81 MG tablet Place 1 tablet (81 mg total) into feeding tube daily. Patient not taking: Reported on 11/08/2018  11/01/18   Kinnie Feil, MD  chlorhexidine gluconate, MEDLINE KIT, (PERIDEX) 0.12 % solution 15 mLs by Mouth Rinse route 2 (two) times daily. Patient not taking: Reported on 11/08/2018 11/01/18   Kinnie Feil, MD  insulin aspart (NOVOLOG) 100 UNIT/ML injection Inject 0-20 Units into the skin every 4 (four)  hours. Insulin sliding scale Patient not taking: Reported on 11/08/2018 11/01/18   Kinnie Feil, MD  Multiple Vitamin (MULTIVITAMIN) LIQD Place 15 mLs into feeding tube daily. Patient not taking: Reported on 11/08/2018 11/01/18   Kinnie Feil, MD  pantoprazole sodium (PROTONIX) 40 mg/20 mL PACK Place 20 mLs (40 mg total) into feeding tube daily. Patient not taking: Reported on 11/08/2018 11/01/18   Kinnie Feil, MD  rosuvastatin (CRESTOR) 10 MG tablet Place 1 tablet (10 mg total) into feeding tube every evening. Patient not taking: Reported on 11/08/2018 11/01/18   Kinnie Feil, MD     Critical care time: 40 min.    [name], [place] [facility] Pager: [phone]  or [phone] 11/08/2018, 8:54 PM

## 2018-11-08 NOTE — Progress Notes (Signed)
ELink MD called RN back about patients BP. Stated he will have someone come put in a Central line. Per MD, run NEO through her HD cath until line is in place     [employer]

## 2018-11-08 NOTE — Consult Note (Addendum)
[facility] Surgery Consult Note  KRIMSON MASSMANN 1961/01/28  [id number].    Requesting MD: Gerlene Fee Chief Complaint/Reason for Consult: acute cholecystitis  HPI:  Catherine Williams is a 57yo female with multiple medical problems including DM, COPD, CHF, ESRD, VDRF s/p trach, and multiple recent cardiac arrests, who was transferred from [facility] to the [facility] with concerns for acute cholecystitis. Patient's daughter at bedside who helped with history taking. States that she has been having progressively worsening right sided abdominal pain for 1-2 weeks. Pain is constant, worse with palpation or if she lies on her right side. She has had nausea, vomiting, and diarrhea. No fever or chills. Never had pain like this before. CT scan obtained at [facility] that showed acute cholecystitis. She was started on IV cipro. Labs in the ED revealed WBC 15, AST 115, ALT 101, alk phos 354, bilirubin 0.9. General surgery asked to see.  ROS: Review of Systems  Constitutional: Negative.   HENT: Negative.   Eyes: Negative.   Respiratory: Positive for sputum production.   Cardiovascular: Positive for leg swelling.  Gastrointestinal: Positive for abdominal pain, diarrhea, nausea and vomiting.  Genitourinary: Negative.   Musculoskeletal: Negative.   Skin: Negative.   Neurological: Negative.    All systems reviewed and otherwise negative except for as above  Family History  Problem Relation Age of Onset  . Lung cancer Mother   . Clotting disorder Mother   . Heart disease Mother   . Cancer Mother        Lung  . Deep vein thrombosis Mother   . Diabetes Mother   . Hypertension Mother   . Varicose Veins Mother   . Cancer Father   . Clotting disorder Sister   . Heart attack Sister   . Diabetes Sister   . Clotting disorder Brother   . Deep vein thrombosis Brother   . Diabetes Brother   . Heart disease Brother   . Heart disease Sister     Past Medical History:  Diagnosis Date   . Acute on chronic respiratory failure with hypoxia ([place])   . Anemia   . Arthritis Dec. 2014   Gout  . Asthma   . Cardiac arrest ([place])   . CHF (congestive heart failure) ([place])   . Chronic diastolic heart failure of unknown etiology ([place])   . COPD (chronic obstructive pulmonary disease) ([place])   . Diabetes mellitus   . End stage renal disease on dialysis ([place])   . GERD (gastroesophageal reflux disease)   . Healthcare-associated pneumonia   . Heart failure   . Hyperlipidemia   . Hypertension   . Increased tracheal secretions   . Kidney disease   . Morbid obesity ([place])   . Peripheral vascular disease ([place])   . Pinched nerve   . Requires supplemental oxygen    as needed  . Sleep apnea    wears cpap  . Wears dentures     Past Surgical History:  Procedure Laterality Date  . Atherectomy and angioplasty  10/18/2011   left posterior tibial artery  . AV FISTULA PLACEMENT Left 10/04/2018   Procedure: ARTERIOVENOUS (AV) FISTULA CREATION LEFT ARM;  Surgeon: Serafina Mitchell, MD;  Location: [place];  Service: Vascular;  Laterality: Left;  . COLONOSCOPY    . HAND SURGERY     right  . IR FLUORO GUIDE CV LINE LEFT  10/26/2018  . IR US GUIDE VASC ACCESS LEFT  10/26/2018  . LEFT HEART CATH AND CORONARY  ANGIOGRAPHY N/A 10/19/2018   Procedure: LEFT HEART CATH AND CORONARY ANGIOGRAPHY;  Surgeon: Leonie Man, MD;  Location: [facility];  Service: Cardiovascular;  Laterality: N/A;  . MULTIPLE TOOTH EXTRACTIONS    . OVARY SURGERY     Left  . TOE AMPUTATION  Sept. 25,2012   Left 4th and 5th toes    Social History:  reports that she has never smoked. She has never used smokeless tobacco. She reports that she has current or past drug history. Drug: Marijuana. She reports that she does not drink alcohol.  Allergies:  Allergies  Allergen Reactions  . Omnipaque [Iohexol] Nausea And Vomiting    Contrast Dye- Effects Kidney's     (Not in a hospital admission)  Prior to Admission  medications   Medication Sig Start Date End Date Taking? Authorizing Provider  acetaminophen (TYLENOL) 325 MG tablet Take 650 mg by mouth every 6 (six) hours as needed.   Yes [provider]  atorvastatin (LIPITOR) 10 MG tablet Take 10 mg by mouth daily.   Yes [provider]  ciprofloxacin (CIPRO IN D5W) 400 MG/200ML SOLN Inject 400 mg into the vein every 12 (twelve) hours.   Yes [provider]  dextrose 50 % solution Inject 50 mLs into the vein as needed for low blood sugar.   Yes [provider]  Dextrose-Sodium Chloride (DEXTROSE 5 % AND 0.45% NACL) infusion Inject 1,000 mLs into the vein as needed.   Yes [provider]  diphenhydrAMINE 25 mg in sodium chloride 0.9 % 50 mL Inject 25 mg into the vein every 6 (six) hours as needed (Itching).   Yes [provider]  famotidine (PEPCID) 20 MG tablet Take 20 mg by mouth 2 (two) times daily.   Yes [provider]  Heparin Sodium, Porcine, PF 5000 UNIT/ML SOLN Inject 5,000 Units as directed 2 (two) times daily.   Yes [provider]  HYDROcodone-acetaminophen (NORCO/VICODIN) 5-325 MG tablet Take 1 tablet by mouth every 6 (six) hours as needed for moderate pain.   Yes [provider]  insulin glargine (LANTUS) 100 UNIT/ML injection Inject 0.3 mLs (30 Units total) into the skin daily. 11/01/18  Yes Buriev, Arie Sabina, MD  insulin lispro (HUMALOG) 100 UNIT/ML injection Inject 2-10 Units into the skin every 6 (six) hours. Sliding Scale\\   From 70-150 = 0 units           151-200= 2 units           201-250 = 4 units           251-300 = 6 units           301-350 = 8 units            351-400 = 10 units For Blood Glucose above 400, give 12 units CALL MD and obtain stat lab verification   Yes [provider]  ipratropium-albuterol (DUONEB) 0.5-2.5 (3) MG/3ML SOLN Take 3 mLs by nebulization 3 (three) times daily. Patient taking differently: Take 3 mLs by nebulization  as needed.  11/01/18  Yes Buriev, Arie Sabina, MD  magnesium oxide (MAG-OX) 400 MG tablet Take 400 mg by mouth as needed.   Yes [provider]  Magnesium Sulfate 1000 MG/1.6ML SOLN Inject 1 g into the vein as needed. In 100 Ml Drip rate 200 ml/h1   Yes [provider]  metronidazole (FLAGYL) 500-0.74 MG/100ML-% Inject 500 mg into the vein every 8 (eight) hours. NaCl 0.79% Drip Rate :  100 ml/hr Give after dialysis and dialysis days   Yes [provider]  midodrine (PROAMATINE) 2.5 MG tablet Take 1 tablet (2.5 mg total) by mouth 3 (three) times daily with meals. 11/01/18  Yes Buriev, Arie Sabina, MD  morphine 2 MG/ML injection Inject 2 mg into the vein every 8 (eight) hours as needed.   Yes [provider]  Multiple Vitamins-Minerals (MULTIVITAMINS THER. W/MINERALS) TABS tablet Take 1 tablet by mouth daily.   Yes [provider]  Nutritional Supplements (FEEDING SUPPLEMENT, VITAL HIGH PROTEIN,) LIQD liquid Place 1,000 mLs into feeding tube continuous. 11/01/18  Yes Buriev, Arie Sabina, MD  ondansetron [facility]) 4 MG/5ML solution Take 4 mg by mouth every 8 (eight) hours as needed for nausea or vomiting.   Yes [provider]  polyethylene glycol (MIRALAX / GLYCOLAX) packet Take 17 g by mouth daily.   Yes [provider]  Potassium Bicarb & Chloride (EFFERVESCENT POT CHLORIDE PO) Take 80 mEq by mouth as needed.   Yes [provider]  potassium chloride 10 MEQ/100ML Inject 10 mEq into the vein as needed (Drip Rate 4.167).   Yes [provider]  sodium polystyrene (KAYEXALATE) 15 GM/60ML suspension Take 15-30 g by mouth as needed.   Yes [provider]  acetaminophen (TYLENOL) 160 MG/5ML solution Place 20.3 mLs (650 mg total) into feeding tube every 6 (six) hours as needed for mild pain or fever. Patient not taking: Reported on 11/08/2018 11/01/18   Kinnie Feil, MD  allopurinol (ZYLOPRIM) 100 MG tablet Place 1  tablet (100 mg total) into feeding tube at bedtime. Patient not taking: Reported on 11/08/2018 11/01/18   Kinnie Feil, MD  Amino Acids-Protein Hydrolys (FEEDING SUPPLEMENT, PRO-STAT SUGAR FREE 64,) LIQD Place 30 mLs into feeding tube 3 (three) times daily. Patient not taking: Reported on 11/08/2018 11/01/18   Kinnie Feil, MD  aspirin 81 MG tablet Place 1 tablet (81 mg total) into feeding tube daily. Patient not taking: Reported on 11/08/2018 11/01/18   Kinnie Feil, MD  chlorhexidine gluconate, MEDLINE KIT, (PERIDEX) 0.12 % solution 15 mLs by Mouth Rinse route 2 (two) times daily. Patient not taking: Reported on 11/08/2018 11/01/18   Kinnie Feil, MD  insulin aspart (NOVOLOG) 100 UNIT/ML injection Inject 0-20 Units into the skin every 4 (four) hours. Insulin sliding scale Patient not taking: Reported on 11/08/2018 11/01/18   Kinnie Feil, MD  Multiple Vitamin (MULTIVITAMIN) LIQD Place 15 mLs into feeding tube daily. Patient not taking: Reported on 11/08/2018 11/01/18   Kinnie Feil, MD  pantoprazole sodium (PROTONIX) 40 mg/20 mL PACK Place 20 mLs (40 mg total) into feeding tube daily. Patient not taking: Reported on 11/08/2018 11/01/18   Kinnie Feil, MD  rosuvastatin (CRESTOR) 10 MG tablet Place 1 tablet (10 mg total) into feeding tube every evening. Patient not taking: Reported on 11/08/2018 11/01/18   Kinnie Feil, MD    Blood pressure (!) 106/55, pulse 80, temperature 99.9 F (37.7 C), temperature source Oral, resp. rate 20, SpO2 100 %. Physical Exam: General: pleasant, overweight AA female who is laying in bed in NAD HEENT: head is normocephalic, atraumatic.  Sclera are noninjected.  Pupils equal and round.  Ears and nose without any masses or lesions.  Mouth is pink and moist. Dentition poor. Trach in place Heart: regular, rate, and rhythm.  No obvious murmurs, gallops, or rubs noted.  Palpable pedal pulses bilaterally Lungs: diffuse rhonchi and  few wheezes bilaterally.  Respiratory  effort nonlabored, on vent Abd: obese, soft, mild distension, +BS, no masses, hernias, or organomegaly. TTP RUQ and RLQ with some voluntary guarding, no peritonitis MS: 1-2+ BLE edema Skin: warm and dry with no masses, lesions, or rashes   Results for orders placed or performed during the hospital encounter of 11/01/18 (from the past 48 hour(s))  Culture, blood (routine x 2)     Status: None (Preliminary result)   Collection Time: 11/07/18  9:27 AM  Result Value Ref Range   Specimen Description BLOOD RIGHT HAND    Special Requests      BOTTLES DRAWN AEROBIC ONLY Blood Culture results may not be optimal due to an inadequate volume of blood received in culture bottles   Culture      NO GROWTH 1 DAY Performed at [facility], [place] [address] [place], [place] [address]    Report Status PENDING   Culture, blood (routine x 2)     Status: None (Preliminary result)   Collection Time: 11/07/18 10:16 AM  Result Value Ref Range   Specimen Description BLOOD RIGHT HAND    Special Requests      BOTTLES DRAWN AEROBIC AND ANAEROBIC Blood Culture adequate volume   Culture      NO GROWTH 1 DAY Performed at [facility], [place] [address] [place], [place] [address]    Report Status PENDING   CBC with Differential/Platelet     Status: Abnormal   Collection Time: 11/08/18 11:21 AM  Result Value Ref Range   WBC 15.0 (H) 4.0 - 10.5 K/uL   RBC 2.92 (L) 3.87 - 5.11 MIL/uL   Hemoglobin 7.4 (L) 12.0 - 15.0 g/dL   HCT 25.9 (L) 36.0 - 46.0 %   MCV 88.7 80.0 - 100.0 fL   MCH 25.3 (L) 26.0 - 34.0 pg   MCHC 28.6 (L) 30.0 - 36.0 g/dL   RDW 16.6 (H) 11.5 - 15.5 %   Platelets 259 150 - 400 K/uL   nRBC 0.7 (H) 0.0 - 0.2 %   Neutrophils Relative % 76 %   Neutro Abs 11.3 (H) 1.7 - 7.7 K/uL   Lymphocytes Relative 11 %   Lymphs Abs 1.6 0.7 - 4.0 K/uL   Monocytes Relative 7 %   Monocytes Absolute 1.1 (H) 0.1 - 1.0 K/uL   Eosinophils Relative 1 %   Eosinophils  Absolute 0.2 0.0 - 0.5 K/uL   Basophils Relative 0 %   Basophils Absolute 0.1 0.0 - 0.1 K/uL   Immature Granulocytes 5 %   Abs Immature Granulocytes 0.75 (H) 0.00 - 0.07 K/uL    Comment: Performed at [facility], [place] [address] [place], [place] [address]  Comprehensive metabolic panel     Status: Abnormal   Collection Time: 11/08/18 11:21 AM  Result Value Ref Range   Sodium 134 (L) 135 - 145 mmol/L   Potassium 4.8 3.5 - 5.1 mmol/L   Chloride 96 (L) 98 - 111 mmol/L   CO2 24 22 - 32 mmol/L   Glucose, Bld 168 (H) 70 - 99 mg/dL   BUN 47 (H) 6 - 20 mg/dL   Creatinine, Ser 4.83 (H) 0.44 - 1.00 mg/dL   Calcium 9.6 8.9 - 10.3 mg/dL   Total Protein 7.4 6.5 - 8.1 g/dL   Albumin 1.6 (L) 3.5 - 5.0 g/dL   AST 115 (H) 15 - 41 U/L   ALT 101 (H) 0 - 44 U/L   Alkaline Phosphatase 354 (H) 38 -  126 U/L   Total Bilirubin 0.9 0.3 - 1.2 mg/dL   GFR calc non Af Amer 9 (L) >60 mL/min   GFR calc Af Amer 10 (L) >60 mL/min    Comment: (NOTE) The eGFR has been calculated using the CKD EPI equation. This calculation has not been validated in all clinical situations. eGFR's persistently <60 mL/min signify possible Chronic Kidney Disease.    Anion gap 14 5 - 15    Comment: Performed at [facility] [address], [place], [place] [address]  Amylase     Status: None   Collection Time: 11/08/18 11:21 AM  Result Value Ref Range   Amylase 44 28 - 100 U/L    Comment: Performed at [facility] [address] [place], [place] [address]  Lipase, blood     Status: None   Collection Time: 11/08/18 11:21 AM  Result Value Ref Range   Lipase 23 11 - 51 U/L    Comment: Performed at [facility] [address], [place], [place] [address]   Ct Abdomen Pelvis Wo Contrast  Result Date: 11/08/2018 CLINICAL DATA:  Unspecified abdominal pain. EXAM: CT ABDOMEN AND PELVIS WITHOUT CONTRAST TECHNIQUE: Multidetector CT imaging of the abdomen and pelvis was performed following the standard  protocol without IV contrast. COMPARISON:  CT pelvis 10/30/2018 FINDINGS: Lower chest: Consolidation or atelectasis in the right lung base. Small right pleural effusion. Cardiac enlargement. Coronary artery calcifications. Hepatobiliary: Multiple stones in the gallbladder. Gallbladder wall thickening with stranding in the pericholecystic fat. Changes likely to represent cholecystitis. No bile duct dilatation. No focal liver lesions. Pancreas: Unremarkable. No pancreatic ductal dilatation or surrounding inflammatory changes. Spleen: Normal in size without focal abnormality. Adrenals/Urinary Tract: No adrenal gland nodules. Multiple renal cysts on the right. No hydronephrosis or hydroureter. Bladder wall is not thickened. Stomach/Bowel: Stomach, small bowel, and colon are not abnormally distended. Enteric tube with tip in the stomach. Stool fills the colon. Vascular/Lymphatic: Prominent mesenteric and celiac axis vascular calcifications. No aneurysm. Reproductive: Uterus and ovaries are not enlarged. Gonadal vein phleboliths. Other: No free air or free fluid in the abdomen. Abdominal wall musculature is lax without any discrete hernia. Musculoskeletal: Degenerative changes in the spine. No destructive bone lesions. IMPRESSION: 1. Cholelithiasis with inflammatory changes in the gallbladder consistent with acute cholecystitis. 2. Consolidation or atelectasis in the right lung base with small right pleural effusion. Aortic Atherosclerosis (ICD10-I70.0). Electronically Signed   By: Lucienne Capers M.D.   On: 11/08/2018 01:32   Dg Abd Portable 1v  Result Date: 11/07/2018 CLINICAL DATA:  57 y/o  F; feeding tube position. EXAM: PORTABLE ABDOMEN - 1 VIEW COMPARISON:  11/04/2018 abdomen radiographs. FINDINGS: Enteric tube tip projects over the proximal gastric body. Mild gastric distention. Moderate volume of stool in the colon. Bones are unremarkable. IMPRESSION: Enteric tube tip projects over the proximal gastric body.  Electronically Signed   By: Kristine Garbe M.D.   On: 11/07/2018 06:17      Assessment/Plan DM COPD CHF - EF 60-65% from ECHO 10/08/18 ESRD - HD MWF Anemia of chronic disease Morbidly obese VDRF s/p trach Multiple recent cardiac arrests  Acute cholecystitis - Patient with acute cholecystitis noted on CT scan and elevated transaminases and alk phos with a normal bilirubin. Due to her multiple medical issues and multiple recent cardiac arrests within the last month would not recommend any surgical intervention. Will ask IR to place a percutaneous cholecystostomy tube. Recommend medical admission. We will follow.  Wellington Hampshire, [facility] Surgery 11/08/2018, 3:09 PM Pager: [phone] Mon 7:00 am -11:30 AM Tues-Fri 7:00 am-4:30 pm Sat-Sun 7:00 am-11:30 am

## 2018-11-08 NOTE — ED Triage Notes (Signed)
Pt arrives from [facility] with reports of an abnormal CT scan. Pt has been there since the 11/14 following a cardiac arrest and is now on trach vent. Pt reports having abd pain since last week and a CT scan was done today. Staff reports pt has temp of 101.2 today. CT results show acute cholecystitis.

## 2018-11-08 NOTE — Telephone Encounter (Signed)
Referring back to original phone encounter that had been closed.  We received a phone call 10/25/18 from pt's daughter Catherine Williams (daughter's name had been misspelled in the contact information and also in this encounter. Spelling was originally [name]). I corrected the spelling of her name.  Case #: [id number] ISN for FMLA claim for [facility]  Per Catherine Williams, the FMLA paperwork was supposed to have been received yesterday, 11/07/18. While talking with Catherine Williams, we did a conference call with Bebe Liter in regards to the [facility] paperwork.   Called [facility] and spoke with Katharine Look stating to her that we have been waiting for paperwork to be sent to our office for pt's daughter Catherine Williams so we can have RA fill out for her. I stated to [facility] that when [facility] paperwork is needed to be done, paperwork is usually sent to [facility] and then after it is sent there to be viewed, it is then sent to our office for the MD to fill out their necessary parts. The phone number for [employer] is [phone] and fax number is [phone].  Katharine Look stated she was going to have pt's paperwork faxed to our office so RA can be able to fill out for pt's daughter Catherine Williams. I stated to Katharine Look to make this urgent so we can get it taken care of as soon as possible.  I stated to Catherine Williams that we would call her with an update as soon as we could. Catherine Williams expressed understanding. Will leave encounter open until all has been taken care of for her.

## 2018-11-08 NOTE — ED Notes (Signed)
RN to bedside to change IV dressing related to patient stating it is itching. IV noted to be infiltrated with some swelling. IV removed, catheter intact, dressing states it was placed 10/29/18. IV restarted to right hand. Patient tolerated well.

## 2018-11-08 NOTE — Progress Notes (Addendum)
  I was called by EDP to admit patient for acute cholecystitis  Surgical consult noted,  [facility] with [facility] pain and CT findings of acute cholecystitis  Surgery plans to get interventional radiology to place drainage tube  She is a status post recent cardiac arrest with many comorbid conditions and she is vent dependent  I placed admission orders to admit patient to stepdown and requested that PCCM and RT be consulted to help manage her ventilator settings  Upon arriving the ED to evaluate patient... RN Burman Nieves... Informed me that patient placement is requesting admission to ICU  rather than stepdown  Apparently the Rules for admitting Vent patients was changed recently and Now requires ICU (and NOT Stepdown) admission  I do NOT have ICU privileges at [facility], I called and Discussed with Dr Dyann Ruddle (PCCM) who states that he will accept pt to PCCM service and Dr Doyne Keel will place admission orders  Hospitalist service will sing off at this time   Roxan Hockey, MD

## 2018-11-08 NOTE — Progress Notes (Signed)
PCCM Interval Progress Note  Discussed with [facility] admissions / flow manager.  Pt will be transferred to [facility] service starting 0700 11/09/18.    PCCM will follow for vent management.   [name], [facility] Pager: [phone]  or [phone] 11/08/2018, 9:39 PM

## 2018-11-09 ENCOUNTER — Inpatient Hospital Stay (HOSPITAL_COMMUNITY): Payer: Medicare Other

## 2018-11-09 ENCOUNTER — Inpatient Hospital Stay
Admission: AD | Admit: 2018-11-09 | Discharge: 2018-12-22 | Disposition: A | Discharge status: Skilled Nursing Facility | Payer: Medicare Other | Source: Ambulatory Visit | Attending: Internal Medicine | Admitting: Internal Medicine

## 2018-11-09 ENCOUNTER — Encounter (HOSPITAL_COMMUNITY): Payer: Self-pay | Admitting: Interventional Radiology

## 2018-11-09 ENCOUNTER — Other Ambulatory Visit (HOSPITAL_COMMUNITY): Payer: Self-pay

## 2018-11-09 DIAGNOSIS — N186 End stage renal disease: Secondary | ICD-10-CM

## 2018-11-09 DIAGNOSIS — R059 Cough, unspecified: Secondary | ICD-10-CM

## 2018-11-09 DIAGNOSIS — Z4682 Encounter for fitting and adjustment of non-vascular catheter: Secondary | ICD-10-CM | POA: Diagnosis not present

## 2018-11-09 DIAGNOSIS — I959 Hypotension, unspecified: Secondary | ICD-10-CM | POA: Insufficient documentation

## 2018-11-09 DIAGNOSIS — J9610 Chronic respiratory failure, unspecified whether with hypoxia or hypercapnia: Secondary | ICD-10-CM

## 2018-11-09 DIAGNOSIS — R0602 Shortness of breath: Secondary | ICD-10-CM

## 2018-11-09 DIAGNOSIS — I469 Cardiac arrest, cause unspecified: Secondary | ICD-10-CM | POA: Diagnosis present

## 2018-11-09 DIAGNOSIS — I5033 Acute on chronic diastolic (congestive) heart failure: Secondary | ICD-10-CM | POA: Diagnosis present

## 2018-11-09 DIAGNOSIS — Z4659 Encounter for fitting and adjustment of other gastrointestinal appliance and device: Secondary | ICD-10-CM

## 2018-11-09 DIAGNOSIS — R05 Cough: Secondary | ICD-10-CM

## 2018-11-09 DIAGNOSIS — J189 Pneumonia, unspecified organism: Secondary | ICD-10-CM | POA: Diagnosis present

## 2018-11-09 DIAGNOSIS — Z431 Encounter for attention to gastrostomy: Secondary | ICD-10-CM

## 2018-11-09 DIAGNOSIS — Z452 Encounter for adjustment and management of vascular access device: Secondary | ICD-10-CM | POA: Diagnosis not present

## 2018-11-09 DIAGNOSIS — Z992 Dependence on renal dialysis: Secondary | ICD-10-CM

## 2018-11-09 DIAGNOSIS — N185 Chronic kidney disease, stage 5: Secondary | ICD-10-CM | POA: Diagnosis present

## 2018-11-09 DIAGNOSIS — J9621 Acute and chronic respiratory failure with hypoxia: Secondary | ICD-10-CM | POA: Diagnosis present

## 2018-11-09 DIAGNOSIS — R0989 Other specified symptoms and signs involving the circulatory and respiratory systems: Secondary | ICD-10-CM | POA: Diagnosis not present

## 2018-11-09 DIAGNOSIS — Z95828 Presence of other vascular implants and grafts: Secondary | ICD-10-CM

## 2018-11-09 DIAGNOSIS — R140 Abdominal distension (gaseous): Secondary | ICD-10-CM | POA: Diagnosis not present

## 2018-11-09 DIAGNOSIS — J9600 Acute respiratory failure, unspecified whether with hypoxia or hypercapnia: Secondary | ICD-10-CM

## 2018-11-09 HISTORY — PX: IR PERC CHOLECYSTOSTOMY: IMG2326

## 2018-11-09 LAB — CBC
HCT: 24.4 % — ABNORMAL LOW (ref 36.0–46.0)
HCT: 26 % — ABNORMAL LOW (ref 36.0–46.0)
HEMOGLOBIN: 7.7 g/dL — AB (ref 12.0–15.0)
Hemoglobin: 6.8 g/dL — CL (ref 12.0–15.0)
MCH: 25.1 pg — ABNORMAL LOW (ref 26.0–34.0)
MCH: 26.3 pg (ref 26.0–34.0)
MCHC: 27.9 g/dL — ABNORMAL LOW (ref 30.0–36.0)
MCHC: 29.6 g/dL — AB (ref 30.0–36.0)
MCV: 90 fL (ref 80.0–100.0)
MCV: 88.7 fL (ref 80.0–100.0)
NRBC: 0.5 % — AB (ref 0.0–0.2)
PLATELETS: 311 10*3/uL (ref 150–400)
PLATELETS: 274 10*3/uL (ref 150–400)
RBC: 2.71 MIL/uL — AB (ref 3.87–5.11)
RBC: 2.93 MIL/uL — ABNORMAL LOW (ref 3.87–5.11)
RDW: 16.2 % — ABNORMAL HIGH (ref 11.5–15.5)
RDW: 15.8 % — AB (ref 11.5–15.5)
WBC: 18.3 10*3/uL — ABNORMAL HIGH (ref 4.0–10.5)
WBC: 13.9 10*3/uL — AB (ref 4.0–10.5)
nRBC: 1 % — ABNORMAL HIGH (ref 0.0–0.2)

## 2018-11-09 LAB — GLUCOSE, CAPILLARY
GLUCOSE-CAPILLARY: 137 mg/dL — AB (ref 70–99)
GLUCOSE-CAPILLARY: 172 mg/dL — AB (ref 70–99)
Glucose-Capillary: 125 mg/dL — ABNORMAL HIGH (ref 70–99)
Glucose-Capillary: 165 mg/dL — ABNORMAL HIGH (ref 70–99)

## 2018-11-09 LAB — BASIC METABOLIC PANEL
ANION GAP: 14 (ref 5–15)
Anion gap: 11 (ref 5–15)
BUN: 55 mg/dL — ABNORMAL HIGH (ref 6–20)
BUN: 69 mg/dL — AB (ref 6–20)
CHLORIDE: 99 mmol/L (ref 98–111)
CHLORIDE: 101 mmol/L (ref 98–111)
CO2: 25 mmol/L (ref 22–32)
CO2: 21 mmol/L — ABNORMAL LOW (ref 22–32)
Calcium: 9.6 mg/dL (ref 8.9–10.3)
Calcium: 9.4 mg/dL (ref 8.9–10.3)
Creatinine, Ser: 5.64 mg/dL — ABNORMAL HIGH (ref 0.44–1.00)
Creatinine, Ser: 6.2 mg/dL — ABNORMAL HIGH (ref 0.44–1.00)
GFR calc Af Amer: 8 mL/min — ABNORMAL LOW (ref >60)
GFR calc non Af Amer: 7 mL/min — ABNORMAL LOW (ref >60)
GFR, EST AFRICAN AMERICAN: 9 mL/min — AB (ref >60)
GFR, EST NON AFRICAN AMERICAN: 7 mL/min — AB (ref >60)
GLUCOSE: 127 mg/dL — AB (ref 70–99)
Glucose, Bld: 131 mg/dL — ABNORMAL HIGH (ref 70–99)
POTASSIUM: 4.6 mmol/L (ref 3.5–5.1)
Potassium: 4.6 mmol/L (ref 3.5–5.1)
SODIUM: 136 mmol/L (ref 135–145)
Sodium: 135 mmol/L (ref 135–145)

## 2018-11-09 LAB — RENAL FUNCTION PANEL
ANION GAP: 14 (ref 5–15)
Albumin: 1.5 g/dL — ABNORMAL LOW (ref 3.5–5.0)
BUN: 69 mg/dL — ABNORMAL HIGH (ref 6–20)
CALCIUM: 9.4 mg/dL (ref 8.9–10.3)
CO2: 21 mmol/L — ABNORMAL LOW (ref 22–32)
CREATININE: 6.13 mg/dL — AB (ref 0.44–1.00)
Chloride: 101 mmol/L (ref 98–111)
GFR calc non Af Amer: 7 mL/min — ABNORMAL LOW (ref >60)
GFR, EST AFRICAN AMERICAN: 8 mL/min — AB (ref >60)
Glucose, Bld: 127 mg/dL — ABNORMAL HIGH (ref 70–99)
Phosphorus: 5 mg/dL — ABNORMAL HIGH (ref 2.5–4.6)
Potassium: 4.6 mmol/L (ref 3.5–5.1)
SODIUM: 136 mmol/L (ref 135–145)

## 2018-11-09 LAB — MAGNESIUM: MAGNESIUM: 2.2 mg/dL (ref 1.7–2.4)

## 2018-11-09 LAB — PHOSPHORUS: PHOSPHORUS: 4 mg/dL (ref 2.5–4.6)

## 2018-11-09 LAB — PREPARE RBC (CROSSMATCH)

## 2018-11-09 MED ORDER — IOPAMIDOL (ISOVUE-300) INJECTION 61%
INTRAVENOUS | Status: AC
  Filled 2018-11-09: qty 50

## 2018-11-09 MED ORDER — FENTANYL CITRATE (PF) 100 MCG/2ML IJ SOLN
INTRAMUSCULAR | Status: AC | PRN
  Administered 2018-11-09 (×3): 25 ug via INTRAVENOUS

## 2018-11-09 MED ORDER — SODIUM CHLORIDE 0.9% IV SOLUTION: Freq: Once | INTRAVENOUS | Status: DC

## 2018-11-09 MED ORDER — SODIUM CHLORIDE 0.9% FLUSH: 5.0000 mL | Freq: Three times a day (TID) | INTRAVENOUS | Status: DC

## 2018-11-09 MED ORDER — HEPARIN SODIUM (PORCINE) 1000 UNIT/ML IJ SOLN
2500.0000 [IU] | Freq: Once | INTRAMUSCULAR | Status: AC
  Administered 2018-11-09: 2500 [IU]
  Filled 2018-11-09: qty 3

## 2018-11-09 MED ORDER — DEXTROSE-NACL 5-0.9 % IV SOLN
INTRAVENOUS | Status: DC
  Administered 2018-11-09: 01:00:00 via INTRAVENOUS

## 2018-11-09 MED ORDER — LIDOCAINE HCL (PF) 1 % IJ SOLN
INTRAMUSCULAR | Status: AC | PRN
  Administered 2018-11-09: 5 mL

## 2018-11-09 MED ORDER — FENTANYL CITRATE (PF) 100 MCG/2ML IJ SOLN
INTRAMUSCULAR | Status: AC
  Filled 2018-11-09: qty 2

## 2018-11-09 MED ORDER — MIDAZOLAM HCL 2 MG/2ML IJ SOLN
INTRAMUSCULAR | Status: AC
  Filled 2018-11-09: qty 2

## 2018-11-09 MED ORDER — CEFAZOLIN SODIUM-DEXTROSE 2-4 GM/100ML-% IV SOLN
2.0000 g | INTRAVENOUS | Status: AC
  Administered 2018-11-09: 2 g via INTRAVENOUS
  Filled 2018-11-09: qty 100

## 2018-11-09 MED ORDER — PHENYLEPHRINE HCL-NACL 20-0.9 MG/250ML-% IV SOLN: 0.0000 ug/min | INTRAVENOUS | Status: AC

## 2018-11-09 MED ORDER — MIDAZOLAM HCL 2 MG/2ML IJ SOLN
INTRAMUSCULAR | Status: AC | PRN
  Administered 2018-11-09: 0.5 mg via INTRAVENOUS
  Administered 2018-11-09: 1 mg via INTRAVENOUS

## 2018-11-09 MED ORDER — LIDOCAINE HCL 1 % IJ SOLN
INTRAMUSCULAR | Status: AC
  Filled 2018-11-09: qty 20

## 2018-11-09 NOTE — Sedation Documentation (Signed)
Procedure finished. Will assist in transport to [place] with RT

## 2018-11-09 NOTE — Progress Notes (Signed)
Nutrition Brief Note  Pt known to this RD from previous hospitalization on 2H. She has a Cortrak 10 F tube; placed per Cortrak Tube Team 10/29/18. Spoke with Anderson Malta, RN. Pt is having perc chole placed per IR later today.  Plan is for pt to return to [facility] after procedure. RD to hold off on enteral/tube feeding initiation at this time. Please consult if needed.  Arthur Holms, RD, LDN Pager #: [phone] After-Hours Pager #: [phone]

## 2018-11-09 NOTE — Sedation Documentation (Signed)
Pt resting at this time. Responds to touch at procedure site. RT at beside to manage ventilator. Procedure started

## 2018-11-09 NOTE — Sedation Documentation (Signed)
Patient is resting comfortably. 

## 2018-11-09 NOTE — Progress Notes (Signed)
This RN Called E-Link RN to notify RN/MD of patients BGL. Initial CBG upon arrival was 95, on the 0000 CBG, it dropped to 73. This RN suggested adding Dextrose to her IV fluids since we did not restart her Tube Feeding. CBG to be drawn Q4 or more often to keep a close eye on patients blood glucose levels.   E-Link RN will notify MD, watch for orders.   Catherine Williams

## 2018-11-09 NOTE — Progress Notes (Signed)
[facility] Progress Note Patient Name: Catherine Williams DOB: 26-Aug-1961 MRN: [id number]   Date of Service  11/09/2018  HPI/Events of Note  NPO for IR procedure in AM  eICU Interventions  D5NS  Infuse at 50 ml/ hr        [name] 11/09/2018, 12:35 AM

## 2018-11-09 NOTE — Care Management Note (Addendum)
Case Management Note  Patient Details  Name: Catherine Williams MRN: [id number] Date of Birth: [birth year]  Subjective/Objective:  57 yo female presented with acute cholecystitis.                  Action/Plan: CM consult to assist with LTACH patient acknowledged. Patient sent from [place]; scheduled for percutaneous cholecystostomy drain placement. CM attempted to contacted Dr. Gevena Barre at the number provided with Dr. Maryland Pink unavailable; POC discussed with patient's primary nurse. CM spoke to [employer], Physicist, medical; patient can return back to [facility] once MD feels patient is medically stable for transfer; [facility] liaisons will f/u to coordinate transfer. CM will continue following.   Expected Discharge Date:  11/09/18               Expected Discharge Plan:  Long Term Acute Care (LTAC)  In-House Referral:  NA  Discharge planning Services  CM Consult  Post Acute Care Choice:  Resumption of Svcs/PTA Provider Choice offered to:  NA  DME Arranged:  N/A DME Agency:  NA  HH Arranged:  NA HH Agency:  NA  Status of Service:  Completed, signed off  If discussed at [place] of Stay Meetings, dates discussed:    Additional Comments: 11/09/18 @ 1519-Lesle Faron RNCM-CM informed Dr. Maryland Pink via Epic secure chat [facility] is able to accept patient back today, DC summary and med reconciliation needed. Dr. Maryland Pink agreeable to transfer. CM updated Corrina, Physicist, medical and primary nurse on POC. No further needs from CM.  Midge Minium RN, BSN, NCM-BC, ACM-RN [phone] 11/09/2018, 11:10 AM

## 2018-11-09 NOTE — Progress Notes (Signed)
This RN called E-Link for order clarification. Patient admitted with acute cholecystitis, this RN questioned holding tube feeds and meds down her tube due to her diagnosis.   E-Link RN will relay message to MD, but told this RN to hold Tube feeds and meds until they could further review the patient's orders.      Charlsie Quest

## 2018-11-09 NOTE — Progress Notes (Signed)
This RN called E-Link to notify physician of the patients SBP and MAP being extremely low(SBP 80s, MAPs in the 40s). This RN told E-Link she started the patients fluids at a faster rate (257mL/hr, in attempt not to overload her) to try to increase patients BP until orders were given.    MD will follow up with orders, in the meantime, crush the Midodrine and give it down the patients NG tube.    Will continue to monitor.  Catherine Williams

## 2018-11-09 NOTE — Progress Notes (Signed)
[facility] Progress Note     Subjective: CC-  Patient comfortable this morning. Continues to complain of right sided abdominal pain. Denies n/v. She is on rocephin/flagyl. WBC trending down 18.3, afebrile. Hg 6.8 this AM from 7.4, getting 1 unit PRBCs.   Objective: Vital signs in last 24 hours: Temp:  [98.2 F (36.8 C)-99.9 F (37.7 C)] 98.2 F (36.8 C) (11/22 0726) Pulse Rate:  [71-88] 76 (11/22 0730) Resp:  [9-36] 34 (11/21 2257) BP: (81-125)/(19-89) 123/26 (11/22 0730) SpO2:  [95 %-100 %] 100 % (11/22 0730) FiO2 (%):  [28 %-30 %] 30 % (11/22 0357) Last BM Date: (at [facility], [facility])  Intake/Output from previous day: 11/21 0701 - 11/22 0700 In: 1262.6 [I.V.:863.6; NG/GT:130; IV Piggyback:269.1] Out: 0  Intake/Output this shift: No intake/output data recorded.  PE: Gen:  Alert, NAD, pleasant HEENT: EOM's intact, pupils equal and round. Trach in place Card:  RRR Pulm:  CTAB, no W/R/R, effort normal Abd: obese, soft, mild distension, mild TTP RUQ/RLQ and epigastric region without rebound or guarding, +BS, no HSM Ext:  1+ edema BLE Skin: no rashes noted, warm and dry  Lab Results:  Recent Labs    11/08/18 1121 11/09/18 0409  WBC 15.0* 18.3*  HGB 7.4* 6.8*  HCT 25.9* 24.4*  PLT 259 311   BMET Recent Labs    11/08/18 1121 11/09/18 0409  NA 134* 135  K 4.8 4.6  CL 96* 99  CO2 24 25  GLUCOSE 168* 131*  BUN 47* 55*  CREATININE 4.83* 5.64*  CALCIUM 9.6 9.6   PT/INR Recent Labs    11/08/18 1546  LABPROT 15.0  INR 1.19   CMP     Component Value Date/Time   NA 135 11/09/2018 0409   K 4.6 11/09/2018 0409   CL 99 11/09/2018 0409   CO2 25 11/09/2018 0409   GLUCOSE 131 (H) 11/09/2018 0409   BUN 55 (H) 11/09/2018 0409   CREATININE 5.64 (H) 11/09/2018 0409   CALCIUM 9.6 11/09/2018 0409   CALCIUM 9.7 11/05/2018 0647   PROT 7.4 11/08/2018 1121   ALBUMIN 1.6 (L) 11/08/2018 1121   AST 115 (H) 11/08/2018 1121   ALT 101 (H) 11/08/2018 1121    ALKPHOS 354 (H) 11/08/2018 1121   BILITOT 0.9 11/08/2018 1121   GFRNONAA 7 (L) 11/09/2018 0409   GFRAA 9 (L) 11/09/2018 0409   Lipase     Component Value Date/Time   LIPASE 23 11/08/2018 1121       Studies/Results: Ct Abdomen Pelvis Wo Contrast  Result Date: 11/08/2018 CLINICAL DATA:  Unspecified abdominal pain. EXAM: CT ABDOMEN AND PELVIS WITHOUT CONTRAST TECHNIQUE: Multidetector CT imaging of the abdomen and pelvis was performed following the standard protocol without IV contrast. COMPARISON:  CT pelvis 10/30/2018 FINDINGS: Lower chest: Consolidation or atelectasis in the right lung base. Small right pleural effusion. Cardiac enlargement. Coronary artery calcifications. Hepatobiliary: Multiple stones in the gallbladder. Gallbladder wall thickening with stranding in the pericholecystic fat. Changes likely to represent cholecystitis. No bile duct dilatation. No focal liver lesions. Pancreas: Unremarkable. No pancreatic ductal dilatation or surrounding inflammatory changes. Spleen: Normal in size without focal abnormality. Adrenals/Urinary Tract: No adrenal gland nodules. Multiple renal cysts on the right. No hydronephrosis or hydroureter. Bladder wall is not thickened. Stomach/Bowel: Stomach, small bowel, and colon are not abnormally distended. Enteric tube with tip in the stomach. Stool fills the colon. Vascular/Lymphatic: Prominent mesenteric and celiac axis vascular calcifications. No aneurysm. Reproductive: Uterus and ovaries are not enlarged. Gonadal vein  phleboliths. Other: No free air or free fluid in the abdomen. Abdominal wall musculature is lax without any discrete hernia. Musculoskeletal: Degenerative changes in the spine. No destructive bone lesions. IMPRESSION: 1. Cholelithiasis with inflammatory changes in the gallbladder consistent with acute cholecystitis. 2. Consolidation or atelectasis in the right lung base with small right pleural effusion. Aortic Atherosclerosis (ICD10-I70.0).  Electronically Signed   By: Lucienne Capers M.D.   On: 11/08/2018 01:32   US Abdomen Limited Ruq  Result Date: 11/08/2018 CLINICAL DATA:  Right upper quadrant pain EXAM: ULTRASOUND ABDOMEN LIMITED RIGHT UPPER QUADRANT COMPARISON:  11/08/2018 CT FINDINGS: Gallbladder: Distended gallbladder with dependent calculi. Mural thickening up to 6 mm. Positive sonographic Murphy's is noted. The largest calcification measures 4.6 mm. There is biliary sludge admixed with the stones. Common bile duct: Diameter: 4.3 mm and normal without choledocholithiasis. Liver: Suboptimal assessment of the liver due to patient inability to move appropriately for optimal imaging. No definite space-occupying mass. Portal vein is patent on color Doppler imaging with normal direction of blood flow towards the liver. IMPRESSION: Findings suggest acute cholecystitis with biliary sludge and mobile calculi noted. Positive sonographic Murphy's with gallbladder distention and mural thickening of the gallbladder wall to 6 mm in thickness is noted. Electronically Signed   By: Ashley Royalty M.D.   On: 11/08/2018 19:24    Anti-infectives: Anti-infectives (From admission, onward)   Start     Dose/Rate Route Frequency Ordered Stop   11/08/18 2200  ciprofloxacin (CIPRO) IVPB 400 mg  Status:  Discontinued     400 mg 200 mL/hr over 60 Minutes Intravenous Every 12 hours 11/08/18 1935 11/08/18 1956   11/08/18 2200  metronidazole (FLAGYL) IVPB 500 mg    Note to Pharmacy:  NaCl 0.79% Drip Rate : 100 ml/hr Give after dialysis and dialysis days     500 mg 100 mL/hr over 60 Minutes Intravenous Every 8 hours 11/08/18 1935     11/08/18 1600  cefTRIAXone (ROCEPHIN) 2 g in sodium chloride 0.9 % 100 mL IVPB     2 g 200 mL/hr over 30 Minutes Intravenous Every 24 hours 11/08/18 1523         Assessment/Plan DM COPD CHF - EF 60-65% from ECHO 10/08/18 ESRD - HD MWF Anemia of chronic disease Morbidly obese VDRF s/p trach Multiple recent cardiac  arrests  Acute cholecystitis - CT abd/pelvis and u/s reveal acute cholecystitis with biliary sludge and stones, positive sonographic Murphy sign - patient is not a surgical candidate, going for percutaneous cholecystostomy tube in IR today. Continue IV rocephin.  ID - rocephin/flagyl 11/21>> FEN - IVF, NPO for procedure Foley - none DVT - SCDs, chemical DVT prophylaxis held for procedure   LOS: 1 day    Wellington Hampshire , [facility] 11/09/2018, 7:59 AM Pager: [phone] Mon 7:00 am -11:30 AM Tues-Fri 7:00 am-4:30 pm Sat-Sun 7:00 am-11:30 am,g

## 2018-11-09 NOTE — Progress Notes (Signed)
[facility] Progress Note Patient Name: INDIANA GAMERO DOB: 1961-01-04 MRN: [id number]   Date of Service  11/09/2018  HPI/Events of Note  Hypotension, Hemoglobin 6.8 gm this AM  eICU Interventions  Midodrine increased to 10 mg po tid, Phenylephrine infusion started, transfuse one unit PRBC, needs central line catheter        Kerry Kass Rooney Swails 11/09/2018, 6:39 AM

## 2018-11-09 NOTE — H&P (Addendum)
NAME:  Catherine Williams, MRN:  [id number], DOB:  17-Apr-1961, LOS: 0 ADMISSION DATE:  11/09/2018, CONSULTATION DATE:  11/08/2018 REFERRING MD:  Dr. Sedonia Small, CHIEF COMPLAINT:  VDRF  Brief History   28 yoF VDRF from [facility] admitted with acute cholecystitis.   History of present illness   57 year old with PMH significant for but not limited to VDRF s/p trach, COPD, diastolic HF, DM, ESRD (MWF), morbidly obese, anemia of chronic disease, and recent multiple cardiac arrest (Etiology not completely clear but felt related to mucous plugging, had cardiac cath on prior admit that was non-diagnostic) presenting from [facility] 11/08/18 for progressive worsening of right abdominal pain for 1-2 weeks with associated nausea, vomiting, and diarrhea.  No reports of fever.  CT abd showed acute cholecystitis.  Patient hemodynamically stable.  Surgery consulted and felt to be too high risk for surgery with recommendations for percutaneous drain by IR.   Past Medical History  VDRF s/p trach, COPD, diastolic HF, DM, ESRD (MWF), morbidly obese, anemia of chronic disease, multiple cardiac arrest, OSA  Significant Hospital Events   11/21 Admitted to ICU  11/22 Transferred back to hospitalist Service, discussed with Dr. [place] Pink  Consults:  11/21 [place] 11/21 PCCM -vent management 11/21 IR   Procedures:   Significant Diagnostic Tests:  11/21 CT abd/pelvis > Cholelithiasis with inflammatory changes in the gallbladder consistent with acute cholecystitis. Consolidation or atelectasis in the right lung base with small right pleural effusion.  Micro Data:  None  Antimicrobials:  Cipro 11/21 > 11/21. Ceftriaxone 11/21 >  Flagyl 11/21 >  Interim history/subjective:  Sleepy but arouses easily to voice.  Objective   There were no vitals taken for this visit.    Vent Mode: PRVC FiO2 (%):  [30 %-100 %] 100 % Set Rate:  [20 bmp] 20 bmp Vt Set:  [400 mL] 400 mL PEEP:  [5 cmH20] 5 cmH20 Plateau  Pressure:  [18 cmH20-26 cmH20] 24 cmH20  No intake or output data in the 24 hours ending 11/09/18 1855 There were no vitals filed for this visit.  Examination: General: Trached, asking to call her daughter, writing questions.  Neuro:Awake and alert  HEENT: [place]/AT. Trach C/D/I. Cardiovascular: RRR, no M/R/G.  Lungs: Respirations even and unlabored.  CTA bilaterally,  Abdomen: Obese, BS x 4, soft.  Mild tenderness RUQ and epigastrium.  Musculoskeletal: No gross deformities, no edema.  Skin: Intact, warm, no rashes.   Assessment & Plan:   Acute cholecystitis. Plan for perc cholecystostomy by IR today at 230.   I d/ced ASA and heparin sq prior to procedure, this will need to be restarted after perc tube placed.    Chronic respiratory failure/ VDRF s/p trach (10/19/2018). - Continue full MV support- PRVC TV 400/ rate 20/ PEEP 5/ 28% FIO2. Per notes PSV attempted this week but did not tolerate.  I would not wean while she is acutely ill.   - VAP protocol.  - Trach care per protocol.  COPD without acute exacerbation. - Continue DuoNebs / Albuterol.   Acute on chronic renal failure- now dialysis dependent (since October 2019). Will transfer back to [facility] and can continue regularly scheduled dialysis there.   Anemia of chronic disease.\ Receiving PRBCs now.   - Transfuse for Hgb < 7.  Hx hypotension (on midodrine), HLD, CHF, multiple cardiac arrests. She has not received her usual PO meds as IR prefers her completely NPO.  No midodrine today, which is likely why she is requiring phenylephrine.  Will restart after procedure. Will d/c hydrocortisone as her pressor requirements are low and this is an acute on chronic problem.  Please call if needing to titrate up more on phenylephrine so we can consider placing central line.  Currently suspect pressors will only be needed for short time, being given through a large IV (18).  Monitor closely.     Hx DM. - SSI.  No vent needs at this  time, ok to continue current settings, which are serving her well for now.    Labs   CBC: Recent Labs  Lab 11/05/18 0647 11/06/18 0502 11/08/18 1121 11/09/18 0409  WBC 26.0* 25.9* 15.0* 18.3*  NEUTROABS  --   --  11.3*  --   HGB 7.6* 7.7* 7.4* 6.8*  HCT 26.4* 27.0* 25.9* 24.4*  MCV 87.7 89.7 88.7 90.0  PLT 284 263 259 665    Basic Metabolic Panel: Recent Labs  Lab 11/05/18 0647 11/08/18 1121 11/09/18 0409  NA 132* 134* 135  K 4.2 4.8 4.6  CL 94* 96* 99  CO2 25 24 25   GLUCOSE 136* 168* 131*  BUN 74* 47* 55*  CREATININE 7.00* 4.83* 5.64*  CALCIUM 9.6  9.7 9.6 9.6  MG  --   --  2.2  PHOS 5.4*  --  4.0   GFR: Estimated Creatinine Clearance: 14.3 mL/min (A) (by C-G formula based on SCr of 5.64 mg/dL (H)). Recent Labs  Lab 11/05/18 0647 11/06/18 0502 11/08/18 1121 11/09/18 0409  WBC 26.0* 25.9* 15.0* 18.3*    Liver Function Tests: Recent Labs  Lab 11/05/18 0647 11/08/18 1121  AST  --  115*  ALT  --  101*  ALKPHOS  --  354*  BILITOT  --  0.9  PROT  --  7.4  ALBUMIN 1.9* 1.6*   Recent Labs  Lab 11/08/18 1121  LIPASE 23  AMYLASE 44   No results for input(s): AMMONIA in the last 168 hours.  ABG    Component Value Date/Time   PHART 7.403 11/08/2018 2022   PCO2ART 43.1 11/08/2018 2022   PO2ART 107.0 11/08/2018 2022   HCO3 26.8 11/08/2018 2022   TCO2 28 11/08/2018 2022   ACIDBASEDEF 10.0 (H) 10/19/2018 1953   O2SAT 98.0 11/08/2018 2022     Coagulation Profile: Recent Labs  Lab 11/08/18 1546  INR 1.19    Cardiac Enzymes: No results for input(s): CKTOTAL, CKMB, CKMBINDEX, TROPONINI in the last 168 hours.  HbA1C: Hgb A1c MFr Bld  Date/Time Value Ref Range Status  10/23/2016 02:54 PM 8.0 (H) 4.8 - 5.6 % Final    Comment:    (NOTE)         Pre-diabetes: 5.7 - 6.4         Diabetes: >6.4         Glycemic control for adults with diabetes: <7.0   07/17/2015 02:09 AM 9.4 (H) 4.8 - 5.6 % Final    Comment:    (NOTE)         Pre-diabetes:  5.7 - 6.4         Diabetes: >6.4         Glycemic control for adults with diabetes: <7.0     CBG: Recent Labs  Lab 11/08/18 2354 11/09/18 0413 11/09/18 0725 11/09/18 1127 11/09/18 1513  GLUCAP 73 125* 137* 165* 172*    Critical care time: 40 min.

## 2018-11-09 NOTE — Consult Note (Signed)
Chief Complaint: Patient was seen in consultation today for percutaneous cholecystostomy drain placement Chief Complaint  Patient presents with  . Abdominal Pain   at the request of Dr Leane Para  Supervising Physician: Daryll Brod  Patient Status: [facility] - In-pt  History of Present Illness: Catherine Williams is a 57 y.o. female    VDRF/trach ESRD COPD; diastolic HF; DM;  Multiple cardiac arrests Transferred from [facility] to floor secondary acute cholecystitis RLQ pain; leukocytosis [place] yesterday:  Findings suggest acute cholecystitis with biliary sludge and mobile calculi noted. Positive sonographic Murphy's with gallbladder distention and mural thickening of the gallbladder wall to 6 mm in thickness is noted.  Not surgical candidate per [facility] reviewed with Dr Lyndel Pleasure procedure   Past Medical History:  Diagnosis Date  . Acute on chronic respiratory failure with hypoxia ([place])   . Anemia   . Arthritis Dec. 2014   Gout  . Asthma   . Cardiac arrest ([place])   . CHF (congestive heart failure) ([place])   . Chronic diastolic heart failure of unknown etiology ([place])   . COPD (chronic obstructive pulmonary disease) ([place])   . Diabetes mellitus   . End stage renal disease on dialysis ([place])   . GERD (gastroesophageal reflux disease)   . Healthcare-associated pneumonia   . Heart failure   . Hyperlipidemia   . Hypertension   . Increased tracheal secretions   . Kidney disease   . Morbid obesity ([place])   . Peripheral vascular disease ([place])   . Pinched nerve   . Requires supplemental oxygen    as needed  . Sleep apnea    wears cpap  . Wears dentures     Past Surgical History:  Procedure Laterality Date  . Atherectomy and angioplasty  10/18/2011   left posterior tibial artery  . AV FISTULA PLACEMENT Left 10/04/2018   Procedure: ARTERIOVENOUS (AV) FISTULA CREATION LEFT ARM;  Surgeon: Serafina Mitchell, MD;  Location: [facility];  Service: Vascular;  Laterality: Left;  .  COLONOSCOPY    . HAND SURGERY     right  . IR FLUORO GUIDE CV LINE LEFT  10/26/2018  . IR US GUIDE VASC ACCESS LEFT  10/26/2018  . LEFT HEART CATH AND CORONARY ANGIOGRAPHY N/A 10/19/2018   Procedure: LEFT HEART CATH AND CORONARY ANGIOGRAPHY;  Surgeon: Leonie Man, MD;  Location: [facility];  Service: Cardiovascular;  Laterality: N/A;  . MULTIPLE TOOTH EXTRACTIONS    . OVARY SURGERY     Left  . TOE AMPUTATION  Sept. 25,2012   Left 4th and 5th toes    Allergies: Omnipaque [iohexol]  Medications: Prior to Admission medications   Medication Sig Start Date End Date Taking? Authorizing Provider  acetaminophen (TYLENOL) 325 MG tablet Take 650 mg by mouth every 6 (six) hours as needed.   Yes [provider]  atorvastatin (LIPITOR) 10 MG tablet Take 10 mg by mouth daily.   Yes [provider]  ciprofloxacin (CIPRO IN D5W) 400 MG/200ML SOLN Inject 400 mg into the vein every 12 (twelve) hours.   Yes [provider]  dextrose 50 % solution Inject 50 mLs into the vein as needed for low blood sugar.   Yes [provider]  Dextrose-Sodium Chloride (DEXTROSE 5 % AND 0.45% NACL) infusion Inject 1,000 mLs into the vein as needed.   Yes [provider]  diphenhydrAMINE 25 mg in sodium chloride 0.9 % 50 mL Inject 25 mg into the vein every  6 (six) hours as needed (Itching).   Yes [provider]  famotidine (PEPCID) 20 MG tablet Take 20 mg by mouth 2 (two) times daily.   Yes [provider]  Heparin Sodium, Porcine, PF 5000 UNIT/ML SOLN Inject 5,000 Units as directed 2 (two) times daily.   Yes [provider]  HYDROcodone-acetaminophen (NORCO/VICODIN) 5-325 MG tablet Take 1 tablet by mouth every 6 (six) hours as needed for moderate pain.   Yes [provider]  insulin glargine (LANTUS) 100 UNIT/ML injection Inject 0.3 mLs (30 Units total) into the skin daily. 11/01/18  Yes Buriev, Arie Sabina, MD  insulin lispro  (HUMALOG) 100 UNIT/ML injection Inject 2-10 Units into the skin every 6 (six) hours. Sliding Scale\\   From 70-150 = 0 units           151-200= 2 units           201-250 = 4 units           251-300 = 6 units           301-350 = 8 units            351-400 = 10 units For Blood Glucose above 400, give 12 units CALL MD and obtain stat lab verification   Yes [provider]  ipratropium-albuterol (DUONEB) 0.5-2.5 (3) MG/3ML SOLN Take 3 mLs by nebulization 3 (three) times daily. Patient taking differently: Take 3 mLs by nebulization as needed.  11/01/18  Yes Buriev, Arie Sabina, MD  magnesium oxide (MAG-OX) 400 MG tablet Take 400 mg by mouth as needed.   Yes [provider]  Magnesium Sulfate 1000 MG/1.6ML SOLN Inject 1 g into the vein as needed. In 100 Ml Drip rate 200 ml/h1   Yes [provider]  metronidazole (FLAGYL) 500-0.74 MG/100ML-% Inject 500 mg into the vein every 8 (eight) hours. NaCl 0.79% Drip Rate : 100 ml/hr Give after dialysis and dialysis days   Yes [provider]  midodrine (PROAMATINE) 2.5 MG tablet Take 1 tablet (2.5 mg total) by mouth 3 (three) times daily with meals. 11/01/18  Yes Buriev, Arie Sabina, MD  morphine 2 MG/ML injection Inject 2 mg into the vein every 8 (eight) hours as needed.   Yes [provider]  Multiple Vitamins-Minerals (MULTIVITAMINS THER. W/MINERALS) TABS tablet Take 1 tablet by mouth daily.   Yes [provider]  Nutritional Supplements (FEEDING SUPPLEMENT, VITAL HIGH PROTEIN,) LIQD liquid Place 1,000 mLs into feeding tube continuous. 11/01/18  Yes Buriev, Arie Sabina, MD  ondansetron [facility]) 4 MG/5ML solution Take 4 mg by mouth every 8 (eight) hours as needed for nausea or vomiting.   Yes [provider]  polyethylene glycol (MIRALAX / GLYCOLAX) packet Take 17 g by mouth daily.   Yes [provider]  Potassium Bicarb & Chloride (EFFERVESCENT POT CHLORIDE PO) Take 80 mEq by mouth as  needed.   Yes [provider]  potassium chloride 10 MEQ/100ML Inject 10 mEq into the vein as needed (Drip Rate 4.167).   Yes [provider]  sodium polystyrene (KAYEXALATE) 15 GM/60ML suspension Take 15-30 g by mouth as needed.   Yes [provider]  acetaminophen (TYLENOL) 160 MG/5ML solution Place 20.3 mLs (650 mg total) into feeding tube every 6 (six) hours as needed for mild pain or fever. Patient not taking: Reported on 11/08/2018 11/01/18   Kinnie Feil, MD  allopurinol (ZYLOPRIM) 100 MG tablet Place 1 tablet (100 mg total) into feeding tube at bedtime.  Patient not taking: Reported on 11/08/2018 11/01/18   Kinnie Feil, MD  Amino Acids-Protein Hydrolys (FEEDING SUPPLEMENT, PRO-STAT SUGAR FREE 64,) LIQD Place 30 mLs into feeding tube 3 (three) times daily. Patient not taking: Reported on 11/08/2018 11/01/18   Kinnie Feil, MD  aspirin 81 MG tablet Place 1 tablet (81 mg total) into feeding tube daily. Patient not taking: Reported on 11/08/2018 11/01/18   Kinnie Feil, MD  chlorhexidine gluconate, MEDLINE KIT, (PERIDEX) 0.12 % solution 15 mLs by Mouth Rinse route 2 (two) times daily. Patient not taking: Reported on 11/08/2018 11/01/18   Kinnie Feil, MD  insulin aspart (NOVOLOG) 100 UNIT/ML injection Inject 0-20 Units into the skin every 4 (four) hours. Insulin sliding scale Patient not taking: Reported on 11/08/2018 11/01/18   Kinnie Feil, MD  Multiple Vitamin (MULTIVITAMIN) LIQD Place 15 mLs into feeding tube daily. Patient not taking: Reported on 11/08/2018 11/01/18   Kinnie Feil, MD  pantoprazole sodium (PROTONIX) 40 mg/20 mL PACK Place 20 mLs (40 mg total) into feeding tube daily. Patient not taking: Reported on 11/08/2018 11/01/18   Kinnie Feil, MD  rosuvastatin (CRESTOR) 10 MG tablet Place 1 tablet (10 mg total) into feeding tube every evening. Patient not taking: Reported on 11/08/2018 11/01/18   Kinnie Feil,  MD     Family History  Problem Relation Age of Onset  . Lung cancer Mother   . Clotting disorder Mother   . Heart disease Mother   . Cancer Mother        Lung  . Deep vein thrombosis Mother   . Diabetes Mother   . Hypertension Mother   . Varicose Veins Mother   . Cancer Father   . Clotting disorder Sister   . Heart attack Sister   . Diabetes Sister   . Clotting disorder Brother   . Deep vein thrombosis Brother   . Diabetes Brother   . Heart disease Brother   . Heart disease Sister     Social History   Socioeconomic History  . Marital status: Single    Spouse name: Not on file  . Number of children: Y  . Years of education: Not on file  . Highest education level: Not on file  Occupational History  . 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  Social Needs  . Financial resource strain: Not on file  . Food insecurity:    Worry: Not on file    Inability: Not on file  . Transportation needs:    Medical: Not on file    Non-medical: Not on file  Tobacco Use  . Smoking status: Never Smoker  . Smokeless tobacco: Never Used  Substance and Sexual Activity  . Alcohol use: No    Alcohol/week: 0.0 standard drinks  . Drug use: Not Currently    Types: Marijuana    Comment: FORMER>>smoked weed from age 57 to 38>> quit at age 23   . Sexual activity: Not on file  Lifestyle  . Physical activity:    Days per week: Not on file    Minutes per session: Not on file  . Stress: Not on file  Relationships  . Social connections:    Talks on phone: Not on file    Gets together: Not on file    Attends religious service: Not on file    Active member of club or organization: Not on file    Attends  meetings of clubs or organizations: Not on file    Relationship status: Not on file  Other Topics Concern  . Not on file  Social History Narrative   Lives with daughter          Review of Systems: A 12 point ROS discussed and pertinent  positives are indicated in the HPI above.  All other systems are negative.  Review of Systems  Gastrointestinal: Positive for abdominal pain.  Neurological: Positive for weakness.  Psychiatric/Behavioral: Positive for confusion.    Vital Signs: BP (!) 123/26   Pulse 79   Temp 98.2 F (36.8 C) (Oral)   Resp (!) 29   Ht 5' 2"  (1.575 m)   SpO2 100%   BMI 54.44 kg/m   Physical Exam  Cardiovascular: Normal rate.  Pulmonary/Chest:  vent  Abdominal: There is tenderness.  Neurological: She is alert.  Skin: Skin is warm and dry.  Psychiatric:  consented with Dtr Myrene Galas via phone  Vitals reviewed.   Imaging: Ct Abdomen Pelvis Wo Contrast  Result Date: 11/08/2018 CLINICAL DATA:  Unspecified abdominal pain. EXAM: CT ABDOMEN AND PELVIS WITHOUT CONTRAST TECHNIQUE: Multidetector CT imaging of the abdomen and pelvis was performed following the standard protocol without IV contrast. COMPARISON:  CT pelvis 10/30/2018 FINDINGS: Lower chest: Consolidation or atelectasis in the right lung base. Small right pleural effusion. Cardiac enlargement. Coronary artery calcifications. Hepatobiliary: Multiple stones in the gallbladder. Gallbladder wall thickening with stranding in the pericholecystic fat. Changes likely to represent cholecystitis. No bile duct dilatation. No focal liver lesions. Pancreas: Unremarkable. No pancreatic ductal dilatation or surrounding inflammatory changes. Spleen: Normal in size without focal abnormality. Adrenals/Urinary Tract: No adrenal gland nodules. Multiple renal cysts on the right. No hydronephrosis or hydroureter. Bladder wall is not thickened. Stomach/Bowel: Stomach, small bowel, and colon are not abnormally distended. Enteric tube with tip in the stomach. Stool fills the colon. Vascular/Lymphatic: Prominent mesenteric and celiac axis vascular calcifications. No aneurysm. Reproductive: Uterus and ovaries are not enlarged. Gonadal vein phleboliths. Other: No free air or free  fluid in the abdomen. Abdominal wall musculature is lax without any discrete hernia. Musculoskeletal: Degenerative changes in the spine. No destructive bone lesions. IMPRESSION: 1. Cholelithiasis with inflammatory changes in the gallbladder consistent with acute cholecystitis. 2. Consolidation or atelectasis in the right lung base with small right pleural effusion. Aortic Atherosclerosis (ICD10-I70.0). Electronically Signed   By: Lucienne Capers M.D.   On: 11/08/2018 01:32   Dg Abd 1 View  Result Date: 11/04/2018 CLINICAL DATA:  Right lower quadrant abdominal pain EXAM: ABDOMEN - 1 VIEW COMPARISON:  11/01/2018 FINDINGS: Feeding tube appears to loop within the stomach. The left upper quadrant is not visualized and the tip of the feeding tube is not visualized. Nonobstructive bowel gas pattern. Moderate stool burden throughout the colon. Study limited by body habitus. No visible free air. IMPRESSION: Moderate stool burden. No evidence of bowel obstruction or free air. Electronically Signed   By: Rolm Baptise M.D.   On: 11/04/2018 18:34   Ct Angio Chest Pe W Or Wo Contrast  Result Date: 10/25/2018 CLINICAL DATA:  Followup for cardiac arrest. Patient with a tracheostomy tube. EXAM: CT ANGIOGRAPHY CHEST WITH CONTRAST TECHNIQUE: Multidetector CT imaging of the chest was performed using the standard protocol during bolus administration of intravenous contrast. Multiplanar CT image reconstructions and MIPs were obtained to evaluate the vascular anatomy. CONTRAST:  8m ISOVUE-370 IOPAMIDOL (ISOVUE-370) INJECTION 76% COMPARISON:  Chest radiograph, 10/23/2018 and older exams. FINDINGS: Cardiovascular: The opacification of  the pulmonary arteries beyond the branch points into the segmental pulmonary arteries is suboptimal. Pulmonary emboli cannot be excluded in the segmental and smaller branches. Allowing for this, there is no evidence of a large, central pulmonary embolus. Heart is mildly enlarged. No pericardial  effusion. There are coronary artery calcifications. No aortic dissection. No aortic atherosclerosis. Mediastinum/Nodes: Well-positioned tracheostomy tube. Nasal/orogastric tube passes into the stomach. No neck base, axillary, mediastinal or hilar masses or enlarged lymph nodes. Trachea is patent. Lungs/Pleura: There is dependent lower lobe atelectasis. Mild bilateral hazy ground-glass opacities are noted most evident in the upper lobes. This is likely due to the expiratory nature of fell to air trapping. Bilateral infection/inflammation is not excluded but felt less likely. 8 x 6 mm, mean 7 mm, pleural-based left upper lobe nodule, image 24, series 9. No other nodules. No pleural effusion.  No pneumothorax. Upper Abdomen: No acute abnormality. Musculoskeletal: No fracture or acute finding. There are degenerative changes along the thoracic spine. There are ill-defined areas of relative lucency, with multiple Schmorl's nodes. No definite lytic bone lesion. Review of the MIP images confirms the above findings. IMPRESSION: 1. Limited exam. Segmental and smaller pulmonary arteries not assessed for pulmonary emboli. Allowing for this, there is no large/central pulmonary embolus. 2. Dependent atelectasis in the lower lobes. Patchy areas of ground-glass opacity most evident in the upper lobes, most likely shunting related small airways disease in this expiratory study. Consider infection/inflammation if there are consistent clinical findings. No evidence of pulmonary edema. Electronically Signed   By: Lajean Manes M.D.   On: 10/25/2018 18:10   Ct Pelvis Wo Contrast  Result Date: 10/31/2018 CLINICAL DATA:  Postmenopausal bleeding. EXAM: CT PELVIS WITHOUT CONTRAST TECHNIQUE: Multidetector CT imaging of the pelvis was performed following the standard protocol without intravenous contrast. COMPARISON:  None. FINDINGS: Urinary Tract: No hydroureteronephrosis. The urinary bladder is unremarkable for the degree of  distention. Bowel: Enteric contrast reaches the rectum. No bowel obstruction is seen. Small and large bowel loops are seen within the patient's pannus pannus. Vascular/Lymphatic: Nonaneurysmal distal abdominal aorta with atherosclerosis of the common iliac arteries and branch vessels. Reproductive: Fibroid appearing uterus with intramural scattered coarse calcifications likely representing areas of fibroid degeneration. No adnexal mass. Other: Mild soft tissue anasarca. Layering gallstones of the included gallbladder without wall thickening or pericholecystic fluid. Musculoskeletal: No suspicious bone lesions identified. IMPRESSION: 1. No acute bowel obstruction or inflammation. 2. Fibroid appearing uterus. 3. Mild soft tissue anasarca. 4. Cholelithiasis without acute cholecystitis. Electronically Signed   By: Ashley Royalty M.D.   On: 10/31/2018 00:02   Ir Fluoro Guide Cv Line Left  Result Date: 10/26/2018 INDICATION: 57 year old female with end-stage renal disease in need of hemodialysis. She presents for placement of a tunneled hemodialysis catheter. EXAM: TUNNELED CENTRAL VENOUS HEMODIALYSIS CATHETER PLACEMENT WITH ULTRASOUND AND FLUOROSCOPIC GUIDANCE MEDICATIONS: Patient currently receiving cefepime as an inpatient. No additional antibiotic prophylaxis was administered. ANESTHESIA/SEDATION: Moderate (conscious) sedation was employed during this procedure. A total of Versed 1 mg and Fentanyl 50 mcg was administered intravenously. Moderate Sedation Time: 20 minutes. The patient's level of consciousness and vital signs were monitored continuously by radiology nursing throughout the procedure under my direct supervision. FLUOROSCOPY TIME:  Fluoroscopy Time: 0 minutes 42 seconds (9 mGy). COMPLICATIONS: None immediate. PROCEDURE: Informed written consent was obtained from the patient after a discussion of the risks, benefits, and alternatives to treatment. Questions regarding the procedure were encouraged and  answered. The left neck and chest were prepped with chlorhexidine  in a sterile fashion, and a sterile drape was applied covering the operative field. Maximum barrier sterile technique with sterile gowns and gloves were used for the procedure. A timeout was performed prior to the initiation of the procedure. After creating a small venotomy incision, a micropuncture kit was utilized to access the left internal jugular vein under direct, real-time ultrasound guidance after the overlying soft tissues were anesthetized with 1% lidocaine with epinephrine. Ultrasound image documentation was performed. The microwire was kinked to measure appropriate catheter length. A stiff Glidewire was advanced to the level of the IVC and the micropuncture sheath was exchanged for a peel-away sheath. A palindrome tunneled hemodialysis catheter measuring 28 cm from tip to cuff was tunneled in a retrograde fashion from the anterior chest wall to the venotomy incision. The catheter was then placed through the peel-away sheath with tips ultimately positioned within the superior aspect of the right atrium. Final catheter positioning was confirmed and documented with a spot radiographic image. The catheter aspirates and flushes normally. The catheter was flushed with appropriate volume heparin dwells. The catheter exit site was secured with a 0-Prolene retention suture. The venotomy incision was sealed with Dermabond. Dressings were applied. The patient tolerated the procedure well without immediate post procedural complication. IMPRESSION: Successful placement of 28 cm tip to cuff tunneled hemodialysis catheter via the left internal jugular vein with tips terminating within the superior aspect of the right atrium. The catheter is ready for immediate use. Signed, Criselda Peaches, MD, [place] [facility] Radiology Electronically Signed   By: Jacqulynn Cadet M.D.   On: 10/26/2018 13:38   Ir US  Guide Vasc Access Left  Result Date: 10/26/2018 INDICATION: 57 year old female with end-stage renal disease in need of hemodialysis. She presents for placement of a tunneled hemodialysis catheter. EXAM: TUNNELED CENTRAL VENOUS HEMODIALYSIS CATHETER PLACEMENT WITH ULTRASOUND AND FLUOROSCOPIC GUIDANCE MEDICATIONS: Patient currently receiving cefepime as an inpatient. No additional antibiotic prophylaxis was administered. ANESTHESIA/SEDATION: Moderate (conscious) sedation was employed during this procedure. A total of Versed 1 mg and Fentanyl 50 mcg was administered intravenously. Moderate Sedation Time: 20 minutes. The patient's level of consciousness and vital signs were monitored continuously by radiology nursing throughout the procedure under my direct supervision. FLUOROSCOPY TIME:  Fluoroscopy Time: 0 minutes 42 seconds (9 mGy). COMPLICATIONS: None immediate. PROCEDURE: Informed written consent was obtained from the patient after a discussion of the risks, benefits, and alternatives to treatment. Questions regarding the procedure were encouraged and answered. The left neck and chest were prepped with chlorhexidine in a sterile fashion, and a sterile drape was applied covering the operative field. Maximum barrier sterile technique with sterile gowns and gloves were used for the procedure. A timeout was performed prior to the initiation of the procedure. After creating a small venotomy incision, a micropuncture kit was utilized to access the left internal jugular vein under direct, real-time ultrasound guidance after the overlying soft tissues were anesthetized with 1% lidocaine with epinephrine. Ultrasound image documentation was performed. The microwire was kinked to measure appropriate catheter length. A stiff Glidewire was advanced to the level of the IVC and the micropuncture sheath was exchanged for a peel-away sheath. A palindrome tunneled hemodialysis catheter measuring 28 cm from tip to cuff was tunneled  in a retrograde fashion from the anterior chest wall to the venotomy incision. The catheter was then placed through the peel-away sheath with tips ultimately positioned within the superior aspect of the right atrium. Final catheter positioning was confirmed  and documented with a spot radiographic image. The catheter aspirates and flushes normally. The catheter was flushed with appropriate volume heparin dwells. The catheter exit site was secured with a 0-Prolene retention suture. The venotomy incision was sealed with Dermabond. Dressings were applied. The patient tolerated the procedure well without immediate post procedural complication. IMPRESSION: Successful placement of 28 cm tip to cuff tunneled hemodialysis catheter via the left internal jugular vein with tips terminating within the superior aspect of the right atrium. The catheter is ready for immediate use. Signed, Criselda Peaches, MD, [place] [facility] Radiology Electronically Signed   By: Jacqulynn Cadet M.D.   On: 10/26/2018 13:38   Portable Chest Xray  Result Date: 11/09/2018 CLINICAL DATA:  Tracheostomy, respiratory failure EXAM: PORTABLE CHEST 1 VIEW COMPARISON:  11/05/2018 FINDINGS: Cardiomegaly with vascular congestion and bibasilar atelectasis. No overt edema. Support devices are stable. IMPRESSION: Cardiomegaly, vascular congestion and bibasilar atelectasis. Electronically Signed   By: Rolm Baptise M.D.   On: 11/09/2018 08:47   Dg Chest Port 1 View  Result Date: 11/05/2018 CLINICAL DATA:  Pneumonia EXAM: PORTABLE CHEST 1 VIEW COMPARISON:  11/01/2018 FINDINGS: Tracheostomy tube, feeding tube, and left subclavian dialysis catheter remain in place. Low lung volumes are present, causing crowding of the pulmonary vasculature. Interstitial accentuation persists. Indistinct right perihilar airspace opacity. Subsegmental atelectasis at the left lung base. No blunting of the costophrenic  angles. IMPRESSION: 1. Indistinct perihilar airspace opacity on the right, potentially from resolving edema. Interstitial accentuation noted bilaterally. Overall the appearance is mildly improved from 11/01/2018. 2. Low lung volumes. Electronically Signed   By: Van Clines M.D.   On: 11/05/2018 17:06   Dg Chest Port 1 View  Result Date: 11/01/2018 CLINICAL DATA:  Respiratory failure EXAM: PORTABLE CHEST 1 VIEW COMPARISON:  10/23/2018 FINDINGS: Endotracheal tube in good position. Right jugular central venous catheter removed. Interval placement of left jugular sent venous catheter tip at the cavoatrial junction. No pneumothorax Diffuse bilateral airspace disease unchanged, probable edema. IMPRESSION: Left jugular central venous catheter tip in the SVC at the cavoatrial junction. Diffuse bilateral airspace disease consistent with edema. Electronically Signed   By: Franchot Gallo M.D.   On: 11/01/2018 18:33   Dg Chest Port 1 View  Result Date: 10/23/2018 CLINICAL DATA:  Acute respiratory failure. EXAM: PORTABLE CHEST 1 VIEW COMPARISON:  Radiograph of October 19, 2018. FINDINGS: Stable cardiomegaly. Tracheostomy and nasogastric tubes are unchanged in position. Stable right internal jugular catheter is noted. Stable central pulmonary vascular congestion and possible pulmonary edema is noted. No pneumothorax or pleural effusion is noted. Bony thorax is unremarkable. IMPRESSION: Stable support apparatus. Stable cardiomegaly with central pulmonary vascular congestion and possible pulmonary edema. Electronically Signed   By: Marijo Conception, M.D.   On: 10/23/2018 07:31   Dg Chest Port 1 View  Result Date: 10/19/2018 CLINICAL DATA:  Cardiac arrest. EXAM: PORTABLE CHEST 1 VIEW COMPARISON:  Chest x-ray from same day at 14:56. FINDINGS: Unchanged tracheostomy tube, enteric tube, and right internal jugular catheter. Stable cardiomegaly and central pulmonary vascular congestion. New hazy opacities throughout  both lungs. No pleural effusion or pneumothorax. No acute osseous abnormality. IMPRESSION: 1. New hazy opacities throughout both lungs, suspicious for pulmonary edema. Electronically Signed   By: Titus Dubin M.D.   On: 10/19/2018 16:20   Dg Chest Port 1 View  Result Date: 10/19/2018 CLINICAL DATA:  Post tracheostomy. EXAM: PORTABLE CHEST 1 VIEW COMPARISON:  None. FINDINGS: New tracheostomy tube projects over  the upper trachea. No evidence of pneumomediastinum. No pneumothorax. Right IJ catheter tip terminates in the distal SVC. The left IJ approach central line has been removed. Gastric tube extends below the left hemidiaphragm. Lung volumes remain low. There is stable cardiomegaly with central vascular congestion and left basilar atelectasis. IMPRESSION: Low lung volumes with stable cardiomegaly and central vascular congestion. Support lines and tubes as above in satisfactory position. No pneumothorax or pneumomediastinum. Electronically Signed   By: Ashley Royalty M.D.   On: 10/19/2018 15:13   Dg Chest Port 1 View  Result Date: 10/18/2018 CLINICAL DATA:  Respiratory distress EXAM: PORTABLE CHEST 1 VIEW COMPARISON:  10/17/2018 FINDINGS: Endotracheal tube, nasogastric catheter and right and left jugular lines are again identified and stable. Increased vascular congestion is noted when compare with the prior exam. Mild left basilar atelectasis is noted increased from the prior study. No pneumothorax is seen. No bony abnormality is noted. IMPRESSION: Tubes and lines stable from the prior exam. No pneumothorax is noted. Increasing left basilar atelectasis. Increased vascular congestion is noted. Electronically Signed   By: Inez Catalina M.D.   On: 10/18/2018 08:42   Dg Chest Port 1 View  Result Date: 10/17/2018 CLINICAL DATA:  Hypoxia EXAM: PORTABLE CHEST 1 VIEW COMPARISON:  October 16, 2018 FINDINGS: Endotracheal tube tip is 4.3 cm above the carina. Nasogastric tube tip and side port below the diaphragm.  Right jugular catheter tip is in the superior vena cava. Left jugular catheter tip is at the junction of the left innominate vein and superior vena cava. No pneumothorax. There is mild bibasilar atelectasis. Lungs elsewhere are clear. The heart is upper normal in size with pulmonary vascularity normal. No adenopathy. No bone lesions. IMPRESSION: Tube and catheter positions as described without pneumothorax. Mild bibasilar atelectasis. No consolidation. Stable cardiac silhouette. Electronically Signed   By: Lowella Grip III M.D.   On: 10/17/2018 07:10   Dg Chest Port 1 View  Result Date: 10/16/2018 CLINICAL DATA:  Acute respiratory failure EXAM: PORTABLE CHEST 1 VIEW COMPARISON:  10/13/2018 FINDINGS: Endotracheal tube in good position and unchanged. Right jugular central venous catheter tip in the SVC. Left jugular central venous catheter tip in the SVC unchanged. NG tube enters the stomach with the tip not visualized. Mild bibasilar airspace disease has improved in the interval. Negative for edema or effusion. IMPRESSION: Support lines remain in good position. Bibasilar airspace disease with interval improvement. Electronically Signed   By: Franchot Gallo M.D.   On: 10/16/2018 09:40   Dg Chest Port 1 View  Result Date: 10/13/2018 CLINICAL DATA:  Hypoxia EXAM: PORTABLE CHEST 1 VIEW COMPARISON:  October 13, 2018 FINDINGS: Endotracheal tube tip is 3.7 cm above the carina. Nasogastric tube tip and side port are below the diaphragm. Right jugular catheter tip is in the superior vena cava. Left jugular catheter tip is in the left innominate vein near the junction with the superior vena cava. No pneumothorax. There is atelectatic change in the right mid lung. There is a small focus of airspace consolidation in the medial right base. Lungs elsewhere are clear. There is cardiomegaly with pulmonary vascularity normal. No adenopathy. No bone lesions. IMPRESSION: Tube and catheter positions as described without  pneumothorax. Small focus of consolidation medial right base, felt to represent a focal area of pneumonia. Atelectasis right mid lung. Left lung clear. Heart mildly enlarged. Electronically Signed   By: Lowella Grip III M.D.   On: 10/13/2018 23:19   Dg Chest [facility]  Result Date: 10/13/2018 CLINICAL DATA:  56 year old female with respiratory failure status post cardiac arrest. EXAM: PORTABLE CHEST 1 VIEW COMPARISON:  10/12/2018 and earlier. FINDINGS: Portable AP semi upright view at 0504 hours. Stable endotracheal tube tip just below the clavicles. Enteric tube courses to the left abdomen, tip not included. Bilateral IJ approach central lines remain in place. Pacer or resuscitation pads project over the left chest. Mildly lower lung volumes and more kyphotic view. Stable cardiac size and mediastinal contours. Coarse and indistinct bilateral pulmonary interstitial opacity persists. No pneumothorax or definite pleural effusion. Paucity of bowel gas in the upper abdomen. IMPRESSION: 1.  Stable lines and tubes. 2. Persistent coarse bilateral pulmonary interstitial opacity. Top differential considerations include interstitial edema, atypical respiratory infection, ARDS. No pleural effusion is evident. Electronically Signed   By: Genevie Ann M.D.   On: 10/13/2018 07:45   Dg Chest Port 1 View  Result Date: 10/12/2018 CLINICAL DATA:  Hypoxia EXAM: PORTABLE CHEST 1 VIEW COMPARISON:  October 12, 2018 FINDINGS: Endotracheal tube tip is 5.5 cm above the carina. Nasogastric tube tip and side port are below the diaphragm. Right central catheter tip is in the superior vena cava. Left central catheter tip is in left innominate vein. No pneumothorax. There is no edema or consolidation. Heart is mildly enlarged with pulmonary vascularity normal. No adenopathy. No evident bone lesions. IMPRESSION: Tube and catheter positions as described without pneumothorax. No edema or consolidation. Mild cardiac enlargement.  Electronically Signed   By: Lowella Grip III M.D.   On: 10/12/2018 20:07   Dg Chest Port 1 View  Result Date: 10/12/2018 CLINICAL DATA:  Central line placement. EXAM: PORTABLE CHEST 1 VIEW COMPARISON:  10/12/2018. FINDINGS: Interim placement of right IJ line. Its tip is over the superior vena cava. Left IJ line in stable position. Endotracheal tube and NG tube in stable position. Cardiomegaly. Pulmonary vascular prominence. Mild right base infiltrate. No pleural effusion or pneumothorax. IMPRESSION: 1. Interim placement right IJ line, its tip is over the superior vena cava. Endotracheal tube, NG tube, left IJ line stable position. 2.  Cardiomegaly with pulmonary venous congestion. 3.  Right base infiltrate. Electronically Signed   By: Marcello Moores  Register   On: 10/12/2018 17:04   Dg Chest Port 1 View  Result Date: 10/12/2018 CLINICAL DATA:  Intubation.  Respiratory failure. EXAM: PORTABLE CHEST 1 VIEW COMPARISON:  10/09/2018. FINDINGS: Endotracheal tube has been withdrawn and its tip is now 2.7 cm above the carina. NG tube noted with tip below left hemidiaphragm. Left IJ line noted with tip over the proximal SVC. Cardiomegaly with bilateral pulmonary venous congestion and infiltrates/edema. No pleural effusion or pneumothorax. IMPRESSION: 1. Endotracheal tube has been withdrawn, its tip is now 2.7 cm above the carina. NG tube and left IJ line stable position. 2. Cardiomegaly with bilateral pulmonary infiltrates/edema. Small left pleural effusion. Similar findings noted on prior exam. Electronically Signed   By: [place]   On: 10/12/2018 10:51   Dg Abd Portable 1v  Result Date: 11/07/2018 CLINICAL DATA:  57 y/o  F; feeding tube position. EXAM: PORTABLE ABDOMEN - 1 VIEW COMPARISON:  11/04/2018 abdomen radiographs. FINDINGS: Enteric tube tip projects over the proximal gastric body. Mild gastric distention. Moderate volume of stool in the colon. Bones are unremarkable. IMPRESSION: Enteric tube  tip projects over the proximal gastric body. Electronically Signed   By: Kristine Garbe M.D.   On: 11/07/2018 06:17   Dg Abd Portable 1v  Result Date: 11/01/2018 CLINICAL DATA:  Encounter for nasogastric tube placement. EXAM: PORTABLE ABDOMEN - 1 VIEW COMPARISON:  Chest radiograph 10/22/2018 FINDINGS: Single portable view of the abdomen was obtained. Patient is rotated on this examination. Feeding tube extends into the abdomen and appears to be coiled in the stomach. However, the tip of the tube is not visualized. IMPRESSION: Feeding tube extends into the abdomen but the tip is not visualized. Electronically Signed   By: Markus Daft M.D.   On: 11/01/2018 18:33   US Abdomen Limited Ruq  Result Date: 11/08/2018 CLINICAL DATA:  Right upper quadrant pain EXAM: ULTRASOUND ABDOMEN LIMITED RIGHT UPPER QUADRANT COMPARISON:  11/08/2018 CT FINDINGS: Gallbladder: Distended gallbladder with dependent calculi. Mural thickening up to 6 mm. Positive sonographic Murphy's is noted. The largest calcification measures 4.6 mm. There is biliary sludge admixed with the stones. Common bile duct: Diameter: 4.3 mm and normal without choledocholithiasis. Liver: Suboptimal assessment of the liver due to patient inability to move appropriately for optimal imaging. No definite space-occupying mass. Portal vein is patent on color Doppler imaging with normal direction of blood flow towards the liver. IMPRESSION: Findings suggest acute cholecystitis with biliary sludge and mobile calculi noted. Positive sonographic Murphy's with gallbladder distention and mural thickening of the gallbladder wall to 6 mm in thickness is noted. Electronically Signed   By: Ashley Royalty M.D.   On: 11/08/2018 19:24    Labs:  CBC: Recent Labs    11/05/18 0647 11/06/18 0502 11/08/18 1121 11/09/18 0409  WBC 26.0* 25.9* 15.0* 18.3*  HGB 7.6* 7.7* 7.4* 6.8*  HCT 26.4* 27.0* 25.9* 24.4*  PLT 284 263 259 311    COAGS: Recent Labs     10/19/18 0222 10/19/18 1147  10/24/18 0243  10/28/18 0632 10/29/18 0327 10/30/18 0247 10/31/18 0319 11/08/18 1546  INR 1.21 1.24  --  1.19  --   --   --   --   --  1.19  APTT 31  --    < > 93*   < > 32 33 33 31  --    < > = values in this interval not displayed.    BMP: Recent Labs    11/02/18 1150 11/05/18 0647 11/08/18 1121 11/09/18 0409  NA 134* 132* 134* 135  K 4.0 4.2 4.8 4.6  CL 93* 94* 96* 99  CO2 25 25 24 25   GLUCOSE 134* 136* 168* 131*  BUN 80* 74* 47* 55*  CALCIUM 10.0 9.6  9.7 9.6 9.6  CREATININE 6.07* 7.00* 4.83* 5.64*  GFRNONAA 7* 6* 9* 7*  GFRAA 8* 7* 10* 9*    LIVER FUNCTION TESTS: Recent Labs    10/07/18 1947 10/12/18 2013  10/19/18 1555  10/30/18 0247 10/31/18 0319 11/05/18 0647 11/08/18 1121  BILITOT 0.5 0.6  --  0.5  --   --   --   --  0.9  AST 52* 75*  --  72*  --   --   --   --  115*  ALT 25 42  --  70*  --   --   --   --  101*  ALKPHOS 79 94  --  200*  --   --   --   --  354*  PROT 7.4 6.8  --  7.2  --   --   --   --  7.4  ALBUMIN 3.2* 2.3*   < > 2.0*   < > 1.9* 1.9* 1.9* 1.6*   < > = values in this interval not  displayed.    TUMOR MARKERS: No results for input(s): AFPTM, CEA, CA199, CHROMGRNA in the last 8760 hours.  Assessment and Plan:  Cholecystitis Leukocytosis [place] + Abd pain Not a surgical candidate per [facility] Scheduled for percutaneous cholecystostomy drain placement Risks and benefits discussed with the patient's dtr Akvia via phone including, but not limited to bleeding, infection, gallbladder perforation, bile leak, sepsis or even death.  All of her questions were answered, she is agreeable to proceed. Consent signed and in chart.   Thank you for this interesting consult.  I greatly enjoyed meeting DETRIA CUMMINGS and look forward to participating in their care.  A copy of this report was sent to the requesting provider on this date.  Electronically Signed: Lavonia Drafts, PA-C 11/09/2018, 9:49 AM   I spent a  total of 40 Minutes    in face to face in clinical consultation, greater than 50% of which was counseling/coordinating care for perc chole drain

## 2018-11-09 NOTE — Procedures (Signed)
Acute cholecystitis  S/p perc cholecystostomy  No comp Stable 150cc bile aspirated cx sent Full report inpacs

## 2018-11-09 NOTE — Discharge Summary (Signed)
Discharge Summary  Catherine Williams BTD:[id number] DOB: 09-03-1961  PCP: Vonna Drafts, FNP  Admit date: 11/08/2018 Discharge date: 11/09/2018  Time spent: 35 minutes  Recommendations for Outpatient Follow-up:  1. [facility] 2. Upon arrival, she will initiate dialysis. 3. New medication: Low-dose Neo-Synephrine continuous drip 4. New medication suggestion: Increasing midodrine to 10 mg p.o. 3 times daily  Discharge Diagnoses:  Active Hospital Problems   Diagnosis Date Noted  . Acute cholecystitis 11/08/2018  . Cholecystitis 11/08/2018  . Dependence on renal dialysis ([place])   . Ventilator dependence ([place])   . IDDM (insulin dependent diabetes mellitus) [facility])     Resolved Hospital Problems  No resolved problems to display.    Discharge Condition: Improved, [facility]  Diet recommendation: Carb modified  Vitals:   11/09/18 1516 11/09/18 1530  BP:  (!) 151/45  Pulse:  80  Resp:    Temp: 98 F (36.7 C)   SpO2:  100%    History of present illness:  57 year old female with past medical history of chronic respiratory failure now ventilator dependent with tracheostomy, COPD, diastolic CHF, diabetes mellitus and end-stage renal disease currently [facility] after being diagnosed with acute cholecystitis.  Patient felt to not be a candidate given multiple comorbidities for direct surgical option.  Plan to receive a percutaneous cholecystostomy tube.  Because of patient's ventilator status, she was placed in the ICU.  Hospital Course:  Active Problems:   IDDM (insulin dependent diabetes mellitus) [facility]): Patient made n.p.o. for procedure.  CBGs monitored with sliding scale.    Ventilator dependence ([place]) status post tracheostomy: Kept in ICU, no issues with ventilator during this hospitalization.  Pulmonary/capital care  managed tracheostomy and vent.  Chronic diastolic heart failure: Echocardiogram done 10/08/2018 noted grade 2 diastolic dysfunction.  Volume is managed with dialysis.    Acute cholecystitis: Not a candidate for surgical approach.  Seen by interventional radiology and patient underwent percutaneous drainage tube placement on 11/22.  She was continued on antibiotics during this hospital course.  Hypotension: Patient on chronic Midrin.  Had to be n.p.o. for percutaneous procedure so placement on Neo-Synephrine drip at low-dose which she tolerated well.  We will plan to continue this as she is going to dialysis.  She will reinitiate midodrine and recommendation made at this time for consideration of increasing her midodrine to 10 mg p.o. 3 times daily    Dependence on renal dialysis [facility]): Center, surgery contacted and initially had plan to do dialysis, but with [facility], she will initiate dialysis after arrival back    Procedures:  Placement percutaneous drainage tube 11/22  Consultations:  Interventional radiology  Nephrology  Pulmonary/critical care  Discharge Exam: BP (!) 151/45   Pulse 80   Temp 98 F (36.7 C) (Oral)   Resp (!) 26   Ht 5' 2" (1.575 m)   SpO2 100%   BMI 54.44 kg/m   General: Alert and oriented x3, anxious Cardiovascular: Regular rate and rhythm, S1-S2 Respiratory: Upper airway noise, decreased breath sounds bibasilar  Discharge Instructions You were cared for by a hospitalist during your hospital stay. If you have any questions about your discharge medications or the care you received while you were in the hospital after you are discharged, you can call the unit and asked to speak with the hospitalist on call  if the hospitalist that took care of you is not available. Once you are discharged, your primary care physician will handle any further medical issues. Please note that NO REFILLS for any discharge  medications will be authorized once you are discharged, as it is imperative that you return to your primary care physician (or establish a relationship with a primary care physician if you do not have one) for your aftercare needs so that they can reassess your need for medications and monitor your lab values.  Discharge Instructions    Bed rest   Complete by:  As directed    Diet - low sodium heart healthy   Complete by:  As directed      Allergies as of 11/09/2018      Reactions   Omnipaque [iohexol] Nausea And Vomiting   Contrast Dye- Effects Kidney's      Medication List    STOP taking these medications   aspirin 81 MG tablet   feeding supplement (PRO-STAT SUGAR FREE 64) Liqd   multivitamin Liqd   pantoprazole sodium 40 mg/20 mL Pack Commonly known as:  PROTONIX   rosuvastatin 10 MG tablet Commonly known as:  CRESTOR     TAKE these medications   acetaminophen 160 MG/5ML solution Commonly known as:  TYLENOL Place 20.3 mLs (650 mg total) into feeding tube every 6 (six) hours as needed for mild pain or fever. What changed:  Another medication with the same name was removed. Continue taking this medication, and follow the directions you see here.   atorvastatin 10 MG tablet Commonly known as:  LIPITOR Take 10 mg by mouth daily.   CIPRO IN D5W 400 MG/200ML Soln Generic drug:  ciprofloxacin Inject 400 mg into the vein every 12 (twelve) hours.   dextrose 5 % and 0.45% NaCl infusion Inject 1,000 mLs into the vein as needed.   dextrose 50 % solution Inject 50 mLs into the vein as needed for low blood sugar.   diphenhydrAMINE 25 mg in sodium chloride 0.9 % 50 mL Inject 25 mg into the vein every 6 (six) hours as needed (Itching).   EFFERVESCENT POT CHLORIDE PO Take 80 mEq by mouth as needed.   famotidine 20 MG tablet Commonly known as:  PEPCID Take 20 mg by mouth 2 (two) times daily.   feeding supplement (VITAL HIGH PROTEIN) Liqd liquid Place 1,000 mLs into  feeding tube continuous.   Heparin Sodium (Porcine) PF 5000 UNIT/ML Soln Inject 5,000 Units as directed 2 (two) times daily.   HYDROcodone-acetaminophen 5-325 MG tablet Commonly known as:  NORCO/VICODIN Take 1 tablet by mouth every 6 (six) hours as needed for moderate pain.   insulin glargine 100 UNIT/ML injection Commonly known as:  LANTUS Inject 0.3 mLs (30 Units total) into the skin daily.   insulin lispro 100 UNIT/ML injection Commonly known as:  HUMALOG Inject 2-10 Units into the skin every 6 (six) hours. Sliding Scale_0    From 70-150 = 0 units           151-200= 2 units           201-250 = 4 units           251-300 = 6 units           301-350 = 8 units            351-400 = 10 units For Blood Glucose above 400, give 12 units CALL MD and obtain stat lab verification   ipratropium-albuterol 0.5-2.5 (3) MG/3ML Soln  Commonly known as:  DUONEB Take 3 mLs by nebulization 3 (three) times daily. What changed:    when to take this  reasons to take this   magnesium oxide 400 MG tablet Commonly known as:  MAG-OX Take 400 mg by mouth as needed.   Magnesium Sulfate 1000 MG/1.6ML Soln Inject 1 g into the vein as needed. In 100 Ml Drip rate 200 ml/h1   metronidazole 500-0.74 MG/100ML-% Commonly known as:  FLAGYL Inject 500 mg into the vein every 8 (eight) hours. NaCl 0.79% Drip Rate : 100 ml/hr Give after dialysis and dialysis days   midodrine 2.5 MG tablet Commonly known as:  PROAMATINE Take 1 tablet (2.5 mg total) by mouth 3 (three) times daily with meals.   morphine 2 MG/ML injection Inject 2 mg into the vein every 8 (eight) hours as needed.   multivitamins ther. w/minerals Tabs tablet Take 1 tablet by mouth daily.   ondansetron 4 MG/5ML solution Commonly known as:  ZOFRAN Take 4 mg by mouth every 8 (eight) hours as needed for nausea or vomiting.   phenylephrine 20-0.9 MG/250ML-% Soln Commonly known as:  NEOSYNEPHRINE Inject 0-0.4 mg/min into the vein  continuous.   polyethylene glycol packet Commonly known as:  MIRALAX / GLYCOLAX Take 17 g by mouth daily.   potassium chloride 10 MEQ/100ML Inject 10 mEq into the vein as needed (Drip Rate 4.167).   sodium polystyrene 15 GM/60ML suspension Commonly known as:  KAYEXALATE Take 15-30 g by mouth as needed.      Allergies  Allergen Reactions  . Omnipaque [Iohexol] Nausea And Vomiting    Contrast Dye- Effects Kidney's      The results of significant diagnostics from this hospitalization (including imaging, microbiology, ancillary and laboratory) are listed below for reference.    Significant Diagnostic Studies: Ct Abdomen Pelvis Wo Contrast  Result Date: 11/08/2018 CLINICAL DATA:  Unspecified abdominal pain. EXAM: CT ABDOMEN AND PELVIS WITHOUT CONTRAST TECHNIQUE: Multidetector CT imaging of the abdomen and pelvis was performed following the standard protocol without IV contrast. COMPARISON:  CT pelvis 10/30/2018 FINDINGS: Lower chest: Consolidation or atelectasis in the right lung base. Small right pleural effusion. Cardiac enlargement. Coronary artery calcifications. Hepatobiliary: Multiple stones in the gallbladder. Gallbladder wall thickening with stranding in the pericholecystic fat. Changes likely to represent cholecystitis. No bile duct dilatation. No focal liver lesions. Pancreas: Unremarkable. No pancreatic ductal dilatation or surrounding inflammatory changes. Spleen: Normal in size without focal abnormality. Adrenals/Urinary Tract: No adrenal gland nodules. Multiple renal cysts on the right. No hydronephrosis or hydroureter. Bladder wall is not thickened. Stomach/Bowel: Stomach, small bowel, and colon are not abnormally distended. Enteric tube with tip in the stomach. Stool fills the colon. Vascular/Lymphatic: Prominent mesenteric and celiac axis vascular calcifications. No aneurysm. Reproductive: Uterus and ovaries are not enlarged. Gonadal vein phleboliths. Other: No free air or  free fluid in the abdomen. Abdominal wall musculature is lax without any discrete hernia. Musculoskeletal: Degenerative changes in the spine. No destructive bone lesions. IMPRESSION: 1. Cholelithiasis with inflammatory changes in the gallbladder consistent with acute cholecystitis. 2. Consolidation or atelectasis in the right lung base with small right pleural effusion. Aortic Atherosclerosis (ICD10-I70.0). Electronically Signed   By: Lucienne Capers M.D.   On: 11/08/2018 01:32   Dg Abd 1 View  Result Date: 11/04/2018 CLINICAL DATA:  Right lower quadrant abdominal pain EXAM: ABDOMEN - 1 VIEW COMPARISON:  11/01/2018 FINDINGS: Feeding tube appears to loop within the stomach. The left upper quadrant is not visualized and the  tip of the feeding tube is not visualized. Nonobstructive bowel gas pattern. Moderate stool burden throughout the colon. Study limited by body habitus. No visible free air. IMPRESSION: Moderate stool burden. No evidence of bowel obstruction or free air. Electronically Signed   By: Rolm Baptise M.D.   On: 11/04/2018 18:34   Ct Angio Chest Pe W Or Wo Contrast  Result Date: 10/25/2018 CLINICAL DATA:  Followup for cardiac arrest. Patient with a tracheostomy tube. EXAM: CT ANGIOGRAPHY CHEST WITH CONTRAST TECHNIQUE: Multidetector CT imaging of the chest was performed using the standard protocol during bolus administration of intravenous contrast. Multiplanar CT image reconstructions and MIPs were obtained to evaluate the vascular anatomy. CONTRAST:  68m ISOVUE-370 IOPAMIDOL (ISOVUE-370) INJECTION 76% COMPARISON:  Chest radiograph, 10/23/2018 and older exams. FINDINGS: Cardiovascular: The opacification of the pulmonary arteries beyond the branch points into the segmental pulmonary arteries is suboptimal. Pulmonary emboli cannot be excluded in the segmental and smaller branches. Allowing for this, there is no evidence of a large, central pulmonary embolus. Heart is mildly enlarged. No  pericardial effusion. There are coronary artery calcifications. No aortic dissection. No aortic atherosclerosis. Mediastinum/Nodes: Well-positioned tracheostomy tube. Nasal/orogastric tube passes into the stomach. No neck base, axillary, mediastinal or hilar masses or enlarged lymph nodes. Trachea is patent. Lungs/Pleura: There is dependent lower lobe atelectasis. Mild bilateral hazy ground-glass opacities are noted most evident in the upper lobes. This is likely due to the expiratory nature of fell to air trapping. Bilateral infection/inflammation is not excluded but felt less likely. 8 x 6 mm, mean 7 mm, pleural-based left upper lobe nodule, image 24, series 9. No other nodules. No pleural effusion.  No pneumothorax. Upper Abdomen: No acute abnormality. Musculoskeletal: No fracture or acute finding. There are degenerative changes along the thoracic spine. There are ill-defined areas of relative lucency, with multiple Schmorl's nodes. No definite lytic bone lesion. Review of the MIP images confirms the above findings. IMPRESSION: 1. Limited exam. Segmental and smaller pulmonary arteries not assessed for pulmonary emboli. Allowing for this, there is no large/central pulmonary embolus. 2. Dependent atelectasis in the lower lobes. Patchy areas of ground-glass opacity most evident in the upper lobes, most likely shunting related small airways disease in this expiratory study. Consider infection/inflammation if there are consistent clinical findings. No evidence of pulmonary edema. Electronically Signed   By: DLajean ManesM.D.   On: 10/25/2018 18:10   Ct Pelvis Wo Contrast  Result Date: 10/31/2018 CLINICAL DATA:  Postmenopausal bleeding. EXAM: CT PELVIS WITHOUT CONTRAST TECHNIQUE: Multidetector CT imaging of the pelvis was performed following the standard protocol without intravenous contrast. COMPARISON:  None. FINDINGS: Urinary Tract: No hydroureteronephrosis. The urinary bladder is unremarkable for the degree  of distention. Bowel: Enteric contrast reaches the rectum. No bowel obstruction is seen. Small and large bowel loops are seen within the patient's pannus pannus. Vascular/Lymphatic: Nonaneurysmal distal abdominal aorta with atherosclerosis of the common iliac arteries and branch vessels. Reproductive: Fibroid appearing uterus with intramural scattered coarse calcifications likely representing areas of fibroid degeneration. No adnexal mass. Other: Mild soft tissue anasarca. Layering gallstones of the included gallbladder without wall thickening or pericholecystic fluid. Musculoskeletal: No suspicious bone lesions identified. IMPRESSION: 1. No acute bowel obstruction or inflammation. 2. Fibroid appearing uterus. 3. Mild soft tissue anasarca. 4. Cholelithiasis without acute cholecystitis. Electronically Signed   By: DAshley RoyaltyM.D.   On: 10/31/2018 00:02   Ir Fluoro Guide Cv Line Left  Result Date: 10/26/2018 INDICATION: 57year old female with end-stage renal disease in  need of hemodialysis. She presents for placement of a tunneled hemodialysis catheter. EXAM: TUNNELED CENTRAL VENOUS HEMODIALYSIS CATHETER PLACEMENT WITH ULTRASOUND AND FLUOROSCOPIC GUIDANCE MEDICATIONS: Patient currently receiving cefepime as an inpatient. No additional antibiotic prophylaxis was administered. ANESTHESIA/SEDATION: Moderate (conscious) sedation was employed during this procedure. A total of Versed 1 mg and Fentanyl 50 mcg was administered intravenously. Moderate Sedation Time: 20 minutes. The patient's level of consciousness and vital signs were monitored continuously by radiology nursing throughout the procedure under my direct supervision. FLUOROSCOPY TIME:  Fluoroscopy Time: 0 minutes 42 seconds (9 mGy). COMPLICATIONS: None immediate. PROCEDURE: Informed written consent was obtained from the patient after a discussion of the risks, benefits, and alternatives to treatment. Questions regarding the procedure were encouraged and  answered. The left neck and chest were prepped with chlorhexidine in a sterile fashion, and a sterile drape was applied covering the operative field. Maximum barrier sterile technique with sterile gowns and gloves were used for the procedure. A timeout was performed prior to the initiation of the procedure. After creating a small venotomy incision, a micropuncture kit was utilized to access the left internal jugular vein under direct, real-time ultrasound guidance after the overlying soft tissues were anesthetized with 1% lidocaine with epinephrine. Ultrasound image documentation was performed. The microwire was kinked to measure appropriate catheter length. A stiff Glidewire was advanced to the level of the IVC and the micropuncture sheath was exchanged for a peel-away sheath. A palindrome tunneled hemodialysis catheter measuring 28 cm from tip to cuff was tunneled in a retrograde fashion from the anterior chest wall to the venotomy incision. The catheter was then placed through the peel-away sheath with tips ultimately positioned within the superior aspect of the right atrium. Final catheter positioning was confirmed and documented with a spot radiographic image. The catheter aspirates and flushes normally. The catheter was flushed with appropriate volume heparin dwells. The catheter exit site was secured with a 0-Prolene retention suture. The venotomy incision was sealed with Dermabond. Dressings were applied. The patient tolerated the procedure well without immediate post procedural complication. IMPRESSION: Successful placement of 28 cm tip to cuff tunneled hemodialysis catheter via the left internal jugular vein with tips terminating within the superior aspect of the right atrium. The catheter is ready for immediate use. Signed, Criselda Peaches, MD, [place] [facility] Radiology Electronically Signed   By: Jacqulynn Cadet M.D.   On: 10/26/2018 13:38   Ir US  Guide Vasc Access Left  Result Date: 10/26/2018 INDICATION: 57 year old female with end-stage renal disease in need of hemodialysis. She presents for placement of a tunneled hemodialysis catheter. EXAM: TUNNELED CENTRAL VENOUS HEMODIALYSIS CATHETER PLACEMENT WITH ULTRASOUND AND FLUOROSCOPIC GUIDANCE MEDICATIONS: Patient currently receiving cefepime as an inpatient. No additional antibiotic prophylaxis was administered. ANESTHESIA/SEDATION: Moderate (conscious) sedation was employed during this procedure. A total of Versed 1 mg and Fentanyl 50 mcg was administered intravenously. Moderate Sedation Time: 20 minutes. The patient's level of consciousness and vital signs were monitored continuously by radiology nursing throughout the procedure under my direct supervision. FLUOROSCOPY TIME:  Fluoroscopy Time: 0 minutes 42 seconds (9 mGy). COMPLICATIONS: None immediate. PROCEDURE: Informed written consent was obtained from the patient after a discussion of the risks, benefits, and alternatives to treatment. Questions regarding the procedure were encouraged and answered. The left neck and chest were prepped with chlorhexidine in a sterile fashion, and a sterile drape was applied covering the operative field. Maximum barrier sterile technique with sterile gowns and gloves were used  for the procedure. A timeout was performed prior to the initiation of the procedure. After creating a small venotomy incision, a micropuncture kit was utilized to access the left internal jugular vein under direct, real-time ultrasound guidance after the overlying soft tissues were anesthetized with 1% lidocaine with epinephrine. Ultrasound image documentation was performed. The microwire was kinked to measure appropriate catheter length. A stiff Glidewire was advanced to the level of the IVC and the micropuncture sheath was exchanged for a peel-away sheath. A palindrome tunneled hemodialysis catheter measuring 28 cm from tip to cuff was tunneled  in a retrograde fashion from the anterior chest wall to the venotomy incision. The catheter was then placed through the peel-away sheath with tips ultimately positioned within the superior aspect of the right atrium. Final catheter positioning was confirmed and documented with a spot radiographic image. The catheter aspirates and flushes normally. The catheter was flushed with appropriate volume heparin dwells. The catheter exit site was secured with a 0-Prolene retention suture. The venotomy incision was sealed with Dermabond. Dressings were applied. The patient tolerated the procedure well without immediate post procedural complication. IMPRESSION: Successful placement of 28 cm tip to cuff tunneled hemodialysis catheter via the left internal jugular vein with tips terminating within the superior aspect of the right atrium. The catheter is ready for immediate use. Signed, Criselda Peaches, MD, [place] [facility] Radiology Electronically Signed   By: Jacqulynn Cadet M.D.   On: 10/26/2018 13:38   Portable Chest Xray  Result Date: 11/09/2018 CLINICAL DATA:  Tracheostomy, respiratory failure EXAM: PORTABLE CHEST 1 VIEW COMPARISON:  11/05/2018 FINDINGS: Cardiomegaly with vascular congestion and bibasilar atelectasis. No overt edema. Support devices are stable. IMPRESSION: Cardiomegaly, vascular congestion and bibasilar atelectasis. Electronically Signed   By: Rolm Baptise M.D.   On: 11/09/2018 08:47   Dg Chest Port 1 View  Result Date: 11/05/2018 CLINICAL DATA:  Pneumonia EXAM: PORTABLE CHEST 1 VIEW COMPARISON:  11/01/2018 FINDINGS: Tracheostomy tube, feeding tube, and left subclavian dialysis catheter remain in place. Low lung volumes are present, causing crowding of the pulmonary vasculature. Interstitial accentuation persists. Indistinct right perihilar airspace opacity. Subsegmental atelectasis at the left lung base. No blunting of the costophrenic  angles. IMPRESSION: 1. Indistinct perihilar airspace opacity on the right, potentially from resolving edema. Interstitial accentuation noted bilaterally. Overall the appearance is mildly improved from 11/01/2018. 2. Low lung volumes. Electronically Signed   By: Van Clines M.D.   On: 11/05/2018 17:06   Dg Chest Port 1 View  Result Date: 11/01/2018 CLINICAL DATA:  Respiratory failure EXAM: PORTABLE CHEST 1 VIEW COMPARISON:  10/23/2018 FINDINGS: Endotracheal tube in good position. Right jugular central venous catheter removed. Interval placement of left jugular sent venous catheter tip at the cavoatrial junction. No pneumothorax Diffuse bilateral airspace disease unchanged, probable edema. IMPRESSION: Left jugular central venous catheter tip in the SVC at the cavoatrial junction. Diffuse bilateral airspace disease consistent with edema. Electronically Signed   By: Franchot Gallo M.D.   On: 11/01/2018 18:33   Dg Chest Port 1 View  Result Date: 10/23/2018 CLINICAL DATA:  Acute respiratory failure. EXAM: PORTABLE CHEST 1 VIEW COMPARISON:  Radiograph of October 19, 2018. FINDINGS: Stable cardiomegaly. Tracheostomy and nasogastric tubes are unchanged in position. Stable right internal jugular catheter is noted. Stable central pulmonary vascular congestion and possible pulmonary edema is noted. No pneumothorax or pleural effusion is noted. Bony thorax is unremarkable. IMPRESSION: Stable support apparatus. Stable cardiomegaly with central pulmonary vascular congestion and  possible pulmonary edema. Electronically Signed   By: Marijo Conception, M.D.   On: 10/23/2018 07:31   Dg Chest Port 1 View  Result Date: 10/19/2018 CLINICAL DATA:  Cardiac arrest. EXAM: PORTABLE CHEST 1 VIEW COMPARISON:  Chest x-ray from same day at 14:56. FINDINGS: Unchanged tracheostomy tube, enteric tube, and right internal jugular catheter. Stable cardiomegaly and central pulmonary vascular congestion. New hazy opacities throughout  both lungs. No pleural effusion or pneumothorax. No acute osseous abnormality. IMPRESSION: 1. New hazy opacities throughout both lungs, suspicious for pulmonary edema. Electronically Signed   By: Titus Dubin M.D.   On: 10/19/2018 16:20   Dg Chest Port 1 View  Result Date: 10/19/2018 CLINICAL DATA:  Post tracheostomy. EXAM: PORTABLE CHEST 1 VIEW COMPARISON:  None. FINDINGS: New tracheostomy tube projects over the upper trachea. No evidence of pneumomediastinum. No pneumothorax. Right IJ catheter tip terminates in the distal SVC. The left IJ approach central line has been removed. Gastric tube extends below the left hemidiaphragm. Lung volumes remain low. There is stable cardiomegaly with central vascular congestion and left basilar atelectasis. IMPRESSION: Low lung volumes with stable cardiomegaly and central vascular congestion. Support lines and tubes as above in satisfactory position. No pneumothorax or pneumomediastinum. Electronically Signed   By: Ashley Royalty M.D.   On: 10/19/2018 15:13   Dg Chest Port 1 View  Result Date: 10/18/2018 CLINICAL DATA:  Respiratory distress EXAM: PORTABLE CHEST 1 VIEW COMPARISON:  10/17/2018 FINDINGS: Endotracheal tube, nasogastric catheter and right and left jugular lines are again identified and stable. Increased vascular congestion is noted when compare with the prior exam. Mild left basilar atelectasis is noted increased from the prior study. No pneumothorax is seen. No bony abnormality is noted. IMPRESSION: Tubes and lines stable from the prior exam. No pneumothorax is noted. Increasing left basilar atelectasis. Increased vascular congestion is noted. Electronically Signed   By: Inez Catalina M.D.   On: 10/18/2018 08:42   Dg Chest Port 1 View  Result Date: 10/17/2018 CLINICAL DATA:  Hypoxia EXAM: PORTABLE CHEST 1 VIEW COMPARISON:  October 16, 2018 FINDINGS: Endotracheal tube tip is 4.3 cm above the carina. Nasogastric tube tip and side port below the diaphragm.  Right jugular catheter tip is in the superior vena cava. Left jugular catheter tip is at the junction of the left innominate vein and superior vena cava. No pneumothorax. There is mild bibasilar atelectasis. Lungs elsewhere are clear. The heart is upper normal in size with pulmonary vascularity normal. No adenopathy. No bone lesions. IMPRESSION: Tube and catheter positions as described without pneumothorax. Mild bibasilar atelectasis. No consolidation. Stable cardiac silhouette. Electronically Signed   By: Lowella Grip III M.D.   On: 10/17/2018 07:10   Dg Chest Port 1 View  Result Date: 10/16/2018 CLINICAL DATA:  Acute respiratory failure EXAM: PORTABLE CHEST 1 VIEW COMPARISON:  10/13/2018 FINDINGS: Endotracheal tube in good position and unchanged. Right jugular central venous catheter tip in the SVC. Left jugular central venous catheter tip in the SVC unchanged. NG tube enters the stomach with the tip not visualized. Mild bibasilar airspace disease has improved in the interval. Negative for edema or effusion. IMPRESSION: Support lines remain in good position. Bibasilar airspace disease with interval improvement. Electronically Signed   By: Franchot Gallo M.D.   On: 10/16/2018 09:40   Dg Chest Port 1 View  Result Date: 10/13/2018 CLINICAL DATA:  Hypoxia EXAM: PORTABLE CHEST 1 VIEW COMPARISON:  October 13, 2018 FINDINGS: Endotracheal tube tip is 3.7 cm above  the carina. Nasogastric tube tip and side port are below the diaphragm. Right jugular catheter tip is in the superior vena cava. Left jugular catheter tip is in the left innominate vein near the junction with the superior vena cava. No pneumothorax. There is atelectatic change in the right mid lung. There is a small focus of airspace consolidation in the medial right base. Lungs elsewhere are clear. There is cardiomegaly with pulmonary vascularity normal. No adenopathy. No bone lesions. IMPRESSION: Tube and catheter positions as described without  pneumothorax. Small focus of consolidation medial right base, felt to represent a focal area of pneumonia. Atelectasis right mid lung. Left lung clear. Heart mildly enlarged. Electronically Signed   By: Lowella Grip III M.D.   On: 10/13/2018 23:19   Dg Chest Port 1 View  Result Date: 10/13/2018 CLINICAL DATA:  57 year old female with respiratory failure status post cardiac arrest. EXAM: PORTABLE CHEST 1 VIEW COMPARISON:  10/12/2018 and earlier. FINDINGS: Portable AP semi upright view at 0504 hours. Stable endotracheal tube tip just below the clavicles. Enteric tube courses to the left abdomen, tip not included. Bilateral IJ approach central lines remain in place. Pacer or resuscitation pads project over the left chest. Mildly lower lung volumes and more kyphotic view. Stable cardiac size and mediastinal contours. Coarse and indistinct bilateral pulmonary interstitial opacity persists. No pneumothorax or definite pleural effusion. Paucity of bowel gas in the upper abdomen. IMPRESSION: 1.  Stable lines and tubes. 2. Persistent coarse bilateral pulmonary interstitial opacity. Top differential considerations include interstitial edema, atypical respiratory infection, ARDS. No pleural effusion is evident. Electronically Signed   By: Genevie Ann M.D.   On: 10/13/2018 07:45   Dg Chest Port 1 View  Result Date: 10/12/2018 CLINICAL DATA:  Hypoxia EXAM: PORTABLE CHEST 1 VIEW COMPARISON:  October 12, 2018 FINDINGS: Endotracheal tube tip is 5.5 cm above the carina. Nasogastric tube tip and side port are below the diaphragm. Right central catheter tip is in the superior vena cava. Left central catheter tip is in left innominate vein. No pneumothorax. There is no edema or consolidation. Heart is mildly enlarged with pulmonary vascularity normal. No adenopathy. No evident bone lesions. IMPRESSION: Tube and catheter positions as described without pneumothorax. No edema or consolidation. Mild cardiac enlargement.  Electronically Signed   By: Lowella Grip III M.D.   On: 10/12/2018 20:07   Dg Chest Port 1 View  Result Date: 10/12/2018 CLINICAL DATA:  Central line placement. EXAM: PORTABLE CHEST 1 VIEW COMPARISON:  10/12/2018. FINDINGS: Interim placement of right IJ line. Its tip is over the superior vena cava. Left IJ line in stable position. Endotracheal tube and NG tube in stable position. Cardiomegaly. Pulmonary vascular prominence. Mild right base infiltrate. No pleural effusion or pneumothorax. IMPRESSION: 1. Interim placement right IJ line, its tip is over the superior vena cava. Endotracheal tube, NG tube, left IJ line stable position. 2.  Cardiomegaly with pulmonary venous congestion. 3.  Right base infiltrate. Electronically Signed   By: Marcello Moores  Register   On: 10/12/2018 17:04   Dg Chest Port 1 View  Result Date: 10/12/2018 CLINICAL DATA:  Intubation.  Respiratory failure. EXAM: PORTABLE CHEST 1 VIEW COMPARISON:  10/09/2018. FINDINGS: Endotracheal tube has been withdrawn and its tip is now 2.7 cm above the carina. NG tube noted with tip below left hemidiaphragm. Left IJ line noted with tip over the proximal SVC. Cardiomegaly with bilateral pulmonary venous congestion and infiltrates/edema. No pleural effusion or pneumothorax. IMPRESSION: 1. Endotracheal tube has been withdrawn,  its tip is now 2.7 cm above the carina. NG tube and left IJ line stable position. 2. Cardiomegaly with bilateral pulmonary infiltrates/edema. Small left pleural effusion. Similar findings noted on prior exam. Electronically Signed   By: [place]   On: 10/12/2018 10:51   Dg Abd Portable 1v  Result Date: 11/07/2018 CLINICAL DATA:  57 y/o  F; feeding tube position. EXAM: PORTABLE ABDOMEN - 1 VIEW COMPARISON:  11/04/2018 abdomen radiographs. FINDINGS: Enteric tube tip projects over the proximal gastric body. Mild gastric distention. Moderate volume of stool in the colon. Bones are unremarkable. IMPRESSION: Enteric tube  tip projects over the proximal gastric body. Electronically Signed   By: Kristine Garbe M.D.   On: 11/07/2018 06:17   Dg Abd Portable 1v  Result Date: 11/01/2018 CLINICAL DATA:  Encounter for nasogastric tube placement. EXAM: PORTABLE ABDOMEN - 1 VIEW COMPARISON:  Chest radiograph 10/22/2018 FINDINGS: Single portable view of the abdomen was obtained. Patient is rotated on this examination. Feeding tube extends into the abdomen and appears to be coiled in the stomach. However, the tip of the tube is not visualized. IMPRESSION: Feeding tube extends into the abdomen but the tip is not visualized. Electronically Signed   By: Markus Daft M.D.   On: 11/01/2018 18:33   US Abdomen Limited Ruq  Result Date: 11/08/2018 CLINICAL DATA:  Right upper quadrant pain EXAM: ULTRASOUND ABDOMEN LIMITED RIGHT UPPER QUADRANT COMPARISON:  11/08/2018 CT FINDINGS: Gallbladder: Distended gallbladder with dependent calculi. Mural thickening up to 6 mm. Positive sonographic Murphy's is noted. The largest calcification measures 4.6 mm. There is biliary sludge admixed with the stones. Common bile duct: Diameter: 4.3 mm and normal without choledocholithiasis. Liver: Suboptimal assessment of the liver due to patient inability to move appropriately for optimal imaging. No definite space-occupying mass. Portal vein is patent on color Doppler imaging with normal direction of blood flow towards the liver. IMPRESSION: Findings suggest acute cholecystitis with biliary sludge and mobile calculi noted. Positive sonographic Murphy's with gallbladder distention and mural thickening of the gallbladder wall to 6 mm in thickness is noted. Electronically Signed   By: Ashley Royalty M.D.   On: 11/08/2018 19:24    Microbiology: Recent Results (from the past 240 hour(s))  Culture, blood (routine x 2)     Status: None (Preliminary result)   Collection Time: 11/07/18  9:27 AM  Result Value Ref Range Status   Specimen Description BLOOD RIGHT  HAND  Final   Special Requests   Final    BOTTLES DRAWN AEROBIC ONLY Blood Culture results may not be optimal due to an inadequate volume of blood received in culture bottles   Culture   Final    NO GROWTH 2 DAYS Performed at [facility] [address], [place], [place] [address]    Report Status PENDING  Incomplete  Culture, blood (routine x 2)     Status: None (Preliminary result)   Collection Time: 11/07/18 10:16 AM  Result Value Ref Range Status   Specimen Description BLOOD RIGHT HAND  Final   Special Requests   Final    BOTTLES DRAWN AEROBIC AND ANAEROBIC Blood Culture adequate volume   Culture   Final    NO GROWTH 2 DAYS Performed at [facility], [place] [address] [place], [place] [address]    Report Status PENDING  Incomplete     Labs: Basic Metabolic Panel: Recent Labs  Lab 11/05/18 0647 11/08/18 1121 11/09/18 0409  NA 132* 134* 135  K 4.2 4.8 4.6  CL 94* 96* 99  CO2 _0 GLUCOSE 136* 168* 131*  BUN 74* 47* 55*  CREATININE 7.00* 4.83* 5.64*  CALCIUM 9.6  9.7 9.6 9.6  MG  --   --  2.2  PHOS 5.4*  --  4.0   Liver Function Tests: Recent Labs  Lab 11/05/18 0647 11/08/18 1121  AST  --  115*  ALT  --  101*  ALKPHOS  --  354*  BILITOT  --  0.9  PROT  --  7.4  ALBUMIN 1.9* 1.6*   Recent Labs  Lab 11/08/18 1121  LIPASE 23  AMYLASE 44   No results for input(s): AMMONIA in the last 168 hours. CBC: Recent Labs  Lab 11/05/18 0647 11/06/18 0502 11/08/18 1121 11/09/18 0409  WBC 26.0* 25.9* 15.0* 18.3*  NEUTROABS  --   --  11.3*  --   HGB 7.6* 7.7* 7.4* 6.8*  HCT 26.4* 27.0* 25.9* 24.4*  MCV 87.7 89.7 88.7 90.0  PLT 284 263 259 311   Cardiac Enzymes: No results for input(s): CKTOTAL, CKMB, CKMBINDEX, TROPONINI in the last 168 hours. BNP: BNP (last 3 results) Recent Labs    05/13/18 0333  BNP 78.9    ProBNP (last 3 results) No results for input(s): PROBNP in the last 8760 hours.  CBG: Recent Labs  Lab 11/08/18 2354  11/09/18 0413 11/09/18 0725 11/09/18 1127 11/09/18 1513  GLUCAP 73 125* 137* 165* 172*       Signed:  Annita Brod, MD [facility] 11/09/2018, 4:20 PM

## 2018-11-09 NOTE — Sedation Documentation (Signed)
Vital signs stable. 

## 2018-11-10 DIAGNOSIS — N186 End stage renal disease: Secondary | ICD-10-CM

## 2018-11-10 DIAGNOSIS — Z992 Dependence on renal dialysis: Secondary | ICD-10-CM

## 2018-11-10 DIAGNOSIS — I469 Cardiac arrest, cause unspecified: Secondary | ICD-10-CM

## 2018-11-10 DIAGNOSIS — I5033 Acute on chronic diastolic (congestive) heart failure: Secondary | ICD-10-CM

## 2018-11-10 DIAGNOSIS — J189 Pneumonia, unspecified organism: Secondary | ICD-10-CM

## 2018-11-10 DIAGNOSIS — J9621 Acute and chronic respiratory failure with hypoxia: Secondary | ICD-10-CM

## 2018-11-10 DIAGNOSIS — N185 Chronic kidney disease, stage 5: Secondary | ICD-10-CM

## 2018-11-10 LAB — CBC
HCT: 27 % — ABNORMAL LOW (ref 36.0–46.0)
HEMOGLOBIN: 7.7 g/dL — AB (ref 12.0–15.0)
MCH: 25.8 pg — AB (ref 26.0–34.0)
MCHC: 28.5 g/dL — ABNORMAL LOW (ref 30.0–36.0)
MCV: 90.3 fL (ref 80.0–100.0)
NRBC: 1.3 % — AB (ref 0.0–0.2)
PLATELETS: 291 10*3/uL (ref 150–400)
RBC: 2.99 MIL/uL — AB (ref 3.87–5.11)
RDW: 16.1 % — ABNORMAL HIGH (ref 11.5–15.5)
WBC: 12.8 10*3/uL — ABNORMAL HIGH (ref 4.0–10.5)

## 2018-11-10 LAB — BPAM RBC
BLOOD PRODUCT EXPIRATION DATE: 201912192359
ISSUE DATE / TIME: 201911221037
UNIT TYPE AND RH: 5100

## 2018-11-10 LAB — BASIC METABOLIC PANEL
Anion gap: 15 (ref 5–15)
BUN: 80 mg/dL — AB (ref 6–20)
CO2: 20 mmol/L — AB (ref 22–32)
CREATININE: 6.48 mg/dL — AB (ref 0.44–1.00)
Calcium: 9.5 mg/dL (ref 8.9–10.3)
Chloride: 102 mmol/L (ref 98–111)
GFR calc Af Amer: 7 mL/min — ABNORMAL LOW (ref >60)
GFR calc non Af Amer: 6 mL/min — ABNORMAL LOW (ref >60)
GLUCOSE: 107 mg/dL — AB (ref 70–99)
POTASSIUM: 4.4 mmol/L (ref 3.5–5.1)
Sodium: 137 mmol/L (ref 135–145)

## 2018-11-10 LAB — TYPE AND SCREEN
ABO/RH(D): O POS
Antibody Screen: NEGATIVE
Unit division: 0

## 2018-11-10 NOTE — Progress Notes (Signed)
Pulmonary Critical Care Medicine [place]   PULMONARY CRITICAL CARE SERVICE  PROGRESS NOTE  Date of Service: 11/10/2018  Catherine Williams  CZY:[id number]  DOB: [birth year]   DOA: 11/09/2018  Referring Physician: Merton Border, MD  HPI: Catherine Williams is a 57 y.o. female seen for follow up of Acute on Chronic Respiratory Failure.  Patient is back from the acute side of right now is on a wean for pressure support of 12 hours  Medications: Reviewed on Rounds  Physical Exam:  Vitals: Temperature 98.7 pulse 70 respiratory rate 27 blood pressure 140/53 saturations 100%  Ventilator Settings mode of ventilation pressure support FiO2 28% tidal volume 302 pressure support 12 PEEP 5  . General: Comfortable at this time . Eyes: Grossly normal lids, irises & conjunctiva . ENT: grossly tongue is normal . Neck: no obvious mass . Cardiovascular: S1 S2 normal no gallop . Respiratory: No rhonchi or rales are noted . Abdomen: soft . Skin: no rash seen on limited exam . Musculoskeletal: not rigid . Psychiatric:unable to assess . Neurologic: no seizure no involuntary movements         Lab Data:   Basic Metabolic Panel: Recent Labs  Lab 11/05/18 0647 11/08/18 1121 11/09/18 0409 11/09/18 2217 11/10/18 0726  NA 132* 134* 135 136  136 137  K 4.2 4.8 4.6 4.6  4.6 4.4  CL 94* 96* 99 101  101 102  CO2 25 24 25  21*  21* 20*  GLUCOSE 136* 168* 131* 127*  127* 107*  BUN 74* 47* 55* 69*  69* 80*  CREATININE 7.00* 4.83* 5.64* 6.13*  6.20* 6.48*  CALCIUM 9.6  9.7 9.6 9.6 9.4  9.4 9.5  MG  --   --  2.2  --   --   PHOS 5.4*  --  4.0 5.0*  --     ABG: Recent Labs  Lab 11/08/18 2022  PHART 7.403  PCO2ART 43.1  PO2ART 107.0  HCO3 26.8  O2SAT 98.0    Liver Function Tests: Recent Labs  Lab 11/05/18 0647 11/08/18 1121 11/09/18 2217  AST  --  115*  --   ALT  --  101*  --   ALKPHOS  --  354*  --   BILITOT  --  0.9  --   PROT  --  7.4  --   ALBUMIN 1.9*  1.6* 1.5*   Recent Labs  Lab 11/08/18 1121  LIPASE 23  AMYLASE 44   No results for input(s): AMMONIA in the last 168 hours.  CBC: Recent Labs  Lab 11/06/18 0502 11/08/18 1121 11/09/18 0409 11/09/18 2217 11/10/18 0726  WBC 25.9* 15.0* 18.3* 13.9* 12.8*  NEUTROABS  --  11.3*  --   --   --   HGB 7.7* 7.4* 6.8* 7.7* 7.7*  HCT 27.0* 25.9* 24.4* 26.0* 27.0*  MCV 89.7 88.7 90.0 88.7 90.3  PLT 263 259 311 274 291    Cardiac Enzymes: No results for input(s): CKTOTAL, CKMB, CKMBINDEX, TROPONINI in the last 168 hours.  BNP (last 3 results) Recent Labs    05/13/18 0333  BNP 78.9    ProBNP (last 3 results) No results for input(s): PROBNP in the last 8760 hours.  Radiological Exams: Ir Perc Cholecystostomy  Result Date: 11/09/2018 INDICATION: Acute calculus cholecystitis EXAM: Percutaneous cholecystostomy MEDICATIONS: 3.375 g Zosyn; The antibiotic was administered within an appropriate time frame prior to the initiation of the procedure. ANESTHESIA/SEDATION: Moderate (conscious) sedation was employed during this procedure. A total  of Versed 1.5 mg and Fentanyl 75 mcg was administered intravenously. Moderate Sedation Time: 19 minutes. The patient's level of consciousness and vital signs were monitored continuously by radiology nursing throughout the procedure under my direct supervision. FLUOROSCOPY TIME:  Fluoroscopy Time: 0 minutes 18 seconds (4 mGy). COMPLICATIONS: None immediate. PROCEDURE: Informed written consent was obtained from the the patient and her family after a thorough discussion of the procedural risks, benefits and alternatives. All questions were addressed. Maximal Sterile Barrier Technique was utilized including caps, mask, sterile gowns, sterile gloves, sterile drape, hand hygiene and skin antiseptic. A timeout was performed prior to the initiation of the procedure. Previous imaging reviewed. Preliminary ultrasound performed. The distended thick-walled gallbladder was  localized in the right upper quadrant. Overlying skin marked for a transhepatic approach. Under sterile conditions and local anesthesia, a 21 gauge access needle was advanced percutaneously from a transhepatic approach into the gallbladder. Needle position confirmed with ultrasound. Images obtained for documentation. There was return of bile. Guidewire inserted followed by Accustick dilator set. Amplatz guidewire advanced. Tract dilatation performed to insert a 10 [place] drain. Drain catheter position confirmed with ultrasound and fluoroscopy. Syringe aspiration yielded 150 cc bile. Sample sent for culture. Catheter secured with Prolene suture and a sterile dressing. Gravity drainage bag connected. No immediate complication. Patient tolerated the procedure well. IMPRESSION: Successful ultrasound and fluoroscopic 10 French percutaneous cholecystostomy Electronically Signed   By: Jerilynn Mages.  Shick M.D.   On: 11/09/2018 17:09   Portable Chest Xray  Result Date: 11/09/2018 CLINICAL DATA:  Tracheostomy, respiratory failure EXAM: PORTABLE CHEST 1 VIEW COMPARISON:  11/05/2018 FINDINGS: Cardiomegaly with vascular congestion and bibasilar atelectasis. No overt edema. Support devices are stable. IMPRESSION: Cardiomegaly, vascular congestion and bibasilar atelectasis. Electronically Signed   By: Rolm Baptise M.D.   On: 11/09/2018 08:47   Dg Abd Portable 1v  Result Date: 11/09/2018 CLINICAL DATA:  Nasogastric tube placement. Recent cholecystostomy tube placement. EXAM: PORTABLE ABDOMEN - 1 VIEW COMPARISON:  Abdominal radiograph November 07, 2018 FINDINGS: Feeding tube tip projects in proximal stomach. Gas distended stomach. Included bowel gas pattern is nondilated and nonobstructive. Pigtail drainage catheter RIGHT mid abdomen. Calcified splenic artery. Soft tissue planes included osseous structures are unchanged, large body habitus. IMPRESSION: Feeding tube tip projects in mid stomach.  Gas distended stomach. RIGHT mid  abdomen pigtail catheter cholecystostomy tube. Electronically Signed   By: Elon Alas M.D.   On: 11/09/2018 21:30    Assessment/Plan Principal Problem:   Acute on chronic respiratory failure with hypoxia (HCC) Active Problems:   Acute on chronic diastolic CHF (congestive heart failure) (HCC)   CKD (chronic kidney disease) stage 5, GFR less than 15 ml/min (HCC)   Cardiac arrest (HCC)   End stage renal disease on dialysis ([place])   Healthcare-associated pneumonia   1. Acute on chronic respiratory failure with hypoxia we will continue to wean on pressure support mode the goal as mentioned is for 12 hours today. 2. Cardiac arrest rhythm is stable we will continue with present management. 3. End-stage renal disease followed by nephrology for dialysis continue with supportive care 4. Chronic diastolic heart failure we will continue with present management monitor fluid status 5. Healthcare associated pneumonia treated we will continue to monitor. 6. Retained tracheal secretions continue with aggressive pulmonary toilet   I have personally seen and evaluated the patient, evaluated laboratory and imaging results, formulated the assessment and plan and placed orders. The Patient requires high complexity decision making for assessment and support.  Case was  discussed on Rounds with the Respiratory Therapy Staff  Allyne Gee, MD [facility] Pulmonary Critical Care Medicine Sleep Medicine

## 2018-11-11 ENCOUNTER — Other Ambulatory Visit (HOSPITAL_COMMUNITY): Payer: Self-pay

## 2018-11-11 DIAGNOSIS — Z4682 Encounter for fitting and adjustment of non-vascular catheter: Secondary | ICD-10-CM | POA: Diagnosis not present

## 2018-11-11 DIAGNOSIS — R140 Abdominal distension (gaseous): Secondary | ICD-10-CM | POA: Diagnosis not present

## 2018-11-11 DIAGNOSIS — R0989 Other specified symptoms and signs involving the circulatory and respiratory systems: Secondary | ICD-10-CM | POA: Diagnosis not present

## 2018-11-11 DIAGNOSIS — Z452 Encounter for adjustment and management of vascular access device: Secondary | ICD-10-CM | POA: Diagnosis not present

## 2018-11-11 NOTE — Progress Notes (Signed)
Pulmonary Critical Care Medicine [place]   PULMONARY CRITICAL CARE SERVICE  PROGRESS NOTE  Date of Service: 11/11/2018  Catherine Williams  WUJ:[id number]  DOB: [birth year]   DOA: 11/09/2018  Referring Physician: Merton Border, MD  HPI: Catherine Williams is a 57 y.o. female seen for follow up of Acute on Chronic Respiratory Failure.  Patient is weaning on pressure support with a 16-hour goal current FiO2 28% tidal volume 530 pressure support of 12 PEEP of 5.  Medications: Reviewed on Rounds  Physical Exam:  Vitals: Temperature 99.3 pulse 70 respirations 26 blood pressure 136/76 O2 saturation 98%  Ventilator Settings mode of ventilation pressure support FiO2 28% tidal volume 530 pressure support of 12 PEEP of 5.  . General: Comfortable at this time . Eyes: Grossly normal lids, irises & conjunctiva . ENT: grossly tongue is normal . Neck: no obvious mass . Cardiovascular: S1 S2 normal no gallop . Respiratory: Coarse breath sounds throughout. . Abdomen: soft . Skin: no rash seen on limited exam . Musculoskeletal: not rigid . Psychiatric:unable to assess . Neurologic: no seizure no involuntary movements         Lab Data:   Basic Metabolic Panel: Recent Labs  Lab 11/05/18 0647 11/08/18 1121 11/09/18 0409 11/09/18 2217 11/10/18 0726  NA 132* 134* 135 136  136 137  K 4.2 4.8 4.6 4.6  4.6 4.4  CL 94* 96* 99 101  101 102  CO2 25 24 25  21*  21* 20*  GLUCOSE 136* 168* 131* 127*  127* 107*  BUN 74* 47* 55* 69*  69* 80*  CREATININE 7.00* 4.83* 5.64* 6.13*  6.20* 6.48*  CALCIUM 9.6  9.7 9.6 9.6 9.4  9.4 9.5  MG  --   --  2.2  --   --   PHOS 5.4*  --  4.0 5.0*  --     ABG: Recent Labs  Lab 11/08/18 2022  PHART 7.403  PCO2ART 43.1  PO2ART 107.0  HCO3 26.8  O2SAT 98.0    Liver Function Tests: Recent Labs  Lab 11/05/18 0647 11/08/18 1121 11/09/18 2217  AST  --  115*  --   ALT  --  101*  --   ALKPHOS  --  354*  --   BILITOT  --  0.9  --    PROT  --  7.4  --   ALBUMIN 1.9* 1.6* 1.5*   Recent Labs  Lab 11/08/18 1121  LIPASE 23  AMYLASE 44   No results for input(s): AMMONIA in the last 168 hours.  CBC: Recent Labs  Lab 11/06/18 0502 11/08/18 1121 11/09/18 0409 11/09/18 2217 11/10/18 0726  WBC 25.9* 15.0* 18.3* 13.9* 12.8*  NEUTROABS  --  11.3*  --   --   --   HGB 7.7* 7.4* 6.8* 7.7* 7.7*  HCT 27.0* 25.9* 24.4* 26.0* 27.0*  MCV 89.7 88.7 90.0 88.7 90.3  PLT 263 259 311 274 291    Cardiac Enzymes: No results for input(s): CKTOTAL, CKMB, CKMBINDEX, TROPONINI in the last 168 hours.  BNP (last 3 results) Recent Labs    05/13/18 0333  BNP 78.9    ProBNP (last 3 results) No results for input(s): PROBNP in the last 8760 hours.  Radiological Exams: Ir Perc Cholecystostomy  Result Date: 11/09/2018 INDICATION: Acute calculus cholecystitis EXAM: Percutaneous cholecystostomy MEDICATIONS: 3.375 g Zosyn; The antibiotic was administered within an appropriate time frame prior to the initiation of the procedure. ANESTHESIA/SEDATION: Moderate (conscious) sedation was employed during this  procedure. A total of Versed 1.5 mg and Fentanyl 75 mcg was administered intravenously. Moderate Sedation Time: 19 minutes. The patient's level of consciousness and vital signs were monitored continuously by radiology nursing throughout the procedure under my direct supervision. FLUOROSCOPY TIME:  Fluoroscopy Time: 0 minutes 18 seconds (4 mGy). COMPLICATIONS: None immediate. PROCEDURE: Informed written consent was obtained from the the patient and her family after a thorough discussion of the procedural risks, benefits and alternatives. All questions were addressed. Maximal Sterile Barrier Technique was utilized including caps, mask, sterile gowns, sterile gloves, sterile drape, hand hygiene and skin antiseptic. A timeout was performed prior to the initiation of the procedure. Previous imaging reviewed. Preliminary ultrasound performed. The  distended thick-walled gallbladder was localized in the right upper quadrant. Overlying skin marked for a transhepatic approach. Under sterile conditions and local anesthesia, a 21 gauge access needle was advanced percutaneously from a transhepatic approach into the gallbladder. Needle position confirmed with ultrasound. Images obtained for documentation. There was return of bile. Guidewire inserted followed by Accustick dilator set. Amplatz guidewire advanced. Tract dilatation performed to insert a 10 [place] drain. Drain catheter position confirmed with ultrasound and fluoroscopy. Syringe aspiration yielded 150 cc bile. Sample sent for culture. Catheter secured with Prolene suture and a sterile dressing. Gravity drainage bag connected. No immediate complication. Patient tolerated the procedure well. IMPRESSION: Successful ultrasound and fluoroscopic 10 French percutaneous cholecystostomy Electronically Signed   By: Jerilynn Mages.  Shick M.D.   On: 11/09/2018 17:09   Dg Abd Portable 1v  Result Date: 11/09/2018 CLINICAL DATA:  Nasogastric tube placement. Recent cholecystostomy tube placement. EXAM: PORTABLE ABDOMEN - 1 VIEW COMPARISON:  Abdominal radiograph November 07, 2018 FINDINGS: Feeding tube tip projects in proximal stomach. Gas distended stomach. Included bowel gas pattern is nondilated and nonobstructive. Pigtail drainage catheter RIGHT mid abdomen. Calcified splenic artery. Soft tissue planes included osseous structures are unchanged, large body habitus. IMPRESSION: Feeding tube tip projects in mid stomach.  Gas distended stomach. RIGHT mid abdomen pigtail catheter cholecystostomy tube. Electronically Signed   By: Elon Alas M.D.   On: 11/09/2018 21:30    Assessment/Plan Principal Problem:   Acute on chronic respiratory failure with hypoxia (HCC) Active Problems:   Acute on chronic diastolic CHF (congestive heart failure) (HCC)   CKD (chronic kidney disease) stage 5, GFR less than 15 ml/min (HCC)    Cardiac arrest (HCC)   End stage renal disease on dialysis ([place])   Healthcare-associated pneumonia   1. Acute on chronic respiratory failure with hypoxia continue weaning protocol on current pressure support mode.  Current goal is increased to 16 hours today. 2. Cardiac arrest rhythm is stable continue present management. 3. End-stage renal disease currently followed by nephrology continue dialysis and supportive care. 4. Chronic diastolic heart failure continue present management and monitor fluid status. 5. Healthcare associated pneumonia resolved, chest x-ray reviewed. 6. Retained tracheal secretions, continue with aggressive pulmonary toilet.   I have personally seen and evaluated the patient, evaluated laboratory and imaging results, formulated the assessment and plan and placed orders. The Patient requires high complexity decision making for assessment and support.  Case was discussed on Rounds with the Respiratory Therapy Staff  Allyne Gee, MD [facility] Pulmonary Critical Care Medicine Sleep Medicine

## 2018-11-12 ENCOUNTER — Encounter: Payer: Medicare Other | Admitting: Surgery

## 2018-11-12 ENCOUNTER — Inpatient Hospital Stay (HOSPITAL_COMMUNITY): Admission: RE | Admit: 2018-11-12 | Payer: Medicare Other | Source: Ambulatory Visit

## 2018-11-12 LAB — CULTURE, BLOOD (ROUTINE X 2)
CULTURE: NO GROWTH
Culture: NO GROWTH
Special Requests: ADEQUATE

## 2018-11-12 LAB — RENAL FUNCTION PANEL
ALBUMIN: 1.6 g/dL — AB (ref 3.5–5.0)
Anion gap: 14 (ref 5–15)
BUN: 84 mg/dL — ABNORMAL HIGH (ref 6–20)
CALCIUM: 9.1 mg/dL (ref 8.9–10.3)
CO2: 23 mmol/L (ref 22–32)
CREATININE: 6.31 mg/dL — AB (ref 0.44–1.00)
Chloride: 99 mmol/L (ref 98–111)
GFR calc Af Amer: 8 mL/min — ABNORMAL LOW (ref >60)
GFR, EST NON AFRICAN AMERICAN: 7 mL/min — AB (ref >60)
GLUCOSE: 126 mg/dL — AB (ref 70–99)
PHOSPHORUS: 5.6 mg/dL — AB (ref 2.5–4.6)
POTASSIUM: 4.1 mmol/L (ref 3.5–5.1)
SODIUM: 136 mmol/L (ref 135–145)

## 2018-11-12 LAB — CBC
HEMATOCRIT: 28.6 % — AB (ref 36.0–46.0)
Hemoglobin: 8.2 g/dL — ABNORMAL LOW (ref 12.0–15.0)
MCH: 25.8 pg — ABNORMAL LOW (ref 26.0–34.0)
MCHC: 28.7 g/dL — ABNORMAL LOW (ref 30.0–36.0)
MCV: 89.9 fL (ref 80.0–100.0)
NRBC: 0.4 % — AB (ref 0.0–0.2)
PLATELETS: 244 10*3/uL (ref 150–400)
RBC: 3.18 MIL/uL — ABNORMAL LOW (ref 3.87–5.11)
RDW: 16.3 % — AB (ref 11.5–15.5)
WBC: 14.2 10*3/uL — AB (ref 4.0–10.5)

## 2018-11-12 NOTE — Progress Notes (Signed)
Pulmonary Critical Care Medicine [place]   PULMONARY CRITICAL CARE SERVICE  PROGRESS NOTE  Date of Service: 11/12/2018  KATHLYN LEACHMAN  JYN:[id number]  DOB: [birth year]   DOA: 11/09/2018  Referring Physician: Merton Border, MD  HPI: Catherine Williams is a 57 y.o. female seen for follow up of Acute on Chronic Respiratory Failure.  Patient is on T collar currently on 28% oxygen goal of 2-hour wean today  Medications: Reviewed on Rounds  Physical Exam:  Vitals: Temperature 98.0 pulse 69 respiratory rate 20 blood pressure 129/53 saturations 98%  Ventilator Settings off the ventilator on T collar  . General: Comfortable at this time . Eyes: Grossly normal lids, irises & conjunctiva . ENT: grossly tongue is normal . Neck: no obvious mass . Cardiovascular: S1 S2 normal no gallop . Respiratory: No rhonchi or rales are noted . Abdomen: soft . Skin: no rash seen on limited exam . Musculoskeletal: not rigid . Psychiatric:unable to assess . Neurologic: no seizure no involuntary movements         Lab Data:   Basic Metabolic Panel: Recent Labs  Lab 11/08/18 1121 11/09/18 0409 11/09/18 2217 11/10/18 0726  NA 134* 135 136  136 137  K 4.8 4.6 4.6  4.6 4.4  CL 96* 99 101  101 102  CO2 24 25 21*  21* 20*  GLUCOSE 168* 131* 127*  127* 107*  BUN 47* 55* 69*  69* 80*  CREATININE 4.83* 5.64* 6.13*  6.20* 6.48*  CALCIUM 9.6 9.6 9.4  9.4 9.5  MG  --  2.2  --   --   PHOS  --  4.0 5.0*  --     ABG: Recent Labs  Lab 11/08/18 2022  PHART 7.403  PCO2ART 43.1  PO2ART 107.0  HCO3 26.8  O2SAT 98.0    Liver Function Tests: Recent Labs  Lab 11/08/18 1121 11/09/18 2217  AST 115*  --   ALT 101*  --   ALKPHOS 354*  --   BILITOT 0.9  --   PROT 7.4  --   ALBUMIN 1.6* 1.5*   Recent Labs  Lab 11/08/18 1121  LIPASE 23  AMYLASE 44   No results for input(s): AMMONIA in the last 168 hours.  CBC: Recent Labs  Lab 11/06/18 0502 11/08/18 1121  11/09/18 0409 11/09/18 2217 11/10/18 0726  WBC 25.9* 15.0* 18.3* 13.9* 12.8*  NEUTROABS  --  11.3*  --   --   --   HGB 7.7* 7.4* 6.8* 7.7* 7.7*  HCT 27.0* 25.9* 24.4* 26.0* 27.0*  MCV 89.7 88.7 90.0 88.7 90.3  PLT 263 259 311 274 291    Cardiac Enzymes: No results for input(s): CKTOTAL, CKMB, CKMBINDEX, TROPONINI in the last 168 hours.  BNP (last 3 results) Recent Labs    05/13/18 0333  BNP 78.9    ProBNP (last 3 results) No results for input(s): PROBNP in the last 8760 hours.  Radiological Exams: Dg Chest Port 1 View  Result Date: 11/11/2018 CLINICAL DATA:  Follow-up central venous line placement. EXAM: PORTABLE CHEST 1 VIEW COMPARISON:  Chest radiograph November 09, 2018 FINDINGS: RIGHT internal jugular central venous catheter distal tip projects in RIGHT atrium. Tunneled dialysis catheter via LEFT subclavian venous approach with distal tip projecting distal superior vena cava. Tracheostomy tube in place. Feeding tube past GE junction, distal tip out of field of view. Stable cardiomegaly and pulmonary vascular congestion. No pleural effusion or focal consolidation. No pneumothorax. Soft tissue planes and included osseous  structures are unchanged. IMPRESSION: 1. RIGHT internal jugular central venous catheter tip projects in RIGHT atrium, recommend 2 cm retraction. Otherwise stable appearance of life support lines. 2. Stable cardiomegaly and pulmonary vascular congestion. Electronically Signed   By: Elon Alas M.D.   On: 11/11/2018 21:09    Assessment/Plan Principal Problem:   Acute on chronic respiratory failure with hypoxia (HCC) Active Problems:   Acute on chronic diastolic CHF (congestive heart failure) (HCC)   CKD (chronic kidney disease) stage 5, GFR less than 15 ml/min (HCC)   Cardiac arrest (HCC)   End stage renal disease on dialysis ([place])   Healthcare-associated pneumonia   1. Acute on chronic respiratory failure with hypoxia patient continues to wean on T  collar as mentioned today goal was 2 hours 2. Acute on chronic diastolic heart failure on dialysis will need fluid removal patient had issues with fluid overload. 3. Acute on chronic renal failure now on dialysis for end-stage renal disease we will continue with supportive care 4. Cardiac arrest rhythm is stable 5. End-stage renal disease on dialysis 6. Healthcare associated pneumonia treated we will monitor   I have personally seen and evaluated the patient, evaluated laboratory and imaging results, formulated the assessment and plan and placed orders. The Patient requires high complexity decision making for assessment and support.  Case was discussed on Rounds with the Respiratory Therapy Staff  Allyne Gee, MD [facility] Pulmonary Critical Care Medicine Sleep Medicine

## 2018-11-12 NOTE — Progress Notes (Signed)
[facility]  ROUNDING NOTE   Subjective:  Patient has now returned to [employer]. Still has acute respiratory failure and on the ventilator. Due for dialysis today.  Objective:  Vital signs in last 24 hours:  Temperature 98 pulse 69 respirations 20 blood pressure 129/53  Physical Exam: General: Critically ill appearing  Head: Normocephalic, atraumatic. Moist oral mucosal membranes  Eyes: Anicteric  Neck: Tracheostomy in place  Lungs:  Scattered rhonchi, normal effort  Heart: S1S2 no rubs  Abdomen:  Soft, nontender, bowel sounds present  Extremities: Trace peripheral edema.  Neurologic: Awake, alert, following commands  Skin: No lesions  Access: LUE AVF, L IJ PC    Basic Metabolic Panel: Recent Labs  Lab 11/08/18 1121 11/09/18 0409 11/09/18 2217 11/10/18 0726  NA 134* 135 136  136 137  K 4.8 4.6 4.6  4.6 4.4  CL 96* 99 101  101 102  CO2 24 25 21*  21* 20*  GLUCOSE 168* 131* 127*  127* 107*  BUN 47* 55* 69*  69* 80*  CREATININE 4.83* 5.64* 6.13*  6.20* 6.48*  CALCIUM 9.6 9.6 9.4  9.4 9.5  MG  --  2.2  --   --   PHOS  --  4.0 5.0*  --     Liver Function Tests: Recent Labs  Lab 11/08/18 1121 11/09/18 2217  AST 115*  --   ALT 101*  --   ALKPHOS 354*  --   BILITOT 0.9  --   PROT 7.4  --   ALBUMIN 1.6* 1.5*   Recent Labs  Lab 11/08/18 1121  LIPASE 23  AMYLASE 44   No results for input(s): AMMONIA in the last 168 hours.  CBC: Recent Labs  Lab 11/06/18 0502 11/08/18 1121 11/09/18 0409 11/09/18 2217 11/10/18 0726  WBC 25.9* 15.0* 18.3* 13.9* 12.8*  NEUTROABS  --  11.3*  --   --   --   HGB 7.7* 7.4* 6.8* 7.7* 7.7*  HCT 27.0* 25.9* 24.4* 26.0* 27.0*  MCV 89.7 88.7 90.0 88.7 90.3  PLT 263 259 311 274 291    Cardiac Enzymes: No results for input(s): CKTOTAL, CKMB, CKMBINDEX, TROPONINI in the last 168 hours.  BNP: Invalid input(s): POCBNP  CBG: Recent Labs  Lab 11/08/18 2354 11/09/18 0413 11/09/18 0725  11/09/18 1127 11/09/18 1513  GLUCAP 73 125* 137* 165* 172*    Microbiology: Results for orders placed or performed during the hospital encounter of 11/08/18  Aerobic/Anaerobic Culture (surgical/deep wound)     Status: None (Preliminary result)   Collection Time: 11/09/18  5:02 PM  Result Value Ref Range Status   Specimen Description GALL BLADDER  Final   Special Requests Normal  Final   Gram Stain NO WBC SEEN NO ORGANISMS SEEN   Final   Culture   Final    NO GROWTH 2 DAYS NO ANAEROBES ISOLATED; CULTURE IN PROGRESS FOR 5 DAYS Performed at [facility], [place] [address], [place], [place] [address]    Report Status PENDING  Incomplete    Coagulation Studies: No results for input(s): LABPROT, INR in the last 72 hours.  Urinalysis: No results for input(s): COLORURINE, LABSPEC, PHURINE, GLUCOSEU, HGBUR, BILIRUBINUR, KETONESUR, PROTEINUR, UROBILINOGEN, NITRITE, LEUKOCYTESUR in the last 72 hours.  Invalid input(s): APPERANCEUR    Imaging: Dg Chest Port 1 View  Result Date: 11/11/2018 CLINICAL DATA:  Follow-up central venous line placement. EXAM: PORTABLE CHEST 1 VIEW COMPARISON:  Chest radiograph November 09, 2018 FINDINGS: RIGHT internal jugular central venous  catheter distal tip projects in RIGHT atrium. Tunneled dialysis catheter via LEFT subclavian venous approach with distal tip projecting distal superior vena cava. Tracheostomy tube in place. Feeding tube past GE junction, distal tip out of field of view. Stable cardiomegaly and pulmonary vascular congestion. No pleural effusion or focal consolidation. No pneumothorax. Soft tissue planes and included osseous structures are unchanged. IMPRESSION: 1. RIGHT internal jugular central venous catheter tip projects in RIGHT atrium, recommend 2 cm retraction. Otherwise stable appearance of life support lines. 2. Stable cardiomegaly and pulmonary vascular congestion. Electronically Signed   By: Elon Alas M.D.   On: 11/11/2018  21:09     Medications:       Assessment/ Plan:  57 y.o. female with a PMHx of recurrent V. fib/PEA arrest, recent acute respiratory failure, COPD, obstructive sleep apnea, diastolic heart failure, multifactorial anemia, diabetes mellitus type 2, who was admitted to [facility] on 11/01/2018 for ongoing care.    1.  Acute renal failure/chronic kidney disease stage IV.  Patient dialysis dependent since late October.   -Creatinine remains high at this point time.  We plan for hemodialysis today.  Orders to be prepared.  2.  Acute respiratory failure.  Prior to currently 28%.  Continue ventilatory support.  3.  Anemia chronic kidney disease.     Hemoglobin still low at 7.7.  Consider blood transfusion for hemoglobin of 7 or less.  4.  Secondary hyperparathyroidism.  Phosphorus 5.0 at last check.  This should come down with dialysis today.   LOS: 0 Athanasius Kesling 11/25/20198:28 AM

## 2018-11-13 ENCOUNTER — Encounter: Payer: Self-pay | Admitting: Surgery

## 2018-11-13 NOTE — Progress Notes (Signed)
Pulmonary Critical Care Medicine [place]   PULMONARY CRITICAL CARE SERVICE  PROGRESS NOTE  Date of Service: 11/13/2018  Catherine Williams  TSV:[id number]  DOB: 09-04-1961   DOA: 11/09/2018  Referring Physician: Merton Border, MD  HPI: Catherine Williams is a 57 y.o. female seen for follow up of Acute on Chronic Respiratory Failure.  Patient remains on T collar is doing fairly well.  Has had no major issues noted overnight.  Medications: Reviewed on Rounds  Physical Exam:  Vitals: Temperature 98.7 pulse 71 respiratory 15 blood pressure 118/68 saturations 98%  Ventilator Settings currently off the ventilator on T collar right now  . General: Comfortable at this time . Eyes: Grossly normal lids, irises & conjunctiva . ENT: grossly tongue is normal . Neck: no obvious mass . Cardiovascular: S1 S2 normal no gallop . Respiratory: No rhonchi or rales are noted . Abdomen: soft . Skin: no rash seen on limited exam . Musculoskeletal: not rigid . Psychiatric:unable to assess . Neurologic: no seizure no involuntary movements         Lab Data:   Basic Metabolic Panel: Recent Labs  Lab 11/08/18 1121 11/09/18 0409 11/09/18 2217 11/10/18 0726 11/12/18 0958  NA 134* 135 136  136 137 136  K 4.8 4.6 4.6  4.6 4.4 4.1  CL 96* 99 101  101 102 99  CO2 24 25 21*  21* 20* 23  GLUCOSE 168* 131* 127*  127* 107* 126*  BUN 47* 55* 69*  69* 80* 84*  CREATININE 4.83* 5.64* 6.13*  6.20* 6.48* 6.31*  CALCIUM 9.6 9.6 9.4  9.4 9.5 9.1  MG  --  2.2  --   --   --   PHOS  --  4.0 5.0*  --  5.6*    ABG: Recent Labs  Lab 11/08/18 2022  PHART 7.403  PCO2ART 43.1  PO2ART 107.0  HCO3 26.8  O2SAT 98.0    Liver Function Tests: Recent Labs  Lab 11/08/18 1121 11/09/18 2217 11/12/18 0958  AST 115*  --   --   ALT 101*  --   --   ALKPHOS 354*  --   --   BILITOT 0.9  --   --   PROT 7.4  --   --   ALBUMIN 1.6* 1.5* 1.6*   Recent Labs  Lab 11/08/18 1121  LIPASE  23  AMYLASE 44   No results for input(s): AMMONIA in the last 168 hours.  CBC: Recent Labs  Lab 11/08/18 1121 11/09/18 0409 11/09/18 2217 11/10/18 0726 11/12/18 0958  WBC 15.0* 18.3* 13.9* 12.8* 14.2*  NEUTROABS 11.3*  --   --   --   --   HGB 7.4* 6.8* 7.7* 7.7* 8.2*  HCT 25.9* 24.4* 26.0* 27.0* 28.6*  MCV 88.7 90.0 88.7 90.3 89.9  PLT 259 311 274 291 244    Cardiac Enzymes: No results for input(s): CKTOTAL, CKMB, CKMBINDEX, TROPONINI in the last 168 hours.  BNP (last 3 results) Recent Labs    05/13/18 0333  BNP 78.9    ProBNP (last 3 results) No results for input(s): PROBNP in the last 8760 hours.  Radiological Exams: Dg Chest Port 1 View  Result Date: 11/11/2018 CLINICAL DATA:  Follow-up central venous line placement. EXAM: PORTABLE CHEST 1 VIEW COMPARISON:  Chest radiograph November 09, 2018 FINDINGS: RIGHT internal jugular central venous catheter distal tip projects in RIGHT atrium. Tunneled dialysis catheter via LEFT subclavian venous approach with distal tip projecting distal superior vena  cava. Tracheostomy tube in place. Feeding tube past GE junction, distal tip out of field of view. Stable cardiomegaly and pulmonary vascular congestion. No pleural effusion or focal consolidation. No pneumothorax. Soft tissue planes and included osseous structures are unchanged. IMPRESSION: 1. RIGHT internal jugular central venous catheter tip projects in RIGHT atrium, recommend 2 cm retraction. Otherwise stable appearance of life support lines. 2. Stable cardiomegaly and pulmonary vascular congestion. Electronically Signed   By: Elon Alas M.D.   On: 11/11/2018 21:09    Assessment/Plan Principal Problem:   Acute on chronic respiratory failure with hypoxia (HCC) Active Problems:   Acute on chronic diastolic CHF (congestive heart failure) (HCC)   CKD (chronic kidney disease) stage 5, GFR less than 15 ml/min (HCC)   Cardiac arrest (HCC)   End stage renal disease on  dialysis ([place])   Healthcare-associated pneumonia   1. Acute on chronic respiratory failure with hypoxia we will continue with T collar titrate oxygen as tolerated continue pulmonary toilet. 2. Acute on chronic diastolic heart failure compensated we will continue to monitor. 3. Chronic kidney disease end-stage patient has had catheter placed getting dialyzed now 4. Cardiac arrest rhythm is stable 5. End-stage renal disease as above on dialysis no 6. Healthcare associated pneumonia we will continue to monitor   I have personally seen and evaluated the patient, evaluated laboratory and imaging results, formulated the assessment and plan and placed orders. The Patient requires high complexity decision making for assessment and support.  Case was discussed on Rounds with the Respiratory Therapy Staff  Allyne Gee, MD [facility] Pulmonary Critical Care Medicine Sleep Medicine

## 2018-11-14 LAB — RENAL FUNCTION PANEL
ANION GAP: 12 (ref 5–15)
Albumin: 1.8 g/dL — ABNORMAL LOW (ref 3.5–5.0)
BUN: 59 mg/dL — AB (ref 6–20)
CHLORIDE: 98 mmol/L (ref 98–111)
CO2: 24 mmol/L (ref 22–32)
Calcium: 9.3 mg/dL (ref 8.9–10.3)
Creatinine, Ser: 5.27 mg/dL — ABNORMAL HIGH (ref 0.44–1.00)
GFR calc Af Amer: 10 mL/min — ABNORMAL LOW (ref >60)
GFR calc non Af Amer: 8 mL/min — ABNORMAL LOW (ref >60)
Glucose, Bld: 138 mg/dL — ABNORMAL HIGH (ref 70–99)
PHOSPHORUS: 5.9 mg/dL — AB (ref 2.5–4.6)
POTASSIUM: 3.9 mmol/L (ref 3.5–5.1)
Sodium: 134 mmol/L — ABNORMAL LOW (ref 135–145)

## 2018-11-14 LAB — CBC
HEMATOCRIT: 29.5 % — AB (ref 36.0–46.0)
HEMOGLOBIN: 8.2 g/dL — AB (ref 12.0–15.0)
MCH: 25.7 pg — ABNORMAL LOW (ref 26.0–34.0)
MCHC: 27.8 g/dL — AB (ref 30.0–36.0)
MCV: 92.5 fL (ref 80.0–100.0)
NRBC: 0.3 % — AB (ref 0.0–0.2)
Platelets: 245 10*3/uL (ref 150–400)
RBC: 3.19 MIL/uL — ABNORMAL LOW (ref 3.87–5.11)
RDW: 16.3 % — AB (ref 11.5–15.5)
WBC: 16.5 10*3/uL — ABNORMAL HIGH (ref 4.0–10.5)

## 2018-11-14 NOTE — Telephone Encounter (Signed)
I have checked RA's folders up front as well as his folder back in the clinical area, I do not see any paperwork for patient. Will check with Patrice.

## 2018-11-14 NOTE — Progress Notes (Signed)
Pulmonary Critical Care Medicine [place]   PULMONARY CRITICAL CARE SERVICE  PROGRESS NOTE  Date of Service: 11/14/2018  Catherine Williams  WVP:[id number]  DOB: May 08, 1961   DOA: 11/09/2018  Referring Physician: Merton Border, MD  HPI: Catherine Williams is a 57 y.o. female seen for follow up of Acute on Chronic Respiratory Failure.  Patient currently is on full support has been on T collar requiring about 28% FiO2.  Medications: Reviewed on Rounds  Physical Exam:  Vitals: Temperature 98.7 pulse 75 respiratory 12 blood pressure 120/58 saturations 100%  Ventilator Settings off the ventilator on T collar trials  . General: Comfortable at this time . Eyes: Grossly normal lids, irises & conjunctiva . ENT: grossly tongue is normal . Neck: no obvious mass . Cardiovascular: S1 S2 normal no gallop . Respiratory: No rhonchi or rales . Abdomen: soft . Skin: no rash seen on limited exam . Musculoskeletal: not rigid . Psychiatric:unable to assess . Neurologic: no seizure no involuntary movements         Lab Data:   Basic Metabolic Panel: Recent Labs  Lab 11/09/18 0409 11/09/18 2217 11/10/18 0726 11/12/18 0958 11/14/18 0616  NA 135 136  136 137 136 134*  K 4.6 4.6  4.6 4.4 4.1 3.9  CL 99 101  101 102 99 98  CO2 25 21*  21* 20* 23 24  GLUCOSE 131* 127*  127* 107* 126* 138*  BUN 55* 69*  69* 80* 84* 59*  CREATININE 5.64* 6.13*  6.20* 6.48* 6.31* 5.27*  CALCIUM 9.6 9.4  9.4 9.5 9.1 9.3  MG 2.2  --   --   --   --   PHOS 4.0 5.0*  --  5.6* 5.9*    ABG: Recent Labs  Lab 11/08/18 2022  PHART 7.403  PCO2ART 43.1  PO2ART 107.0  HCO3 26.8  O2SAT 98.0    Liver Function Tests: Recent Labs  Lab 11/08/18 1121 11/09/18 2217 11/12/18 0958 11/14/18 0616  AST 115*  --   --   --   ALT 101*  --   --   --   ALKPHOS 354*  --   --   --   BILITOT 0.9  --   --   --   PROT 7.4  --   --   --   ALBUMIN 1.6* 1.5* 1.6* 1.8*   Recent Labs  Lab  11/08/18 1121  LIPASE 23  AMYLASE 44   No results for input(s): AMMONIA in the last 168 hours.  CBC: Recent Labs  Lab 11/08/18 1121 11/09/18 0409 11/09/18 2217 11/10/18 0726 11/12/18 0958 11/14/18 0616  WBC 15.0* 18.3* 13.9* 12.8* 14.2* 16.5*  NEUTROABS 11.3*  --   --   --   --   --   HGB 7.4* 6.8* 7.7* 7.7* 8.2* 8.2*  HCT 25.9* 24.4* 26.0* 27.0* 28.6* 29.5*  MCV 88.7 90.0 88.7 90.3 89.9 92.5  PLT 259 311 274 291 244 245    Cardiac Enzymes: No results for input(s): CKTOTAL, CKMB, CKMBINDEX, TROPONINI in the last 168 hours.  BNP (last 3 results) Recent Labs    05/13/18 0333  BNP 78.9    ProBNP (last 3 results) No results for input(s): PROBNP in the last 8760 hours.  Radiological Exams: No results found.  Assessment/Plan Principal Problem:   Acute on chronic respiratory failure with hypoxia (HCC) Active Problems:   Acute on chronic diastolic CHF (congestive heart failure) (HCC)   CKD (chronic kidney disease) stage  5, GFR less than 15 ml/min (HCC)   Cardiac arrest ([place])   End stage renal disease on dialysis ([place])   Healthcare-associated pneumonia   1. Acute on chronic respiratory failure with hypoxia patient is doing fine with the wean the goal is for 12 hours on T collar 2. Acute on chronic diastolic heart failure compensated we will continue to monitor 3. End-stage renal disease chronic kidney disease stage III continue with supportive care patient is being followed by nephrology for dialysis 4. Cardiac arrest rhythm is stable at this time 5. Healthcare associated pneumonia treated we will monitor   I have personally seen and evaluated the patient, evaluated laboratory and imaging results, formulated the assessment and plan and placed orders. The Patient requires high complexity decision making for assessment and support.  Case was discussed on Rounds with the Respiratory Therapy Staff  Allyne Gee, MD [facility] Pulmonary Critical Care Medicine Sleep  Medicine

## 2018-11-14 NOTE — Telephone Encounter (Signed)
I have checked boxes up front for patients daughters paperwork and do not see any. Catherine Williams have you picked up the paperwork for the daughter.

## 2018-11-14 NOTE — Progress Notes (Signed)
[facility]  ROUNDING NOTE   Subjective:  Patient seen and evaluated during hemodialysis. Tolerating well.   Objective:  Vital signs in last 24 hours:  Temperature 98.7 pulse 75 respirations 12 blood pressure 120/68  Physical Exam: General: Critically ill appearing  Head: Normocephalic, atraumatic. Moist oral mucosal membranes  Eyes: Anicteric  Neck: Tracheostomy in place  Lungs:  Scattered rhonchi, normal effort  Heart: S1S2 no rubs  Abdomen:  Soft, nontender, bowel sounds present  Extremities: Trace peripheral edema.  Neurologic: Awake, alert, following commands  Skin: No lesions  Access: LUE AVF, L IJ PC    Basic Metabolic Panel: Recent Labs  Lab 11/09/18 0409 11/09/18 2217 11/10/18 0726 11/12/18 0958 11/14/18 0616  NA 135 136  136 137 136 134*  K 4.6 4.6  4.6 4.4 4.1 3.9  CL 99 101  101 102 99 98  CO2 25 21*  21* 20* 23 24  GLUCOSE 131* 127*  127* 107* 126* 138*  BUN 55* 69*  69* 80* 84* 59*  CREATININE 5.64* 6.13*  6.20* 6.48* 6.31* 5.27*  CALCIUM 9.6 9.4  9.4 9.5 9.1 9.3  MG 2.2  --   --   --   --   PHOS 4.0 5.0*  --  5.6* 5.9*    Liver Function Tests: Recent Labs  Lab 11/08/18 1121 11/09/18 2217 11/12/18 0958 11/14/18 0616  AST 115*  --   --   --   ALT 101*  --   --   --   ALKPHOS 354*  --   --   --   BILITOT 0.9  --   --   --   PROT 7.4  --   --   --   ALBUMIN 1.6* 1.5* 1.6* 1.8*   Recent Labs  Lab 11/08/18 1121  LIPASE 23  AMYLASE 44   No results for input(s): AMMONIA in the last 168 hours.  CBC: Recent Labs  Lab 11/08/18 1121 11/09/18 0409 11/09/18 2217 11/10/18 0726 11/12/18 0958 11/14/18 0616  WBC 15.0* 18.3* 13.9* 12.8* 14.2* 16.5*  NEUTROABS 11.3*  --   --   --   --   --   HGB 7.4* 6.8* 7.7* 7.7* 8.2* 8.2*  HCT 25.9* 24.4* 26.0* 27.0* 28.6* 29.5*  MCV 88.7 90.0 88.7 90.3 89.9 92.5  PLT 259 311 274 291 244 245    Cardiac Enzymes: No results for input(s): CKTOTAL, CKMB, CKMBINDEX, TROPONINI in the  last 168 hours.  BNP: Invalid input(s): POCBNP  CBG: Recent Labs  Lab 11/08/18 2354 11/09/18 0413 11/09/18 0725 11/09/18 1127 11/09/18 1513  GLUCAP 73 125* 137* 165* 172*    Microbiology: Results for orders placed or performed during the hospital encounter of 11/08/18  Aerobic/Anaerobic Culture (surgical/deep wound)     Status: None (Preliminary result)   Collection Time: 11/09/18  5:02 PM  Result Value Ref Range Status   Specimen Description GALL BLADDER  Final   Special Requests Normal  Final   Gram Stain NO WBC SEEN NO ORGANISMS SEEN   Final   Culture   Final    NO GROWTH 4 DAYS NO ANAEROBES ISOLATED; CULTURE IN PROGRESS FOR 5 DAYS Performed at [facility], [place] [address], [place], [place] [address]    Report Status PENDING  Incomplete    Coagulation Studies: No results for input(s): LABPROT, INR in the last 72 hours.  Urinalysis: No results for input(s): COLORURINE, LABSPEC, PHURINE, GLUCOSEU, HGBUR, BILIRUBINUR, KETONESUR, PROTEINUR, UROBILINOGEN, NITRITE, LEUKOCYTESUR in  the last 72 hours.  Invalid input(s): APPERANCEUR    Imaging: No results found.   Medications:       Assessment/ Plan:  57 y.o. female with a PMHx of recurrent V. fib/PEA arrest, recent acute respiratory failure, COPD, obstructive sleep apnea, diastolic heart failure, multifactorial anemia, diabetes mellitus type 2, who was admitted to [facility] on 11/01/2018 for ongoing care.    1.  Acute renal failure/chronic kidney disease stage IV.  Patient dialysis dependent since late October.   -Patient remains dialysis dependent at the moment.  Seen and evaluated during hemodialysis and tolerating well.  Complete treatment today.  2.  Acute respiratory failure.  Patient remains on vent support.  3.  Anemia chronic kidney disease.     Hemoglobin currently 8.2.  Continue to monitor.  4.  Secondary hyperparathyroidism.  Phosphorus a bit high today at 5.9.  This should come down with  dialysis.  Reevaluate on Friday..   LOS: 0 Daevon Holdren 11/27/20198:21 AM

## 2018-11-15 LAB — AEROBIC/ANAEROBIC CULTURE W GRAM STAIN (SURGICAL/DEEP WOUND): Culture: NO GROWTH

## 2018-11-15 LAB — AEROBIC/ANAEROBIC CULTURE (SURGICAL/DEEP WOUND)
GRAM STAIN: NONE SEEN
SPECIAL REQUESTS: NORMAL

## 2018-11-15 NOTE — Progress Notes (Signed)
Pulmonary Critical Care Medicine [place]   PULMONARY CRITICAL CARE SERVICE  PROGRESS NOTE  Date of Service: 11/15/2018  Catherine Williams  ZSW:[id number]  DOB: Dec 19, 1961   DOA: 11/09/2018  Referring Physician: Merton Border, MD  HPI: Catherine Williams is a 57 y.o. female seen for follow up of Acute on Chronic Respiratory Failure.  Patient is on full vent support.  Has been basically refusing weaning on the T collar  Medications: Reviewed on Rounds  Physical Exam:  Vitals: Temperature 97.4 pulse 74 respiratory rate 28 blood pressure 105/57 saturations 99%  Ventilator Settings mode ventilation assist control FiO2 28% tidal volume 450 PEEP 5  . General: Comfortable at this time . Eyes: Grossly normal lids, irises & conjunctiva . ENT: grossly tongue is normal . Neck: no obvious mass . Cardiovascular: S1 S2 normal no gallop . Respiratory: Coarse rhonchi bilateral . Abdomen: soft . Skin: no rash seen on limited exam . Musculoskeletal: not rigid . Psychiatric:unable to assess . Neurologic: no seizure no involuntary movements         Lab Data:   Basic Metabolic Panel: Recent Labs  Lab 11/09/18 0409 11/09/18 2217 11/10/18 0726 11/12/18 0958 11/14/18 0616  NA 135 136  136 137 136 134*  K 4.6 4.6  4.6 4.4 4.1 3.9  CL 99 101  101 102 99 98  CO2 25 21*  21* 20* 23 24  GLUCOSE 131* 127*  127* 107* 126* 138*  BUN 55* 69*  69* 80* 84* 59*  CREATININE 5.64* 6.13*  6.20* 6.48* 6.31* 5.27*  CALCIUM 9.6 9.4  9.4 9.5 9.1 9.3  MG 2.2  --   --   --   --   PHOS 4.0 5.0*  --  5.6* 5.9*    ABG: Recent Labs  Lab 11/08/18 2022  PHART 7.403  PCO2ART 43.1  PO2ART 107.0  HCO3 26.8  O2SAT 98.0    Liver Function Tests: Recent Labs  Lab 11/09/18 2217 11/12/18 0958 11/14/18 0616  ALBUMIN 1.5* 1.6* 1.8*   No results for input(s): LIPASE, AMYLASE in the last 168 hours. No results for input(s): AMMONIA in the last 168 hours.  CBC: Recent Labs  Lab  11/09/18 0409 11/09/18 2217 11/10/18 0726 11/12/18 0958 11/14/18 0616  WBC 18.3* 13.9* 12.8* 14.2* 16.5*  HGB 6.8* 7.7* 7.7* 8.2* 8.2*  HCT 24.4* 26.0* 27.0* 28.6* 29.5*  MCV 90.0 88.7 90.3 89.9 92.5  PLT 311 274 291 244 245    Cardiac Enzymes: No results for input(s): CKTOTAL, CKMB, CKMBINDEX, TROPONINI in the last 168 hours.  BNP (last 3 results) Recent Labs    05/13/18 0333  BNP 78.9    ProBNP (last 3 results) No results for input(s): PROBNP in the last 8760 hours.  Radiological Exams: No results found.  Assessment/Plan Principal Problem:   Acute on chronic respiratory failure with hypoxia (HCC) Active Problems:   Acute on chronic diastolic CHF (congestive heart failure) (HCC)   CKD (chronic kidney disease) stage 5, GFR less than 15 ml/min (HCC)   Cardiac arrest (HCC)   End stage renal disease on dialysis ([place])   Healthcare-associated pneumonia   1. Acute on chronic respiratory failure with hypoxia patient's been refusing weaning on T collar continue to try to encourage compliance 2. Acute on chronic diastolic heart failure we will continue to monitor fluid status 3. Chronic kidney disease end-stage on dialysis we will follow 4. Cardiac arrest rhythm is stable at this time will monitor 5. End-stage renal  disease on dialysis followed by nephrology 6. Healthcare associated pneumonia treated we will monitor   I have personally seen and evaluated the patient, evaluated laboratory and imaging results, formulated the assessment and plan and placed orders. The Patient requires high complexity decision making for assessment and support.  Case was discussed on Rounds with the Respiratory Therapy Staff  Allyne Gee, MD [facility] Pulmonary Critical Care Medicine Sleep Medicine

## 2018-11-16 ENCOUNTER — Other Ambulatory Visit (HOSPITAL_COMMUNITY): Payer: Self-pay

## 2018-11-16 ENCOUNTER — Encounter (HOSPITAL_COMMUNITY): Payer: Medicare Other

## 2018-11-16 DIAGNOSIS — Z452 Encounter for adjustment and management of vascular access device: Secondary | ICD-10-CM | POA: Diagnosis not present

## 2018-11-16 DIAGNOSIS — K8020 Calculus of gallbladder without cholecystitis without obstruction: Secondary | ICD-10-CM | POA: Diagnosis not present

## 2018-11-16 DIAGNOSIS — J90 Pleural effusion, not elsewhere classified: Secondary | ICD-10-CM | POA: Diagnosis not present

## 2018-11-16 LAB — CBC
HCT: 27.3 % — ABNORMAL LOW (ref 36.0–46.0)
Hemoglobin: 7.8 g/dL — ABNORMAL LOW (ref 12.0–15.0)
MCH: 26.2 pg (ref 26.0–34.0)
MCHC: 28.6 g/dL — ABNORMAL LOW (ref 30.0–36.0)
MCV: 91.6 fL (ref 80.0–100.0)
PLATELETS: 328 10*3/uL (ref 150–400)
RBC: 2.98 MIL/uL — ABNORMAL LOW (ref 3.87–5.11)
RDW: 16.5 % — AB (ref 11.5–15.5)
WBC: 17.9 10*3/uL — ABNORMAL HIGH (ref 4.0–10.5)
nRBC: 0.1 % (ref 0.0–0.2)

## 2018-11-16 LAB — RENAL FUNCTION PANEL
Albumin: 2 g/dL — ABNORMAL LOW (ref 3.5–5.0)
Anion gap: 9 (ref 5–15)
BUN: 32 mg/dL — AB (ref 6–20)
CHLORIDE: 101 mmol/L (ref 98–111)
CO2: 25 mmol/L (ref 22–32)
CREATININE: 3.54 mg/dL — AB (ref 0.44–1.00)
Calcium: 9.1 mg/dL (ref 8.9–10.3)
GFR calc Af Amer: 15 mL/min — ABNORMAL LOW (ref >60)
GFR, EST NON AFRICAN AMERICAN: 13 mL/min — AB (ref >60)
Glucose, Bld: 142 mg/dL — ABNORMAL HIGH (ref 70–99)
Phosphorus: 3.6 mg/dL (ref 2.5–4.6)
Potassium: 3.4 mmol/L — ABNORMAL LOW (ref 3.5–5.1)
Sodium: 135 mmol/L (ref 135–145)

## 2018-11-16 NOTE — Progress Notes (Signed)
[facility]  ROUNDING NOTE   Subjective:  Patient seen at bedside. She completed hemodialysis. Tolerated well. Ultrafiltration achieved was 1.6 kg.   Objective:  Vital signs in last 24 hours:  Temperature 98.4 pulse 72 respirations 20 blood pressure 123/72  Physical Exam: General: Critically ill appearing  Head: Normocephalic, atraumatic. Moist oral mucosal membranes  Eyes: Anicteric  Neck: Tracheostomy in place  Lungs:  Scattered rhonchi, normal effort  Heart: S1S2 no rubs  Abdomen:  Soft, nontender, bowel sounds present  Extremities: Trace peripheral edema.  Neurologic: Awake, alert, following commands  Skin: No lesions  Access: LUE AVF, L IJ PC    Basic Metabolic Panel: Recent Labs  Lab 11/09/18 2217 11/10/18 0726 11/12/18 0958 11/14/18 0616 11/16/18 0742  NA 136  136 137 136 134* 135  K 4.6  4.6 4.4 4.1 3.9 3.4*  CL 101  101 102 99 98 101  CO2 21*  21* 20* 23 24 25   GLUCOSE 127*  127* 107* 126* 138* 142*  BUN 69*  69* 80* 84* 59* 32*  CREATININE 6.13*  6.20* 6.48* 6.31* 5.27* 3.54*  CALCIUM 9.4  9.4 9.5 9.1 9.3 9.1  PHOS 5.0*  --  5.6* 5.9* 3.6    Liver Function Tests: Recent Labs  Lab 11/09/18 2217 11/12/18 0958 11/14/18 0616 11/16/18 0742  ALBUMIN 1.5* 1.6* 1.8* 2.0*   No results for input(s): LIPASE, AMYLASE in the last 168 hours. No results for input(s): AMMONIA in the last 168 hours.  CBC: Recent Labs  Lab 11/09/18 2217 11/10/18 0726 11/12/18 0958 11/14/18 0616 11/16/18 0742  WBC 13.9* 12.8* 14.2* 16.5* 17.9*  HGB 7.7* 7.7* 8.2* 8.2* 7.8*  HCT 26.0* 27.0* 28.6* 29.5* 27.3*  MCV 88.7 90.3 89.9 92.5 91.6  PLT 274 291 244 245 328    Cardiac Enzymes: No results for input(s): CKTOTAL, CKMB, CKMBINDEX, TROPONINI in the last 168 hours.  BNP: Invalid input(s): POCBNP  CBG: Recent Labs  Lab 11/09/18 1513  GLUCAP 172*    Microbiology: Results for orders placed or performed during the hospital encounter of  11/08/18  Aerobic/Anaerobic Culture (surgical/deep wound)     Status: None   Collection Time: 11/09/18  5:02 PM  Result Value Ref Range Status   Specimen Description GALL BLADDER  Final   Special Requests Normal  Final   Gram Stain NO WBC SEEN NO ORGANISMS SEEN   Final   Culture   Final    No growth aerobically or anaerobically. Performed at [facility], [place] [address] [place], [place] [address]    Report Status 11/15/2018 FINAL  Final    Coagulation Studies: No results for input(s): LABPROT, INR in the last 72 hours.  Urinalysis: No results for input(s): COLORURINE, LABSPEC, PHURINE, GLUCOSEU, HGBUR, BILIRUBINUR, KETONESUR, PROTEINUR, UROBILINOGEN, NITRITE, LEUKOCYTESUR in the last 72 hours.  Invalid input(s): APPERANCEUR    Imaging: No results found.   Medications:       Assessment/ Plan:  57 y.o. female with a PMHx of recurrent V. fib/PEA arrest, recent acute respiratory failure, COPD, obstructive sleep apnea, diastolic heart failure, multifactorial anemia, diabetes mellitus type 2, who was admitted to [facility] on 11/01/2018 for ongoing care.    1.  Acute renal failure/chronic kidney disease stage IV.  Patient dialysis dependent since late October.   -Patient seen at bedside.  Completed dialysis today.  Ultrafiltration achieved was 1.6 kg.  2.  Acute respiratory failure.  Appears to be breathing comfortably at the moment.  Continue local  tracheostomy care.  3.  Anemia chronic kidney disease.     Hemoglobin did drop a bit to 7.8.  Continue to monitor.  Consider blood transfusion for hemoglobin of 7 or less.  4.  Secondary hyperparathyroidism.  Phosphorus now down to 3.6 and within target.  Repeat serum phosphorus on Monday.   LOS: 0 Karson Reede 11/29/201911:37 AM

## 2018-11-16 NOTE — Progress Notes (Signed)
Pulmonary Critical Care Medicine [place]   PULMONARY CRITICAL CARE SERVICE  PROGRESS NOTE  Date of Service: 11/16/2018  Catherine Williams  KWI:[id number]  DOB: 09-10-1961   DOA: 11/09/2018  Referring Physician: Merton Border, MD  HPI: Catherine Williams is a 57 y.o. female seen for follow up of Acute on Chronic Respiratory Failure.  Patient is on assist control mode patient on 30% FiO2 with a PEEP of 5  Medications: Reviewed on Rounds  Physical Exam:  Vitals: Temperature 98.4 pulse 72 respiratory 26 blood pressure 125/72 saturation 99%  Ventilator Settings mode ventilation assist control FiO2 28% tidal volume 380 PEEP 5  . General: Comfortable at this time . Eyes: Grossly normal lids, irises & conjunctiva . ENT: grossly tongue is normal . Neck: no obvious mass . Cardiovascular: S1 S2 normal no gallop . Respiratory: No rhonchi no rales are noted at this time . Abdomen: soft . Skin: no rash seen on limited exam . Musculoskeletal: not rigid . Psychiatric:unable to assess . Neurologic: no seizure no involuntary movements         Lab Data:   Basic Metabolic Panel: Recent Labs  Lab 11/09/18 2217 11/10/18 0726 11/12/18 0958 11/14/18 0616 11/16/18 0742  NA 136  136 137 136 134* 135  K 4.6  4.6 4.4 4.1 3.9 3.4*  CL 101  101 102 99 98 101  CO2 21*  21* 20* 23 24 25   GLUCOSE 127*  127* 107* 126* 138* 142*  BUN 69*  69* 80* 84* 59* 32*  CREATININE 6.13*  6.20* 6.48* 6.31* 5.27* 3.54*  CALCIUM 9.4  9.4 9.5 9.1 9.3 9.1  PHOS 5.0*  --  5.6* 5.9* 3.6    ABG: No results for input(s): PHART, PCO2ART, PO2ART, HCO3, O2SAT in the last 168 hours.  Liver Function Tests: Recent Labs  Lab 11/09/18 2217 11/12/18 0958 11/14/18 0616 11/16/18 0742  ALBUMIN 1.5* 1.6* 1.8* 2.0*   No results for input(s): LIPASE, AMYLASE in the last 168 hours. No results for input(s): AMMONIA in the last 168 hours.  CBC: Recent Labs  Lab 11/09/18 2217 11/10/18 0726  11/12/18 0958 11/14/18 0616 11/16/18 0742  WBC 13.9* 12.8* 14.2* 16.5* 17.9*  HGB 7.7* 7.7* 8.2* 8.2* 7.8*  HCT 26.0* 27.0* 28.6* 29.5* 27.3*  MCV 88.7 90.3 89.9 92.5 91.6  PLT 274 291 244 245 328    Cardiac Enzymes: No results for input(s): CKTOTAL, CKMB, CKMBINDEX, TROPONINI in the last 168 hours.  BNP (last 3 results) Recent Labs    05/13/18 0333  BNP 78.9    ProBNP (last 3 results) No results for input(s): PROBNP in the last 8760 hours.  Radiological Exams: Dg Chest Port 1 View  Result Date: 11/16/2018 CLINICAL DATA:  57 year old female with a history breathing difficulty EXAM: PORTABLE CHEST 1 VIEW COMPARISON:  11/11/2018 FINDINGS: Cardiomediastinal silhouette unchanged in size and contour. Unchanged tracheostomy. Unchanged enteric feeding tube terminating out of the field of view. Unchanged left IJ tunneled hemodialysis catheter terminating at the superior cavoatrial junction. Unchanged right IJ central catheter terminating at the upper right atrium. Lung volumes remain low. No pneumothorax. No large pleural effusion. Similar appearance of patchy opacities at the lung bases with no new confluent airspace disease. No interlobular septal thickening. IMPRESSION: Low lung volumes persist with likely atelectasis and no evidence of edema or new evidence of pneumonia. Unchanged right IJ central catheter, left IJ hemodialysis catheter, enteric feeding tube, and tracheostomy. Electronically Signed   By: Corrie Mckusick  D.O.   On: 11/16/2018 11:39    Assessment/Plan Principal Problem:   Acute on chronic respiratory failure with hypoxia (HCC) Active Problems:   Acute on chronic diastolic CHF (congestive heart failure) (HCC)   CKD (chronic kidney disease) stage 5, GFR less than 15 ml/min (HCC)   Cardiac arrest (HCC)   End stage renal disease on dialysis ([place])   Healthcare-associated pneumonia   1. Acute on chronic respiratory failure with hypoxia she is been refusing to go on wean.   Chest x-ray was done shows some low volume and also some fluid perhaps.  I have spoken with the treatment team will get a CT scan done for the effusion for further evaluation 2. Acute on chronic diastolic heart failure patient is fluid overloaded patient being dialyzed 3. Chronic kidney disease stage III on dialysis 4. Cardiac arrest rhythm stable 5. End-stage renal disease on dialysis followed by nephrology. 6. Healthcare associated pneumonia treated we will continue to monitor   I have personally seen and evaluated the patient, evaluated laboratory and imaging results, formulated the assessment and plan and placed orders. The Patient requires high complexity decision making for assessment and support.  Case was discussed on Rounds with the Respiratory Therapy Staff  Allyne Gee, MD [facility] Pulmonary Critical Care Medicine Sleep Medicine

## 2018-11-17 NOTE — Progress Notes (Signed)
Pulmonary Critical Care Medicine [place]   PULMONARY CRITICAL CARE SERVICE  PROGRESS NOTE  Date of Service: 11/17/2018  Catherine Williams  IRJ:[id number]  DOB: 1961-10-11   DOA: 11/09/2018  Referring Physician: Merton Border, MD  HPI: Catherine Williams is a 57 y.o. female seen for follow up of Acute on Chronic Respiratory Failure.  This time patient is on full support on assist control mode seems to be comfortable without distress right now.  Medications: Reviewed on Rounds  Physical Exam:  Vitals: Temperature 98.4 pulse 72 respiratory rate 20 blood pressure 119/56 saturations 100%  Ventilator Settings mode of ventilation assist control FiO2 28% tidal volume is 423 PEEP 5  . General: Comfortable at this time . Eyes: Grossly normal lids, irises & conjunctiva . ENT: grossly tongue is normal . Neck: no obvious mass . Cardiovascular: S1 S2 normal no gallop . Respiratory: Coarse breath sounds no rhonchi are noted . Abdomen: soft . Skin: no rash seen on limited exam . Musculoskeletal: not rigid . Psychiatric:unable to assess . Neurologic: no seizure no involuntary movements         Lab Data:   Basic Metabolic Panel: Recent Labs  Lab 11/12/18 0958 11/14/18 0616 11/16/18 0742  NA 136 134* 135  K 4.1 3.9 3.4*  CL 99 98 101  CO2 23 24 25   GLUCOSE 126* 138* 142*  BUN 84* 59* 32*  CREATININE 6.31* 5.27* 3.54*  CALCIUM 9.1 9.3 9.1  PHOS 5.6* 5.9* 3.6    ABG: No results for input(s): PHART, PCO2ART, PO2ART, HCO3, O2SAT in the last 168 hours.  Liver Function Tests: Recent Labs  Lab 11/12/18 0958 11/14/18 0616 11/16/18 0742  ALBUMIN 1.6* 1.8* 2.0*   No results for input(s): LIPASE, AMYLASE in the last 168 hours. No results for input(s): AMMONIA in the last 168 hours.  CBC: Recent Labs  Lab 11/12/18 0958 11/14/18 0616 11/16/18 0742  WBC 14.2* 16.5* 17.9*  HGB 8.2* 8.2* 7.8*  HCT 28.6* 29.5* 27.3*  MCV 89.9 92.5 91.6  PLT 244 245 328     Cardiac Enzymes: No results for input(s): CKTOTAL, CKMB, CKMBINDEX, TROPONINI in the last 168 hours.  BNP (last 3 results) Recent Labs    05/13/18 0333  BNP 78.9    ProBNP (last 3 results) No results for input(s): PROBNP in the last 8760 hours.  Radiological Exams: Ct Abdomen Pelvis Wo Contrast  Result Date: 11/16/2018 CLINICAL DATA:  Left pleural effusion and sepsis. EXAM: CT CHEST, ABDOMEN AND PELVIS WITHOUT CONTRAST TECHNIQUE: Multidetector CT imaging of the chest, abdomen and pelvis was performed following the standard protocol without IV contrast. COMPARISON:  CT chest 10/25/2018. FINDINGS: CT CHEST FINDINGS Cardiovascular: Normal heart size. No pericardial effusion. Aortic atherosclerosis. 3 vessel coronary artery atherosclerotic calcifications noted. Mediastinum/Nodes: Tracheostomy tube tip terminates at the thoracic inlet wall of the carina. There is an enteric tube with tip in the body of stomach. No axillary, supraclavicular, mediastinal or hilar lymph nodes. Lungs/Pleura: Subsegmental atelectasis noted overlying the posterior and lateral right lung base. Mild patchy areas of ground-glass attenuation are identified bilaterally with an upper lobe predominance, nonspecific. No airspace consolidation or pneumothorax. Musculoskeletal: Spondylosis identified within the thoracic spine. No acute or suspicious bone lesions identified. CT ABDOMEN PELVIS FINDINGS Hepatobiliary: No focal liver abnormality identified. Percutaneous cholecystostomy tube is in place. Diffuse gallbladder wall thickening is identified. Gallstones identified within the gallbladder as before. No biliary ductal dilatation. Pancreas: Normal appearance of the pancreas. Spleen: Normal in size without  focal abnormality. Adrenals/Urinary Tract: Normal adrenal glands. Right kidney cysts are again noted. No hydronephrosis identified bilaterally. The urinary bladder appears within normal limits. Stomach/Bowel: The stomach  appears normal. No abnormal dilatation of the large or small bowel loops. No wall thickening or inflammatory changes identified. Within the lower abdominal wall there is a ventral abdominal wall hernia which contains nonobstructed loops of small bowel, similar to previous exam. Vascular/Lymphatic: Aortic atherosclerosis without aneurysm. Small retroperitoneal lymph nodes identified without adenopathy. No pelvic or inguinal adenopathy identified. Reproductive: Fibroid uterus is again noted. Ovarian structures are not confidently identified. Other: There is no free fluid or fluid collections identified. Marked laxity of the ventral abdominal wall is identified. Musculoskeletal: No acute or significant osseous findings. IMPRESSION: 1. Status post percutaneous cholecystostomy. There is diffuse gallbladder wall thickening and gallstones noted. Within the limitations of unenhanced technique no biliary ductal dilatation noted. 2. Mild subsegmental atelectasis within the posterolateral right lower lobe. No airspace consolidation. 3. Aortic Atherosclerosis (ICD10-I70.0). Multi vessel coronary artery atherosclerotic calcifications noted. 4. Lower ventral abdominal wall hernia contains nonobstructed loops of small bowel. Electronically Signed   By: Kerby Moors M.D.   On: 11/16/2018 17:46   Ct Chest Wo Contrast  Result Date: 11/16/2018 CLINICAL DATA:  Left pleural effusion and sepsis. EXAM: CT CHEST, ABDOMEN AND PELVIS WITHOUT CONTRAST TECHNIQUE: Multidetector CT imaging of the chest, abdomen and pelvis was performed following the standard protocol without IV contrast. COMPARISON:  CT chest 10/25/2018. FINDINGS: CT CHEST FINDINGS Cardiovascular: Normal heart size. No pericardial effusion. Aortic atherosclerosis. 3 vessel coronary artery atherosclerotic calcifications noted. Mediastinum/Nodes: Tracheostomy tube tip terminates at the thoracic inlet wall of the carina. There is an enteric tube with tip in the body of  stomach. No axillary, supraclavicular, mediastinal or hilar lymph nodes. Lungs/Pleura: Subsegmental atelectasis noted overlying the posterior and lateral right lung base. Mild patchy areas of ground-glass attenuation are identified bilaterally with an upper lobe predominance, nonspecific. No airspace consolidation or pneumothorax. Musculoskeletal: Spondylosis identified within the thoracic spine. No acute or suspicious bone lesions identified. CT ABDOMEN PELVIS FINDINGS Hepatobiliary: No focal liver abnormality identified. Percutaneous cholecystostomy tube is in place. Diffuse gallbladder wall thickening is identified. Gallstones identified within the gallbladder as before. No biliary ductal dilatation. Pancreas: Normal appearance of the pancreas. Spleen: Normal in size without focal abnormality. Adrenals/Urinary Tract: Normal adrenal glands. Right kidney cysts are again noted. No hydronephrosis identified bilaterally. The urinary bladder appears within normal limits. Stomach/Bowel: The stomach appears normal. No abnormal dilatation of the large or small bowel loops. No wall thickening or inflammatory changes identified. Within the lower abdominal wall there is a ventral abdominal wall hernia which contains nonobstructed loops of small bowel, similar to previous exam. Vascular/Lymphatic: Aortic atherosclerosis without aneurysm. Small retroperitoneal lymph nodes identified without adenopathy. No pelvic or inguinal adenopathy identified. Reproductive: Fibroid uterus is again noted. Ovarian structures are not confidently identified. Other: There is no free fluid or fluid collections identified. Marked laxity of the ventral abdominal wall is identified. Musculoskeletal: No acute or significant osseous findings. IMPRESSION: 1. Status post percutaneous cholecystostomy. There is diffuse gallbladder wall thickening and gallstones noted. Within the limitations of unenhanced technique no biliary ductal dilatation noted. 2.  Mild subsegmental atelectasis within the posterolateral right lower lobe. No airspace consolidation. 3. Aortic Atherosclerosis (ICD10-I70.0). Multi vessel coronary artery atherosclerotic calcifications noted. 4. Lower ventral abdominal wall hernia contains nonobstructed loops of small bowel. Electronically Signed   By: Kerby Moors M.D.   On: 11/16/2018 17:46  Dg Chest Port 1 View  Result Date: 11/16/2018 CLINICAL DATA:  57 year old female with a history breathing difficulty EXAM: PORTABLE CHEST 1 VIEW COMPARISON:  11/11/2018 FINDINGS: Cardiomediastinal silhouette unchanged in size and contour. Unchanged tracheostomy. Unchanged enteric feeding tube terminating out of the field of view. Unchanged left IJ tunneled hemodialysis catheter terminating at the superior cavoatrial junction. Unchanged right IJ central catheter terminating at the upper right atrium. Lung volumes remain low. No pneumothorax. No large pleural effusion. Similar appearance of patchy opacities at the lung bases with no new confluent airspace disease. No interlobular septal thickening. IMPRESSION: Low lung volumes persist with likely atelectasis and no evidence of edema or new evidence of pneumonia. Unchanged right IJ central catheter, left IJ hemodialysis catheter, enteric feeding tube, and tracheostomy. Electronically Signed   By: Corrie Mckusick D.O.   On: 11/16/2018 11:39    Assessment/Plan Principal Problem:   Acute on chronic respiratory failure with hypoxia (HCC) Active Problems:   Acute on chronic diastolic CHF (congestive heart failure) (HCC)   CKD (chronic kidney disease) stage 5, GFR less than 15 ml/min (HCC)   Cardiac arrest (HCC)   End stage renal disease on dialysis ([place])   Healthcare-associated pneumonia   1. Acute on chronic respiratory failure with hypoxia today patient is not weaning respiratory therapy will reassess in the morning.  We will continue with secretion management pulmonary toilet. 2. Acute on  chronic diastolic heart failure baseline we will continue with supportive care 3. Chronic kidney disease stage V followed by nephrology 4. End-stage renal disease on dialysis 5. Healthcare associated pneumonia treated patient's x-ray showing some mild atelectatic area need to continue with aggressive pulmonary toilet supportive care   I have personally seen and evaluated the patient, evaluated laboratory and imaging results, formulated the assessment and plan and placed orders. The Patient requires high complexity decision making for assessment and support.  Case was discussed on Rounds with the Respiratory Therapy Staff  Allyne Gee, MD [facility] Pulmonary Critical Care Medicine Sleep Medicine

## 2018-11-18 NOTE — Progress Notes (Signed)
Pulmonary Critical Care Medicine [place]   PULMONARY CRITICAL CARE SERVICE  PROGRESS NOTE  Date of Service: 11/18/2018  Catherine Williams  ZTI:[id number]  DOB: May 15, 1961   DOA: 11/09/2018  Referring Physician: Merton Border, MD  HPI: Catherine Williams is a 57 y.o. female seen for follow up of Acute on Chronic Respiratory Failure.  Patient is on T collar has been on 28% oxygen good saturations are noted at this time.  Medications: Reviewed on Rounds  Physical Exam:  Vitals: Temperature 98.1 pulse 74 respiratory rate 22 blood pressure 130/62 saturations are 99%  Ventilator Settings off the ventilator on T collar  . General: Comfortable at this time . Eyes: Grossly normal lids, irises & conjunctiva . ENT: grossly tongue is normal . Neck: no obvious mass . Cardiovascular: S1 S2 normal no gallop . Respiratory: No rhonchi or rales are noted . Abdomen: soft . Skin: no rash seen on limited exam . Musculoskeletal: not rigid . Psychiatric:unable to assess . Neurologic: no seizure no involuntary movements         Lab Data:   Basic Metabolic Panel: Recent Labs  Lab 11/12/18 0958 11/14/18 0616 11/16/18 0742  NA 136 134* 135  K 4.1 3.9 3.4*  CL 99 98 101  CO2 23 24 25   GLUCOSE 126* 138* 142*  BUN 84* 59* 32*  CREATININE 6.31* 5.27* 3.54*  CALCIUM 9.1 9.3 9.1  PHOS 5.6* 5.9* 3.6    ABG: No results for input(s): PHART, PCO2ART, PO2ART, HCO3, O2SAT in the last 168 hours.  Liver Function Tests: Recent Labs  Lab 11/12/18 0958 11/14/18 0616 11/16/18 0742  ALBUMIN 1.6* 1.8* 2.0*   No results for input(s): LIPASE, AMYLASE in the last 168 hours. No results for input(s): AMMONIA in the last 168 hours.  CBC: Recent Labs  Lab 11/12/18 0958 11/14/18 0616 11/16/18 0742  WBC 14.2* 16.5* 17.9*  HGB 8.2* 8.2* 7.8*  HCT 28.6* 29.5* 27.3*  MCV 89.9 92.5 91.6  PLT 244 245 328    Cardiac Enzymes: No results for input(s): CKTOTAL, CKMB, CKMBINDEX,  TROPONINI in the last 168 hours.  BNP (last 3 results) Recent Labs    05/13/18 0333  BNP 78.9    ProBNP (last 3 results) No results for input(s): PROBNP in the last 8760 hours.  Radiological Exams: Ct Abdomen Pelvis Wo Contrast  Result Date: 11/16/2018 CLINICAL DATA:  Left pleural effusion and sepsis. EXAM: CT CHEST, ABDOMEN AND PELVIS WITHOUT CONTRAST TECHNIQUE: Multidetector CT imaging of the chest, abdomen and pelvis was performed following the standard protocol without IV contrast. COMPARISON:  CT chest 10/25/2018. FINDINGS: CT CHEST FINDINGS Cardiovascular: Normal heart size. No pericardial effusion. Aortic atherosclerosis. 3 vessel coronary artery atherosclerotic calcifications noted. Mediastinum/Nodes: Tracheostomy tube tip terminates at the thoracic inlet wall of the carina. There is an enteric tube with tip in the body of stomach. No axillary, supraclavicular, mediastinal or hilar lymph nodes. Lungs/Pleura: Subsegmental atelectasis noted overlying the posterior and lateral right lung base. Mild patchy areas of ground-glass attenuation are identified bilaterally with an upper lobe predominance, nonspecific. No airspace consolidation or pneumothorax. Musculoskeletal: Spondylosis identified within the thoracic spine. No acute or suspicious bone lesions identified. CT ABDOMEN PELVIS FINDINGS Hepatobiliary: No focal liver abnormality identified. Percutaneous cholecystostomy tube is in place. Diffuse gallbladder wall thickening is identified. Gallstones identified within the gallbladder as before. No biliary ductal dilatation. Pancreas: Normal appearance of the pancreas. Spleen: Normal in size without focal abnormality. Adrenals/Urinary Tract: Normal adrenal glands. Right kidney  cysts are again noted. No hydronephrosis identified bilaterally. The urinary bladder appears within normal limits. Stomach/Bowel: The stomach appears normal. No abnormal dilatation of the large or small bowel loops. No  wall thickening or inflammatory changes identified. Within the lower abdominal wall there is a ventral abdominal wall hernia which contains nonobstructed loops of small bowel, similar to previous exam. Vascular/Lymphatic: Aortic atherosclerosis without aneurysm. Small retroperitoneal lymph nodes identified without adenopathy. No pelvic or inguinal adenopathy identified. Reproductive: Fibroid uterus is again noted. Ovarian structures are not confidently identified. Other: There is no free fluid or fluid collections identified. Marked laxity of the ventral abdominal wall is identified. Musculoskeletal: No acute or significant osseous findings. IMPRESSION: 1. Status post percutaneous cholecystostomy. There is diffuse gallbladder wall thickening and gallstones noted. Within the limitations of unenhanced technique no biliary ductal dilatation noted. 2. Mild subsegmental atelectasis within the posterolateral right lower lobe. No airspace consolidation. 3. Aortic Atherosclerosis (ICD10-I70.0). Multi vessel coronary artery atherosclerotic calcifications noted. 4. Lower ventral abdominal wall hernia contains nonobstructed loops of small bowel. Electronically Signed   By: Kerby Moors M.D.   On: 11/16/2018 17:46   Ct Chest Wo Contrast  Result Date: 11/16/2018 CLINICAL DATA:  Left pleural effusion and sepsis. EXAM: CT CHEST, ABDOMEN AND PELVIS WITHOUT CONTRAST TECHNIQUE: Multidetector CT imaging of the chest, abdomen and pelvis was performed following the standard protocol without IV contrast. COMPARISON:  CT chest 10/25/2018. FINDINGS: CT CHEST FINDINGS Cardiovascular: Normal heart size. No pericardial effusion. Aortic atherosclerosis. 3 vessel coronary artery atherosclerotic calcifications noted. Mediastinum/Nodes: Tracheostomy tube tip terminates at the thoracic inlet wall of the carina. There is an enteric tube with tip in the body of stomach. No axillary, supraclavicular, mediastinal or hilar lymph nodes.  Lungs/Pleura: Subsegmental atelectasis noted overlying the posterior and lateral right lung base. Mild patchy areas of ground-glass attenuation are identified bilaterally with an upper lobe predominance, nonspecific. No airspace consolidation or pneumothorax. Musculoskeletal: Spondylosis identified within the thoracic spine. No acute or suspicious bone lesions identified. CT ABDOMEN PELVIS FINDINGS Hepatobiliary: No focal liver abnormality identified. Percutaneous cholecystostomy tube is in place. Diffuse gallbladder wall thickening is identified. Gallstones identified within the gallbladder as before. No biliary ductal dilatation. Pancreas: Normal appearance of the pancreas. Spleen: Normal in size without focal abnormality. Adrenals/Urinary Tract: Normal adrenal glands. Right kidney cysts are again noted. No hydronephrosis identified bilaterally. The urinary bladder appears within normal limits. Stomach/Bowel: The stomach appears normal. No abnormal dilatation of the large or small bowel loops. No wall thickening or inflammatory changes identified. Within the lower abdominal wall there is a ventral abdominal wall hernia which contains nonobstructed loops of small bowel, similar to previous exam. Vascular/Lymphatic: Aortic atherosclerosis without aneurysm. Small retroperitoneal lymph nodes identified without adenopathy. No pelvic or inguinal adenopathy identified. Reproductive: Fibroid uterus is again noted. Ovarian structures are not confidently identified. Other: There is no free fluid or fluid collections identified. Marked laxity of the ventral abdominal wall is identified. Musculoskeletal: No acute or significant osseous findings. IMPRESSION: 1. Status post percutaneous cholecystostomy. There is diffuse gallbladder wall thickening and gallstones noted. Within the limitations of unenhanced technique no biliary ductal dilatation noted. 2. Mild subsegmental atelectasis within the posterolateral right lower lobe.  No airspace consolidation. 3. Aortic Atherosclerosis (ICD10-I70.0). Multi vessel coronary artery atherosclerotic calcifications noted. 4. Lower ventral abdominal wall hernia contains nonobstructed loops of small bowel. Electronically Signed   By: Kerby Moors M.D.   On: 11/16/2018 17:46    Assessment/Plan Principal Problem:   Acute on chronic  respiratory failure with hypoxia (HCC) Active Problems:   Acute on chronic diastolic CHF (congestive heart failure) (HCC)   CKD (chronic kidney disease) stage 5, GFR less than 15 ml/min (HCC)   Cardiac arrest (HCC)   End stage renal disease on dialysis ([place])   Healthcare-associated pneumonia   1. Acute on chronic respiratory failure with hypoxia we will continue with T collar weaning continue secretion management pulmonary toilet. 2. Acute on chronic diastolic heart failure follow x-rays last chest x-ray results were reviewed. 3. Chronic kidney disease stage V patient is followed by nephrology for dialysis 4. Cardiac arrest rhythm stable 5. End-stage renal disease on dialysis as above 6. Healthcare associated pneumonia treated we will continue to monitor   I have personally seen and evaluated the patient, evaluated laboratory and imaging results, formulated the assessment and plan and placed orders. The Patient requires high complexity decision making for assessment and support.  Case was discussed on Rounds with the Respiratory Therapy Staff  Allyne Gee, MD [facility] Pulmonary Critical Care Medicine Sleep Medicine

## 2018-11-19 LAB — RENAL FUNCTION PANEL
ALBUMIN: 2 g/dL — AB (ref 3.5–5.0)
Anion gap: 13 (ref 5–15)
BUN: 57 mg/dL — AB (ref 6–20)
CO2: 24 mmol/L (ref 22–32)
Calcium: 9.2 mg/dL (ref 8.9–10.3)
Chloride: 93 mmol/L — ABNORMAL LOW (ref 98–111)
Creatinine, Ser: 5.35 mg/dL — ABNORMAL HIGH (ref 0.44–1.00)
GFR calc Af Amer: 9 mL/min — ABNORMAL LOW (ref >60)
GFR, EST NON AFRICAN AMERICAN: 8 mL/min — AB (ref >60)
Glucose, Bld: 159 mg/dL — ABNORMAL HIGH (ref 70–99)
PHOSPHORUS: 5.8 mg/dL — AB (ref 2.5–4.6)
POTASSIUM: 4 mmol/L (ref 3.5–5.1)
Sodium: 130 mmol/L — ABNORMAL LOW (ref 135–145)

## 2018-11-19 LAB — CBC
HEMATOCRIT: 24.4 % — AB (ref 36.0–46.0)
HEMOGLOBIN: 7.1 g/dL — AB (ref 12.0–15.0)
MCH: 26.3 pg (ref 26.0–34.0)
MCHC: 29.1 g/dL — ABNORMAL LOW (ref 30.0–36.0)
MCV: 90.4 fL (ref 80.0–100.0)
NRBC: 0 % (ref 0.0–0.2)
Platelets: 297 10*3/uL (ref 150–400)
RBC: 2.7 MIL/uL — AB (ref 3.87–5.11)
RDW: 16.1 % — AB (ref 11.5–15.5)
WBC: 14.1 10*3/uL — AB (ref 4.0–10.5)

## 2018-11-19 NOTE — Telephone Encounter (Signed)
Patrice have you received any FMLA paperwork for this patient ?

## 2018-11-19 NOTE — Telephone Encounter (Signed)
I haven't received any FMLA paperwork for this patient - She may want to contact [employer], where all FMLA forms should originate from. The number for [employer] is [phone]. -pr

## 2018-11-19 NOTE — Progress Notes (Signed)
Pulmonary Critical Care Medicine [place]   PULMONARY CRITICAL CARE SERVICE  PROGRESS NOTE  Date of Service: 11/19/2018  Catherine Williams  FXJ:[id number]  DOB: [birth year]   DOA: 11/09/2018  Referring Physician: Merton Border, MD  HPI: Catherine Williams is a 57 y.o. female seen for follow up of Acute on Chronic Respiratory Failure.  Patient currently is on T collar has been on 28% FiO2 the goal to wean today is about 16 hours patient had been having some difficulty on dialysis so was placed back on the ventilator will resume after dialysis  Medications: Reviewed on Rounds  Physical Exam:  Vitals: Temperature 97.5 pulse 78 respiratory rate 16 blood pressure 151/75 saturations 100%  Ventilator Settings mode is off the ventilator T collar 16-hour goal is noted  . General: Comfortable at this time . Eyes: Grossly normal lids, irises & conjunctiva . ENT: grossly tongue is normal . Neck: no obvious mass . Cardiovascular: S1 S2 normal no gallop . Respiratory: No rhonchi or rales are noted . Abdomen: soft . Skin: no rash seen on limited exam . Musculoskeletal: not rigid . Psychiatric:unable to assess . Neurologic: no seizure no involuntary movements         Lab Data:   Basic Metabolic Panel: Recent Labs  Lab 11/14/18 0616 11/16/18 0742 11/19/18 0537  NA 134* 135 130*  K 3.9 3.4* 4.0  CL 98 101 93*  CO2 24 25 24   GLUCOSE 138* 142* 159*  BUN 59* 32* 57*  CREATININE 5.27* 3.54* 5.35*  CALCIUM 9.3 9.1 9.2  PHOS 5.9* 3.6 5.8*    ABG: No results for input(s): PHART, PCO2ART, PO2ART, HCO3, O2SAT in the last 168 hours.  Liver Function Tests: Recent Labs  Lab 11/14/18 0616 11/16/18 0742 11/19/18 0537  ALBUMIN 1.8* 2.0* 2.0*   No results for input(s): LIPASE, AMYLASE in the last 168 hours. No results for input(s): AMMONIA in the last 168 hours.  CBC: Recent Labs  Lab 11/14/18 0616 11/16/18 0742 11/19/18 0537  WBC 16.5* 17.9* 14.1*  HGB 8.2* 7.8*  7.1*  HCT 29.5* 27.3* 24.4*  MCV 92.5 91.6 90.4  PLT 245 328 297    Cardiac Enzymes: No results for input(s): CKTOTAL, CKMB, CKMBINDEX, TROPONINI in the last 168 hours.  BNP (last 3 results) Recent Labs    05/13/18 0333  BNP 78.9    ProBNP (last 3 results) No results for input(s): PROBNP in the last 8760 hours.  Radiological Exams: No results found.  Assessment/Plan Principal Problem:   Acute on chronic respiratory failure with hypoxia (HCC) Active Problems:   Acute on chronic diastolic CHF (congestive heart failure) (HCC)   CKD (chronic kidney disease) stage 5, GFR less than 15 ml/min (HCC)   Cardiac arrest (HCC)   End stage renal disease on dialysis ([place])   Healthcare-associated pneumonia   1. Acute on chronic respiratory failure with hypoxia patient is going to continue to advance on the wean.  She needs reassurance and encouragement. 2. Cardiac arrest rhythm is stable we will monitor. 3. Chronic kidney disease stage V end-stage renal disease on dialysis followed by nephrology. 4. Healthcare associated pneumonia treated we will continue to follow. 5. Cardiac arrest rhythm stable 6. Acute on chronic diastolic heart failure need to continue to monitor her fluid status   I have personally seen and evaluated the patient, evaluated laboratory and imaging results, formulated the assessment and plan and placed orders. The Patient requires high complexity decision making for assessment and support.  Case was discussed on Rounds with the Respiratory Therapy Staff  Allyne Gee, MD [facility] Pulmonary Critical Care Medicine Sleep Medicine

## 2018-11-19 NOTE — Telephone Encounter (Signed)
Attempted to call Myrene Galas but unable to reach her. Left message for Myrene Galas to return call.

## 2018-11-19 NOTE — Progress Notes (Signed)
[facility]  ROUNDING NOTE   Subjective:  Patient seen and evaluated during hemodialysis. Tolerating well.    Objective:  Vital signs in last 24 hours:  Temperature 97.5 pulse 70 respiration 16 blood pressure 151/45  Physical Exam: General: Critically ill appearing  Head: Normocephalic, atraumatic. Moist oral mucosal membranes  Eyes: Anicteric  Neck: Tracheostomy in place  Lungs:  Scattered rhonchi, normal effort  Heart: S1S2 no rubs  Abdomen:  Soft, nontender, bowel sounds present  Extremities: Trace peripheral edema.  Neurologic: Awake, alert, following commands  Skin: No lesions  Access: LUE AVF, L IJ PC    Basic Metabolic Panel: Recent Labs  Lab 11/12/18 0958 11/14/18 0616 11/16/18 0742 11/19/18 0537  NA 136 134* 135 130*  K 4.1 3.9 3.4* 4.0  CL 99 98 101 93*  CO2 23 24 25 24   GLUCOSE 126* 138* 142* 159*  BUN 84* 59* 32* 57*  CREATININE 6.31* 5.27* 3.54* 5.35*  CALCIUM 9.1 9.3 9.1 9.2  PHOS 5.6* 5.9* 3.6 5.8*    Liver Function Tests: Recent Labs  Lab 11/12/18 0958 11/14/18 0616 11/16/18 0742 11/19/18 0537  ALBUMIN 1.6* 1.8* 2.0* 2.0*   No results for input(s): LIPASE, AMYLASE in the last 168 hours. No results for input(s): AMMONIA in the last 168 hours.  CBC: Recent Labs  Lab 11/12/18 0958 11/14/18 0616 11/16/18 0742 11/19/18 0537  WBC 14.2* 16.5* 17.9* 14.1*  HGB 8.2* 8.2* 7.8* 7.1*  HCT 28.6* 29.5* 27.3* 24.4*  MCV 89.9 92.5 91.6 90.4  PLT 244 245 328 297    Cardiac Enzymes: No results for input(s): CKTOTAL, CKMB, CKMBINDEX, TROPONINI in the last 168 hours.  BNP: Invalid input(s): POCBNP  CBG: No results for input(s): GLUCAP in the last 168 hours.  Microbiology: Results for orders placed or performed during the hospital encounter of 11/08/18  Aerobic/Anaerobic Culture (surgical/deep wound)     Status: None   Collection Time: 11/09/18  5:02 PM  Result Value Ref Range Status   Specimen Description GALL BLADDER   Final   Special Requests Normal  Final   Gram Stain NO WBC SEEN NO ORGANISMS SEEN   Final   Culture   Final    No growth aerobically or anaerobically. Performed at [facility], [place] [address] [place], [place] [address]    Report Status 11/15/2018 FINAL  Final    Coagulation Studies: No results for input(s): LABPROT, INR in the last 72 hours.  Urinalysis: No results for input(s): COLORURINE, LABSPEC, PHURINE, GLUCOSEU, HGBUR, BILIRUBINUR, KETONESUR, PROTEINUR, UROBILINOGEN, NITRITE, LEUKOCYTESUR in the last 72 hours.  Invalid input(s): APPERANCEUR    Imaging: No results found.   Medications:       Assessment/ Plan:  57 y.o. female with a PMHx of recurrent V. fib/PEA arrest, recent acute respiratory failure, COPD, obstructive sleep apnea, diastolic heart failure, multifactorial anemia, diabetes mellitus type 2, who was admitted [facility] on 11[facility]01/2018 for ongoing care.    1.  Acute renal failure/chronic kidney disease stage IV.  Patient dialysis dependent since late October.   -Patient seen and evaluated during hemodialysis.  Remains dialysis dependent at this time.  We will plan to complete dialysis treatment today.  2.  Acute respiratory failure.  Continue current respiratory support.  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  3.  Anemia chronic kidney disease.     Hemoglobin down further to 7.1.  Consider blood transfusion but defer to primary team.  4.  Secondary hyperparathyroidism.  Phosphorus repeated today and  a bit high at 5.8.  This should come down with dialysis treatment today.   LOS: 0 Catherine Williams 12/2/20198:40 AM

## 2018-11-20 ENCOUNTER — Telehealth: Payer: Self-pay | Admitting: Pulmonary Disease

## 2018-11-20 NOTE — Telephone Encounter (Signed)
Please see 11/06/18 phone note.

## 2018-11-20 NOTE — Telephone Encounter (Signed)
I have spoken to pt's daughter, Shara Blazing (DRR). Akiva stated that her employer received and approved FMLA last Monday. Per Akiva nothing further is needed on our end. Will close encounter.

## 2018-11-20 NOTE — Progress Notes (Signed)
Pulmonary Critical Care Medicine [place]   PULMONARY CRITICAL CARE SERVICE  PROGRESS NOTE  Date of Service: 11/20/2018  Catherine Williams  [id number]  DOB: 01-17-1961   DOA: 11/09/2018  Referring Physician: Merton Border, MD  HPI: Catherine Williams is a 57 y.o. female seen for follow up of Acute on Chronic Respiratory Failure.  Patient is weaning currently she is on T collar has been on 28% oxygen.  Goal was about 16 hours  Medications: Reviewed on Rounds  Physical Exam:  Vitals: Temperature 96.7 pulse 75 respiratory rate 20 blood pressure 120/58 saturations 100%  Ventilator Settings off ventilator on T collar right now  . General: Comfortable at this time . Eyes: Grossly normal lids, irises & conjunctiva . ENT: grossly tongue is normal . Neck: no obvious mass . Cardiovascular: S1 S2 normal no gallop . Respiratory: No rhonchi or rales are noted . Abdomen: soft . Skin: no rash seen on limited exam . Musculoskeletal: not rigid . Psychiatric:unable to assess . Neurologic: no seizure no involuntary movements         Lab Data:   Basic Metabolic Panel: Recent Labs  Lab 11/14/18 0616 11/16/18 0742 11/19/18 0537  NA 134* 135 130*  K 3.9 3.4* 4.0  CL 98 101 93*  CO2 24 25 24   GLUCOSE 138* 142* 159*  BUN 59* 32* 57*  CREATININE 5.27* 3.54* 5.35*  CALCIUM 9.3 9.1 9.2  PHOS 5.9* 3.6 5.8*    ABG: No results for input(s): PHART, PCO2ART, PO2ART, HCO3, O2SAT in the last 168 hours.  Liver Function Tests: Recent Labs  Lab 11/14/18 0616 11/16/18 0742 11/19/18 0537  ALBUMIN 1.8* 2.0* 2.0*   No results for input(s): LIPASE, AMYLASE in the last 168 hours. No results for input(s): AMMONIA in the last 168 hours.  CBC: Recent Labs  Lab 11/14/18 0616 11/16/18 0742 11/19/18 0537  WBC 16.5* 17.9* 14.1*  HGB 8.2* 7.8* 7.1*  HCT 29.5* 27.3* 24.4*  MCV 92.5 91.6 90.4  PLT 245 328 297    Cardiac Enzymes: No results for input(s): CKTOTAL, CKMB,  CKMBINDEX, TROPONINI in the last 168 hours.  BNP (last 3 results) Recent Labs    05/13/18 0333  BNP 78.9    ProBNP (last 3 results) No results for input(s): PROBNP in the last 8760 hours.  Radiological Exams: No results found.  Assessment/Plan Principal Problem:   Acute on chronic respiratory failure with hypoxia (HCC) Active Problems:   Acute on chronic diastolic CHF (congestive heart failure) (HCC)   CKD (chronic kidney disease) stage 5, GFR less than 15 ml/min (HCC)   Cardiac arrest (HCC)   End stage renal disease on dialysis ([place])   Healthcare-associated pneumonia   1. Acute on chronic respiratory failure with hypoxia we will continue with T collar trials goal is 16 hours. 2. Acute on chronic diastolic heart failure compensated we will continue present management. 3. Chronic kidney disease stage V end-stage on dialysis nephrology following along for hemodialysis. 4. Cardiac arrest rhythm stable 5. Healthcare associated pneumonia treated we will continue to monitor   I have personally seen and evaluated the patient, evaluated laboratory and imaging results, formulated the assessment and plan and placed orders. The Patient requires high complexity decision making for assessment and support.  Case was discussed on Rounds with the Respiratory Therapy Staff  Allyne Gee, MD [facility] Pulmonary Critical Care Medicine Sleep Medicine

## 2018-11-20 NOTE — Telephone Encounter (Signed)
Attempted to call patient today regarding RA recommendations below. I did not receive an answer at time of call. I have left a voicemail message for pt to return call. X1

## 2018-11-20 NOTE — Telephone Encounter (Signed)
LMTCB for daughter 

## 2018-11-20 NOTE — Telephone Encounter (Signed)
This patient was [facility] to [facility] under my care but [facility] then [facility]. FMLA papers will have to be filled out by current treating physicians based on prognosis and course of care etc.-please let her daughter know

## 2018-11-21 ENCOUNTER — Ambulatory Visit (INDEPENDENT_AMBULATORY_CARE_PROVIDER_SITE_OTHER): Payer: Medicare Other | Admitting: Specialist

## 2018-11-21 LAB — RENAL FUNCTION PANEL
ANION GAP: 14 (ref 5–15)
Albumin: 2.2 g/dL — ABNORMAL LOW (ref 3.5–5.0)
BUN: 22 mg/dL — ABNORMAL HIGH (ref 6–20)
CO2: 24 mmol/L (ref 22–32)
Calcium: 9 mg/dL (ref 8.9–10.3)
Chloride: 97 mmol/L — ABNORMAL LOW (ref 98–111)
Creatinine, Ser: 2.66 mg/dL — ABNORMAL HIGH (ref 0.44–1.00)
GFR calc non Af Amer: 19 mL/min — ABNORMAL LOW (ref >60)
GFR, EST AFRICAN AMERICAN: 22 mL/min — AB (ref >60)
Glucose, Bld: 132 mg/dL — ABNORMAL HIGH (ref 70–99)
Phosphorus: 2.9 mg/dL (ref 2.5–4.6)
Potassium: 3.9 mmol/L (ref 3.5–5.1)
Sodium: 135 mmol/L (ref 135–145)

## 2018-11-21 LAB — CBC
HCT: 27.7 % — ABNORMAL LOW (ref 36.0–46.0)
Hemoglobin: 7.8 g/dL — ABNORMAL LOW (ref 12.0–15.0)
MCH: 25.8 pg — ABNORMAL LOW (ref 26.0–34.0)
MCHC: 28.2 g/dL — ABNORMAL LOW (ref 30.0–36.0)
MCV: 91.7 fL (ref 80.0–100.0)
Platelets: 287 10*3/uL (ref 150–400)
RBC: 3.02 MIL/uL — AB (ref 3.87–5.11)
RDW: 16.3 % — ABNORMAL HIGH (ref 11.5–15.5)
WBC: 10.2 10*3/uL (ref 4.0–10.5)
nRBC: 0 % (ref 0.0–0.2)

## 2018-11-21 NOTE — Progress Notes (Signed)
  [facility]  ROUNDING NOTE   Subjective:  Patient continues dialysis on a Monday, Wednesday, Friday schedule. Currently off of the ventilator.   Objective:  Vital signs in last 24 hours:  Temperature 97.3 pulse 77 respirations 20 blood pressure 95/55  Physical Exam: General: Critically ill appearing  Head: Normocephalic, atraumatic. Moist oral mucosal membranes  Eyes: Anicteric  Neck: Tracheostomy in place  Lungs:  Scattered rhonchi, normal effort  Heart: S1S2 no rubs  Abdomen:  Soft, nontender, bowel sounds present  Extremities: Trace peripheral edema.  Neurologic: Awake, alert, following commands  Skin: No lesions  Access: LUE AVF, L IJ PC    Basic Metabolic Panel: Recent Labs  Lab 11/16/18 0742 11/19/18 0537  NA 135 130*  K 3.4* 4.0  CL 101 93*  CO2 25 24  GLUCOSE 142* 159*  BUN 32* 57*  CREATININE 3.54* 5.35*  CALCIUM 9.1 9.2  PHOS 3.6 5.8*    Liver Function Tests: Recent Labs  Lab 11/16/18 0742 11/19/18 0537  ALBUMIN 2.0* 2.0*   No results for input(s): LIPASE, AMYLASE in the last 168 hours. No results for input(s): AMMONIA in the last 168 hours.  CBC: Recent Labs  Lab 11/16/18 0742 11/19/18 0537  WBC 17.9* 14.1*  HGB 7.8* 7.1*  HCT 27.3* 24.4*  MCV 91.6 90.4  PLT 328 297    Cardiac Enzymes: No results for input(s): CKTOTAL, CKMB, CKMBINDEX, TROPONINI in the last 168 hours.  BNP: Invalid input(s): POCBNP  CBG: No results for input(s): GLUCAP in the last 168 hours.  Microbiology: Results for orders placed or performed during the hospital encounter of 11/08/18  Aerobic/Anaerobic Culture (surgical/deep wound)     Status: None   Collection Time: 11/09/18  5:02 PM  Result Value Ref Range Status   Specimen Description GALL BLADDER  Final   Special Requests Normal  Final   Gram Stain NO WBC SEEN NO ORGANISMS SEEN   Final   Culture   Final    No growth aerobically or anaerobically. Performed at [facility],  [place] [address], [place], [place] [address]    Report Status 11/15/2018 FINAL  Final    Coagulation Studies: No results for input(s): LABPROT, INR in the last 72 hours.  Urinalysis: No results for input(s): COLORURINE, LABSPEC, PHURINE, GLUCOSEU, HGBUR, BILIRUBINUR, KETONESUR, PROTEINUR, UROBILINOGEN, NITRITE, LEUKOCYTESUR in the last 72 hours.  Invalid input(s): APPERANCEUR    Imaging: No results found.   Medications:       Assessment/ Plan:  57 y.o. female with a PMHx of recurrent V. fib/PEA arrest, recent acute respiratory failure, COPD, obstructive sleep apnea, diastolic heart failure, multifactorial anemia, diabetes mellitus type 2, who was admitted to [facility] on 11/01/2018 for ongoing care.    1.  Acute renal failure/chronic kidney disease stage IV.  Patient dialysis dependent since late October.   -Continue dialysis on a Monday, Wednesday, Friday schedule.  Next dialysis on Friday.  2.  Acute respiratory failure.  Patient remains off of the ventilator at this time.  Tracheostomy appears functional.  3.  Anemia chronic kidney disease.     Hemoglobin 7.1 at last check.  Continue to follow closely as she may require blood transfusion if hemoglobin drops any further.  4.  Secondary hyperparathyroidism.  Repeat serum phosphorus on Friday.   LOS: 0 Annalucia Laino 12/4/20192:05 PM

## 2018-11-21 NOTE — Progress Notes (Signed)
Pulmonary Critical Care Medicine [place]   PULMONARY CRITICAL CARE SERVICE  PROGRESS NOTE  Date of Service: 11/21/2018  Catherine Williams  [id number]  DOB: 12/11/1961   DOA: 11/09/2018  Referring Physician: Merton Border, MD  HPI: Catherine Williams is a 57 y.o. female seen for follow up of Acute on Chronic Respiratory Failure.  Patient is currently on full support on assist control mode. PEEP of 5 requiring about 28% FiO2  Medications: Reviewed on Rounds  Physical Exam:  Vitals: Temperature 97.3 pulse 97 respiratory rate 26 blood pressure 95/55 saturations 100%  Ventilator Settings patient is on assist control FiO2 28% tidal volume 450 PEEP 5  . General: Comfortable at this time . Eyes: Grossly normal lids, irises & conjunctiva . ENT: grossly tongue is normal . Neck: no obvious mass . Cardiovascular: S1 S2 normal no gallop . Respiratory: Coarse breath sounds with few rhonchi noted . Abdomen: soft . Skin: no rash seen on limited exam . Musculoskeletal: not rigid . Psychiatric:unable to assess . Neurologic: no seizure no involuntary movements         Lab Data:   Basic Metabolic Panel: Recent Labs  Lab 11/16/18 0742 11/19/18 0537  NA 135 130*  K 3.4* 4.0  CL 101 93*  CO2 25 24  GLUCOSE 142* 159*  BUN 32* 57*  CREATININE 3.54* 5.35*  CALCIUM 9.1 9.2  PHOS 3.6 5.8*    ABG: No results for input(s): PHART, PCO2ART, PO2ART, HCO3, O2SAT in the last 168 hours.  Liver Function Tests: Recent Labs  Lab 11/16/18 0742 11/19/18 0537  ALBUMIN 2.0* 2.0*   No results for input(s): LIPASE, AMYLASE in the last 168 hours. No results for input(s): AMMONIA in the last 168 hours.  CBC: Recent Labs  Lab 11/16/18 0742 11/19/18 0537  WBC 17.9* 14.1*  HGB 7.8* 7.1*  HCT 27.3* 24.4*  MCV 91.6 90.4  PLT 328 297    Cardiac Enzymes: No results for input(s): CKTOTAL, CKMB, CKMBINDEX, TROPONINI in the last 168 hours.  BNP (last 3 results) Recent  Labs    05/13/18 0333  BNP 78.9    ProBNP (last 3 results) No results for input(s): PROBNP in the last 8760 hours.  Radiological Exams: No results found.  Assessment/Plan Principal Problem:   Acute on chronic respiratory failure with hypoxia (HCC) Active Problems:   Acute on chronic diastolic CHF (congestive heart failure) (HCC)   CKD (chronic kidney disease) stage 5, GFR less than 15 ml/min (HCC)   Cardiac arrest (HCC)   End stage renal disease on dialysis ([place])   Healthcare-associated pneumonia   1. Acute on chronic respiratory failure with hypoxia patient is not weaning right now because of dialysis.  Yesterday she was able to do 16 hours off the ventilator.  After she is done with dialysis respiratory therapy will try to resume her wean. 2. Acute on chronic systolic heart failure fluid management per nephrology. 3. Chronic kidney disease end-stage renal disease patient is on hemodialysis 4. Cardiac arrest rhythm is stable 5. Healthcare associated pneumonia treated we will monitor   I have personally seen and evaluated the patient, evaluated laboratory and imaging results, formulated the assessment and plan and placed orders. The Patient requires high complexity decision making for assessment and support.  Case was discussed on Rounds with the Respiratory Therapy Staff  Allyne Gee, MD [facility] Pulmonary Critical Care Medicine Sleep Medicine

## 2018-11-22 NOTE — Progress Notes (Addendum)
Pulmonary Critical Care Medicine [place]   PULMONARY CRITICAL CARE SERVICE  PROGRESS NOTE  Date of Service: 11/22/2018  Catherine Williams  IZT:[id number]  DOB: 1961/01/01   DOA: 11/09/2018  Referring Physician: Merton Border, MD  HPI: Catherine Williams is a 57 y.o. female seen for follow up of Acute on Chronic Respiratory Failure.  Patient was doing very well on T collar this morning.  She was on 28% FiO2 and also tolerating her PMV.  She was visiting with family and then suddenly she started having inspiratory stridor patient was suctioned no secretions were noted.  She was placed back on the ventilator and then she calmed right down  Medications: Reviewed on Rounds  Physical Exam:  Vitals: Temperature 97.1 pulse 93 respiratory rate 30 blood pressure 130/60 saturations 98%  Ventilator Settings currently is on T collar FiO2 28% had to be placed back on the ventilator and assist control mode  . General: Comfortable at this time . Eyes: Grossly normal lids, irises & conjunctiva . ENT: grossly tongue is normal . Neck: no obvious mass . Cardiovascular: S1 S2 normal no gallop . Respiratory: Coarse scattered rhonchi appears to be more paradoxical cord movement . Abdomen: soft . Skin: no rash seen on limited exam . Musculoskeletal: not rigid . Psychiatric:unable to assess . Neurologic: no seizure no involuntary movements         Lab Data:   Basic Metabolic Panel: Recent Labs  Lab 11/16/18 0742 11/19/18 0537 11/21/18 1352  NA 135 130* 135  K 3.4* 4.0 3.9  CL 101 93* 97*  CO2 25 24 24   GLUCOSE 142* 159* 132*  BUN 32* 57* 22*  CREATININE 3.54* 5.35* 2.66*  CALCIUM 9.1 9.2 9.0  PHOS 3.6 5.8* 2.9    ABG: No results for input(s): PHART, PCO2ART, PO2ART, HCO3, O2SAT in the last 168 hours.  Liver Function Tests: Recent Labs  Lab 11/16/18 0742 11/19/18 0537 11/21/18 1352  ALBUMIN 2.0* 2.0* 2.2*   No results for input(s): LIPASE, AMYLASE in the last 168  hours. No results for input(s): AMMONIA in the last 168 hours.  CBC: Recent Labs  Lab 11/16/18 0742 11/19/18 0537 11/21/18 1352  WBC 17.9* 14.1* 10.2  HGB 7.8* 7.1* 7.8*  HCT 27.3* 24.4* 27.7*  MCV 91.6 90.4 91.7  PLT 328 297 287    Cardiac Enzymes: No results for input(s): CKTOTAL, CKMB, CKMBINDEX, TROPONINI in the last 168 hours.  BNP (last 3 results) Recent Labs    05/13/18 0333  BNP 78.9    ProBNP (last 3 results) No results for input(s): PROBNP in the last 8760 hours.  Radiological Exams: No results found.  Assessment/Plan Principal Problem:   Acute on chronic respiratory failure with hypoxia (HCC) Active Problems:   Acute on chronic diastolic CHF (congestive heart failure) (HCC)   CKD (chronic kidney disease) stage 5, GFR less than 15 ml/min (HCC)   Cardiac arrest (HCC)   End stage renal disease on dialysis ([place])   Healthcare-associated pneumonia   1. Acute on chronic respiratory failure with hypoxia she has basically paradoxical cord movement anxiety may be a driving factor with her having to go back on the ventilator.  For now we will let her rest reassess and then try to start her back on the T collar. 2. Chronic kidney disease stage V we will continue with supportive care 3. Cardiac arrest stable will monitor 4. End-stage renal disease on dialysis 5. Healthcare associated pneumonia treated resolved 6. Anxiety needs  to be addressed discussed with primary care team   I have personally seen and evaluated the patient, evaluated laboratory and imaging results, formulated the assessment and plan and placed orders.  Time spent 35 minutes patient had acute change in status now doing better The Patient requires high complexity decision making for assessment and support.  Case was discussed on Rounds with the Respiratory Therapy Staff  Allyne Gee, MD [facility] Pulmonary Critical Care Medicine Sleep Medicine

## 2018-11-23 LAB — RENAL FUNCTION PANEL
ALBUMIN: 1.9 g/dL — AB (ref 3.5–5.0)
Anion gap: 15 (ref 5–15)
BUN: 37 mg/dL — ABNORMAL HIGH (ref 6–20)
CO2: 24 mmol/L (ref 22–32)
Calcium: 9.3 mg/dL (ref 8.9–10.3)
Chloride: 95 mmol/L — ABNORMAL LOW (ref 98–111)
Creatinine, Ser: 4.67 mg/dL — ABNORMAL HIGH (ref 0.44–1.00)
GFR calc Af Amer: 11 mL/min — ABNORMAL LOW (ref >60)
GFR, EST NON AFRICAN AMERICAN: 10 mL/min — AB (ref >60)
Glucose, Bld: 115 mg/dL — ABNORMAL HIGH (ref 70–99)
Phosphorus: 5.6 mg/dL — ABNORMAL HIGH (ref 2.5–4.6)
Potassium: 4.8 mmol/L (ref 3.5–5.1)
Sodium: 134 mmol/L — ABNORMAL LOW (ref 135–145)

## 2018-11-23 LAB — CBC
HCT: 24.3 % — ABNORMAL LOW (ref 36.0–46.0)
Hemoglobin: 7 g/dL — ABNORMAL LOW (ref 12.0–15.0)
MCH: 25.9 pg — ABNORMAL LOW (ref 26.0–34.0)
MCHC: 28.8 g/dL — ABNORMAL LOW (ref 30.0–36.0)
MCV: 90 fL (ref 80.0–100.0)
Platelets: 284 10*3/uL (ref 150–400)
RBC: 2.7 MIL/uL — ABNORMAL LOW (ref 3.87–5.11)
RDW: 16.1 % — ABNORMAL HIGH (ref 11.5–15.5)
WBC: 9.3 10*3/uL (ref 4.0–10.5)
nRBC: 0 % (ref 0.0–0.2)

## 2018-11-23 NOTE — Progress Notes (Signed)
Pulmonary Critical Care Medicine [place]   PULMONARY CRITICAL CARE SERVICE  PROGRESS NOTE  Date of Service: 11/23/2018  Catherine Williams  [id number]  DOB: [birth year]   DOA: 11/09/2018  Referring Physician: Merton Border, MD  HPI: Catherine Williams is a 57 y.o. female seen for follow up of Acute on Chronic Respiratory Failure.  Patient is weaning on T collar the goal today is for 24 hours she actually is doing well today yesterday appears that she had a panic attack  Medications: Reviewed on Rounds  Physical Exam:  Vitals: Temperature 98.2 pulse 85 respiratory 27 blood pressure 147/48 saturations 100%  Ventilator Settings off ventilator on T collar currently on 28% FiO2  . General: Comfortable at this time . Eyes: Grossly normal lids, irises & conjunctiva . ENT: grossly tongue is normal . Neck: no obvious mass . Cardiovascular: S1 S2 normal no gallop . Respiratory: No rhonchi no rales noted . Abdomen: soft . Skin: no rash seen on limited exam . Musculoskeletal: not rigid . Psychiatric:unable to assess . Neurologic: no seizure no involuntary movements         Lab Data:   Basic Metabolic Panel: Recent Labs  Lab 11/19/18 0537 11/21/18 1352 11/23/18 0501  NA 130* 135 134*  K 4.0 3.9 4.8  CL 93* 97* 95*  CO2 24 24 24   GLUCOSE 159* 132* 115*  BUN 57* 22* 37*  CREATININE 5.35* 2.66* 4.67*  CALCIUM 9.2 9.0 9.3  PHOS 5.8* 2.9 5.6*    ABG: No results for input(s): PHART, PCO2ART, PO2ART, HCO3, O2SAT in the last 168 hours.  Liver Function Tests: Recent Labs  Lab 11/19/18 0537 11/21/18 1352 11/23/18 0501  ALBUMIN 2.0* 2.2* 1.9*   No results for input(s): LIPASE, AMYLASE in the last 168 hours. No results for input(s): AMMONIA in the last 168 hours.  CBC: Recent Labs  Lab 11/19/18 0537 11/21/18 1352 11/23/18 0501  WBC 14.1* 10.2 9.3  HGB 7.1* 7.8* 7.0*  HCT 24.4* 27.7* 24.3*  MCV 90.4 91.7 90.0  PLT 297 287 284    Cardiac  Enzymes: No results for input(s): CKTOTAL, CKMB, CKMBINDEX, TROPONINI in the last 168 hours.  BNP (last 3 results) Recent Labs    05/13/18 0333  BNP 78.9    ProBNP (last 3 results) No results for input(s): PROBNP in the last 8760 hours.  Radiological Exams: No results found.  Assessment/Plan Principal Problem:   Acute on chronic respiratory failure with hypoxia (HCC) Active Problems:   Acute on chronic diastolic CHF (congestive heart failure) (HCC)   CKD (chronic kidney disease) stage 5, GFR less than 15 ml/min (HCC)   Cardiac arrest (HCC)   End stage renal disease on dialysis ([place])   Healthcare-associated pneumonia   1. Acute on chronic respiratory failure hypoxia we will continue with T collar trials continue pulmonary toilet secretion management. 2. Acute on chronic diastolic chronic systolic heart failure doing better 3. Chronic kidney disease stage V patient is being followed by nephrology for dialysis 4. Cardiac arrest rhythm is stable 5. End-stage renal disease on hemodialysis 6. Healthcare associated pneumonia treated improving   I have personally seen and evaluated the patient, evaluated laboratory and imaging results, formulated the assessment and plan and placed orders. The Patient requires high complexity decision making for assessment and support.  Case was discussed on Rounds with the Respiratory Therapy Staff  Allyne Gee, MD [facility] Pulmonary Critical Care Medicine Sleep Medicine

## 2018-11-23 NOTE — Progress Notes (Signed)
  [facility]  ROUNDING NOTE   Subjective:  She is seen and evaluated during hemodialysis. Tolerating well.  Sitting up in a chair.   Objective:  Vital signs in last 24 hours:  98.2 pulse 85 respirations 22 blood pressure 141/48  Physical Exam: General: No acute distress  Head: Normocephalic, atraumatic. Moist oral mucosal membranes  Eyes: Anicteric  Neck: Tracheostomy in place  Lungs:  Scattered rhonchi, normal effort  Heart: S1S2 no rubs  Abdomen:  Soft, nontender, bowel sounds present  Extremities: Trace peripheral edema.  Neurologic: Awake, alert, following commands  Skin: No lesions  Access: LUE AVF, L IJ PC    Basic Metabolic Panel: Recent Labs  Lab 11/19/18 0537 11/21/18 1352 11/23/18 0501  NA 130* 135 134*  K 4.0 3.9 4.8  CL 93* 97* 95*  CO2 24 24 24   GLUCOSE 159* 132* 115*  BUN 57* 22* 37*  CREATININE 5.35* 2.66* 4.67*  CALCIUM 9.2 9.0 9.3  PHOS 5.8* 2.9 5.6*    Liver Function Tests: Recent Labs  Lab 11/19/18 0537 11/21/18 1352 11/23/18 0501  ALBUMIN 2.0* 2.2* 1.9*   No results for input(s): LIPASE, AMYLASE in the last 168 hours. No results for input(s): AMMONIA in the last 168 hours.  CBC: Recent Labs  Lab 11/19/18 0537 11/21/18 1352 11/23/18 0501  WBC 14.1* 10.2 9.3  HGB 7.1* 7.8* 7.0*  HCT 24.4* 27.7* 24.3*  MCV 90.4 91.7 90.0  PLT 297 287 284    Cardiac Enzymes: No results for input(s): CKTOTAL, CKMB, CKMBINDEX, TROPONINI in the last 168 hours.  BNP: Invalid input(s): POCBNP  CBG: No results for input(s): GLUCAP in the last 168 hours.  Microbiology: Results for orders placed or performed during the hospital encounter of 11/08/18  Aerobic/Anaerobic Culture (surgical/deep wound)     Status: None   Collection Time: 11/09/18  5:02 PM  Result Value Ref Range Status   Specimen Description GALL BLADDER  Final   Special Requests Normal  Final   Gram Stain NO WBC SEEN NO ORGANISMS SEEN   Final   Culture   Final   No growth aerobically or anaerobically. Performed at [facility], [place] [address], [place], [place] [address]    Report Status 11/15/2018 FINAL  Final    Coagulation Studies: No results for input(s): LABPROT, INR in the last 72 hours.  Urinalysis: No results for input(s): COLORURINE, LABSPEC, PHURINE, GLUCOSEU, HGBUR, BILIRUBINUR, KETONESUR, PROTEINUR, UROBILINOGEN, NITRITE, LEUKOCYTESUR in the last 72 hours.  Invalid input(s): APPERANCEUR    Imaging: No results found.   Medications:       Assessment/ Plan:  57 y.o. female with a PMHx of recurrent V. fib/PEA arrest, recent acute respiratory failure, COPD, obstructive sleep apnea, diastolic heart failure, multifactorial anemia, diabetes mellitus type 2, who was admitted to [facility] on 11/01/2018 for ongoing care.    1.  Acute renal failure/chronic kidney disease stage IV.  Patient dialysis dependent since late October.   -Patient seen and evaluated during hemodialysis.  Tolerating well.  We plan to complete dialysis treatment today.  2.  Acute respiratory failure.  Family doing well off of the ventilator.  Continue tracheostomy care.  3.  Anemia chronic kidney disease.    Restart retacrit 10000 units IV with HD on MWF.   4.  Secondary hyperparathyroidism.  Phosphorus currently 5.6.  Recheck on Monday.   LOS: 0 Katelynd Blauvelt 12/6/20192:46 PM

## 2018-11-24 ENCOUNTER — Other Ambulatory Visit (HOSPITAL_COMMUNITY): Payer: Self-pay

## 2018-11-24 DIAGNOSIS — R0602 Shortness of breath: Secondary | ICD-10-CM | POA: Diagnosis not present

## 2018-11-24 LAB — TROPONIN I
Troponin I: <0.03 ng/mL (ref <0.03)
Troponin I: <0.03 ng/mL (ref <0.03)
Troponin I: <0.03 ng/mL (ref <0.03)

## 2018-11-24 LAB — D-DIMER, QUANTITATIVE: D-Dimer, Quant: 12.03 ug/mL-FEU — ABNORMAL HIGH (ref 0.00–0.50)

## 2018-11-24 NOTE — Progress Notes (Signed)
Pulmonary Critical Care Medicine [place]   PULMONARY CRITICAL CARE SERVICE  PROGRESS NOTE  Date of Service: 11/24/2018  Catherine Williams  HQI:[id number]  DOB: [birth year]   DOA: 11/09/2018  Referring Physician: Merton Border, MD  HPI: Catherine Williams is a 57 y.o. female seen for follow up of Acute on Chronic Respiratory Failure.  Patient did well on T collar until 655 this morning when she had an episode of panic and had to be placed back on the vent.  Few hours later she was again tried on trach collar and within an hour had another episode of chest pain and was placed back on the vent.  RT is instructed to repeat trach collar trial this afternoon as patient will tolerate.  Medications: Reviewed on Rounds  Physical Exam:  Vitals: Pulse 97 respirations 20 blood pressure 128/52 O2 saturation 98% temperature 97.4  Ventilator Settings ventilator mode AC VC tidal volume 400 rate 20 FiO2 28% PEEP of 5  . General: Comfortable at this time . Eyes: Grossly normal lids, irises & conjunctiva . ENT: grossly tongue is normal . Neck: no obvious mass . Cardiovascular: S1 S2 normal no gallop . Respiratory: Coarse breath sounds bilaterally. . Abdomen: soft . Skin: no rash seen on limited exam . Musculoskeletal: not rigid . Psychiatric:unable to assess . Neurologic: no seizure no involuntary movements         Lab Data:   Basic Metabolic Panel: Recent Labs  Lab 11/19/18 0537 11/21/18 1352 11/23/18 0501  NA 130* 135 134*  K 4.0 3.9 4.8  CL 93* 97* 95*  CO2 24 24 24   GLUCOSE 159* 132* 115*  BUN 57* 22* 37*  CREATININE 5.35* 2.66* 4.67*  CALCIUM 9.2 9.0 9.3  PHOS 5.8* 2.9 5.6*    ABG: No results for input(s): PHART, PCO2ART, PO2ART, HCO3, O2SAT in the last 168 hours.  Liver Function Tests: Recent Labs  Lab 11/19/18 0537 11/21/18 1352 11/23/18 0501  ALBUMIN 2.0* 2.2* 1.9*   No results for input(s): LIPASE, AMYLASE in the last 168 hours. No results for  input(s): AMMONIA in the last 168 hours.  CBC: Recent Labs  Lab 11/19/18 0537 11/21/18 1352 11/23/18 0501  WBC 14.1* 10.2 9.3  HGB 7.1* 7.8* 7.0*  HCT 24.4* 27.7* 24.3*  MCV 90.4 91.7 90.0  PLT 297 287 284    Cardiac Enzymes: Recent Labs  Lab 11/24/18 1051  TROPONINI <0.03    BNP (last 3 results) Recent Labs    05/13/18 0333  BNP 78.9    ProBNP (last 3 results) No results for input(s): PROBNP in the last 8760 hours.  Radiological Exams: Dg Chest Port 1 View  Result Date: 11/24/2018 CLINICAL DATA:  Shortness of breath. EXAM: PORTABLE CHEST 1 VIEW COMPARISON:  11/16/2018 FINDINGS: Support lines and tubes in appropriate position. Mild cardiomegaly remains stable. Both lungs are clear. IMPRESSION: Stable mild cardiomegaly.  No active lung disease. Electronically Signed   By: Earle Gell M.D.   On: 11/24/2018 12:58    Assessment/Plan Principal Problem:   Acute on chronic respiratory failure with hypoxia (HCC) Active Problems:   Acute on chronic diastolic CHF (congestive heart failure) (HCC)   CKD (chronic kidney disease) stage 5, GFR less than 15 ml/min (HCC)   Cardiac arrest (HCC)   End stage renal disease on dialysis ([place])   Healthcare-associated pneumonia   1. Acute on chronic respiratory failure with hypoxia repeat patient's trach collar trial this afternoon.  Switch patient's trach to a #  6 Shiley XLT posterior. 2. Acute on chronic diastolic chronic systolic heart failure doing better at this time 3. Chronic kidney disease stage V patient will continue to be followed by nephrology completed dialysis yesterday. 4. Cardiac arrest rhythm is currently stable 5. End-stage renal disease on hemodialysis followed by nephrology 6. Healthcare associated pneumonia treated, x-ray this morning shows clear lungs bilaterally.   I have personally seen and evaluated the patient, evaluated laboratory and imaging results, formulated the assessment and plan and placed orders. The  Patient requires high complexity decision making for assessment and support.  Case was discussed on Rounds with the Respiratory Therapy Staff  Allyne Gee, MD [facility] Pulmonary Critical Care Medicine Sleep Medicine

## 2018-11-25 NOTE — Progress Notes (Signed)
Pulmonary Critical Care Medicine [place]   PULMONARY CRITICAL CARE SERVICE  PROGRESS NOTE  Date of Service: 11/25/2018  SRAVYA GRISSOM  PJA:[id number]  DOB: 1961-06-29   DOA: 11/09/2018  Referring Physician: Merton Border, MD  HPI: HALSEY PERSAUD is a 57 y.o. female seen for follow up of Acute on Chronic Respiratory Failure.  Patient's trach changed yesterday to 6 XLT.  She is doing well on trach collar 28% FiO2 goal today will be 24 hours.  Medications: Reviewed on Rounds  Physical Exam:  Vitals: Pulse 74 respirations 20 blood pressure 114/72 O2 saturation 100% temp 97.6  Ventilator Settings patient not currently on ventilator  . General: Comfortable at this time . Eyes: Grossly normal lids, irises & conjunctiva . ENT: grossly tongue is normal . Neck: no obvious mass . Cardiovascular: S1 S2 normal no gallop . Respiratory: No wheezes or rhonchi noted . Abdomen: soft . Skin: no rash seen on limited exam . Musculoskeletal: not rigid . Psychiatric:unable to assess . Neurologic: no seizure no involuntary movements         Lab Data:   Basic Metabolic Panel: Recent Labs  Lab 11/19/18 0537 11/21/18 1352 11/23/18 0501  NA 130* 135 134*  K 4.0 3.9 4.8  CL 93* 97* 95*  CO2 24 24 24   GLUCOSE 159* 132* 115*  BUN 57* 22* 37*  CREATININE 5.35* 2.66* 4.67*  CALCIUM 9.2 9.0 9.3  PHOS 5.8* 2.9 5.6*    ABG: No results for input(s): PHART, PCO2ART, PO2ART, HCO3, O2SAT in the last 168 hours.  Liver Function Tests: Recent Labs  Lab 11/19/18 0537 11/21/18 1352 11/23/18 0501  ALBUMIN 2.0* 2.2* 1.9*   No results for input(s): LIPASE, AMYLASE in the last 168 hours. No results for input(s): AMMONIA in the last 168 hours.  CBC: Recent Labs  Lab 11/19/18 0537 11/21/18 1352 11/23/18 0501  WBC 14.1* 10.2 9.3  HGB 7.1* 7.8* 7.0*  HCT 24.4* 27.7* 24.3*  MCV 90.4 91.7 90.0  PLT 297 287 284    Cardiac Enzymes: Recent Labs  Lab 11/24/18 1051  11/24/18 1409 11/24/18 1807  TROPONINI <0.03 <0.03 <0.03    BNP (last 3 results) Recent Labs    05/13/18 0333  BNP 78.9    ProBNP (last 3 results) No results for input(s): PROBNP in the last 8760 hours.  Radiological Exams: Dg Chest Port 1 View  Result Date: 11/24/2018 CLINICAL DATA:  Shortness of breath. EXAM: PORTABLE CHEST 1 VIEW COMPARISON:  11/16/2018 FINDINGS: Support lines and tubes in appropriate position. Mild cardiomegaly remains stable. Both lungs are clear. IMPRESSION: Stable mild cardiomegaly.  No active lung disease. Electronically Signed   By: Earle Gell M.D.   On: 11/24/2018 12:58    Assessment/Plan Principal Problem:   Acute on chronic respiratory failure with hypoxia (HCC) Active Problems:   Acute on chronic diastolic CHF (congestive heart failure) (HCC)   CKD (chronic kidney disease) stage 5, GFR less than 15 ml/min (HCC)   Cardiac arrest (HCC)   End stage renal disease on dialysis ([place])   Healthcare-associated pneumonia   1. Acute on chronic respiratory failure with hypoxia patient currently doing well on trach collar 28%.  #6 Shiley XLT posterior placed yesterday. 2. Acute on chronic diastolic chronic systolic heart failure doing better 3. Chronic kidney disease stage V patient continued to be followed by nephrology 4. Cardiac arrest, rhythm is currently stable 5. End-stage renal disease on hemodialysis followed by nephrology 6. Healthcare associated pneumonia treated  I have personally seen and evaluated the patient, evaluated laboratory and imaging results, formulated the assessment and plan and placed orders. The Patient requires high complexity decision making for assessment and support.  Case was discussed on Rounds with the Respiratory Therapy Staff  Allyne Gee, MD [facility] Pulmonary Critical Care Medicine Sleep Medicine

## 2018-11-26 LAB — RENAL FUNCTION PANEL
Albumin: 1.9 g/dL — ABNORMAL LOW (ref 3.5–5.0)
Anion gap: 14 (ref 5–15)
BUN: 38 mg/dL — ABNORMAL HIGH (ref 6–20)
CO2: 26 mmol/L (ref 22–32)
Calcium: 9.2 mg/dL (ref 8.9–10.3)
Chloride: 94 mmol/L — ABNORMAL LOW (ref 98–111)
Creatinine, Ser: 5.91 mg/dL — ABNORMAL HIGH (ref 0.44–1.00)
GFR calc Af Amer: 8 mL/min — ABNORMAL LOW (ref >60)
GFR calc non Af Amer: 7 mL/min — ABNORMAL LOW (ref >60)
Glucose, Bld: 136 mg/dL — ABNORMAL HIGH (ref 70–99)
PHOSPHORUS: 6.1 mg/dL — AB (ref 2.5–4.6)
Potassium: 3.8 mmol/L (ref 3.5–5.1)
Sodium: 134 mmol/L — ABNORMAL LOW (ref 135–145)

## 2018-11-26 LAB — PREPARE RBC (CROSSMATCH)

## 2018-11-26 NOTE — Progress Notes (Signed)
Pulmonary Critical Care Medicine [place]   PULMONARY CRITICAL CARE SERVICE  PROGRESS NOTE  Date of Service: 11/26/2018  Catherine Williams  SNK:[id number]  DOB: 1961/08/27   DOA: 11/09/2018  Referring Physician: Merton Border, MD  HPI: Catherine Williams is a 57 y.o. female seen for follow up of Acute on Chronic Respiratory Failure.  Patient is comfortable right now without distress.  On T collar she is weaning  Medications: Reviewed on Rounds  Physical Exam:  Vitals: Temperature 98.6 pulse 83 respiratory rate 16 blood pressure 106/49 saturations 99%  Ventilator Settings off the ventilator on T collar  . General: Comfortable at this time . Eyes: Grossly normal lids, irises & conjunctiva . ENT: grossly tongue is normal . Neck: no obvious mass . Cardiovascular: S1 S2 normal no gallop . Respiratory: No rhonchi or rales . Abdomen: soft . Skin: no rash seen on limited exam . Musculoskeletal: not rigid . Psychiatric:unable to assess . Neurologic: no seizure no involuntary movements         Lab Data:   Basic Metabolic Panel: Recent Labs  Lab 11/21/18 1352 11/23/18 0501 11/26/18 0638  NA 135 134* 134*  K 3.9 4.8 3.8  CL 97* 95* 94*  CO2 24 24 26   GLUCOSE 132* 115* 136*  BUN 22* 37* 38*  CREATININE 2.66* 4.67* 5.91*  CALCIUM 9.0 9.3 9.2  PHOS 2.9 5.6* 6.1*    ABG: No results for input(s): PHART, PCO2ART, PO2ART, HCO3, O2SAT in the last 168 hours.  Liver Function Tests: Recent Labs  Lab 11/21/18 1352 11/23/18 0501 11/26/18 9379  ALBUMIN 2.2* 1.9* 1.9*   No results for input(s): LIPASE, AMYLASE in the last 168 hours. No results for input(s): AMMONIA in the last 168 hours.  CBC: Recent Labs  Lab 11/21/18 1352 11/23/18 0501 11/26/18 0638  WBC 10.2 9.3 10.0  HGB 7.8* 7.0* 6.8*  HCT 27.7* 24.3* 24.4*  MCV 91.7 90.0 93.5  PLT 287 284 355    Cardiac Enzymes: Recent Labs  Lab 11/24/18 1051 11/24/18 1409 11/24/18 1807  TROPONINI <0.03  <0.03 <0.03    BNP (last 3 results) Recent Labs    05/13/18 0333  BNP 78.9    ProBNP (last 3 results) No results for input(s): PROBNP in the last 8760 hours.  Radiological Exams: No results found.  Assessment/Plan Principal Problem:   Acute on chronic respiratory failure with hypoxia (HCC) Active Problems:   Acute on chronic diastolic CHF (congestive heart failure) (HCC)   CKD (chronic kidney disease) stage 5, GFR less than 15 ml/min (HCC)   Cardiac arrest (HCC)   End stage renal disease on dialysis ([place])   Healthcare-associated pneumonia   1. Acute on chronic respiratory failure with hypoxia we will continue with weaning on T collar continue pulmonary toilet supportive care. 2. Acute on chronic diastolic heart failure at baseline we will continue with present management 3. Chronic kidney disease patient is on dialysis end-stage 4. Healthcare associated pneumonia treated 5. Cardiac arrest rhythm stable   I have personally seen and evaluated the patient, evaluated laboratory and imaging results, formulated the assessment and plan and placed orders. The Patient requires high complexity decision making for assessment and support.  Case was discussed on Rounds with the Respiratory Therapy Staff  Allyne Gee, MD [facility] Pulmonary Critical Care Medicine Sleep Medicine

## 2018-11-26 NOTE — Progress Notes (Signed)
[facility]  ROUNDING NOTE   Subjective:  Patient seen and evaluated during hemodialysis. Tolerating well. Laying in bed at the moment.   Objective:  Vital signs in last 24 hours:  98.6 pulse 83 respiration 16 blood pressure 106/49  Physical Exam: General: No acute distress  Head: Normocephalic, atraumatic. Moist oral mucosal membranes  Eyes: Anicteric  Neck: Tracheostomy in place  Lungs:  Scattered rhonchi, normal effort  Heart: S1S2 no rubs  Abdomen:  Soft, nontender, bowel sounds present  Extremities: Trace peripheral edema.  Neurologic: Awake, alert, following commands  Skin: No lesions  Access: LUE AVF, L IJ PC    Basic Metabolic Panel: Recent Labs  Lab 11/21/18 1352 11/23/18 0501 11/26/18 0638  NA 135 134* 134*  K 3.9 4.8 3.8  CL 97* 95* 94*  CO2 24 24 26   GLUCOSE 132* 115* 136*  BUN 22* 37* 38*  CREATININE 2.66* 4.67* 5.91*  CALCIUM 9.0 9.3 9.2  PHOS 2.9 5.6* 6.1*    Liver Function Tests: Recent Labs  Lab 11/21/18 1352 11/23/18 0501 11/26/18 0638  ALBUMIN 2.2* 1.9* 1.9*   No results for input(s): LIPASE, AMYLASE in the last 168 hours. No results for input(s): AMMONIA in the last 168 hours.  CBC: Recent Labs  Lab 11/21/18 1352 11/23/18 0501 11/26/18 0638  WBC 10.2 9.3 10.0  HGB 7.8* 7.0* 6.8*  HCT 27.7* 24.3* 24.4*  MCV 91.7 90.0 93.5  PLT 287 284 355    Cardiac Enzymes: Recent Labs  Lab 11/24/18 1051 11/24/18 1409 11/24/18 1807  TROPONINI <0.03 <0.03 <0.03    BNP: Invalid input(s): POCBNP  CBG: No results for input(s): GLUCAP in the last 168 hours.  Microbiology: Results for orders placed or performed during the hospital encounter of 11/08/18  Aerobic/Anaerobic Culture (surgical/deep wound)     Status: None   Collection Time: 11/09/18  5:02 PM  Result Value Ref Range Status   Specimen Description GALL BLADDER  Final   Special Requests Normal  Final   Gram Stain NO WBC SEEN NO ORGANISMS SEEN   Final   Culture   Final    No growth aerobically or anaerobically. Performed at [facility], [place] [address] [place], [place] [address]    Report Status 11/15/2018 FINAL  Final    Coagulation Studies: No results for input(s): LABPROT, INR in the last 72 hours.  Urinalysis: No results for input(s): COLORURINE, LABSPEC, PHURINE, GLUCOSEU, HGBUR, BILIRUBINUR, KETONESUR, PROTEINUR, UROBILINOGEN, NITRITE, LEUKOCYTESUR in the last 72 hours.  Invalid input(s): APPERANCEUR    Imaging: Dg Chest Port 1 View  Result Date: 11/24/2018 CLINICAL DATA:  Shortness of breath. EXAM: PORTABLE CHEST 1 VIEW COMPARISON:  11/16/2018 FINDINGS: Support lines and tubes in appropriate position. Mild cardiomegaly remains stable. Both lungs are clear. IMPRESSION: Stable mild cardiomegaly.  No active lung disease. Electronically Signed   By: Earle Gell M.D.   On: 11/24/2018 12:58     Medications:       Assessment/ Plan:  57 y.o. female with a PMHx of recurrent V. fib/PEA arrest, recent acute respiratory failure, COPD, obstructive sleep apnea, diastolic heart failure, multifactorial anemia, diabetes mellitus type 2, who was admitted to [facility] on 11/01/2018 for ongoing care.    1.  Acute renal failure/chronic kidney disease stage IV.  Patient dialysis dependent since late October.   -Patient seen and evaluated during hemodialysis.  Tolerating well.  2.  Acute respiratory failure.  Patient with tracheostomy which appears to be functioning well.  3.  Anemia chronic kidney disease.     Hemoglobin down to 6.8.  Consider blood transfusion but defer to primary team.  4.  Secondary hyperparathyroidism.  Phosphorus noted to be a bit high at 6.1.  Repeat this value on Wednesday.   LOS: 0 [place] Raben[place] 12/9/20198:37 AM

## 2018-11-27 LAB — TYPE AND SCREEN
ABO/RH(D): O POS
Antibody Screen: NEGATIVE
Unit division: 0

## 2018-11-27 LAB — CBC
HCT: 24.4 % — ABNORMAL LOW (ref 36.0–46.0)
HEMATOCRIT: 29.1 % — AB (ref 36.0–46.0)
Hemoglobin: 6.8 g/dL — CL (ref 12.0–15.0)
Hemoglobin: 8.3 g/dL — ABNORMAL LOW (ref 12.0–15.0)
MCH: 26.1 pg (ref 26.0–34.0)
MCH: 25.8 pg — AB (ref 26.0–34.0)
MCHC: 27.9 g/dL — ABNORMAL LOW (ref 30.0–36.0)
MCHC: 28.5 g/dL — ABNORMAL LOW (ref 30.0–36.0)
MCV: 93.5 fL (ref 80.0–100.0)
MCV: 90.4 fL (ref 80.0–100.0)
Platelets: 355 10*3/uL (ref 150–400)
Platelets: 287 10*3/uL (ref 150–400)
RBC: 2.61 MIL/uL — ABNORMAL LOW (ref 3.87–5.11)
RBC: 3.22 MIL/uL — ABNORMAL LOW (ref 3.87–5.11)
RDW: 16.4 % — ABNORMAL HIGH (ref 11.5–15.5)
RDW: 17.1 % — ABNORMAL HIGH (ref 11.5–15.5)
WBC: 10 10*3/uL (ref 4.0–10.5)
WBC: 10.1 10*3/uL (ref 4.0–10.5)
nRBC: 0 % (ref 0.0–0.2)
nRBC: 0 % (ref 0.0–0.2)

## 2018-11-27 LAB — RENAL FUNCTION PANEL
Albumin: 1.9 g/dL — ABNORMAL LOW (ref 3.5–5.0)
Anion gap: 14 (ref 5–15)
BUN: 26 mg/dL — ABNORMAL HIGH (ref 6–20)
CHLORIDE: 95 mmol/L — AB (ref 98–111)
CO2: 25 mmol/L (ref 22–32)
Calcium: 9.1 mg/dL (ref 8.9–10.3)
Creatinine, Ser: 4.29 mg/dL — ABNORMAL HIGH (ref 0.44–1.00)
GFR calc Af Amer: 12 mL/min — ABNORMAL LOW (ref >60)
GFR calc non Af Amer: 11 mL/min — ABNORMAL LOW (ref >60)
Glucose, Bld: 147 mg/dL — ABNORMAL HIGH (ref 70–99)
POTASSIUM: 5.4 mmol/L — AB (ref 3.5–5.1)
Phosphorus: 4.4 mg/dL (ref 2.5–4.6)
Sodium: 134 mmol/L — ABNORMAL LOW (ref 135–145)

## 2018-11-27 LAB — BPAM RBC
Blood Product Expiration Date: 202001052359
ISSUE DATE / TIME: 201912091150
Unit Type and Rh: 5100

## 2018-11-27 NOTE — Progress Notes (Signed)
Pulmonary Critical Care Medicine [place]   PULMONARY CRITICAL CARE SERVICE  PROGRESS NOTE  Date of Service: 11/27/2018  TYKESHA KONICKI  WPY:[id number]  DOB: [birth year]   DOA: 11/09/2018  Referring Physician: Merton Border, MD  HPI: Catherine Williams is a 57 y.o. female seen for follow up of Acute on Chronic Respiratory Failure.  Patient is on T collar at this time she has been off the ventilator for 48 hours looks good so far  Medications: Reviewed on Rounds  Physical Exam:  Vitals: Temperature 98.9 pulse 88 respiratory 20 blood pressure 143/68 saturations 98%  Ventilator Settings off the ventilator on T collar  . General: Comfortable at this time . Eyes: Grossly normal lids, irises & conjunctiva . ENT: grossly tongue is normal . Neck: no obvious mass . Cardiovascular: S1 S2 normal no gallop . Respiratory: Coarse breath sounds no rhonchi . Abdomen: soft . Skin: no rash seen on limited exam . Musculoskeletal: not rigid . Psychiatric:unable to assess . Neurologic: no seizure no involuntary movements         Lab Data:   Basic Metabolic Panel: Recent Labs  Lab 11/21/18 1352 11/23/18 0501 11/26/18 0638 11/27/18 0529  NA 135 134* 134* 134*  K 3.9 4.8 3.8 5.4*  CL 97* 95* 94* 95*  CO2 24 24 26 25   GLUCOSE 132* 115* 136* 147*  BUN 22* 37* 38* 26*  CREATININE 2.66* 4.67* 5.91* 4.29*  CALCIUM 9.0 9.3 9.2 9.1  PHOS 2.9 5.6* 6.1* 4.4    ABG: No results for input(s): PHART, PCO2ART, PO2ART, HCO3, O2SAT in the last 168 hours.  Liver Function Tests: Recent Labs  Lab 11/21/18 1352 11/23/18 0501 11/26/18 0638 11/27/18 0529  ALBUMIN 2.2* 1.9* 1.9* 1.9*   No results for input(s): LIPASE, AMYLASE in the last 168 hours. No results for input(s): AMMONIA in the last 168 hours.  CBC: Recent Labs  Lab 11/21/18 1352 11/23/18 0501 11/26/18 0638 11/27/18 0529  WBC 10.2 9.3 10.0 10.1  HGB 7.8* 7.0* 6.8* 8.3*  HCT 27.7* 24.3* 24.4* 29.1*  MCV 91.7  90.0 93.5 90.4  PLT 287 284 355 287    Cardiac Enzymes: Recent Labs  Lab 11/24/18 1051 11/24/18 1409 11/24/18 1807  TROPONINI <0.03 <0.03 <0.03    BNP (last 3 results) Recent Labs    05/13/18 0333  BNP 78.9    ProBNP (last 3 results) No results for input(s): PROBNP in the last 8760 hours.  Radiological Exams: No results found.  Assessment/Plan Principal Problem:   Acute on chronic respiratory failure with hypoxia (HCC) Active Problems:   Acute on chronic diastolic CHF (congestive heart failure) (HCC)   CKD (chronic kidney disease) stage 5, GFR less than 15 ml/min (HCC)   Cardiac arrest (HCC)   End stage renal disease on dialysis ([place])   Healthcare-associated pneumonia   1. Acute on chronic respiratory failure with hypoxia we will continue with T collar wean.  Hopefully we should be able to downsize in the next day continue pulmonary toilet supportive care 2. Acute on chronic diastolic heart failure compensated 3. Chronic kidney disease stage V patient is seen by nephrology for dialysis 4. Cardiac arrest rhythm stable 5. Healthcare associated pneumonia treated 6. End-stage renal disease on dialysis   I have personally seen and evaluated the patient, evaluated laboratory and imaging results, formulated the assessment and plan and placed orders. The Patient requires high complexity decision making for assessment and support.  Case was discussed on Rounds with the  Respiratory Therapy Staff  Allyne Gee, MD [facility] Pulmonary Critical Care Medicine Sleep Medicine

## 2018-11-28 LAB — RENAL FUNCTION PANEL
Albumin: 1.6 g/dL — ABNORMAL LOW (ref 3.5–5.0)
Anion gap: 14 (ref 5–15)
BUN: 30 mg/dL — ABNORMAL HIGH (ref 6–20)
CO2: 24 mmol/L (ref 22–32)
Calcium: 9.4 mg/dL (ref 8.9–10.3)
Chloride: 97 mmol/L — ABNORMAL LOW (ref 98–111)
Creatinine, Ser: 5.5 mg/dL — ABNORMAL HIGH (ref 0.44–1.00)
GFR calc Af Amer: 9 mL/min — ABNORMAL LOW
GFR calc non Af Amer: 8 mL/min — ABNORMAL LOW
Glucose, Bld: 130 mg/dL — ABNORMAL HIGH (ref 70–99)
Phosphorus: 5.4 mg/dL — ABNORMAL HIGH (ref 2.5–4.6)
Potassium: 3.7 mmol/L (ref 3.5–5.1)
Sodium: 135 mmol/L (ref 135–145)

## 2018-11-28 LAB — CBC
HCT: 27 % — ABNORMAL LOW (ref 36.0–46.0)
Hemoglobin: 7.5 g/dL — ABNORMAL LOW (ref 12.0–15.0)
MCH: 25.8 pg — ABNORMAL LOW (ref 26.0–34.0)
MCHC: 27.8 g/dL — ABNORMAL LOW (ref 30.0–36.0)
MCV: 92.8 fL (ref 80.0–100.0)
Platelets: 349 10*3/uL (ref 150–400)
RBC: 2.91 MIL/uL — ABNORMAL LOW (ref 3.87–5.11)
RDW: 17.1 % — ABNORMAL HIGH (ref 11.5–15.5)
WBC: 11.5 10*3/uL — ABNORMAL HIGH (ref 4.0–10.5)
nRBC: 0 % (ref 0.0–0.2)

## 2018-11-28 NOTE — Progress Notes (Signed)
Pulmonary Critical Care Medicine [place]   PULMONARY CRITICAL CARE SERVICE  PROGRESS NOTE  Date of Service: 11/28/2018  Catherine Williams  QIW:[id number]  DOB: [birth year]   DOA: 11/09/2018  Referring Physician: Merton Border, MD  HPI: Catherine Williams is a 57 y.o. female seen for follow up of Acute on Chronic Respiratory Failure.  Patient currently is on T collar has been on 28% oxygen doing fairly well.  We are going to work slowly towards capping trials.  Medications: Reviewed on Rounds  Physical Exam:  Vitals: Temperature 99.8 pulse 92 respiratory rate 20 blood pressure 108/52 saturations 96%  Ventilator Settings currently off the ventilator on T collar trials  . General: Comfortable at this time . Eyes: Grossly normal lids, irises & conjunctiva . ENT: grossly tongue is normal . Neck: no obvious mass . Cardiovascular: S1 S2 normal no gallop . Respiratory: No rhonchi or rales are noted . Abdomen: soft . Skin: no rash seen on limited exam . Musculoskeletal: not rigid . Psychiatric:unable to assess . Neurologic: no seizure no involuntary movements         Lab Data:   Basic Metabolic Panel: Recent Labs  Lab 11/21/18 1352 11/23/18 0501 11/26/18 0638 11/27/18 0529 11/28/18 0649  NA 135 134* 134* 134* 135  K 3.9 4.8 3.8 5.4* 3.7  CL 97* 95* 94* 95* 97*  CO2 24 24 26 25 24   GLUCOSE 132* 115* 136* 147* 130*  BUN 22* 37* 38* 26* 30*  CREATININE 2.66* 4.67* 5.91* 4.29* 5.50*  CALCIUM 9.0 9.3 9.2 9.1 9.4  PHOS 2.9 5.6* 6.1* 4.4 5.4*    ABG: No results for input(s): PHART, PCO2ART, PO2ART, HCO3, O2SAT in the last 168 hours.  Liver Function Tests: Recent Labs  Lab 11/21/18 1352 11/23/18 0501 11/26/18 4174 11/27/18 0529 11/28/18 0649  ALBUMIN 2.2* 1.9* 1.9* 1.9* 1.6*   No results for input(s): LIPASE, AMYLASE in the last 168 hours. No results for input(s): AMMONIA in the last 168 hours.  CBC: Recent Labs  Lab 11/21/18 1352  11/23/18 0501 11/26/18 0814 11/27/18 0529 11/28/18 0649  WBC 10.2 9.3 10.0 10.1 11.5*  HGB 7.8* 7.0* 6.8* 8.3* 7.5*  HCT 27.7* 24.3* 24.4* 29.1* 27.0*  MCV 91.7 90.0 93.5 90.4 92.8  PLT 287 284 355 287 349    Cardiac Enzymes: Recent Labs  Lab 11/24/18 1051 11/24/18 1409 11/24/18 1807  TROPONINI <0.03 <0.03 <0.03    BNP (last 3 results) Recent Labs    05/13/18 0333  BNP 78.9    ProBNP (last 3 results) No results for input(s): PROBNP in the last 8760 hours.  Radiological Exams: No results found.  Assessment/Plan Principal Problem:   Acute on chronic respiratory failure with hypoxia (HCC) Active Problems:   Acute on chronic diastolic CHF (congestive heart failure) (HCC)   CKD (chronic kidney disease) stage 5, GFR less than 15 ml/min (HCC)   Cardiac arrest (HCC)   End stage renal disease on dialysis ([place])   Healthcare-associated pneumonia   1. Acute on chronic respiratory failure with hypoxia we will continue with the T collar trials continue pulmonary toilet secretion management.  Hopefully we should be able to advance towards capping. 2. Acute on chronic diastolic heart failure we will continue supportive care monitor fluid status on dialysis. 3. Chronic kidney disease end-stage on renal dialysis will continue with supportive care 4. Cardiac arrest rhythm is stable we will monitor 5. Healthcare associated pneumonia treated we will continue with present management supportive care  I have personally seen and evaluated the patient, evaluated laboratory and imaging results, formulated the assessment and plan and placed orders. The Patient requires high complexity decision making for assessment and support.  Case was discussed on Rounds with the Respiratory Therapy Staff  Allyne Gee, MD [facility] Pulmonary Critical Care Medicine Sleep Medicine

## 2018-11-28 NOTE — Progress Notes (Signed)
[facility]  ROUNDING NOTE   Subjective:  Patient seen and evaluated at bedside. Due for dialysis today. Needs to be able to sit up for dialysis to have outpatient HD.   Objective:  Vital signs in last 24 hours:  Temperature nine 9.8 pulse 92 respirations 20 blood pressure 108/52  Physical Exam: General: No acute distress  Head: Normocephalic, atraumatic. Moist oral mucosal membranes  Eyes: Anicteric  Neck: Tracheostomy in place  Lungs:  Scattered rhonchi, normal effort  Heart: S1S2 no rubs  Abdomen:  Soft, nontender, bowel sounds present  Extremities: Trace peripheral edema.  Neurologic: Awake, alert, following commands  Skin: No lesions  Access: LUE AVF, L IJ PC    Basic Metabolic Panel: Recent Labs  Lab 11/21/18 1352 11/23/18 0501 11/26/18 0638 11/27/18 0529 11/28/18 0649  NA 135 134* 134* 134* 135  K 3.9 4.8 3.8 5.4* 3.7  CL 97* 95* 94* 95* 97*  CO2 24 24 26 25 24   GLUCOSE 132* 115* 136* 147* 130*  BUN 22* 37* 38* 26* 30*  CREATININE 2.66* 4.67* 5.91* 4.29* 5.50*  CALCIUM 9.0 9.3 9.2 9.1 9.4  PHOS 2.9 5.6* 6.1* 4.4 5.4*    Liver Function Tests: Recent Labs  Lab 11/21/18 1352 11/23/18 0501 11/26/18 0638 11/27/18 0529 11/28/18 0649  ALBUMIN 2.2* 1.9* 1.9* 1.9* 1.6*   No results for input(s): LIPASE, AMYLASE in the last 168 hours. No results for input(s): AMMONIA in the last 168 hours.  CBC: Recent Labs  Lab 11/21/18 1352 11/23/18 0501 11/26/18 8841 11/27/18 0529 11/28/18 0649  WBC 10.2 9.3 10.0 10.1 11.5*  HGB 7.8* 7.0* 6.8* 8.3* 7.5*  HCT 27.7* 24.3* 24.4* 29.1* 27.0*  MCV 91.7 90.0 93.5 90.4 92.8  PLT 287 284 355 287 349    Cardiac Enzymes: Recent Labs  Lab 11/24/18 1051 11/24/18 1409 11/24/18 1807  TROPONINI <0.03 <0.03 <0.03    BNP: Invalid input(s): POCBNP  CBG: No results for input(s): GLUCAP in the last 168 hours.  Microbiology: Results for orders placed or performed during the hospital encounter of  11/08/18  Aerobic/Anaerobic Culture (surgical/deep wound)     Status: None   Collection Time: 11/09/18  5:02 PM  Result Value Ref Range Status   Specimen Description GALL BLADDER  Final   Special Requests Normal  Final   Gram Stain NO WBC SEEN NO ORGANISMS SEEN   Final   Culture   Final    No growth aerobically or anaerobically. Performed at [facility], [place] [address], [place], [place] [address]    Report Status 11/15/2018 FINAL  Final    Coagulation Studies: No results for input(s): LABPROT, INR in the last 72 hours.  Urinalysis: No results for input(s): COLORURINE, LABSPEC, PHURINE, GLUCOSEU, HGBUR, BILIRUBINUR, KETONESUR, PROTEINUR, UROBILINOGEN, NITRITE, LEUKOCYTESUR in the last 72 hours.  Invalid input(s): APPERANCEUR    Imaging: No results found.   Medications:       Assessment/ Plan:  57 y.o. female with a PMHx of recurrent V. fib/PEA arrest, recent acute respiratory failure, COPD, obstructive sleep apnea, diastolic heart failure, multifactorial anemia, diabetes mellitus type 2, who was admitted to [facility] on 11/01/2018 for ongoing care.    1.  Acute renal failure/chronic kidney disease stage IV.  Patient dialysis dependent since late October.   -Patient most likely has ESRD.  She will need ongoing dialysis at this time.  We have prepared dialysis orders for today.  2.  Acute respiratory failure.  Pulmonary/critical care considering leaving tracheostomy  and long-term.  3.  Anemia chronic kidney disease.     Hemoglobin up to 7.5 posttransfusion.  4.  Secondary hyperparathyroidism.  Phosphorus was 5.4 today and remains acceptable.   LOS: 0 Isai Gottlieb 12/11/201911:14 AM

## 2018-11-29 ENCOUNTER — Other Ambulatory Visit (HOSPITAL_COMMUNITY): Payer: Self-pay

## 2018-11-29 DIAGNOSIS — J189 Pneumonia, unspecified organism: Secondary | ICD-10-CM | POA: Diagnosis not present

## 2018-11-29 LAB — BASIC METABOLIC PANEL
ANION GAP: 15 (ref 5–15)
BUN: 27 mg/dL — ABNORMAL HIGH (ref 6–20)
CO2: 26 mmol/L (ref 22–32)
CREATININE: 4.76 mg/dL — AB (ref 0.44–1.00)
Calcium: 9.6 mg/dL (ref 8.9–10.3)
Chloride: 98 mmol/L (ref 98–111)
GFR calc Af Amer: 11 mL/min — ABNORMAL LOW (ref >60)
GFR calc non Af Amer: 9 mL/min — ABNORMAL LOW (ref >60)
Glucose, Bld: 98 mg/dL (ref 70–99)
Potassium: 3.6 mmol/L (ref 3.5–5.1)
Sodium: 139 mmol/L (ref 135–145)

## 2018-11-29 LAB — BLOOD GAS, ARTERIAL
Acid-Base Excess: 1.6 mmol/L (ref 0.0–2.0)
Acid-Base Excess: 2.4 mmol/L — ABNORMAL HIGH (ref 0.0–2.0)
BICARBONATE: 27.6 mmol/L (ref 20.0–28.0)
Bicarbonate: 27.3 mmol/L (ref 20.0–28.0)
Drawn by: 519031
FIO2: 28
FIO2: 28
MECHVT: 400 mL
O2 SAT: 99.4 %
O2 SAT: 99.6 %
PATIENT TEMPERATURE: 100.9
PEEP: 5 cmH2O
Patient temperature: 100
RATE: 16 resp/min
pCO2 arterial: 58.6 mmHg — ABNORMAL HIGH (ref 32.0–48.0)
pCO2 arterial: 55.5 mmHg — ABNORMAL HIGH (ref 32.0–48.0)
pH, Arterial: 7.295 — ABNORMAL LOW (ref 7.350–7.450)
pH, Arterial: 7.326 — ABNORMAL LOW (ref 7.350–7.450)
pO2, Arterial: 164 mmHg — ABNORMAL HIGH (ref 83.0–108.0)
pO2, Arterial: 163 mmHg — ABNORMAL HIGH (ref 83.0–108.0)

## 2018-11-29 LAB — CBC
HCT: 25.9 % — ABNORMAL LOW (ref 36.0–46.0)
Hemoglobin: 7.3 g/dL — ABNORMAL LOW (ref 12.0–15.0)
MCH: 26.3 pg (ref 26.0–34.0)
MCHC: 28.2 g/dL — ABNORMAL LOW (ref 30.0–36.0)
MCV: 93.2 fL (ref 80.0–100.0)
Platelets: 312 10*3/uL (ref 150–400)
RBC: 2.78 MIL/uL — ABNORMAL LOW (ref 3.87–5.11)
RDW: 17.2 % — ABNORMAL HIGH (ref 11.5–15.5)
WBC: 7.4 10*3/uL (ref 4.0–10.5)
nRBC: 0 % (ref 0.0–0.2)

## 2018-11-29 NOTE — Progress Notes (Signed)
Pulmonary Critical Care Medicine [place]   PULMONARY CRITICAL CARE SERVICE  PROGRESS NOTE  Date of Service: 11/29/2018  Catherine Williams  XBJ:[id number]  DOB: December 21, 1960   DOA: 11/09/2018  Referring Physician: Merton Border, MD  HPI: KATIRA DUMAIS is a 57 y.o. female seen for follow up of Acute on Chronic Respiratory Failure.  Currently patient is on full support she had to be placed back on the ventilator this morning was fatigued and placed back on the vent she is resting now comfortably on assist control  Medications: Reviewed on Rounds  Physical Exam:  Vitals: Temperature 99.9 pulse 100 respiratory 18 blood pressure 124/59 saturations 97%  Ventilator Settings mode ventilation assist control FiO2 20% tidal volume 400 PEEP 5  . General: Comfortable at this time . Eyes: Grossly normal lids, irises & conjunctiva . ENT: grossly tongue is normal . Neck: no obvious mass . Cardiovascular: S1 S2 normal no gallop . Respiratory: Coarse rhonchi expansion is equal . Abdomen: soft . Skin: no rash seen on limited exam . Musculoskeletal: not rigid . Psychiatric:unable to assess . Neurologic: no seizure no involuntary movements         Lab Data:   Basic Metabolic Panel: Recent Labs  Lab 11/23/18 0501 11/26/18 0638 11/27/18 0529 11/28/18 0649  NA 134* 134* 134* 135  K 4.8 3.8 5.4* 3.7  CL 95* 94* 95* 97*  CO2 24 26 25 24   GLUCOSE 115* 136* 147* 130*  BUN 37* 38* 26* 30*  CREATININE 4.67* 5.91* 4.29* 5.50*  CALCIUM 9.3 9.2 9.1 9.4  PHOS 5.6* 6.1* 4.4 5.4*    ABG: Recent Labs  Lab 11/29/18 0749 11/29/18 1246  PHART 7.295* 7.326*  PCO2ART 58.6* 55.5*  PO2ART 164* 163*  HCO3 27.3 27.6  O2SAT 99.4 99.6    Liver Function Tests: Recent Labs  Lab 11/23/18 0501 11/26/18 0638 11/27/18 0529 11/28/18 0649  ALBUMIN 1.9* 1.9* 1.9* 1.6*   No results for input(s): LIPASE, AMYLASE in the last 168 hours. No results for input(s): AMMONIA in the last  168 hours.  CBC: Recent Labs  Lab 11/23/18 0501 11/26/18 0638 11/27/18 0529 11/28/18 0649  WBC 9.3 10.0 10.1 11.5*  HGB 7.0* 6.8* 8.3* 7.5*  HCT 24.3* 24.4* 29.1* 27.0*  MCV 90.0 93.5 90.4 92.8  PLT 284 355 287 349    Cardiac Enzymes: Recent Labs  Lab 11/24/18 1051 11/24/18 1409 11/24/18 1807  TROPONINI <0.03 <0.03 <0.03    BNP (last 3 results) Recent Labs    05/13/18 0333  BNP 78.9    ProBNP (last 3 results) No results for input(s): PROBNP in the last 8760 hours.  Radiological Exams: No results found.  Assessment/Plan Principal Problem:   Acute on chronic respiratory failure with hypoxia (HCC) Active Problems:   Acute on chronic diastolic CHF (congestive heart failure) (HCC)   CKD (chronic kidney disease) stage 5, GFR less than 15 ml/min (HCC)   Cardiac arrest (HCC)   End stage renal disease on dialysis ([place])   Healthcare-associated pneumonia   1. Acute on chronic respiratory failure with hypoxia we will continue with full support on assist control let her rest for the day.  Reassess again tomorrow continue secretion management 2. Acute on chronic diastolic heart failure continue with supportive care no recent chest x-ray done would consider follow-up chest film. 3. Chronic kidney disease stage V end-stage on dialysis followed by nephrology 4. Cardiac arrest rhythm is stable we will monitor 5. Healthcare associated pneumonia treated we  will continue to monitor   I have personally seen and evaluated the patient, evaluated laboratory and imaging results, formulated the assessment and plan and placed orders. The Patient requires high complexity decision making for assessment and support.  Case was discussed on Rounds with the Respiratory Therapy Staff  Allyne Gee, MD [facility] Pulmonary Critical Care Medicine Sleep Medicine

## 2018-11-30 ENCOUNTER — Other Ambulatory Visit (HOSPITAL_COMMUNITY): Payer: Self-pay

## 2018-11-30 DIAGNOSIS — Z4682 Encounter for fitting and adjustment of non-vascular catheter: Secondary | ICD-10-CM | POA: Diagnosis not present

## 2018-11-30 LAB — RENAL FUNCTION PANEL
Albumin: 1.6 g/dL — ABNORMAL LOW (ref 3.5–5.0)
Anion gap: 14 (ref 5–15)
BUN: 31 mg/dL — ABNORMAL HIGH (ref 6–20)
CO2: 26 mmol/L (ref 22–32)
Calcium: 9.4 mg/dL (ref 8.9–10.3)
Chloride: 98 mmol/L (ref 98–111)
Creatinine, Ser: 5.47 mg/dL — ABNORMAL HIGH (ref 0.44–1.00)
GFR calc Af Amer: 9 mL/min — ABNORMAL LOW (ref >60)
GFR calc non Af Amer: 8 mL/min — ABNORMAL LOW (ref >60)
Glucose, Bld: 96 mg/dL (ref 70–99)
Phosphorus: 4.3 mg/dL (ref 2.5–4.6)
Potassium: 3.5 mmol/L (ref 3.5–5.1)
Sodium: 138 mmol/L (ref 135–145)

## 2018-11-30 LAB — CBC
HCT: 25.5 % — ABNORMAL LOW (ref 36.0–46.0)
Hemoglobin: 7 g/dL — ABNORMAL LOW (ref 12.0–15.0)
MCH: 25.5 pg — ABNORMAL LOW (ref 26.0–34.0)
MCHC: 27.5 g/dL — ABNORMAL LOW (ref 30.0–36.0)
MCV: 93.1 fL (ref 80.0–100.0)
NRBC: 0 % (ref 0.0–0.2)
Platelets: 342 10*3/uL (ref 150–400)
RBC: 2.74 MIL/uL — ABNORMAL LOW (ref 3.87–5.11)
RDW: 17.3 % — ABNORMAL HIGH (ref 11.5–15.5)
WBC: 7.6 10*3/uL (ref 4.0–10.5)

## 2018-11-30 NOTE — Progress Notes (Signed)
Pulmonary Critical Care Medicine [place]   PULMONARY CRITICAL CARE SERVICE  PROGRESS NOTE  Date of Service: 11/30/2018  Catherine Williams  SWN:[id number]  DOB: 04/16/1961   DOA: 11/09/2018  Referring Physician: Merton Border, MD  HPI: Catherine Williams is a 57 y.o. female seen for follow up of Acute on Chronic Respiratory Failure.  Patient is on assist control mode at this time comfortable without distress has been on 28% oxygen PEEP is 5  Medications: Reviewed on Rounds  Physical Exam:  Vitals: Temperature 98.7 pulse 73 respiratory rate 16 blood pressure 105/40 saturations 100%  Ventilator Settings mode ventilation assist control FiO2 28% tidal volume 400 PEEP 5  . General: Comfortable at this time . Eyes: Grossly normal lids, irises & conjunctiva . ENT: grossly tongue is normal . Neck: no obvious mass . Cardiovascular: S1 S2 normal no gallop . Respiratory: No rhonchi or rales are noted at this time . Abdomen: soft . Skin: no rash seen on limited exam . Musculoskeletal: not rigid . Psychiatric:unable to assess . Neurologic: no seizure no involuntary movements         Lab Data:   Basic Metabolic Panel: Recent Labs  Lab 11/26/18 0638 11/27/18 0529 11/28/18 0649 11/29/18 2045 11/30/18 1003  NA 134* 134* 135 139 138  K 3.8 5.4* 3.7 3.6 3.5  CL 94* 95* 97* 98 98  CO2 26 25 24 26 26   GLUCOSE 136* 147* 130* 98 96  BUN 38* 26* 30* 27* 31*  CREATININE 5.91* 4.29* 5.50* 4.76* 5.47*  CALCIUM 9.2 9.1 9.4 9.6 9.4  PHOS 6.1* 4.4 5.4*  --  4.3    ABG: Recent Labs  Lab 11/29/18 0749 11/29/18 1246  PHART 7.295* 7.326*  PCO2ART 58.6* 55.5*  PO2ART 164* 163*  HCO3 27.3 27.6  O2SAT 99.4 99.6    Liver Function Tests: Recent Labs  Lab 11/26/18 0638 11/27/18 0529 11/28/18 0649 11/30/18 1003  ALBUMIN 1.9* 1.9* 1.6* 1.6*   No results for input(s): LIPASE, AMYLASE in the last 168 hours. No results for input(s): AMMONIA in the last 168  hours.  CBC: Recent Labs  Lab 11/26/18 0638 11/27/18 0529 11/28/18 0649 11/29/18 2045 11/30/18 1003  WBC 10.0 10.1 11.5* 7.4 7.6  HGB 6.8* 8.3* 7.5* 7.3* 7.0*  HCT 24.4* 29.1* 27.0* 25.9* 25.5*  MCV 93.5 90.4 92.8 93.2 93.1  PLT 355 287 349 312 342    Cardiac Enzymes: Recent Labs  Lab 11/24/18 1051 11/24/18 1409 11/24/18 1807  TROPONINI <0.03 <0.03 <0.03    BNP (last 3 results) Recent Labs    05/13/18 0333  BNP 78.9    ProBNP (last 3 results) No results for input(s): PROBNP in the last 8760 hours.  Radiological Exams: Dg Chest Port 1 View  Result Date: 11/29/2018 CLINICAL DATA:  Pneumonia. EXAM: PORTABLE CHEST 1 VIEW COMPARISON:  Radiograph of November 24, 2018. FINDINGS: Stable cardiomegaly. Tracheostomy and feeding tubes are unchanged in position. Left internal jugular dialysis catheter is unchanged. Mild central pulmonary vascular congestion with possible pulmonary edema is noted. No pneumothorax or pleural effusion is noted. Bony thorax is unremarkable. IMPRESSION: Stable support apparatus. Stable cardiomegaly with central pulmonary vascular congestion and and possible mild bilateral pulmonary edema. Electronically Signed   By: Marijo Conception, M.D.   On: 11/29/2018 14:56   Dg Abd Portable 1v  Result Date: 11/30/2018 CLINICAL DATA:  57 year old female with NG tube placement. EXAM: PORTABLE ABDOMEN - 1 VIEW COMPARISON:  CT of the abdomen pelvis  dated 11/16/2018 FINDINGS: An enteric tube is partially visualized with weighted tip in the right hemiabdomen likely in the distal stomach. Air is noted within the colon. IMPRESSION: Enteric tube with tip likely in the distal stomach. Electronically Signed   By: Anner Crete M.D.   On: 11/30/2018 01:30    Assessment/Plan Principal Problem:   Acute on chronic respiratory failure with hypoxia (HCC) Active Problems:   Acute on chronic diastolic CHF (congestive heart failure) (HCC)   CKD (chronic kidney disease) stage 5,  GFR less than 15 ml/min (HCC)   Cardiac arrest (HCC)   End stage renal disease on dialysis ([place])   Healthcare-associated pneumonia   1. Acute on chronic respiratory failure with hypoxia patient is back on the ventilator.  She had been doing well previously will reassess again.  Continue with secretion management pulmonary toilet. 2. Acute on chronic diastolic heart failure at baseline we will continue present therapy 3. Chronic kidney disease on dialysis followed by nephrology. 4. End-stage renal disease as above on dialysis 5. Healthcare associated pneumonia clinically improving chest but still had some pulmonary vascular congestion need to remove more fluid will discuss with nephrology   I have personally seen and evaluated the patient, evaluated laboratory and imaging results, formulated the assessment and plan and placed orders. The Patient requires high complexity decision making for assessment and support.  Case was discussed on Rounds with the Respiratory Therapy Staff  Allyne Gee, MD [facility] Pulmonary Critical Care Medicine Sleep Medicine

## 2018-11-30 NOTE — Progress Notes (Signed)
[facility]  ROUNDING NOTE   Subjective:  Patient seen and evaluated at bedside. Resting comfortably at the moment. Due for dialysis again today.   Objective:  Vital signs in last 24 hours:  Temperature 97.1 pulse 52 respirations 18 blood pressure 147/78  Physical Exam: General: No acute distress  Head: Normocephalic, atraumatic. Moist oral mucosal membranes  Eyes: Anicteric  Neck: Tracheostomy in place  Lungs:  Scattered rhonchi, normal effort  Heart: S1S2 no rubs  Abdomen:  Soft, nontender, bowel sounds present  Extremities: Trace peripheral edema.  Neurologic: Awake, alert, following commands  Skin: No lesions  Access: LUE AVF, L IJ PC    Basic Metabolic Panel: Recent Labs  Lab 11/26/18 0638 11/27/18 0529 11/28/18 0649 11/29/18 2045  NA 134* 134* 135 139  K 3.8 5.4* 3.7 3.6  CL 94* 95* 97* 98  CO2 26 25 24 26   GLUCOSE 136* 147* 130* 98  BUN 38* 26* 30* 27*  CREATININE 5.91* 4.29* 5.50* 4.76*  CALCIUM 9.2 9.1 9.4 9.6  PHOS 6.1* 4.4 5.4*  --     Liver Function Tests: Recent Labs  Lab 11/26/18 0638 11/27/18 0529 11/28/18 0649  ALBUMIN 1.9* 1.9* 1.6*   No results for input(s): LIPASE, AMYLASE in the last 168 hours. No results for input(s): AMMONIA in the last 168 hours.  CBC: Recent Labs  Lab 11/26/18 0638 11/27/18 0529 11/28/18 0649 11/29/18 2045  WBC 10.0 10.1 11.5* 7.4  HGB 6.8* 8.3* 7.5* 7.3*  HCT 24.4* 29.1* 27.0* 25.9*  MCV 93.5 90.4 92.8 93.2  PLT 355 287 349 312    Cardiac Enzymes: Recent Labs  Lab 11/24/18 1051 11/24/18 1409 11/24/18 1807  TROPONINI <0.03 <0.03 <0.03    BNP: Invalid input(s): POCBNP  CBG: No results for input(s): GLUCAP in the last 168 hours.  Microbiology: Results for orders placed or performed during the hospital encounter of 11/09/18  Culture, respiratory     Status: None (Preliminary result)   Collection Time: 11/29/18 12:27 PM  Result Value Ref Range Status   Specimen Description  TRACHEAL ASPIRATE  Final   Special Requests NONE  Final   Gram Stain   Final    FEW WBC PRESENT, PREDOMINANTLY PMN NO ORGANISMS SEEN    Culture   Final    CULTURE REINCUBATED FOR BETTER GROWTH Performed at [facility], [address], [place], [place] [address]    Report Status PENDING  Incomplete    Coagulation Studies: No results for input(s): LABPROT, INR in the last 72 hours.  Urinalysis: No results for input(s): COLORURINE, LABSPEC, PHURINE, GLUCOSEU, HGBUR, BILIRUBINUR, KETONESUR, PROTEINUR, UROBILINOGEN, NITRITE, LEUKOCYTESUR in the last 72 hours.  Invalid input(s): APPERANCEUR    Imaging: Dg Chest Port 1 View  Result Date: 11/29/2018 CLINICAL DATA:  Pneumonia. EXAM: PORTABLE CHEST 1 VIEW COMPARISON:  Radiograph of November 24, 2018. FINDINGS: Stable cardiomegaly. Tracheostomy and feeding tubes are unchanged in position. Left internal jugular dialysis catheter is unchanged. Mild central pulmonary vascular congestion with possible pulmonary edema is noted. No pneumothorax or pleural effusion is noted. Bony thorax is unremarkable. IMPRESSION: Stable support apparatus. Stable cardiomegaly with central pulmonary vascular congestion and and possible mild bilateral pulmonary edema. Electronically Signed   By: Marijo Conception, M.D.   On: 11/29/2018 14:56   Dg Abd Portable 1v  Result Date: 11/30/2018 CLINICAL DATA:  57 year old female with NG tube placement. EXAM: PORTABLE ABDOMEN - 1 VIEW COMPARISON:  CT of the abdomen pelvis dated 11/16/2018 FINDINGS: An  enteric tube is partially visualized with weighted tip in the right hemiabdomen likely in the distal stomach. Air is noted within the colon. IMPRESSION: Enteric tube with tip likely in the distal stomach. Electronically Signed   By: Anner Crete M.D.   On: 11/30/2018 01:30     Medications:       Assessment/ Plan:  57 y.o. female with a PMHx of recurrent V. fib/PEA arrest, recent acute respiratory failure, COPD,  obstructive sleep apnea, diastolic heart failure, multifactorial anemia, diabetes mellitus type 2, who was admitted to [facility] on 11/01/2018 for ongoing care.    1.  Acute renal failure/chronic kidney disease stage IV.  Patient dialysis dependent since late October.   -Patient remains dialysis dependent.  Dialysis orders for today have been prepared.  2.  Acute respiratory failure.  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  This may make placement for an outpatient dialysis unit more difficult.  3.  Anemia chronic kidney disease.     Hemoglobin currently 7.3.  Continue to monitor.  4.  Secondary hyperparathyroidism.  Repeat serum phosphorus today.   LOS: 0 Halee Glynn 12/13/20198:37 AM

## 2018-12-01 NOTE — Progress Notes (Addendum)
Pulmonary Critical Care Medicine [place]   PULMONARY CRITICAL CARE SERVICE  PROGRESS NOTE  Date of Service: 12/01/2018  Catherine Williams  HYW:[id number]  DOB: 21-Nov-1961   DOA: 11/09/2018  Referring Physician: Merton Border, MD  HPI: Catherine Williams is a 57 y.o. female seen for follow up of Acute on Chronic Respiratory Failure.  Patient is currently on trach collar at 28%.  She is doing well with moderate secretions.  Patient has had to go back on the ventilator in the last 48 hours.  Respiratory therapy mentions that when patient's cuff is deflated she tends to have issues.  Currently doing well on trach collar 28% at this time.  Medications: Reviewed on Rounds  Physical Exam:  Vitals: Pulse 79 rest 20 blood pressure 148/57 O2 saturation 100% temperature 99.0  Ventilator Settings patient is not currently on ventilator  . General: Comfortable at this time . Eyes: Grossly normal lids, irises & conjunctiva . ENT: grossly tongue is normal . Neck: no obvious mass . Cardiovascular: S1 S2 normal no gallop . Respiratory: Coarse breath sounds . Abdomen: soft . Skin: no rash seen on limited exam . Musculoskeletal: not rigid . Psychiatric:unable to assess . Neurologic: no seizure no involuntary movements         Lab Data:   Basic Metabolic Panel: Recent Labs  Lab 11/26/18 0638 11/27/18 0529 11/28/18 0649 11/29/18 2045 11/30/18 1003  NA 134* 134* 135 139 138  K 3.8 5.4* 3.7 3.6 3.5  CL 94* 95* 97* 98 98  CO2 26 25 24 26 26   GLUCOSE 136* 147* 130* 98 96  BUN 38* 26* 30* 27* 31*  CREATININE 5.91* 4.29* 5.50* 4.76* 5.47*  CALCIUM 9.2 9.1 9.4 9.6 9.4  PHOS 6.1* 4.4 5.4*  --  4.3    ABG: Recent Labs  Lab 11/29/18 0749 11/29/18 1246  PHART 7.295* 7.326*  PCO2ART 58.6* 55.5*  PO2ART 164* 163*  HCO3 27.3 27.6  O2SAT 99.4 99.6    Liver Function Tests: Recent Labs  Lab 11/26/18 0638 11/27/18 0529 11/28/18 0649 11/30/18 1003  ALBUMIN 1.9* 1.9*  1.6* 1.6*   No results for input(s): LIPASE, AMYLASE in the last 168 hours. No results for input(s): AMMONIA in the last 168 hours.  CBC: Recent Labs  Lab 11/26/18 0638 11/27/18 0529 11/28/18 0649 11/29/18 2045 11/30/18 1003  WBC 10.0 10.1 11.5* 7.4 7.6  HGB 6.8* 8.3* 7.5* 7.3* 7.0*  HCT 24.4* 29.1* 27.0* 25.9* 25.5*  MCV 93.5 90.4 92.8 93.2 93.1  PLT 355 287 349 312 342    Cardiac Enzymes: Recent Labs  Lab 11/24/18 1409 11/24/18 1807  TROPONINI <0.03 <0.03    BNP (last 3 results) Recent Labs    05/13/18 0333  BNP 78.9    ProBNP (last 3 results) No results for input(s): PROBNP in the last 8760 hours.  Radiological Exams: Dg Chest Port 1 View  Result Date: 11/29/2018 CLINICAL DATA:  Pneumonia. EXAM: PORTABLE CHEST 1 VIEW COMPARISON:  Radiograph of November 24, 2018. FINDINGS: Stable cardiomegaly. Tracheostomy and feeding tubes are unchanged in position. Left internal jugular dialysis catheter is unchanged. Mild central pulmonary vascular congestion with possible pulmonary edema is noted. No pneumothorax or pleural effusion is noted. Bony thorax is unremarkable. IMPRESSION: Stable support apparatus. Stable cardiomegaly with central pulmonary vascular congestion and and possible mild bilateral pulmonary edema. Electronically Signed   By: Marijo Conception, M.D.   On: 11/29/2018 14:56   Dg Abd Portable 1v  Result Date:  11/30/2018 CLINICAL DATA:  57 year old female with NG tube placement. EXAM: PORTABLE ABDOMEN - 1 VIEW COMPARISON:  CT of the abdomen pelvis dated 11/16/2018 FINDINGS: An enteric tube is partially visualized with weighted tip in the right hemiabdomen likely in the distal stomach. Air is noted within the colon. IMPRESSION: Enteric tube with tip likely in the distal stomach. Electronically Signed   By: Anner Crete M.D.   On: 11/30/2018 01:30    Assessment/Plan Principal Problem:   Acute on chronic respiratory failure with hypoxia (HCC) Active Problems:    Acute on chronic diastolic CHF (congestive heart failure) (HCC)   CKD (chronic kidney disease) stage 5, GFR less than 15 ml/min (HCC)   Cardiac arrest (HCC)   End stage renal disease on dialysis ([place])   Healthcare-associated pneumonia   1. Acute on chronic respiratory failure with hypoxia patient is on trach collar today and doing well.  We will continue as patient tolerates.  Continue aggressive pulmonary toilet and secretion management. 2. Acute on chronic diastolic heart failure at baseline continue present therapy 3. Chronic kidney disease on dialysis followed by nephrology 4. End-stage renal disease as above on dialysis 5. Healthcare associated pneumonia improved clinically nephrology assisting with pulmonary vascular congestion.  This patient was seen by Orson Gear AGNP-C in Collaboration with Dr. Devona Konig as a part of collaborative care agreement.    I have personally seen and evaluated the patient, evaluated laboratory and imaging results, formulated the assessment and plan and placed orders. The Patient requires high complexity decision making for assessment and support.  Case was discussed on Rounds with the Respiratory Therapy Staff  Allyne Gee, MD [facility] Pulmonary Critical Care Medicine Sleep Medicine

## 2018-12-02 LAB — CULTURE, RESPIRATORY W GRAM STAIN

## 2018-12-02 NOTE — Progress Notes (Addendum)
Pulmonary Critical Care Medicine [place]   PULMONARY CRITICAL CARE SERVICE  PROGRESS NOTE  Date of Service: 12/02/2018  Catherine Williams  TKP:[id number]  DOB: December 06, 1961   DOA: 11/09/2018  Referring Physician: Merton Border, MD  HPI: Catherine Williams is a 57 y.o. female seen for follow up of Acute on Chronic Respiratory Failure.  RT reports that patient went back on ventilator for tachypnea and tachycardia last night around midnight.  She failed an SBT this morning due to shallow respirations.  She currently remains on the ventilator full support at this time.  Medications: Reviewed on Rounds  Physical Exam:  Vitals: Pulse 67 respirations 16 blood pressure 149/60 O2 sat 100% temp 99.9  Ventilator Settings ventilator mode AC VC tidal volume 400 rate 16 PEEP 5 FiO2 28%  . General: Comfortable at this time . Eyes: Grossly normal lids, irises & conjunctiva . ENT: grossly tongue is normal . Neck: no obvious mass . Cardiovascular: S1 S2 normal no gallop . Respiratory: Coarse breath sounds . Abdomen: soft . Skin: no rash seen on limited exam . Musculoskeletal: not rigid . Psychiatric:unable to assess . Neurologic: no seizure no involuntary movements         Lab Data:   Basic Metabolic Panel: Recent Labs  Lab 11/26/18 0638 11/27/18 0529 11/28/18 0649 11/29/18 2045 11/30/18 1003  NA 134* 134* 135 139 138  K 3.8 5.4* 3.7 3.6 3.5  CL 94* 95* 97* 98 98  CO2 26 25 24 26 26   GLUCOSE 136* 147* 130* 98 96  BUN 38* 26* 30* 27* 31*  CREATININE 5.91* 4.29* 5.50* 4.76* 5.47*  CALCIUM 9.2 9.1 9.4 9.6 9.4  PHOS 6.1* 4.4 5.4*  --  4.3    ABG: Recent Labs  Lab 11/29/18 0749 11/29/18 1246  PHART 7.295* 7.326*  PCO2ART 58.6* 55.5*  PO2ART 164* 163*  HCO3 27.3 27.6  O2SAT 99.4 99.6    Liver Function Tests: Recent Labs  Lab 11/26/18 0638 11/27/18 0529 11/28/18 0649 11/30/18 1003  ALBUMIN 1.9* 1.9* 1.6* 1.6*   No results for input(s): LIPASE, AMYLASE  in the last 168 hours. No results for input(s): AMMONIA in the last 168 hours.  CBC: Recent Labs  Lab 11/26/18 0638 11/27/18 0529 11/28/18 0649 11/29/18 2045 11/30/18 1003  WBC 10.0 10.1 11.5* 7.4 7.6  HGB 6.8* 8.3* 7.5* 7.3* 7.0*  HCT 24.4* 29.1* 27.0* 25.9* 25.5*  MCV 93.5 90.4 92.8 93.2 93.1  PLT 355 287 349 312 342    Cardiac Enzymes: No results for input(s): CKTOTAL, CKMB, CKMBINDEX, TROPONINI in the last 168 hours.  BNP (last 3 results) Recent Labs    05/13/18 0333  BNP 78.9    ProBNP (last 3 results) No results for input(s): PROBNP in the last 8760 hours.  Radiological Exams: No results found.  Assessment/Plan Principal Problem:   Acute on chronic respiratory failure with hypoxia (HCC) Active Problems:   Acute on chronic diastolic CHF (congestive heart failure) (HCC)   CKD (chronic kidney disease) stage 5, GFR less than 15 ml/min (HCC)   Cardiac arrest (HCC)   End stage renal disease on dialysis ([place])   Healthcare-associated pneumonia   1. Acute on chronic respiratory failure with hypoxia patient went on ventilator at midnight last night and failed SBT this morning.  Patient could be having some fluid overload will get chest x-ray for evaluation. 2. Acute on chronic diastolic heart failure at baseline continue present therapy 3. Chronic kidney disease on dialysis followed by  nephrology 4. End-stage renal disease as above 5. Healthcare associated pneumonia improved clinically patient continues to have issues with pulmonary vascular congestion which is likely the cause of her needing the vent again at this time.  This patient was seen by Orson Gear AGNP-C in Collaboration with Dr. Devona Konig as a part of collaborative care agreement.    I have personally seen and evaluated the patient, evaluated laboratory and imaging results, formulated the assessment and plan and placed orders. The Patient requires high complexity decision making for assessment and  support.  Case was discussed on Rounds with the Respiratory Therapy Staff  Allyne Gee, MD [facility] Pulmonary Critical Care Medicine Sleep Medicine

## 2018-12-03 ENCOUNTER — Other Ambulatory Visit (HOSPITAL_COMMUNITY): Payer: Self-pay

## 2018-12-03 DIAGNOSIS — R05 Cough: Secondary | ICD-10-CM | POA: Diagnosis not present

## 2018-12-03 DIAGNOSIS — R0602 Shortness of breath: Secondary | ICD-10-CM | POA: Diagnosis not present

## 2018-12-03 LAB — CBC WITH DIFFERENTIAL/PLATELET
Abs Immature Granulocytes: 0.15 10*3/uL — ABNORMAL HIGH (ref 0.00–0.07)
Basophils Absolute: 0 10*3/uL (ref 0.0–0.1)
Basophils Relative: 0 %
Eosinophils Absolute: 0.5 10*3/uL (ref 0.0–0.5)
Eosinophils Relative: 4 %
HCT: 25.2 % — ABNORMAL LOW (ref 36.0–46.0)
Hemoglobin: 7.3 g/dL — ABNORMAL LOW (ref 12.0–15.0)
Immature Granulocytes: 1 %
Lymphocytes Relative: 12 %
Lymphs Abs: 1.3 10*3/uL (ref 0.7–4.0)
MCH: 26.3 pg (ref 26.0–34.0)
MCHC: 29 g/dL — ABNORMAL LOW (ref 30.0–36.0)
MCV: 90.6 fL (ref 80.0–100.0)
Monocytes Absolute: 0.7 10*3/uL (ref 0.1–1.0)
Monocytes Relative: 7 %
NEUTROS PCT: 76 %
NRBC: 0.2 % (ref 0.0–0.2)
Neutro Abs: 8.2 10*3/uL — ABNORMAL HIGH (ref 1.7–7.7)
Platelets: 358 10*3/uL (ref 150–400)
RBC: 2.78 MIL/uL — AB (ref 3.87–5.11)
RDW: 17.3 % — AB (ref 11.5–15.5)
WBC: 10.9 10*3/uL — AB (ref 4.0–10.5)

## 2018-12-03 LAB — COMPREHENSIVE METABOLIC PANEL
ALT: 36 U/L (ref 0–44)
AST: 79 U/L — ABNORMAL HIGH (ref 15–41)
Albumin: 1.5 g/dL — ABNORMAL LOW (ref 3.5–5.0)
Alkaline Phosphatase: 548 U/L — ABNORMAL HIGH (ref 38–126)
Anion gap: 15 (ref 5–15)
BUN: 36 mg/dL — ABNORMAL HIGH (ref 6–20)
CHLORIDE: 93 mmol/L — AB (ref 98–111)
CO2: 25 mmol/L (ref 22–32)
Calcium: 9.4 mg/dL (ref 8.9–10.3)
Creatinine, Ser: 6.13 mg/dL — ABNORMAL HIGH (ref 0.44–1.00)
GFR calc Af Amer: 8 mL/min — ABNORMAL LOW (ref >60)
GFR calc non Af Amer: 7 mL/min — ABNORMAL LOW (ref >60)
Glucose, Bld: 150 mg/dL — ABNORMAL HIGH (ref 70–99)
POTASSIUM: 3.2 mmol/L — AB (ref 3.5–5.1)
Sodium: 133 mmol/L — ABNORMAL LOW (ref 135–145)
Total Bilirubin: 1.6 mg/dL — ABNORMAL HIGH (ref 0.3–1.2)
Total Protein: 6.7 g/dL (ref 6.5–8.1)

## 2018-12-03 LAB — PROTIME-INR
INR: 1.01
Prothrombin Time: 13.2 seconds (ref 11.4–15.2)

## 2018-12-03 LAB — PHOSPHORUS: Phosphorus: 4.2 mg/dL (ref 2.5–4.6)

## 2018-12-03 NOTE — Progress Notes (Signed)
Pulmonary Critical Care Medicine [place]   PULMONARY CRITICAL CARE SERVICE  PROGRESS NOTE  Date of Service: 12/03/2018  Catherine Williams  ALP:[id number]  DOB: 1961-09-19   DOA: 11/09/2018  Referring Physician: Merton Border, MD  HPI: Catherine Williams is a 57 y.o. female seen for follow up of Acute on Chronic Respiratory Failure.  Patient is supposed to go back on the wean she is to do T collar for 24 hours.  Right now is resting comfortably without distress  Medications: Reviewed on Rounds  Physical Exam:  Vitals: Temperature 100.0 pulse 70 respiratory 18 blood pressure 151/77 saturations 100%  Ventilator Settings T collar FiO2 28%  . General: Comfortable at this time . Eyes: Grossly normal lids, irises & conjunctiva . ENT: grossly tongue is normal . Neck: no obvious mass . Cardiovascular: S1 S2 normal no gallop . Respiratory: No rhonchi or rales are noted at this time . Abdomen: soft . Skin: no rash seen on limited exam . Musculoskeletal: not rigid . Psychiatric:unable to assess . Neurologic: no seizure no involuntary movements         Lab Data:   Basic Metabolic Panel: Recent Labs  Lab 11/27/18 0529 11/28/18 0649 11/29/18 2045 11/30/18 1003 12/03/18 0443  NA 134* 135 139 138 133*  K 5.4* 3.7 3.6 3.5 3.2*  CL 95* 97* 98 98 93*  CO2 25 24 26 26 25   GLUCOSE 147* 130* 98 96 150*  BUN 26* 30* 27* 31* 36*  CREATININE 4.29* 5.50* 4.76* 5.47* 6.13*  CALCIUM 9.1 9.4 9.6 9.4 9.4  PHOS 4.4 5.4*  --  4.3 4.2    ABG: Recent Labs  Lab 11/29/18 0749 11/29/18 1246  PHART 7.295* 7.326*  PCO2ART 58.6* 55.5*  PO2ART 164* 163*  HCO3 27.3 27.6  O2SAT 99.4 99.6    Liver Function Tests: Recent Labs  Lab 11/27/18 0529 11/28/18 0649 11/30/18 1003 12/03/18 0443  AST  --   --   --  79*  ALT  --   --   --  36  ALKPHOS  --   --   --  548*  BILITOT  --   --   --  1.6*  PROT  --   --   --  6.7  ALBUMIN 1.9* 1.6* 1.6* 1.5*   No results for  input(s): LIPASE, AMYLASE in the last 168 hours. No results for input(s): AMMONIA in the last 168 hours.  CBC: Recent Labs  Lab 11/27/18 0529 11/28/18 0649 11/29/18 2045 11/30/18 1003 12/03/18 0443  WBC 10.1 11.5* 7.4 7.6 10.9*  NEUTROABS  --   --   --   --  8.2*  HGB 8.3* 7.5* 7.3* 7.0* 7.3*  HCT 29.1* 27.0* 25.9* 25.5* 25.2*  MCV 90.4 92.8 93.2 93.1 90.6  PLT 287 349 312 342 358    Cardiac Enzymes: No results for input(s): CKTOTAL, CKMB, CKMBINDEX, TROPONINI in the last 168 hours.  BNP (last 3 results) Recent Labs    05/13/18 0333  BNP 78.9    ProBNP (last 3 results) No results for input(s): PROBNP in the last 8760 hours.  Radiological Exams: Dg Chest Port 1 View  Result Date: 12/03/2018 CLINICAL DATA:  Shortness of breath and cough; acute on chronic respiratory failure. History of asthma-CHF-COPD. EXAM: PORTABLE CHEST 1 VIEW COMPARISON:  Portable chest x-ray of November 29, 2018 FINDINGS: The lungs are well-expanded. There is no focal infiltrate. The interstitial markings are less conspicuous today. There is no pleural effusion.  The heart is top-normal in size. The central pulmonary vascularity is prominent. The tracheostomy tube tip projects at the superior margin of the clavicular heads. The esophagogastric tube tip projects below the inferior margin of the image. The large caliber central venous dialysis catheter has its tip projecting over the midportion of the SVC. IMPRESSION: Slight interval improvement in the appearance of the pulmonary interstitium consistent with decreased edema. Minimal left basilar atelectasis. No pleural effusion. Electronically Signed   By: David  Martinique M.D.   On: 12/03/2018 07:33    Assessment/Plan Principal Problem:   Acute on chronic respiratory failure with hypoxia (HCC) Active Problems:   Acute on chronic diastolic CHF (congestive heart failure) (HCC)   CKD (chronic kidney disease) stage 5, GFR less than 15 ml/min (HCC)   Cardiac  arrest (HCC)   End stage renal disease on dialysis ([place])   Healthcare-associated pneumonia   1. Acute on chronic respiratory failure with hypoxia we will continue with advancing the wean on T collar once again.  The goal is for 24 hours. 2. Acute on chronic diastolic heart failure monitor fluid status.  The last chest x-ray showed some improvement in the interstitial with reduction of the edema 3. Chronic kidney disease end-stage renal disease on dialysis nephrology needs to continue to remove extra fluid she does fine as long as her fluid status is euvolemic. 4. Cardiac arrest rhythm is stable we will continue to monitor 5. Of care associated pneumonia treated we will continue to follow along   I have personally seen and evaluated the patient, evaluated laboratory and imaging results, formulated the assessment and plan and placed orders. The Patient requires high complexity decision making for assessment and support.  Case was discussed on Rounds with the Respiratory Therapy Staff  Allyne Gee, MD [facility] Pulmonary Critical Care Medicine Sleep Medicine

## 2018-12-03 NOTE — Progress Notes (Signed)
[facility]  ROUNDING NOTE   Subjective:  Patient sitting up in chair this a.m. Due for dialysis today. Still on the ventilator at this time.    Objective:  Vital signs in last 24 hours:  Temperature 100 pulse 70 respirations 18 blood pressure 151/77  Physical Exam: General: No acute distress  Head: Normocephalic, atraumatic. Moist oral mucosal membranes  Eyes: Anicteric  Neck: Tracheostomy in place  Lungs:  Scattered rhonchi, normal effort  Heart: S1S2 no rubs  Abdomen:  Soft, nontender, bowel sounds present  Extremities: Trace peripheral edema.  Neurologic: Awake, alert, following commands  Skin: No lesions  Access: LUE AVF, L IJ PC    Basic Metabolic Panel: Recent Labs  Lab 11/27/18 0529 11/28/18 0649 11/29/18 2045 11/30/18 1003 12/03/18 0443  NA 134* 135 139 138 133*  K 5.4* 3.7 3.6 3.5 3.2*  CL 95* 97* 98 98 93*  CO2 25 24 26 26 25   GLUCOSE 147* 130* 98 96 150*  BUN 26* 30* 27* 31* 36*  CREATININE 4.29* 5.50* 4.76* 5.47* 6.13*  CALCIUM 9.1 9.4 9.6 9.4 9.4  PHOS 4.4 5.4*  --  4.3 4.2    Liver Function Tests: Recent Labs  Lab 11/27/18 0529 11/28/18 0649 11/30/18 1003 12/03/18 0443  AST  --   --   --  79*  ALT  --   --   --  36  ALKPHOS  --   --   --  548*  BILITOT  --   --   --  1.6*  PROT  --   --   --  6.7  ALBUMIN 1.9* 1.6* 1.6* 1.5*   No results for input(s): LIPASE, AMYLASE in the last 168 hours. No results for input(s): AMMONIA in the last 168 hours.  CBC: Recent Labs  Lab 11/27/18 0529 11/28/18 0649 11/29/18 2045 11/30/18 1003 12/03/18 0443  WBC 10.1 11.5* 7.4 7.6 10.9*  NEUTROABS  --   --   --   --  8.2*  HGB 8.3* 7.5* 7.3* 7.0* 7.3*  HCT 29.1* 27.0* 25.9* 25.5* 25.2*  MCV 90.4 92.8 93.2 93.1 90.6  PLT 287 349 312 342 358    Cardiac Enzymes: No results for input(s): CKTOTAL, CKMB, CKMBINDEX, TROPONINI in the last 168 hours.  BNP: Invalid input(s): POCBNP  CBG: No results for input(s): GLUCAP in the last  168 hours.  Microbiology: Results for orders placed or performed during the hospital encounter of 11/09/18  Culture, respiratory     Status: None   Collection Time: 11/29/18 12:27 PM  Result Value Ref Range Status   Specimen Description TRACHEAL ASPIRATE  Final   Special Requests NONE  Final   Gram Stain   Final    FEW WBC PRESENT, PREDOMINANTLY PMN NO ORGANISMS SEEN Performed at [facility], [address] [place], [place] [address]    Culture FEW STAPHYLOCOCCUS LUGDUNENSIS  Final   Report Status 12/02/2018 FINAL  Final   Organism ID, Bacteria STAPHYLOCOCCUS LUGDUNENSIS  Final      Susceptibility   Staphylococcus lugdunensis - MIC*    CIPROFLOXACIN 4 RESISTANT Resistant     ERYTHROMYCIN >=8 RESISTANT Resistant     GENTAMICIN <=0.5 SENSITIVE Sensitive     OXACILLIN >=4 RESISTANT Resistant     TETRACYCLINE >=16 RESISTANT Resistant     TRIMETH/SULFA 80 RESISTANT Resistant     CLINDAMYCIN >=8 RESISTANT Resistant     RIFAMPIN <=0.5 SENSITIVE Sensitive     Inducible Clindamycin NEGATIVE Sensitive     *  FEW STAPHYLOCOCCUS LUGDUNENSIS  Culture, blood (routine x 2)     Status: None (Preliminary result)   Collection Time: 11/29/18  3:52 PM  Result Value Ref Range Status   Specimen Description BLOOD RIGHT THUMB  Final   Special Requests   Final    BOTTLES DRAWN AEROBIC ONLY Blood Culture adequate volume   Culture   Final    NO GROWTH 2 DAYS Performed at [facility], [address], [place], [place] [address]    Report Status PENDING  Incomplete  Culture, blood (routine x 2)     Status: None (Preliminary result)   Collection Time: 11/29/18  3:56 PM  Result Value Ref Range Status   Specimen Description BLOOD RIGHT HAND  Final   Special Requests   Final    BOTTLES DRAWN AEROBIC ONLY Blood Culture adequate volume   Culture   Final    NO GROWTH 2 DAYS Performed at [facility], [address] [place], [place] [address]    Report Status PENDING  Incomplete     Coagulation Studies: Recent Labs    12/03/18 0443  LABPROT 13.2  INR 1.01    Urinalysis: No results for input(s): COLORURINE, LABSPEC, PHURINE, GLUCOSEU, HGBUR, BILIRUBINUR, KETONESUR, PROTEINUR, UROBILINOGEN, NITRITE, LEUKOCYTESUR in the last 72 hours.  Invalid input(s): APPERANCEUR    Imaging: Dg Chest Port 1 View  Result Date: 12/03/2018 CLINICAL DATA:  Shortness of breath and cough; acute on chronic respiratory failure. History of asthma-CHF-COPD. EXAM: PORTABLE CHEST 1 VIEW COMPARISON:  Portable chest x-ray of November 29, 2018 FINDINGS: The lungs are well-expanded. There is no focal infiltrate. The interstitial markings are less conspicuous today. There is no pleural effusion. The heart is top-normal in size. The central pulmonary vascularity is prominent. The tracheostomy tube tip projects at the superior margin of the clavicular heads. The esophagogastric tube tip projects below the inferior margin of the image. The large caliber central venous dialysis catheter has its tip projecting over the midportion of the SVC. IMPRESSION: Slight interval improvement in the appearance of the pulmonary interstitium consistent with decreased edema. Minimal left basilar atelectasis. No pleural effusion. Electronically Signed   By: David  Martinique M.D.   On: 12/03/2018 07:33     Medications:       Assessment/ Plan:  57 y.o. female with a PMHx of recurrent V. fib/PEA arrest, recent acute respiratory failure, COPD, obstructive sleep apnea, diastolic heart failure, multifactorial anemia, diabetes mellitus type 2, who was admitted to [facility] on 11/01/2018 for ongoing care.    1.  Acute renal failure/chronic kidney disease stage IV.  Patient dialysis dependent since late October.   -Patient due for dialysis today.  Orders have been prepared for today.  Continue dialysis on MWF schedule.  2.  Acute respiratory failure.  Patient unfortunately transitioned back to the ventilator.  3.  Anemia  chronic kidney disease.     Hemoglobin 7.3 at last check.  Continue to monitor closely.  4.  Secondary hyperparathyroidism.  Phosphorus currently 4.2 and at target.  Continue to periodically monitor.   LOS: 0 Catherine Williams 12/16/20198:21 AM

## 2018-12-04 LAB — CBC
HEMATOCRIT: 25.9 % — AB (ref 36.0–46.0)
Hemoglobin: 7.5 g/dL — ABNORMAL LOW (ref 12.0–15.0)
MCH: 26.1 pg (ref 26.0–34.0)
MCHC: 29 g/dL — ABNORMAL LOW (ref 30.0–36.0)
MCV: 90.2 fL (ref 80.0–100.0)
Platelets: 366 10*3/uL (ref 150–400)
RBC: 2.87 MIL/uL — ABNORMAL LOW (ref 3.87–5.11)
RDW: 17.5 % — AB (ref 11.5–15.5)
WBC: 11.1 10*3/uL — AB (ref 4.0–10.5)
nRBC: 0.3 % — ABNORMAL HIGH (ref 0.0–0.2)

## 2018-12-04 LAB — CULTURE, BLOOD (ROUTINE X 2)
Culture: NO GROWTH
Culture: NO GROWTH
Special Requests: ADEQUATE
Special Requests: ADEQUATE

## 2018-12-04 NOTE — Consult Note (Signed)
Chief Complaint: Patient was seen in consultation today for percutaneous gastric tube placement at the request of Dr Saul Fordyce   Supervising Physician: Marybelle Killings  Patient Status: [facility] IP  History of Present Illness: Catherine Williams is a 57 y.o. female   Respiratory failure Hypoxia Trach collar CHF; Cardiac arrest ESRD PNA-- Tx ongoing Debilitation Deconditioning Dysphagia; Protein calorie malnutrition Need for long term care Request for percutaneous gastric tube placement  Imaging reviewed and approved for procedure     Past Medical History:  Diagnosis Date  . Acute on chronic respiratory failure with hypoxia ([place])   . Anemia   . Arthritis Dec. 2014   Gout  . Asthma   . Cardiac arrest ([place])   . CHF (congestive heart failure) ([place])   . Chronic diastolic heart failure of unknown etiology ([place])   . COPD (chronic obstructive pulmonary disease) ([place])   . Diabetes mellitus   . End stage renal disease on dialysis ([place])   . GERD (gastroesophageal reflux disease)   . Healthcare-associated pneumonia   . Heart failure   . Hyperlipidemia   . Hypertension   . Increased tracheal secretions   . Kidney disease   . Morbid obesity ([place])   . Peripheral vascular disease ([place])   . Pinched nerve   . Requires supplemental oxygen    as needed  . Sleep apnea    wears cpap  . Wears dentures     Past Surgical History:  Procedure Laterality Date  . Atherectomy and angioplasty  10/18/2011   left posterior tibial artery  . AV FISTULA PLACEMENT Left 10/04/2018   Procedure: ARTERIOVENOUS (AV) FISTULA CREATION LEFT ARM;  Surgeon: Serafina Mitchell, MD;  Location: [facility];  Service: Vascular;  Laterality: Left;  . COLONOSCOPY    . HAND SURGERY     right  . IR FLUORO GUIDE CV LINE LEFT  10/26/2018  . IR PERC CHOLECYSTOSTOMY  11/09/2018  . IR US GUIDE VASC ACCESS LEFT  10/26/2018  . LEFT HEART CATH AND CORONARY ANGIOGRAPHY N/A 10/19/2018   Procedure: LEFT HEART CATH AND CORONARY  ANGIOGRAPHY;  Surgeon: Leonie Man, MD;  Location: [facility];  Service: Cardiovascular;  Laterality: N/A;  . MULTIPLE TOOTH EXTRACTIONS    . OVARY SURGERY     Left  . TOE AMPUTATION  Sept. 25,2012   Left 4th and 5th toes    Allergies: Omnipaque [iohexol]  Medications: Prior to Admission medications   Medication Sig Start Date End Date Taking? Authorizing Provider  acetaminophen (TYLENOL) 160 MG/5ML solution Place 20.3 mLs (650 mg total) into feeding tube every 6 (six) hours as needed for mild pain or fever. Patient not taking: Reported on 11/08/2018 11/01/18   Kinnie Feil, MD  atorvastatin (LIPITOR) 10 MG tablet Take 10 mg by mouth daily.    [provider]  ciprofloxacin (CIPRO IN D5W) 400 MG/200ML SOLN Inject 400 mg into the vein every 12 (twelve) hours.    [provider]  dextrose 50 % solution Inject 50 mLs into the vein as needed for low blood sugar.    [provider]  Dextrose-Sodium Chloride (DEXTROSE 5 % AND 0.45% NACL) infusion Inject 1,000 mLs into the vein as needed.    [provider]  diphenhydrAMINE 25 mg in sodium chloride 0.9 % 50 mL Inject 25 mg into the vein every 6 (six) hours as needed (Itching).    [provider]  famotidine (PEPCID) 20 MG tablet Take 20  mg by mouth 2 (two) times daily.    [provider]  Heparin Sodium, Porcine, PF 5000 UNIT/ML SOLN Inject 5,000 Units as directed 2 (two) times daily.    [provider]  HYDROcodone-acetaminophen (NORCO/VICODIN) 5-325 MG tablet Take 1 tablet by mouth every 6 (six) hours as needed for moderate pain.    [provider]  insulin glargine (LANTUS) 100 UNIT/ML injection Inject 0.3 mLs (30 Units total) into the skin daily. 11/01/18   Kinnie Feil, MD  insulin lispro (HUMALOG) 100 UNIT/ML injection Inject 2-10 Units into the skin every 6 (six) hours. Sliding Scale\\   From 70-150 = 0 units           151-200= 2 units            201-250 = 4 units           251-300 = 6 units           301-350 = 8 units            351-400 = 10 units For Blood Glucose above 400, give 12 units CALL MD and obtain stat lab verification    [provider]  ipratropium-albuterol (DUONEB) 0.5-2.5 (3) MG/3ML SOLN Take 3 mLs by nebulization 3 (three) times daily. Patient taking differently: Take 3 mLs by nebulization as needed.  11/01/18   Kinnie Feil, MD  magnesium oxide (MAG-OX) 400 MG tablet Take 400 mg by mouth as needed.    [provider]  Magnesium Sulfate 1000 MG/1.6ML SOLN Inject 1 g into the vein as needed. In 100 Ml Drip rate 200 ml/h1    [provider]  metronidazole (FLAGYL) 500-0.74 MG/100ML-% Inject 500 mg into the vein every 8 (eight) hours. NaCl 0.79% Drip Rate : 100 ml/hr Give after dialysis and dialysis days    [provider]  midodrine (PROAMATINE) 2.5 MG tablet Take 1 tablet (2.5 mg total) by mouth 3 (three) times daily with meals. 11/01/18   Kinnie Feil, MD  morphine 2 MG/ML injection Inject 2 mg into the vein every 8 (eight) hours as needed.    [provider]  Multiple Vitamins-Minerals (MULTIVITAMINS THER. W/MINERALS) TABS tablet Take 1 tablet by mouth daily.    [provider]  Nutritional Supplements (FEEDING SUPPLEMENT, VITAL HIGH PROTEIN,) LIQD liquid Place 1,000 mLs into feeding tube continuous. 11/01/18   Kinnie Feil, MD  ondansetron (ZOFRAN) 4 MG/5ML solution Take 4 mg by mouth every 8 (eight) hours as needed for nausea or vomiting.    [provider]  phenylephrine (NEOSYNEPHRINE) 20-0.9 MG/250ML-% SOLN Inject 0-0.4 mg/min into the vein continuous. 11/09/18   Annita Brod, MD  polyethylene glycol [facility] / Floria Raveling) packet Take 17 g by mouth daily.    [provider]  Potassium Bicarb & Chloride (EFFERVESCENT POT CHLORIDE PO) Take 80 mEq by mouth as needed.    [provider]  potassium chloride 10 MEQ/100ML  Inject 10 mEq into the vein as needed (Drip Rate 4.167).    [provider]  sodium polystyrene (KAYEXALATE) 15 GM/60ML suspension Take 15-30 g by mouth as needed.    [provider]     Family History  Problem Relation Age of Onset  . Lung cancer Mother   . Clotting disorder Mother   . Heart disease Mother   . Cancer Mother        Lung  . Deep vein thrombosis Mother   . Diabetes Mother   .  Hypertension Mother   . Varicose Veins Mother   . Cancer Father   . Clotting disorder Sister   . Heart attack Sister   . Diabetes Sister   . Clotting disorder Brother   . Deep vein thrombosis Brother   . Diabetes Brother   . Heart disease Brother   . Heart disease Sister     Social History   Socioeconomic History  . Marital status: Single    Spouse name: Not on file  . Number of children: Y  . Years of education: Not on file  . Highest education level: Not on file  Occupational History  . 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  Social Needs  . Financial resource strain: Not on file  . Food insecurity:    Worry: Not on file    Inability: Not on file  . Transportation needs:    Medical: Not on file    Non-medical: Not on file  Tobacco Use  . Smoking status: Never Smoker  . Smokeless tobacco: Never Used  Substance and Sexual Activity  . Alcohol use: No    Alcohol/week: 0.0 standard drinks  . Drug use: Not Currently    Types: Marijuana    Comment: FORMER>>smoked weed from age 23 to 38>> quit at age 30   . Sexual activity: Not on file  Lifestyle  . Physical activity:    Days per week: Not on file    Minutes per session: Not on file  . Stress: Not on file  Relationships  . Social connections:    Talks on phone: Not on file    Gets together: Not on file    Attends religious service: Not on file    Active member of club or organization: Not on file    Attends meetings of clubs or organizations: Not on file      Relationship status: Not on file  Other Topics Concern  . Not on file  Social History Narrative   Lives with daughter          Review of Systems: A 12 point ROS discussed and pertinent positives are indicated in the HPI above.  All other systems are negative.  Review of Systems  Constitutional: Negative for fever.  Neurological: Positive for weakness.  Psychiatric/Behavioral: Positive for confusion.    Vital Signs: There were no vitals taken for this visit.  Physical Exam Vitals signs reviewed.  Cardiovascular:     Rate and Rhythm: Normal rate.  Pulmonary:     Breath sounds: Wheezing present.  Abdominal:     Palpations: Abdomen is soft.  Skin:    General: Skin is warm.  Psychiatric:     Comments: Consent in chart per staff Consented with POA     Imaging: Ct Abdomen Pelvis Wo Contrast  Result Date: 11/16/2018 CLINICAL DATA:  Left pleural effusion and sepsis. EXAM: CT CHEST, ABDOMEN AND PELVIS WITHOUT CONTRAST TECHNIQUE: Multidetector CT imaging of the chest, abdomen and pelvis was performed following the standard protocol without IV contrast. COMPARISON:  CT chest 10/25/2018. FINDINGS: CT CHEST FINDINGS Cardiovascular: Normal heart size. No pericardial effusion. Aortic atherosclerosis. 3 vessel coronary artery atherosclerotic calcifications noted. Mediastinum/Nodes: Tracheostomy tube tip terminates at the thoracic inlet wall of the carina. There is an enteric tube with tip in the body of stomach. No axillary, supraclavicular, mediastinal or hilar lymph nodes. Lungs/Pleura: Subsegmental atelectasis noted overlying the posterior and lateral right lung base. Mild patchy areas  of ground-glass attenuation are identified bilaterally with an upper lobe predominance, nonspecific. No airspace consolidation or pneumothorax. Musculoskeletal: Spondylosis identified within the thoracic spine. No acute or suspicious bone lesions identified. CT ABDOMEN PELVIS FINDINGS Hepatobiliary: No  focal liver abnormality identified. Percutaneous cholecystostomy tube is in place. Diffuse gallbladder wall thickening is identified. Gallstones identified within the gallbladder as before. No biliary ductal dilatation. Pancreas: Normal appearance of the pancreas. Spleen: Normal in size without focal abnormality. Adrenals/Urinary Tract: Normal adrenal glands. Right kidney cysts are again noted. No hydronephrosis identified bilaterally. The urinary bladder appears within normal limits. Stomach/Bowel: The stomach appears normal. No abnormal dilatation of the large or small bowel loops. No wall thickening or inflammatory changes identified. Within the lower abdominal wall there is a ventral abdominal wall hernia which contains nonobstructed loops of small bowel, similar to previous exam. Vascular/Lymphatic: Aortic atherosclerosis without aneurysm. Small retroperitoneal lymph nodes identified without adenopathy. No pelvic or inguinal adenopathy identified. Reproductive: Fibroid uterus is again noted. Ovarian structures are not confidently identified. Other: There is no free fluid or fluid collections identified. Marked laxity of the ventral abdominal wall is identified. Musculoskeletal: No acute or significant osseous findings. IMPRESSION: 1. Status post percutaneous cholecystostomy. There is diffuse gallbladder wall thickening and gallstones noted. Within the limitations of unenhanced technique no biliary ductal dilatation noted. 2. Mild subsegmental atelectasis within the posterolateral right lower lobe. No airspace consolidation. 3. Aortic Atherosclerosis (ICD10-I70.0). Multi vessel coronary artery atherosclerotic calcifications noted. 4. Lower ventral abdominal wall hernia contains nonobstructed loops of small bowel. Electronically Signed   By: Kerby Moors M.D.   On: 11/16/2018 17:46   Ct Abdomen Pelvis Wo Contrast  Result Date: 11/08/2018 CLINICAL DATA:  Unspecified abdominal pain. EXAM: CT ABDOMEN AND  PELVIS WITHOUT CONTRAST TECHNIQUE: Multidetector CT imaging of the abdomen and pelvis was performed following the standard protocol without IV contrast. COMPARISON:  CT pelvis 10/30/2018 FINDINGS: Lower chest: Consolidation or atelectasis in the right lung base. Small right pleural effusion. Cardiac enlargement. Coronary artery calcifications. Hepatobiliary: Multiple stones in the gallbladder. Gallbladder wall thickening with stranding in the pericholecystic fat. Changes likely to represent cholecystitis. No bile duct dilatation. No focal liver lesions. Pancreas: Unremarkable. No pancreatic ductal dilatation or surrounding inflammatory changes. Spleen: Normal in size without focal abnormality. Adrenals/Urinary Tract: No adrenal gland nodules. Multiple renal cysts on the right. No hydronephrosis or hydroureter. Bladder wall is not thickened. Stomach/Bowel: Stomach, small bowel, and colon are not abnormally distended. Enteric tube with tip in the stomach. Stool fills the colon. Vascular/Lymphatic: Prominent mesenteric and celiac axis vascular calcifications. No aneurysm. Reproductive: Uterus and ovaries are not enlarged. Gonadal vein phleboliths. Other: No free air or free fluid in the abdomen. Abdominal wall musculature is lax without any discrete hernia. Musculoskeletal: Degenerative changes in the spine. No destructive bone lesions. IMPRESSION: 1. Cholelithiasis with inflammatory changes in the gallbladder consistent with acute cholecystitis. 2. Consolidation or atelectasis in the right lung base with small right pleural effusion. Aortic Atherosclerosis (ICD10-I70.0). Electronically Signed   By: Lucienne Capers M.D.   On: 11/08/2018 01:32   Dg Abd 1 View  Result Date: 11/04/2018 CLINICAL DATA:  Right lower quadrant abdominal pain EXAM: ABDOMEN - 1 VIEW COMPARISON:  11/01/2018 FINDINGS: Feeding tube appears to loop within the stomach. The left upper quadrant is not visualized and the tip of the feeding tube is  not visualized. Nonobstructive bowel gas pattern. Moderate stool burden throughout the colon. Study limited by body habitus. No visible free air. IMPRESSION: Moderate stool  burden. No evidence of bowel obstruction or free air. Electronically Signed   By: Rolm Baptise M.D.   On: 11/04/2018 18:34   Ct Chest Wo Contrast  Result Date: 11/16/2018 CLINICAL DATA:  Left pleural effusion and sepsis. EXAM: CT CHEST, ABDOMEN AND PELVIS WITHOUT CONTRAST TECHNIQUE: Multidetector CT imaging of the chest, abdomen and pelvis was performed following the standard protocol without IV contrast. COMPARISON:  CT chest 10/25/2018. FINDINGS: CT CHEST FINDINGS Cardiovascular: Normal heart size. No pericardial effusion. Aortic atherosclerosis. 3 vessel coronary artery atherosclerotic calcifications noted. Mediastinum/Nodes: Tracheostomy tube tip terminates at the thoracic inlet wall of the carina. There is an enteric tube with tip in the body of stomach. No axillary, supraclavicular, mediastinal or hilar lymph nodes. Lungs/Pleura: Subsegmental atelectasis noted overlying the posterior and lateral right lung base. Mild patchy areas of ground-glass attenuation are identified bilaterally with an upper lobe predominance, nonspecific. No airspace consolidation or pneumothorax. Musculoskeletal: Spondylosis identified within the thoracic spine. No acute or suspicious bone lesions identified. CT ABDOMEN PELVIS FINDINGS Hepatobiliary: No focal liver abnormality identified. Percutaneous cholecystostomy tube is in place. Diffuse gallbladder wall thickening is identified. Gallstones identified within the gallbladder as before. No biliary ductal dilatation. Pancreas: Normal appearance of the pancreas. Spleen: Normal in size without focal abnormality. Adrenals/Urinary Tract: Normal adrenal glands. Right kidney cysts are again noted. No hydronephrosis identified bilaterally. The urinary bladder appears within normal limits. Stomach/Bowel: The stomach  appears normal. No abnormal dilatation of the large or small bowel loops. No wall thickening or inflammatory changes identified. Within the lower abdominal wall there is a ventral abdominal wall hernia which contains nonobstructed loops of small bowel, similar to previous exam. Vascular/Lymphatic: Aortic atherosclerosis without aneurysm. Small retroperitoneal lymph nodes identified without adenopathy. No pelvic or inguinal adenopathy identified. Reproductive: Fibroid uterus is again noted. Ovarian structures are not confidently identified. Other: There is no free fluid or fluid collections identified. Marked laxity of the ventral abdominal wall is identified. Musculoskeletal: No acute or significant osseous findings. IMPRESSION: 1. Status post percutaneous cholecystostomy. There is diffuse gallbladder wall thickening and gallstones noted. Within the limitations of unenhanced technique no biliary ductal dilatation noted. 2. Mild subsegmental atelectasis within the posterolateral right lower lobe. No airspace consolidation. 3. Aortic Atherosclerosis (ICD10-I70.0). Multi vessel coronary artery atherosclerotic calcifications noted. 4. Lower ventral abdominal wall hernia contains nonobstructed loops of small bowel. Electronically Signed   By: Kerby Moors M.D.   On: 11/16/2018 17:46   Ir Perc Cholecystostomy  Result Date: 11/09/2018 INDICATION: Acute calculus cholecystitis EXAM: Percutaneous cholecystostomy MEDICATIONS: 3.375 g Zosyn; The antibiotic was administered within an appropriate time frame prior to the initiation of the procedure. ANESTHESIA/SEDATION: Moderate (conscious) sedation was employed during this procedure. A total of Versed 1.5 mg and Fentanyl 75 mcg was administered intravenously. Moderate Sedation Time: 19 minutes. The patient's level of consciousness and vital signs were monitored continuously by radiology nursing throughout the procedure under my direct supervision. FLUOROSCOPY TIME:   Fluoroscopy Time: 0 minutes 18 seconds (4 mGy). COMPLICATIONS: None immediate. PROCEDURE: Informed written consent was obtained from the the patient and her family after a thorough discussion of the procedural risks, benefits and alternatives. All questions were addressed. Maximal Sterile Barrier Technique was utilized including caps, mask, sterile gowns, sterile gloves, sterile drape, hand hygiene and skin antiseptic. A timeout was performed prior to the initiation of the procedure. Previous imaging reviewed. Preliminary ultrasound performed. The distended thick-walled gallbladder was localized in the right upper quadrant. Overlying skin marked for a transhepatic approach.  Under sterile conditions and local anesthesia, a 21 gauge access needle was advanced percutaneously from a transhepatic approach into the gallbladder. Needle position confirmed with ultrasound. Images obtained for documentation. There was return of bile. Guidewire inserted followed by Accustick dilator set. Amplatz guidewire advanced. Tract dilatation performed to insert a 10 [place] drain. Drain catheter position confirmed with ultrasound and fluoroscopy. Syringe aspiration yielded 150 cc bile. Sample sent for culture. Catheter secured with Prolene suture and a sterile dressing. Gravity drainage bag connected. No immediate complication. Patient tolerated the procedure well. IMPRESSION: Successful ultrasound and fluoroscopic 10 French percutaneous cholecystostomy Electronically Signed   By: Jerilynn Mages.  Shick M.D.   On: 11/09/2018 17:09   Dg Chest Port 1 View  Result Date: 12/03/2018 CLINICAL DATA:  Shortness of breath and cough; acute on chronic respiratory failure. History of asthma-CHF-COPD. EXAM: PORTABLE CHEST 1 VIEW COMPARISON:  Portable chest x-ray of November 29, 2018 FINDINGS: The lungs are well-expanded. There is no focal infiltrate. The interstitial markings are less conspicuous today. There is no pleural effusion. The heart is top-normal  in size. The central pulmonary vascularity is prominent. The tracheostomy tube tip projects at the superior margin of the clavicular heads. The esophagogastric tube tip projects below the inferior margin of the image. The large caliber central venous dialysis catheter has its tip projecting over the midportion of the SVC. IMPRESSION: Slight interval improvement in the appearance of the pulmonary interstitium consistent with decreased edema. Minimal left basilar atelectasis. No pleural effusion. Electronically Signed   By: David  Martinique M.D.   On: 12/03/2018 07:33   Dg Chest Port 1 View  Result Date: 11/29/2018 CLINICAL DATA:  Pneumonia. EXAM: PORTABLE CHEST 1 VIEW COMPARISON:  Radiograph of November 24, 2018. FINDINGS: Stable cardiomegaly. Tracheostomy and feeding tubes are unchanged in position. Left internal jugular dialysis catheter is unchanged. Mild central pulmonary vascular congestion with possible pulmonary edema is noted. No pneumothorax or pleural effusion is noted. Bony thorax is unremarkable. IMPRESSION: Stable support apparatus. Stable cardiomegaly with central pulmonary vascular congestion and and possible mild bilateral pulmonary edema. Electronically Signed   By: Marijo Conception, M.D.   On: 11/29/2018 14:56   Dg Chest Port 1 View  Result Date: 11/24/2018 CLINICAL DATA:  Shortness of breath. EXAM: PORTABLE CHEST 1 VIEW COMPARISON:  11/16/2018 FINDINGS: Support lines and tubes in appropriate position. Mild cardiomegaly remains stable. Both lungs are clear. IMPRESSION: Stable mild cardiomegaly.  No active lung disease. Electronically Signed   By: Earle Gell M.D.   On: 11/24/2018 12:58   Dg Chest Port 1 View  Result Date: 11/16/2018 CLINICAL DATA:  57 year old female with a history breathing difficulty EXAM: PORTABLE CHEST 1 VIEW COMPARISON:  11/11/2018 FINDINGS: Cardiomediastinal silhouette unchanged in size and contour. Unchanged tracheostomy. Unchanged enteric feeding tube terminating  out of the field of view. Unchanged left IJ tunneled hemodialysis catheter terminating at the superior cavoatrial junction. Unchanged right IJ central catheter terminating at the upper right atrium. Lung volumes remain low. No pneumothorax. No large pleural effusion. Similar appearance of patchy opacities at the lung bases with no new confluent airspace disease. No interlobular septal thickening. IMPRESSION: Low lung volumes persist with likely atelectasis and no evidence of edema or new evidence of pneumonia. Unchanged right IJ central catheter, left IJ hemodialysis catheter, enteric feeding tube, and tracheostomy. Electronically Signed   By: Corrie Mckusick D.O.   On: 11/16/2018 11:39   Dg Chest Port 1 View  Result Date: 11/11/2018 CLINICAL DATA:  Follow-up central venous  line placement. EXAM: PORTABLE CHEST 1 VIEW COMPARISON:  Chest radiograph November 09, 2018 FINDINGS: RIGHT internal jugular central venous catheter distal tip projects in RIGHT atrium. Tunneled dialysis catheter via LEFT subclavian venous approach with distal tip projecting distal superior vena cava. Tracheostomy tube in place. Feeding tube past GE junction, distal tip out of field of view. Stable cardiomegaly and pulmonary vascular congestion. No pleural effusion or focal consolidation. No pneumothorax. Soft tissue planes and included osseous structures are unchanged. IMPRESSION: 1. RIGHT internal jugular central venous catheter tip projects in RIGHT atrium, recommend 2 cm retraction. Otherwise stable appearance of life support lines. 2. Stable cardiomegaly and pulmonary vascular congestion. Electronically Signed   By: Elon Alas M.D.   On: 11/11/2018 21:09   Portable Chest Xray  Result Date: 11/09/2018 CLINICAL DATA:  Tracheostomy, respiratory failure EXAM: PORTABLE CHEST 1 VIEW COMPARISON:  11/05/2018 FINDINGS: Cardiomegaly with vascular congestion and bibasilar atelectasis. No overt edema. Support devices are stable.  IMPRESSION: Cardiomegaly, vascular congestion and bibasilar atelectasis. Electronically Signed   By: Rolm Baptise M.D.   On: 11/09/2018 08:47   Dg Chest Port 1 View  Result Date: 11/05/2018 CLINICAL DATA:  Pneumonia EXAM: PORTABLE CHEST 1 VIEW COMPARISON:  11/01/2018 FINDINGS: Tracheostomy tube, feeding tube, and left subclavian dialysis catheter remain in place. Low lung volumes are present, causing crowding of the pulmonary vasculature. Interstitial accentuation persists. Indistinct right perihilar airspace opacity. Subsegmental atelectasis at the left lung base. No blunting of the costophrenic angles. IMPRESSION: 1. Indistinct perihilar airspace opacity on the right, potentially from resolving edema. Interstitial accentuation noted bilaterally. Overall the appearance is mildly improved from 11/01/2018. 2. Low lung volumes. Electronically Signed   By: Van Clines M.D.   On: 11/05/2018 17:06   Dg Abd Portable 1v  Result Date: 11/30/2018 CLINICAL DATA:  57 year old female with NG tube placement. EXAM: PORTABLE ABDOMEN - 1 VIEW COMPARISON:  CT of the abdomen pelvis dated 11/16/2018 FINDINGS: An enteric tube is partially visualized with weighted tip in the right hemiabdomen likely in the distal stomach. Air is noted within the colon. IMPRESSION: Enteric tube with tip likely in the distal stomach. Electronically Signed   By: Anner Crete M.D.   On: 11/30/2018 01:30   Dg Abd Portable 1v  Result Date: 11/09/2018 CLINICAL DATA:  Nasogastric tube placement. Recent cholecystostomy tube placement. EXAM: PORTABLE ABDOMEN - 1 VIEW COMPARISON:  Abdominal radiograph November 07, 2018 FINDINGS: Feeding tube tip projects in proximal stomach. Gas distended stomach. Included bowel gas pattern is nondilated and nonobstructive. Pigtail drainage catheter RIGHT mid abdomen. Calcified splenic artery. Soft tissue planes included osseous structures are unchanged, large body habitus. IMPRESSION: Feeding tube tip  projects in mid stomach.  Gas distended stomach. RIGHT mid abdomen pigtail catheter cholecystostomy tube. Electronically Signed   By: Elon Alas M.D.   On: 11/09/2018 21:30   Dg Abd Portable 1v  Result Date: 11/07/2018 CLINICAL DATA:  57 y/o  F; feeding tube position. EXAM: PORTABLE ABDOMEN - 1 VIEW COMPARISON:  11/04/2018 abdomen radiographs. FINDINGS: Enteric tube tip projects over the proximal gastric body. Mild gastric distention. Moderate volume of stool in the colon. Bones are unremarkable. IMPRESSION: Enteric tube tip projects over the proximal gastric body. Electronically Signed   By: Kristine Garbe M.D.   On: 11/07/2018 06:17   US Abdomen Limited Ruq  Result Date: 11/08/2018 CLINICAL DATA:  Right upper quadrant pain EXAM: ULTRASOUND ABDOMEN LIMITED RIGHT UPPER QUADRANT COMPARISON:  11/08/2018 CT FINDINGS: Gallbladder: Distended gallbladder with dependent calculi.  Mural thickening up to 6 mm. Positive sonographic Murphy's is noted. The largest calcification measures 4.6 mm. There is biliary sludge admixed with the stones. Common bile duct: Diameter: 4.3 mm and normal without choledocholithiasis. Liver: Suboptimal assessment of the liver due to patient inability to move appropriately for optimal imaging. No definite space-occupying mass. Portal vein is patent on color Doppler imaging with normal direction of blood flow towards the liver. IMPRESSION: Findings suggest acute cholecystitis with biliary sludge and mobile calculi noted. Positive sonographic Murphy's with gallbladder distention and mural thickening of the gallbladder wall to 6 mm in thickness is noted. Electronically Signed   By: Ashley Royalty M.D.   On: 11/08/2018 19:24    Labs:  CBC: Recent Labs    11/29/18 2045 11/30/18 1003 12/03/18 0443 12/04/18 0538  WBC 7.4 7.6 10.9* 11.1*  HGB 7.3* 7.0* 7.3* 7.5*  HCT 25.9* 25.5* 25.2* 25.9*  PLT 312 342 358 366    COAGS: Recent Labs    10/19/18 1147   10/24/18 0243  10/28/18 0632 10/29/18 0327 10/30/18 0247 10/31/18 0319 11/08/18 1546 12/03/18 0443  INR 1.24  --  1.19  --   --   --   --   --  1.19 1.01  APTT  --    < > 93*   < > 32 33 33 31  --   --    < > = values in this interval not displayed.    BMP: Recent Labs    11/28/18 0649 11/29/18 2045 11/30/18 1003 12/03/18 0443  NA 135 139 138 133*  K 3.7 3.6 3.5 3.2*  CL 97* 98 98 93*  CO2 24 26 26 25   GLUCOSE 130* 98 96 150*  BUN 30* 27* 31* 36*  CALCIUM 9.4 9.6 9.4 9.4  CREATININE 5.50* 4.76* 5.47* 6.13*  GFRNONAA 8* 9* 8* 7*  GFRAA 9* 11* 9* 8*    LIVER FUNCTION TESTS: Recent Labs    10/12/18 2013  10/19/18 1555  11/08/18 1121  11/27/18 0529 11/28/18 0649 11/30/18 1003 12/03/18 0443  BILITOT 0.6  --  0.5  --  0.9  --   --   --   --  1.6*  AST 75*  --  72*  --  115*  --   --   --   --  79*  ALT 42  --  70*  --  101*  --   --   --   --  36  ALKPHOS 94  --  200*  --  354*  --   --   --   --  548*  PROT 6.8  --  7.2  --  7.4  --   --   --   --  6.7  ALBUMIN 2.3*   < > 2.0*   < > 1.6*   < > 1.9* 1.6* 1.6* 1.5*   < > = values in this interval not displayed.    TUMOR MARKERS: No results for input(s): AFPTM, CEA, CA199, CHROMGRNA in the last 8760 hours.  Assessment and Plan:  Deconditioning Dysphagia PCM Need for long term care Scheduled for percutaneous gastric tube placement in IR prob 12/18 Will recheck Hg (7.0-7.3) Risks and benefits discussed with the patient's POA per RN including, but not limited to the need for a barium enema during the procedure, bleeding, infection, peritonitis, or damage to adjacent structures.  All of the patient's questions were answered, POA is agreeable to proceed. Consent signed and in chart.  Thank you for this interesting consult.  I greatly enjoyed meeting Catherine Williams and look forward to participating in their care.  A copy of this report was sent to the requesting provider on this date.  Electronically  Signed: Lavonia Drafts, PA-C 12/04/2018, 6:16 AM   I spent a total of 40 Minutes    in face to face in clinical consultation, greater than 50% of which was counseling/coordinating care for percutaneous gastric tube placement

## 2018-12-04 NOTE — Progress Notes (Signed)
Pulmonary Critical Care Medicine [place]   PULMONARY CRITICAL CARE SERVICE  PROGRESS NOTE  Date of Service: 12/04/2018  Catherine Williams  [id number]  DOB: 07-02-1961   DOA: 11/09/2018  Referring Physician: Merton Border, MD  HPI: Catherine Williams is a 57 y.o. female seen for follow up of Acute on Chronic Respiratory Failure.  Patient remains on the ventilator full support.  She has had some issues with congestive heart failure the last chest x-ray had shown some improvement.  Reviewed her echocardiogram done back in October which showed grade 2 diastolic dysfunction.  Unfortunately because of renal failure she needs to have fluid removed by that route.  I have suggested getting a follow-up echocardiogram to see if there is been any changes in her cardiac status  Medications: Reviewed on Rounds  Physical Exam:  Vitals: Temperature is 98.9 pulse 86 respiratory rate 18 blood pressure 117/62 saturations 97%  Ventilator Settings mode ventilation assist control FiO2 28% PEEP 5 rate 16 tidal volume 400  . General: Comfortable at this time . Eyes: Grossly normal lids, irises & conjunctiva . ENT: grossly tongue is normal . Neck: no obvious mass . Cardiovascular: S1 S2 normal no gallop . Respiratory: No rhonchi or rales are noted at this time . Abdomen: soft . Skin: no rash seen on limited exam . Musculoskeletal: not rigid . Psychiatric:unable to assess . Neurologic: no seizure no involuntary movements         Lab Data:   Basic Metabolic Panel: Recent Labs  Lab 11/28/18 0649 11/29/18 2045 11/30/18 1003 12/03/18 0443  NA 135 139 138 133*  K 3.7 3.6 3.5 3.2*  CL 97* 98 98 93*  CO2 24 26 26 25   GLUCOSE 130* 98 96 150*  BUN 30* 27* 31* 36*  CREATININE 5.50* 4.76* 5.47* 6.13*  CALCIUM 9.4 9.6 9.4 9.4  PHOS 5.4*  --  4.3 4.2    ABG: Recent Labs  Lab 11/29/18 0749 11/29/18 1246  PHART 7.295* 7.326*  PCO2ART 58.6* 55.5*  PO2ART 164* 163*  HCO3 27.3  27.6  O2SAT 99.4 99.6    Liver Function Tests: Recent Labs  Lab 11/28/18 0649 11/30/18 1003 12/03/18 0443  AST  --   --  79*  ALT  --   --  36  ALKPHOS  --   --  548*  BILITOT  --   --  1.6*  PROT  --   --  6.7  ALBUMIN 1.6* 1.6* 1.5*   No results for input(s): LIPASE, AMYLASE in the last 168 hours. No results for input(s): AMMONIA in the last 168 hours.  CBC: Recent Labs  Lab 11/28/18 0649 11/29/18 2045 11/30/18 1003 12/03/18 0443 12/04/18 0538  WBC 11.5* 7.4 7.6 10.9* 11.1*  NEUTROABS  --   --   --  8.2*  --   HGB 7.5* 7.3* 7.0* 7.3* 7.5*  HCT 27.0* 25.9* 25.5* 25.2* 25.9*  MCV 92.8 93.2 93.1 90.6 90.2  PLT 349 312 342 358 366    Cardiac Enzymes: No results for input(s): CKTOTAL, CKMB, CKMBINDEX, TROPONINI in the last 168 hours.  BNP (last 3 results) Recent Labs    05/13/18 0333  BNP 78.9    ProBNP (last 3 results) No results for input(s): PROBNP in the last 8760 hours.  Radiological Exams: Dg Chest Port 1 View  Result Date: 12/03/2018 CLINICAL DATA:  Shortness of breath and cough; acute on chronic respiratory failure. History of asthma-CHF-COPD. EXAM: PORTABLE CHEST 1 VIEW COMPARISON:  Portable chest x-ray of November 29, 2018 FINDINGS: The lungs are well-expanded. There is no focal infiltrate. The interstitial markings are less conspicuous today. There is no pleural effusion. The heart is top-normal in size. The central pulmonary vascularity is prominent. The tracheostomy tube tip projects at the superior margin of the clavicular heads. The esophagogastric tube tip projects below the inferior margin of the image. The large caliber central venous dialysis catheter has its tip projecting over the midportion of the SVC. IMPRESSION: Slight interval improvement in the appearance of the pulmonary interstitium consistent with decreased edema. Minimal left basilar atelectasis. No pleural effusion. Electronically Signed   By: David  Martinique M.D.   On: 12/03/2018 07:33     Assessment/Plan Principal Problem:   Acute on chronic respiratory failure with hypoxia (HCC) Active Problems:   Acute on chronic diastolic CHF (congestive heart failure) (HCC)   CKD (chronic kidney disease) stage 5, GFR less than 15 ml/min (HCC)   Cardiac arrest (HCC)   End stage renal disease on dialysis ([place])   Healthcare-associated pneumonia   1. Acute on chronic respiratory failure with hypoxia we will continue with full vent support and assess the RSB I for weaning potential.  We will continue with pulmonary toilet supportive care. 2. Chronic kidney disease stage V we will continue with present management 3. Cardiac arrest rhythm is stable echocardiogram was done to assess LV function 4. End-stage renal disease on hemodialysis we will continue with present therapy 5. Healthcare associated pneumonia treated we willFollow-up on x-ray which showed some improvement from the previous film 6. Acute on chronic diastolic heart failure grade 2 based on the last echo will reassess with a follow-up echocardiogram   I have personally seen and evaluated the patient, evaluated laboratory and imaging results, formulated the assessment and plan and placed orders. The Patient requires high complexity decision making for assessment and support.  Case was discussed on Rounds with the Respiratory Therapy Staff  Allyne Gee, MD [facility] Pulmonary Critical Care Medicine Sleep Medicine

## 2018-12-05 ENCOUNTER — Encounter (HOSPITAL_COMMUNITY): Payer: Self-pay | Admitting: Interventional Radiology

## 2018-12-05 ENCOUNTER — Ambulatory Visit (HOSPITAL_COMMUNITY): Payer: Medicare Other | Attending: Internal Medicine

## 2018-12-05 ENCOUNTER — Other Ambulatory Visit (HOSPITAL_COMMUNITY): Payer: Self-pay

## 2018-12-05 DIAGNOSIS — I5033 Acute on chronic diastolic (congestive) heart failure: Secondary | ICD-10-CM

## 2018-12-05 DIAGNOSIS — J449 Chronic obstructive pulmonary disease, unspecified: Secondary | ICD-10-CM | POA: Diagnosis not present

## 2018-12-05 DIAGNOSIS — I5031 Acute diastolic (congestive) heart failure: Secondary | ICD-10-CM | POA: Insufficient documentation

## 2018-12-05 DIAGNOSIS — I110 Hypertensive heart disease with heart failure: Secondary | ICD-10-CM | POA: Diagnosis not present

## 2018-12-05 DIAGNOSIS — Z8674 Personal history of sudden cardiac arrest: Secondary | ICD-10-CM | POA: Insufficient documentation

## 2018-12-05 DIAGNOSIS — E119 Type 2 diabetes mellitus without complications: Secondary | ICD-10-CM | POA: Diagnosis not present

## 2018-12-05 DIAGNOSIS — R1310 Dysphagia, unspecified: Secondary | ICD-10-CM | POA: Diagnosis not present

## 2018-12-05 HISTORY — PX: IR GASTROSTOMY TUBE MOD SED: IMG625

## 2018-12-05 LAB — RENAL FUNCTION PANEL
Albumin: 1.6 g/dL — ABNORMAL LOW (ref 3.5–5.0)
Anion gap: 13 (ref 5–15)
BUN: 28 mg/dL — ABNORMAL HIGH (ref 6–20)
CHLORIDE: 94 mmol/L — AB (ref 98–111)
CO2: 27 mmol/L (ref 22–32)
Calcium: 9.5 mg/dL (ref 8.9–10.3)
Creatinine, Ser: 5.39 mg/dL — ABNORMAL HIGH (ref 0.44–1.00)
GFR calc Af Amer: 9 mL/min — ABNORMAL LOW (ref >60)
GFR calc non Af Amer: 8 mL/min — ABNORMAL LOW (ref >60)
Glucose, Bld: 110 mg/dL — ABNORMAL HIGH (ref 70–99)
POTASSIUM: 3 mmol/L — AB (ref 3.5–5.1)
Phosphorus: 4.5 mg/dL (ref 2.5–4.6)
Sodium: 134 mmol/L — ABNORMAL LOW (ref 135–145)

## 2018-12-05 LAB — VANCOMYCIN, TROUGH: Vancomycin Tr: 21 ug/mL (ref 15–20)

## 2018-12-05 LAB — CBC
HCT: 24.5 % — ABNORMAL LOW (ref 36.0–46.0)
Hemoglobin: 7 g/dL — ABNORMAL LOW (ref 12.0–15.0)
MCH: 25.7 pg — ABNORMAL LOW (ref 26.0–34.0)
MCHC: 28.6 g/dL — ABNORMAL LOW (ref 30.0–36.0)
MCV: 90.1 fL (ref 80.0–100.0)
Platelets: 411 10*3/uL — ABNORMAL HIGH (ref 150–400)
RBC: 2.72 MIL/uL — ABNORMAL LOW (ref 3.87–5.11)
RDW: 17.6 % — ABNORMAL HIGH (ref 11.5–15.5)
WBC: 11.9 10*3/uL — ABNORMAL HIGH (ref 4.0–10.5)
nRBC: 0.3 % — ABNORMAL HIGH (ref 0.0–0.2)

## 2018-12-05 LAB — ECHOCARDIOGRAM COMPLETE

## 2018-12-05 MED ORDER — MIDAZOLAM HCL 2 MG/2ML IJ SOLN
INTRAMUSCULAR | Status: DC | PRN
  Administered 2018-12-05: 0.5 mg via INTRAVENOUS
  Administered 2018-12-05: 1 mg via INTRAVENOUS

## 2018-12-05 MED ORDER — FENTANYL CITRATE (PF) 100 MCG/2ML IJ SOLN
INTRAMUSCULAR | Status: AC
  Filled 2018-12-05: qty 2

## 2018-12-05 MED ORDER — LIDOCAINE HCL (PF) 1 % IJ SOLN
INTRAMUSCULAR | Status: DC | PRN
  Administered 2018-12-05: 5 mL

## 2018-12-05 MED ORDER — MIDAZOLAM HCL 2 MG/2ML IJ SOLN
INTRAMUSCULAR | Status: AC
  Filled 2018-12-05: qty 2

## 2018-12-05 MED ORDER — CEFAZOLIN SODIUM-DEXTROSE 1-4 GM/50ML-% IV SOLN
INTRAVENOUS | Status: DC | PRN
  Administered 2018-12-05: 1 g via INTRAVENOUS

## 2018-12-05 MED ORDER — GLUCAGON HCL RDNA (DIAGNOSTIC) 1 MG IJ SOLR
INTRAMUSCULAR | Status: DC | PRN
  Administered 2018-12-05: 1 mg via INTRAVENOUS

## 2018-12-05 MED ORDER — IOPAMIDOL (ISOVUE-300) INJECTION 61%
INTRAVENOUS | Status: AC
  Administered 2018-12-05: 5 mL
  Filled 2018-12-05: qty 50

## 2018-12-05 MED ORDER — FENTANYL CITRATE (PF) 100 MCG/2ML IJ SOLN
INTRAMUSCULAR | Status: DC | PRN
  Administered 2018-12-05: 50 ug via INTRAVENOUS
  Administered 2018-12-05: 25 ug via INTRAVENOUS

## 2018-12-05 MED ORDER — LIDOCAINE HCL 1 % IJ SOLN
INTRAMUSCULAR | Status: AC
  Filled 2018-12-05: qty 20

## 2018-12-05 MED ORDER — GLUCAGON HCL RDNA (DIAGNOSTIC) 1 MG IJ SOLR
INTRAMUSCULAR | Status: AC
  Filled 2018-12-05: qty 1

## 2018-12-05 NOTE — Progress Notes (Signed)
  Echocardiogram 2D Echocardiogram has been performed.  Catherine Williams 12/05/2018, 11:13 AM

## 2018-12-05 NOTE — Procedures (Signed)
Interventional Radiology Procedure Note  Procedure: Placement of percutaneous 20F pull-through gastrostomy tube. Complications: None Recommendations: - NPO except for sips and chips remainder of today and overnight - Maintain G-tube to LWS until tomorrow morning  - May advance diet as tolerated and begin using tube tomorrow morning  Signed,  Heath K. McCullough, MD   

## 2018-12-05 NOTE — Progress Notes (Signed)
[facility]  ROUNDING NOTE   Subjective:  Patient seen at bedside. Due for dialysis session today. Critical care recommends pulling more fluid.   Objective:  Vital signs in last 24 hours:  Temperature 90.5 pulse 67 respirations 22 blood pressure 117/49  Physical Exam: General: No acute distress  Head: Normocephalic, atraumatic. Moist oral mucosal membranes  Eyes: Anicteric  Neck: Tracheostomy in place  Lungs:  Scattered rhonchi, normal effort  Heart: S1S2 no rubs  Abdomen:  Soft, nontender, bowel sounds present  Extremities: Trace peripheral edema.  Neurologic: Awake, alert, following commands  Skin: No lesions  Access: LUE AVF, L IJ PC    Basic Metabolic Panel: Recent Labs  Lab 11/29/18 2045 11/30/18 1003 12/03/18 0443 12/05/18 0552  NA 139 138 133* 134*  K 3.6 3.5 3.2* 3.0*  CL 98 98 93* 94*  CO2 26 26 25 27   GLUCOSE 98 96 150* 110*  BUN 27* 31* 36* 28*  CREATININE 4.76* 5.47* 6.13* 5.39*  CALCIUM 9.6 9.4 9.4 9.5  PHOS  --  4.3 4.2 4.5    Liver Function Tests: Recent Labs  Lab 11/30/18 1003 12/03/18 0443 12/05/18 0552  AST  --  79*  --   ALT  --  36  --   ALKPHOS  --  548*  --   BILITOT  --  1.6*  --   PROT  --  6.7  --   ALBUMIN 1.6* 1.5* 1.6*   No results for input(s): LIPASE, AMYLASE in the last 168 hours. No results for input(s): AMMONIA in the last 168 hours.  CBC: Recent Labs  Lab 11/29/18 2045 11/30/18 1003 12/03/18 0443 12/04/18 0538 12/05/18 0552  WBC 7.4 7.6 10.9* 11.1* 11.9*  NEUTROABS  --   --  8.2*  --   --   HGB 7.3* 7.0* 7.3* 7.5* 7.0*  HCT 25.9* 25.5* 25.2* 25.9* 24.5*  MCV 93.2 93.1 90.6 90.2 90.1  PLT 312 342 358 366 411*    Cardiac Enzymes: No results for input(s): CKTOTAL, CKMB, CKMBINDEX, TROPONINI in the last 168 hours.  BNP: Invalid input(s): POCBNP  CBG: No results for input(s): GLUCAP in the last 168 hours.  Microbiology: Results for orders placed or performed during the hospital encounter  of 11/09/18  Culture, respiratory     Status: None   Collection Time: 11/29/18 12:27 PM  Result Value Ref Range Status   Specimen Description TRACHEAL ASPIRATE  Final   Special Requests NONE  Final   Gram Stain   Final    FEW WBC PRESENT, PREDOMINANTLY PMN NO ORGANISMS SEEN Performed at [facility], [address] [place], [place] [address]    Culture FEW STAPHYLOCOCCUS LUGDUNENSIS  Final   Report Status 12/02/2018 FINAL  Final   Organism ID, Bacteria STAPHYLOCOCCUS LUGDUNENSIS  Final      Susceptibility   Staphylococcus lugdunensis - MIC*    CIPROFLOXACIN 4 RESISTANT Resistant     ERYTHROMYCIN >=8 RESISTANT Resistant     GENTAMICIN <=0.5 SENSITIVE Sensitive     OXACILLIN >=4 RESISTANT Resistant     TETRACYCLINE >=16 RESISTANT Resistant     TRIMETH/SULFA 80 RESISTANT Resistant     CLINDAMYCIN >=8 RESISTANT Resistant     RIFAMPIN <=0.5 SENSITIVE Sensitive     Inducible Clindamycin NEGATIVE Sensitive     * FEW STAPHYLOCOCCUS LUGDUNENSIS  Culture, blood (routine x 2)     Status: None   Collection Time: 11/29/18  3:52 PM  Result Value Ref Range Status  Specimen Description BLOOD RIGHT THUMB  Final   Special Requests   Final    BOTTLES DRAWN AEROBIC ONLY Blood Culture adequate volume   Culture   Final    NO GROWTH 5 DAYS Performed at [facility], [address], [place], [place] [address]    Report Status 12/04/2018 FINAL  Final  Culture, blood (routine x 2)     Status: None   Collection Time: 11/29/18  3:56 PM  Result Value Ref Range Status   Specimen Description BLOOD RIGHT HAND  Final   Special Requests   Final    BOTTLES DRAWN AEROBIC ONLY Blood Culture adequate volume   Culture   Final    NO GROWTH 5 DAYS Performed at [facility], [place] [address] [place], [place] [address]    Report Status 12/04/2018 FINAL  Final    Coagulation Studies: Recent Labs    12/03/18 0443  LABPROT 13.2  INR 1.01    Urinalysis: No results for input(s):  COLORURINE, LABSPEC, PHURINE, GLUCOSEU, HGBUR, BILIRUBINUR, KETONESUR, PROTEINUR, UROBILINOGEN, NITRITE, LEUKOCYTESUR in the last 72 hours.  Invalid input(s): APPERANCEUR    Imaging: No results found.   Medications:       Assessment/ Plan:  57 y.o. female with a PMHx of recurrent V. fib/PEA arrest, recent acute respiratory failure, COPD, obstructive sleep apnea, diastolic heart failure, multifactorial anemia, diabetes mellitus type 2, who was admitted to [facility] on 11/01/2018 for ongoing care.    1.  Acute renal failure/chronic kidney disease stage IV.  Patient dialysis dependent since late October.   -Patient is due for dialysis today.  Critical care request that we pull a bit more fluid.  Increase ultrafiltration target to 2 kg today.  2.  Acute respiratory failure.  Pains on ventilator support.  FiO2 currently 28%.  3.  Anemia chronic kidney disease.     Hemoglobin down to 7.0.  Consider blood transfusion today.  However defer this to hospitalist.  4.  Secondary hyperparathyroidism.  Phosphorus 4.5 and at target.  Continue to monitor.   LOS: 0 Gerlean Cid 12/18/201911:40 AM

## 2018-12-05 NOTE — Progress Notes (Signed)
Pulmonary Critical Care Medicine [place]   PULMONARY CRITICAL CARE SERVICE  PROGRESS NOTE  Date of Service: 12/05/2018  Catherine Williams  [id number]  DOB: [birth year]   DOA: 11/09/2018  Referring Physician: Merton Border, MD  HPI: Catherine Williams is a 57 y.o. female seen for follow up of Acute on Chronic Respiratory Failure.  Patient has not been tolerating weaning she remains on assist control mode currently on 28% oxygen  Medications: Reviewed on Rounds  Physical Exam:  Vitals: Temperature 98.5 pulse 67 respiratory 22 blood pressure 117/49 saturations 100%  Ventilator Settings patient is currently on assist control FiO2 28% PEEP of 5  . General: Comfortable at this time . Eyes: Grossly normal lids, irises & conjunctiva . ENT: grossly tongue is normal . Neck: no obvious mass . Cardiovascular: S1 S2 normal no gallop . Respiratory: Coarse breath sounds with a few rhonchi . Abdomen: soft . Skin: no rash seen on limited exam . Musculoskeletal: not rigid . Psychiatric:unable to assess . Neurologic: no seizure no involuntary movements         Lab Data:   Basic Metabolic Panel: Recent Labs  Lab 11/29/18 2045 11/30/18 1003 12/03/18 0443 12/05/18 0552  NA 139 138 133* 134*  K 3.6 3.5 3.2* 3.0*  CL 98 98 93* 94*  CO2 26 26 25 27   GLUCOSE 98 96 150* 110*  BUN 27* 31* 36* 28*  CREATININE 4.76* 5.47* 6.13* 5.39*  CALCIUM 9.6 9.4 9.4 9.5  PHOS  --  4.3 4.2 4.5    ABG: Recent Labs  Lab 11/29/18 0749 11/29/18 1246  PHART 7.295* 7.326*  PCO2ART 58.6* 55.5*  PO2ART 164* 163*  HCO3 27.3 27.6  O2SAT 99.4 99.6    Liver Function Tests: Recent Labs  Lab 11/30/18 1003 12/03/18 0443 12/05/18 0552  AST  --  79*  --   ALT  --  36  --   ALKPHOS  --  548*  --   BILITOT  --  1.6*  --   PROT  --  6.7  --   ALBUMIN 1.6* 1.5* 1.6*   No results for input(s): LIPASE, AMYLASE in the last 168 hours. No results for input(s): AMMONIA in the last 168  hours.  CBC: Recent Labs  Lab 11/29/18 2045 11/30/18 1003 12/03/18 0443 12/04/18 0538 12/05/18 0552  WBC 7.4 7.6 10.9* 11.1* 11.9*  NEUTROABS  --   --  8.2*  --   --   HGB 7.3* 7.0* 7.3* 7.5* 7.0*  HCT 25.9* 25.5* 25.2* 25.9* 24.5*  MCV 93.2 93.1 90.6 90.2 90.1  PLT 312 342 358 366 411*    Cardiac Enzymes: No results for input(s): CKTOTAL, CKMB, CKMBINDEX, TROPONINI in the last 168 hours.  BNP (last 3 results) Recent Labs    05/13/18 0333  BNP 78.9    ProBNP (last 3 results) No results for input(s): PROBNP in the last 8760 hours.  Radiological Exams: No results found.  Assessment/Plan Principal Problem:   Acute on chronic respiratory failure with hypoxia (HCC) Active Problems:   Acute on chronic diastolic CHF (congestive heart failure) (HCC)   CKD (chronic kidney disease) stage 5, GFR less than 15 ml/min (HCC)   Cardiac arrest (HCC)   End stage renal disease on dialysis ([place])   Healthcare-associated pneumonia   1. Acute on chronic respiratory failure with hypoxia we will continue with the full vent support continue pulmonary toilet secretion management.  Patient is going to be dialyzed level extra with  removal of fluid however will need to monitor her pressures very closely. 2. Chronic kidney disease end-stage on dialysis 3. Cardiac arrest rhythm is stable 4. Healthcare associated pneumonia treated 5. Acute on chronic diastolic heart failure we will continue with supportive care echo had been done however it is poor quality   I have personally seen and evaluated the patient, evaluated laboratory and imaging results, formulated the assessment and plan and placed orders.  Time 35 minutes review of the chart radiological studies and discussion with the treatment team The Patient requires high complexity decision making for assessment and support.  Case was discussed on Rounds with the Respiratory Therapy Staff  Allyne Gee, MD [facility] Pulmonary Critical Care  Medicine Sleep Medicine

## 2018-12-06 LAB — HEPATITIS B SURFACE ANTIGEN: HEP B S AG: NEGATIVE

## 2018-12-06 NOTE — Progress Notes (Addendum)
Pulmonary Critical Care Medicine [place]   PULMONARY CRITICAL CARE SERVICE  PROGRESS NOTE  Date of Service: 12/06/2018  [name]  UXN:[id number]  DOB: Feb 11, 1961   DOA: 11/09/2018  Referring Physician: Merton Border, MD  HPI: Catherine Williams is a 57 y.o. female seen for follow up of Acute on Chronic Respiratory Failure.  Patient has been on pressure support 12/5 for 24 hours.  RT reports small scant secretions.  Will progress to trach collar tomorrow.  Pulse 75 respirations 18 blood pressure 117/68 O2 sat 100% temp 98.1  Medications: Reviewed on Rounds  Physical Exam:  Vitals: Pulse 75 respirations 18 blood pressure 119/68 O2 sat 100% temp 98.1  Ventilator Settings ventilator mode pressure support assist control 12/5 FiO2 28%  . General: Comfortable at this time . Eyes: Grossly normal lids, irises & conjunctiva . ENT: grossly tongue is normal . Neck: no obvious mass . Cardiovascular: S1 S2 normal no gallop . Respiratory: Coarse breath sounds . Abdomen: soft . Skin: no rash seen on limited exam . Musculoskeletal: not rigid . Psychiatric:unable to assess . Neurologic: no seizure no involuntary movements         Lab Data:   Basic Metabolic Panel: Recent Labs  Lab 11/29/18 2045 11/30/18 1003 12/03/18 0443 12/05/18 0552  NA 139 138 133* 134*  K 3.6 3.5 3.2* 3.0*  CL 98 98 93* 94*  CO2 26 26 25 27   GLUCOSE 98 96 150* 110*  BUN 27* 31* 36* 28*  CREATININE 4.76* 5.47* 6.13* 5.39*  CALCIUM 9.6 9.4 9.4 9.5  PHOS  --  4.3 4.2 4.5    ABG: No results for input(s): PHART, PCO2ART, PO2ART, HCO3, O2SAT in the last 168 hours.  Liver Function Tests: Recent Labs  Lab 11/30/18 1003 12/03/18 0443 12/05/18 0552  AST  --  79*  --   ALT  --  36  --   ALKPHOS  --  548*  --   BILITOT  --  1.6*  --   PROT  --  6.7  --   ALBUMIN 1.6* 1.5* 1.6*   No results for input(s): LIPASE, AMYLASE in the last 168 hours. No results for input(s): AMMONIA in  the last 168 hours.  CBC: Recent Labs  Lab 11/29/18 2045 11/30/18 1003 12/03/18 0443 12/04/18 0538 12/05/18 0552  WBC 7.4 7.6 10.9* 11.1* 11.9*  NEUTROABS  --   --  8.2*  --   --   HGB 7.3* 7.0* 7.3* 7.5* 7.0*  HCT 25.9* 25.5* 25.2* 25.9* 24.5*  MCV 93.2 93.1 90.6 90.2 90.1  PLT 312 342 358 366 411*    Cardiac Enzymes: No results for input(s): CKTOTAL, CKMB, CKMBINDEX, TROPONINI in the last 168 hours.  BNP (last 3 results) Recent Labs    05/13/18 0333  BNP 78.9    ProBNP (last 3 results) No results for input(s): PROBNP in the last 8760 hours.  Radiological Exams: Ir Gastrostomy Tube Mod Sed  Result Date: 12/05/2018 INDICATION: 57 year old female with chronic respiratory failure and ventilator dependence as well as dysphagia and protein calorie malnutrition. She presents for placement of a percutaneous gastrostomy tube. EXAM: Fluoroscopically guided placement of percutaneous pull-through gastrostomy tube Interventional Radiologist:  Criselda Peaches, MD MEDICATIONS: 1 g Ancef, 1 mg glucagon; Antibiotics were administered within 1 hour of the procedure. ANESTHESIA/SEDATION: Versed 1.5 mg IV; Fentanyl 75 mcg IV Moderate Sedation Time:  36 minutes The patient was continuously monitored during the procedure by the interventional radiology nurse  under my direct supervision. CONTRAST:  15 mL Isovue-300 FLUOROSCOPY TIME:  Fluoroscopy Time: 7 minutes 18 seconds (190 mGy). COMPLICATIONS: None immediate. PROCEDURE: Informed written consent was obtained from the patient after a thorough discussion of the procedural risks, benefits and alternatives. All questions were addressed. Maximal Sterile Barrier Technique was utilized including caps, mask, sterile gowns, sterile gloves, sterile drape, hand hygiene and skin antiseptic. A timeout was performed prior to the initiation of the procedure. Maximal barrier sterile technique utilized including caps, mask, sterile gowns, sterile gloves, large  sterile drape, hand hygiene, and chlorhexadine skin prep. An angled catheter was advanced over a wire under fluoroscopic guidance through the nose, down the esophagus and into the body of the stomach. The stomach was then insufflated with several 100 ml of air. Fluoroscopy confirmed location of the gastric bubble, as well as inferior displacement of the barium stained colon. Under direct fluoroscopic guidance, a single T-tack was placed, and the anterior gastric wall drawn up against the anterior abdominal wall. Percutaneous access was then obtained into the mid gastric body with an 18 gauge sheath needle. Aspiration of air, and injection of contrast material under fluoroscopy confirmed needle placement. An Amplatz wire was advanced in the gastric body and the access needle exchanged for a 9-French vascular sheath. A snare device was advanced through the vascular sheath and an Amplatz wire advanced through the angled catheter. The Amplatz wire was successfully snared and this was pulled up through the esophagus and out the mouth. A 20-French Alinda Dooms MIC-PEG tube was then connected to the snare and pulled through the mouth, down the esophagus, into the stomach and out to the anterior abdominal wall. Hand injection of contrast material confirmed intragastric location. The T-tack retention suture was then cut. The pull through peg tube was then secured with the external bumper and capped. The patient will be observed for several hours with the newly placed tube on low wall suction to evaluate for any post procedure complication. The patient tolerated the procedure well, there is no immediate complication. IMPRESSION: Successful placement of a 20 French pull through gastrostomy tube. Electronically Signed   By: Jacqulynn Cadet M.D.   On: 12/05/2018 15:38    Assessment/Plan Principal Problem:   Acute on chronic respiratory failure with hypoxia (HCC) Active Problems:   Acute on chronic diastolic CHF  (congestive heart failure) (HCC)   CKD (chronic kidney disease) stage 5, GFR less than 15 ml/min (HCC)   Cardiac arrest (HCC)   End stage renal disease on dialysis ([place])   Healthcare-associated pneumonia   1. Acute on chronic respiratory failure with hypoxia continue pressure support and progress to trach collar per protocol.  Continue pulmonary toilet and secretion management. 2. Chronic kidney disease end-stage on dialysis 3. Cardiac arrest rhythm stable 4. Healthcare associated pneumonia treated 5. Acute on chronic diastolic heart failure continue supportive care   I have personally seen and evaluated the patient, evaluated laboratory and imaging results, formulated the assessment and plan and placed orders. The Patient requires high complexity decision making for assessment and support.  Case was discussed on Rounds with the Respiratory Therapy Staff  Allyne Gee, MD [facility] Pulmonary Critical Care Medicine Sleep Medicine

## 2018-12-07 LAB — RENAL FUNCTION PANEL
ANION GAP: 14 (ref 5–15)
Albumin: 1.6 g/dL — ABNORMAL LOW (ref 3.5–5.0)
BUN: 25 mg/dL — ABNORMAL HIGH (ref 6–20)
CO2: 23 mmol/L (ref 22–32)
Calcium: 9.3 mg/dL (ref 8.9–10.3)
Chloride: 97 mmol/L — ABNORMAL LOW (ref 98–111)
Creatinine, Ser: 4.83 mg/dL — ABNORMAL HIGH (ref 0.44–1.00)
GFR calc non Af Amer: 9 mL/min — ABNORMAL LOW (ref >60)
GFR, EST AFRICAN AMERICAN: 11 mL/min — AB (ref >60)
GLUCOSE: 105 mg/dL — AB (ref 70–99)
Phosphorus: 5.1 mg/dL — ABNORMAL HIGH (ref 2.5–4.6)
Potassium: 3.3 mmol/L — ABNORMAL LOW (ref 3.5–5.1)
Sodium: 134 mmol/L — ABNORMAL LOW (ref 135–145)

## 2018-12-07 LAB — CBC
HCT: 23.7 % — ABNORMAL LOW (ref 36.0–46.0)
Hemoglobin: 7 g/dL — ABNORMAL LOW (ref 12.0–15.0)
MCH: 27.3 pg (ref 26.0–34.0)
MCHC: 29.5 g/dL — AB (ref 30.0–36.0)
MCV: 92.6 fL (ref 80.0–100.0)
Platelets: 396 10*3/uL (ref 150–400)
RBC: 2.56 MIL/uL — AB (ref 3.87–5.11)
RDW: 17.2 % — ABNORMAL HIGH (ref 11.5–15.5)
WBC: 10.3 10*3/uL (ref 4.0–10.5)
nRBC: 0.7 % — ABNORMAL HIGH (ref 0.0–0.2)

## 2018-12-07 LAB — VANCOMYCIN, TROUGH: Vancomycin Tr: 13 ug/mL — ABNORMAL LOW (ref 15–20)

## 2018-12-07 NOTE — Progress Notes (Signed)
[facility]  ROUNDING NOTE   Subjective:  Patient seen at bedside. Due for dialysis again later today.    Objective:  Vital signs in last 24 hours:  Temperature 97.2 pulse 81 respirations 15 blood pressure 150/72  Physical Exam: General: No acute distress  Head: Normocephalic, atraumatic. Moist oral mucosal membranes  Eyes: Anicteric  Neck: Tracheostomy in place  Lungs:  Scattered rhonchi, normal effort  Heart: S1S2 no rubs  Abdomen:  Soft, nontender, bowel sounds present  Extremities: Trace peripheral edema.  Neurologic: Awake, alert, following commands  Skin: No lesions  Access: LUE AVF, L IJ PC    Basic Metabolic Panel: Recent Labs  Lab 11/30/18 1003 12/03/18 0443 12/05/18 0552 12/07/18 0603  NA 138 133* 134* 134*  K 3.5 3.2* 3.0* 3.3*  CL 98 93* 94* 97*  CO2 26 25 27 23   GLUCOSE 96 150* 110* 105*  BUN 31* 36* 28* 25*  CREATININE 5.47* 6.13* 5.39* 4.83*  CALCIUM 9.4 9.4 9.5 9.3  PHOS 4.3 4.2 4.5 5.1*    Liver Function Tests: Recent Labs  Lab 11/30/18 1003 12/03/18 0443 12/05/18 0552 12/07/18 0603  AST  --  79*  --   --   ALT  --  36  --   --   ALKPHOS  --  548*  --   --   BILITOT  --  1.6*  --   --   PROT  --  6.7  --   --   ALBUMIN 1.6* 1.5* 1.6* 1.6*   No results for input(s): LIPASE, AMYLASE in the last 168 hours. No results for input(s): AMMONIA in the last 168 hours.  CBC: Recent Labs  Lab 11/30/18 1003 12/03/18 0443 12/04/18 0538 12/05/18 0552 12/07/18 0603  WBC 7.6 10.9* 11.1* 11.9* 10.3  NEUTROABS  --  8.2*  --   --   --   HGB 7.0* 7.3* 7.5* 7.0* 7.0*  HCT 25.5* 25.2* 25.9* 24.5* 23.7*  MCV 93.1 90.6 90.2 90.1 92.6  PLT 342 358 366 411* 396    Cardiac Enzymes: No results for input(s): CKTOTAL, CKMB, CKMBINDEX, TROPONINI in the last 168 hours.  BNP: Invalid input(s): POCBNP  CBG: No results for input(s): GLUCAP in the last 168 hours.  Microbiology: Results for orders placed or performed during the hospital  encounter of 11/09/18  Culture, respiratory     Status: None   Collection Time: 11/29/18 12:27 PM  Result Value Ref Range Status   Specimen Description TRACHEAL ASPIRATE  Final   Special Requests NONE  Final   Gram Stain   Final    FEW WBC PRESENT, PREDOMINANTLY PMN NO ORGANISMS SEEN Performed at [facility], [address] [place], [place] [address]    Culture FEW STAPHYLOCOCCUS LUGDUNENSIS  Final   Report Status 12/02/2018 FINAL  Final   Organism ID, Bacteria STAPHYLOCOCCUS LUGDUNENSIS  Final      Susceptibility   Staphylococcus lugdunensis - MIC*    CIPROFLOXACIN 4 RESISTANT Resistant     ERYTHROMYCIN >=8 RESISTANT Resistant     GENTAMICIN <=0.5 SENSITIVE Sensitive     OXACILLIN >=4 RESISTANT Resistant     TETRACYCLINE >=16 RESISTANT Resistant     TRIMETH/SULFA 80 RESISTANT Resistant     CLINDAMYCIN >=8 RESISTANT Resistant     RIFAMPIN <=0.5 SENSITIVE Sensitive     Inducible Clindamycin NEGATIVE Sensitive     * FEW STAPHYLOCOCCUS LUGDUNENSIS  Culture, blood (routine x 2)     Status: None  Collection Time: 11/29/18  3:52 PM  Result Value Ref Range Status   Specimen Description BLOOD RIGHT THUMB  Final   Special Requests   Final    BOTTLES DRAWN AEROBIC ONLY Blood Culture adequate volume   Culture   Final    NO GROWTH 5 DAYS Performed at [facility], [address] [place], [place] [address]    Report Status 12/04/2018 FINAL  Final  Culture, blood (routine x 2)     Status: None   Collection Time: 11/29/18  3:56 PM  Result Value Ref Range Status   Specimen Description BLOOD RIGHT HAND  Final   Special Requests   Final    BOTTLES DRAWN AEROBIC ONLY Blood Culture adequate volume   Culture   Final    NO GROWTH 5 DAYS Performed at [facility], [place] [address] [place],  [address]    Report Status 12/04/2018 FINAL  Final    Coagulation Studies: No results for input(s): LABPROT, INR in the last 72 hours.  Urinalysis: No results for  input(s): COLORURINE, LABSPEC, PHURINE, GLUCOSEU, HGBUR, BILIRUBINUR, KETONESUR, PROTEINUR, UROBILINOGEN, NITRITE, LEUKOCYTESUR in the last 72 hours.  Invalid input(s): APPERANCEUR    Imaging: Ir Gastrostomy Tube Mod Sed  Result Date: 12/05/2018 INDICATION: 57 year old female with chronic respiratory failure and ventilator dependence as well as dysphagia and protein calorie malnutrition. She presents for placement of a percutaneous gastrostomy tube. EXAM: Fluoroscopically guided placement of percutaneous pull-through gastrostomy tube Interventional Radiologist:  Criselda Peaches, MD MEDICATIONS: 1 g Ancef, 1 mg glucagon; Antibiotics were administered within 1 hour of the procedure. ANESTHESIA/SEDATION: Versed 1.5 mg IV; Fentanyl 75 mcg IV Moderate Sedation Time:  36 minutes The patient was continuously monitored during the procedure by the interventional radiology nurse under my direct supervision. CONTRAST:  15 mL Isovue-300 FLUOROSCOPY TIME:  Fluoroscopy Time: 7 minutes 18 seconds (190 mGy). COMPLICATIONS: None immediate. PROCEDURE: Informed written consent was obtained from the patient after a thorough discussion of the procedural risks, benefits and alternatives. All questions were addressed. Maximal Sterile Barrier Technique was utilized including caps, mask, sterile gowns, sterile gloves, sterile drape, hand hygiene and skin antiseptic. A timeout was performed prior to the initiation of the procedure. Maximal barrier sterile technique utilized including caps, mask, sterile gowns, sterile gloves, large sterile drape, hand hygiene, and chlorhexadine skin prep. An angled catheter was advanced over a wire under fluoroscopic guidance through the nose, down the esophagus and into the body of the stomach. The stomach was then insufflated with several 100 ml of air. Fluoroscopy confirmed location of the gastric bubble, as well as inferior displacement of the barium stained colon. Under direct fluoroscopic  guidance, a single T-tack was placed, and the anterior gastric wall drawn up against the anterior abdominal wall. Percutaneous access was then obtained into the mid gastric body with an 18 gauge sheath needle. Aspiration of air, and injection of contrast material under fluoroscopy confirmed needle placement. An Amplatz wire was advanced in the gastric body and the access needle exchanged for a 9-French vascular sheath. A snare device was advanced through the vascular sheath and an Amplatz wire advanced through the angled catheter. The Amplatz wire was successfully snared and this was pulled up through the esophagus and out the mouth. A 20-French Alinda Dooms MIC-PEG tube was then connected to the snare and pulled through the mouth, down the esophagus, into the stomach and out to the anterior abdominal wall. Hand injection of contrast material confirmed intragastric location.  The T-tack retention suture was then cut. The pull through peg tube was then secured with the external bumper and capped. The patient will be observed for several hours with the newly placed tube on low wall suction to evaluate for any post procedure complication. The patient tolerated the procedure well, there is no immediate complication. IMPRESSION: Successful placement of a 20 French pull through gastrostomy tube. Electronically Signed   By: Jacqulynn Cadet M.D.   On: 12/05/2018 15:38     Medications:       Assessment/ Plan:  57 y.o. female with a PMHx of recurrent V. fib/PEA arrest, recent acute respiratory failure, COPD, obstructive sleep apnea, diastolic heart failure, multifactorial anemia, diabetes mellitus type 2, who was admitted to [facility] on 11/01/2018 for ongoing care.    1.  Acute renal failure/chronic kidney disease stage IV.  Patient dialysis dependent since late October.   -Patient due for dialysis today.  Orders have been prepared.  2.  Acute respiratory failure.  Continue current ventilatory support.   Remains on the ventilator at this time.  3.  Anemia chronic kidney disease.     Hemoglobin remains low at 7.  Consider blood transfusion but as before defer to hospitalist.  4.  Secondary hyperparathyroidism.  Phosphorus 5.1 and acceptable.  Continue to periodically monitor.   LOS: 0 Jovany Disano 12/20/20198:36 AM

## 2018-12-07 NOTE — Progress Notes (Signed)
Pulmonary Critical Care Medicine [place]   PULMONARY CRITICAL CARE SERVICE  PROGRESS NOTE  Date of Service: 12/07/2018  HULDAH Williams  KQA:[id number]  DOB: 1961/03/09   DOA: 11/09/2018  Referring Physician: Merton Border, MD  HPI: Catherine Williams is a 57 y.o. female seen for follow up of Acute on Chronic Respiratory Failure.  Patient is on T collar right now has been on 28% oxygen today she is tolerating it fairly well patient is also being dialyzed  Medications: Reviewed on Rounds  Physical Exam:  Vitals: Temperature 97.2 pulse 81 respiratory 15 blood pressure 110/72 saturations 98%  Ventilator Settings currently on T collar FiO2 28%  . General: Comfortable at this time . Eyes: Grossly normal lids, irises & conjunctiva . ENT: grossly tongue is normal . Neck: no obvious mass . Cardiovascular: S1 S2 normal no gallop . Respiratory: No rhonchi or rales are noted at this time . Abdomen: soft . Skin: no rash seen on limited exam . Musculoskeletal: not rigid . Psychiatric:unable to assess . Neurologic: no seizure no involuntary movements         Lab Data:   Basic Metabolic Panel: Recent Labs  Lab 12/03/18 0443 12/05/18 0552 12/07/18 0603  NA 133* 134* 134*  K 3.2* 3.0* 3.3*  CL 93* 94* 97*  CO2 25 27 23   GLUCOSE 150* 110* 105*  BUN 36* 28* 25*  CREATININE 6.13* 5.39* 4.83*  CALCIUM 9.4 9.5 9.3  PHOS 4.2 4.5 5.1*    ABG: No results for input(s): PHART, PCO2ART, PO2ART, HCO3, O2SAT in the last 168 hours.  Liver Function Tests: Recent Labs  Lab 12/03/18 0443 12/05/18 0552 12/07/18 0603  AST 79*  --   --   ALT 36  --   --   ALKPHOS 548*  --   --   BILITOT 1.6*  --   --   PROT 6.7  --   --   ALBUMIN 1.5* 1.6* 1.6*   No results for input(s): LIPASE, AMYLASE in the last 168 hours. No results for input(s): AMMONIA in the last 168 hours.  CBC: Recent Labs  Lab 12/03/18 0443 12/04/18 0538 12/05/18 0552 12/07/18 0603  WBC 10.9*  11.1* 11.9* 10.3  NEUTROABS 8.2*  --   --   --   HGB 7.3* 7.5* 7.0* 7.0*  HCT 25.2* 25.9* 24.5* 23.7*  MCV 90.6 90.2 90.1 92.6  PLT 358 366 411* 396    Cardiac Enzymes: No results for input(s): CKTOTAL, CKMB, CKMBINDEX, TROPONINI in the last 168 hours.  BNP (last 3 results) Recent Labs    05/13/18 0333  BNP 78.9    ProBNP (last 3 results) No results for input(s): PROBNP in the last 8760 hours.  Radiological Exams: Ir Gastrostomy Tube Mod Sed  Result Date: 12/05/2018 INDICATION: 57 year old female with chronic respiratory failure and ventilator dependence as well as dysphagia and protein calorie malnutrition. She presents for placement of a percutaneous gastrostomy tube. EXAM: Fluoroscopically guided placement of percutaneous pull-through gastrostomy tube Interventional Radiologist:  Criselda Peaches, MD MEDICATIONS: 1 g Ancef, 1 mg glucagon; Antibiotics were administered within 1 hour of the procedure. ANESTHESIA/SEDATION: Versed 1.5 mg IV; Fentanyl 75 mcg IV Moderate Sedation Time:  36 minutes The patient was continuously monitored during the procedure by the interventional radiology nurse under my direct supervision. CONTRAST:  15 mL Isovue-300 FLUOROSCOPY TIME:  Fluoroscopy Time: 7 minutes 18 seconds (190 mGy). COMPLICATIONS: None immediate. PROCEDURE: Informed written consent was obtained from the patient after  a thorough discussion of the procedural risks, benefits and alternatives. All questions were addressed. Maximal Sterile Barrier Technique was utilized including caps, mask, sterile gowns, sterile gloves, sterile drape, hand hygiene and skin antiseptic. A timeout was performed prior to the initiation of the procedure. Maximal barrier sterile technique utilized including caps, mask, sterile gowns, sterile gloves, large sterile drape, hand hygiene, and chlorhexadine skin prep. An angled catheter was advanced over a wire under fluoroscopic guidance through the nose, down the  esophagus and into the body of the stomach. The stomach was then insufflated with several 100 ml of air. Fluoroscopy confirmed location of the gastric bubble, as well as inferior displacement of the barium stained colon. Under direct fluoroscopic guidance, a single T-tack was placed, and the anterior gastric wall drawn up against the anterior abdominal wall. Percutaneous access was then obtained into the mid gastric body with an 18 gauge sheath needle. Aspiration of air, and injection of contrast material under fluoroscopy confirmed needle placement. An Amplatz wire was advanced in the gastric body and the access needle exchanged for a 9-French vascular sheath. A snare device was advanced through the vascular sheath and an Amplatz wire advanced through the angled catheter. The Amplatz wire was successfully snared and this was pulled up through the esophagus and out the mouth. A 20-French Alinda Dooms MIC-PEG tube was then connected to the snare and pulled through the mouth, down the esophagus, into the stomach and out to the anterior abdominal wall. Hand injection of contrast material confirmed intragastric location. The T-tack retention suture was then cut. The pull through peg tube was then secured with the external bumper and capped. The patient will be observed for several hours with the newly placed tube on low wall suction to evaluate for any post procedure complication. The patient tolerated the procedure well, there is no immediate complication. IMPRESSION: Successful placement of a 20 French pull through gastrostomy tube. Electronically Signed   By: Jacqulynn Cadet M.D.   On: 12/05/2018 15:38    Assessment/Plan Principal Problem:   Acute on chronic respiratory failure with hypoxia (HCC) Active Problems:   Acute on chronic diastolic CHF (congestive heart failure) (HCC)   CKD (chronic kidney disease) stage 5, GFR less than 15 ml/min (HCC)   Cardiac arrest (HCC)   End stage renal disease on  dialysis ([place])   Healthcare-associated pneumonia   1. Acute on chronic respiratory failure with hypoxia patient is on 28% FiO2 good saturations are noted.  Secretions are fair to moderate. 2. Acute on chronic diastolic heart failure improving continue with fluid removal through dialysis 3. Chronic kidney disease end-stage on dialysis 4. Healthcare associated pneumonia treated improved 5. Cardiac arrest rhythm is been stable we will continue to monitor   I have personally seen and evaluated the patient, evaluated laboratory and imaging results, formulated the assessment and plan and placed orders. The Patient requires high complexity decision making for assessment and support.  Case was discussed on Rounds with the Respiratory Therapy Staff  Allyne Gee, MD [facility] Pulmonary Critical Care Medicine Sleep Medicine

## 2018-12-08 NOTE — Progress Notes (Addendum)
Pulmonary Critical Care Medicine [place]   PULMONARY CRITICAL CARE SERVICE  PROGRESS NOTE  Date of Service: 12/08/2018  Catherine Williams  EZM:[id number]  DOB: 03-03-1961   DOA: 11/09/2018  Referring Physician: Merton Border, MD  HPI: Catherine Williams is a 57 y.o. female seen for follow up of Acute on Chronic Respiratory Failure.  Patient is doing well she managed 12 hours on 28% trach collar yesterday.  Her goal today is 16 hours and at this point she appears to be doing well.  Minimal secretions noted.  Medications: Reviewed on Rounds  Physical Exam:  Vitals: Pulse 68 respiration 17 blood pressure 130/70 O2 sat 99% temp 98.1  Ventilator Settings patient is not currently on ventilator  . General: Comfortable at this time . Eyes: Grossly normal lids, irises & conjunctiva . ENT: grossly tongue is normal . Neck: no obvious mass . Cardiovascular: S1 S2 normal no gallop . Respiratory: No wheezes or rhonchi noted . Abdomen: soft . Skin: no rash seen on limited exam . Musculoskeletal: not rigid . Psychiatric:unable to assess . Neurologic: no seizure no involuntary movements         Lab Data:   Basic Metabolic Panel: Recent Labs  Lab 12/03/18 0443 12/05/18 0552 12/07/18 0603  NA 133* 134* 134*  K 3.2* 3.0* 3.3*  CL 93* 94* 97*  CO2 25 27 23   GLUCOSE 150* 110* 105*  BUN 36* 28* 25*  CREATININE 6.13* 5.39* 4.83*  CALCIUM 9.4 9.5 9.3  PHOS 4.2 4.5 5.1*    ABG: No results for input(s): PHART, PCO2ART, PO2ART, HCO3, O2SAT in the last 168 hours.  Liver Function Tests: Recent Labs  Lab 12/03/18 0443 12/05/18 0552 12/07/18 0603  AST 79*  --   --   ALT 36  --   --   ALKPHOS 548*  --   --   BILITOT 1.6*  --   --   PROT 6.7  --   --   ALBUMIN 1.5* 1.6* 1.6*   No results for input(s): LIPASE, AMYLASE in the last 168 hours. No results for input(s): AMMONIA in the last 168 hours.  CBC: Recent Labs  Lab 12/03/18 0443 12/04/18 0538  12/05/18 0552 12/07/18 0603  WBC 10.9* 11.1* 11.9* 10.3  NEUTROABS 8.2*  --   --   --   HGB 7.3* 7.5* 7.0* 7.0*  HCT 25.2* 25.9* 24.5* 23.7*  MCV 90.6 90.2 90.1 92.6  PLT 358 366 411* 396    Cardiac Enzymes: No results for input(s): CKTOTAL, CKMB, CKMBINDEX, TROPONINI in the last 168 hours.  BNP (last 3 results) Recent Labs    05/13/18 0333  BNP 78.9    ProBNP (last 3 results) No results for input(s): PROBNP in the last 8760 hours.  Radiological Exams: No results found.  Assessment/Plan Principal Problem:   Acute on chronic respiratory failure with hypoxia (HCC) Active Problems:   Acute on chronic diastolic CHF (congestive heart failure) (HCC)   CKD (chronic kidney disease) stage 5, GFR less than 15 ml/min (HCC)   Cardiac arrest (HCC)   End stage renal disease on dialysis ([place])   Healthcare-associated pneumonia   1. Acute on chronic respiratory failure with hypoxia patient continues to do well on 28% with good saturations.  Minimal secretions at this time. 2. Acute on chronic diastolic heart failure improving continue dialysis for fluid removal 3. Chronic kidney disease end-stage on dialysis 4. Healthcare associated pneumonia treated improved 5. Cardiac arrest rhythm stable, continue to monitor  I have personally seen and evaluated the patient, evaluated laboratory and imaging results, formulated the assessment and plan and placed orders. The Patient requires high complexity decision making for assessment and support.  Case was discussed on Rounds with the Respiratory Therapy Staff  Allyne Gee, MD [facility] Pulmonary Critical Care Medicine Sleep Medicine

## 2018-12-09 LAB — RENAL FUNCTION PANEL
Albumin: 1.6 g/dL — ABNORMAL LOW (ref 3.5–5.0)
Anion gap: 14 (ref 5–15)
BUN: 23 mg/dL — ABNORMAL HIGH (ref 6–20)
CO2: 26 mmol/L (ref 22–32)
CREATININE: 4.83 mg/dL — AB (ref 0.44–1.00)
Calcium: 9.5 mg/dL (ref 8.9–10.3)
Chloride: 95 mmol/L — ABNORMAL LOW (ref 98–111)
GFR calc Af Amer: 11 mL/min — ABNORMAL LOW (ref >60)
GFR calc non Af Amer: 9 mL/min — ABNORMAL LOW (ref >60)
Glucose, Bld: 111 mg/dL — ABNORMAL HIGH (ref 70–99)
Phosphorus: 4.6 mg/dL (ref 2.5–4.6)
Potassium: 3.4 mmol/L — ABNORMAL LOW (ref 3.5–5.1)
Sodium: 135 mmol/L (ref 135–145)

## 2018-12-09 LAB — CBC
HCT: 25 % — ABNORMAL LOW (ref 36.0–46.0)
Hemoglobin: 7.4 g/dL — ABNORMAL LOW (ref 12.0–15.0)
MCH: 27.4 pg (ref 26.0–34.0)
MCHC: 29.6 g/dL — ABNORMAL LOW (ref 30.0–36.0)
MCV: 92.6 fL (ref 80.0–100.0)
Platelets: 453 10*3/uL — ABNORMAL HIGH (ref 150–400)
RBC: 2.7 MIL/uL — AB (ref 3.87–5.11)
RDW: 17.3 % — ABNORMAL HIGH (ref 11.5–15.5)
WBC: 12.1 10*3/uL — ABNORMAL HIGH (ref 4.0–10.5)
nRBC: 0.7 % — ABNORMAL HIGH (ref 0.0–0.2)

## 2018-12-09 NOTE — Progress Notes (Addendum)
Pulmonary Critical Care Medicine [place]   PULMONARY CRITICAL CARE SERVICE  PROGRESS NOTE  Date of Service: 12/09/2018  HOLLYNN GARNO  ZSW:[id number]  DOB: [birth year]   DOA: 11/09/2018  Referring Physician: Merton Border, MD  HPI: EDINA WINNINGHAM is a 57 y.o. female seen for follow up of Acute on Chronic Respiratory Failure.  She was able to do 16 hours on 28% trach collar yesterday.  Her goal for today will be 20 hours.  She is currently doing well.  Minimal secretions noted at this time.  Medications: Reviewed on Rounds  Physical Exam:  Vitals: Pulse 63 respirations 16 blood pressure 124/49 O2 sat 100% temp 98.8  Ventilator Settings patient's not currently on ventilator  . General: Comfortable at this time . Eyes: Grossly normal lids, irises & conjunctiva . ENT: grossly tongue is normal . Neck: no obvious mass . Cardiovascular: S1 S2 normal no gallop . Respiratory: No wheezes or rhonchi noted . Abdomen: soft . Skin: no rash seen on limited exam . Musculoskeletal: not rigid . Psychiatric:unable to assess . Neurologic: no seizure no involuntary movements         Lab Data:   Basic Metabolic Panel: Recent Labs  Lab 12/03/18 0443 12/05/18 0552 12/07/18 0603 12/09/18 0650  NA 133* 134* 134* 135  K 3.2* 3.0* 3.3* 3.4*  CL 93* 94* 97* 95*  CO2 25 27 23 26   GLUCOSE 150* 110* 105* 111*  BUN 36* 28* 25* 23*  CREATININE 6.13* 5.39* 4.83* 4.83*  CALCIUM 9.4 9.5 9.3 9.5  PHOS 4.2 4.5 5.1* 4.6    ABG: No results for input(s): PHART, PCO2ART, PO2ART, HCO3, O2SAT in the last 168 hours.  Liver Function Tests: Recent Labs  Lab 12/03/18 0443 12/05/18 0552 12/07/18 0603 12/09/18 0650  AST 79*  --   --   --   ALT 36  --   --   --   ALKPHOS 548*  --   --   --   BILITOT 1.6*  --   --   --   PROT 6.7  --   --   --   ALBUMIN 1.5* 1.6* 1.6* 1.6*   No results for input(s): LIPASE, AMYLASE in the last 168 hours. No results for input(s): AMMONIA in  the last 168 hours.  CBC: Recent Labs  Lab 12/03/18 0443 12/04/18 0538 12/05/18 0552 12/07/18 0603 12/09/18 0650  WBC 10.9* 11.1* 11.9* 10.3 12.1*  NEUTROABS 8.2*  --   --   --   --   HGB 7.3* 7.5* 7.0* 7.0* 7.4*  HCT 25.2* 25.9* 24.5* 23.7* 25.0*  MCV 90.6 90.2 90.1 92.6 92.6  PLT 358 366 411* 396 453*    Cardiac Enzymes: No results for input(s): CKTOTAL, CKMB, CKMBINDEX, TROPONINI in the last 168 hours.  BNP (last 3 results) Recent Labs    05/13/18 0333  BNP 78.9    ProBNP (last 3 results) No results for input(s): PROBNP in the last 8760 hours.  Radiological Exams: No results found.  Assessment/Plan Principal Problem:   Acute on chronic respiratory failure with hypoxia (HCC) Active Problems:   Acute on chronic diastolic CHF (congestive heart failure) (HCC)   CKD (chronic kidney disease) stage 5, GFR less than 15 ml/min (HCC)   Cardiac arrest (HCC)   End stage renal disease on dialysis ([place])   Healthcare-associated pneumonia   1. Acute on chronic respiratory failure with hypoxia patient is doing well on 28% trach collar with good saturations.  There are minimal secretions and patient's goal was 20 hours today. 2. Acute on chronic diastolic heart failure improving continue dialysis for fluid removal 3. Chronic kidney disease end-stage on dialysis 4. Healthcare associated pneumonia treated improved 5. Cardiac arrest rhythm stable continue to monitor   I have personally seen and evaluated the patient, evaluated laboratory and imaging results, formulated the assessment and plan and placed orders. The Patient requires high complexity decision making for assessment and support.  Case was discussed on Rounds with the Respiratory Therapy Staff  Allyne Gee, MD [facility] Pulmonary Critical Care Medicine Sleep Medicine

## 2018-12-10 NOTE — Progress Notes (Signed)
[facility]  ROUNDING NOTE   Subjective:  Patient had hemodialysis yesterday. will be due for dialysis again tomorrow. Resting comfortably in bed at the moment.    Objective:  Vital signs in last 24 hours:  Temperature 98.1 pulse 81 respirations 18 blood pressure 130/53  Physical Exam: General: No acute distress  Head: Normocephalic, atraumatic. Moist oral mucosal membranes  Eyes: Anicteric  Neck: Tracheostomy in place  Lungs:  Scattered rhonchi, normal effort  Heart: S1S2 no rubs  Abdomen:  Soft, nontender, bowel sounds present  Extremities: Trace peripheral edema.  Neurologic: Awake, alert, following commands  Skin: No lesions  Access: LUE AVF, L IJ PC    Basic Metabolic Panel: Recent Labs  Lab 12/05/18 0552 12/07/18 0603 12/09/18 0650  NA 134* 134* 135  K 3.0* 3.3* 3.4*  CL 94* 97* 95*  CO2 27 23 26   GLUCOSE 110* 105* 111*  BUN 28* 25* 23*  CREATININE 5.39* 4.83* 4.83*  CALCIUM 9.5 9.3 9.5  PHOS 4.5 5.1* 4.6    Liver Function Tests: Recent Labs  Lab 12/05/18 0552 12/07/18 0603 12/09/18 0650  ALBUMIN 1.6* 1.6* 1.6*   No results for input(s): LIPASE, AMYLASE in the last 168 hours. No results for input(s): AMMONIA in the last 168 hours.  CBC: Recent Labs  Lab 12/04/18 0538 12/05/18 0552 12/07/18 0603 12/09/18 0650  WBC 11.1* 11.9* 10.3 12.1*  HGB 7.5* 7.0* 7.0* 7.4*  HCT 25.9* 24.5* 23.7* 25.0*  MCV 90.2 90.1 92.6 92.6  PLT 366 411* 396 453*    Cardiac Enzymes: No results for input(s): CKTOTAL, CKMB, CKMBINDEX, TROPONINI in the last 168 hours.  BNP: Invalid input(s): POCBNP  CBG: No results for input(s): GLUCAP in the last 168 hours.  Microbiology: Results for orders placed or performed during the hospital encounter of 11/09/18  Culture, respiratory     Status: None   Collection Time: 11/29/18 12:27 PM  Result Value Ref Range Status   Specimen Description TRACHEAL ASPIRATE  Final   Special Requests NONE  Final   Gram  Stain   Final    FEW WBC PRESENT, PREDOMINANTLY PMN NO ORGANISMS SEEN Performed at [facility], [address], [place], [place] [address]    Culture FEW STAPHYLOCOCCUS LUGDUNENSIS  Final   Report Status 12/02/2018 FINAL  Final   Organism ID, Bacteria STAPHYLOCOCCUS LUGDUNENSIS  Final      Susceptibility   Staphylococcus lugdunensis - MIC*    CIPROFLOXACIN 4 RESISTANT Resistant     ERYTHROMYCIN >=8 RESISTANT Resistant     GENTAMICIN <=0.5 SENSITIVE Sensitive     OXACILLIN >=4 RESISTANT Resistant     TETRACYCLINE >=16 RESISTANT Resistant     TRIMETH/SULFA 80 RESISTANT Resistant     CLINDAMYCIN >=8 RESISTANT Resistant     RIFAMPIN <=0.5 SENSITIVE Sensitive     Inducible Clindamycin NEGATIVE Sensitive     * FEW STAPHYLOCOCCUS LUGDUNENSIS  Culture, blood (routine x 2)     Status: None   Collection Time: 11/29/18  3:52 PM  Result Value Ref Range Status   Specimen Description BLOOD RIGHT THUMB  Final   Special Requests   Final    BOTTLES DRAWN AEROBIC ONLY Blood Culture adequate volume   Culture   Final    NO GROWTH 5 DAYS Performed at [facility], [address] [place], [place] [address]    Report Status 12/04/2018 FINAL  Final  Culture, blood (routine x 2)     Status: None  Collection Time: 11/29/18  3:56 PM  Result Value Ref Range Status   Specimen Description BLOOD RIGHT HAND  Final   Special Requests   Final    BOTTLES DRAWN AEROBIC ONLY Blood Culture adequate volume   Culture   Final    NO GROWTH 5 DAYS Performed at [facility], [address] [place], [place] [address]    Report Status 12/04/2018 FINAL  Final    Coagulation Studies: No results for input(s): LABPROT, INR in the last 72 hours.  Urinalysis: No results for input(s): COLORURINE, LABSPEC, PHURINE, GLUCOSEU, HGBUR, BILIRUBINUR, KETONESUR, PROTEINUR, UROBILINOGEN, NITRITE, LEUKOCYTESUR in the last 72 hours.  Invalid input(s): APPERANCEUR    Imaging: No results  found.   Medications:       Assessment/ Plan:  57 y.o. female with a PMHx of recurrent V. fib/PEA arrest, recent acute respiratory failure, COPD, obstructive sleep apnea, diastolic heart failure, multifactorial anemia, diabetes mellitus type 2, who was admitted to [facility] on 11/01/2018 for ongoing care.    1.  Acute renal failure/chronic kidney disease stage IV.  Patient dialysis dependent since late October.   -Patient had hemodialysis yesterday.  No acute indication for dialysis today.  We will plan for dialysis again tomorrow.  2.  Acute respiratory failure.  Trach collar being used for part of the day.  Eating comfortably at the moment.  3.  Anemia chronic kidney disease.     Hemoglobin currently 7.4 and improved a bit.  Continue to monitor CBC closely.  Consider blood transfusion for hemoglobin of 7 or less.  4.  Secondary hyperparathyroidism.  Phosphorus currently at target at 4.6.  Continue to monitor.   LOS: 0 Juddson Cobern 12/23/20199:25 AM

## 2018-12-10 NOTE — Progress Notes (Signed)
Pulmonary Critical Care Medicine [place]   PULMONARY CRITICAL CARE SERVICE  PROGRESS NOTE  Date of Service: 12/10/2018  SETAREH ROM  YNW:[id number]  DOB: [birth year]   DOA: 11/09/2018  Referring Physician: Merton Border, MD  HPI: Catherine Williams is a 57 y.o. female seen for follow up of Acute on Chronic Respiratory Failure.  Currently is comfortable remains on T collar has been off the ventilator for more than 48 hours  Medications: Reviewed on Rounds  Physical Exam:  Vitals: Temperature 98.4 pulse 81 respiratory 18 blood pressure 130/55 saturations 99%  Ventilator Settings currently off the ventilator on T collar  . General: Comfortable at this time . Eyes: Grossly normal lids, irises & conjunctiva . ENT: grossly tongue is normal . Neck: no obvious mass . Cardiovascular: S1 S2 normal no gallop . Respiratory: No rhonchi no rales are noted . Abdomen: soft . Skin: no rash seen on limited exam . Musculoskeletal: not rigid . Psychiatric:unable to assess . Neurologic: no seizure no involuntary movements         Lab Data:   Basic Metabolic Panel: Recent Labs  Lab 12/05/18 0552 12/07/18 0603 12/09/18 0650  NA 134* 134* 135  K 3.0* 3.3* 3.4*  CL 94* 97* 95*  CO2 27 23 26   GLUCOSE 110* 105* 111*  BUN 28* 25* 23*  CREATININE 5.39* 4.83* 4.83*  CALCIUM 9.5 9.3 9.5  PHOS 4.5 5.1* 4.6    ABG: No results for input(s): PHART, PCO2ART, PO2ART, HCO3, O2SAT in the last 168 hours.  Liver Function Tests: Recent Labs  Lab 12/05/18 0552 12/07/18 0603 12/09/18 0650  ALBUMIN 1.6* 1.6* 1.6*   No results for input(s): LIPASE, AMYLASE in the last 168 hours. No results for input(s): AMMONIA in the last 168 hours.  CBC: Recent Labs  Lab 12/04/18 0538 12/05/18 0552 12/07/18 0603 12/09/18 0650  WBC 11.1* 11.9* 10.3 12.1*  HGB 7.5* 7.0* 7.0* 7.4*  HCT 25.9* 24.5* 23.7* 25.0*  MCV 90.2 90.1 92.6 92.6  PLT 366 411* 396 453*    Cardiac  Enzymes: No results for input(s): CKTOTAL, CKMB, CKMBINDEX, TROPONINI in the last 168 hours.  BNP (last 3 results) Recent Labs    05/13/18 0333  BNP 78.9    ProBNP (last 3 results) No results for input(s): PROBNP in the last 8760 hours.  Radiological Exams: No results found.  Assessment/Plan Principal Problem:   Acute on chronic respiratory failure with hypoxia (HCC) Active Problems:   Acute on chronic diastolic CHF (congestive heart failure) (HCC)   CKD (chronic kidney disease) stage 5, GFR less than 15 ml/min (HCC)   Cardiac arrest (HCC)   End stage renal disease on dialysis ([place])   Healthcare-associated pneumonia   1. Acute on chronic respiratory failure with hypoxia continue the wean on T collar trials.  Continue pulmonary toilet supportive care. 2. Chronic kidney disease end-stage on dialysis continue supportive care 3. Cardiac arrest rhythm is stable 4. Healthcare associated pneumonia treated we will monitor 5. Acute on chronic diastolic heart failure monitor fluid status closely   I have personally seen and evaluated the patient, evaluated laboratory and imaging results, formulated the assessment and plan and placed orders. The Patient requires high complexity decision making for assessment and support.  Case was discussed on Rounds with the Respiratory Therapy Staff  Allyne Gee, MD [facility] Pulmonary Critical Care Medicine Sleep Medicine

## 2018-12-11 LAB — CBC
HCT: 27 % — ABNORMAL LOW (ref 36.0–46.0)
HCT: 27.5 % — ABNORMAL LOW (ref 36.0–46.0)
HEMOGLOBIN: 7.8 g/dL — AB (ref 12.0–15.0)
Hemoglobin: 7.9 g/dL — ABNORMAL LOW (ref 12.0–15.0)
MCH: 26.3 pg (ref 26.0–34.0)
MCH: 26.2 pg (ref 26.0–34.0)
MCHC: 28.9 g/dL — ABNORMAL LOW (ref 30.0–36.0)
MCHC: 28.7 g/dL — ABNORMAL LOW (ref 30.0–36.0)
MCV: 90.9 fL (ref 80.0–100.0)
MCV: 91.4 fL (ref 80.0–100.0)
PLATELETS: 371 10*3/uL (ref 150–400)
Platelets: 447 10*3/uL — ABNORMAL HIGH (ref 150–400)
RBC: 2.97 MIL/uL — ABNORMAL LOW (ref 3.87–5.11)
RBC: 3.01 MIL/uL — ABNORMAL LOW (ref 3.87–5.11)
RDW: 17.4 % — ABNORMAL HIGH (ref 11.5–15.5)
RDW: 17.6 % — ABNORMAL HIGH (ref 11.5–15.5)
WBC: 13.6 10*3/uL — ABNORMAL HIGH (ref 4.0–10.5)
WBC: 19.2 10*3/uL — AB (ref 4.0–10.5)
nRBC: 0.5 % — ABNORMAL HIGH (ref 0.0–0.2)
nRBC: 0.4 % — ABNORMAL HIGH (ref 0.0–0.2)

## 2018-12-11 LAB — RENAL FUNCTION PANEL
ALBUMIN: 2 g/dL — AB (ref 3.5–5.0)
Albumin: 2.1 g/dL — ABNORMAL LOW (ref 3.5–5.0)
Anion gap: 13 (ref 5–15)
Anion gap: 13 (ref 5–15)
BUN: 35 mg/dL — ABNORMAL HIGH (ref 6–20)
BUN: 24 mg/dL — ABNORMAL HIGH (ref 6–20)
CALCIUM: 10 mg/dL (ref 8.9–10.3)
CALCIUM: 9.5 mg/dL (ref 8.9–10.3)
CO2: 26 mmol/L (ref 22–32)
CO2: 24 mmol/L (ref 22–32)
Chloride: 94 mmol/L — ABNORMAL LOW (ref 98–111)
Chloride: 97 mmol/L — ABNORMAL LOW (ref 98–111)
Creatinine, Ser: 5.28 mg/dL — ABNORMAL HIGH (ref 0.44–1.00)
Creatinine, Ser: 3.85 mg/dL — ABNORMAL HIGH (ref 0.44–1.00)
GFR calc Af Amer: 10 mL/min — ABNORMAL LOW (ref >60)
GFR calc Af Amer: 14 mL/min — ABNORMAL LOW (ref >60)
GFR calc non Af Amer: 8 mL/min — ABNORMAL LOW (ref >60)
GFR calc non Af Amer: 12 mL/min — ABNORMAL LOW (ref >60)
Glucose, Bld: 127 mg/dL — ABNORMAL HIGH (ref 70–99)
Glucose, Bld: 131 mg/dL — ABNORMAL HIGH (ref 70–99)
PHOSPHORUS: 5.1 mg/dL — AB (ref 2.5–4.6)
Phosphorus: 2.9 mg/dL (ref 2.5–4.6)
Potassium: 3.4 mmol/L — ABNORMAL LOW (ref 3.5–5.1)
Potassium: 4.2 mmol/L (ref 3.5–5.1)
SODIUM: 134 mmol/L — AB (ref 135–145)
Sodium: 133 mmol/L — ABNORMAL LOW (ref 135–145)

## 2018-12-11 NOTE — Progress Notes (Signed)
Pulmonary Critical Care Medicine [place]   PULMONARY CRITICAL CARE SERVICE  PROGRESS NOTE  Date of Service: 12/11/2018  Catherine Williams  SPQ:[id number]  DOB: [birth year]   DOA: 11/09/2018  Referring Physician: Merton Border, MD  HPI: Catherine Williams is a 57 y.o. female seen for follow up of Acute on Chronic Respiratory Failure.  Patient is doing well with T collar has been off the ventilator for more than 48 hours.  Medications: Reviewed on Rounds  Physical Exam:  Vitals: Temperature 98.3 pulse 63 respiratory rate 19 blood pressure 125/63 saturations 100%  Ventilator Settings tolerated we will change the tracheostomy and try to begin capping  . General: Comfortable at this time . Eyes: Grossly normal lids, irises & conjunctiva . ENT: grossly tongue is normal . Neck: no obvious mass . Cardiovascular: S1 S2 normal no gallop . Respiratory: No rhonchi no rales . Abdomen: soft . Skin: no rash seen on limited exam . Musculoskeletal: not rigid . Psychiatric:unable to assess . Neurologic: no seizure no involuntary movements         Lab Data:   Basic Metabolic Panel: Recent Labs  Lab 12/05/18 0552 12/07/18 0603 12/09/18 0650 12/11/18 0901  NA 134* 134* 135 133*  K 3.0* 3.3* 3.4* 3.4*  CL 94* 97* 95* 94*  CO2 27 23 26 26   GLUCOSE 110* 105* 111* 127*  BUN 28* 25* 23* 35*  CREATININE 5.39* 4.83* 4.83* 5.28*  CALCIUM 9.5 9.3 9.5 10.0  PHOS 4.5 5.1* 4.6 5.1*    ABG: No results for input(s): PHART, PCO2ART, PO2ART, HCO3, O2SAT in the last 168 hours.  Liver Function Tests: Recent Labs  Lab 12/05/18 0552 12/07/18 0603 12/09/18 0650 12/11/18 0901  ALBUMIN 1.6* 1.6* 1.6* 2.0*   No results for input(s): LIPASE, AMYLASE in the last 168 hours. No results for input(s): AMMONIA in the last 168 hours.  CBC: Recent Labs  Lab 12/05/18 0552 12/07/18 0603 12/09/18 0650 12/11/18 0900  WBC 11.9* 10.3 12.1* 13.6*  HGB 7.0* 7.0* 7.4* 7.8*  HCT 24.5*  23.7* 25.0* 27.0*  MCV 90.1 92.6 92.6 90.9  PLT 411* 396 453* 447*    Cardiac Enzymes: No results for input(s): CKTOTAL, CKMB, CKMBINDEX, TROPONINI in the last 168 hours.  BNP (last 3 results) Recent Labs    05/13/18 0333  BNP 78.9    ProBNP (last 3 results) No results for input(s): PROBNP in the last 8760 hours.  Radiological Exams: No results found.  Assessment/Plan Principal Problem:   Acute on chronic respiratory failure with hypoxia (HCC) Active Problems:   Acute on chronic diastolic CHF (congestive heart failure) (HCC)   CKD (chronic kidney disease) stage 5, GFR less than 15 ml/min (HCC)   Cardiac arrest (HCC)   End stage renal disease on dialysis ([place])   Healthcare-associated pneumonia   1. Acute on chronic respiratory failure with hypoxia continue with the T collar advance to capping 2. Chronic kidney disease end-stage on dialysis 3. Healthcare associated pneumonia treated we will continue to monitor 4. Acute on chronic congestive heart failure we will continue to monitor fluid status   I have personally seen and evaluated the patient, evaluated laboratory and imaging results, formulated the assessment and plan and placed orders. The Patient requires high complexity decision making for assessment and support.  Case was discussed on Rounds with the Respiratory Therapy Staff  Allyne Gee, MD [facility] Pulmonary Critical Care Medicine Sleep Medicine

## 2018-12-12 NOTE — Progress Notes (Signed)
[facility]  ROUNDING NOTE   Subjective:  Patient resting comfortably in bed at the moment. She will be due for dialysis again on Friday. Completed dialysis yesterday.  Objective:  Vital signs in last 24 hours:  Temperature 98.3 pulse 77 respirations 24 blood pressure 103/43  Physical Exam: General: No acute distress  Head: Normocephalic, atraumatic. Moist oral mucosal membranes  Eyes: Anicteric  Neck: Tracheostomy in place  Lungs:  Scattered rhonchi, normal effort  Heart: S1S2 no rubs  Abdomen:  Soft, nontender, bowel sounds present  Extremities: Trace peripheral edema.  Neurologic: Awake, alert, following commands  Skin: No lesions  Access: LUE AVF, L IJ PC    Basic Metabolic Panel: Recent Labs  Lab 12/07/18 0603 12/09/18 0650 12/11/18 0901 12/11/18 1936  NA 134* 135 133* 134*  K 3.3* 3.4* 3.4* 4.2  CL 97* 95* 94* 97*  CO2 23 26 26 24   GLUCOSE 105* 111* 127* 131*  BUN 25* 23* 35* 24*  CREATININE 4.83* 4.83* 5.28* 3.85*  CALCIUM 9.3 9.5 10.0 9.5  PHOS 5.1* 4.6 5.1* 2.9    Liver Function Tests: Recent Labs  Lab 12/07/18 0603 12/09/18 0650 12/11/18 0901 12/11/18 1936  ALBUMIN 1.6* 1.6* 2.0* 2.1*   No results for input(s): LIPASE, AMYLASE in the last 168 hours. No results for input(s): AMMONIA in the last 168 hours.  CBC: Recent Labs  Lab 12/07/18 0603 12/09/18 0650 12/11/18 0900 12/11/18 1936  WBC 10.3 12.1* 13.6* 19.2*  HGB 7.0* 7.4* 7.8* 7.9*  HCT 23.7* 25.0* 27.0* 27.5*  MCV 92.6 92.6 90.9 91.4  PLT 396 453* 447* 371    Cardiac Enzymes: No results for input(s): CKTOTAL, CKMB, CKMBINDEX, TROPONINI in the last 168 hours.  BNP: Invalid input(s): POCBNP  CBG: No results for input(s): GLUCAP in the last 168 hours.  Microbiology: Results for orders placed or performed during the hospital encounter of 11/09/18  Culture, respiratory     Status: None   Collection Time: 11/29/18 12:27 PM  Result Value Ref Range Status   Specimen Description TRACHEAL ASPIRATE  Final   Special Requests NONE  Final   Gram Stain   Final    FEW WBC PRESENT, PREDOMINANTLY PMN NO ORGANISMS SEEN Performed at [facility], [address], [place], [place] [address]    Culture FEW STAPHYLOCOCCUS LUGDUNENSIS  Final   Report Status 12/02/2018 FINAL  Final   Organism ID, Bacteria STAPHYLOCOCCUS LUGDUNENSIS  Final      Susceptibility   Staphylococcus lugdunensis - MIC*    CIPROFLOXACIN 4 RESISTANT Resistant     ERYTHROMYCIN >=8 RESISTANT Resistant     GENTAMICIN <=0.5 SENSITIVE Sensitive     OXACILLIN >=4 RESISTANT Resistant     TETRACYCLINE >=16 RESISTANT Resistant     TRIMETH/SULFA 80 RESISTANT Resistant     CLINDAMYCIN >=8 RESISTANT Resistant     RIFAMPIN <=0.5 SENSITIVE Sensitive     Inducible Clindamycin NEGATIVE Sensitive     * FEW STAPHYLOCOCCUS LUGDUNENSIS  Culture, blood (routine x 2)     Status: None   Collection Time: 11/29/18  3:52 PM  Result Value Ref Range Status   Specimen Description BLOOD RIGHT THUMB  Final   Special Requests   Final    BOTTLES DRAWN AEROBIC ONLY Blood Culture adequate volume   Culture   Final    NO GROWTH 5 DAYS Performed at [facility], [address] [place], [place] [address]    Report Status 12/04/2018 FINAL  Final  Culture, blood (routine x 2)     Status: None   Collection Time: 11/29/18  3:56 PM  Result Value Ref Range Status   Specimen Description BLOOD RIGHT HAND  Final   Special Requests   Final    BOTTLES DRAWN AEROBIC ONLY Blood Culture adequate volume   Culture   Final    NO GROWTH 5 DAYS Performed at [facility], [address] [place], [place] [address]    Report Status 12/04/2018 FINAL  Final    Coagulation Studies: No results for input(s): LABPROT, INR in the last 72 hours.  Urinalysis: No results for input(s): COLORURINE, LABSPEC, PHURINE, GLUCOSEU, HGBUR, BILIRUBINUR, KETONESUR, PROTEINUR, UROBILINOGEN, NITRITE, LEUKOCYTESUR in the last 72  hours.  Invalid input(s): APPERANCEUR    Imaging: No results found.   Medications:       Assessment/ Plan:  57 y.o. female with a PMHx of recurrent V. fib/PEA arrest, recent acute respiratory failure, COPD, obstructive sleep apnea, diastolic heart failure, multifactorial anemia, diabetes mellitus type 2, who was admitted to [facility] on 11/01/2018 for ongoing care.    1.  Acute renal failure/chronic kidney disease stage IV.  Patient dialysis dependent since late October.   -Patient due for dialysis again on Friday.  Had dialysis yesterday.  2.  Acute respiratory failure.  Patient on trach collar this a.m.  Tolerating well.  3.  Anemia chronic kidney disease.     Hemoglobin up a bit to 7.9.  Continue to monitor CBC.  4.  Secondary hyperparathyroidism.  Phosphorus did come down to 2.9 during the treatment.  Repeat phosphorus on Friday.   LOS: 0 Catherine Williams 12/25/201911:21 AM

## 2018-12-12 NOTE — Progress Notes (Signed)
Pulmonary Critical Care Medicine [place]   PULMONARY CRITICAL CARE SERVICE  PROGRESS NOTE  Date of Service: 12/12/2018  INDIRA SORENSON  TKZ:[id number]  DOB: January 05, 1961   DOA: 11/09/2018  Referring Physician: Merton Border, MD  HPI: RAESHAWN TAFOLLA is a 57 y.o. female seen for follow up of Acute on Chronic Respiratory Failure.  Patient is on T collar has a #5 XLT trach in place she is tolerating it well.  Medications: Reviewed on Rounds  Physical Exam:  Vitals: Temperature 97.9 pulse 104 respiratory 24 blood pressure 102/43 saturations 99%  Ventilator Settings currently is on T collar FiO2 28%  . General: Comfortable at this time . Eyes: Grossly normal lids, irises & conjunctiva . ENT: grossly tongue is normal . Neck: no obvious mass . Cardiovascular: S1 S2 normal no gallop . Respiratory: No rhonchi no rales are noted at this time . Abdomen: soft . Skin: no rash seen on limited exam . Musculoskeletal: not rigid . Psychiatric:unable to assess . Neurologic: no seizure no involuntary movements         Lab Data:   Basic Metabolic Panel: Recent Labs  Lab 12/07/18 0603 12/09/18 0650 12/11/18 0901 12/11/18 1936  NA 134* 135 133* 134*  K 3.3* 3.4* 3.4* 4.2  CL 97* 95* 94* 97*  CO2 23 26 26 24   GLUCOSE 105* 111* 127* 131*  BUN 25* 23* 35* 24*  CREATININE 4.83* 4.83* 5.28* 3.85*  CALCIUM 9.3 9.5 10.0 9.5  PHOS 5.1* 4.6 5.1* 2.9    ABG: No results for input(s): PHART, PCO2ART, PO2ART, HCO3, O2SAT in the last 168 hours.  Liver Function Tests: Recent Labs  Lab 12/07/18 0603 12/09/18 0650 12/11/18 0901 12/11/18 1936  ALBUMIN 1.6* 1.6* 2.0* 2.1*   No results for input(s): LIPASE, AMYLASE in the last 168 hours. No results for input(s): AMMONIA in the last 168 hours.  CBC: Recent Labs  Lab 12/07/18 0603 12/09/18 0650 12/11/18 0900 12/11/18 1936  WBC 10.3 12.1* 13.6* 19.2*  HGB 7.0* 7.4* 7.8* 7.9*  HCT 23.7* 25.0* 27.0* 27.5*  MCV  92.6 92.6 90.9 91.4  PLT 396 453* 447* 371    Cardiac Enzymes: No results for input(s): CKTOTAL, CKMB, CKMBINDEX, TROPONINI in the last 168 hours.  BNP (last 3 results) Recent Labs    05/13/18 0333  BNP 78.9    ProBNP (last 3 results) No results for input(s): PROBNP in the last 8760 hours.  Radiological Exams: No results found.  Assessment/Plan Principal Problem:   Acute on chronic respiratory failure with hypoxia (HCC) Active Problems:   Acute on chronic diastolic CHF (congestive heart failure) (HCC)   CKD (chronic kidney disease) stage 5, GFR less than 15 ml/min (HCC)   Cardiac arrest (HCC)   End stage renal disease on dialysis ([place])   Healthcare-associated pneumonia   1. Acute on chronic respiratory failure with hypoxia we will continue with T collar I have asked respiratory therapy to assess for potential of capping continue secretion management. 2. Chronic kidney disease end-stage on dialysis continue with nephrology recommendations 3. Cardiac arrest rhythm is stable 4. Healthcare associated pneumonia treated 5. Acute on chronic diastolic heart failure follow fluid status   I have personally seen and evaluated the patient, evaluated laboratory and imaging results, formulated the assessment and plan and placed orders. The Patient requires high complexity decision making for assessment and support.  Case was discussed on Rounds with the Respiratory Therapy Staff  Allyne Gee, MD [facility] Pulmonary Critical Care  Medicine Sleep Medicine

## 2018-12-13 NOTE — Progress Notes (Signed)
Pulmonary Critical Care Medicine [place]   PULMONARY CRITICAL CARE SERVICE  PROGRESS NOTE  Date of Service: 12/13/2018  Catherine Williams  [id number]  DOB: [birth year]   DOA: 11/09/2018  Referring Physician: Merton Border, MD  HPI: Catherine Williams is a 57 y.o. female seen for follow up of Acute on Chronic Respiratory Failure.  She is awake and alert comfortable tolerating PMV fairly well.  Medications: Reviewed on Rounds  Physical Exam:  Vitals: Temperature 98.0 pulse 73 respiratory 22 blood pressure 126/60 saturations 94%  Ventilator Settings currently on T collar FiO2 28% with the PMV in place  . General: Comfortable at this time . Eyes: Grossly normal lids, irises & conjunctiva . ENT: grossly tongue is normal . Neck: no obvious mass . Cardiovascular: S1 S2 normal no gallop . Respiratory: No rhonchi no rales are noted at this time . Abdomen: soft . Skin: no rash seen on limited exam . Musculoskeletal: not rigid . Psychiatric:unable to assess . Neurologic: no seizure no involuntary movements         Lab Data:   Basic Metabolic Panel: Recent Labs  Lab 12/07/18 0603 12/09/18 0650 12/11/18 0901 12/11/18 1936  NA 134* 135 133* 134*  K 3.3* 3.4* 3.4* 4.2  CL 97* 95* 94* 97*  CO2 23 26 26 24   GLUCOSE 105* 111* 127* 131*  BUN 25* 23* 35* 24*  CREATININE 4.83* 4.83* 5.28* 3.85*  CALCIUM 9.3 9.5 10.0 9.5  PHOS 5.1* 4.6 5.1* 2.9    ABG: No results for input(s): PHART, PCO2ART, PO2ART, HCO3, O2SAT in the last 168 hours.  Liver Function Tests: Recent Labs  Lab 12/07/18 0603 12/09/18 0650 12/11/18 0901 12/11/18 1936  ALBUMIN 1.6* 1.6* 2.0* 2.1*   No results for input(s): LIPASE, AMYLASE in the last 168 hours. No results for input(s): AMMONIA in the last 168 hours.  CBC: Recent Labs  Lab 12/07/18 0603 12/09/18 0650 12/11/18 0900 12/11/18 1936  WBC 10.3 12.1* 13.6* 19.2*  HGB 7.0* 7.4* 7.8* 7.9*  HCT 23.7* 25.0* 27.0* 27.5*  MCV  92.6 92.6 90.9 91.4  PLT 396 453* 447* 371    Cardiac Enzymes: No results for input(s): CKTOTAL, CKMB, CKMBINDEX, TROPONINI in the last 168 hours.  BNP (last 3 results) Recent Labs    05/13/18 0333  BNP 78.9    ProBNP (last 3 results) No results for input(s): PROBNP in the last 8760 hours.  Radiological Exams: No results found.  Assessment/Plan Principal Problem:   Acute on chronic respiratory failure with hypoxia (HCC) Active Problems:   Acute on chronic diastolic CHF (congestive heart failure) (HCC)   CKD (chronic kidney disease) stage 5, GFR less than 15 ml/min (HCC)   Cardiac arrest (HCC)   End stage renal disease on dialysis ([place])   Healthcare-associated pneumonia   1. Acute on chronic respiratory failure with hypoxia we will continue with the T collar trials continue pulmonary toilet supportive care I want to advance her to capping 2. Chronic kidney disease end-stage on dialysis followed by nephrology. 3. Acute on chronic diastolic heart failure need to continue to withdraw fluid during dialysis to keep her euvolemic 4. End-stage renal disease on hemodialysis 5. Healthcare associated pneumonia treated   I have personally seen and evaluated the patient, evaluated laboratory and imaging results, formulated the assessment and plan and placed orders. The Patient requires high complexity decision making for assessment and support.  Case was discussed on Rounds with the Respiratory Therapy Staff  Allyne Gee,  MD [facility] Pulmonary Critical Care Medicine Sleep Medicine

## 2018-12-14 LAB — RENAL FUNCTION PANEL
ALBUMIN: 2.4 g/dL — AB (ref 3.5–5.0)
Anion gap: 16 — ABNORMAL HIGH (ref 5–15)
BUN: 48 mg/dL — ABNORMAL HIGH (ref 6–20)
CO2: 23 mmol/L (ref 22–32)
Calcium: 10 mg/dL (ref 8.9–10.3)
Chloride: 94 mmol/L — ABNORMAL LOW (ref 98–111)
Creatinine, Ser: 6.42 mg/dL — ABNORMAL HIGH (ref 0.44–1.00)
GFR calc Af Amer: 8 mL/min — ABNORMAL LOW (ref >60)
GFR calc non Af Amer: 7 mL/min — ABNORMAL LOW (ref >60)
Glucose, Bld: 132 mg/dL — ABNORMAL HIGH (ref 70–99)
POTASSIUM: 4.3 mmol/L (ref 3.5–5.1)
Phosphorus: 5.8 mg/dL — ABNORMAL HIGH (ref 2.5–4.6)
Sodium: 133 mmol/L — ABNORMAL LOW (ref 135–145)

## 2018-12-14 LAB — CBC
HEMATOCRIT: 29.4 % — AB (ref 36.0–46.0)
Hemoglobin: 8.6 g/dL — ABNORMAL LOW (ref 12.0–15.0)
MCH: 27.3 pg (ref 26.0–34.0)
MCHC: 29.3 g/dL — AB (ref 30.0–36.0)
MCV: 93.3 fL (ref 80.0–100.0)
Platelets: 413 10*3/uL — ABNORMAL HIGH (ref 150–400)
RBC: 3.15 MIL/uL — ABNORMAL LOW (ref 3.87–5.11)
RDW: 18.5 % — ABNORMAL HIGH (ref 11.5–15.5)
WBC: 14.6 10*3/uL — ABNORMAL HIGH (ref 4.0–10.5)
nRBC: 0.2 % (ref 0.0–0.2)

## 2018-12-14 NOTE — Progress Notes (Signed)
Pulmonary Critical Care Medicine [place]   PULMONARY CRITICAL CARE SERVICE  PROGRESS NOTE  Date of Service: 12/14/2018  Catherine Williams  EZM:[id number]  DOB: [birth year]   DOA: 11/09/2018  Referring Physician: Merton Border, MD  HPI: Catherine Williams is a 57 y.o. female seen for follow up of Acute on Chronic Respiratory Failure.  Currently is on T collar she is doing well.  Has been requiring 28% oxygen good saturations are noted  Medications: Reviewed on Rounds  Physical Exam:  Vitals: Temperature 97.4 pulse 81 respiratory 15 blood pressure 141/54 saturations 98%  Ventilator Settings off the ventilator on T collar at this time  . General: Comfortable at this time . Eyes: Grossly normal lids, irises & conjunctiva . ENT: grossly tongue is normal . Neck: no obvious mass . Cardiovascular: S1 S2 normal no gallop . Respiratory: No rhonchi or rales are noted . Abdomen: soft . Skin: no rash seen on limited exam . Musculoskeletal: not rigid . Psychiatric:unable to assess . Neurologic: no seizure no involuntary movements         Lab Data:   Basic Metabolic Panel: Recent Labs  Lab 12/09/18 0650 12/11/18 0901 12/11/18 1936 12/14/18 0658  NA 135 133* 134* 133*  K 3.4* 3.4* 4.2 4.3  CL 95* 94* 97* 94*  CO2 26 26 24 23   GLUCOSE 111* 127* 131* 132*  BUN 23* 35* 24* 48*  CREATININE 4.83* 5.28* 3.85* 6.42*  CALCIUM 9.5 10.0 9.5 10.0  PHOS 4.6 5.1* 2.9 5.8*    ABG: No results for input(s): PHART, PCO2ART, PO2ART, HCO3, O2SAT in the last 168 hours.  Liver Function Tests: Recent Labs  Lab 12/09/18 0650 12/11/18 0901 12/11/18 1936 12/14/18 0658  ALBUMIN 1.6* 2.0* 2.1* 2.4*   No results for input(s): LIPASE, AMYLASE in the last 168 hours. No results for input(s): AMMONIA in the last 168 hours.  CBC: Recent Labs  Lab 12/09/18 0650 12/11/18 0900 12/11/18 1936 12/14/18 0658  WBC 12.1* 13.6* 19.2* 14.6*  HGB 7.4* 7.8* 7.9* 8.6*  HCT 25.0* 27.0*  27.5* 29.4*  MCV 92.6 90.9 91.4 93.3  PLT 453* 447* 371 413*    Cardiac Enzymes: No results for input(s): CKTOTAL, CKMB, CKMBINDEX, TROPONINI in the last 168 hours.  BNP (last 3 results) Recent Labs    05/13/18 0333  BNP 78.9    ProBNP (last 3 results) No results for input(s): PROBNP in the last 8760 hours.  Radiological Exams: No results found.  Assessment/Plan Principal Problem:   Acute on chronic respiratory failure with hypoxia (HCC) Active Problems:   Acute on chronic diastolic CHF (congestive heart failure) (HCC)   CKD (chronic kidney disease) stage 5, GFR less than 15 ml/min (HCC)   Cardiac arrest (HCC)   End stage renal disease on dialysis ([place])   Healthcare-associated pneumonia   1. Acute on chronic respiratory failure with hypoxia we will continue with T collar trials continue secretion management pulmonary toilet.  We should be able to hopefully start capping. 2. Acute on chronic systolic heart failure patient is doing better with fluid removal 3. Chronic kidney disease end-stage on dialysis 4. Cardiac arrest rhythm is stable 5. Healthcare associated pneumonia clinically improved   I have personally seen and evaluated the patient, evaluated laboratory and imaging results, formulated the assessment and plan and placed orders. The Patient requires high complexity decision making for assessment and support.  Case was discussed on Rounds with the Respiratory Therapy Staff  Allyne Gee, MD [facility]  Pulmonary Critical Care Medicine Sleep Medicine

## 2018-12-14 NOTE — Progress Notes (Signed)
[facility]  ROUNDING NOTE   Subjective:  Patient seen at bedside. Seen and evaluated during dialysis. Blood pressure has dropped a bit.  Objective:  Vital signs in last 24 hours:  Temperature 97.4 pulse 81 respirations 15 predialysis blood pressure 141/54  Physical Exam: General: No acute distress  Head: Normocephalic, atraumatic. Moist oral mucosal membranes  Eyes: Anicteric  Neck: Tracheostomy in place  Lungs:  Scattered rhonchi, normal effort  Heart: S1S2 no rubs  Abdomen:  Soft, nontender, bowel sounds present  Extremities: Trace peripheral edema.  Neurologic: Awake, alert, following commands  Skin: No lesions  Access: LUE AVF, L IJ PC    Basic Metabolic Panel: Recent Labs  Lab 12/09/18 0650 12/11/18 0901 12/11/18 1936 12/14/18 0658  NA 135 133* 134* 133*  K 3.4* 3.4* 4.2 4.3  CL 95* 94* 97* 94*  CO2 26 26 24 23   GLUCOSE 111* 127* 131* 132*  BUN 23* 35* 24* 48*  CREATININE 4.83* 5.28* 3.85* 6.42*  CALCIUM 9.5 10.0 9.5 10.0  PHOS 4.6 5.1* 2.9 5.8*    Liver Function Tests: Recent Labs  Lab 12/09/18 0650 12/11/18 0901 12/11/18 1936 12/14/18 0658  ALBUMIN 1.6* 2.0* 2.1* 2.4*   No results for input(s): LIPASE, AMYLASE in the last 168 hours. No results for input(s): AMMONIA in the last 168 hours.  CBC: Recent Labs  Lab 12/09/18 0650 12/11/18 0900 12/11/18 1936 12/14/18 0658  WBC 12.1* 13.6* 19.2* 14.6*  HGB 7.4* 7.8* 7.9* 8.6*  HCT 25.0* 27.0* 27.5* 29.4*  MCV 92.6 90.9 91.4 93.3  PLT 453* 447* 371 413*    Cardiac Enzymes: No results for input(s): CKTOTAL, CKMB, CKMBINDEX, TROPONINI in the last 168 hours.  BNP: Invalid input(s): POCBNP  CBG: No results for input(s): GLUCAP in the last 168 hours.  Microbiology: Results for orders placed or performed during the hospital encounter of 11/09/18  Culture, respiratory     Status: None   Collection Time: 11/29/18 12:27 PM  Result Value Ref Range Status   Specimen Description  TRACHEAL ASPIRATE  Final   Special Requests NONE  Final   Gram Stain   Final    FEW WBC PRESENT, PREDOMINANTLY PMN NO ORGANISMS SEEN Performed at [facility], [address] [place], [place] [address]    Culture FEW STAPHYLOCOCCUS LUGDUNENSIS  Final   Report Status 12/02/2018 FINAL  Final   Organism ID, Bacteria STAPHYLOCOCCUS LUGDUNENSIS  Final      Susceptibility   Staphylococcus lugdunensis - MIC*    CIPROFLOXACIN 4 RESISTANT Resistant     ERYTHROMYCIN >=8 RESISTANT Resistant     GENTAMICIN <=0.5 SENSITIVE Sensitive     OXACILLIN >=4 RESISTANT Resistant     TETRACYCLINE >=16 RESISTANT Resistant     TRIMETH/SULFA 80 RESISTANT Resistant     CLINDAMYCIN >=8 RESISTANT Resistant     RIFAMPIN <=0.5 SENSITIVE Sensitive     Inducible Clindamycin NEGATIVE Sensitive     * FEW STAPHYLOCOCCUS LUGDUNENSIS  Culture, blood (routine x 2)     Status: None   Collection Time: 11/29/18  3:52 PM  Result Value Ref Range Status   Specimen Description BLOOD RIGHT THUMB  Final   Special Requests   Final    BOTTLES DRAWN AEROBIC ONLY Blood Culture adequate volume   Culture   Final    NO GROWTH 5 DAYS Performed at [facility], [address], [place],  [address]    Report Status 12/04/2018 FINAL  Final  Culture, blood (routine  x 2)     Status: None   Collection Time: 11/29/18  3:56 PM  Result Value Ref Range Status   Specimen Description BLOOD RIGHT HAND  Final   Special Requests   Final    BOTTLES DRAWN AEROBIC ONLY Blood Culture adequate volume   Culture   Final    NO GROWTH 5 DAYS Performed at [facility], [address], [place], [place] [address]    Report Status 12/04/2018 FINAL  Final    Coagulation Studies: No results for input(s): LABPROT, INR in the last 72 hours.  Urinalysis: No results for input(s): COLORURINE, LABSPEC, PHURINE, GLUCOSEU, HGBUR, BILIRUBINUR, KETONESUR, PROTEINUR, UROBILINOGEN, NITRITE, LEUKOCYTESUR in the last 72 hours.  Invalid  input(s): APPERANCEUR    Imaging: No results found.   Medications:       Assessment/ Plan:  57 y.o. female with a PMHx of recurrent V. fib/PEA arrest, recent acute respiratory failure, COPD, obstructive sleep apnea, diastolic heart failure, multifactorial anemia, diabetes mellitus type 2, who was admitted to [facility] on 11/01/2018 for ongoing care.    1.  Acute renal failure/chronic kidney disease stage IV.  Patient dialysis dependent since late October.   -Patient seen during dialysis.  Ultrafiltration currently on hold as blood pressure is dropped a bit.  Continue to monitor her progress closely during dialysis treatment.  2.  Acute respiratory failure.  Continue current tracheostomy care.  3.  Anemia chronic kidney disease.     Hemoglobin up to 8.6.  Recheck CBC on Monday.  4.  Secondary hyperparathyroidism.  Phosphorus currently 5.8.  Recheck on Monday.   LOS: 0 Catherine Williams 12/27/20199:20 AM

## 2018-12-15 NOTE — Progress Notes (Signed)
Pulmonary Critical Care Medicine [place]   PULMONARY CRITICAL CARE SERVICE  PROGRESS NOTE  Date of Service: 12/15/2018  [name]  YBO:[id number]  DOB: 07/21/1961   DOA: 11/09/2018  Referring Physician: Merton Border, MD  HPI: Catherine Williams is a 57 y.o. female seen for follow up of Acute on Chronic Respiratory Failure.  She is on T collar was also able to use the PMV  Medications: Reviewed on Rounds  Physical Exam:  Vitals: Temperature 98.6 pulse 88 respiratory rate 28 blood pressure 139/57 saturations 94%  Ventilator Settings off the ventilator on T collar right now  . General: Comfortable at this time . Eyes: Grossly normal lids, irises & conjunctiva . ENT: grossly tongue is normal . Neck: no obvious mass . Cardiovascular: S1 S2 normal no gallop . Respiratory: No rhonchi or rales are noted at this time . Abdomen: soft . Skin: no rash seen on limited exam . Musculoskeletal: not rigid . Psychiatric:unable to assess . Neurologic: no seizure no involuntary movements         Lab Data:   Basic Metabolic Panel: Recent Labs  Lab 12/09/18 0650 12/11/18 0901 12/11/18 1936 12/14/18 0658  NA 135 133* 134* 133*  K 3.4* 3.4* 4.2 4.3  CL 95* 94* 97* 94*  CO2 26 26 24 23   GLUCOSE 111* 127* 131* 132*  BUN 23* 35* 24* 48*  CREATININE 4.83* 5.28* 3.85* 6.42*  CALCIUM 9.5 10.0 9.5 10.0  PHOS 4.6 5.1* 2.9 5.8*    ABG: No results for input(s): PHART, PCO2ART, PO2ART, HCO3, O2SAT in the last 168 hours.  Liver Function Tests: Recent Labs  Lab 12/09/18 0650 12/11/18 0901 12/11/18 1936 12/14/18 0658  ALBUMIN 1.6* 2.0* 2.1* 2.4*   No results for input(s): LIPASE, AMYLASE in the last 168 hours. No results for input(s): AMMONIA in the last 168 hours.  CBC: Recent Labs  Lab 12/09/18 0650 12/11/18 0900 12/11/18 1936 12/14/18 0658  WBC 12.1* 13.6* 19.2* 14.6*  HGB 7.4* 7.8* 7.9* 8.6*  HCT 25.0* 27.0* 27.5* 29.4*  MCV 92.6 90.9 91.4 93.3   PLT 453* 447* 371 413*    Cardiac Enzymes: No results for input(s): CKTOTAL, CKMB, CKMBINDEX, TROPONINI in the last 168 hours.  BNP (last 3 results) Recent Labs    05/13/18 0333  BNP 78.9    ProBNP (last 3 results) No results for input(s): PROBNP in the last 8760 hours.  Radiological Exams: No results found.  Assessment/Plan Principal Problem:   Acute on chronic respiratory failure with hypoxia (HCC) Active Problems:   Acute on chronic diastolic CHF (congestive heart failure) (HCC)   CKD (chronic kidney disease) stage 5, GFR less than 15 ml/min (HCC)   Cardiac arrest (HCC)   End stage renal disease on dialysis ([place])   Healthcare-associated pneumonia   1. Acute on chronic respiratory failure with hypoxia continue with T collar trials continue to encourage compliance with the PMV and try to advance to capping 2. Chronic kidney disease end-stage on dialysis followed by nephrology 3. Cardiac arrest rhythm is stable 4. Healthcare associated pneumonia treated we will continue to follow 5. Acute on chronic diastolic heart failure monitor fluid status closely   I have personally seen and evaluated the patient, evaluated laboratory and imaging results, formulated the assessment and plan and placed orders. The Patient requires high complexity decision making for assessment and support.  Case was discussed on Rounds with the Respiratory Therapy Staff  Allyne Gee, MD [facility] Pulmonary Critical Care  Medicine Sleep Medicine

## 2018-12-16 LAB — CBC
HCT: 26.3 % — ABNORMAL LOW (ref 36.0–46.0)
Hemoglobin: 7.9 g/dL — ABNORMAL LOW (ref 12.0–15.0)
MCH: 27.3 pg (ref 26.0–34.0)
MCHC: 30 g/dL (ref 30.0–36.0)
MCV: 91 fL (ref 80.0–100.0)
NRBC: 0.4 % — AB (ref 0.0–0.2)
Platelets: 339 10*3/uL (ref 150–400)
RBC: 2.89 MIL/uL — AB (ref 3.87–5.11)
RDW: 18.4 % — ABNORMAL HIGH (ref 11.5–15.5)
WBC: 14.1 10*3/uL — ABNORMAL HIGH (ref 4.0–10.5)

## 2018-12-16 LAB — RENAL FUNCTION PANEL
Albumin: 2.1 g/dL — ABNORMAL LOW (ref 3.5–5.0)
Anion gap: 15 (ref 5–15)
BUN: 46 mg/dL — ABNORMAL HIGH (ref 6–20)
CO2: 23 mmol/L (ref 22–32)
Calcium: 9.7 mg/dL (ref 8.9–10.3)
Chloride: 93 mmol/L — ABNORMAL LOW (ref 98–111)
Creatinine, Ser: 6.2 mg/dL — ABNORMAL HIGH (ref 0.44–1.00)
GFR, EST AFRICAN AMERICAN: 8 mL/min — AB (ref >60)
GFR, EST NON AFRICAN AMERICAN: 7 mL/min — AB (ref >60)
Glucose, Bld: 118 mg/dL — ABNORMAL HIGH (ref 70–99)
Phosphorus: 6.5 mg/dL — ABNORMAL HIGH (ref 2.5–4.6)
Potassium: 4.3 mmol/L (ref 3.5–5.1)
Sodium: 131 mmol/L — ABNORMAL LOW (ref 135–145)

## 2018-12-16 NOTE — Progress Notes (Signed)
Pulmonary Critical Care Medicine [place]   PULMONARY CRITICAL CARE SERVICE  PROGRESS NOTE  Date of Service: 12/16/2018  JAYANNA KROEGER  FIE:[id number]  DOB: May 21, 1961   DOA: 11/09/2018  Referring Physician: Merton Border, MD  HPI: BENJAMIN MERRIHEW is a 57 y.o. female seen for follow up of Acute on Chronic Respiratory Failure.  Currently she is on T collar has been tolerating PMV well  Medications: Reviewed on Rounds  Physical Exam:  Vitals: Temperature 97.0 pulse 81 respiratory 15 blood pressure 164/92 saturations 100%  Ventilator Settings off the ventilator on T collar  . General: Comfortable at this time . Eyes: Grossly normal lids, irises & conjunctiva . ENT: grossly tongue is normal . Neck: no obvious mass . Cardiovascular: S1 S2 normal no gallop . Respiratory: No rhonchi or rales are noted . Abdomen: soft . Skin: no rash seen on limited exam . Musculoskeletal: not rigid . Psychiatric:unable to assess . Neurologic: no seizure no involuntary movements         Lab Data:   Basic Metabolic Panel: Recent Labs  Lab 12/11/18 0901 12/11/18 1936 12/14/18 0658 12/16/18 0622  NA 133* 134* 133* 131*  K 3.4* 4.2 4.3 4.3  CL 94* 97* 94* 93*  CO2 26 24 23 23   GLUCOSE 127* 131* 132* 118*  BUN 35* 24* 48* 46*  CREATININE 5.28* 3.85* 6.42* 6.20*  CALCIUM 10.0 9.5 10.0 9.7  PHOS 5.1* 2.9 5.8* 6.5*    ABG: No results for input(s): PHART, PCO2ART, PO2ART, HCO3, O2SAT in the last 168 hours.  Liver Function Tests: Recent Labs  Lab 12/11/18 0901 12/11/18 1936 12/14/18 0658 12/16/18 0622  ALBUMIN 2.0* 2.1* 2.4* 2.1*   No results for input(s): LIPASE, AMYLASE in the last 168 hours. No results for input(s): AMMONIA in the last 168 hours.  CBC: Recent Labs  Lab 12/11/18 0900 12/11/18 1936 12/14/18 0658 12/16/18 0622  WBC 13.6* 19.2* 14.6* 14.1*  HGB 7.8* 7.9* 8.6* 7.9*  HCT 27.0* 27.5* 29.4* 26.3*  MCV 90.9 91.4 93.3 91.0  PLT 447* 371  413* 339    Cardiac Enzymes: No results for input(s): CKTOTAL, CKMB, CKMBINDEX, TROPONINI in the last 168 hours.  BNP (last 3 results) Recent Labs    05/13/18 0333  BNP 78.9    ProBNP (last 3 results) No results for input(s): PROBNP in the last 8760 hours.  Radiological Exams: No results found.  Assessment/Plan Principal Problem:   Acute on chronic respiratory failure with hypoxia (HCC) Active Problems:   Acute on chronic diastolic CHF (congestive heart failure) (HCC)   CKD (chronic kidney disease) stage 5, GFR less than 15 ml/min (HCC)   Cardiac arrest (HCC)   End stage renal disease on dialysis ([place])   Healthcare-associated pneumonia   1. Acute on chronic respiratory failure with hypoxia doing well with the PMV initiate advanced capping 2. Acute on chronic diastolic heart failure compensated we will continue with present management 3. Cardiac arrest rhythm has been stable 4. End-stage renal disease on dialysis we will continue present management 5. Healthcare associated pneumonia treated we will continue to follow   I have personally seen and evaluated the patient, evaluated laboratory and imaging results, formulated the assessment and plan and placed orders. The Patient requires high complexity decision making for assessment and support.  Case was discussed on Rounds with the Respiratory Therapy Staff  Allyne Gee, MD [facility] Pulmonary Critical Care Medicine Sleep Medicine

## 2018-12-17 LAB — RENAL FUNCTION PANEL
Albumin: 2 g/dL — ABNORMAL LOW (ref 3.5–5.0)
Anion gap: 15 (ref 5–15)
BUN: 53 mg/dL — ABNORMAL HIGH (ref 6–20)
CO2: 23 mmol/L (ref 22–32)
Calcium: 9.7 mg/dL (ref 8.9–10.3)
Chloride: 92 mmol/L — ABNORMAL LOW (ref 98–111)
Creatinine, Ser: 6.95 mg/dL — ABNORMAL HIGH (ref 0.44–1.00)
GFR calc Af Amer: 7 mL/min — ABNORMAL LOW (ref >60)
GFR calc non Af Amer: 6 mL/min — ABNORMAL LOW (ref >60)
Glucose, Bld: 134 mg/dL — ABNORMAL HIGH (ref 70–99)
Phosphorus: 7 mg/dL — ABNORMAL HIGH (ref 2.5–4.6)
Potassium: 4.6 mmol/L (ref 3.5–5.1)
Sodium: 130 mmol/L — ABNORMAL LOW (ref 135–145)

## 2018-12-17 LAB — CBC
HCT: 27.6 % — ABNORMAL LOW (ref 36.0–46.0)
Hemoglobin: 8 g/dL — ABNORMAL LOW (ref 12.0–15.0)
MCH: 26.8 pg (ref 26.0–34.0)
MCHC: 29 g/dL — ABNORMAL LOW (ref 30.0–36.0)
MCV: 92.3 fL (ref 80.0–100.0)
NRBC: 0.2 % (ref 0.0–0.2)
Platelets: 360 10*3/uL (ref 150–400)
RBC: 2.99 MIL/uL — AB (ref 3.87–5.11)
RDW: 18.5 % — ABNORMAL HIGH (ref 11.5–15.5)
WBC: 11.2 10*3/uL — ABNORMAL HIGH (ref 4.0–10.5)

## 2018-12-17 LAB — C DIFFICILE QUICK SCREEN W PCR REFLEX
C Diff antigen: NEGATIVE
C Diff interpretation: NOT DETECTED
C Diff toxin: NEGATIVE

## 2018-12-17 NOTE — Progress Notes (Signed)
[facility]  ROUNDING NOTE   Subjective:  Patient seen at bedside. Appears to be in good spirits today. Due for dialysis today.  Objective:  Vital signs in last 24 hours:  Temperature 97.5 pulse 80 respirations 33 blood pressure 120/60  Physical Exam: General: No acute distress  Head: Normocephalic, atraumatic. Moist oral mucosal membranes  Eyes: Anicteric  Neck: Tracheostomy in place  Lungs:  Scattered rhonchi, normal effort  Heart: S1S2 no rubs  Abdomen:  Soft, nontender, bowel sounds present  Extremities: Trace peripheral edema.  Neurologic: Awake, alert, following commands  Skin: No lesions  Access: LUE AVF, L IJ PC    Basic Metabolic Panel: Recent Labs  Lab 12/11/18 0901 12/11/18 1936 12/14/18 0658 12/16/18 0622  NA 133* 134* 133* 131*  K 3.4* 4.2 4.3 4.3  CL 94* 97* 94* 93*  CO2 26 24 23 23   GLUCOSE 127* 131* 132* 118*  BUN 35* 24* 48* 46*  CREATININE 5.28* 3.85* 6.42* 6.20*  CALCIUM 10.0 9.5 10.0 9.7  PHOS 5.1* 2.9 5.8* 6.5*    Liver Function Tests: Recent Labs  Lab 12/11/18 0901 12/11/18 1936 12/14/18 0658 12/16/18 0622  ALBUMIN 2.0* 2.1* 2.4* 2.1*   No results for input(s): LIPASE, AMYLASE in the last 168 hours. No results for input(s): AMMONIA in the last 168 hours.  CBC: Recent Labs  Lab 12/11/18 0900 12/11/18 1936 12/14/18 0658 12/16/18 0622 12/17/18 0439  WBC 13.6* 19.2* 14.6* 14.1* 11.2*  HGB 7.8* 7.9* 8.6* 7.9* 8.0*  HCT 27.0* 27.5* 29.4* 26.3* 27.6*  MCV 90.9 91.4 93.3 91.0 92.3  PLT 447* 371 413* 339 360    Cardiac Enzymes: No results for input(s): CKTOTAL, CKMB, CKMBINDEX, TROPONINI in the last 168 hours.  BNP: Invalid input(s): POCBNP  CBG: No results for input(s): GLUCAP in the last 168 hours.  Microbiology: Results for orders placed or performed during the hospital encounter of 11/09/18  Culture, respiratory     Status: None   Collection Time: 11/29/18 12:27 PM  Result Value Ref Range Status   Specimen Description TRACHEAL ASPIRATE  Final   Special Requests NONE  Final   Gram Stain   Final    FEW WBC PRESENT, PREDOMINANTLY PMN NO ORGANISMS SEEN Performed at [facility], [address] [place], [place] [address]    Culture FEW STAPHYLOCOCCUS LUGDUNENSIS  Final   Report Status 12/02/2018 FINAL  Final   Organism ID, Bacteria STAPHYLOCOCCUS LUGDUNENSIS  Final      Susceptibility   Staphylococcus lugdunensis - MIC*    CIPROFLOXACIN 4 RESISTANT Resistant     ERYTHROMYCIN >=8 RESISTANT Resistant     GENTAMICIN <=0.5 SENSITIVE Sensitive     OXACILLIN >=4 RESISTANT Resistant     TETRACYCLINE >=16 RESISTANT Resistant     TRIMETH/SULFA 80 RESISTANT Resistant     CLINDAMYCIN >=8 RESISTANT Resistant     RIFAMPIN <=0.5 SENSITIVE Sensitive     Inducible Clindamycin NEGATIVE Sensitive     * FEW STAPHYLOCOCCUS LUGDUNENSIS  Culture, blood (routine x 2)     Status: None   Collection Time: 11/29/18  3:52 PM  Result Value Ref Range Status   Specimen Description BLOOD RIGHT THUMB  Final   Special Requests   Final    BOTTLES DRAWN AEROBIC ONLY Blood Culture adequate volume   Culture   Final    NO GROWTH 5 DAYS Performed at [facility], [address], [place], [place] [address]    Report Status 12/04/2018 FINAL  Final  Culture, blood (routine x 2)     Status: None   Collection Time: 11/29/18  3:56 PM  Result Value Ref Range Status   Specimen Description BLOOD RIGHT HAND  Final   Special Requests   Final    BOTTLES DRAWN AEROBIC ONLY Blood Culture adequate volume   Culture   Final    NO GROWTH 5 DAYS Performed at [facility], [address] [place], [place] [address]    Report Status 12/04/2018 FINAL  Final  C difficile quick scan w PCR reflex     Status: None   Collection Time: 12/16/18  9:20 PM  Result Value Ref Range Status   C Diff antigen NEGATIVE NEGATIVE Final   C Diff toxin NEGATIVE NEGATIVE Final   C Diff interpretation No C. difficile detected.   Final    Comment: Performed at [facility], [place] [address] [place], [place] [address]    Coagulation Studies: No results for input(s): LABPROT, INR in the last 72 hours.  Urinalysis: No results for input(s): COLORURINE, LABSPEC, PHURINE, GLUCOSEU, HGBUR, BILIRUBINUR, KETONESUR, PROTEINUR, UROBILINOGEN, NITRITE, LEUKOCYTESUR in the last 72 hours.  Invalid input(s): APPERANCEUR    Imaging: No results found.   Medications:       Assessment/ Plan:  57 y.o. female with a PMHx of recurrent V. fib/PEA arrest, recent acute respiratory failure, COPD, obstructive sleep apnea, diastolic heart failure, multifactorial anemia, diabetes mellitus type 2, who was admitted to [facility] on 11/01/2018 for ongoing care.    1.  Acute renal failure/chronic kidney disease stage IV.  Patient dialysis dependent since late October.   -Patient due for dialysis today.  Orders have been prepared.  Thereafter next dialysis will be on Wednesday.  2.  Acute respiratory failure.  Patient remains on aerosolized trach collar and appears to be tolerating this well.  3.  Anemia chronic kidney disease.     Hemoglobin up to 8.0.  Continue to monitor CBC.  4.  Secondary hyperparathyroidism.  Serum phosphorus was up to 6.5 yesterday.  We are repeating serum phosphorus today.   LOS: 0 Catherine Williams 12/30/20198:21 AM

## 2018-12-17 NOTE — Progress Notes (Signed)
Pulmonary Critical Care Medicine [place]   PULMONARY CRITICAL CARE SERVICE  PROGRESS NOTE  Date of Service: 12/17/2018  Catherine Williams  OEV:[id number]  DOB: [birth year]   DOA: 11/09/2018  Referring Physician: Merton Border, MD  HPI: Catherine Williams is a 57 y.o. female seen for follow up of Acute on Chronic Respiratory Failure.  Patient right now is on T collar is doing fairly well.  Has been on 28% oxygen.  Was attempted at capping trials however failed  Medications: Reviewed on Rounds  Physical Exam:  Vitals: Temperature 97.5 pulse 80 respiratory 23 blood pressure 123/65 saturations 97%  Ventilator Settings on T collar FiO2 28%  . General: Comfortable at this time . Eyes: Grossly normal lids, irises & conjunctiva . ENT: grossly tongue is normal . Neck: no obvious mass . Cardiovascular: S1 S2 normal no gallop . Respiratory: No rhonchi or rales are noted . Abdomen: soft . Skin: no rash seen on limited exam . Musculoskeletal: not rigid . Psychiatric:unable to assess . Neurologic: no seizure no involuntary movements         Lab Data:   Basic Metabolic Panel: Recent Labs  Lab 12/11/18 0901 12/11/18 1936 12/14/18 0658 12/16/18 0622 12/17/18 0924  NA 133* 134* 133* 131* 130*  K 3.4* 4.2 4.3 4.3 4.6  CL 94* 97* 94* 93* 92*  CO2 26 24 23 23 23   GLUCOSE 127* 131* 132* 118* 134*  BUN 35* 24* 48* 46* 53*  CREATININE 5.28* 3.85* 6.42* 6.20* 6.95*  CALCIUM 10.0 9.5 10.0 9.7 9.7  PHOS 5.1* 2.9 5.8* 6.5* 7.0*    ABG: No results for input(s): PHART, PCO2ART, PO2ART, HCO3, O2SAT in the last 168 hours.  Liver Function Tests: Recent Labs  Lab 12/11/18 0901 12/11/18 1936 12/14/18 0658 12/16/18 0622 12/17/18 0924  ALBUMIN 2.0* 2.1* 2.4* 2.1* 2.0*   No results for input(s): LIPASE, AMYLASE in the last 168 hours. No results for input(s): AMMONIA in the last 168 hours.  CBC: Recent Labs  Lab 12/11/18 0900 12/11/18 1936 12/14/18 0658  12/16/18 0622 12/17/18 0439  WBC 13.6* 19.2* 14.6* 14.1* 11.2*  HGB 7.8* 7.9* 8.6* 7.9* 8.0*  HCT 27.0* 27.5* 29.4* 26.3* 27.6*  MCV 90.9 91.4 93.3 91.0 92.3  PLT 447* 371 413* 339 360    Cardiac Enzymes: No results for input(s): CKTOTAL, CKMB, CKMBINDEX, TROPONINI in the last 168 hours.  BNP (last 3 results) Recent Labs    05/13/18 0333  BNP 78.9    ProBNP (last 3 results) No results for input(s): PROBNP in the last 8760 hours.  Radiological Exams: No results found.  Assessment/Plan Principal Problem:   Acute on chronic respiratory failure with hypoxia (HCC) Active Problems:   Acute on chronic diastolic CHF (congestive heart failure) (HCC)   CKD (chronic kidney disease) stage 5, GFR less than 15 ml/min (HCC)   Cardiac arrest (HCC)   End stage renal disease on dialysis ([place])   Healthcare-associated pneumonia   1. Acute on chronic respiratory failure with hypoxia we will continue with T collar continue secretion management pulmonary toilet.  Patient has been attempted at capping and failed.  Patient had the trach downsized and then was capping attempted again and did not tolerate it. 2. Acute on chronic diastolic heart failure continue with fluid removal per dialysis 3. Chronic kidney disease end-stage followed by nephrology for dialysis 4. Cardiac arrest rhythm is been stable 5. End-stage renal disease on dialysis 6. Healthcare associated pneumonia treated   I have  personally seen and evaluated the patient, evaluated laboratory and imaging results, formulated the assessment and plan and placed orders. The Patient requires high complexity decision making for assessment and support.  Case was discussed on Rounds with the Respiratory Therapy Staff  Allyne Gee, MD [facility] Pulmonary Critical Care Medicine Sleep Medicine

## 2018-12-18 ENCOUNTER — Other Ambulatory Visit (HOSPITAL_COMMUNITY): Payer: Self-pay

## 2018-12-18 MED ORDER — PERFLUTREN LIPID MICROSPHERE
1.0000 mL | INTRAVENOUS | Status: AC | PRN
  Administered 2018-12-05: 2 mL via INTRAVENOUS

## 2018-12-18 NOTE — Progress Notes (Signed)
Pulmonary Critical Care Medicine [place]   PULMONARY CRITICAL CARE SERVICE  PROGRESS NOTE  Date of Service: 12/18/2018  Catherine Williams  QJJ:[id number]  DOB: 02-13-1961   DOA: 11/09/2018  Referring Physician: Merton Border, MD  HPI: Catherine Williams is a 57 y.o. female seen for follow up of Acute on Chronic Respiratory Failure.  Patient is on T collar was attempted at capping she failed ENT has seen the patient able to do an upper airway examination  Medications: Reviewed on Rounds  Physical Exam:  Vitals: Temperature 97.8 pulse 88 respiratory rate 27 blood pressure 152/50 saturations 100%  Ventilator Settings off the ventilator on T collar with PMV now  . General: Comfortable at this time . Eyes: Grossly normal lids, irises & conjunctiva . ENT: grossly tongue is normal . Neck: no obvious mass . Cardiovascular: S1 S2 normal no gallop . Respiratory: Rhonchi no rales are noted at this time . Abdomen: soft . Skin: no rash seen on limited exam . Musculoskeletal: not rigid . Psychiatric:unable to assess . Neurologic: no seizure no involuntary movements         Lab Data:   Basic Metabolic Panel: Recent Labs  Lab 12/11/18 1936 12/14/18 0658 12/16/18 0622 12/17/18 0924  NA 134* 133* 131* 130*  K 4.2 4.3 4.3 4.6  CL 97* 94* 93* 92*  CO2 24 23 23 23   GLUCOSE 131* 132* 118* 134*  BUN 24* 48* 46* 53*  CREATININE 3.85* 6.42* 6.20* 6.95*  CALCIUM 9.5 10.0 9.7 9.7  PHOS 2.9 5.8* 6.5* 7.0*    ABG: No results for input(s): PHART, PCO2ART, PO2ART, HCO3, O2SAT in the last 168 hours.  Liver Function Tests: Recent Labs  Lab 12/11/18 1936 12/14/18 0658 12/16/18 0622 12/17/18 0924  ALBUMIN 2.1* 2.4* 2.1* 2.0*   No results for input(s): LIPASE, AMYLASE in the last 168 hours. No results for input(s): AMMONIA in the last 168 hours.  CBC: Recent Labs  Lab 12/11/18 1936 12/14/18 0658 12/16/18 0622 12/17/18 0439  WBC 19.2* 14.6* 14.1* 11.2*  HGB  7.9* 8.6* 7.9* 8.0*  HCT 27.5* 29.4* 26.3* 27.6*  MCV 91.4 93.3 91.0 92.3  PLT 371 413* 339 360    Cardiac Enzymes: No results for input(s): CKTOTAL, CKMB, CKMBINDEX, TROPONINI in the last 168 hours.  BNP (last 3 results) Recent Labs    05/13/18 0333  BNP 78.9    ProBNP (last 3 results) No results for input(s): PROBNP in the last 8760 hours.  Radiological Exams: No results found.  Assessment/Plan Principal Problem:   Acute on chronic respiratory failure with hypoxia (HCC) Active Problems:   Acute on chronic diastolic CHF (congestive heart failure) (HCC)   CKD (chronic kidney disease) stage 5, GFR less than 15 ml/min (HCC)   Cardiac arrest (HCC)   End stage renal disease on dialysis ([place])   Healthcare-associated pneumonia   1. Acute on chronic respiratory failure with hypoxia we will continue with the T collar trials await ENT input 2. Acute on chronic diastolic heart failure compensated 3. Chronic kidney disease followed by nephrology 4. Cardiac arrest rhythm is stable 5. End-stage renal disease on dialysis 6. Healthcare associated pneumonia treated improved   I have personally seen and evaluated the patient, evaluated laboratory and imaging results, formulated the assessment and plan and placed orders. The Patient requires high complexity decision making for assessment and support.  Case was discussed on Rounds with the Respiratory Therapy Staff  Allyne Gee, MD [facility] Pulmonary Critical Care  Medicine Sleep Medicine

## 2018-12-19 DIAGNOSIS — N186 End stage renal disease: Secondary | ICD-10-CM | POA: Diagnosis not present

## 2018-12-19 LAB — CBC
HCT: 26 % — ABNORMAL LOW (ref 36.0–46.0)
Hemoglobin: 7.7 g/dL — ABNORMAL LOW (ref 12.0–15.0)
MCH: 26.3 pg (ref 26.0–34.0)
MCHC: 29.6 g/dL — ABNORMAL LOW (ref 30.0–36.0)
MCV: 88.7 fL (ref 80.0–100.0)
Platelets: 277 10*3/uL (ref 150–400)
RBC: 2.93 MIL/uL — ABNORMAL LOW (ref 3.87–5.11)
RDW: 18 % — ABNORMAL HIGH (ref 11.5–15.5)
WBC: 13.2 10*3/uL — ABNORMAL HIGH (ref 4.0–10.5)
nRBC: 0.2 % (ref 0.0–0.2)

## 2018-12-19 LAB — RENAL FUNCTION PANEL
Albumin: 2.2 g/dL — ABNORMAL LOW (ref 3.5–5.0)
Anion gap: 12 (ref 5–15)
BUN: 45 mg/dL — ABNORMAL HIGH (ref 6–20)
CO2: 24 mmol/L (ref 22–32)
Calcium: 9.9 mg/dL (ref 8.9–10.3)
Chloride: 93 mmol/L — ABNORMAL LOW (ref 98–111)
Creatinine, Ser: 5.8 mg/dL — ABNORMAL HIGH (ref 0.44–1.00)
GFR calc Af Amer: 9 mL/min — ABNORMAL LOW (ref >60)
GFR calc non Af Amer: 7 mL/min — ABNORMAL LOW (ref >60)
Glucose, Bld: 149 mg/dL — ABNORMAL HIGH (ref 70–99)
Phosphorus: 6.1 mg/dL — ABNORMAL HIGH (ref 2.5–4.6)
Potassium: 4.6 mmol/L (ref 3.5–5.1)
Sodium: 129 mmol/L — ABNORMAL LOW (ref 135–145)

## 2018-12-19 NOTE — Progress Notes (Signed)
[facility]  ROUNDING NOTE   Subjective:  Patient seen and evaluated during hemodialysis. Tolerating well. Sitting up in chair.  Objective:  Vital signs in last 24 hours:  Temperature 97.2 pulse 92 respirations 10 blood pressure 147/51  Physical Exam: General: No acute distress  Head: Normocephalic, atraumatic. Moist oral mucosal membranes  Eyes: Anicteric  Neck: Tracheostomy in place  Lungs:  Scattered rhonchi, normal effort  Heart: S1S2 no rubs  Abdomen:  Soft, nontender, bowel sounds present  Extremities: Trace peripheral edema.  Neurologic: Awake, alert, following commands  Skin: No lesions  Access: LUE AVF, L IJ PC    Basic Metabolic Panel: Recent Labs  Lab 12/14/18 0658 12/16/18 0622 12/17/18 0924 12/19/18 0629  NA 133* 131* 130* 129*  K 4.3 4.3 4.6 4.6  CL 94* 93* 92* 93*  CO2 23 23 23 24   GLUCOSE 132* 118* 134* 149*  BUN 48* 46* 53* 45*  CREATININE 6.42* 6.20* 6.95* 5.80*  CALCIUM 10.0 9.7 9.7 9.9  PHOS 5.8* 6.5* 7.0* 6.1*    Liver Function Tests: Recent Labs  Lab 12/14/18 0658 12/16/18 0622 12/17/18 0924 12/19/18 0629  ALBUMIN 2.4* 2.1* 2.0* 2.2*   No results for input(s): LIPASE, AMYLASE in the last 168 hours. No results for input(s): AMMONIA in the last 168 hours.  CBC: Recent Labs  Lab 12/14/18 0658 12/16/18 0622 12/17/18 0439 12/19/18 0629  WBC 14.6* 14.1* 11.2* 13.2*  HGB 8.6* 7.9* 8.0* 7.7*  HCT 29.4* 26.3* 27.6* 26.0*  MCV 93.3 91.0 92.3 88.7  PLT 413* 339 360 277    Cardiac Enzymes: No results for input(s): CKTOTAL, CKMB, CKMBINDEX, TROPONINI in the last 168 hours.  BNP: Invalid input(s): POCBNP  CBG: No results for input(s): GLUCAP in the last 168 hours.  Microbiology: Results for orders placed or performed during the hospital encounter of 11/09/18  Culture, respiratory     Status: None   Collection Time: 11/29/18 12:27 PM  Result Value Ref Range Status   Specimen Description TRACHEAL ASPIRATE  Final    Special Requests NONE  Final   Gram Stain   Final    FEW WBC PRESENT, PREDOMINANTLY PMN NO ORGANISMS SEEN Performed at [facility], [address], [place], [place] [address]    Culture FEW STAPHYLOCOCCUS LUGDUNENSIS  Final   Report Status 12/02/2018 FINAL  Final   Organism ID, Bacteria STAPHYLOCOCCUS LUGDUNENSIS  Final      Susceptibility   Staphylococcus lugdunensis - MIC*    CIPROFLOXACIN 4 RESISTANT Resistant     ERYTHROMYCIN >=8 RESISTANT Resistant     GENTAMICIN <=0.5 SENSITIVE Sensitive     OXACILLIN >=4 RESISTANT Resistant     TETRACYCLINE >=16 RESISTANT Resistant     TRIMETH/SULFA 80 RESISTANT Resistant     CLINDAMYCIN >=8 RESISTANT Resistant     RIFAMPIN <=0.5 SENSITIVE Sensitive     Inducible Clindamycin NEGATIVE Sensitive     * FEW STAPHYLOCOCCUS LUGDUNENSIS  Culture, blood (routine x 2)     Status: None   Collection Time: 11/29/18  3:52 PM  Result Value Ref Range Status   Specimen Description BLOOD RIGHT THUMB  Final   Special Requests   Final    BOTTLES DRAWN AEROBIC ONLY Blood Culture adequate volume   Culture   Final    NO GROWTH 5 DAYS Performed at [facility], [address] [place], [place] [address]    Report Status 12/04/2018 FINAL  Final  Culture, blood (routine x 2)  Status: None   Collection Time: 11/29/18  3:56 PM  Result Value Ref Range Status   Specimen Description BLOOD RIGHT HAND  Final   Special Requests   Final    BOTTLES DRAWN AEROBIC ONLY Blood Culture adequate volume   Culture   Final    NO GROWTH 5 DAYS Performed at [facility], [address] [place], [place] [address]    Report Status 12/04/2018 FINAL  Final  C difficile quick scan w PCR reflex     Status: None   Collection Time: 12/16/18  9:20 PM  Result Value Ref Range Status   C Diff antigen NEGATIVE NEGATIVE Final   C Diff toxin NEGATIVE NEGATIVE Final   C Diff interpretation No C. difficile detected.  Final    Comment: Performed at [facility], [place] [address] [place], [place] [address]    Coagulation Studies: No results for input(s): LABPROT, INR in the last 72 hours.  Urinalysis: No results for input(s): COLORURINE, LABSPEC, PHURINE, GLUCOSEU, HGBUR, BILIRUBINUR, KETONESUR, PROTEINUR, UROBILINOGEN, NITRITE, LEUKOCYTESUR in the last 72 hours.  Invalid input(s): APPERANCEUR    Imaging: No results found.   Medications:       Assessment/ Plan:  58 y.o. female with a PMHx of recurrent V. fib/PEA arrest, recent acute respiratory failure, COPD, obstructive sleep apnea, diastolic heart failure, multifactorial anemia, diabetes mellitus type 2, who was admitted to [facility] on 11/01/2018 for ongoing care.    1.  Acute renal failure/chronic kidney disease stage IV.  Patient dialysis dependent since late October.   -Patient seen and evaluated during hemodialysis.  Tolerating well.  We plan to complete dialysis treatment today.  2.  Acute respiratory failure.  Patient continues to be doing well off the ventilator.  She remains on aerosolized trach collar.  3.  Anemia chronic kidney disease.     Hemoglobin still a bit low at 7.7.  Continue erythropoietin 10,000 units IV with each dialysis treatment.  4.  Secondary hyperparathyroidism.  Phosphorus down to 6.1.  Continue to monitor periodically.   LOS: 0 Catherine Williams 1/1/202011:08 AM

## 2018-12-19 NOTE — Progress Notes (Addendum)
Pulmonary Critical Care Medicine [place]   PULMONARY CRITICAL CARE SERVICE  PROGRESS NOTE  Date of Service: 12/19/2018  Catherine Williams  OIZ:[id number]  DOB: 05/19/1961   DOA: 11/09/2018  Referring Physician: Merton Border, MD  HPI: Catherine Williams is a 58 y.o. female seen for follow up of Acute on Chronic Respiratory Failure.  Patient is doing well on 28% trach collar.  Minimal secretions noted.  ENT set to see patient tomorrow.  Medications: Reviewed on Rounds  Physical Exam:  Vitals: Pulse 92 respirations 12 blood pressure 147/51 O2 sat 100% temp 97.2  Ventilator Settings patient's not currently on ventilator  . General: Comfortable at this time . Eyes: Grossly normal lids, irises & conjunctiva . ENT: grossly tongue is normal . Neck: no obvious mass . Cardiovascular: S1 S2 normal no gallop . Respiratory: No wheezes or rhonchi noted . Abdomen: soft . Skin: no rash seen on limited exam . Musculoskeletal: not rigid . Psychiatric:unable to assess . Neurologic: no seizure no involuntary movements         Lab Data:   Basic Metabolic Panel: Recent Labs  Lab 12/14/18 0658 12/16/18 0622 12/17/18 0924 12/19/18 0629  NA 133* 131* 130* 129*  K 4.3 4.3 4.6 4.6  CL 94* 93* 92* 93*  CO2 23 23 23 24   GLUCOSE 132* 118* 134* 149*  BUN 48* 46* 53* 45*  CREATININE 6.42* 6.20* 6.95* 5.80*  CALCIUM 10.0 9.7 9.7 9.9  PHOS 5.8* 6.5* 7.0* 6.1*    ABG: No results for input(s): PHART, PCO2ART, PO2ART, HCO3, O2SAT in the last 168 hours.  Liver Function Tests: Recent Labs  Lab 12/14/18 0658 12/16/18 0622 12/17/18 0924 12/19/18 0629  ALBUMIN 2.4* 2.1* 2.0* 2.2*   No results for input(s): LIPASE, AMYLASE in the last 168 hours. No results for input(s): AMMONIA in the last 168 hours.  CBC: Recent Labs  Lab 12/14/18 0658 12/16/18 0622 12/17/18 0439 12/19/18 0629  WBC 14.6* 14.1* 11.2* 13.2*  HGB 8.6* 7.9* 8.0* 7.7*  HCT 29.4* 26.3* 27.6* 26.0*  MCV  93.3 91.0 92.3 88.7  PLT 413* 339 360 277    Cardiac Enzymes: No results for input(s): CKTOTAL, CKMB, CKMBINDEX, TROPONINI in the last 168 hours.  BNP (last 3 results) Recent Labs    05/13/18 0333  BNP 78.9    ProBNP (last 3 results) No results for input(s): PROBNP in the last 8760 hours.  Radiological Exams: No results found.  Assessment/Plan Principal Problem:   Acute on chronic respiratory failure with hypoxia (HCC) Active Problems:   Acute on chronic diastolic CHF (congestive heart failure) (HCC)   CKD (chronic kidney disease) stage 5, GFR less than 15 ml/min (HCC)   Cardiac arrest (HCC)   End stage renal disease on dialysis ([place])   Healthcare-associated pneumonia   1. Acute on chronic respiratory failure with hypoxia continue with trach collar trials awaiting ENTs assessment. 2. Acute on chronic diastolic heart failure compensated 3. Chronic kidney disease followed by nephrology 4. Cardiac arrest rhythm is stable 5. End-stage renal disease on dialysis 6. Healthcare associated pneumonia treatment improved   I have personally seen and evaluated the patient, evaluated laboratory and imaging results, formulated the assessment and plan and placed orders. The Patient requires high complexity decision making for assessment and support.  Case was discussed on Rounds with the Respiratory Therapy Staff  Allyne Gee, MD [facility] Pulmonary Critical Care Medicine Sleep Medicine

## 2018-12-20 NOTE — Progress Notes (Signed)
Pulmonary Critical Care Medicine [place]   PULMONARY CRITICAL CARE SERVICE  PROGRESS NOTE  Date of Service: 12/20/2018  Catherine Williams  OVF:[id number]  DOB: May 15, 1961   DOA: 11/09/2018  Referring Physician: Merton Border, MD  HPI: Catherine Williams is a 58 y.o. female seen for follow up of Acute on Chronic Respiratory Failure.  Patient currently is on T collar has been on 28% FiO2 good saturations are noted  Medications: Reviewed on Rounds  Physical Exam:  Vitals: Temperature 97.2 pulse 89 respiratory rate 20 blood pressure 144/58 saturations 100%  Ventilator Settings currently on T collar  . General: Comfortable at this time . Eyes: Grossly normal lids, irises & conjunctiva . ENT: grossly tongue is normal . Neck: no obvious mass . Cardiovascular: S1 S2 normal no gallop . Respiratory: No rhonchi or rales are noted at this time . Abdomen: soft . Skin: no rash seen on limited exam . Musculoskeletal: not rigid . Psychiatric:unable to assess . Neurologic: no seizure no involuntary movements         Lab Data:   Basic Metabolic Panel: Recent Labs  Lab 12/14/18 0658 12/16/18 0622 12/17/18 0924 12/19/18 0629  NA 133* 131* 130* 129*  K 4.3 4.3 4.6 4.6  CL 94* 93* 92* 93*  CO2 23 23 23 24   GLUCOSE 132* 118* 134* 149*  BUN 48* 46* 53* 45*  CREATININE 6.42* 6.20* 6.95* 5.80*  CALCIUM 10.0 9.7 9.7 9.9  PHOS 5.8* 6.5* 7.0* 6.1*    ABG: No results for input(s): PHART, PCO2ART, PO2ART, HCO3, O2SAT in the last 168 hours.  Liver Function Tests: Recent Labs  Lab 12/14/18 0658 12/16/18 0622 12/17/18 0924 12/19/18 0629  ALBUMIN 2.4* 2.1* 2.0* 2.2*   No results for input(s): LIPASE, AMYLASE in the last 168 hours. No results for input(s): AMMONIA in the last 168 hours.  CBC: Recent Labs  Lab 12/14/18 0658 12/16/18 0622 12/17/18 0439 12/19/18 0629  WBC 14.6* 14.1* 11.2* 13.2*  HGB 8.6* 7.9* 8.0* 7.7*  HCT 29.4* 26.3* 27.6* 26.0*  MCV 93.3  91.0 92.3 88.7  PLT 413* 339 360 277    Cardiac Enzymes: No results for input(s): CKTOTAL, CKMB, CKMBINDEX, TROPONINI in the last 168 hours.  BNP (last 3 results) Recent Labs    05/13/18 0333  BNP 78.9    ProBNP (last 3 results) No results for input(s): PROBNP in the last 8760 hours.  Radiological Exams: No results found.  Assessment/Plan Principal Problem:   Acute on chronic respiratory failure with hypoxia (HCC) Active Problems:   Acute on chronic diastolic CHF (congestive heart failure) (HCC)   CKD (chronic kidney disease) stage 5, GFR less than 15 ml/min (HCC)   Cardiac arrest (HCC)   End stage renal disease on dialysis ([place])   Healthcare-associated pneumonia   1. Acute on chronic respiratory failure with hypoxia we will continue with the T collar wean ENT is going to look at the upper airway. 2. Chronic kidney disease stage V on dialysis 3. Cardiac arrest rhythm is stable 4. End-stage renal disease as above on dialysis 5. Healthcare associated pneumonia treated we will follow   I have personally seen and evaluated the patient, evaluated laboratory and imaging results, formulated the assessment and plan and placed orders. The Patient requires high complexity decision making for assessment and support.  Case was discussed on Rounds with the Respiratory Therapy Staff  Allyne Gee, MD [facility] Pulmonary Critical Care Medicine Sleep Medicine

## 2018-12-21 LAB — RENAL FUNCTION PANEL
Albumin: 2.2 g/dL — ABNORMAL LOW (ref 3.5–5.0)
Anion gap: 12 (ref 5–15)
BUN: 33 mg/dL — AB (ref 6–20)
CALCIUM: 10.2 mg/dL (ref 8.9–10.3)
CO2: 25 mmol/L (ref 22–32)
CREATININE: 5.23 mg/dL — AB (ref 0.44–1.00)
Chloride: 97 mmol/L — ABNORMAL LOW (ref 98–111)
GFR calc Af Amer: 10 mL/min — ABNORMAL LOW (ref >60)
GFR calc non Af Amer: 8 mL/min — ABNORMAL LOW (ref >60)
Glucose, Bld: 129 mg/dL — ABNORMAL HIGH (ref 70–99)
Phosphorus: 5.6 mg/dL — ABNORMAL HIGH (ref 2.5–4.6)
Potassium: 3.7 mmol/L (ref 3.5–5.1)
SODIUM: 134 mmol/L — AB (ref 135–145)

## 2018-12-21 LAB — CBC
HCT: 25.4 % — ABNORMAL LOW (ref 36.0–46.0)
Hemoglobin: 7.2 g/dL — ABNORMAL LOW (ref 12.0–15.0)
MCH: 26.1 pg (ref 26.0–34.0)
MCHC: 28.3 g/dL — ABNORMAL LOW (ref 30.0–36.0)
MCV: 92 fL (ref 80.0–100.0)
NRBC: 0.4 % — AB (ref 0.0–0.2)
Platelets: 276 10*3/uL (ref 150–400)
RBC: 2.76 MIL/uL — ABNORMAL LOW (ref 3.87–5.11)
RDW: 18.4 % — ABNORMAL HIGH (ref 11.5–15.5)
WBC: 11.4 10*3/uL — ABNORMAL HIGH (ref 4.0–10.5)

## 2018-12-21 MED FILL — Perflutren Lipid Microsphere IV Susp 1.1 MG/ML: INTRAVENOUS | Qty: 10 | Status: AC

## 2018-12-21 NOTE — Progress Notes (Signed)
[facility]  ROUNDING NOTE   Subjective:  Patient resting comfortably. Tracheostomy currently capped. Due for dialysis today.  Objective:  Vital signs in last 24 hours:  Temperature 97.6 pulse 92 respiration 16 blood pressure 134/55  Physical Exam: General: No acute distress  Head: Normocephalic, atraumatic. Moist oral mucosal membranes  Eyes: Anicteric  Neck: Tracheostomy in place  Lungs:  Scattered rhonchi, normal effort  Heart: S1S2 no rubs  Abdomen:  Soft, nontender, bowel sounds present  Extremities: Trace peripheral edema.  Neurologic: Awake, alert, following commands  Skin: No lesions  Access: LUE AVF, L IJ PC    Basic Metabolic Panel: Recent Labs  Lab 12/16/18 0622 12/17/18 0924 12/19/18 0629 12/21/18 0635  NA 131* 130* 129* 134*  K 4.3 4.6 4.6 3.7  CL 93* 92* 93* 97*  CO2 23 23 24 25   GLUCOSE 118* 134* 149* 129*  BUN 46* 53* 45* 33*  CREATININE 6.20* 6.95* 5.80* 5.23*  CALCIUM 9.7 9.7 9.9 10.2  PHOS 6.5* 7.0* 6.1* 5.6*    Liver Function Tests: Recent Labs  Lab 12/16/18 0622 12/17/18 0924 12/19/18 0629 12/21/18 0635  ALBUMIN 2.1* 2.0* 2.2* 2.2*   No results for input(s): LIPASE, AMYLASE in the last 168 hours. No results for input(s): AMMONIA in the last 168 hours.  CBC: Recent Labs  Lab 12/16/18 0622 12/17/18 0439 12/19/18 0629 12/21/18 0635  WBC 14.1* 11.2* 13.2* 11.4*  HGB 7.9* 8.0* 7.7* 7.2*  HCT 26.3* 27.6* 26.0* 25.4*  MCV 91.0 92.3 88.7 92.0  PLT 339 360 277 276    Cardiac Enzymes: No results for input(s): CKTOTAL, CKMB, CKMBINDEX, TROPONINI in the last 168 hours.  BNP: Invalid input(s): POCBNP  CBG: No results for input(s): GLUCAP in the last 168 hours.  Microbiology: Results for orders placed or performed during the hospital encounter of 11/09/18  Culture, respiratory     Status: None   Collection Time: 11/29/18 12:27 PM  Result Value Ref Range Status   Specimen Description TRACHEAL ASPIRATE  Final   Special Requests NONE  Final   Gram Stain   Final    FEW WBC PRESENT, PREDOMINANTLY PMN NO ORGANISMS SEEN Performed at [facility], [address], [place], [place] [address]    Culture FEW STAPHYLOCOCCUS LUGDUNENSIS  Final   Report Status 12/02/2018 FINAL  Final   Organism ID, Bacteria STAPHYLOCOCCUS LUGDUNENSIS  Final      Susceptibility   Staphylococcus lugdunensis - MIC*    CIPROFLOXACIN 4 RESISTANT Resistant     ERYTHROMYCIN >=8 RESISTANT Resistant     GENTAMICIN <=0.5 SENSITIVE Sensitive     OXACILLIN >=4 RESISTANT Resistant     TETRACYCLINE >=16 RESISTANT Resistant     TRIMETH/SULFA 80 RESISTANT Resistant     CLINDAMYCIN >=8 RESISTANT Resistant     RIFAMPIN <=0.5 SENSITIVE Sensitive     Inducible Clindamycin NEGATIVE Sensitive     * FEW STAPHYLOCOCCUS LUGDUNENSIS  Culture, blood (routine x 2)     Status: None   Collection Time: 11/29/18  3:52 PM  Result Value Ref Range Status   Specimen Description BLOOD RIGHT THUMB  Final   Special Requests   Final    BOTTLES DRAWN AEROBIC ONLY Blood Culture adequate volume   Culture   Final    NO GROWTH 5 DAYS Performed at [facility], [address], [place], [place] [address]    Report Status 12/04/2018 FINAL  Final  Culture, blood (routine x 2)     Status:  None   Collection Time: 11/29/18  3:56 PM  Result Value Ref Range Status   Specimen Description BLOOD RIGHT HAND  Final   Special Requests   Final    BOTTLES DRAWN AEROBIC ONLY Blood Culture adequate volume   Culture   Final    NO GROWTH 5 DAYS Performed at [facility], [address], [place], [place] [address]    Report Status 12/04/2018 FINAL  Final  C difficile quick scan w PCR reflex     Status: None   Collection Time: 12/16/18  9:20 PM  Result Value Ref Range Status   C Diff antigen NEGATIVE NEGATIVE Final   C Diff toxin NEGATIVE NEGATIVE Final   C Diff interpretation No C. difficile detected.  Final    Comment: Performed at [facility], [place] [address], [place], [place] [address]    Coagulation Studies: No results for input(s): LABPROT, INR in the last 72 hours.  Urinalysis: No results for input(s): COLORURINE, LABSPEC, PHURINE, GLUCOSEU, HGBUR, BILIRUBINUR, KETONESUR, PROTEINUR, UROBILINOGEN, NITRITE, LEUKOCYTESUR in the last 72 hours.  Invalid input(s): APPERANCEUR    Imaging: No results found.   Medications:       Assessment/ Plan:  58 y.o. female with a PMHx of recurrent V. fib/PEA arrest, recent acute respiratory failure, COPD, obstructive sleep apnea, diastolic heart failure, multifactorial anemia, diabetes mellitus type 2, who was admitted to [facility] on 11/01/2018 for ongoing care.    1.  Acute renal failure/chronic kidney disease stage IV.  Patient dialysis dependent since late October.   -Patient due for hemodialysis today.  Orders have been prepared.  Continue dialysis on MWF schedule.  2.  Acute respiratory failure.  Tracheostomy now capped.  She has made significant progress.  Continue to monitor respiratory status.  3.  Anemia chronic kidney disease.     Hemoglobin has drifted down to 7.2 despite erythropoietin injections.  Consider transfusion for hemoglobin of 7 or less.  4.  Secondary hyperparathyroidism.  Phosphorus under better control at 5.6.  Continue to monitor.   LOS: 0 Janelly Switalski 1/3/20208:36 AM

## 2018-12-21 NOTE — Progress Notes (Signed)
Pulmonary Critical Care Medicine [place]   PULMONARY CRITICAL CARE SERVICE  PROGRESS NOTE  Date of Service: 12/21/2018  CRISLYN WILLBANKS  HER:[id number]  DOB: [birth year]   DOA: 11/09/2018  Referring Physician: Merton Border, MD  HPI: Catherine Williams is a 58 y.o. female seen for follow up of Acute on Chronic Respiratory Failure.  Patient is on T collar right now has been on 28% FiO2.  She is tolerating the PMV.  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  This likely explains the reason why she cannot properly phonate and also why she does not do well with capping  Medications: Reviewed on Rounds  Physical Exam:  Vitals: Temperature 97.3 pulse 92 respiratory rate 16 blood pressure 139/55 saturations 96%  Ventilator Settings off the ventilator on T collar with PMV  . General: Comfortable at this time . Eyes: Grossly normal lids, irises & conjunctiva . ENT: grossly tongue is normal . Neck: no obvious mass . Cardiovascular: S1 S2 normal no gallop . Respiratory: No rhonchi or rales are noted at this time . Abdomen: soft . Skin: no rash seen on limited exam . Musculoskeletal: not rigid . Psychiatric:unable to assess . Neurologic: no seizure no involuntary movements         Lab Data:   Basic Metabolic Panel: Recent Labs  Lab 12/16/18 0622 12/17/18 0924 12/19/18 0629 12/21/18 0635  NA 131* 130* 129* 134*  K 4.3 4.6 4.6 3.7  CL 93* 92* 93* 97*  CO2 23 23 24 25   GLUCOSE 118* 134* 149* 129*  BUN 46* 53* 45* 33*  CREATININE 6.20* 6.95* 5.80* 5.23*  CALCIUM 9.7 9.7 9.9 10.2  PHOS 6.5* 7.0* 6.1* 5.6*    ABG: No results for input(s): PHART, PCO2ART, PO2ART, HCO3, O2SAT in the last 168 hours.  Liver Function Tests: Recent Labs  Lab 12/16/18 0622 12/17/18 0924 12/19/18 0629 12/21/18 0635  ALBUMIN 2.1* 2.0* 2.2* 2.2*   No results for input(s): LIPASE, AMYLASE in the  last 168 hours. No results for input(s): AMMONIA in the last 168 hours.  CBC: Recent Labs  Lab 12/16/18 0622 12/17/18 0439 12/19/18 0629 12/21/18 0635  WBC 14.1* 11.2* 13.2* 11.4*  HGB 7.9* 8.0* 7.7* 7.2*  HCT 26.3* 27.6* 26.0* 25.4*  MCV 91.0 92.3 88.7 92.0  PLT 339 360 277 276    Cardiac Enzymes: No results for input(s): CKTOTAL, CKMB, CKMBINDEX, TROPONINI in the last 168 hours.  BNP (last 3 results) Recent Labs    05/13/18 0333  BNP 78.9    ProBNP (last 3 results) No results for input(s): PROBNP in the last 8760 hours.  Radiological Exams: No results found.  Assessment/Plan Principal Problem:   Acute on chronic respiratory failure with hypoxia (HCC) Active Problems:   Acute on chronic diastolic CHF (congestive heart failure) (HCC)   CKD (chronic kidney disease) stage 5, GFR less than 15 ml/min (HCC)   Cardiac arrest (HCC)   End stage renal disease on dialysis ([place])   Healthcare-associated pneumonia   1. Acute on chronic respiratory failure with hypoxia patient will be continued on T collar for now.  We will continue with secretion management pulmonary toilet 2. Chronic kidney disease on hemodialysis this will be continued. 3. Cardiac arrest rhythm has been stable 4. Healthcare associated pneumonia treated resolved 5. Acute on chronic diastolic heart failure she is currently compensated   I have  personally seen and evaluated the patient, evaluated laboratory and imaging results, formulated the assessment and plan and placed orders. The Patient requires high complexity decision making for assessment and support.  Case was discussed on Rounds with the Respiratory Therapy Staff  Allyne Gee, MD [facility] Pulmonary Critical Care Medicine Sleep Medicine

## 2018-12-22 DIAGNOSIS — R2689 Other abnormalities of gait and mobility: Secondary | ICD-10-CM | POA: Diagnosis not present

## 2018-12-22 DIAGNOSIS — E46 Unspecified protein-calorie malnutrition: Secondary | ICD-10-CM | POA: Diagnosis not present

## 2018-12-22 DIAGNOSIS — E662 Morbid (severe) obesity with alveolar hypoventilation: Secondary | ICD-10-CM | POA: Diagnosis not present

## 2018-12-22 DIAGNOSIS — R079 Chest pain, unspecified: Secondary | ICD-10-CM | POA: Diagnosis not present

## 2018-12-22 DIAGNOSIS — I5022 Chronic systolic (congestive) heart failure: Secondary | ICD-10-CM | POA: Diagnosis not present

## 2018-12-22 DIAGNOSIS — K819 Cholecystitis, unspecified: Secondary | ICD-10-CM | POA: Diagnosis not present

## 2018-12-22 DIAGNOSIS — Z9049 Acquired absence of other specified parts of digestive tract: Secondary | ICD-10-CM | POA: Diagnosis not present

## 2018-12-22 DIAGNOSIS — I509 Heart failure, unspecified: Secondary | ICD-10-CM | POA: Diagnosis not present

## 2018-12-22 DIAGNOSIS — K810 Acute cholecystitis: Secondary | ICD-10-CM | POA: Diagnosis not present

## 2018-12-22 DIAGNOSIS — Z743 Need for continuous supervision: Secondary | ICD-10-CM | POA: Diagnosis not present

## 2018-12-22 DIAGNOSIS — E1122 Type 2 diabetes mellitus with diabetic chronic kidney disease: Secondary | ICD-10-CM | POA: Diagnosis not present

## 2018-12-22 DIAGNOSIS — R0600 Dyspnea, unspecified: Secondary | ICD-10-CM | POA: Diagnosis not present

## 2018-12-22 DIAGNOSIS — I5030 Unspecified diastolic (congestive) heart failure: Secondary | ICD-10-CM | POA: Diagnosis not present

## 2018-12-22 DIAGNOSIS — N186 End stage renal disease: Secondary | ICD-10-CM | POA: Diagnosis not present

## 2018-12-22 DIAGNOSIS — D649 Anemia, unspecified: Secondary | ICD-10-CM | POA: Diagnosis not present

## 2018-12-22 DIAGNOSIS — M6281 Muscle weakness (generalized): Secondary | ICD-10-CM | POA: Diagnosis not present

## 2018-12-22 DIAGNOSIS — D631 Anemia in chronic kidney disease: Secondary | ICD-10-CM | POA: Diagnosis not present

## 2018-12-22 DIAGNOSIS — R092 Respiratory arrest: Secondary | ICD-10-CM | POA: Diagnosis not present

## 2018-12-22 DIAGNOSIS — Z992 Dependence on renal dialysis: Secondary | ICD-10-CM | POA: Diagnosis not present

## 2018-12-22 DIAGNOSIS — J449 Chronic obstructive pulmonary disease, unspecified: Secondary | ICD-10-CM | POA: Diagnosis not present

## 2018-12-22 DIAGNOSIS — J9690 Respiratory failure, unspecified, unspecified whether with hypoxia or hypercapnia: Secondary | ICD-10-CM | POA: Diagnosis not present

## 2018-12-23 DIAGNOSIS — D631 Anemia in chronic kidney disease: Secondary | ICD-10-CM | POA: Diagnosis not present

## 2018-12-24 DIAGNOSIS — E871 Hypo-osmolality and hyponatremia: Secondary | ICD-10-CM | POA: Diagnosis not present

## 2018-12-24 DIAGNOSIS — Z4659 Encounter for fitting and adjustment of other gastrointestinal appliance and device: Secondary | ICD-10-CM | POA: Diagnosis not present

## 2018-12-24 DIAGNOSIS — J189 Pneumonia, unspecified organism: Secondary | ICD-10-CM | POA: Diagnosis present

## 2018-12-24 DIAGNOSIS — Z79899 Other long term (current) drug therapy: Secondary | ICD-10-CM | POA: Diagnosis not present

## 2018-12-24 DIAGNOSIS — I959 Hypotension, unspecified: Secondary | ICD-10-CM | POA: Diagnosis present

## 2018-12-24 DIAGNOSIS — R609 Edema, unspecified: Secondary | ICD-10-CM | POA: Diagnosis not present

## 2018-12-24 DIAGNOSIS — R079 Chest pain, unspecified: Secondary | ICD-10-CM | POA: Diagnosis not present

## 2018-12-24 DIAGNOSIS — J9622 Acute and chronic respiratory failure with hypercapnia: Secondary | ICD-10-CM | POA: Diagnosis present

## 2018-12-24 DIAGNOSIS — I361 Nonrheumatic tricuspid (valve) insufficiency: Secondary | ICD-10-CM | POA: Diagnosis not present

## 2018-12-24 DIAGNOSIS — E872 Acidosis: Secondary | ICD-10-CM | POA: Diagnosis present

## 2018-12-24 DIAGNOSIS — R279 Unspecified lack of coordination: Secondary | ICD-10-CM | POA: Diagnosis not present

## 2018-12-24 DIAGNOSIS — N186 End stage renal disease: Secondary | ICD-10-CM | POA: Diagnosis not present

## 2018-12-24 DIAGNOSIS — Z992 Dependence on renal dialysis: Secondary | ICD-10-CM | POA: Diagnosis not present

## 2018-12-24 DIAGNOSIS — Z4682 Encounter for fitting and adjustment of non-vascular catheter: Secondary | ICD-10-CM | POA: Diagnosis not present

## 2018-12-24 DIAGNOSIS — R0600 Dyspnea, unspecified: Secondary | ICD-10-CM | POA: Diagnosis not present

## 2018-12-24 DIAGNOSIS — Z743 Need for continuous supervision: Secondary | ICD-10-CM | POA: Diagnosis not present

## 2018-12-24 DIAGNOSIS — K8010 Calculus of gallbladder with chronic cholecystitis without obstruction: Secondary | ICD-10-CM | POA: Diagnosis present

## 2018-12-24 DIAGNOSIS — E1165 Type 2 diabetes mellitus with hyperglycemia: Secondary | ICD-10-CM | POA: Diagnosis present

## 2018-12-24 DIAGNOSIS — Z6841 Body Mass Index (BMI) 40.0 and over, adult: Secondary | ICD-10-CM | POA: Diagnosis not present

## 2018-12-24 DIAGNOSIS — E119 Type 2 diabetes mellitus without complications: Secondary | ICD-10-CM | POA: Diagnosis present

## 2018-12-24 DIAGNOSIS — I132 Hypertensive heart and chronic kidney disease with heart failure and with stage 5 chronic kidney disease, or end stage renal disease: Secondary | ICD-10-CM | POA: Diagnosis present

## 2018-12-24 DIAGNOSIS — J9620 Acute and chronic respiratory failure, unspecified whether with hypoxia or hypercapnia: Secondary | ICD-10-CM | POA: Diagnosis present

## 2018-12-24 DIAGNOSIS — E8809 Other disorders of plasma-protein metabolism, not elsewhere classified: Secondary | ICD-10-CM | POA: Diagnosis not present

## 2018-12-24 DIAGNOSIS — J441 Chronic obstructive pulmonary disease with (acute) exacerbation: Secondary | ICD-10-CM | POA: Diagnosis present

## 2018-12-24 DIAGNOSIS — J9621 Acute and chronic respiratory failure with hypoxia: Secondary | ICD-10-CM | POA: Diagnosis present

## 2018-12-24 DIAGNOSIS — Z2821 Immunization not carried out because of patient refusal: Secondary | ICD-10-CM | POA: Diagnosis not present

## 2018-12-24 DIAGNOSIS — R1312 Dysphagia, oropharyngeal phase: Secondary | ICD-10-CM | POA: Diagnosis present

## 2018-12-24 DIAGNOSIS — I509 Heart failure, unspecified: Secondary | ICD-10-CM | POA: Diagnosis not present

## 2018-12-24 DIAGNOSIS — R0602 Shortness of breath: Secondary | ICD-10-CM | POA: Diagnosis not present

## 2018-12-24 DIAGNOSIS — Z736 Limitation of activities due to disability: Secondary | ICD-10-CM | POA: Diagnosis present

## 2018-12-24 DIAGNOSIS — M6281 Muscle weakness (generalized): Secondary | ICD-10-CM | POA: Diagnosis present

## 2018-12-24 DIAGNOSIS — E8770 Fluid overload, unspecified: Secondary | ICD-10-CM | POA: Diagnosis not present

## 2018-12-24 DIAGNOSIS — E213 Hyperparathyroidism, unspecified: Secondary | ICD-10-CM | POA: Diagnosis present

## 2018-12-24 DIAGNOSIS — R2689 Other abnormalities of gait and mobility: Secondary | ICD-10-CM | POA: Diagnosis present

## 2018-12-24 DIAGNOSIS — Z430 Encounter for attention to tracheostomy: Secondary | ICD-10-CM | POA: Diagnosis not present

## 2018-12-24 DIAGNOSIS — D649 Anemia, unspecified: Secondary | ICD-10-CM | POA: Diagnosis not present

## 2018-12-24 DIAGNOSIS — J9500 Unspecified tracheostomy complication: Secondary | ICD-10-CM | POA: Diagnosis present

## 2018-12-24 DIAGNOSIS — D631 Anemia in chronic kidney disease: Secondary | ICD-10-CM | POA: Diagnosis present

## 2018-12-24 DIAGNOSIS — E1122 Type 2 diabetes mellitus with diabetic chronic kidney disease: Secondary | ICD-10-CM | POA: Diagnosis present

## 2018-12-24 DIAGNOSIS — K819 Cholecystitis, unspecified: Secondary | ICD-10-CM | POA: Diagnosis not present

## 2018-12-24 DIAGNOSIS — I4891 Unspecified atrial fibrillation: Secondary | ICD-10-CM | POA: Diagnosis not present

## 2018-12-24 DIAGNOSIS — E662 Morbid (severe) obesity with alveolar hypoventilation: Secondary | ICD-10-CM | POA: Diagnosis present

## 2018-12-24 DIAGNOSIS — J9690 Respiratory failure, unspecified, unspecified whether with hypoxia or hypercapnia: Secondary | ICD-10-CM | POA: Diagnosis not present

## 2019-01-03 DIAGNOSIS — Z9181 History of falling: Secondary | ICD-10-CM | POA: Diagnosis not present

## 2019-01-03 DIAGNOSIS — R0602 Shortness of breath: Secondary | ICD-10-CM | POA: Diagnosis not present

## 2019-01-03 DIAGNOSIS — Z794 Long term (current) use of insulin: Secondary | ICD-10-CM | POA: Diagnosis not present

## 2019-01-03 DIAGNOSIS — N186 End stage renal disease: Secondary | ICD-10-CM | POA: Diagnosis present

## 2019-01-03 DIAGNOSIS — J9621 Acute and chronic respiratory failure with hypoxia: Secondary | ICD-10-CM | POA: Diagnosis present

## 2019-01-03 DIAGNOSIS — R1310 Dysphagia, unspecified: Secondary | ICD-10-CM | POA: Diagnosis present

## 2019-01-03 DIAGNOSIS — I10 Essential (primary) hypertension: Secondary | ICD-10-CM | POA: Diagnosis not present

## 2019-01-03 DIAGNOSIS — E119 Type 2 diabetes mellitus without complications: Secondary | ICD-10-CM | POA: Diagnosis present

## 2019-01-03 DIAGNOSIS — J9611 Chronic respiratory failure with hypoxia: Secondary | ICD-10-CM | POA: Diagnosis not present

## 2019-01-03 DIAGNOSIS — Z992 Dependence on renal dialysis: Secondary | ICD-10-CM | POA: Diagnosis not present

## 2019-01-03 DIAGNOSIS — R279 Unspecified lack of coordination: Secondary | ICD-10-CM | POA: Diagnosis not present

## 2019-01-03 DIAGNOSIS — R2689 Other abnormalities of gait and mobility: Secondary | ICD-10-CM | POA: Diagnosis present

## 2019-01-03 DIAGNOSIS — E876 Hypokalemia: Secondary | ICD-10-CM | POA: Diagnosis present

## 2019-01-03 DIAGNOSIS — J189 Pneumonia, unspecified organism: Secondary | ICD-10-CM | POA: Diagnosis present

## 2019-01-03 DIAGNOSIS — I5033 Acute on chronic diastolic (congestive) heart failure: Secondary | ICD-10-CM | POA: Diagnosis not present

## 2019-01-03 DIAGNOSIS — I2510 Atherosclerotic heart disease of native coronary artery without angina pectoris: Secondary | ICD-10-CM | POA: Diagnosis present

## 2019-01-03 DIAGNOSIS — Z8674 Personal history of sudden cardiac arrest: Secondary | ICD-10-CM | POA: Diagnosis not present

## 2019-01-03 DIAGNOSIS — R0989 Other specified symptoms and signs involving the circulatory and respiratory systems: Secondary | ICD-10-CM | POA: Diagnosis not present

## 2019-01-03 DIAGNOSIS — J449 Chronic obstructive pulmonary disease, unspecified: Secondary | ICD-10-CM | POA: Diagnosis present

## 2019-01-03 DIAGNOSIS — E669 Obesity, unspecified: Secondary | ICD-10-CM | POA: Diagnosis present

## 2019-01-03 DIAGNOSIS — I5031 Acute diastolic (congestive) heart failure: Secondary | ICD-10-CM | POA: Diagnosis not present

## 2019-01-03 DIAGNOSIS — D631 Anemia in chronic kidney disease: Secondary | ICD-10-CM | POA: Diagnosis present

## 2019-01-03 DIAGNOSIS — Z743 Need for continuous supervision: Secondary | ICD-10-CM | POA: Diagnosis not present

## 2019-01-03 DIAGNOSIS — I509 Heart failure, unspecified: Secondary | ICD-10-CM | POA: Diagnosis present

## 2019-01-03 DIAGNOSIS — G4733 Obstructive sleep apnea (adult) (pediatric): Secondary | ICD-10-CM | POA: Diagnosis present

## 2019-01-03 DIAGNOSIS — Z931 Gastrostomy status: Secondary | ICD-10-CM | POA: Diagnosis not present

## 2019-01-03 DIAGNOSIS — R0600 Dyspnea, unspecified: Secondary | ICD-10-CM | POA: Diagnosis not present

## 2019-01-03 DIAGNOSIS — I132 Hypertensive heart and chronic kidney disease with heart failure and with stage 5 chronic kidney disease, or end stage renal disease: Secondary | ICD-10-CM | POA: Diagnosis present

## 2019-01-03 DIAGNOSIS — R7989 Other specified abnormal findings of blood chemistry: Secondary | ICD-10-CM | POA: Diagnosis not present

## 2019-01-03 DIAGNOSIS — R1312 Dysphagia, oropharyngeal phase: Secondary | ICD-10-CM | POA: Diagnosis present

## 2019-01-03 DIAGNOSIS — Z13220 Encounter for screening for lipoid disorders: Secondary | ICD-10-CM | POA: Diagnosis not present

## 2019-01-03 DIAGNOSIS — I953 Hypotension of hemodialysis: Secondary | ICD-10-CM | POA: Diagnosis not present

## 2019-01-03 DIAGNOSIS — Z736 Limitation of activities due to disability: Secondary | ICD-10-CM | POA: Diagnosis present

## 2019-01-03 DIAGNOSIS — I248 Other forms of acute ischemic heart disease: Secondary | ICD-10-CM | POA: Diagnosis present

## 2019-01-03 DIAGNOSIS — Z87891 Personal history of nicotine dependence: Secondary | ICD-10-CM | POA: Diagnosis not present

## 2019-01-03 DIAGNOSIS — Z430 Encounter for attention to tracheostomy: Secondary | ICD-10-CM | POA: Diagnosis not present

## 2019-01-03 DIAGNOSIS — Z452 Encounter for adjustment and management of vascular access device: Secondary | ICD-10-CM | POA: Diagnosis not present

## 2019-01-03 DIAGNOSIS — Z6841 Body Mass Index (BMI) 40.0 and over, adult: Secondary | ICD-10-CM | POA: Diagnosis not present

## 2019-01-03 DIAGNOSIS — F419 Anxiety disorder, unspecified: Secondary | ICD-10-CM | POA: Diagnosis present

## 2019-01-03 DIAGNOSIS — D72829 Elevated white blood cell count, unspecified: Secondary | ICD-10-CM | POA: Diagnosis not present

## 2019-01-03 DIAGNOSIS — N289 Disorder of kidney and ureter, unspecified: Secondary | ICD-10-CM | POA: Diagnosis present

## 2019-01-03 DIAGNOSIS — E785 Hyperlipidemia, unspecified: Secondary | ICD-10-CM | POA: Diagnosis not present

## 2019-01-03 DIAGNOSIS — Z888 Allergy status to other drugs, medicaments and biological substances status: Secondary | ICD-10-CM | POA: Diagnosis not present

## 2019-01-03 DIAGNOSIS — E1122 Type 2 diabetes mellitus with diabetic chronic kidney disease: Secondary | ICD-10-CM | POA: Diagnosis present

## 2019-01-03 DIAGNOSIS — I959 Hypotension, unspecified: Secondary | ICD-10-CM | POA: Diagnosis not present

## 2019-01-03 DIAGNOSIS — J9620 Acute and chronic respiratory failure, unspecified whether with hypoxia or hypercapnia: Secondary | ICD-10-CM | POA: Diagnosis present

## 2019-01-03 DIAGNOSIS — E662 Morbid (severe) obesity with alveolar hypoventilation: Secondary | ICD-10-CM | POA: Diagnosis present

## 2019-01-03 DIAGNOSIS — M6281 Muscle weakness (generalized): Secondary | ICD-10-CM | POA: Diagnosis present

## 2019-01-03 DIAGNOSIS — D638 Anemia in other chronic diseases classified elsewhere: Secondary | ICD-10-CM | POA: Diagnosis not present

## 2019-01-03 DIAGNOSIS — Z930 Tracheostomy status: Secondary | ICD-10-CM | POA: Diagnosis not present

## 2019-01-03 DIAGNOSIS — J9600 Acute respiratory failure, unspecified whether with hypoxia or hypercapnia: Secondary | ICD-10-CM | POA: Diagnosis not present

## 2019-01-04 DIAGNOSIS — I2510 Atherosclerotic heart disease of native coronary artery without angina pectoris: Secondary | ICD-10-CM | POA: Diagnosis not present

## 2019-01-04 DIAGNOSIS — I959 Hypotension, unspecified: Secondary | ICD-10-CM | POA: Diagnosis not present

## 2019-01-04 DIAGNOSIS — I10 Essential (primary) hypertension: Secondary | ICD-10-CM | POA: Diagnosis not present

## 2019-01-04 DIAGNOSIS — J189 Pneumonia, unspecified organism: Secondary | ICD-10-CM | POA: Diagnosis not present

## 2019-01-04 DIAGNOSIS — I509 Heart failure, unspecified: Secondary | ICD-10-CM | POA: Diagnosis not present

## 2019-01-04 DIAGNOSIS — N186 End stage renal disease: Secondary | ICD-10-CM | POA: Diagnosis not present

## 2019-01-04 DIAGNOSIS — E785 Hyperlipidemia, unspecified: Secondary | ICD-10-CM | POA: Diagnosis not present

## 2019-01-04 DIAGNOSIS — E662 Morbid (severe) obesity with alveolar hypoventilation: Secondary | ICD-10-CM | POA: Diagnosis not present

## 2019-01-04 DIAGNOSIS — E119 Type 2 diabetes mellitus without complications: Secondary | ICD-10-CM | POA: Diagnosis not present

## 2019-01-04 DIAGNOSIS — D638 Anemia in other chronic diseases classified elsewhere: Secondary | ICD-10-CM | POA: Diagnosis not present

## 2019-01-04 DIAGNOSIS — Z992 Dependence on renal dialysis: Secondary | ICD-10-CM | POA: Diagnosis not present

## 2019-01-07 DIAGNOSIS — E662 Morbid (severe) obesity with alveolar hypoventilation: Secondary | ICD-10-CM | POA: Diagnosis not present

## 2019-01-07 DIAGNOSIS — N186 End stage renal disease: Secondary | ICD-10-CM | POA: Diagnosis not present

## 2019-01-07 DIAGNOSIS — J9611 Chronic respiratory failure with hypoxia: Secondary | ICD-10-CM | POA: Diagnosis not present

## 2019-01-07 DIAGNOSIS — Z992 Dependence on renal dialysis: Secondary | ICD-10-CM | POA: Diagnosis not present

## 2019-01-07 DIAGNOSIS — D638 Anemia in other chronic diseases classified elsewhere: Secondary | ICD-10-CM | POA: Diagnosis not present

## 2019-01-07 DIAGNOSIS — I5031 Acute diastolic (congestive) heart failure: Secondary | ICD-10-CM | POA: Diagnosis not present

## 2019-01-07 DIAGNOSIS — E785 Hyperlipidemia, unspecified: Secondary | ICD-10-CM | POA: Diagnosis not present

## 2019-01-07 DIAGNOSIS — I509 Heart failure, unspecified: Secondary | ICD-10-CM | POA: Diagnosis not present

## 2019-01-07 DIAGNOSIS — F419 Anxiety disorder, unspecified: Secondary | ICD-10-CM | POA: Diagnosis not present

## 2019-01-07 DIAGNOSIS — I959 Hypotension, unspecified: Secondary | ICD-10-CM | POA: Diagnosis not present

## 2019-01-07 DIAGNOSIS — I2510 Atherosclerotic heart disease of native coronary artery without angina pectoris: Secondary | ICD-10-CM | POA: Diagnosis not present

## 2019-01-07 DIAGNOSIS — I10 Essential (primary) hypertension: Secondary | ICD-10-CM | POA: Diagnosis not present

## 2019-01-07 DIAGNOSIS — E119 Type 2 diabetes mellitus without complications: Secondary | ICD-10-CM | POA: Diagnosis not present

## 2019-01-08 DIAGNOSIS — J9690 Respiratory failure, unspecified, unspecified whether with hypoxia or hypercapnia: Secondary | ICD-10-CM | POA: Diagnosis not present

## 2019-01-08 DIAGNOSIS — I252 Old myocardial infarction: Secondary | ICD-10-CM | POA: Diagnosis not present

## 2019-01-08 DIAGNOSIS — Z794 Long term (current) use of insulin: Secondary | ICD-10-CM | POA: Diagnosis not present

## 2019-01-08 DIAGNOSIS — I5033 Acute on chronic diastolic (congestive) heart failure: Secondary | ICD-10-CM | POA: Diagnosis not present

## 2019-01-08 DIAGNOSIS — E669 Obesity, unspecified: Secondary | ICD-10-CM | POA: Diagnosis present

## 2019-01-08 DIAGNOSIS — R7989 Other specified abnormal findings of blood chemistry: Secondary | ICD-10-CM | POA: Diagnosis not present

## 2019-01-08 DIAGNOSIS — E1122 Type 2 diabetes mellitus with diabetic chronic kidney disease: Secondary | ICD-10-CM | POA: Diagnosis present

## 2019-01-08 DIAGNOSIS — R0602 Shortness of breath: Secondary | ICD-10-CM | POA: Diagnosis not present

## 2019-01-08 DIAGNOSIS — R2689 Other abnormalities of gait and mobility: Secondary | ICD-10-CM | POA: Diagnosis not present

## 2019-01-08 DIAGNOSIS — I5031 Acute diastolic (congestive) heart failure: Secondary | ICD-10-CM | POA: Diagnosis not present

## 2019-01-08 DIAGNOSIS — R1310 Dysphagia, unspecified: Secondary | ICD-10-CM | POA: Diagnosis present

## 2019-01-08 DIAGNOSIS — E119 Type 2 diabetes mellitus without complications: Secondary | ICD-10-CM | POA: Diagnosis not present

## 2019-01-08 DIAGNOSIS — Z431 Encounter for attention to gastrostomy: Secondary | ICD-10-CM | POA: Diagnosis not present

## 2019-01-08 DIAGNOSIS — Z992 Dependence on renal dialysis: Secondary | ICD-10-CM | POA: Diagnosis not present

## 2019-01-08 DIAGNOSIS — Z22322 Carrier or suspected carrier of Methicillin resistant Staphylococcus aureus: Secondary | ICD-10-CM | POA: Diagnosis not present

## 2019-01-08 DIAGNOSIS — E662 Morbid (severe) obesity with alveolar hypoventilation: Secondary | ICD-10-CM | POA: Diagnosis not present

## 2019-01-08 DIAGNOSIS — Z9181 History of falling: Secondary | ICD-10-CM | POA: Diagnosis not present

## 2019-01-08 DIAGNOSIS — J9621 Acute and chronic respiratory failure with hypoxia: Secondary | ICD-10-CM | POA: Diagnosis present

## 2019-01-08 DIAGNOSIS — Z8674 Personal history of sudden cardiac arrest: Secondary | ICD-10-CM | POA: Diagnosis not present

## 2019-01-08 DIAGNOSIS — E8770 Fluid overload, unspecified: Secondary | ICD-10-CM | POA: Diagnosis not present

## 2019-01-08 DIAGNOSIS — I509 Heart failure, unspecified: Secondary | ICD-10-CM | POA: Diagnosis not present

## 2019-01-08 DIAGNOSIS — R279 Unspecified lack of coordination: Secondary | ICD-10-CM | POA: Diagnosis not present

## 2019-01-08 DIAGNOSIS — D72829 Elevated white blood cell count, unspecified: Secondary | ICD-10-CM | POA: Diagnosis not present

## 2019-01-08 DIAGNOSIS — D638 Anemia in other chronic diseases classified elsewhere: Secondary | ICD-10-CM | POA: Diagnosis not present

## 2019-01-08 DIAGNOSIS — Z430 Encounter for attention to tracheostomy: Secondary | ICD-10-CM | POA: Diagnosis not present

## 2019-01-08 DIAGNOSIS — J449 Chronic obstructive pulmonary disease, unspecified: Secondary | ICD-10-CM | POA: Diagnosis not present

## 2019-01-08 DIAGNOSIS — Z743 Need for continuous supervision: Secondary | ICD-10-CM | POA: Diagnosis not present

## 2019-01-08 DIAGNOSIS — Z931 Gastrostomy status: Secondary | ICD-10-CM | POA: Diagnosis not present

## 2019-01-08 DIAGNOSIS — N186 End stage renal disease: Secondary | ICD-10-CM | POA: Diagnosis present

## 2019-01-08 DIAGNOSIS — R0989 Other specified symptoms and signs involving the circulatory and respiratory systems: Secondary | ICD-10-CM | POA: Diagnosis not present

## 2019-01-08 DIAGNOSIS — Z87891 Personal history of nicotine dependence: Secondary | ICD-10-CM | POA: Diagnosis not present

## 2019-01-08 DIAGNOSIS — R0600 Dyspnea, unspecified: Secondary | ICD-10-CM | POA: Diagnosis not present

## 2019-01-08 DIAGNOSIS — M6281 Muscle weakness (generalized): Secondary | ICD-10-CM | POA: Diagnosis not present

## 2019-01-08 DIAGNOSIS — Z736 Limitation of activities due to disability: Secondary | ICD-10-CM | POA: Diagnosis not present

## 2019-01-08 DIAGNOSIS — J9601 Acute respiratory failure with hypoxia: Secondary | ICD-10-CM | POA: Diagnosis not present

## 2019-01-08 DIAGNOSIS — J189 Pneumonia, unspecified organism: Secondary | ICD-10-CM | POA: Diagnosis not present

## 2019-01-08 DIAGNOSIS — G4733 Obstructive sleep apnea (adult) (pediatric): Secondary | ICD-10-CM | POA: Diagnosis present

## 2019-01-08 DIAGNOSIS — J9620 Acute and chronic respiratory failure, unspecified whether with hypoxia or hypercapnia: Secondary | ICD-10-CM | POA: Diagnosis not present

## 2019-01-08 DIAGNOSIS — D631 Anemia in chronic kidney disease: Secondary | ICD-10-CM | POA: Diagnosis present

## 2019-01-08 DIAGNOSIS — Z6841 Body Mass Index (BMI) 40.0 and over, adult: Secondary | ICD-10-CM | POA: Diagnosis not present

## 2019-01-08 DIAGNOSIS — Z452 Encounter for adjustment and management of vascular access device: Secondary | ICD-10-CM | POA: Diagnosis not present

## 2019-01-08 DIAGNOSIS — I132 Hypertensive heart and chronic kidney disease with heart failure and with stage 5 chronic kidney disease, or end stage renal disease: Secondary | ICD-10-CM | POA: Diagnosis not present

## 2019-01-08 DIAGNOSIS — K819 Cholecystitis, unspecified: Secondary | ICD-10-CM | POA: Diagnosis not present

## 2019-01-08 DIAGNOSIS — Z888 Allergy status to other drugs, medicaments and biological substances status: Secondary | ICD-10-CM | POA: Diagnosis not present

## 2019-01-08 DIAGNOSIS — F419 Anxiety disorder, unspecified: Secondary | ICD-10-CM | POA: Diagnosis present

## 2019-01-08 DIAGNOSIS — J9600 Acute respiratory failure, unspecified whether with hypoxia or hypercapnia: Secondary | ICD-10-CM | POA: Diagnosis not present

## 2019-01-08 DIAGNOSIS — N289 Disorder of kidney and ureter, unspecified: Secondary | ICD-10-CM | POA: Diagnosis present

## 2019-01-08 DIAGNOSIS — I2510 Atherosclerotic heart disease of native coronary artery without angina pectoris: Secondary | ICD-10-CM | POA: Diagnosis present

## 2019-01-08 DIAGNOSIS — E876 Hypokalemia: Secondary | ICD-10-CM | POA: Diagnosis present

## 2019-01-08 DIAGNOSIS — Z930 Tracheostomy status: Secondary | ICD-10-CM | POA: Diagnosis not present

## 2019-01-08 DIAGNOSIS — I953 Hypotension of hemodialysis: Secondary | ICD-10-CM | POA: Diagnosis not present

## 2019-01-08 DIAGNOSIS — I248 Other forms of acute ischemic heart disease: Secondary | ICD-10-CM | POA: Diagnosis not present

## 2019-01-08 DIAGNOSIS — R0902 Hypoxemia: Secondary | ICD-10-CM | POA: Diagnosis not present

## 2019-01-08 DIAGNOSIS — Z9889 Other specified postprocedural states: Secondary | ICD-10-CM | POA: Diagnosis not present

## 2019-01-08 DIAGNOSIS — R1312 Dysphagia, oropharyngeal phase: Secondary | ICD-10-CM | POA: Diagnosis not present

## 2019-01-11 DIAGNOSIS — I071 Rheumatic tricuspid insufficiency: Secondary | ICD-10-CM | POA: Diagnosis not present

## 2019-01-11 DIAGNOSIS — J811 Chronic pulmonary edema: Secondary | ICD-10-CM | POA: Diagnosis not present

## 2019-01-11 DIAGNOSIS — D72829 Elevated white blood cell count, unspecified: Secondary | ICD-10-CM | POA: Diagnosis present

## 2019-01-11 DIAGNOSIS — J9611 Chronic respiratory failure with hypoxia: Secondary | ICD-10-CM | POA: Diagnosis not present

## 2019-01-11 DIAGNOSIS — I447 Left bundle-branch block, unspecified: Secondary | ICD-10-CM | POA: Diagnosis not present

## 2019-01-11 DIAGNOSIS — Z743 Need for continuous supervision: Secondary | ICD-10-CM | POA: Diagnosis not present

## 2019-01-11 DIAGNOSIS — E1122 Type 2 diabetes mellitus with diabetic chronic kidney disease: Secondary | ICD-10-CM | POA: Diagnosis present

## 2019-01-11 DIAGNOSIS — I959 Hypotension, unspecified: Secondary | ICD-10-CM | POA: Diagnosis not present

## 2019-01-11 DIAGNOSIS — R918 Other nonspecific abnormal finding of lung field: Secondary | ICD-10-CM | POA: Diagnosis not present

## 2019-01-11 DIAGNOSIS — I10 Essential (primary) hypertension: Secondary | ICD-10-CM | POA: Diagnosis not present

## 2019-01-11 DIAGNOSIS — Z931 Gastrostomy status: Secondary | ICD-10-CM | POA: Diagnosis not present

## 2019-01-11 DIAGNOSIS — F419 Anxiety disorder, unspecified: Secondary | ICD-10-CM | POA: Diagnosis not present

## 2019-01-11 DIAGNOSIS — E8770 Fluid overload, unspecified: Secondary | ICD-10-CM | POA: Diagnosis not present

## 2019-01-11 DIAGNOSIS — S31000A Unspecified open wound of lower back and pelvis without penetration into retroperitoneum, initial encounter: Secondary | ICD-10-CM | POA: Diagnosis present

## 2019-01-11 DIAGNOSIS — R9431 Abnormal electrocardiogram [ECG] [EKG]: Secondary | ICD-10-CM | POA: Diagnosis not present

## 2019-01-11 DIAGNOSIS — Z6841 Body Mass Index (BMI) 40.0 and over, adult: Secondary | ICD-10-CM | POA: Diagnosis not present

## 2019-01-11 DIAGNOSIS — I444 Left anterior fascicular block: Secondary | ICD-10-CM | POA: Diagnosis not present

## 2019-01-11 DIAGNOSIS — J189 Pneumonia, unspecified organism: Secondary | ICD-10-CM | POA: Diagnosis not present

## 2019-01-11 DIAGNOSIS — Z430 Encounter for attention to tracheostomy: Secondary | ICD-10-CM | POA: Diagnosis not present

## 2019-01-11 DIAGNOSIS — Z87891 Personal history of nicotine dependence: Secondary | ICD-10-CM | POA: Diagnosis not present

## 2019-01-11 DIAGNOSIS — Z9181 History of falling: Secondary | ICD-10-CM | POA: Diagnosis not present

## 2019-01-11 DIAGNOSIS — R0602 Shortness of breath: Secondary | ICD-10-CM | POA: Diagnosis not present

## 2019-01-11 DIAGNOSIS — J9620 Acute and chronic respiratory failure, unspecified whether with hypoxia or hypercapnia: Secondary | ICD-10-CM | POA: Diagnosis not present

## 2019-01-11 DIAGNOSIS — Z22322 Carrier or suspected carrier of Methicillin resistant Staphylococcus aureus: Secondary | ICD-10-CM | POA: Diagnosis not present

## 2019-01-11 DIAGNOSIS — Z431 Encounter for attention to gastrostomy: Secondary | ICD-10-CM | POA: Diagnosis not present

## 2019-01-11 DIAGNOSIS — J810 Acute pulmonary edema: Secondary | ICD-10-CM | POA: Diagnosis not present

## 2019-01-11 DIAGNOSIS — Z930 Tracheostomy status: Secondary | ICD-10-CM | POA: Diagnosis not present

## 2019-01-11 DIAGNOSIS — L600 Ingrowing nail: Secondary | ICD-10-CM | POA: Diagnosis not present

## 2019-01-11 DIAGNOSIS — M6259 Muscle wasting and atrophy, not elsewhere classified, multiple sites: Secondary | ICD-10-CM | POA: Diagnosis not present

## 2019-01-11 DIAGNOSIS — T8249XA Other complication of vascular dialysis catheter, initial encounter: Secondary | ICD-10-CM | POA: Diagnosis not present

## 2019-01-11 DIAGNOSIS — K819 Cholecystitis, unspecified: Secondary | ICD-10-CM | POA: Diagnosis not present

## 2019-01-11 DIAGNOSIS — R601 Generalized edema: Secondary | ICD-10-CM | POA: Diagnosis not present

## 2019-01-11 DIAGNOSIS — T17990A Other foreign object in respiratory tract, part unspecified in causing asphyxiation, initial encounter: Secondary | ICD-10-CM | POA: Diagnosis present

## 2019-01-11 DIAGNOSIS — I2510 Atherosclerotic heart disease of native coronary artery without angina pectoris: Secondary | ICD-10-CM | POA: Diagnosis not present

## 2019-01-11 DIAGNOSIS — E876 Hypokalemia: Secondary | ICD-10-CM | POA: Diagnosis present

## 2019-01-11 DIAGNOSIS — I248 Other forms of acute ischemic heart disease: Secondary | ICD-10-CM | POA: Diagnosis not present

## 2019-01-11 DIAGNOSIS — E119 Type 2 diabetes mellitus without complications: Secondary | ICD-10-CM | POA: Diagnosis not present

## 2019-01-11 DIAGNOSIS — I517 Cardiomegaly: Secondary | ICD-10-CM | POA: Diagnosis not present

## 2019-01-11 DIAGNOSIS — N186 End stage renal disease: Secondary | ICD-10-CM | POA: Diagnosis present

## 2019-01-11 DIAGNOSIS — J9621 Acute and chronic respiratory failure with hypoxia: Secondary | ICD-10-CM | POA: Diagnosis present

## 2019-01-11 DIAGNOSIS — R262 Difficulty in walking, not elsewhere classified: Secondary | ICD-10-CM | POA: Diagnosis not present

## 2019-01-11 DIAGNOSIS — Z91041 Radiographic dye allergy status: Secondary | ICD-10-CM | POA: Diagnosis not present

## 2019-01-11 DIAGNOSIS — Z992 Dependence on renal dialysis: Secondary | ICD-10-CM | POA: Diagnosis not present

## 2019-01-11 DIAGNOSIS — I214 Non-ST elevation (NSTEMI) myocardial infarction: Secondary | ICD-10-CM | POA: Diagnosis not present

## 2019-01-11 DIAGNOSIS — J3802 Paralysis of vocal cords and larynx, bilateral: Secondary | ICD-10-CM | POA: Diagnosis present

## 2019-01-11 DIAGNOSIS — D631 Anemia in chronic kidney disease: Secondary | ICD-10-CM | POA: Diagnosis present

## 2019-01-11 DIAGNOSIS — Z9989 Dependence on other enabling machines and devices: Secondary | ICD-10-CM | POA: Diagnosis not present

## 2019-01-11 DIAGNOSIS — R1312 Dysphagia, oropharyngeal phase: Secondary | ICD-10-CM | POA: Diagnosis not present

## 2019-01-11 DIAGNOSIS — I21A1 Myocardial infarction type 2: Secondary | ICD-10-CM | POA: Diagnosis present

## 2019-01-11 DIAGNOSIS — Z736 Limitation of activities due to disability: Secondary | ICD-10-CM | POA: Diagnosis not present

## 2019-01-11 DIAGNOSIS — E662 Morbid (severe) obesity with alveolar hypoventilation: Secondary | ICD-10-CM | POA: Diagnosis not present

## 2019-01-11 DIAGNOSIS — M6281 Muscle weakness (generalized): Secondary | ICD-10-CM | POA: Diagnosis not present

## 2019-01-11 DIAGNOSIS — Z8674 Personal history of sudden cardiac arrest: Secondary | ICD-10-CM | POA: Diagnosis not present

## 2019-01-11 DIAGNOSIS — E785 Hyperlipidemia, unspecified: Secondary | ICD-10-CM | POA: Diagnosis not present

## 2019-01-11 DIAGNOSIS — D638 Anemia in other chronic diseases classified elsewhere: Secondary | ICD-10-CM | POA: Diagnosis not present

## 2019-01-11 DIAGNOSIS — J9601 Acute respiratory failure with hypoxia: Secondary | ICD-10-CM | POA: Diagnosis not present

## 2019-01-11 DIAGNOSIS — I132 Hypertensive heart and chronic kidney disease with heart failure and with stage 5 chronic kidney disease, or end stage renal disease: Secondary | ICD-10-CM | POA: Diagnosis not present

## 2019-01-11 DIAGNOSIS — Z5189 Encounter for other specified aftercare: Secondary | ICD-10-CM | POA: Diagnosis not present

## 2019-01-11 DIAGNOSIS — R279 Unspecified lack of coordination: Secondary | ICD-10-CM | POA: Diagnosis not present

## 2019-01-11 DIAGNOSIS — R2689 Other abnormalities of gait and mobility: Secondary | ICD-10-CM | POA: Diagnosis not present

## 2019-01-11 DIAGNOSIS — I509 Heart failure, unspecified: Secondary | ICD-10-CM | POA: Diagnosis not present

## 2019-01-11 DIAGNOSIS — R5381 Other malaise: Secondary | ICD-10-CM | POA: Diagnosis not present

## 2019-01-11 DIAGNOSIS — I252 Old myocardial infarction: Secondary | ICD-10-CM | POA: Diagnosis not present

## 2019-01-11 DIAGNOSIS — R000 Tachycardia, unspecified: Secondary | ICD-10-CM | POA: Diagnosis not present

## 2019-01-11 DIAGNOSIS — B351 Tinea unguium: Secondary | ICD-10-CM | POA: Diagnosis not present

## 2019-01-11 DIAGNOSIS — R061 Stridor: Secondary | ICD-10-CM | POA: Diagnosis not present

## 2019-01-11 DIAGNOSIS — Z79899 Other long term (current) drug therapy: Secondary | ICD-10-CM | POA: Diagnosis not present

## 2019-01-11 DIAGNOSIS — G4733 Obstructive sleep apnea (adult) (pediatric): Secondary | ICD-10-CM | POA: Diagnosis present

## 2019-01-11 DIAGNOSIS — J449 Chronic obstructive pulmonary disease, unspecified: Secondary | ICD-10-CM | POA: Diagnosis not present

## 2019-01-11 DIAGNOSIS — I953 Hypotension of hemodialysis: Secondary | ICD-10-CM | POA: Diagnosis not present

## 2019-01-11 DIAGNOSIS — R0600 Dyspnea, unspecified: Secondary | ICD-10-CM | POA: Diagnosis not present

## 2019-01-11 DIAGNOSIS — Z794 Long term (current) use of insulin: Secondary | ICD-10-CM | POA: Diagnosis not present

## 2019-01-11 DIAGNOSIS — J386 Stenosis of larynx: Secondary | ICD-10-CM | POA: Diagnosis not present

## 2019-01-14 DIAGNOSIS — I2510 Atherosclerotic heart disease of native coronary artery without angina pectoris: Secondary | ICD-10-CM | POA: Diagnosis not present

## 2019-01-14 DIAGNOSIS — N186 End stage renal disease: Secondary | ICD-10-CM | POA: Diagnosis not present

## 2019-01-14 DIAGNOSIS — Z992 Dependence on renal dialysis: Secondary | ICD-10-CM | POA: Diagnosis not present

## 2019-01-14 DIAGNOSIS — E662 Morbid (severe) obesity with alveolar hypoventilation: Secondary | ICD-10-CM | POA: Diagnosis not present

## 2019-01-14 DIAGNOSIS — I959 Hypotension, unspecified: Secondary | ICD-10-CM | POA: Diagnosis not present

## 2019-01-14 DIAGNOSIS — M6259 Muscle wasting and atrophy, not elsewhere classified, multiple sites: Secondary | ICD-10-CM | POA: Diagnosis not present

## 2019-01-14 DIAGNOSIS — E785 Hyperlipidemia, unspecified: Secondary | ICD-10-CM | POA: Diagnosis not present

## 2019-01-14 DIAGNOSIS — J449 Chronic obstructive pulmonary disease, unspecified: Secondary | ICD-10-CM | POA: Diagnosis not present

## 2019-01-14 DIAGNOSIS — D638 Anemia in other chronic diseases classified elsewhere: Secondary | ICD-10-CM | POA: Diagnosis not present

## 2019-01-14 DIAGNOSIS — I509 Heart failure, unspecified: Secondary | ICD-10-CM | POA: Diagnosis not present

## 2019-01-14 DIAGNOSIS — Z5189 Encounter for other specified aftercare: Secondary | ICD-10-CM | POA: Diagnosis not present

## 2019-01-14 DIAGNOSIS — F419 Anxiety disorder, unspecified: Secondary | ICD-10-CM | POA: Diagnosis not present

## 2019-01-14 DIAGNOSIS — R5381 Other malaise: Secondary | ICD-10-CM | POA: Diagnosis not present

## 2019-01-14 DIAGNOSIS — E119 Type 2 diabetes mellitus without complications: Secondary | ICD-10-CM | POA: Diagnosis not present

## 2019-01-14 DIAGNOSIS — R601 Generalized edema: Secondary | ICD-10-CM | POA: Diagnosis not present

## 2019-01-16 DIAGNOSIS — E662 Morbid (severe) obesity with alveolar hypoventilation: Secondary | ICD-10-CM | POA: Diagnosis not present

## 2019-01-16 DIAGNOSIS — E785 Hyperlipidemia, unspecified: Secondary | ICD-10-CM | POA: Diagnosis not present

## 2019-01-16 DIAGNOSIS — Z992 Dependence on renal dialysis: Secondary | ICD-10-CM | POA: Diagnosis not present

## 2019-01-16 DIAGNOSIS — I517 Cardiomegaly: Secondary | ICD-10-CM | POA: Diagnosis not present

## 2019-01-16 DIAGNOSIS — J9620 Acute and chronic respiratory failure, unspecified whether with hypoxia or hypercapnia: Secondary | ICD-10-CM | POA: Diagnosis not present

## 2019-01-16 DIAGNOSIS — I509 Heart failure, unspecified: Secondary | ICD-10-CM | POA: Diagnosis not present

## 2019-01-16 DIAGNOSIS — J449 Chronic obstructive pulmonary disease, unspecified: Secondary | ICD-10-CM | POA: Diagnosis not present

## 2019-01-16 DIAGNOSIS — R0602 Shortness of breath: Secondary | ICD-10-CM | POA: Diagnosis not present

## 2019-01-16 DIAGNOSIS — N186 End stage renal disease: Secondary | ICD-10-CM | POA: Diagnosis not present

## 2019-01-16 DIAGNOSIS — I2510 Atherosclerotic heart disease of native coronary artery without angina pectoris: Secondary | ICD-10-CM | POA: Diagnosis not present

## 2019-01-16 DIAGNOSIS — D638 Anemia in other chronic diseases classified elsewhere: Secondary | ICD-10-CM | POA: Diagnosis not present

## 2019-01-16 DIAGNOSIS — T8249XA Other complication of vascular dialysis catheter, initial encounter: Secondary | ICD-10-CM | POA: Diagnosis not present

## 2019-01-16 DIAGNOSIS — I10 Essential (primary) hypertension: Secondary | ICD-10-CM | POA: Diagnosis not present

## 2019-01-16 DIAGNOSIS — E119 Type 2 diabetes mellitus without complications: Secondary | ICD-10-CM | POA: Diagnosis not present

## 2019-01-16 DIAGNOSIS — Z930 Tracheostomy status: Secondary | ICD-10-CM | POA: Diagnosis not present

## 2019-01-16 DIAGNOSIS — I959 Hypotension, unspecified: Secondary | ICD-10-CM | POA: Diagnosis not present

## 2019-01-19 DIAGNOSIS — Z992 Dependence on renal dialysis: Secondary | ICD-10-CM | POA: Diagnosis not present

## 2019-01-19 DIAGNOSIS — N186 End stage renal disease: Secondary | ICD-10-CM | POA: Diagnosis not present

## 2019-01-21 DIAGNOSIS — F419 Anxiety disorder, unspecified: Secondary | ICD-10-CM | POA: Diagnosis present

## 2019-01-21 DIAGNOSIS — E876 Hypokalemia: Secondary | ICD-10-CM | POA: Diagnosis present

## 2019-01-21 DIAGNOSIS — Z9181 History of falling: Secondary | ICD-10-CM | POA: Diagnosis not present

## 2019-01-21 DIAGNOSIS — R1312 Dysphagia, oropharyngeal phase: Secondary | ICD-10-CM | POA: Diagnosis present

## 2019-01-21 DIAGNOSIS — J386 Stenosis of larynx: Secondary | ICD-10-CM | POA: Diagnosis not present

## 2019-01-21 DIAGNOSIS — D72829 Elevated white blood cell count, unspecified: Secondary | ICD-10-CM | POA: Diagnosis not present

## 2019-01-21 DIAGNOSIS — I214 Non-ST elevation (NSTEMI) myocardial infarction: Secondary | ICD-10-CM | POA: Diagnosis not present

## 2019-01-21 DIAGNOSIS — E662 Morbid (severe) obesity with alveolar hypoventilation: Secondary | ICD-10-CM | POA: Diagnosis present

## 2019-01-21 DIAGNOSIS — S31000A Unspecified open wound of lower back and pelvis without penetration into retroperitoneum, initial encounter: Secondary | ICD-10-CM | POA: Diagnosis present

## 2019-01-21 DIAGNOSIS — J449 Chronic obstructive pulmonary disease, unspecified: Secondary | ICD-10-CM | POA: Diagnosis present

## 2019-01-21 DIAGNOSIS — Z8674 Personal history of sudden cardiac arrest: Secondary | ICD-10-CM | POA: Diagnosis not present

## 2019-01-21 DIAGNOSIS — R278 Other lack of coordination: Secondary | ICD-10-CM | POA: Diagnosis not present

## 2019-01-21 DIAGNOSIS — I509 Heart failure, unspecified: Secondary | ICD-10-CM | POA: Diagnosis present

## 2019-01-21 DIAGNOSIS — R918 Other nonspecific abnormal finding of lung field: Secondary | ICD-10-CM | POA: Diagnosis not present

## 2019-01-21 DIAGNOSIS — D631 Anemia in chronic kidney disease: Secondary | ICD-10-CM | POA: Diagnosis not present

## 2019-01-21 DIAGNOSIS — N186 End stage renal disease: Secondary | ICD-10-CM | POA: Diagnosis not present

## 2019-01-21 DIAGNOSIS — J441 Chronic obstructive pulmonary disease with (acute) exacerbation: Secondary | ICD-10-CM | POA: Diagnosis not present

## 2019-01-21 DIAGNOSIS — Z91041 Radiographic dye allergy status: Secondary | ICD-10-CM | POA: Diagnosis not present

## 2019-01-21 DIAGNOSIS — Z87891 Personal history of nicotine dependence: Secondary | ICD-10-CM | POA: Diagnosis not present

## 2019-01-21 DIAGNOSIS — Z736 Limitation of activities due to disability: Secondary | ICD-10-CM | POA: Diagnosis present

## 2019-01-21 DIAGNOSIS — I10 Essential (primary) hypertension: Secondary | ICD-10-CM | POA: Diagnosis not present

## 2019-01-21 DIAGNOSIS — E1122 Type 2 diabetes mellitus with diabetic chronic kidney disease: Secondary | ICD-10-CM | POA: Diagnosis not present

## 2019-01-21 DIAGNOSIS — T17990A Other foreign object in respiratory tract, part unspecified in causing asphyxiation, initial encounter: Secondary | ICD-10-CM | POA: Diagnosis present

## 2019-01-21 DIAGNOSIS — I444 Left anterior fascicular block: Secondary | ICD-10-CM | POA: Diagnosis not present

## 2019-01-21 DIAGNOSIS — Z931 Gastrostomy status: Secondary | ICD-10-CM | POA: Diagnosis not present

## 2019-01-21 DIAGNOSIS — Z794 Long term (current) use of insulin: Secondary | ICD-10-CM | POA: Diagnosis not present

## 2019-01-21 DIAGNOSIS — E119 Type 2 diabetes mellitus without complications: Secondary | ICD-10-CM | POA: Diagnosis not present

## 2019-01-21 DIAGNOSIS — Z430 Encounter for attention to tracheostomy: Secondary | ICD-10-CM | POA: Diagnosis not present

## 2019-01-21 DIAGNOSIS — Z79899 Other long term (current) drug therapy: Secondary | ICD-10-CM | POA: Diagnosis not present

## 2019-01-21 DIAGNOSIS — J9611 Chronic respiratory failure with hypoxia: Secondary | ICD-10-CM | POA: Diagnosis not present

## 2019-01-21 DIAGNOSIS — R000 Tachycardia, unspecified: Secondary | ICD-10-CM | POA: Diagnosis not present

## 2019-01-21 DIAGNOSIS — Z9989 Dependence on other enabling machines and devices: Secondary | ICD-10-CM | POA: Diagnosis not present

## 2019-01-21 DIAGNOSIS — R9431 Abnormal electrocardiogram [ECG] [EKG]: Secondary | ICD-10-CM | POA: Diagnosis not present

## 2019-01-21 DIAGNOSIS — Z992 Dependence on renal dialysis: Secondary | ICD-10-CM | POA: Diagnosis not present

## 2019-01-21 DIAGNOSIS — M6281 Muscle weakness (generalized): Secondary | ICD-10-CM | POA: Diagnosis present

## 2019-01-21 DIAGNOSIS — J9620 Acute and chronic respiratory failure, unspecified whether with hypoxia or hypercapnia: Secondary | ICD-10-CM | POA: Diagnosis not present

## 2019-01-21 DIAGNOSIS — Z930 Tracheostomy status: Secondary | ICD-10-CM | POA: Diagnosis not present

## 2019-01-21 DIAGNOSIS — I447 Left bundle-branch block, unspecified: Secondary | ICD-10-CM | POA: Diagnosis not present

## 2019-01-21 DIAGNOSIS — J811 Chronic pulmonary edema: Secondary | ICD-10-CM | POA: Diagnosis not present

## 2019-01-21 DIAGNOSIS — J3802 Paralysis of vocal cords and larynx, bilateral: Secondary | ICD-10-CM | POA: Diagnosis present

## 2019-01-21 DIAGNOSIS — I2510 Atherosclerotic heart disease of native coronary artery without angina pectoris: Secondary | ICD-10-CM | POA: Diagnosis present

## 2019-01-21 DIAGNOSIS — K819 Cholecystitis, unspecified: Secondary | ICD-10-CM | POA: Diagnosis not present

## 2019-01-21 DIAGNOSIS — Z6841 Body Mass Index (BMI) 40.0 and over, adult: Secondary | ICD-10-CM | POA: Diagnosis not present

## 2019-01-21 DIAGNOSIS — R2689 Other abnormalities of gait and mobility: Secondary | ICD-10-CM | POA: Diagnosis present

## 2019-01-21 DIAGNOSIS — R0602 Shortness of breath: Secondary | ICD-10-CM | POA: Diagnosis not present

## 2019-01-21 DIAGNOSIS — J9691 Respiratory failure, unspecified with hypoxia: Secondary | ICD-10-CM | POA: Diagnosis not present

## 2019-01-21 DIAGNOSIS — R0600 Dyspnea, unspecified: Secondary | ICD-10-CM | POA: Diagnosis not present

## 2019-01-21 DIAGNOSIS — I21A1 Myocardial infarction type 2: Secondary | ICD-10-CM | POA: Diagnosis not present

## 2019-01-21 DIAGNOSIS — R7989 Other specified abnormal findings of blood chemistry: Secondary | ICD-10-CM | POA: Diagnosis not present

## 2019-01-21 DIAGNOSIS — Z22322 Carrier or suspected carrier of Methicillin resistant Staphylococcus aureus: Secondary | ICD-10-CM | POA: Diagnosis not present

## 2019-01-21 DIAGNOSIS — G4733 Obstructive sleep apnea (adult) (pediatric): Secondary | ICD-10-CM | POA: Diagnosis not present

## 2019-01-21 DIAGNOSIS — I953 Hypotension of hemodialysis: Secondary | ICD-10-CM | POA: Diagnosis not present

## 2019-01-21 DIAGNOSIS — J9621 Acute and chronic respiratory failure with hypoxia: Secondary | ICD-10-CM | POA: Diagnosis present

## 2019-01-21 DIAGNOSIS — Z431 Encounter for attention to gastrostomy: Secondary | ICD-10-CM | POA: Diagnosis not present

## 2019-01-21 DIAGNOSIS — I071 Rheumatic tricuspid insufficiency: Secondary | ICD-10-CM | POA: Diagnosis not present

## 2019-01-25 DIAGNOSIS — Z79899 Other long term (current) drug therapy: Secondary | ICD-10-CM | POA: Diagnosis not present

## 2019-01-25 DIAGNOSIS — E789 Disorder of lipoprotein metabolism, unspecified: Secondary | ICD-10-CM | POA: Diagnosis not present

## 2019-01-25 DIAGNOSIS — I959 Hypotension, unspecified: Secondary | ICD-10-CM | POA: Diagnosis not present

## 2019-01-25 DIAGNOSIS — K819 Cholecystitis, unspecified: Secondary | ICD-10-CM | POA: Diagnosis present

## 2019-01-25 DIAGNOSIS — D631 Anemia in chronic kidney disease: Secondary | ICD-10-CM | POA: Diagnosis not present

## 2019-01-25 DIAGNOSIS — J9601 Acute respiratory failure with hypoxia: Secondary | ICD-10-CM | POA: Diagnosis not present

## 2019-01-25 DIAGNOSIS — J9509 Other tracheostomy complication: Secondary | ICD-10-CM | POA: Diagnosis not present

## 2019-01-25 DIAGNOSIS — M6259 Muscle wasting and atrophy, not elsewhere classified, multiple sites: Secondary | ICD-10-CM | POA: Diagnosis not present

## 2019-01-25 DIAGNOSIS — J449 Chronic obstructive pulmonary disease, unspecified: Secondary | ICD-10-CM | POA: Diagnosis not present

## 2019-01-25 DIAGNOSIS — D649 Anemia, unspecified: Secondary | ICD-10-CM | POA: Diagnosis not present

## 2019-01-25 DIAGNOSIS — R2689 Other abnormalities of gait and mobility: Secondary | ICD-10-CM | POA: Diagnosis present

## 2019-01-25 DIAGNOSIS — Z930 Tracheostomy status: Secondary | ICD-10-CM | POA: Diagnosis not present

## 2019-01-25 DIAGNOSIS — Z22322 Carrier or suspected carrier of Methicillin resistant Staphylococcus aureus: Secondary | ICD-10-CM | POA: Diagnosis not present

## 2019-01-25 DIAGNOSIS — E749 Disorder of carbohydrate metabolism, unspecified: Secondary | ICD-10-CM | POA: Diagnosis not present

## 2019-01-25 DIAGNOSIS — Z431 Encounter for attention to gastrostomy: Secondary | ICD-10-CM | POA: Diagnosis not present

## 2019-01-25 DIAGNOSIS — Z833 Family history of diabetes mellitus: Secondary | ICD-10-CM | POA: Diagnosis not present

## 2019-01-25 DIAGNOSIS — R1312 Dysphagia, oropharyngeal phase: Secondary | ICD-10-CM | POA: Diagnosis present

## 2019-01-25 DIAGNOSIS — Z992 Dependence on renal dialysis: Secondary | ICD-10-CM | POA: Diagnosis not present

## 2019-01-25 DIAGNOSIS — Z9989 Dependence on other enabling machines and devices: Secondary | ICD-10-CM | POA: Diagnosis not present

## 2019-01-25 DIAGNOSIS — R069 Unspecified abnormalities of breathing: Secondary | ICD-10-CM | POA: Diagnosis not present

## 2019-01-25 DIAGNOSIS — N186 End stage renal disease: Secondary | ICD-10-CM | POA: Diagnosis present

## 2019-01-25 DIAGNOSIS — N179 Acute kidney failure, unspecified: Secondary | ICD-10-CM | POA: Diagnosis not present

## 2019-01-25 DIAGNOSIS — E119 Type 2 diabetes mellitus without complications: Secondary | ICD-10-CM | POA: Diagnosis present

## 2019-01-25 DIAGNOSIS — Z7982 Long term (current) use of aspirin: Secondary | ICD-10-CM | POA: Diagnosis not present

## 2019-01-25 DIAGNOSIS — E878 Other disorders of electrolyte and fluid balance, not elsewhere classified: Secondary | ICD-10-CM | POA: Diagnosis not present

## 2019-01-25 DIAGNOSIS — Z87898 Personal history of other specified conditions: Secondary | ICD-10-CM | POA: Diagnosis not present

## 2019-01-25 DIAGNOSIS — N2581 Secondary hyperparathyroidism of renal origin: Secondary | ICD-10-CM | POA: Diagnosis not present

## 2019-01-25 DIAGNOSIS — Z7901 Long term (current) use of anticoagulants: Secondary | ICD-10-CM | POA: Diagnosis not present

## 2019-01-25 DIAGNOSIS — I5030 Unspecified diastolic (congestive) heart failure: Secondary | ICD-10-CM | POA: Diagnosis not present

## 2019-01-25 DIAGNOSIS — Z87891 Personal history of nicotine dependence: Secondary | ICD-10-CM | POA: Diagnosis not present

## 2019-01-25 DIAGNOSIS — E7870 Disorder of bile acid and cholesterol metabolism, unspecified: Secondary | ICD-10-CM | POA: Diagnosis not present

## 2019-01-25 DIAGNOSIS — E662 Morbid (severe) obesity with alveolar hypoventilation: Secondary | ICD-10-CM | POA: Diagnosis present

## 2019-01-25 DIAGNOSIS — I10 Essential (primary) hypertension: Secondary | ICD-10-CM | POA: Diagnosis not present

## 2019-01-25 DIAGNOSIS — E756 Lipid storage disorder, unspecified: Secondary | ICD-10-CM | POA: Diagnosis not present

## 2019-01-25 DIAGNOSIS — J386 Stenosis of larynx: Secondary | ICD-10-CM | POA: Diagnosis not present

## 2019-01-25 DIAGNOSIS — F419 Anxiety disorder, unspecified: Secondary | ICD-10-CM | POA: Diagnosis not present

## 2019-01-25 DIAGNOSIS — D638 Anemia in other chronic diseases classified elsewhere: Secondary | ICD-10-CM | POA: Diagnosis not present

## 2019-01-25 DIAGNOSIS — J9500 Unspecified tracheostomy complication: Secondary | ICD-10-CM | POA: Diagnosis not present

## 2019-01-25 DIAGNOSIS — J9620 Acute and chronic respiratory failure, unspecified whether with hypoxia or hypercapnia: Secondary | ICD-10-CM | POA: Diagnosis present

## 2019-01-25 DIAGNOSIS — F4323 Adjustment disorder with mixed anxiety and depressed mood: Secondary | ICD-10-CM | POA: Diagnosis not present

## 2019-01-25 DIAGNOSIS — J189 Pneumonia, unspecified organism: Secondary | ICD-10-CM | POA: Diagnosis not present

## 2019-01-25 DIAGNOSIS — Z578 Occupational exposure to other risk factors: Secondary | ICD-10-CM | POA: Diagnosis not present

## 2019-01-25 DIAGNOSIS — R278 Other lack of coordination: Secondary | ICD-10-CM | POA: Diagnosis not present

## 2019-01-25 DIAGNOSIS — Z87448 Personal history of other diseases of urinary system: Secondary | ICD-10-CM | POA: Diagnosis not present

## 2019-01-25 DIAGNOSIS — R490 Dysphonia: Secondary | ICD-10-CM | POA: Diagnosis not present

## 2019-01-25 DIAGNOSIS — R05 Cough: Secondary | ICD-10-CM | POA: Diagnosis not present

## 2019-01-25 DIAGNOSIS — D509 Iron deficiency anemia, unspecified: Secondary | ICD-10-CM | POA: Diagnosis not present

## 2019-01-25 DIAGNOSIS — M6281 Muscle weakness (generalized): Secondary | ICD-10-CM | POA: Diagnosis present

## 2019-01-25 DIAGNOSIS — Z430 Encounter for attention to tracheostomy: Secondary | ICD-10-CM | POA: Diagnosis not present

## 2019-01-25 DIAGNOSIS — J9621 Acute and chronic respiratory failure with hypoxia: Secondary | ICD-10-CM | POA: Diagnosis present

## 2019-01-25 DIAGNOSIS — F331 Major depressive disorder, recurrent, moderate: Secondary | ICD-10-CM | POA: Diagnosis not present

## 2019-01-25 DIAGNOSIS — J9503 Malfunction of tracheostomy stoma: Secondary | ICD-10-CM | POA: Diagnosis not present

## 2019-01-25 DIAGNOSIS — R079 Chest pain, unspecified: Secondary | ICD-10-CM | POA: Diagnosis not present

## 2019-01-25 DIAGNOSIS — Z794 Long term (current) use of insulin: Secondary | ICD-10-CM | POA: Diagnosis not present

## 2019-01-25 DIAGNOSIS — Z743 Need for continuous supervision: Secondary | ICD-10-CM | POA: Diagnosis not present

## 2019-01-25 DIAGNOSIS — Z736 Limitation of activities due to disability: Secondary | ICD-10-CM | POA: Diagnosis present

## 2019-01-25 DIAGNOSIS — I2510 Atherosclerotic heart disease of native coronary artery without angina pectoris: Secondary | ICD-10-CM | POA: Diagnosis not present

## 2019-01-25 DIAGNOSIS — I517 Cardiomegaly: Secondary | ICD-10-CM | POA: Diagnosis not present

## 2019-01-25 DIAGNOSIS — I509 Heart failure, unspecified: Secondary | ICD-10-CM | POA: Diagnosis present

## 2019-01-25 DIAGNOSIS — R279 Unspecified lack of coordination: Secondary | ICD-10-CM | POA: Diagnosis not present

## 2019-01-25 DIAGNOSIS — E785 Hyperlipidemia, unspecified: Secondary | ICD-10-CM | POA: Diagnosis not present

## 2019-01-28 DIAGNOSIS — D638 Anemia in other chronic diseases classified elsewhere: Secondary | ICD-10-CM | POA: Diagnosis not present

## 2019-01-28 DIAGNOSIS — Z992 Dependence on renal dialysis: Secondary | ICD-10-CM | POA: Diagnosis not present

## 2019-01-28 DIAGNOSIS — I10 Essential (primary) hypertension: Secondary | ICD-10-CM | POA: Diagnosis not present

## 2019-01-28 DIAGNOSIS — N2581 Secondary hyperparathyroidism of renal origin: Secondary | ICD-10-CM | POA: Diagnosis not present

## 2019-01-28 DIAGNOSIS — E785 Hyperlipidemia, unspecified: Secondary | ICD-10-CM | POA: Diagnosis not present

## 2019-01-28 DIAGNOSIS — D631 Anemia in chronic kidney disease: Secondary | ICD-10-CM | POA: Diagnosis not present

## 2019-01-28 DIAGNOSIS — E662 Morbid (severe) obesity with alveolar hypoventilation: Secondary | ICD-10-CM | POA: Diagnosis not present

## 2019-01-28 DIAGNOSIS — J9601 Acute respiratory failure with hypoxia: Secondary | ICD-10-CM | POA: Diagnosis not present

## 2019-01-28 DIAGNOSIS — D509 Iron deficiency anemia, unspecified: Secondary | ICD-10-CM | POA: Diagnosis not present

## 2019-01-28 DIAGNOSIS — I959 Hypotension, unspecified: Secondary | ICD-10-CM | POA: Diagnosis not present

## 2019-01-28 DIAGNOSIS — M6259 Muscle wasting and atrophy, not elsewhere classified, multiple sites: Secondary | ICD-10-CM | POA: Diagnosis not present

## 2019-01-28 DIAGNOSIS — E119 Type 2 diabetes mellitus without complications: Secondary | ICD-10-CM | POA: Diagnosis not present

## 2019-01-28 DIAGNOSIS — I2510 Atherosclerotic heart disease of native coronary artery without angina pectoris: Secondary | ICD-10-CM | POA: Diagnosis not present

## 2019-01-28 DIAGNOSIS — N186 End stage renal disease: Secondary | ICD-10-CM | POA: Diagnosis not present

## 2019-01-28 DIAGNOSIS — J449 Chronic obstructive pulmonary disease, unspecified: Secondary | ICD-10-CM | POA: Diagnosis not present

## 2019-01-28 DIAGNOSIS — I509 Heart failure, unspecified: Secondary | ICD-10-CM | POA: Diagnosis not present

## 2019-01-30 DIAGNOSIS — N186 End stage renal disease: Secondary | ICD-10-CM | POA: Diagnosis not present

## 2019-01-30 DIAGNOSIS — D509 Iron deficiency anemia, unspecified: Secondary | ICD-10-CM | POA: Diagnosis not present

## 2019-01-30 DIAGNOSIS — D631 Anemia in chronic kidney disease: Secondary | ICD-10-CM | POA: Diagnosis not present

## 2019-01-30 DIAGNOSIS — Z992 Dependence on renal dialysis: Secondary | ICD-10-CM | POA: Diagnosis not present

## 2019-01-30 DIAGNOSIS — N2581 Secondary hyperparathyroidism of renal origin: Secondary | ICD-10-CM | POA: Diagnosis not present

## 2019-01-31 DIAGNOSIS — Z992 Dependence on renal dialysis: Secondary | ICD-10-CM | POA: Diagnosis not present

## 2019-01-31 DIAGNOSIS — E785 Hyperlipidemia, unspecified: Secondary | ICD-10-CM | POA: Diagnosis not present

## 2019-01-31 DIAGNOSIS — E119 Type 2 diabetes mellitus without complications: Secondary | ICD-10-CM | POA: Diagnosis not present

## 2019-01-31 DIAGNOSIS — I959 Hypotension, unspecified: Secondary | ICD-10-CM | POA: Diagnosis not present

## 2019-01-31 DIAGNOSIS — I2510 Atherosclerotic heart disease of native coronary artery without angina pectoris: Secondary | ICD-10-CM | POA: Diagnosis not present

## 2019-01-31 DIAGNOSIS — D638 Anemia in other chronic diseases classified elsewhere: Secondary | ICD-10-CM | POA: Diagnosis not present

## 2019-01-31 DIAGNOSIS — I10 Essential (primary) hypertension: Secondary | ICD-10-CM | POA: Diagnosis not present

## 2019-01-31 DIAGNOSIS — E662 Morbid (severe) obesity with alveolar hypoventilation: Secondary | ICD-10-CM | POA: Diagnosis not present

## 2019-01-31 DIAGNOSIS — I509 Heart failure, unspecified: Secondary | ICD-10-CM | POA: Diagnosis not present

## 2019-01-31 DIAGNOSIS — J449 Chronic obstructive pulmonary disease, unspecified: Secondary | ICD-10-CM | POA: Diagnosis not present

## 2019-01-31 DIAGNOSIS — N186 End stage renal disease: Secondary | ICD-10-CM | POA: Diagnosis not present

## 2019-02-01 DIAGNOSIS — D631 Anemia in chronic kidney disease: Secondary | ICD-10-CM | POA: Diagnosis not present

## 2019-02-01 DIAGNOSIS — Z992 Dependence on renal dialysis: Secondary | ICD-10-CM | POA: Diagnosis not present

## 2019-02-01 DIAGNOSIS — N2581 Secondary hyperparathyroidism of renal origin: Secondary | ICD-10-CM | POA: Diagnosis not present

## 2019-02-01 DIAGNOSIS — D509 Iron deficiency anemia, unspecified: Secondary | ICD-10-CM | POA: Diagnosis not present

## 2019-02-01 DIAGNOSIS — N186 End stage renal disease: Secondary | ICD-10-CM | POA: Diagnosis not present

## 2019-02-04 DIAGNOSIS — N2581 Secondary hyperparathyroidism of renal origin: Secondary | ICD-10-CM | POA: Diagnosis not present

## 2019-02-04 DIAGNOSIS — Z992 Dependence on renal dialysis: Secondary | ICD-10-CM | POA: Diagnosis not present

## 2019-02-04 DIAGNOSIS — N186 End stage renal disease: Secondary | ICD-10-CM | POA: Diagnosis not present

## 2019-02-04 DIAGNOSIS — D509 Iron deficiency anemia, unspecified: Secondary | ICD-10-CM | POA: Diagnosis not present

## 2019-02-04 DIAGNOSIS — D631 Anemia in chronic kidney disease: Secondary | ICD-10-CM | POA: Diagnosis not present

## 2019-02-06 DIAGNOSIS — N2581 Secondary hyperparathyroidism of renal origin: Secondary | ICD-10-CM | POA: Diagnosis not present

## 2019-02-06 DIAGNOSIS — D631 Anemia in chronic kidney disease: Secondary | ICD-10-CM | POA: Diagnosis not present

## 2019-02-06 DIAGNOSIS — D509 Iron deficiency anemia, unspecified: Secondary | ICD-10-CM | POA: Diagnosis not present

## 2019-02-06 DIAGNOSIS — N186 End stage renal disease: Secondary | ICD-10-CM | POA: Diagnosis not present

## 2019-02-06 DIAGNOSIS — Z992 Dependence on renal dialysis: Secondary | ICD-10-CM | POA: Diagnosis not present

## 2019-02-08 DIAGNOSIS — M6259 Muscle wasting and atrophy, not elsewhere classified, multiple sites: Secondary | ICD-10-CM | POA: Diagnosis not present

## 2019-02-08 DIAGNOSIS — D631 Anemia in chronic kidney disease: Secondary | ICD-10-CM | POA: Diagnosis not present

## 2019-02-08 DIAGNOSIS — N2581 Secondary hyperparathyroidism of renal origin: Secondary | ICD-10-CM | POA: Diagnosis not present

## 2019-02-08 DIAGNOSIS — N186 End stage renal disease: Secondary | ICD-10-CM | POA: Diagnosis not present

## 2019-02-08 DIAGNOSIS — Z992 Dependence on renal dialysis: Secondary | ICD-10-CM | POA: Diagnosis not present

## 2019-02-08 DIAGNOSIS — J9601 Acute respiratory failure with hypoxia: Secondary | ICD-10-CM | POA: Diagnosis not present

## 2019-02-08 DIAGNOSIS — D509 Iron deficiency anemia, unspecified: Secondary | ICD-10-CM | POA: Diagnosis not present

## 2019-02-11 DIAGNOSIS — I959 Hypotension, unspecified: Secondary | ICD-10-CM | POA: Diagnosis not present

## 2019-02-11 DIAGNOSIS — D638 Anemia in other chronic diseases classified elsewhere: Secondary | ICD-10-CM | POA: Diagnosis not present

## 2019-02-11 DIAGNOSIS — N2581 Secondary hyperparathyroidism of renal origin: Secondary | ICD-10-CM | POA: Diagnosis not present

## 2019-02-11 DIAGNOSIS — D509 Iron deficiency anemia, unspecified: Secondary | ICD-10-CM | POA: Diagnosis not present

## 2019-02-11 DIAGNOSIS — I2510 Atherosclerotic heart disease of native coronary artery without angina pectoris: Secondary | ICD-10-CM | POA: Diagnosis not present

## 2019-02-11 DIAGNOSIS — Z992 Dependence on renal dialysis: Secondary | ICD-10-CM | POA: Diagnosis not present

## 2019-02-11 DIAGNOSIS — E662 Morbid (severe) obesity with alveolar hypoventilation: Secondary | ICD-10-CM | POA: Diagnosis not present

## 2019-02-11 DIAGNOSIS — N186 End stage renal disease: Secondary | ICD-10-CM | POA: Diagnosis not present

## 2019-02-11 DIAGNOSIS — E119 Type 2 diabetes mellitus without complications: Secondary | ICD-10-CM | POA: Diagnosis not present

## 2019-02-11 DIAGNOSIS — J449 Chronic obstructive pulmonary disease, unspecified: Secondary | ICD-10-CM | POA: Diagnosis not present

## 2019-02-11 DIAGNOSIS — I10 Essential (primary) hypertension: Secondary | ICD-10-CM | POA: Diagnosis not present

## 2019-02-11 DIAGNOSIS — D631 Anemia in chronic kidney disease: Secondary | ICD-10-CM | POA: Diagnosis not present

## 2019-02-11 DIAGNOSIS — I509 Heart failure, unspecified: Secondary | ICD-10-CM | POA: Diagnosis not present

## 2019-02-11 DIAGNOSIS — E785 Hyperlipidemia, unspecified: Secondary | ICD-10-CM | POA: Diagnosis not present

## 2019-02-13 DIAGNOSIS — E785 Hyperlipidemia, unspecified: Secondary | ICD-10-CM | POA: Diagnosis not present

## 2019-02-13 DIAGNOSIS — R490 Dysphonia: Secondary | ICD-10-CM | POA: Diagnosis not present

## 2019-02-13 DIAGNOSIS — J386 Stenosis of larynx: Secondary | ICD-10-CM | POA: Diagnosis not present

## 2019-02-13 DIAGNOSIS — N186 End stage renal disease: Secondary | ICD-10-CM | POA: Diagnosis not present

## 2019-02-13 DIAGNOSIS — Z833 Family history of diabetes mellitus: Secondary | ICD-10-CM | POA: Diagnosis not present

## 2019-02-13 DIAGNOSIS — J9601 Acute respiratory failure with hypoxia: Secondary | ICD-10-CM | POA: Diagnosis not present

## 2019-02-13 DIAGNOSIS — D638 Anemia in other chronic diseases classified elsewhere: Secondary | ICD-10-CM | POA: Diagnosis not present

## 2019-02-13 DIAGNOSIS — J9500 Unspecified tracheostomy complication: Secondary | ICD-10-CM | POA: Diagnosis not present

## 2019-02-13 DIAGNOSIS — J449 Chronic obstructive pulmonary disease, unspecified: Secondary | ICD-10-CM | POA: Diagnosis not present

## 2019-02-13 DIAGNOSIS — J9620 Acute and chronic respiratory failure, unspecified whether with hypoxia or hypercapnia: Secondary | ICD-10-CM | POA: Diagnosis not present

## 2019-02-13 DIAGNOSIS — I509 Heart failure, unspecified: Secondary | ICD-10-CM | POA: Diagnosis not present

## 2019-02-13 DIAGNOSIS — E662 Morbid (severe) obesity with alveolar hypoventilation: Secondary | ICD-10-CM | POA: Diagnosis not present

## 2019-02-13 DIAGNOSIS — Z87448 Personal history of other diseases of urinary system: Secondary | ICD-10-CM | POA: Diagnosis not present

## 2019-02-13 DIAGNOSIS — Z87898 Personal history of other specified conditions: Secondary | ICD-10-CM | POA: Diagnosis not present

## 2019-02-13 DIAGNOSIS — I2510 Atherosclerotic heart disease of native coronary artery without angina pectoris: Secondary | ICD-10-CM | POA: Diagnosis not present

## 2019-02-13 DIAGNOSIS — I959 Hypotension, unspecified: Secondary | ICD-10-CM | POA: Diagnosis not present

## 2019-02-13 DIAGNOSIS — E119 Type 2 diabetes mellitus without complications: Secondary | ICD-10-CM | POA: Diagnosis not present

## 2019-02-13 DIAGNOSIS — Z992 Dependence on renal dialysis: Secondary | ICD-10-CM | POA: Diagnosis not present

## 2019-02-13 DIAGNOSIS — D631 Anemia in chronic kidney disease: Secondary | ICD-10-CM | POA: Diagnosis not present

## 2019-02-13 DIAGNOSIS — D509 Iron deficiency anemia, unspecified: Secondary | ICD-10-CM | POA: Diagnosis not present

## 2019-02-13 DIAGNOSIS — N2581 Secondary hyperparathyroidism of renal origin: Secondary | ICD-10-CM | POA: Diagnosis not present

## 2019-02-13 DIAGNOSIS — Z930 Tracheostomy status: Secondary | ICD-10-CM | POA: Diagnosis not present

## 2019-02-13 DIAGNOSIS — M6259 Muscle wasting and atrophy, not elsewhere classified, multiple sites: Secondary | ICD-10-CM | POA: Diagnosis not present

## 2019-02-15 DIAGNOSIS — D631 Anemia in chronic kidney disease: Secondary | ICD-10-CM | POA: Diagnosis not present

## 2019-02-15 DIAGNOSIS — J9503 Malfunction of tracheostomy stoma: Secondary | ICD-10-CM | POA: Diagnosis not present

## 2019-02-15 DIAGNOSIS — J9509 Other tracheostomy complication: Secondary | ICD-10-CM | POA: Diagnosis not present

## 2019-02-15 DIAGNOSIS — Z794 Long term (current) use of insulin: Secondary | ICD-10-CM | POA: Diagnosis not present

## 2019-02-15 DIAGNOSIS — N2581 Secondary hyperparathyroidism of renal origin: Secondary | ICD-10-CM | POA: Diagnosis not present

## 2019-02-15 DIAGNOSIS — N186 End stage renal disease: Secondary | ICD-10-CM | POA: Diagnosis not present

## 2019-02-15 DIAGNOSIS — Z79899 Other long term (current) drug therapy: Secondary | ICD-10-CM | POA: Diagnosis not present

## 2019-02-15 DIAGNOSIS — Z7982 Long term (current) use of aspirin: Secondary | ICD-10-CM | POA: Diagnosis not present

## 2019-02-15 DIAGNOSIS — Z992 Dependence on renal dialysis: Secondary | ICD-10-CM | POA: Diagnosis not present

## 2019-02-15 DIAGNOSIS — Z87891 Personal history of nicotine dependence: Secondary | ICD-10-CM | POA: Diagnosis not present

## 2019-02-15 DIAGNOSIS — D509 Iron deficiency anemia, unspecified: Secondary | ICD-10-CM | POA: Diagnosis not present

## 2019-02-17 DIAGNOSIS — N186 End stage renal disease: Secondary | ICD-10-CM | POA: Diagnosis not present

## 2019-02-17 DIAGNOSIS — Z992 Dependence on renal dialysis: Secondary | ICD-10-CM | POA: Diagnosis not present

## 2019-02-18 DIAGNOSIS — N179 Acute kidney failure, unspecified: Secondary | ICD-10-CM | POA: Diagnosis not present

## 2019-02-18 DIAGNOSIS — N186 End stage renal disease: Secondary | ICD-10-CM | POA: Diagnosis not present

## 2019-02-18 DIAGNOSIS — Z992 Dependence on renal dialysis: Secondary | ICD-10-CM | POA: Diagnosis not present

## 2019-02-18 DIAGNOSIS — D631 Anemia in chronic kidney disease: Secondary | ICD-10-CM | POA: Diagnosis not present

## 2019-02-18 DIAGNOSIS — D509 Iron deficiency anemia, unspecified: Secondary | ICD-10-CM | POA: Diagnosis not present

## 2019-02-18 DIAGNOSIS — N2581 Secondary hyperparathyroidism of renal origin: Secondary | ICD-10-CM | POA: Diagnosis not present

## 2019-02-19 DIAGNOSIS — I509 Heart failure, unspecified: Secondary | ICD-10-CM | POA: Diagnosis not present

## 2019-02-19 DIAGNOSIS — J449 Chronic obstructive pulmonary disease, unspecified: Secondary | ICD-10-CM | POA: Diagnosis not present

## 2019-02-19 DIAGNOSIS — E119 Type 2 diabetes mellitus without complications: Secondary | ICD-10-CM | POA: Diagnosis not present

## 2019-02-19 DIAGNOSIS — D638 Anemia in other chronic diseases classified elsewhere: Secondary | ICD-10-CM | POA: Diagnosis not present

## 2019-02-19 DIAGNOSIS — Z992 Dependence on renal dialysis: Secondary | ICD-10-CM | POA: Diagnosis not present

## 2019-02-19 DIAGNOSIS — E785 Hyperlipidemia, unspecified: Secondary | ICD-10-CM | POA: Diagnosis not present

## 2019-02-19 DIAGNOSIS — I2510 Atherosclerotic heart disease of native coronary artery without angina pectoris: Secondary | ICD-10-CM | POA: Diagnosis not present

## 2019-02-19 DIAGNOSIS — I959 Hypotension, unspecified: Secondary | ICD-10-CM | POA: Diagnosis not present

## 2019-02-19 DIAGNOSIS — N186 End stage renal disease: Secondary | ICD-10-CM | POA: Diagnosis not present

## 2019-02-19 DIAGNOSIS — E662 Morbid (severe) obesity with alveolar hypoventilation: Secondary | ICD-10-CM | POA: Diagnosis not present

## 2019-02-19 DIAGNOSIS — J9500 Unspecified tracheostomy complication: Secondary | ICD-10-CM | POA: Diagnosis not present

## 2019-02-20 DIAGNOSIS — J9601 Acute respiratory failure with hypoxia: Secondary | ICD-10-CM | POA: Diagnosis not present

## 2019-02-20 DIAGNOSIS — D509 Iron deficiency anemia, unspecified: Secondary | ICD-10-CM | POA: Diagnosis not present

## 2019-02-20 DIAGNOSIS — Z992 Dependence on renal dialysis: Secondary | ICD-10-CM | POA: Diagnosis not present

## 2019-02-20 DIAGNOSIS — N186 End stage renal disease: Secondary | ICD-10-CM | POA: Diagnosis not present

## 2019-02-20 DIAGNOSIS — M6259 Muscle wasting and atrophy, not elsewhere classified, multiple sites: Secondary | ICD-10-CM | POA: Diagnosis not present

## 2019-02-20 DIAGNOSIS — N2581 Secondary hyperparathyroidism of renal origin: Secondary | ICD-10-CM | POA: Diagnosis not present

## 2019-02-20 DIAGNOSIS — N179 Acute kidney failure, unspecified: Secondary | ICD-10-CM | POA: Diagnosis not present

## 2019-02-20 DIAGNOSIS — D631 Anemia in chronic kidney disease: Secondary | ICD-10-CM | POA: Diagnosis not present

## 2019-02-21 DIAGNOSIS — F4323 Adjustment disorder with mixed anxiety and depressed mood: Secondary | ICD-10-CM | POA: Diagnosis not present

## 2019-02-21 DIAGNOSIS — I5030 Unspecified diastolic (congestive) heart failure: Secondary | ICD-10-CM | POA: Diagnosis not present

## 2019-02-21 DIAGNOSIS — F331 Major depressive disorder, recurrent, moderate: Secondary | ICD-10-CM | POA: Diagnosis not present

## 2019-02-22 DIAGNOSIS — N179 Acute kidney failure, unspecified: Secondary | ICD-10-CM | POA: Diagnosis not present

## 2019-02-22 DIAGNOSIS — N186 End stage renal disease: Secondary | ICD-10-CM | POA: Diagnosis not present

## 2019-02-22 DIAGNOSIS — D631 Anemia in chronic kidney disease: Secondary | ICD-10-CM | POA: Diagnosis not present

## 2019-02-22 DIAGNOSIS — Z992 Dependence on renal dialysis: Secondary | ICD-10-CM | POA: Diagnosis not present

## 2019-02-22 DIAGNOSIS — D509 Iron deficiency anemia, unspecified: Secondary | ICD-10-CM | POA: Diagnosis not present

## 2019-02-22 DIAGNOSIS — N2581 Secondary hyperparathyroidism of renal origin: Secondary | ICD-10-CM | POA: Diagnosis not present

## 2019-02-25 DIAGNOSIS — N179 Acute kidney failure, unspecified: Secondary | ICD-10-CM | POA: Diagnosis not present

## 2019-02-25 DIAGNOSIS — D509 Iron deficiency anemia, unspecified: Secondary | ICD-10-CM | POA: Diagnosis not present

## 2019-02-25 DIAGNOSIS — E119 Type 2 diabetes mellitus without complications: Secondary | ICD-10-CM | POA: Diagnosis not present

## 2019-02-25 DIAGNOSIS — J449 Chronic obstructive pulmonary disease, unspecified: Secondary | ICD-10-CM | POA: Diagnosis not present

## 2019-02-25 DIAGNOSIS — E785 Hyperlipidemia, unspecified: Secondary | ICD-10-CM | POA: Diagnosis not present

## 2019-02-25 DIAGNOSIS — F419 Anxiety disorder, unspecified: Secondary | ICD-10-CM | POA: Diagnosis not present

## 2019-02-25 DIAGNOSIS — D638 Anemia in other chronic diseases classified elsewhere: Secondary | ICD-10-CM | POA: Diagnosis not present

## 2019-02-25 DIAGNOSIS — I959 Hypotension, unspecified: Secondary | ICD-10-CM | POA: Diagnosis not present

## 2019-02-25 DIAGNOSIS — I509 Heart failure, unspecified: Secondary | ICD-10-CM | POA: Diagnosis not present

## 2019-02-25 DIAGNOSIS — E662 Morbid (severe) obesity with alveolar hypoventilation: Secondary | ICD-10-CM | POA: Diagnosis not present

## 2019-02-25 DIAGNOSIS — I2510 Atherosclerotic heart disease of native coronary artery without angina pectoris: Secondary | ICD-10-CM | POA: Diagnosis not present

## 2019-02-25 DIAGNOSIS — N186 End stage renal disease: Secondary | ICD-10-CM | POA: Diagnosis not present

## 2019-02-25 DIAGNOSIS — Z992 Dependence on renal dialysis: Secondary | ICD-10-CM | POA: Diagnosis not present

## 2019-02-25 DIAGNOSIS — D631 Anemia in chronic kidney disease: Secondary | ICD-10-CM | POA: Diagnosis not present

## 2019-02-25 DIAGNOSIS — N2581 Secondary hyperparathyroidism of renal origin: Secondary | ICD-10-CM | POA: Diagnosis not present

## 2019-02-26 DIAGNOSIS — I509 Heart failure, unspecified: Secondary | ICD-10-CM | POA: Diagnosis not present

## 2019-02-26 DIAGNOSIS — D638 Anemia in other chronic diseases classified elsewhere: Secondary | ICD-10-CM | POA: Diagnosis not present

## 2019-02-26 DIAGNOSIS — R05 Cough: Secondary | ICD-10-CM | POA: Diagnosis not present

## 2019-02-26 DIAGNOSIS — N186 End stage renal disease: Secondary | ICD-10-CM | POA: Diagnosis not present

## 2019-02-26 DIAGNOSIS — I2510 Atherosclerotic heart disease of native coronary artery without angina pectoris: Secondary | ICD-10-CM | POA: Diagnosis not present

## 2019-02-26 DIAGNOSIS — E662 Morbid (severe) obesity with alveolar hypoventilation: Secondary | ICD-10-CM | POA: Diagnosis not present

## 2019-02-26 DIAGNOSIS — I959 Hypotension, unspecified: Secondary | ICD-10-CM | POA: Diagnosis not present

## 2019-02-26 DIAGNOSIS — E119 Type 2 diabetes mellitus without complications: Secondary | ICD-10-CM | POA: Diagnosis not present

## 2019-02-26 DIAGNOSIS — J189 Pneumonia, unspecified organism: Secondary | ICD-10-CM | POA: Diagnosis not present

## 2019-02-26 DIAGNOSIS — Z992 Dependence on renal dialysis: Secondary | ICD-10-CM | POA: Diagnosis not present

## 2019-02-26 DIAGNOSIS — E785 Hyperlipidemia, unspecified: Secondary | ICD-10-CM | POA: Diagnosis not present

## 2019-02-26 DIAGNOSIS — F419 Anxiety disorder, unspecified: Secondary | ICD-10-CM | POA: Diagnosis not present

## 2019-02-26 DIAGNOSIS — R079 Chest pain, unspecified: Secondary | ICD-10-CM | POA: Diagnosis not present

## 2019-02-26 DIAGNOSIS — I517 Cardiomegaly: Secondary | ICD-10-CM | POA: Diagnosis not present

## 2019-02-27 DIAGNOSIS — N2581 Secondary hyperparathyroidism of renal origin: Secondary | ICD-10-CM | POA: Diagnosis not present

## 2019-02-27 DIAGNOSIS — D509 Iron deficiency anemia, unspecified: Secondary | ICD-10-CM | POA: Diagnosis not present

## 2019-02-27 DIAGNOSIS — N179 Acute kidney failure, unspecified: Secondary | ICD-10-CM | POA: Diagnosis not present

## 2019-02-27 DIAGNOSIS — D631 Anemia in chronic kidney disease: Secondary | ICD-10-CM | POA: Diagnosis not present

## 2019-02-27 DIAGNOSIS — Z992 Dependence on renal dialysis: Secondary | ICD-10-CM | POA: Diagnosis not present

## 2019-02-27 DIAGNOSIS — N186 End stage renal disease: Secondary | ICD-10-CM | POA: Diagnosis not present

## 2019-03-20 DEATH — deceased

## 2019-05-21 IMAGING — DX DG CHEST 1V PORT
1 series · 1 of 1 positions shown · non-contrast
Comparison: 11/01/2018

CLINICAL DATA: Pneumonia

EXAM:
PORTABLE CHEST 1 VIEW

[chest ap]
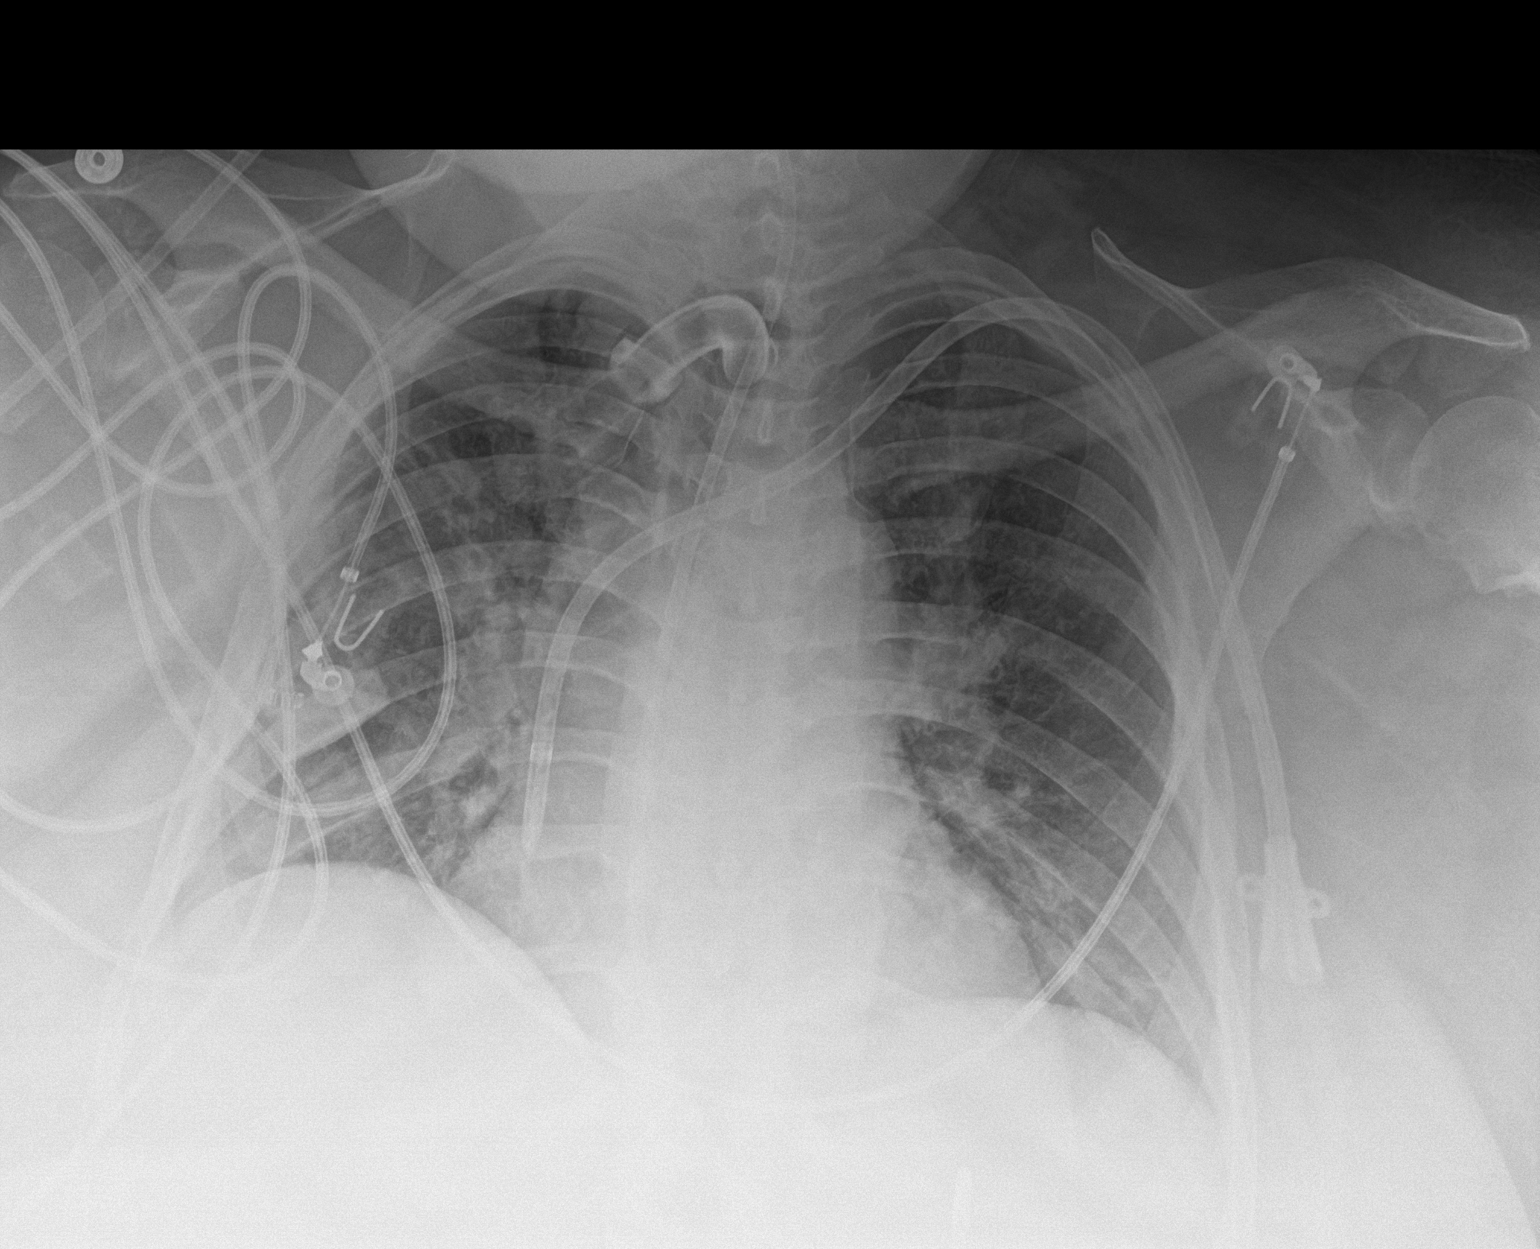

[1 of 1 positions shown; findings below may reference images not displayed]

FINDINGS: Tracheostomy tube, feeding tube, and left subclavian dialysis
catheter remain in place.

Low lung volumes are present, causing crowding of the pulmonary
vasculature. Interstitial accentuation persists. Indistinct right
perihilar airspace opacity. Subsegmental atelectasis at the left
lung base. No blunting of the costophrenic angles.
IMPRESSION: 1. Indistinct perihilar airspace opacity on the right, potentially
from resolving edema. Interstitial accentuation noted bilaterally.
Overall the appearance is mildly improved from 11/01/2018.
2. Low lung volumes.

## 2019-05-25 IMAGING — US IR CHOLECYSTOSTOMY
1 series · 3 of 3 positions shown · non-contrast
Comparison: none

INDICATION: Acute calculus cholecystitis

[Series 1: ir cholecystostomy · 3 of 3 slices shown]
[im 1/3]
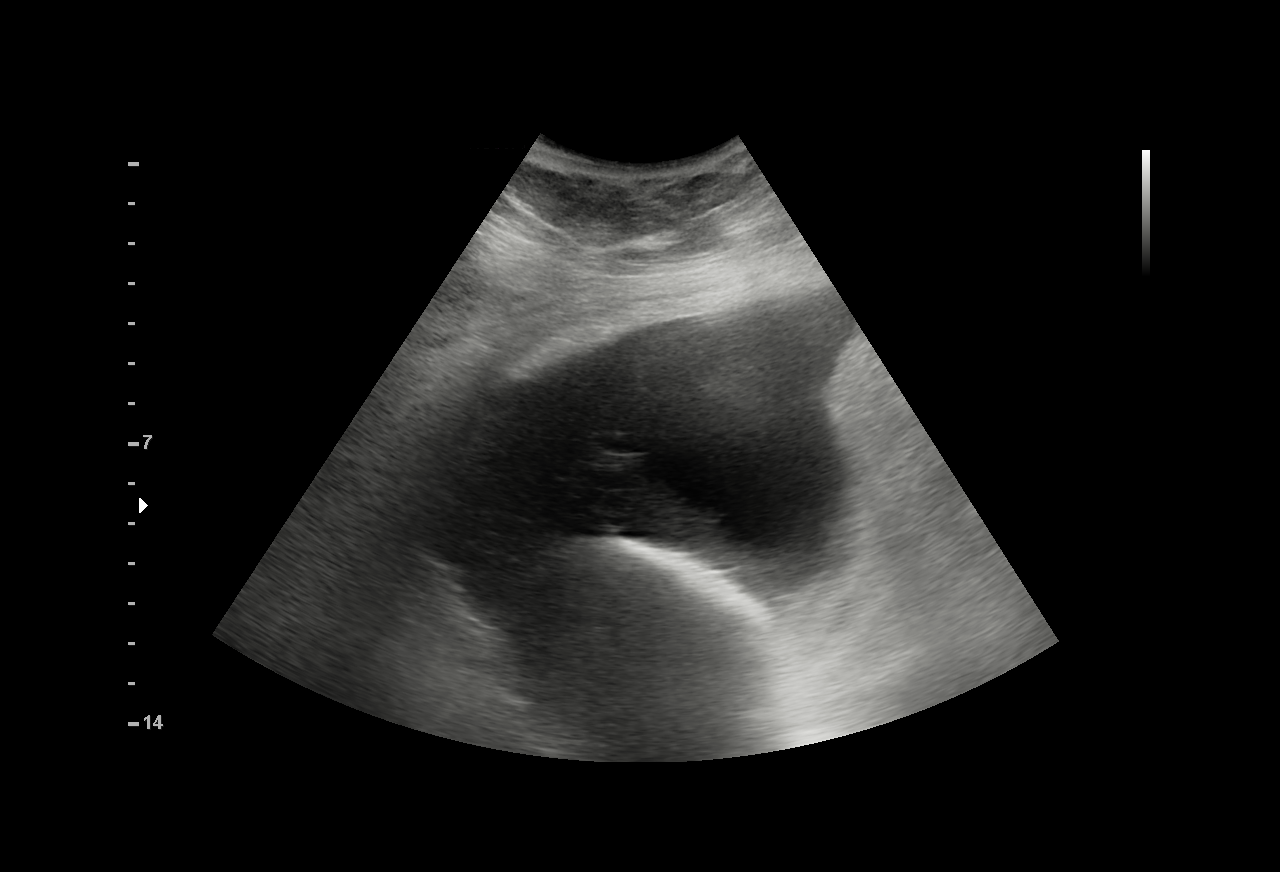
[im 2/3]
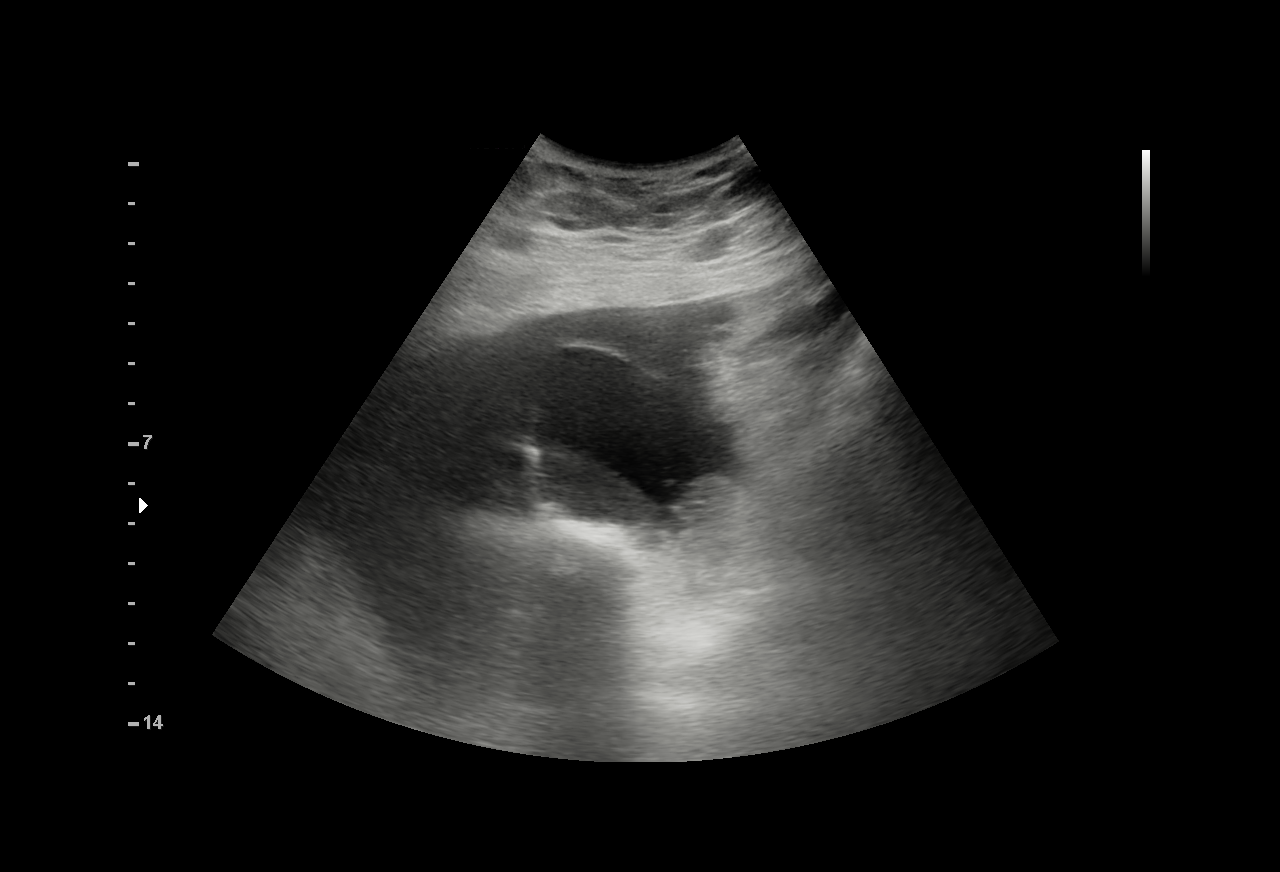
[im 3/3]
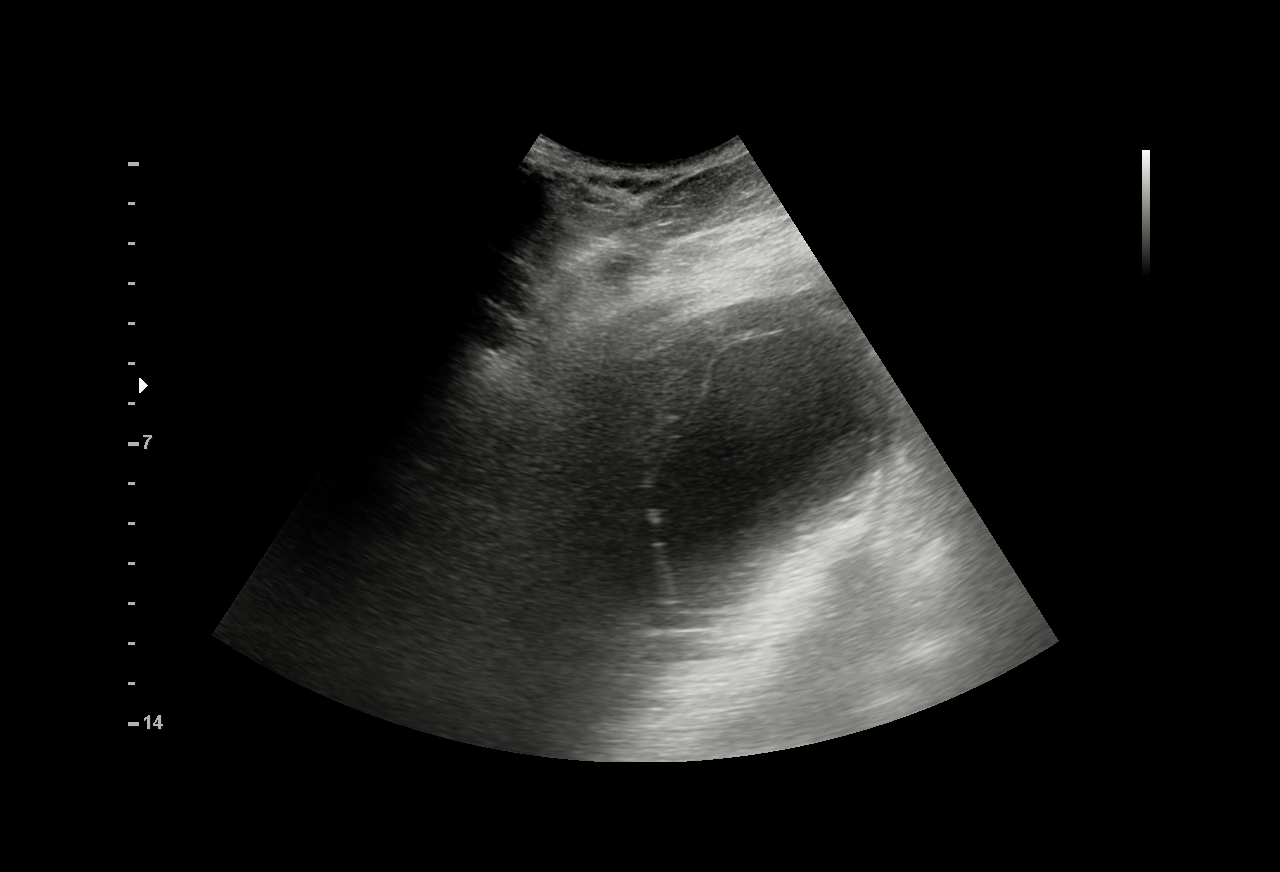

[3 of 3 positions shown; findings below may reference images not displayed]

EXAM:
Percutaneous cholecystostomy

MEDICATIONS:
3.375 g Zosyn; The antibiotic was administered within an appropriate
time frame prior to the initiation of the procedure.

ANESTHESIA/SEDATION:
Moderate (conscious) sedation was employed during this procedure. A
total of Versed 1.5 mg and Fentanyl 75 mcg was administered
intravenously.

Moderate Sedation Time: 19 minutes. The patient's level of
consciousness and vital signs were monitored continuously by
radiology nursing throughout the procedure under my direct
supervision.

FLUOROSCOPY TIME:  Fluoroscopy Time: 0 minutes 18 seconds (4 mGy).

COMPLICATIONS:
None immediate.

PROCEDURE:
Informed written consent was obtained from the the patient and her
family after a thorough discussion of the procedural risks, benefits
and alternatives. All questions were addressed. Maximal Sterile
Barrier Technique was utilized including caps, mask, sterile gowns,
sterile gloves, sterile drape, hand hygiene and skin antiseptic. A
timeout was performed prior to the initiation of the procedure.

Previous imaging reviewed. Preliminary ultrasound performed. The
distended thick-walled gallbladder was localized in the right upper
quadrant. Overlying skin marked for a transhepatic approach.

Under sterile conditions and local anesthesia, a 21 gauge access
needle was advanced percutaneously from a transhepatic approach into
the gallbladder. Needle position confirmed with ultrasound. Images
obtained for documentation. There was return of bile. Guidewire
inserted followed by Accustick dilator set. Amplatz guidewire
advanced. Tract dilatation performed to insert a 10 French drain.
Drain catheter position confirmed with ultrasound and fluoroscopy.
Syringe aspiration yielded 150 cc bile. Sample sent for culture.
Catheter secured with Prolene suture and a sterile dressing. Gravity
drainage bag connected. No immediate complication. Patient tolerated
the procedure well.
IMPRESSION: Successful ultrasound and fluoroscopic 10 French percutaneous
cholecystostomy

## 2019-05-27 IMAGING — DX DG CHEST 1V PORT
1 series · 1 of 1 positions shown · non-contrast
Comparison: Chest radiograph November 09, 2018

CLINICAL DATA: Follow-up central venous line placement.

EXAM:
PORTABLE CHEST 1 VIEW

[chest ap]
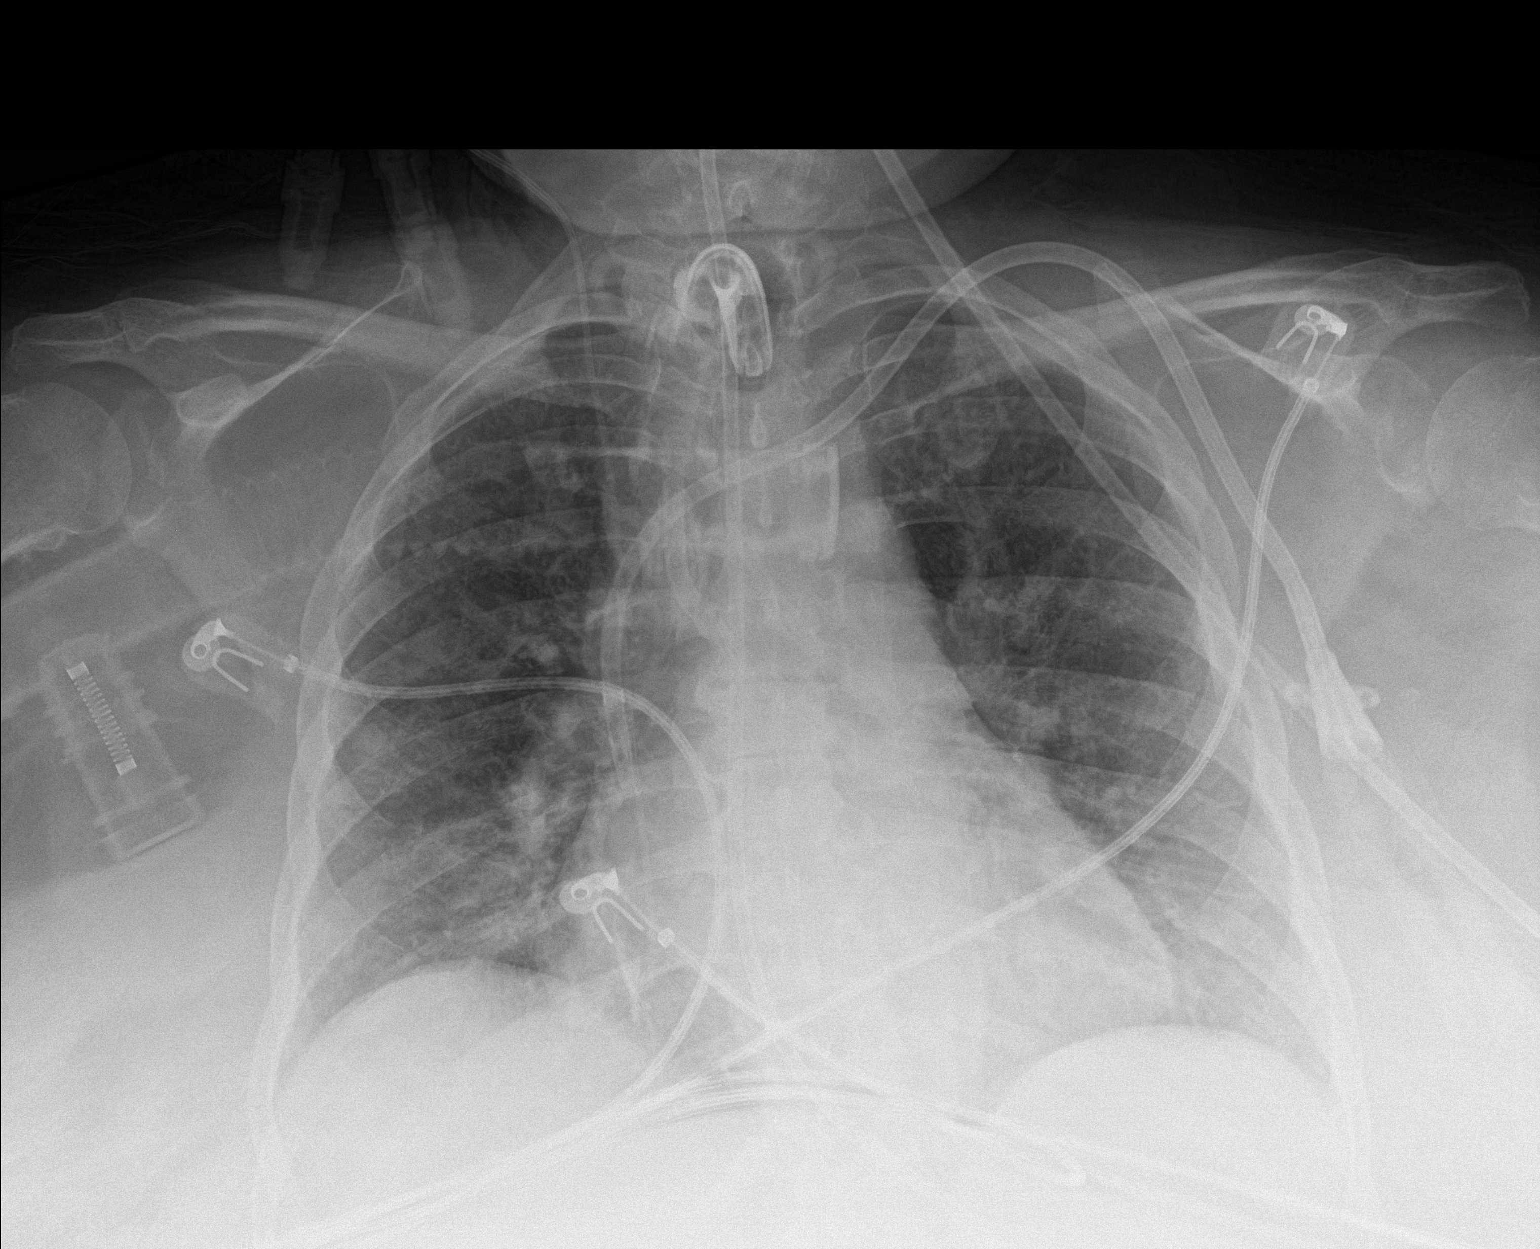

[1 of 1 positions shown; findings below may reference images not displayed]

FINDINGS: RIGHT internal jugular central venous catheter distal tip projects
in RIGHT atrium. Tunneled dialysis catheter via LEFT subclavian
venous approach with distal tip projecting distal superior vena
cava. Tracheostomy tube in place. Feeding tube past GE junction,
distal tip out of field of view.

Stable cardiomegaly and pulmonary vascular congestion. No pleural
effusion or focal consolidation. No pneumothorax. Soft tissue planes
and included osseous structures are unchanged.
IMPRESSION: 1. RIGHT internal jugular central venous catheter tip projects in
RIGHT atrium, recommend 2 cm retraction. Otherwise stable appearance
of life support lines.
2. Stable cardiomegaly and pulmonary vascular congestion.

## 2020-10-04 IMAGING — US US ABDOMEN LIMITED
1 series · 14 of 23 positions shown · non-contrast
Comparison: 11/08/2018 CT

CLINICAL DATA: Right upper quadrant pain

EXAM:
ULTRASOUND ABDOMEN LIMITED RIGHT UPPER QUADRANT

[Series 1: us abdomen limited · 0.25mm/px · 14 of 23 slices shown]
[im 1/23]
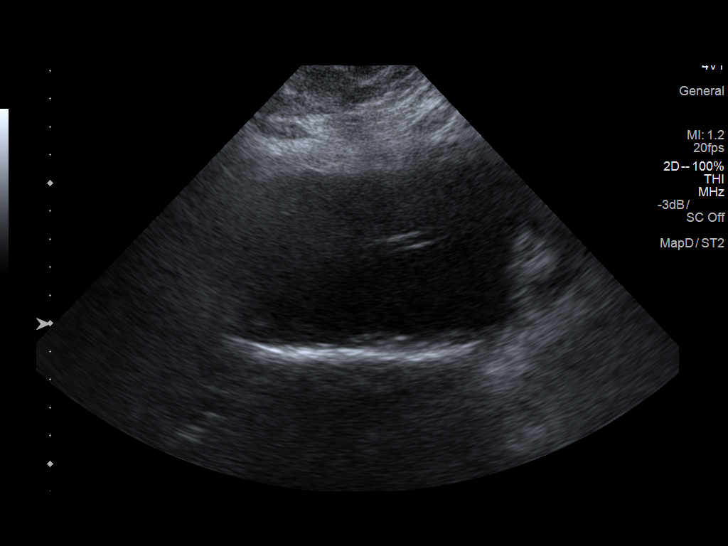
[im 3/23]
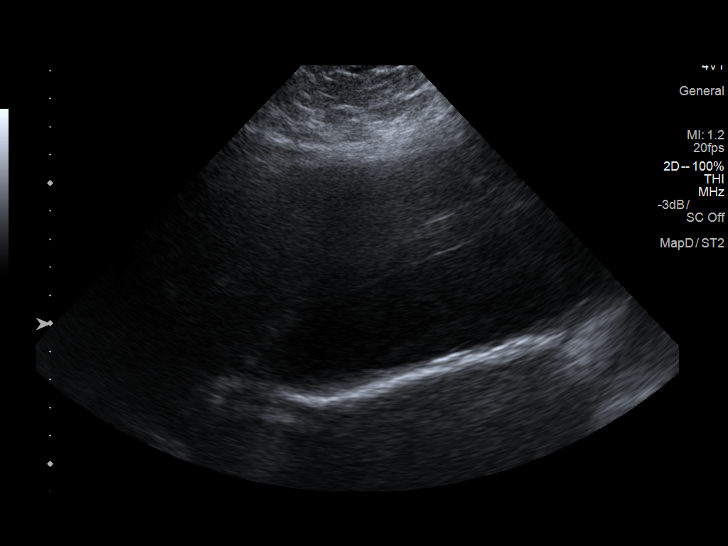
[im 5/23]
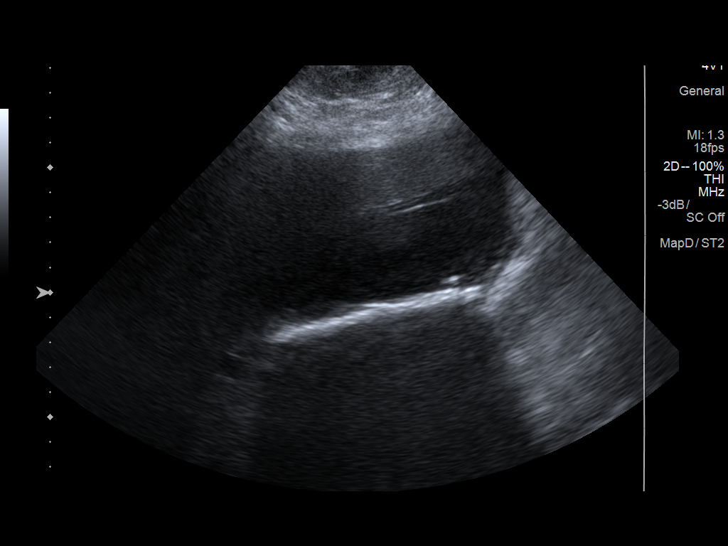
[im 6/23]
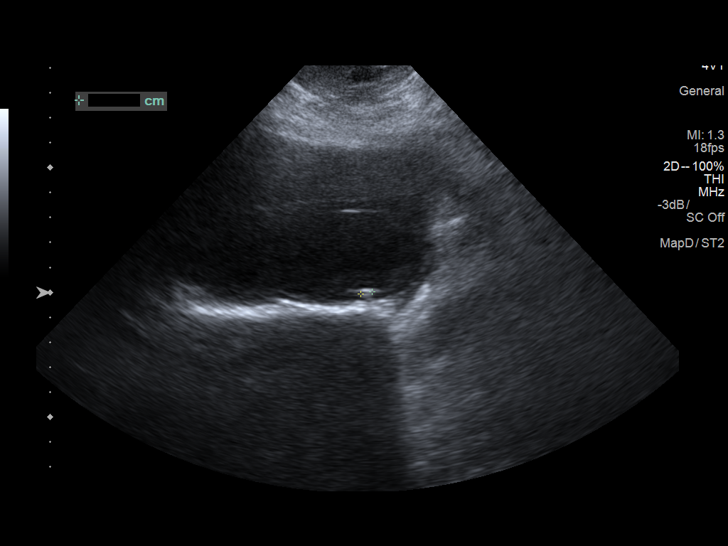
[im 8/23]
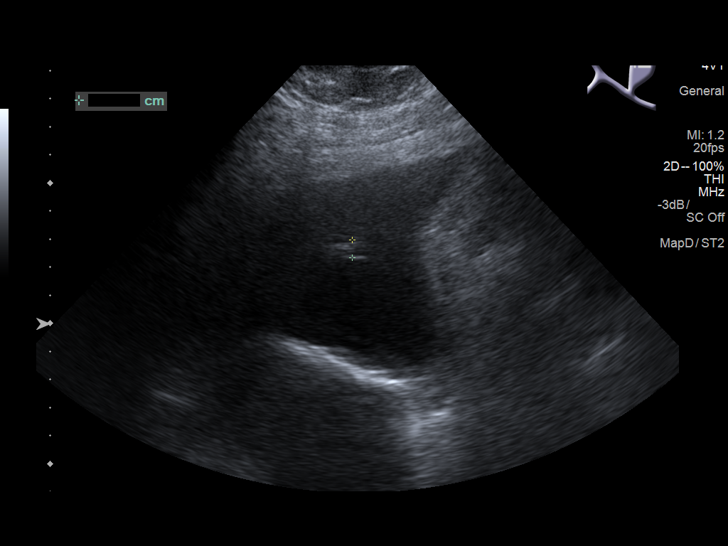
[im 10/23]
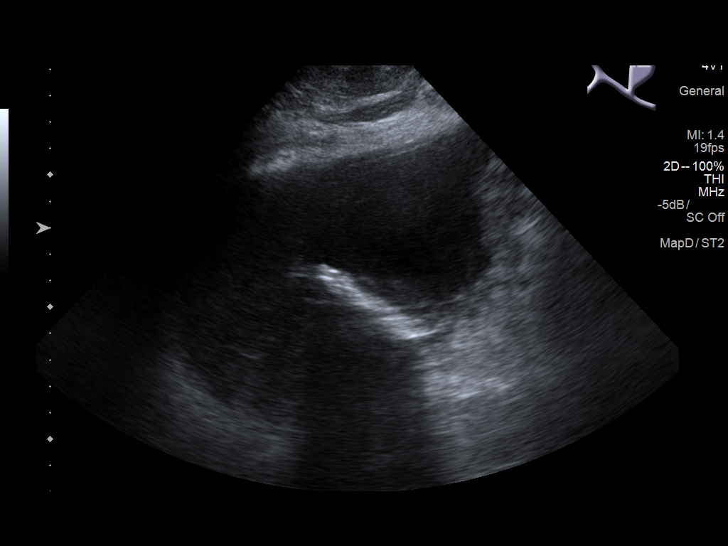
[im 11/23]
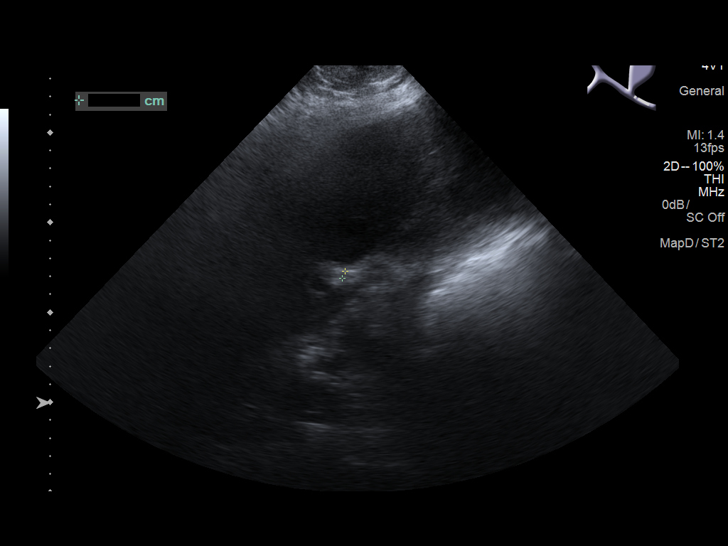
[im 13/23]
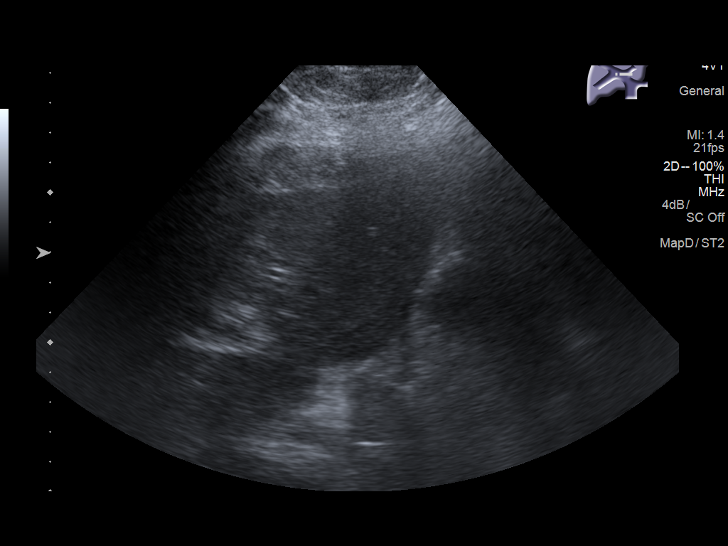
[im 14/23]
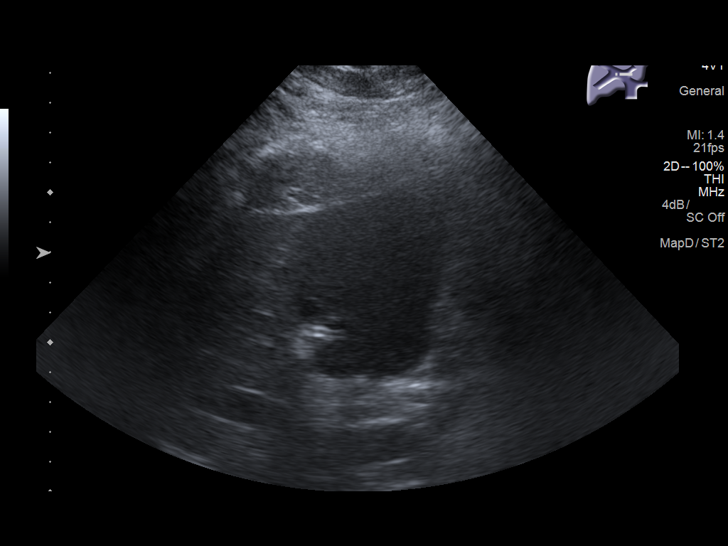
[im 16/23]
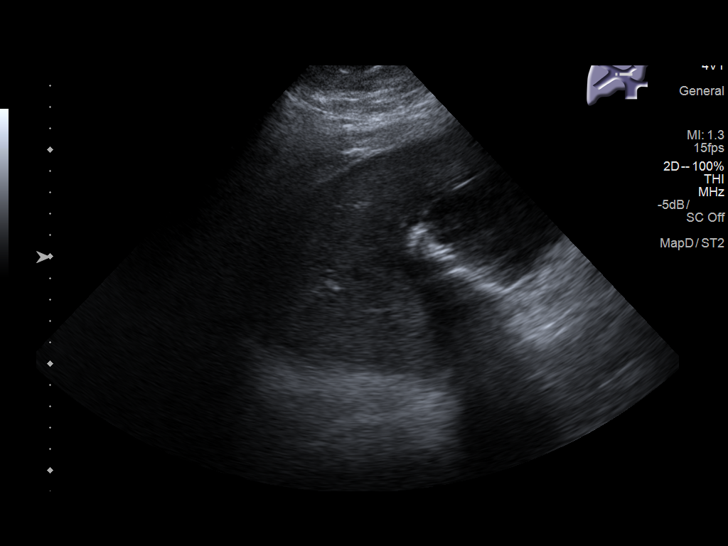
[im 18/23]
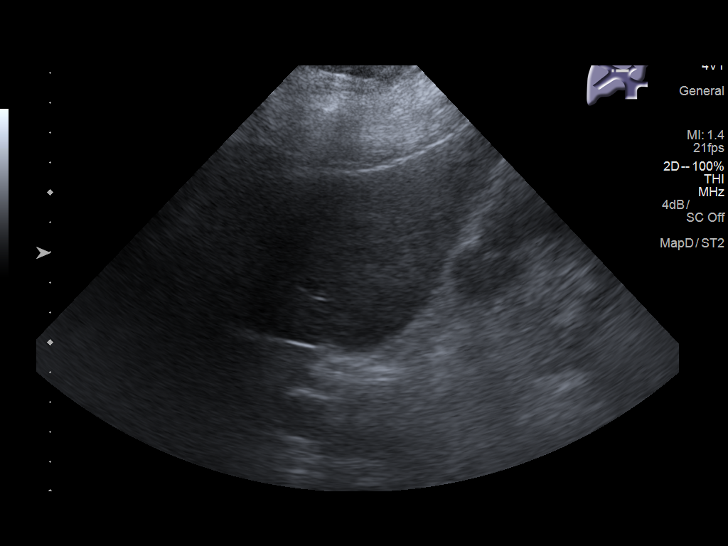
[im 19/23]
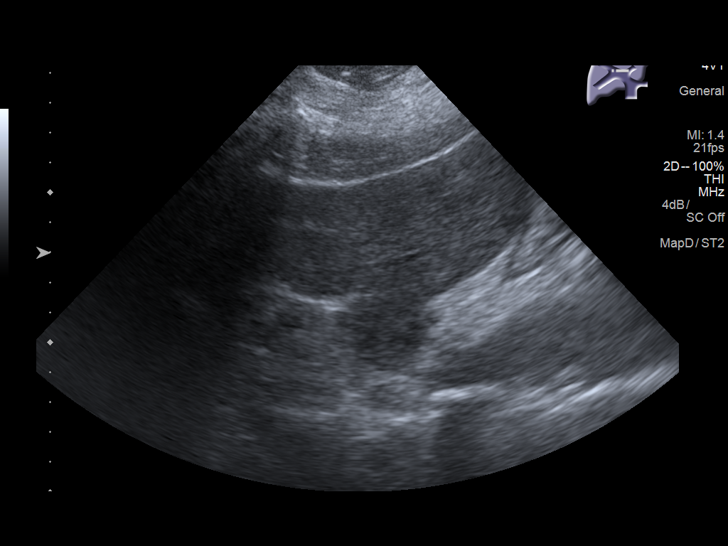
[im 21/23]
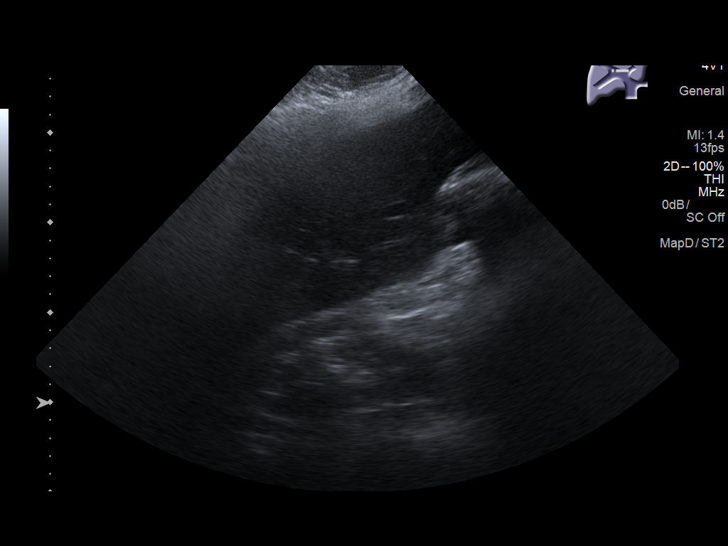
[im 23/23]
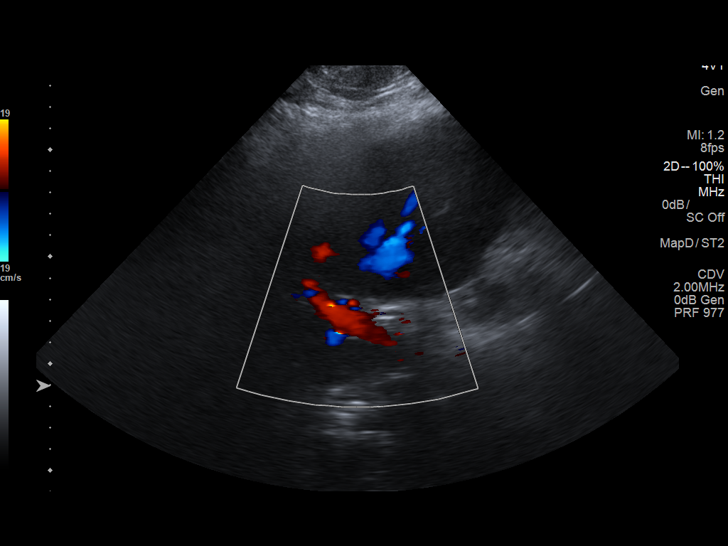

[14 of 23 positions shown; findings below may reference images not displayed]

FINDINGS: Gallbladder:

Distended gallbladder with dependent calculi. Mural thickening up to
6 mm. Positive sonographic Magdalena is noted. The largest
calcification measures 4.6 mm. There is biliary sludge admixed with
the stones.

Common bile duct:

Diameter: 4.3 mm and normal without choledocholithiasis.

Liver:

Suboptimal assessment of the liver due to patient inability to move
appropriately for optimal imaging. No definite space-occupying mass.
Portal vein is patent on color Doppler imaging with normal direction
of blood flow towards the liver.
IMPRESSION: Findings suggest acute cholecystitis with biliary sludge and mobile
calculi noted. Positive sonographic Magdalena with gallbladder
distention and mural thickening of the gallbladder wall to 6 mm in
thickness is noted.
